# Patient Record
Sex: Male | Born: 1945 | Race: White | Hispanic: No | Marital: Married | Smoking: Former smoker
Health system: Southern US, Community
[De-identification: ages and names within clinical notes are randomized; demographics above are authoritative.]

## PROBLEM LIST (undated history)

## (undated) DIAGNOSIS — I472 Ventricular tachycardia, unspecified: Secondary | ICD-10-CM

## (undated) DIAGNOSIS — E119 Type 2 diabetes mellitus without complications: Secondary | ICD-10-CM

## (undated) DIAGNOSIS — F419 Anxiety disorder, unspecified: Secondary | ICD-10-CM

## (undated) DIAGNOSIS — E785 Hyperlipidemia, unspecified: Secondary | ICD-10-CM

## (undated) DIAGNOSIS — R0902 Hypoxemia: Secondary | ICD-10-CM

## (undated) DIAGNOSIS — J449 Chronic obstructive pulmonary disease, unspecified: Secondary | ICD-10-CM

## (undated) DIAGNOSIS — I10 Essential (primary) hypertension: Secondary | ICD-10-CM

## (undated) DIAGNOSIS — M1990 Unspecified osteoarthritis, unspecified site: Secondary | ICD-10-CM

## (undated) DIAGNOSIS — I471 Supraventricular tachycardia: Secondary | ICD-10-CM

## (undated) DIAGNOSIS — R0600 Dyspnea, unspecified: Secondary | ICD-10-CM

## (undated) DIAGNOSIS — I509 Heart failure, unspecified: Secondary | ICD-10-CM

## (undated) DIAGNOSIS — G7000 Myasthenia gravis without (acute) exacerbation: Secondary | ICD-10-CM

## (undated) HISTORY — DX: Myasthenia gravis without (acute) exacerbation: G70.00

## (undated) HISTORY — PX: CATARACT EXTRACTION: SUR2

## (undated) HISTORY — PX: OTHER SURGICAL HISTORY: SHX169

## (undated) HISTORY — DX: Supraventricular tachycardia: I47.1

## (undated) HISTORY — DX: Ventricular tachycardia, unspecified: I47.20

## (undated) HISTORY — PX: RETINAL DETACHMENT SURGERY: SHX105

## (undated) HISTORY — DX: Hyperlipidemia, unspecified: E78.5

## (undated) HISTORY — DX: Heart failure, unspecified: I50.9

## (undated) HISTORY — DX: Hypoxemia: R09.02

## (undated) HISTORY — PX: TONSILLECTOMY AND ADENOIDECTOMY: SUR1326

## (undated) HISTORY — DX: Anxiety disorder, unspecified: F41.9

## (undated) HISTORY — DX: Essential (primary) hypertension: I10

## (undated) HISTORY — PX: EYE SURGERY: SHX253

## (undated) HISTORY — PX: SKIN GRAFT: SHX250

## (undated) HISTORY — DX: Ventricular tachycardia: I47.2

---

## 2012-04-20 ENCOUNTER — Emergency Department: Payer: Self-pay | Admitting: Emergency Medicine

## 2012-05-08 LAB — HM COLONOSCOPY

## 2014-05-18 DIAGNOSIS — L501 Idiopathic urticaria: Secondary | ICD-10-CM | POA: Insufficient documentation

## 2015-05-07 DIAGNOSIS — Z0000 Encounter for general adult medical examination without abnormal findings: Secondary | ICD-10-CM | POA: Diagnosis not present

## 2015-08-17 ENCOUNTER — Ambulatory Visit (INDEPENDENT_AMBULATORY_CARE_PROVIDER_SITE_OTHER): Payer: Medicare Other | Admitting: Family Medicine

## 2015-08-17 ENCOUNTER — Encounter: Payer: Self-pay | Admitting: Family Medicine

## 2015-08-17 VITALS — BP 138/72 | HR 56 | Temp 98.4°F | Resp 16 | Wt 216.0 lb

## 2015-08-17 DIAGNOSIS — F32A Depression, unspecified: Secondary | ICD-10-CM | POA: Insufficient documentation

## 2015-08-17 DIAGNOSIS — M25511 Pain in right shoulder: Secondary | ICD-10-CM | POA: Diagnosis not present

## 2015-08-17 DIAGNOSIS — F4310 Post-traumatic stress disorder, unspecified: Secondary | ICD-10-CM | POA: Insufficient documentation

## 2015-08-17 DIAGNOSIS — N059 Unspecified nephritic syndrome with unspecified morphologic changes: Secondary | ICD-10-CM | POA: Insufficient documentation

## 2015-08-17 DIAGNOSIS — M79605 Pain in left leg: Secondary | ICD-10-CM | POA: Diagnosis not present

## 2015-08-17 DIAGNOSIS — M79604 Pain in right leg: Secondary | ICD-10-CM | POA: Diagnosis not present

## 2015-08-17 DIAGNOSIS — R413 Other amnesia: Secondary | ICD-10-CM | POA: Insufficient documentation

## 2015-08-17 DIAGNOSIS — F419 Anxiety disorder, unspecified: Secondary | ICD-10-CM | POA: Insufficient documentation

## 2015-08-17 DIAGNOSIS — I10 Essential (primary) hypertension: Secondary | ICD-10-CM | POA: Insufficient documentation

## 2015-08-17 DIAGNOSIS — F17210 Nicotine dependence, cigarettes, uncomplicated: Secondary | ICD-10-CM | POA: Insufficient documentation

## 2015-08-17 DIAGNOSIS — M9979 Connective tissue and disc stenosis of intervertebral foramina of abdomen and other regions: Secondary | ICD-10-CM | POA: Insufficient documentation

## 2015-08-17 DIAGNOSIS — J302 Other seasonal allergic rhinitis: Secondary | ICD-10-CM | POA: Insufficient documentation

## 2015-08-17 DIAGNOSIS — G2581 Restless legs syndrome: Secondary | ICD-10-CM

## 2015-08-17 DIAGNOSIS — K649 Unspecified hemorrhoids: Secondary | ICD-10-CM | POA: Insufficient documentation

## 2015-08-17 DIAGNOSIS — F329 Major depressive disorder, single episode, unspecified: Secondary | ICD-10-CM | POA: Insufficient documentation

## 2015-08-17 DIAGNOSIS — K219 Gastro-esophageal reflux disease without esophagitis: Secondary | ICD-10-CM | POA: Insufficient documentation

## 2015-08-17 MED ORDER — HYDROCODONE-ACETAMINOPHEN 5-325 MG PO TABS: 1.0000 | ORAL_TABLET | Freq: Four times a day (QID) | ORAL | Status: DC | PRN

## 2015-08-17 MED ORDER — ROPINIROLE HCL 0.25 MG PO TABS: 0.2500 mg | ORAL_TABLET | Freq: Every day | ORAL | Status: DC

## 2015-08-17 NOTE — Progress Notes (Signed)
Subjective:    Patient ID: Joseph Macdonald, male    DOB: Feb 09, 1946, 69 y.o.   MRN: [id number]  Leg Pain  The incident occurred more than 1 week ago. There was no injury mechanism. The pain is present in the left leg and right leg. The pain has been constant since onset. Associated symptoms include tingling (Right hand.  ). Pertinent negatives include no inability to bear weight or numbness. The treatment provided no relief.  Shoulder Pain  The pain is present in the right shoulder and right arm. This is a new problem. The current episode started 1 to 4 weeks ago. There has been no history of extremity trauma. The problem occurs constantly. The quality of the pain is described as aching. The pain is at a severity of 5/10 (Can go as high as 10/10 especially when resting. ). The pain is moderate. Associated symptoms include tingling (Right hand.  ). Pertinent negatives include no fever, inability to bear weight, itching, joint locking, joint swelling, limited range of motion, numbness or stiffness. The treatment provided no relief.   Does see doctor at [place].  Previously had pain in other shoulder. Started on Gabapentin and make him feel Zombie like, blurry. Did have HgbA1c checked and was pre-diabetic. Was ok in April.    Had trouble with gabapentin.  Took Tramadol and Naproxen and Gabapentin together.  Slurred speech.  Drunk like.  Went away. No other symptoms.    Was previously on hydrocodone.  Had to sign for prescription. Did not get if was not home. Was a real hassle. So stopped. That is when he switched to Tramadol and Naproxen.     Patient Active Problem List   Diagnosis Date Noted  . Anxiety 08/17/2015  . 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 Clinical depression 08/17/2015  . Acid reflux 08/17/2015  . Hemorrhoid 08/17/2015  . BP (high blood pressure) 08/17/2015  . Bad memory 08/17/2015  . 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 Allergic  rhinitis, seasonal 08/17/2015  . 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 Hypertension Mother   . Thyroid disease Mother   . Kidney disease Mother   . Arthritis Mother   . Stroke Father   . Heart disease Father 91  . Arthritis Father   . Arthritis Other    Social History   Social History  . Marital Status: Married    Spouse Name: N/A  . Number of Children: 1  . Years of Education: H/S   Occupational History  . Retired Building surveyor    Social History Main Topics  . Smoking status: Current Every Day Smoker    Types: Pipe, Cigarettes  . Smokeless tobacco: Former Systems developer  . Alcohol Use: Yes     Comment: Only occasionally  . Drug Use: No  . Sexual Activity: Not on file   Other Topics Concern  . Not on file   Social History Narrative  . No narrative on file   Past Surgical History  Procedure Laterality Date  . Skin graft      Behind left knee  . Tonsillectomy and adenoidectomy     No Known Allergies BP 138/72 mmHg  Pulse 56  Temp(Src) 98.4 F (36.9 C) (Oral)  Resp 16  Wt 216 lb (97.977 kg)    Review of Systems  Constitutional: Positive for fatigue (Pt is not sleeping well secondary to pain.). Negative  for fever, chills, diaphoresis, activity change, appetite change and unexpected weight change.  Respiratory: Negative for apnea, cough, choking, chest tightness, shortness of breath, wheezing and stridor.   Cardiovascular: Positive for leg swelling (ankles swell occasionally). Negative for chest pain and palpitations.  Gastrointestinal: Positive for nausea. Negative for vomiting, abdominal pain, diarrhea, constipation, blood in stool, abdominal distention, anal bleeding and rectal pain.  Endocrine: Negative for cold intolerance, heat intolerance, polydipsia, polyphagia and polyuria.  Musculoskeletal: Positive for myalgias, arthralgias and gait problem. Negative for back pain, joint swelling, stiffness,  neck pain and neck stiffness.  Skin: Negative for itching.  Neurological: Positive for tingling (Right hand.  ) and speech difficulty (Happened Saturday evening but has resolved. ). Negative for dizziness, tremors, seizures, syncope, facial asymmetry, weakness, light-headedness, numbness and headaches.  Psychiatric/Behavioral: Positive for sleep disturbance. Negative for confusion.       Objective:   Physical Exam  Constitutional: He is oriented to person, place, and time. He appears well-developed and well-nourished.  Neurological: He is alert and oriented to person, place, and time.  Psychiatric: He has a normal mood and affect. His behavior is normal. Judgment and thought content normal.   BP 138/72 mmHg  Pulse 56  Temp(Src) 98.4 F (36.9 C) (Oral)  Resp 16  Wt 216 lb (97.977 kg)      Assessment & Plan:   1. Leg pain, bilateral Worsening. Suspect not well controlled on current medication and having significant side effects. Will stop Ultram and Gabapentin and restart hydrocodone. Pain contract signed today.  Will check urine tox screen at follow up ov.    2. Right shoulder pain Treatment as above.  3. Restless leg syndrome New problem. Worsening. Has not been on medication for this previously. Will try Requip and recheck in 4 weeks.    Patient was seen and examined by Jerrell Belfast, MD, and note scribed, in part,  by Ashley Royalty, [place].  I have reviewed the document for accuracy and completeness and I agree with above. Jerrell Belfast, MD   Margarita Rana, MD

## 2015-08-21 ENCOUNTER — Ambulatory Visit (INDEPENDENT_AMBULATORY_CARE_PROVIDER_SITE_OTHER): Payer: Medicare Other

## 2015-08-21 DIAGNOSIS — Z23 Encounter for immunization: Secondary | ICD-10-CM | POA: Diagnosis not present

## 2015-08-26 ENCOUNTER — Other Ambulatory Visit: Payer: Self-pay | Admitting: Family Medicine

## 2015-08-26 MED ORDER — PREDNISONE 10 MG PO TABS: ORAL_TABLET | ORAL | Status: DC

## 2015-08-26 NOTE — Telephone Encounter (Signed)
Pt states he is having swelling and has red places on his hands from being allergic to grass.  Pt is requesting  A Rx for Prednisone 10mg  if possible.  Pt states Dr Venia Minks has given him this before and he does not need an appt.  Joseph Macdonald.  UY#[phone]/[id number]

## 2015-08-26 NOTE — Telephone Encounter (Signed)
Pt advised as directed below.   Thanks,   -Laura  

## 2015-08-26 NOTE — Telephone Encounter (Signed)
Sent rx. Ov if not improved. Thanks.

## 2015-09-17 ENCOUNTER — Ambulatory Visit (INDEPENDENT_AMBULATORY_CARE_PROVIDER_SITE_OTHER): Payer: Medicare Other | Admitting: Family Medicine

## 2015-09-17 ENCOUNTER — Encounter: Payer: Self-pay | Admitting: Family Medicine

## 2015-09-17 VITALS — BP 126/86 | HR 60 | Temp 98.0°F | Resp 16 | Wt 214.0 lb

## 2015-09-17 DIAGNOSIS — R531 Weakness: Secondary | ICD-10-CM | POA: Insufficient documentation

## 2015-09-17 DIAGNOSIS — M79605 Pain in left leg: Secondary | ICD-10-CM

## 2015-09-17 DIAGNOSIS — G7000 Myasthenia gravis without (acute) exacerbation: Secondary | ICD-10-CM

## 2015-09-17 DIAGNOSIS — G2581 Restless legs syndrome: Secondary | ICD-10-CM | POA: Diagnosis not present

## 2015-09-17 DIAGNOSIS — M79604 Pain in right leg: Secondary | ICD-10-CM

## 2015-09-17 DIAGNOSIS — Z7189 Other specified counseling: Secondary | ICD-10-CM | POA: Diagnosis not present

## 2015-09-17 DIAGNOSIS — G8929 Other chronic pain: Secondary | ICD-10-CM

## 2015-09-17 MED ORDER — HYDROCODONE-ACETAMINOPHEN 5-325 MG PO TABS: 1.0000 | ORAL_TABLET | Freq: Four times a day (QID) | ORAL | Status: DC | PRN

## 2015-09-17 NOTE — Progress Notes (Signed)
Patient ID: Joseph Macdonald, male   DO: 1946-07-17, 69 y.o.   MRN: [id number]        Patient: Joseph Macdonald    DOB: November 29, 1946   69 y.o.   MRN: [id number] Visit Date: 09/17/2015  Today's Provider: Margarita Rana, MD   Chief Complaint  Patient presents with  . Leg Pain   Subjective:    HPI    Follow up for restless leg syndrome  The patient was last seen for this 4 weeks ago. Changes made at last visit include Requip.  He reports excellent compliance with treatment. He feels that condition is Improved. He is not having effects.  Feels that this is better.    Follow up for bilateral leg pain  The patient was last seen for this 4 weeks ago. Changes made at last visit include stop Ultram and Gabapentin and restart Hydrocodone. Doing well.   He reports excellent compliance with treatment. He feels that condition is Improved. He is not having side effects.   Thinks he has myasthenia gravis. Blood work was positive.   Has eye droop and difficulty swallowing.  Wanting on his referral to neurology.    No Known Allergies Previous Medications   AMLODIPINE (NORVASC) 5 MG TABLET    Take 5 mg by mouth daily.    HYDROCHLOROTHIAZIDE (MICROZIDE) 12.5 MG CAPSULE    Take 12.5 mg by mouth daily.   HYDROCODONE-ACETAMINOPHEN (NORCO/VICODIN) 5-325 MG PER TABLET    Take 1 tablet by mouth every 6 (six) hours as needed for moderate pain.   MULTIPLE VITAMIN PO    Take 1 tablet by mouth daily.    NAPROXEN (NAPROSYN) 500 MG TABLET    Take 500 mg by mouth 2 (two) times daily with a meal.    OMEGA-3 FATTY ACIDS (OMEGA 3 PO)    Take 1 capsule by mouth daily.    PREDNISONE (DELTASONE) 10 MG TABLET    6 po for 1 day and then 5 po for 1 day and then 4 po for 1 day and 3 po for 1 day and then 2 po for 1 day and then 1 po for 1 day.   RANITIDINE (ZANTAC) 150 MG CAPSULE    Take 150 mg by mouth every evening.    ROPINIROLE (REQUIP) 0.25 MG TABLET    Take 1 tablet (0.25 mg total) by mouth at  bedtime. 1 a night for one week and then 2 at night.   VITAMIN B-12 (CYANOCOBALAMIN) 1000 MCG TABLET    Take 1,000 mcg by mouth daily.    Review of Systems  Constitutional: Negative.   Respiratory: Negative.   Cardiovascular: Negative.   Gastrointestinal: Negative.   Genitourinary: Negative.   Neurological: Negative.   Psychiatric/Behavioral: Negative.     Social History  Substance Use Topics  . Smoking status: Current Every Day Smoker    Types: Pipe, Cigarettes  . Smokeless tobacco: Former Systems developer  . Alcohol Use: Yes     Comment: Only occasionally   Objective:   BP 126/86 mmHg  Pulse 60  Temp(Src) 98 F (36.7 C) (Oral)  Resp 16  Wt 214 lb (97.07 kg)  Physical Exam  Constitutional: He is oriented to person, place, and time. He appears well-developed and well-nourished.  Neurological: He is alert and oriented to person, place, and time.  Has right eye drooping and some slurred speech.    Psychiatric: He has a normal mood and affect.      Assessment & Plan:  1. Restless leg syndrome Stable.  Continue current medication and plan of care.   2. Leg pain, bilateral Taking  Hydrocodone twice a day.  Will refill today.  Did leave urine for drug screen today.    3. Encounter for chronic pain management Left sample today.  - Pain Management Screening Profile (10S)  4. Myasthenia gravis ([place]) Continue work up and  treatment at [place].      Margarita Rana, MD  [facility]

## 2015-09-18 LAB — PMP SCREEN PROFILE (10S), URINE
Amphetamine Screen, Ur: NEGATIVE ng/mL
Barbiturate Screen, Ur: NEGATIVE ng/mL
Benzodiazepine Screen, Urine: NEGATIVE ng/mL
Cannabinoids Ur Ql Scn: NEGATIVE ng/mL
Cocaine(Metab.)Screen, Urine: NEGATIVE ng/mL
Creatinine(Crt), U: 71.9 mg/dL (ref 20.0–300.0)
Methadone Scn, Ur: NEGATIVE ng/mL
Opiate Scrn, Ur: POSITIVE ng/mL
Oxycodone+Oxymorphone Ur Ql Scn: NEGATIVE ng/mL
PCP Scrn, Ur: NEGATIVE ng/mL
Ph of Urine: 6.8 (ref 4.5–8.9)
Propoxyphene, Screen: NEGATIVE ng/mL

## 2015-09-20 ENCOUNTER — Telehealth: Payer: Self-pay

## 2015-09-20 NOTE — Telephone Encounter (Signed)
Advised pt of lab results. Pt verbally acknowledges understanding. Yazmen Briones Drozdowski, CMA   

## 2015-09-20 NOTE — Telephone Encounter (Signed)
-----   Message from Margarita Rana, MD sent at 09/18/2015 12:02 PM EDT ----- Tox screen as expected.  Thanks.

## 2015-09-20 NOTE — Telephone Encounter (Signed)
LMTCB 09/20/2015   Thanks,   -Mickel Baas

## 2015-10-12 ENCOUNTER — Other Ambulatory Visit: Payer: Self-pay | Admitting: Physician Assistant

## 2015-10-12 DIAGNOSIS — K769 Liver disease, unspecified: Secondary | ICD-10-CM

## 2015-10-20 ENCOUNTER — Ambulatory Visit
Admission: RE | Admit: 2015-10-20 | Discharge: 2015-10-20 | Disposition: A | Discharge status: Home / Self Care | Payer: Non-veteran care | Source: Ambulatory Visit | Attending: Physician Assistant | Admitting: Physician Assistant

## 2015-10-20 DIAGNOSIS — K769 Liver disease, unspecified: Secondary | ICD-10-CM

## 2015-10-20 MED ORDER — GADOBENATE DIMEGLUMINE 529 MG/ML IV SOLN
20.0000 mL | Freq: Once | INTRAVENOUS | Status: AC | PRN
  Administered 2015-10-20: 20 mL via INTRAVENOUS

## 2015-12-21 LAB — BASIC METABOLIC PANEL
Creatinine: 0.9 mg/dL (ref 0.6–1.3)
Glucose: 142 mg/dL

## 2015-12-21 LAB — LIPID PANEL
CHOLESTEROL: 171 mg/dL (ref 0–200)
HDL: 89 mg/dL — AB (ref 35–70)

## 2015-12-21 LAB — HEMOGLOBIN A1C: HEMOGLOBIN A1C: 6.3

## 2016-01-17 ENCOUNTER — Telehealth: Payer: Self-pay | Admitting: Family Medicine

## 2016-01-17 ENCOUNTER — Encounter: Payer: Self-pay | Admitting: Family Medicine

## 2016-01-17 ENCOUNTER — Ambulatory Visit (INDEPENDENT_AMBULATORY_CARE_PROVIDER_SITE_OTHER): Payer: PPO | Admitting: Family Medicine

## 2016-01-17 VITALS — BP 136/74 | HR 68 | Temp 98.0°F | Resp 20 | Wt 216.0 lb

## 2016-01-17 DIAGNOSIS — F1190 Opioid use, unspecified, uncomplicated: Secondary | ICD-10-CM | POA: Insufficient documentation

## 2016-01-17 DIAGNOSIS — M9979 Connective tissue and disc stenosis of intervertebral foramina of abdomen and other regions: Secondary | ICD-10-CM | POA: Diagnosis not present

## 2016-01-17 DIAGNOSIS — B356 Tinea cruris: Secondary | ICD-10-CM | POA: Diagnosis not present

## 2016-01-17 MED ORDER — HYDROCODONE-ACETAMINOPHEN 5-325 MG PO TABS: 1.0000 | ORAL_TABLET | Freq: Four times a day (QID) | ORAL | Status: DC | PRN

## 2016-01-17 MED ORDER — TERBINAFINE HCL 1 % EX CREA: 1.0000 "application " | TOPICAL_CREAM | Freq: Two times a day (BID) | CUTANEOUS | Status: DC

## 2016-01-17 NOTE — Telephone Encounter (Signed)
Added diagnosis for urine tox screen.

## 2016-01-17 NOTE — Progress Notes (Signed)
Subjective:    Patient ID: Joseph Macdonald, male    DOB: [birth year], 70 y.o.   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 The problem has been gradually worsening since onset. The affected locations include the groin. The rash is characterized by itchiness and redness. He was exposed to nothing. Pertinent negatives include no anorexia, cough, diarrhea, fatigue, fever, joint pain, nail changes, shortness of breath or vomiting. Treatments tried: Tinactin cream. The treatment provided mild relief.      Review of Systems  Constitutional: Negative for fever and fatigue.  Respiratory: Negative for cough and shortness of breath.   Gastrointestinal: Negative for vomiting, diarrhea and anorexia.  Musculoskeletal: Negative for joint pain.  Skin: Positive for rash. Negative for nail changes.   BP 136/74 mmHg  Pulse 68  Temp(Src) 98 F (36.7 C) (Oral)  Resp 20  Wt 216 lb (97.977 kg)   Patient Active Problem List   Diagnosis Date Noted  . Myasthenia gravis ([place]) 09/17/2015  . Anxiety 08/17/2015  . 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 Clinical depression 08/17/2015  . Acid reflux 08/17/2015  . Hemorrhoid 08/17/2015  . BP (high blood pressure) 08/17/2015  . Bad memory 08/17/2015  . 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 Allergic rhinitis, seasonal 08/17/2015  . 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 Restless leg syndrome 08/17/2015   No past medical history on file. Current Outpatient Prescriptions on File Prior to Visit  Medication Sig  . amLODipine (NORVASC) 5 MG tablet Take 5 mg by mouth daily.   . hydrochlorothiazide (MICROZIDE) 12.5 MG capsule Take 25 mg by mouth daily.   . ranitidine (ZANTAC) 150 MG capsule Take 150 mg by mouth every evening.   . vitamin B-12 (CYANOCOBALAMIN) 1000 MCG tablet Take 1,000 mcg by mouth daily.   No current  facility-administered medications on file prior to visit.   No Known Allergies Past Surgical History  Procedure Laterality Date  . Skin graft      Behind left knee  . Tonsillectomy and adenoidectomy     Social History   Social History  . Marital Status: Married    Spouse Name: N/A  . Number of Children: 1  . Years of Education: H/S   Occupational History  . Retired Building surveyor    Social History Main Topics  . Smoking status: Current Every Day Smoker -- 0.25 packs/day    Types: Pipe, Cigarettes  . Smokeless tobacco: Former Systems developer  . Alcohol Use: Yes     Comment: Only occasionally  . Drug Use: No  . Sexual Activity: Not on file   Other Topics Concern  . Not on file   Social History Narrative   Family History  Problem Relation Age of Onset  . Hypertension Mother   . Thyroid disease Mother   . Kidney disease Mother   . Arthritis Mother   . Stroke Father   . Heart disease Father 88  . Arthritis Father   . Arthritis Other        Objective:   Physical Exam  Constitutional: He is oriented to person, place, and time. He appears well-developed and well-nourished.  Neurological: He is alert and oriented to person, place, and time.  Skin: Rash noted.  Area of hyperpigmentation in inguinal region with sharp edges. Some satellite lesions.    Psychiatric: He has a normal mood and affect.  His behavior is normal.   BP 136/74 mmHg  Pulse 68  Temp(Src) 98 F (36.7 C) (Oral)  Resp 20  Wt 216 lb (97.977 kg)     Assessment & Plan:  1. Tinea cruris Worsening. Failed Tinactin. Will try Lamisil as below. May need to consider oral antifungals if Lamisil fails. - terbinafine (LAMISIL) 1 % cream; Apply 1 application topically 2 (two) times daily.  Dispense: 42 g; Refill: 2  2. Narrowing of intervertebral disc space Stable. Refill medications as below. - HYDROcodone-acetaminophen (NORCO/VICODIN) 5-325 MG tablet; Take 1 tablet by mouth every 6 (six) hours as needed  for moderate pain.  Dispense: 120 tablet; Refill: 0   Patient seen and examined by Jerrell Belfast, MD, and note scribed by Renaldo Fiddler, CMA.   I have reviewed the document for accuracy and completeness and I agree with above. Jerrell Belfast, MD    Margarita Rana, MD

## 2016-05-15 ENCOUNTER — Encounter: Payer: Self-pay | Admitting: Family Medicine

## 2016-05-15 ENCOUNTER — Ambulatory Visit (INDEPENDENT_AMBULATORY_CARE_PROVIDER_SITE_OTHER): Payer: PPO | Admitting: Family Medicine

## 2016-05-15 VITALS — BP 110/60 | HR 60 | Temp 98.0°F | Resp 16 | Ht 65.75 in | Wt 216.0 lb

## 2016-05-15 DIAGNOSIS — Z0000 Encounter for general adult medical examination without abnormal findings: Secondary | ICD-10-CM | POA: Diagnosis not present

## 2016-05-15 DIAGNOSIS — M9979 Connective tissue and disc stenosis of intervertebral foramina of abdomen and other regions: Secondary | ICD-10-CM | POA: Diagnosis not present

## 2016-05-15 DIAGNOSIS — G2581 Restless legs syndrome: Secondary | ICD-10-CM

## 2016-05-15 MED ORDER — HYDROCODONE-ACETAMINOPHEN 5-325 MG PO TABS: 1.0000 | ORAL_TABLET | Freq: Four times a day (QID) | ORAL | Status: DC | PRN

## 2016-05-15 MED ORDER — CLONAZEPAM 0.5 MG PO TABS: 0.5000 mg | ORAL_TABLET | Freq: Every day | ORAL | Status: DC

## 2016-05-15 NOTE — Progress Notes (Signed)
Patient ID: Joseph Macdonald, male   DOB: [birth year], 70 y.o.   MRN: [id number]       Patient: Joseph Macdonald, Male    DOB: [birth year], 70 y.o.   MRN: [id number] Visit Date: 05/15/2016  Today's Provider: Margarita Rana, MD   Chief Complaint  Patient presents with  . Medicare Wellness   Subjective:    Annual wellness visit [name] is a 70 y.o. male. He feels well. He reports exercising 2 days a week. He reports he is sleeping fairly well. Brought his labs from [place] for review today.   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 Helps pain. No problems with addiction or overuse.  Does also want to discuss RLS. Requip did not help at all.  Has an ache and has to move his legs.  Can not really describe it. Also depression and PTSD under good control.   Review of Systems  Constitutional: Negative.   HENT: Positive for rhinorrhea and sinus pressure.   Eyes: Negative.   Respiratory: Negative.   Cardiovascular: Negative.   Gastrointestinal: Positive for diarrhea.  Endocrine: Positive for polyuria.  Genitourinary: Negative.   Musculoskeletal: Positive for back pain.  Skin: Negative.   Allergic/Immunologic: Positive for environmental allergies.  Neurological: Negative.   Hematological: Bruises/bleeds easily.  Psychiatric/Behavioral: Negative.     Social History   Social History  . Marital Status: Married    Spouse Name: N/A  . Number of Children: 1  . Years of Education: H/S   Occupational History  . Retired Building surveyor    Social History Main Topics  . Smoking status: Current Every Day Smoker -- 0.25 packs/day    Types: Pipe, Cigarettes  . Smokeless tobacco: Former Systems developer  . Alcohol Use: Yes     Comment: Only occasionally  . Drug Use: No  . Sexual Activity: Not on file   Other Topics Concern  . Not on file   Social History Narrative     History reviewed. No pertinent past medical history.   Patient Active Problem List   Diagnosis Date Noted  . Chronic narcotic use 01/17/2016  . Myasthenia gravis ([place]) 09/17/2015  . Anxiety 08/17/2015  . 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 Clinical depression 08/17/2015  . Acid reflux 08/17/2015  . Hemorrhoid 08/17/2015  . BP (high blood pressure) 08/17/2015  . Bad memory 08/17/2015  . 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 Allergic rhinitis, seasonal 08/17/2015  . 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 Restless leg syndrome 08/17/2015    Past Surgical History  Procedure Laterality Date  . Skin graft      Behind left knee  . Tonsillectomy and adenoidectomy      His family history includes Arthritis in his father, mother, and other; Heart disease (age of onset: 82) in his father; Hypertension in his mother; Kidney disease in his mother; Stroke in his father; Thyroid disease in his mother.    Previous Medications   AMLODIPINE (NORVASC) 5 MG TABLET    Take 5 mg by mouth daily.    HYDROCHLOROTHIAZIDE (MICROZIDE) 12.5 MG CAPSULE    Take 25 mg by mouth daily.    HYDROCODONE-ACETAMINOPHEN (NORCO/VICODIN) 5-325 MG TABLET    Take 1 tablet by mouth every 6 (six) hours as needed for moderate pain.   PREDNISONE (DELTASONE) 20 MG TABLET    Take 30 mg  by mouth daily with breakfast.   PYRIDOSTIGMINE (MESTINON) 60 MG TABLET    Take 120 mg by mouth daily.   RANITIDINE (ZANTAC) 150 MG CAPSULE    Take 150 mg by mouth every evening.    TERBINAFINE (LAMISIL) 1 % CREAM    Apply 1 application topically 2 (two) times daily.   TOLNAFTATE (TINACTIN) 1 % CREAM    Apply 1 application topically 2 (two) times daily.   VITAMIN B-12 (CYANOCOBALAMIN) 1000 MCG TABLET    Take 1,000 mcg by mouth daily.    Patient Care Team: Margarita Rana, MD as PCP - General (Family Medicine)     Objective:   Vitals: BP 110/60 mmHg  Pulse 60  Temp(Src) 98 F  (36.7 C) (Oral)  Resp 16  Ht 5' 5.75" (1.67 m)  Wt 216 lb (97.977 kg)  BMI 35.13 kg/m2  SpO2 95%  Physical Exam  Constitutional: He is oriented to person, place, and time. He appears well-developed and well-nourished.  HENT:  Head: Normocephalic and atraumatic.  Right Ear: External ear normal.  Nose: Nose normal.  Mouth/Throat: Oropharynx is clear and moist.  Eyes: Conjunctivae are normal. Pupils are equal, round, and reactive to light.  Neck: Normal range of motion. Neck supple.  Cardiovascular: Normal rate, regular rhythm and normal heart sounds.   Pulmonary/Chest: Effort normal and breath sounds normal.  Abdominal: Soft. Bowel sounds are normal.  Musculoskeletal: Normal range of motion.  Neurological: He is alert and oriented to person, place, and time.  Skin: Skin is warm and dry.  Psychiatric: He has a normal mood and affect. His behavior is normal. Judgment and thought content normal.    Activities of Daily Living In your present state of health, do you have any difficulty performing the following activities: 05/15/2016  Hearing? N  Vision? N  Difficulty concentrating or making decisions? N  Walking or climbing stairs? N  Dressing or bathing? N  Doing errands, shopping? N    Fall Risk Assessment Fall Risk  05/15/2016  Falls in the past year? No     Depression Screen PHQ 2/9 Scores 05/15/2016  PHQ - 2 Score 0    Cognitive Testing - 6-CIT  Correct? Score   What year is it? yes 0 0 or 4  What month is it? yes 0 0 or 3  Memorize:    Joseph Macdonald,  43,  [address],  [place],      What time is it? (within 1 hour) yes 0 0 or 3  Count backwards from 20 yes 0 0, 2, or 4  Name the months of the year yes 0 0, 2, or 4  Repeat name & address above yes 0 0, 2, 4, 6, 8, or 10       TOTAL SCORE  0/28   Interpretation:  Normal  Normal (0-7) Abnormal (8-28)       Assessment & Plan:     Annual Wellness Visit  Reviewed patient's Family Medical History Reviewed and  updated list of patient's medical providers Assessment of cognitive impairment was done Assessed patient's functional ability Established a written schedule for health screening [place] Completed and Reviewed  Exercise Activities and Dietary recommendations Goals    None      Immunization History  Administered Date(s) Administered  . Influenza, High Dose Seasonal PF 08/21/2015      1. Medicare annual wellness visit, subsequent Stable. Patient advised to continue eating healthy and exercise daily.  2. Restless leg  syndrome Recurrent. Worsening. Patient started on clonazepam 0.5 mg as below.  Patient instructed to call back if condition worsens or does not improve.    - clonazePAM (KLONOPIN) 0.5 MG tablet; Take 1 tablet (0.5 mg total) by mouth daily.  Dispense: 90 tablet; Refill: 1  3. Narrowing of intervertebral disc space Stable. Patient to continue current medication and plan of care. Patient to follow-up in 3 months with Dr. Caryn Section. - HYDROcodone-acetaminophen (NORCO/VICODIN) 5-325 MG tablet; Take 1 tablet by mouth every 6 (six) hours as needed for moderate pain.  Dispense: 120 tablet; Refill: 0   Patient seen and examined by Dr. Jerrell Belfast, and note scribed by Philbert Riser. [place], CMA.  I have reviewed the document for accuracy and completeness and I agree with above. Jerrell Belfast, MD   Margarita Rana, MD   ------------------------------------------------------------------------------------------------------------

## 2016-05-16 ENCOUNTER — Encounter: Payer: Self-pay | Admitting: Family Medicine

## 2016-09-04 ENCOUNTER — Ambulatory Visit: Payer: Self-pay | Admitting: Family Medicine

## 2016-09-12 ENCOUNTER — Encounter: Payer: Self-pay | Admitting: Family Medicine

## 2016-09-12 ENCOUNTER — Ambulatory Visit (INDEPENDENT_AMBULATORY_CARE_PROVIDER_SITE_OTHER): Payer: PPO | Admitting: Family Medicine

## 2016-09-12 VITALS — BP 112/70 | HR 67 | Temp 99.4°F | Resp 18 | Wt 221.0 lb

## 2016-09-12 DIAGNOSIS — G2581 Restless legs syndrome: Secondary | ICD-10-CM

## 2016-09-12 DIAGNOSIS — M9979 Connective tissue and disc stenosis of intervertebral foramina of abdomen and other regions: Secondary | ICD-10-CM | POA: Diagnosis not present

## 2016-09-12 DIAGNOSIS — Z23 Encounter for immunization: Secondary | ICD-10-CM

## 2016-09-12 MED ORDER — HYDROCODONE-ACETAMINOPHEN 5-325 MG PO TABS: 1.0000 | ORAL_TABLET | Freq: Four times a day (QID) | ORAL | 0 refills | Status: DC | PRN

## 2016-09-12 MED ORDER — CLONAZEPAM 0.5 MG PO TABS: 0.5000 mg | ORAL_TABLET | Freq: Every day | ORAL | 1 refills | Status: DC

## 2016-09-12 NOTE — Progress Notes (Signed)
Patient: Joseph Macdonald Male    DOB: 10-01-1946   70 y.o.   MRN: [id number] Visit Date: 09/12/2016  Today's Provider: Lelon Huh, MD   Chief Complaint  Patient presents with  . Follow-up   Subjective:    HPI  This is a previous patient of Dr. Venia Minks present today as new patient to me to establish care and follow up on chronic medical problems.   Follow up Narrowing of intervertebral disc space:  Patient was last seen for this problem 4 months ago by Dr. Venia Minks. Patient was stable during that visit and was advised to continue current medication and plan of care. Patient reports good compliance with treatment.  Follow up Restless leg syndrome:  Patient was last seen for this problem 4 months ago. Patient was started on Clonazepam due to recurrent and worsening symptoms. Patient reports good compliance with treatment, good tolerance and good symptom control.   On prednisone 20mg  for myasthenia gravis. Anticipates being on current dose of prednisone until at least January.   Diabetes is followed at [facility] and reports recent A1c was 6.7.       No Known Allergies   Current Outpatient Prescriptions:  .  amLODipine (NORVASC) 5 MG tablet, Take 5 mg by mouth daily. , Disp: , Rfl:  .  clonazePAM (KLONOPIN) 0.5 MG tablet, Take 1 tablet (0.5 mg total) by mouth daily., Disp: 90 tablet, Rfl: 1 .  hydrochlorothiazide (MICROZIDE) 12.5 MG capsule, Take 25 mg by mouth daily. , Disp: , Rfl:  .  HYDROcodone-acetaminophen (NORCO/VICODIN) 5-325 MG tablet, Take 1 tablet by mouth every 6 (six) hours as needed for moderate pain., Disp: 120 tablet, Rfl: 0 .  predniSONE (DELTASONE) 20 MG tablet, Take 20 mg by mouth daily with breakfast. , Disp: , Rfl:  .  pyridostigmine (MESTINON) 60 MG tablet, Take 120 mg by mouth daily., Disp: , Rfl:  .  ranitidine (ZANTAC) 150 MG capsule, Take 150 mg by mouth every evening. , Disp: , Rfl:  .  terbinafine (LAMISIL) 1 % cream, Apply 1 application topically 2  (two) times daily., Disp: 42 g, Rfl: 2 .  tolnaftate (TINACTIN) 1 % cream, Apply 1 application topically 2 (two) times daily., Disp: , Rfl:  .  vitamin B-12 (CYANOCOBALAMIN) 1000 MCG tablet, Take 1,000 mcg by mouth daily., Disp: , Rfl:   Review of Systems  Constitutional: Negative for appetite change, chills and fever.  Respiratory: Negative for chest tightness, shortness of breath and wheezing.   Cardiovascular: Negative for chest pain and palpitations.  Gastrointestinal: Negative for abdominal pain, nausea and vomiting.  Musculoskeletal: Positive for arthralgias and myalgias (in legs; improved since last visit).    Social History  Substance Use Topics  . Smoking status: Current Every Day Smoker    Packs/day: 0.25    Types: Pipe, Cigarettes  . Smokeless tobacco: Former Systems developer  . Alcohol use Yes     Comment: Only occasionally   Objective:   BP 112/70 (BP Location: Right Arm, Patient Position: Sitting, Cuff Size: Large)   Pulse 67   Temp 99.4 F (37.4 C) (Oral)   Resp 18   Wt 221 lb (100.2 kg)   SpO2 95% Comment: room air  BMI 35.94 kg/m   Physical Exam   General Appearance:    Alert, cooperative, no distress  Eyes:    PERRL, conjunctiva/corneas clear, EOM's intact       Lungs:     Clear to auscultation bilaterally, respirations unlabored  Heart:    Regular rate and rhythm  Neurologic:   Awake, alert, oriented x 3. No apparent focal neurological           defect.           Assessment & Plan:     1. Restless leg syndrome Doing well with clonazepam - clonazePAM (KLONOPIN) 0.5 MG tablet; Take 1 tablet (0.5 mg total) by mouth daily.  Dispense: 90 tablet; Refill: 1  2. Narrowing of intervertebral disc space Pain adequately controlled. Continue current medications.   - HYDROcodone-acetaminophen (NORCO/VICODIN) 5-325 MG tablet; Take 1 tablet by mouth every 6 (six) hours as needed for moderate pain.  Dispense: 120 tablet; Refill: 0  3. Need for influenza vaccination  -  Flu vaccine HIGH DOSE PF (Fluzone High dose)        Lelon Huh, MD  [facility]

## 2016-12-26 ENCOUNTER — Other Ambulatory Visit: Payer: Self-pay | Admitting: Family Medicine

## 2016-12-26 DIAGNOSIS — M9979 Connective tissue and disc stenosis of intervertebral foramina of abdomen and other regions: Secondary | ICD-10-CM

## 2016-12-26 MED ORDER — HYDROCODONE-ACETAMINOPHEN 5-325 MG PO TABS: 1.0000 | ORAL_TABLET | Freq: Four times a day (QID) | ORAL | 0 refills | Status: DC | PRN

## 2017-03-19 ENCOUNTER — Ambulatory Visit (INDEPENDENT_AMBULATORY_CARE_PROVIDER_SITE_OTHER): Payer: PPO | Admitting: Family Medicine

## 2017-03-19 ENCOUNTER — Encounter: Payer: Self-pay | Admitting: Family Medicine

## 2017-03-19 VITALS — BP 132/70 | HR 58 | Temp 98.6°F | Resp 16 | Ht 65.75 in | Wt 220.0 lb

## 2017-03-19 DIAGNOSIS — F17210 Nicotine dependence, cigarettes, uncomplicated: Secondary | ICD-10-CM | POA: Diagnosis not present

## 2017-03-19 DIAGNOSIS — G2581 Restless legs syndrome: Secondary | ICD-10-CM

## 2017-03-19 DIAGNOSIS — R7303 Prediabetes: Secondary | ICD-10-CM | POA: Diagnosis not present

## 2017-03-19 DIAGNOSIS — I10 Essential (primary) hypertension: Secondary | ICD-10-CM

## 2017-03-19 DIAGNOSIS — M9979 Connective tissue and disc stenosis of intervertebral foramina of abdomen and other regions: Secondary | ICD-10-CM | POA: Diagnosis not present

## 2017-03-19 MED ORDER — HYDROCODONE-ACETAMINOPHEN 5-325 MG PO TABS: 1.0000 | ORAL_TABLET | Freq: Four times a day (QID) | ORAL | 0 refills | Status: DC | PRN

## 2017-03-19 MED ORDER — CLONAZEPAM 0.5 MG PO TABS: 0.5000 mg | ORAL_TABLET | Freq: Every day | ORAL | 1 refills | Status: DC

## 2017-03-19 NOTE — Progress Notes (Signed)
Patient: Joseph Macdonald Male    DOB: [birth year]   71 y.o.   MRN: [id number] Visit Date: 03/19/2017  Today's Provider: Lelon Huh, MD   Chief Complaint  Patient presents with  . Follow-up   Subjective:    HPI Follow up Narrowing of intervertebral disc space: Patient presentsin today for a follow up. He was last seen in the office about 6 months ago. No changes were made in his medications.he continues on hydrocodone at least three and often four times a day which is working well.   Follow up Restless leg syndrome:  Patient presents today for a follow up. He was seen in the office 6 months ago. Patient currently takes clonazepam for his symptoms. He reports good compliance and good symptom control.   Follow up hypertension. Continues on daily amlodipine and hctz which he is tolerating well. He reports he had labs at [place] in January which were normal except for a1c in pre-diabetic range. He is due to follow up at [facility] in July to have this rechecked.  Marland Kitchen BMP Latest Ref Rng & Units 12/21/2015  Creatinine 0.6 - 1.3 mg/dL 0.9     No Known Allergies   Current Outpatient Prescriptions:  .  amLODipine (NORVASC) 5 MG tablet, Take 5 mg by mouth daily. , Disp: , Rfl:  .  clonazePAM (KLONOPIN) 0.5 MG tablet, Take 1 tablet (0.5 mg total) by mouth daily., Disp: 90 tablet, Rfl: 1 .  hydrochlorothiazide (MICROZIDE) 12.5 MG capsule, Take 25 mg by mouth daily. , Disp: , Rfl:  .  HYDROcodone-acetaminophen (NORCO/VICODIN) 5-325 MG tablet, Take 1 tablet by mouth every 6 (six) hours as needed for moderate pain., Disp: 120 tablet, Rfl: 0 .  predniSONE (DELTASONE) 20 MG tablet, Take 20 mg by mouth daily with breakfast. , Disp: , Rfl:  .  pyridostigmine (MESTINON) 60 MG tablet, Take 120 mg by mouth daily., Disp: , Rfl:  .  ranitidine (ZANTAC) 150 MG capsule, Take 150 mg by mouth every evening. , Disp: , Rfl:  .  terbinafine (LAMISIL) 1 % cream, Apply 1 application topically 2 (two) times daily.,  Disp: 42 g, Rfl: 2 .  tolnaftate (TINACTIN) 1 % cream, Apply 1 application topically 2 (two) times daily., Disp: , Rfl:  .  vitamin B-12 (CYANOCOBALAMIN) 1000 MCG tablet, Take 1,000 mcg by mouth daily., Disp: , Rfl:   Review of Systems  Constitutional: Negative.   Cardiovascular: Negative.   Musculoskeletal: Positive for arthralgias and myalgias.  Neurological: Negative.     Social History  Substance Use Topics  . Smoking status: Current Every Day Smoker    Packs/day: 0.25    Types: Pipe, Cigarettes  . Smokeless tobacco: Former Systems developer  . Alcohol use Yes     Comment: Only occasionally   Objective:   BP 132/70 (BP Location: Left Arm, Patient Position: Sitting, Cuff Size: Normal)   Pulse (!) 58   Temp 98.6 F (37 C)   Resp 16   Ht 5' 5.75" (1.67 m)   Wt 220 lb (99.8 kg)   BMI 35.78 kg/m  Vitals:   03/19/17 1105  BP: 132/70  Pulse: (!) 58  Resp: 16  Temp: 98.6 F (37 C)  Weight: 220 lb (99.8 kg)  Height: 5' 5.75" (1.67 m)     Physical Exam   General Appearance:    Alert, cooperative, no distress  Eyes:    PERRL, conjunctiva/corneas clear, EOM's intact       Lungs:  Clear to auscultation bilaterally, respirations unlabored  Heart:    Regular rate and rhythm  Neurologic:   Awake, alert, oriented x 3. No apparent focal neurological           defect.           Assessment & Plan:     1. Essential hypertension Stable. Continue routine follow up at [place].   2. Narrowing of intervertebral disc space Still with daily pain, but manageable. Continue current medication regiment.  - HYDROcodone-acetaminophen (NORCO/VICODIN) 5-325 MG tablet; Take 1 tablet by mouth every 6 (six) hours as needed for moderate pain.  Dispense: 120 tablet; Refill: 0  3. Pre-diabetes Stable, continue periodic a1c checks at [facility].   4. Restless leg syndrome Well controlled on qhs clonazepam.  - clonazePAM (KLONOPIN) 0.5 MG tablet; Take 1 tablet (0.5 mg total) by mouth daily.  Dispense: 90  tablet; Refill: 1  5. Smoking greater than 30 pack years He mostly uses pipe now. Continue to work on quitting and continue yearly lung CT at [place].   Return in about 6 months (around 09/18/2017).        Lelon Huh, MD  [facility]

## 2017-07-25 ENCOUNTER — Other Ambulatory Visit: Payer: Self-pay | Admitting: Family Medicine

## 2017-07-25 DIAGNOSIS — M9979 Connective tissue and disc stenosis of intervertebral foramina of abdomen and other regions: Secondary | ICD-10-CM

## 2017-07-25 MED ORDER — HYDROCODONE-ACETAMINOPHEN 5-325 MG PO TABS: 1.0000 | ORAL_TABLET | Freq: Four times a day (QID) | ORAL | 0 refills | Status: DC | PRN

## 2017-08-28 ENCOUNTER — Ambulatory Visit (INDEPENDENT_AMBULATORY_CARE_PROVIDER_SITE_OTHER): Payer: PPO | Admitting: Family Medicine

## 2017-08-28 ENCOUNTER — Encounter: Payer: Self-pay | Admitting: Family Medicine

## 2017-08-28 VITALS — BP 130/64 | HR 60 | Temp 98.0°F | Resp 16 | Ht 66.0 in | Wt 216.0 lb

## 2017-08-28 DIAGNOSIS — I10 Essential (primary) hypertension: Secondary | ICD-10-CM | POA: Diagnosis not present

## 2017-08-28 DIAGNOSIS — G2581 Restless legs syndrome: Secondary | ICD-10-CM | POA: Diagnosis not present

## 2017-08-28 DIAGNOSIS — R7303 Prediabetes: Secondary | ICD-10-CM

## 2017-08-28 DIAGNOSIS — Z23 Encounter for immunization: Secondary | ICD-10-CM | POA: Diagnosis not present

## 2017-08-28 MED ORDER — PRAMIPEXOLE DIHYDROCHLORIDE 0.125 MG PO TABS: ORAL_TABLET | ORAL | 1 refills | Status: DC

## 2017-08-28 NOTE — Patient Instructions (Signed)
   [facility] if [facility] have Pneum[facility]-23.  If not then you need to get one.

## 2017-08-28 NOTE — Progress Notes (Signed)
Patient: Joseph Macdonald Male    DOB: September 26, 1946   71 y.o.   MRN: [id number] Visit Date: 08/28/2017  Today's Provider: Lelon Huh, MD   Chief Complaint  Patient presents with  . Follow-up  . Diabetes   Subjective:    HPI  Hypertension, follow-up:  BP Readings from Last 3 Encounters:  08/28/17 130/64  03/19/17 132/70  09/12/16 112/70    Continues to have medications refilled from [place], is tolerating well without adverse effects.     Weight trend:  Wt Readings from Last 3 Encounters:  08/28/17 216 lb (98 kg)  03/19/17 220 lb (99.8 kg)  09/12/16 221 lb (100.2 kg)     ------------------------------------------------------------------------ Follow up of Narrowing of intervertebral disc space:  Patient was last seen for this problem 5 months ago and no changes were made. Patient reports good compliance with treatment, good tolerance and fair symptom control. He is doing well with current pain medication regiment.   Follow up of Restless legs syndrome:  Patient was last seen for this problem 5 months ago and no changes were made. He has been taking clonazepam every night which seemed to help at first, but not anymore, and is having a great deal of trouble sleeping.   Follow up of Pre-Diabetes:  Patient was last seen for this problem 5 months ago and no changes were made. This problem is managed by the [place]. He states his last A1c in July was 6.8%    No Known Allergies   Current Outpatient Prescriptions:  .  amLODipine (NORVASC) 5 MG tablet, Take 5 mg by mouth daily. , Disp: , Rfl:  .  clonazePAM (KLONOPIN) 0.5 MG tablet, Take 1 tablet (0.5 mg total) by mouth daily., Disp: 90 tablet, Rfl: 1 .  hydrochlorothiazide (MICROZIDE) 12.5 MG capsule, Take 25 mg by mouth daily. , Disp: , Rfl:  .  HYDROcodone-acetaminophen (NORCO/VICODIN) 5-325 MG tablet, Take 1 tablet by mouth every 6 (six) hours as needed for moderate pain., Disp: 120 tablet, Rfl: 0 .  predniSONE  (DELTASONE) 20 MG tablet, Take 20 mg by mouth every other day. , Disp: , Rfl:  .  pyridostigmine (MESTINON) 60 MG tablet, Take 120 mg by mouth daily., Disp: , Rfl:  .  ranitidine (ZANTAC) 150 MG capsule, Take 150 mg by mouth every evening. , Disp: , Rfl:  .  terbinafine (LAMISIL) 1 % cream, Apply 1 application topically 2 (two) times daily., Disp: 42 g, Rfl: 2 .  tolnaftate (TINACTIN) 1 % cream, Apply 1 application topically 2 (two) times daily., Disp: , Rfl:  .  vitamin B-12 (CYANOCOBALAMIN) 1000 MCG tablet, Take 1,000 mcg by mouth daily., Disp: , Rfl:   Review of Systems  Constitutional: Negative for appetite change, chills and fever.  Respiratory: Negative for chest tightness, shortness of breath and wheezing.   Cardiovascular: Negative for chest pain and palpitations.  Gastrointestinal: Negative for abdominal pain, nausea and vomiting.    Social History  Substance Use Topics  . Smoking status: Former Smoker    Packs/day: 0.25    Types: Pipe, Cigarettes  . Smokeless tobacco: Former Systems developer     Comment: smokes pipe, previously smoked 1 1/2 pack since his 71 years old  . Alcohol use Yes     Comment: Only occasionally   Objective:   BP 130/64 (BP Location: Right Arm, Patient Position: Sitting, Cuff Size: Large)   Pulse 60   Temp 98 F (36.7 C) (Oral)   Resp  16   Ht 5\' 6"  (1.676 m)   Wt 216 lb (98 kg)   SpO2 97%   BMI 34.86 kg/m  Vitals:   08/28/17 1635  BP: 130/64  Pulse: 60  Resp: 16  Temp: 98 F (36.7 C)  TempSrc: Oral  SpO2: 97%  Weight: 216 lb (98 kg)  Height: 5\' 6"  (1.676 m)     Physical Exam  General Appearance:    Alert, cooperative, no distress, obese  Eyes:    PERRL, conjunctiva/corneas clear, EOM's intact       Lungs:     Clear to auscultation bilaterally, respirations unlabored  Heart:    Regular rate and rhythm  Neurologic:   Awake, alert, oriented x 3. No apparent focal neurological           defect.            Assessment & Plan     1. Restless  leg syndrome Clonazepam no longer effective. Failed trials of gabapentin and Requip in the past per patient report.  - pramipexole (MIRAPEX) 0.125 MG tablet; One at bedtime x 4 days, then increase to 2 at bedtime for 3 days, then 3 at bedtime  Dispense: 90 tablet; Refill: 1  2. Essential hypertension Well controlled.  Continue routine follow up at [place].   3. Pre-diabetes Continue routine follow up at [place]. If next a1c above 6.5 may need to start medication   4. Need for influenza vaccination  - Flu vaccine HIGH DOSE PF  Patient Instructions   [facility] if you have Pneumovax-23.  If not then you need to get one.        Lelon Huh, MD  [facility]

## 2017-09-11 ENCOUNTER — Ambulatory Visit
Admission: RE | Admit: 2017-09-11 | Discharge: 2017-09-11 | Disposition: A | Discharge status: Home / Self Care | Payer: PPO | Source: Ambulatory Visit | Attending: Family Medicine | Admitting: Family Medicine

## 2017-09-11 ENCOUNTER — Telehealth: Payer: Self-pay

## 2017-09-11 ENCOUNTER — Ambulatory Visit (INDEPENDENT_AMBULATORY_CARE_PROVIDER_SITE_OTHER): Payer: PPO | Admitting: Family Medicine

## 2017-09-11 ENCOUNTER — Encounter: Payer: Self-pay | Admitting: Family Medicine

## 2017-09-11 VITALS — BP 140/70 | HR 61 | Temp 98.0°F | Resp 16 | Wt 219.0 lb

## 2017-09-11 DIAGNOSIS — R600 Localized edema: Secondary | ICD-10-CM | POA: Diagnosis not present

## 2017-09-11 DIAGNOSIS — M7989 Other specified soft tissue disorders: Secondary | ICD-10-CM | POA: Diagnosis not present

## 2017-09-11 DIAGNOSIS — M79662 Pain in left lower leg: Secondary | ICD-10-CM | POA: Diagnosis not present

## 2017-09-11 NOTE — Progress Notes (Signed)
Patient: Joseph Macdonald Male    DOB: 1946/01/20   71 y.o.   MRN: [id number] Visit Date: 09/11/2017  Today's Provider: Lelon Huh, MD   Chief Complaint  Patient presents with  . Leg Pain   Subjective:    Patient states that his left leg (behind left knee) starting hurting yesterday while he was walking. Patient states that when he rub that skin he felt 3 knots in the bend of his left knee. Patient stated that pain occurs only when bearing weight. However the knots have not gone away.    Leg Pain   The incident occurred 12 to 24 hours ago (yesterday). There was no injury mechanism. The pain is present in the left knee. The quality of the pain is described as burning. The pain is at a severity of 6/10. The pain is moderate. The pain has been intermittent since onset. He reports no foreign bodies present. The symptoms are aggravated by weight bearing. He has tried elevation for the symptoms. The treatment provided mild relief.       No Known Allergies   Current Outpatient Prescriptions:  .  amLODipine (NORVASC) 5 MG tablet, Take 5 mg by mouth daily. , Disp: , Rfl:  .  hydrochlorothiazide (MICROZIDE) 12.5 MG capsule, Take 25 mg by mouth daily. , Disp: , Rfl:  .  HYDROcodone-acetaminophen (NORCO/VICODIN) 5-325 MG tablet, Take 1 tablet by mouth every 6 (six) hours as needed for moderate pain., Disp: 120 tablet, Rfl: 0 .  pramipexole (MIRAPEX) 0.125 MG tablet, One at bedtime x 4 days, then increase to 2 at bedtime for 3 days, then 3 at bedtime, Disp: 90 tablet, Rfl: 1 .  predniSONE (DELTASONE) 20 MG tablet, Take 20 mg by mouth every other day. , Disp: , Rfl:  .  pyridostigmine (MESTINON) 60 MG tablet, Take 120 mg by mouth daily., Disp: , Rfl:  .  ranitidine (ZANTAC) 150 MG capsule, Take 150 mg by mouth every evening. , Disp: , Rfl:  .  terbinafine (LAMISIL) 1 % cream, Apply 1 application topically 2 (two) times daily., Disp: 42 g, Rfl: 2 .  tolnaftate (TINACTIN) 1 % cream,  Apply 1 application topically 2 (two) times daily., Disp: , Rfl:  .  vitamin B-12 (CYANOCOBALAMIN) 1000 MCG tablet, Take 1,000 mcg by mouth daily., Disp: , Rfl:   Review of Systems  Constitutional: Negative for appetite change, chills and fever.  Respiratory: Negative for chest tightness, shortness of breath and wheezing.   Cardiovascular: Negative for chest pain and palpitations.  Gastrointestinal: Negative for abdominal pain, nausea and vomiting.    Social History  Substance Use Topics  . Smoking status: Former Smoker    Packs/day: 0.25    Types: Pipe, Cigarettes  . Smokeless tobacco: Former Systems developer     Comment: smokes pipe, previously smoked 1 1/2 pack since his 71 years old  . Alcohol use Yes     Comment: Only occasionally   Objective:   BP 140/70 (BP Location: Right Arm, Patient Position: Sitting, Cuff Size: Large)   Pulse 61   Temp 98 F (36.7 C) (Oral)   Resp 16   Wt 219 lb (99.3 kg)   SpO2 93%   BMI 35.35 kg/m  Vitals:   09/11/17 1456  BP: 140/70  Pulse: 61  Resp: 16  Temp: 98 F (36.7 C)  TempSrc: Oral  SpO2: 93%  Weight: 219 lb (99.3 kg)     Physical Exam  General appearance: alert,  well developed, well nourished, cooperative and in no distress Head: Normocephalic, without obvious abnormality, atraumatic Respiratory: Respirations even and unlabored, normal respiratory rate Extremities: about 3-4cm soft cystic popliteal mass and several small firming masses, several varicosities noted, 1+ edema to mid lower leg. No erythema.      Assessment & Plan:     1. 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  - VAS [place] LOWER EXTREMITY VENOUS (DVT); Future        Lelon Huh, MD  [facility]

## 2017-09-11 NOTE — Telephone Encounter (Signed)
Requesting that doctor sign off on order for ultrasound that was placed in [email or web address]

## 2017-09-13 ENCOUNTER — Telehealth: Payer: Self-pay | Admitting: Family Medicine

## 2017-09-13 NOTE — Telephone Encounter (Signed)
Pt returning call.  HE#[phone]/[id number]

## 2017-09-13 NOTE — Telephone Encounter (Signed)
Patient was notified.

## 2017-09-18 ENCOUNTER — Ambulatory Visit: Payer: Self-pay | Admitting: Family Medicine

## 2017-10-25 ENCOUNTER — Ambulatory Visit (INDEPENDENT_AMBULATORY_CARE_PROVIDER_SITE_OTHER): Payer: PPO

## 2017-10-25 VITALS — BP 128/70 | HR 72 | Temp 98.2°F | Ht 66.0 in | Wt 223.6 lb

## 2017-10-25 DIAGNOSIS — Z0000 Encounter for general adult medical examination without abnormal findings: Secondary | ICD-10-CM

## 2017-10-25 NOTE — Progress Notes (Signed)
Subjective:   Joseph Macdonald is a 71 y.o. male who presents for Medicare Annual/Subsequent preventive examination.  Review of Systems:  N/A  Cardiac Risk Factors include: advanced age (>83men, >8 women);hypertension;male gender;obesity (BMI >30kg/m2);smoking/ tobacco exposure     Objective:    Vitals: BP 128/70 (BP Location: Left Arm)   Pulse 72   Temp 98.2 F (36.8 C) (Oral)   Ht 5\' 6"  (1.676 m)   Wt 223 lb 9.6 oz (101.4 kg)   BMI 36.09 kg/m   Body mass index is 36.09 kg/m.  Tobacco Social History   Tobacco Use  Smoking Status Former Smoker  . Packs/day: 0.50  . Types: Pipe, Cigarettes  . Last attempt to quit: 08/19/2017  . Years since quitting: 0.1  Smokeless Tobacco Former Systems developer  Tobacco Comment   previously smoked 1 1/2 pack since his 71 years old     Counseling given: Not Answered Comment: previously smoked 1 1/2 pack since his 71 years old   History reviewed. No pertinent past medical history. Past Surgical History:  Procedure Laterality Date  . SKIN GRAFT     Behind left knee  . TONSILLECTOMY AND ADENOIDECTOMY     Family History  Problem Relation Age of Onset  . Hypertension Mother   . Thyroid disease Mother   . Kidney disease Mother   . Arthritis Mother   . Stroke Father   . Heart disease Father 10  . Arthritis Father   . Arthritis Other    Social History   Substance and Sexual Activity  Sexual Activity Not on file    Outpatient Encounter Medications as of 10/25/2017  Medication Sig  . amLODipine (NORVASC) 5 MG tablet Take 5 mg by mouth daily.   . hydrochlorothiazide (MICROZIDE) 12.5 MG capsule Take 12.5 mg daily by mouth.   Marland Kitchen HYDROcodone-acetaminophen (NORCO/VICODIN) 5-325 MG tablet Take 1 tablet by mouth every 6 (six) hours as needed for moderate pain.  . pramipexole (MIRAPEX) 0.125 MG tablet One at bedtime x 4 days, then increase to 2 at bedtime for 3 days, then 3 at bedtime (Patient taking differently: 0.125 mg 3 (three) times daily.  One at bedtime x 4 days, then increase to 2 at bedtime for 3 days, then 3 at bedtime)  . predniSONE (DELTASONE) 20 MG tablet Take 20 mg by mouth every other day.   . pyridostigmine (MESTINON) 60 MG tablet Take 60 mg daily by mouth.   . ranitidine (ZANTAC) 150 MG capsule Take 150 mg by mouth every evening.   . tolnaftate (TINACTIN) 1 % cream Apply 1 application topically 2 (two) times daily.  . vitamin B-12 (CYANOCOBALAMIN) 1000 MCG tablet Take 1,000 mcg by mouth daily.  . [DISCONTINUED] terbinafine (LAMISIL) 1 % cream Apply 1 application topically 2 (two) times daily. (Patient taking differently: Apply 1 application 2 (two) times daily topically. )   No facility-administered encounter medications on file as of 10/25/2017.     Activities of Daily Living In your present state of health, do you have any difficulty performing the following activities: 10/25/2017 08/28/2017  Hearing? N N  Vision? N N  Difficulty concentrating or making decisions? N N  Walking or climbing stairs? Y N  Comment due to arthritis pain -  Dressing or bathing? N N  Doing errands, shopping? N N  Preparing Food and eating ? N -  Using the Toilet? N -  In the past six months, have you accidently leaked urine? N -  Do you have  problems with loss of bowel control? N -  Managing your Medications? N -  Managing your Finances? N -  Housekeeping or managing your Housekeeping? N -  Some recent data might be hidden    Patient Care Team: [name], MD as PCP - General (Family Medicine) Desiree Hane, [place] as Consulting Physician (Physician Assistant)   Assessment:     Exercise Activities and Dietary recommendations Current Exercise Habits: The patient does not participate in regular exercise at present, Exercise limited by: Other - see comments(due to arthritis pains)  Goals    None     Fall Risk Fall Risk  10/25/2017 08/28/2017 05/15/2016  Falls in the past year? No No No   Depression Screen PHQ 2/9 Scores  10/25/2017 08/28/2017 05/15/2016  PHQ - 2 Score 0 0 0  PHQ- 9 Score 2 0 -    Cognitive Function: Pt declined screening today.         Immunization History  Administered Date(s) Administered  . Influenza, High Dose Seasonal PF 08/21/2015, 09/12/2016, 08/28/2017  . Pneumococcal Conjugate-13 03/11/2014   Screening Tests Health Maintenance  Topic Date Due  . Hepatitis C Screening  March 13, 1946  . TETANUS/TDAP  02/20/1966  . PNA vac Low Risk Adult (2 of 2 - PPSV23) 03/12/2015  . COLONOSCOPY  05/08/2022  . INFLUENZA VACCINE  Completed      Plan:  I have personally reviewed and addressed the Medicare Annual Wellness questionnaire and have noted the following in the patient's chart:  A. Medical and social history B. Use of alcohol, tobacco or illicit drugs  C. Current medications and supplements D. Functional ability and status E.  Nutritional status F.  Physical activity G. Advance directives H. List of other physicians I.  Hospitalizations, surgeries, and ER visits in previous 12 months J.  [place] such as hearing and vision if needed, cognitive and depression L. Referrals and appointments - none  In addition, I have reviewed and discussed with patient certain preventive protocols, quality metrics, and best practice recommendations. A written personalized care plan for preventive services as well as general preventive health recommendations were provided to patient.  See attached scanned questionnaire for additional information.   Signed,  Fabio Neighbors, LPN Nurse Health Advisor   MD Recommendations: Pt needs the Hepatitis C blood work and Pneumovax 23 vaccine at next [place]. Pt declined these today due to wanting to check his records at the [place]. Pt declined the tetanus as well.

## 2017-10-25 NOTE — Patient Instructions (Signed)
Joseph Macdonald , Thank you for taking time to come for your Medicare Wellness Visit. I appreciate your ongoing commitment to your health goals. Please review the following plan we discussed and let me know if I can assist you in the future.   Screening recommendations/referrals: Colonoscopy: Up to date Recommended yearly ophthalmology/optometry visit for glaucoma screening and checkup Recommended yearly dental visit for hygiene and checkup  Vaccinations: Influenza vaccine: completed Pneumococcal vaccine: declined Pneumovax today Tdap vaccine: declined Shingles vaccine: completed   Advanced directives: Advance directive discussed with you today. Even though you declined this today please call our office should you change your mind and we can give you the proper paperwork for you to fill out.  Conditions/risks identified: Obesity- recommend cutting portion sizes in half and eating 3 small meals a day with 2 healthy snacks.    Next appointment: 12/28/16  Preventive Care 70 Years and Older, Male Preventive care refers to lifestyle choices and visits with your health care provider that can promote health and wellness. What does preventive care include?  A yearly physical exam. This is also called an annual well check.  Dental exams once or twice a year.  Routine eye exams. Ask your health care provider how often you should have your eyes checked.  Personal lifestyle choices, including:  Daily care of your teeth and gums.  Regular physical activity.  Eating a healthy diet.  Avoiding tobacco and drug use.  Limiting alcohol use.  Practicing safe sex.  Taking low doses of aspirin every day.  Taking vitamin and mineral supplements as recommended by your health care provider. What happens during an annual well check? The services and screenings done by your health care provider during your annual well check will depend on your age, overall health, lifestyle risk factors, and  family history of disease. Counseling  Your health care provider may ask you questions about your:  Alcohol use.  Tobacco use.  Drug use.  Emotional well-being.  Home and relationship well-being.  Sexual activity.  Eating habits.  History of falls.  Memory and ability to understand (cognition).  Work and work Statistician. Screening  You may have the following tests or measurements:  Height, weight, and BMI.  Blood pressure.  Lipid and cholesterol levels. These may be checked every 5 years, or more frequently if you are over 63 years old.  Skin check.  Lung cancer screening. You may have this screening every year starting at age 67 if you have a 30-pack-year history of smoking and currently smoke or have quit within the past 15 years.  Fecal occult blood test (FOBT) of the stool. You may have this test every year starting at age 46.  Flexible sigmoidoscopy or colonoscopy. You may have a sigmoidoscopy every 5 years or a colonoscopy every 10 years starting at age 27.  Prostate cancer screening. Recommendations will vary depending on your family history and other risks.  Hepatitis C blood test.  Hepatitis B blood test.  Sexually transmitted disease (STD) testing.  Diabetes screening. This is done by checking your blood sugar (glucose) after you have not eaten for a while (fasting). You may have this done every 1-3 years.  Abdominal aortic aneurysm (AAA) screening. You may need this if you are a current or former smoker.  Osteoporosis. You may be screened starting at age 63 if you are at high risk. Talk with your health care provider about your test results, treatment options, and if necessary, the need for more tests. Vaccines  Your health care provider may recommend certain vaccines, such as:  Influenza vaccine. This is recommended every year.  Tetanus, diphtheria, and acellular pertussis (Tdap, Td) vaccine. You may need a Td booster every 10 years.  Zoster  vaccine. You may need this after age 66.  Pneumococcal 13-valent conjugate (PCV13) vaccine. One dose is recommended after age 21.  Pneumococcal polysaccharide (PPSV23) vaccine. One dose is recommended after age 70. Talk to your health care provider about which screenings and vaccines you need and how often you need them. This information is not intended to replace advice given to you by your health care provider. Make sure you discuss any questions you have with your health care provider. Document Released: 12/24/2015 Document Revised: 08/16/2016 Document Reviewed: 09/28/2015 Elsevier Interactive Patient Education  2017 [employer] Prevention in the Home Falls can cause injuries. They can happen to people of all ages. There are many things you can do to make your home safe and to help prevent falls. What can I do on the outside of my home?  Regularly fix the edges of walkways and driveways and fix any cracks.  Remove anything that might make you trip as you walk through a door, such as a raised step or threshold.  Trim any bushes or trees on the path to your home.  Use bright outdoor lighting.  Clear any walking paths of anything that might make someone trip, such as rocks or tools.  Regularly check to see if handrails are loose or broken. Make sure that both sides of any steps have handrails.  Any raised decks and porches should have guardrails on the edges.  Have any leaves, snow, or ice cleared regularly.  Use sand or salt on walking paths during winter.  Clean up any spills in your garage right away. This includes oil or grease spills. What can I do in the bathroom?  Use night lights.  Install grab bars by the toilet and in the tub and shower. Do not use towel bars as grab bars.  Use non-skid mats or decals in the tub or shower.  If you need to sit down in the shower, use a plastic, non-slip stool.  Keep the floor dry. Clean up any water that spills on the  floor as soon as it happens.  Remove soap buildup in the tub or shower regularly.  Attach bath mats securely with double-sided non-slip rug tape.  Do not have throw rugs and other things on the floor that can make you trip. What can I do in the bedroom?  Use night lights.  Make sure that you have a light by your bed that is easy to reach.  Do not use any sheets or blankets that are too big for your bed. They should not hang down onto the floor.  Have a firm chair that has side arms. You can use this for support while you get dressed.  Do not have throw rugs and other things on the floor that can make you trip. What can I do in the kitchen?  Clean up any spills right away.  Avoid walking on wet floors.  Keep items that you use a lot in easy-to-reach places.  If you need to reach something above you, use a strong step stool that has a grab bar.  Keep electrical cords out of the way.  Do not use floor polish or wax that makes floors slippery. If you must use wax, use non-skid floor wax.  Do  not have throw rugs and other things on the floor that can make you trip. What can I do with my stairs?  Do not leave any items on the stairs.  Make sure that there are handrails on both sides of the stairs and use them. Fix handrails that are broken or loose. Make sure that handrails are as long as the stairways.  Check any carpeting to make sure that it is firmly attached to the stairs. Fix any carpet that is loose or worn.  Avoid having throw rugs at the top or bottom of the stairs. If you do have throw rugs, attach them to the floor with carpet tape.  Make sure that you have a light switch at the top of the stairs and the bottom of the stairs. If you do not have them, ask someone to add them for you. What else can I do to help prevent falls?  Wear shoes that:  Do not have high heels.  Have rubber bottoms.  Are comfortable and fit you well.  Are closed at the toe. Do not wear  sandals.  If you use a stepladder:  Make sure that it is fully opened. Do not climb a closed stepladder.  Make sure that both sides of the stepladder are locked into place.  Ask someone to hold it for you, if possible.  Clearly mark and make sure that you can see:  Any grab bars or handrails.  First and last steps.  Where the edge of each step is.  Use tools that help you move around (mobility aids) if they are needed. These include:  Canes.  Walkers.  Scooters.  Crutches.  Turn on the lights when you go into a dark area. Replace any light bulbs as soon as they burn out.  Set up your furniture so you have a clear path. Avoid moving your furniture around.  If any of your floors are uneven, fix them.  If there are any pets around you, be aware of where they are.  Review your medicines with your doctor. Some medicines can make you feel dizzy. This can increase your chance of falling. Ask your doctor what other things that you can do to help prevent falls. This information is not intended to replace advice given to you by your health care provider. Make sure you discuss any questions you have with your health care provider. Document Released: 09/23/2009 Document Revised: 05/04/2016 Document Reviewed: 01/01/2015 Elsevier Interactive Patient Education  2017 [employer].

## 2017-11-13 ENCOUNTER — Telehealth: Payer: Self-pay | Admitting: Family Medicine

## 2017-11-13 DIAGNOSIS — M9979 Connective tissue and disc stenosis of intervertebral foramina of abdomen and other regions: Secondary | ICD-10-CM

## 2017-11-13 MED ORDER — HYDROCODONE-ACETAMINOPHEN 5-325 MG PO TABS: 1.0000 | ORAL_TABLET | Freq: Four times a day (QID) | ORAL | 0 refills | Status: DC | PRN

## 2017-11-13 NOTE — Telephone Encounter (Signed)
L/M stating below.  

## 2017-11-13 NOTE — Telephone Encounter (Signed)
Please advise patient that hydrocodone/apap  prescription has been sent electronically to   [facility], [address] [place] [address] Phone: [phone] Fax: [phone]

## 2017-11-26 ENCOUNTER — Other Ambulatory Visit: Payer: Self-pay | Admitting: Family Medicine

## 2017-11-26 DIAGNOSIS — G2581 Restless legs syndrome: Secondary | ICD-10-CM

## 2017-11-26 MED ORDER — PRAMIPEXOLE DIHYDROCHLORIDE 0.125 MG PO TABS: 0.1250 mg | ORAL_TABLET | Freq: Three times a day (TID) | ORAL | 3 refills | Status: DC

## 2017-11-26 NOTE — Telephone Encounter (Signed)
[facility] faxed refill request for the following medications:  pramipexole (MIRAPEX) 0.125 MG tablet  90 day supply  Last Rx: 08/28/17 LOV: 09/11/17 Please advise. Thanks TNP

## 2017-11-26 NOTE — Telephone Encounter (Signed)
Requesting 90 day supply.

## 2017-11-27 ENCOUNTER — Telehealth: Payer: Self-pay | Admitting: Family Medicine

## 2017-11-27 DIAGNOSIS — G2581 Restless legs syndrome: Secondary | ICD-10-CM

## 2017-11-27 MED ORDER — PRAMIPEXOLE DIHYDROCHLORIDE 0.5 MG PO TABS: 0.5000 mg | ORAL_TABLET | Freq: Every day | ORAL | 5 refills | Status: DC

## 2017-11-27 NOTE — Telephone Encounter (Signed)
Joseph Macdonald with [facility] is requesting a call back to verify the directions.  OR#[phone]/KF

## 2017-11-27 NOTE — Telephone Encounter (Signed)
Joseph Macdonald with [facility] is requesting call back to confirm direction for pramipexole (MIRAPEX) 0.125 MG tablet. Please advise. Thanks TNP

## 2017-11-27 NOTE — Telephone Encounter (Signed)
Patient stated that pramipexole is working well and helping him with leg cramps. Patient is willing to try the one pill dose.

## 2017-11-27 NOTE — Telephone Encounter (Signed)
Left message to call back  

## 2017-11-27 NOTE — Telephone Encounter (Signed)
He is to take this 3 times a day right? The directions also say to take at night. Please review. Thanks!

## 2017-11-27 NOTE — Telephone Encounter (Signed)
Please check with patient and see how he has been taking it and how it has been working for his restless legs. We can adjust the dose of the pill so he only has to take one at night.

## 2017-11-27 NOTE — Telephone Encounter (Signed)
Pt is returning call.  CB#[phone]

## 2017-12-28 ENCOUNTER — Ambulatory Visit (INDEPENDENT_AMBULATORY_CARE_PROVIDER_SITE_OTHER): Payer: PPO | Admitting: Family Medicine

## 2017-12-28 ENCOUNTER — Encounter: Payer: Self-pay | Admitting: Family Medicine

## 2017-12-28 VITALS — BP 124/72 | HR 64 | Temp 98.3°F | Resp 16 | Wt 228.0 lb

## 2017-12-28 DIAGNOSIS — I10 Essential (primary) hypertension: Secondary | ICD-10-CM

## 2017-12-28 DIAGNOSIS — M9979 Connective tissue and disc stenosis of intervertebral foramina of abdomen and other regions: Secondary | ICD-10-CM

## 2017-12-28 DIAGNOSIS — F17210 Nicotine dependence, cigarettes, uncomplicated: Secondary | ICD-10-CM | POA: Diagnosis not present

## 2017-12-28 DIAGNOSIS — R7303 Prediabetes: Secondary | ICD-10-CM | POA: Diagnosis not present

## 2017-12-28 MED ORDER — HYDROCODONE-ACETAMINOPHEN 5-325 MG PO TABS: 1.0000 | ORAL_TABLET | Freq: Four times a day (QID) | ORAL | 0 refills | Status: DC | PRN

## 2017-12-28 NOTE — Progress Notes (Signed)
Patient: Joseph Macdonald Male    DOB: 12/22/1945   72 y.o.   MRN: [id number] Visit Date: 12/28/2017  Today's Provider: Lelon Huh, MD   Chief Complaint  Patient presents with  . Hypertension  . Hyperglycemia  . restless legs syndrome   Subjective:    HPI  Hypertension, follow-up:  BP Readings from Last 3 Encounters:  12/28/17 124/72  10/25/17 128/70  09/11/17 140/70    He was last seen for hypertension 4 months ago.  BP at that visit was 130/64. Management since that visit includes no changes. He reports good compliance with treatment. He is not having side effects.  He is exercising. He is adherent to low salt diet.   Outside blood pressures are checked occasionally. He is experiencing none.  Patient denies exertional chest pressure/discomfort, lower extremity edema and palpitations Cardiovascular risk factors include diabetes mellitus.      Weight trend: stable Wt Readings from Last 3 Encounters:  12/28/17 228 lb (103.4 kg)  10/25/17 223 lb 9.6 oz (101.4 kg)  09/11/17 219 lb (99.3 kg)    Current diet: well balanced   Hyperglycemia, Follow-up:   Lab Results  Component Value Date   HGBA1C 6.3 12/21/2015   He reports that he had his HgbA1c done at the [place] [facility] and it was 6.2%. Last seen for for this 4 months ago.  Management since then includes no changes. Current symptoms include none and have been stable.  Weight trend: stable Prior visit with dietician: no Current diet: well balanced Current exercise: no regular exercise  Pertinent Labs:    Component Value Date/Time   CHOL 171 12/21/2015   CREATININE 0.9 12/21/2015    Wt Readings from Last 3 Encounters:  12/28/17 228 lb (103.4 kg)  10/25/17 223 lb 9.6 oz (101.4 kg)  09/11/17 219 lb (99.3 kg)   Restless Legs Syndrome: Patient was last seen in the office 4 months ago. She was started on Mirapex. He states that initially he felt the medication helped, but now it doesn't seem to be  helping much.   He had lipids checked at [facility] in December with LDL of 69 and HDL of 75. States he has been started on pravastatin 20mg  which he is tolerating well.     No Known Allergies   Current Outpatient Medications:  .  amLODipine (NORVASC) 5 MG tablet, Take 5 mg by mouth daily. , Disp: , Rfl:  .  hydrochlorothiazide (MICROZIDE) 12.5 MG capsule, Take 12.5 mg daily by mouth. , Disp: , Rfl:  .  HYDROcodone-acetaminophen (NORCO/VICODIN) 5-325 MG tablet, Take 1 tablet by mouth every 6 (six) hours as needed for moderate pain., Disp: 120 tablet, Rfl: 0 .  pramipexole (MIRAPEX) 0.5 MG tablet, Take 1 tablet (0.5 mg total) by mouth at bedtime., Disp: 30 tablet, Rfl: 5 .  predniSONE (DELTASONE) 20 MG tablet, Take 20 mg by mouth every other day. , Disp: , Rfl:  .  pyridostigmine (MESTINON) 60 MG tablet, Take 60 mg daily by mouth. , Disp: , Rfl:  .  ranitidine (ZANTAC) 150 MG capsule, Take 150 mg by mouth every evening. , Disp: , Rfl:  .  tolnaftate (TINACTIN) 1 % cream, Apply 1 application topically 2 (two) times daily., Disp: , Rfl:  .  vitamin B-12 (CYANOCOBALAMIN) 1000 MCG tablet, Take 1,000 mcg by mouth daily., Disp: , Rfl:   Review of Systems  Constitutional: Negative for activity change, appetite change, chills, diaphoresis and fatigue.  Cardiovascular: Negative for  chest pain, palpitations and leg swelling.  Musculoskeletal: Positive for myalgias.  Neurological: Negative for dizziness and headaches.    Social History   Tobacco Use  . Smoking status: Former Smoker    Packs/day: 0.50    Types: Pipe, Cigarettes    Last attempt to quit: 08/19/2017    Years since quitting: 0.3  . Smokeless tobacco: Former Systems developer  . Tobacco comment: previously smoked 1 1/2 pack since his 72 years old  Substance Use Topics  . Alcohol use: Yes    Comment: occasionally   Objective:   BP 124/72   Pulse 64   Temp 98.3 F (36.8 C)   Resp 16   Wt 228 lb (103.4 kg)   SpO2 95%   BMI 36.80 kg/m  Vitals:     12/28/17 1138  BP: 124/72  Pulse: 64  Resp: 16  Temp: 98.3 F (36.8 C)  SpO2: 95%  Weight: 228 lb (103.4 kg)     Physical Exam   General Appearance:    Alert, cooperative, no distress  Eyes:    PERRL, conjunctiva/corneas clear, EOM's intact       Lungs:     Clear to auscultation bilaterally, respirations unlabored  Heart:    Regular rate and rhythm  Neurologic:   Awake, alert, oriented x 3. No apparent focal neurological           defect.           Assessment & Plan:     1. Essential hypertension Well controlled.  Continue current medications.    2. [facility]-di[facility] at [facility]=6.[facility]%. He will follow up [facility] in June for A1c. Apparently started on pravastatin 20mg  at [facility] by patient report  3. Narrowing of intervertebral disc space  - HYDROcodone-acetaminophen (NORCO/VICODIN) 5-325 MG tablet; Take 1 tablet by mouth every 6 (six) hours as needed for moderate pain.  Dispense: 120 tablet; Refill: 0  4. Smoking greater than 30 pack years By patient report is getting yearly LDCT at [place].   Follow up 6 months.       Lelon Huh, MD  [facility]

## 2018-02-01 ENCOUNTER — Other Ambulatory Visit: Payer: Self-pay | Admitting: *Deleted

## 2018-02-01 DIAGNOSIS — M9979 Connective tissue and disc stenosis of intervertebral foramina of abdomen and other regions: Secondary | ICD-10-CM

## 2018-02-01 MED ORDER — HYDROCODONE-ACETAMINOPHEN 5-325 MG PO TABS: 1.0000 | ORAL_TABLET | Freq: Four times a day (QID) | ORAL | 0 refills | Status: DC | PRN

## 2018-03-07 ENCOUNTER — Other Ambulatory Visit: Payer: Self-pay | Admitting: Family Medicine

## 2018-03-07 DIAGNOSIS — M9979 Connective tissue and disc stenosis of intervertebral foramina of abdomen and other regions: Secondary | ICD-10-CM

## 2018-03-07 MED ORDER — HYDROCODONE-ACETAMINOPHEN 5-325 MG PO TABS: 1.0000 | ORAL_TABLET | Freq: Four times a day (QID) | ORAL | 0 refills | Status: DC | PRN

## 2018-04-08 ENCOUNTER — Other Ambulatory Visit: Payer: Self-pay | Admitting: *Deleted

## 2018-04-08 DIAGNOSIS — M9979 Connective tissue and disc stenosis of intervertebral foramina of abdomen and other regions: Secondary | ICD-10-CM

## 2018-04-08 MED ORDER — HYDROCODONE-ACETAMINOPHEN 5-325 MG PO TABS: 1.0000 | ORAL_TABLET | Freq: Four times a day (QID) | ORAL | 0 refills | Status: DC | PRN

## 2018-05-13 ENCOUNTER — Other Ambulatory Visit: Payer: Self-pay | Admitting: *Deleted

## 2018-05-13 DIAGNOSIS — M9979 Connective tissue and disc stenosis of intervertebral foramina of abdomen and other regions: Secondary | ICD-10-CM

## 2018-05-13 MED ORDER — HYDROCODONE-ACETAMINOPHEN 5-325 MG PO TABS: 1.0000 | ORAL_TABLET | Freq: Four times a day (QID) | ORAL | 0 refills | Status: DC | PRN

## 2018-06-11 ENCOUNTER — Other Ambulatory Visit: Payer: Self-pay | Admitting: *Deleted

## 2018-06-11 DIAGNOSIS — G2581 Restless legs syndrome: Secondary | ICD-10-CM

## 2018-06-11 MED ORDER — PRAMIPEXOLE DIHYDROCHLORIDE 0.5 MG PO TABS: 0.5000 mg | ORAL_TABLET | Freq: Every day | ORAL | 5 refills | Status: DC

## 2018-06-17 ENCOUNTER — Other Ambulatory Visit: Payer: Self-pay

## 2018-06-17 DIAGNOSIS — M9979 Connective tissue and disc stenosis of intervertebral foramina of abdomen and other regions: Secondary | ICD-10-CM

## 2018-06-17 MED ORDER — HYDROCODONE-ACETAMINOPHEN 5-325 MG PO TABS: 1.0000 | ORAL_TABLET | Freq: Four times a day (QID) | ORAL | 0 refills | Status: DC | PRN

## 2018-07-29 ENCOUNTER — Other Ambulatory Visit: Payer: Self-pay | Admitting: Family Medicine

## 2018-07-29 DIAGNOSIS — M9979 Connective tissue and disc stenosis of intervertebral foramina of abdomen and other regions: Secondary | ICD-10-CM

## 2018-07-29 MED ORDER — HYDROCODONE-ACETAMINOPHEN 5-325 MG PO TABS: 1.0000 | ORAL_TABLET | Freq: Four times a day (QID) | ORAL | 0 refills | Status: DC | PRN

## 2018-08-27 ENCOUNTER — Other Ambulatory Visit: Payer: Self-pay | Admitting: Family Medicine

## 2018-08-27 DIAGNOSIS — M9979 Connective tissue and disc stenosis of intervertebral foramina of abdomen and other regions: Secondary | ICD-10-CM

## 2018-08-27 MED ORDER — HYDROCODONE-ACETAMINOPHEN 5-325 MG PO TABS: 1.0000 | ORAL_TABLET | Freq: Four times a day (QID) | ORAL | 0 refills | Status: DC | PRN

## 2018-09-11 ENCOUNTER — Ambulatory Visit (INDEPENDENT_AMBULATORY_CARE_PROVIDER_SITE_OTHER): Payer: PPO

## 2018-09-11 DIAGNOSIS — Z23 Encounter for immunization: Secondary | ICD-10-CM | POA: Diagnosis not present

## 2018-09-26 ENCOUNTER — Telehealth: Payer: Self-pay

## 2018-09-26 NOTE — Telephone Encounter (Signed)
LM for pt to CB to schedule his AWV. Last years wellness was 10/25/17. -MM

## 2018-09-30 ENCOUNTER — Other Ambulatory Visit: Payer: Self-pay | Admitting: Family Medicine

## 2018-09-30 DIAGNOSIS — M9979 Connective tissue and disc stenosis of intervertebral foramina of abdomen and other regions: Secondary | ICD-10-CM

## 2018-09-30 MED ORDER — HYDROCODONE-ACETAMINOPHEN 5-325 MG PO TABS: 1.0000 | ORAL_TABLET | Freq: Four times a day (QID) | ORAL | 0 refills | Status: DC | PRN

## 2018-10-07 NOTE — Telephone Encounter (Signed)
Spoke with pt and scheduled AWV for 10/28/18 @ 2 PM. -MM

## 2018-10-28 ENCOUNTER — Ambulatory Visit (INDEPENDENT_AMBULATORY_CARE_PROVIDER_SITE_OTHER): Payer: PPO

## 2018-10-28 VITALS — BP 136/68 | HR 76 | Temp 99.5°F | Ht 66.0 in | Wt 220.6 lb

## 2018-10-28 DIAGNOSIS — Z0000 Encounter for general adult medical examination without abnormal findings: Secondary | ICD-10-CM

## 2018-10-28 NOTE — Progress Notes (Signed)
Subjective:   Joseph Macdonald is a 72 y.o. male who presents for Medicare Annual/Subsequent preventive examination.  Review of Systems:  N/A  Cardiac Risk Factors include: advanced age (>55men, >62 women);dyslipidemia;hypertension;male gender;obesity (BMI >30kg/m2);smoking/ tobacco exposure     Objective:    Vitals: BP 136/68 (BP Location: Right Arm)   Pulse 76   Temp 99.5 F (37.5 C) (Oral)   Ht 5\' 6"  (1.676 m)   Wt 220 lb 9.6 oz (100.1 kg)   BMI 35.61 kg/m   Body mass index is 35.61 kg/m.  Advanced Directives 10/28/2018 10/25/2017 05/15/2016 01/17/2016  Does Patient Have a Medical Advance Directive? No No No No  Would patient like information on creating a medical advance directive? - No - Patient declined - -    Tobacco Social History   Tobacco Use  Smoking Status Former Smoker  . Packs/day: 0.50  . Types: Pipe, Cigarettes  . Last attempt to quit: 08/19/2017  . Years since quitting: 1.1  Smokeless Tobacco Former Systems developer  Tobacco Comment   previously smoked 1 1/2 pack since his 72 years old     Counseling given: Not Answered Comment: previously smoked 1 1/2 pack since his 72 years old   Clinical Intake:  Pre-visit preparation completed: Yes  Pain : 0-10 Pain Score: 6  Pain Type: Chronic pain Pain Location: Knee Pain Orientation: Left Pain Descriptors / Indicators: Aching Pain Frequency: Constant     Nutritional Status: BMI > 30  Obese Diabetes: No  How often do you need to have someone help you when you read instructions, pamphlets, or other written materials from your doctor or pharmacy?: 1 - Never  Interpreter Needed?: No  Information entered by :: [facility], LPN  Past Medical History:  Diagnosis Date  . Hypertension    Past Surgical History:  Procedure Laterality Date  . carpel tunnel sx    . CATARACT EXTRACTION     right eye  . SKIN GRAFT     Behind left knee  . TONSILLECTOMY AND ADENOIDECTOMY     Family History  Problem Relation Age  of Onset  . Hypertension Mother   . Thyroid disease Mother   . Kidney disease Mother   . Arthritis Mother   . Stroke Father   . Heart disease Father 81  . Arthritis Father   . Arthritis Other    Social History   Socioeconomic History  . Marital status: Married    Spouse name: Not on file  . Number of children: 1  . Years of education: H/S  . Highest education level: High school graduate  Occupational History  . Occupation: Retired Military and [employer]  . Financial resource strain: Not hard at all  . Food insecurity:    Worry: Never true    Inability: Never true  . Transportation needs:    Medical: No    Non-medical: No  Tobacco Use  . Smoking status: Former Smoker    Packs/day: 0.50    Types: Pipe, Cigarettes    Last attempt to quit: 08/19/2017    Years since quitting: 1.1  . Smokeless tobacco: Former Systems developer  . Tobacco comment: previously smoked 1 1/2 pack since his 73 years old  Substance and Sexual Activity  . Alcohol use: Yes    Comment: occasionally  . Drug use: No  . Sexual activity: Not on file  Lifestyle  . Physical activity:    Days per week: 0 days    Minutes per  session: 0 min  . Stress: Not at all  Relationships  . Social connections:    Talks on phone: Patient refused    Gets together: Patient refused    Attends religious service: Patient refused    Active member of club or organization: Patient refused    Attends meetings of clubs or organizations: Patient refused    Relationship status: Patient refused  Other Topics Concern  . Not on file  Social History Narrative   2 adopted children    Outpatient Encounter Medications as of 10/28/2018  Medication Sig  . amLODipine (NORVASC) 5 MG tablet Take 5 mg by mouth daily.   . hydrochlorothiazide (MICROZIDE) 12.5 MG capsule Take 12.5 mg daily by mouth.   Marland Kitchen HYDROcodone-acetaminophen (NORCO/VICODIN) 5-325 MG tablet Take 1 tablet by mouth every 6 (six) hours as needed for moderate pain.    . pramipexole (MIRAPEX) 0.5 MG tablet Take 1 tablet (0.5 mg total) by mouth at bedtime.  . pravastatin (PRAVACHOL) 20 MG tablet Take 20 mg by mouth daily.  . predniSONE (DELTASONE) 20 MG tablet Take 20 mg by mouth every other day.   . ranitidine (ZANTAC) 150 MG capsule Take 150 mg by mouth every evening.   . tolnaftate (TINACTIN) 1 % cream Apply 1 application topically 2 (two) times daily.   . vitamin B-12 (CYANOCOBALAMIN) 1000 MCG tablet Take 1,000 mcg by mouth daily.  Marland Kitchen pyridostigmine (MESTINON) 60 MG tablet Take 60 mg daily by mouth.    No facility-administered encounter medications on file as of 10/28/2018.     Activities of Daily Living In your present state of health, do you have any difficulty performing the following activities: 10/28/2018  Hearing? Y  Comment Some trouble hearing, does not wear hearin aids.   Vision? N  Comment Wears readers.  Difficulty concentrating or making decisions? N  Walking or climbing stairs? Y  Comment Due to hip and knee pain.   Dressing or bathing? N  Doing errands, shopping? N  Preparing Food and eating ? N  Using the Toilet? N  In the past six months, have you accidently leaked urine? N  Do you have problems with loss of bowel control? N  Managing your Medications? N  Managing your Finances? N  Housekeeping or managing your Housekeeping? N  Some recent data might be hidden    Patient Care Team: [name], MD as PCP - General (Family Medicine) Desiree Hane, [place] as Consulting Physician (Physician Assistant)   Assessment:   This is a routine wellness examination for Joseph Macdonald.  Exercise Activities and Dietary recommendations Current Exercise Habits: The patient does not participate in regular exercise at present, Exercise limited by: orthopedic condition(s)  Goals    . Reduce portion size     Recommend cutting portion sizes in half and eating 3 small meals a day with 2 healthy snacks.        Fall Risk Fall Risk  10/28/2018  10/25/2017 08/28/2017 05/15/2016  Falls in the past year? 0 No No No   FALL RISK PREVENTION PERTAINING TO THE HOME:  Any stairs in or around the home WITH handrails? No  Home free of loose throw rugs in walkways, pet beds, electrical cords, etc? Yes  Adequate lighting in your home to reduce risk of falls? Yes   ASSISTIVE DEVICES UTILIZED TO PREVENT FALLS:  Life alert? No  Use of a cane, walker or w/c? Yes  , has a walker and cance to use as needed. Grab bars in  the bathroom? No  Shower chair or bench in shower? Yes  Elevated toilet seat or a handicapped toilet? No    TIMED UP AND GO:  Was the test performed? No .     Depression Screen PHQ 2/9 Scores 10/28/2018 10/25/2017 08/28/2017 05/15/2016  PHQ - 2 Score 0 0 0 0  PHQ- 9 Score - 2 0 -    Cognitive Function: Pt declined today.         Immunization History  Administered Date(s) Administered  . Influenza, High Dose Seasonal PF 08/21/2015, 09/12/2016, 08/28/2017, 09/11/2018  . Pneumococcal Conjugate-13 03/11/2014    Qualifies for Shingles Vaccine? Yes . Due for Shingrix. Education has been provided regarding the importance of this vaccine. Pt has been advised to call insurance company to determine out of pocket expense. Advised may also receive vaccine at local pharmacy or Health Dept. Verbalized acceptance and understanding.  Tdap:  Per pt - updated at [place]. Requested records.  Flu Vaccine: Up to date  Pneumococcal Vaccine: Per pt  Screening Tests Health Maintenance  Topic Date Due  . Hepatitis C Screening  1946/02/06  . TETANUS/TDAP  02/20/1966  . PNA vac Low Risk Adult (2 of 2 - PPSV23) 03/12/2015  . COLONOSCOPY  05/08/2017  . INFLUENZA VACCINE  Completed   Cancer Screenings:  Colorectal Screening:  Per pt - updated at [place]. Requested records.  Lung Cancer Screening: (Low Dose CT Chest recommended if Age 30-80 years, 30 pack-year currently smoking OR have quit w/in 15years.) does qualify.    Additional  Screening:  Hepatitis C Screening: does qualify; per pt - updated at [place]. Requested records.  Vision Screening: Recommended annual ophthalmology exams for early detection of glaucoma and other disorders of the eye.  Dental Screening: Recommended annual dental exams for proper oral hygiene  Community Resource Referral:  CRR required this visit?  No        Plan:  I have personally reviewed and addressed the Medicare Annual Wellness questionnaire and have noted the following in the patient's chart:  A. Medical and social history B. Use of alcohol, tobacco or illicit drugs  C. Current medications and supplements D. Functional ability and status E.  Nutritional status F.  Physical activity G. Advance directives H. List of other physicians I.  Hospitalizations, surgeries, and ER visits in previous 12 months J.  [place] such as hearing and vision if needed, cognitive and depression L. Referrals and appointments - none  In addition, I have reviewed and discussed with patient certain preventive protocols, quality metrics, and best practice recommendations. A written personalized care plan for preventive services as well as general preventive health recommendations were provided to patient.  See attached scanned questionnaire for additional information.   Signed,  Fabio Neighbors, LPN Nurse Health Advisor   Nurse Recommendations: Pt declined a colonoscopy referral, Hep C lab order, pneumonia and tetanus vaccines today. Pt states he had all this completed at the [place]. Records requested, pt to retreive from [place].

## 2018-10-28 NOTE — Patient Instructions (Addendum)
[name] , Thank you for taking time to come for your Medicare Wellness Visit. I appreciate your ongoing commitment to your health goals. Please review the following plan we discussed and let me know if I can assist you in the future.   Screening recommendations/referrals: Colonoscopy: Per pt - updated at [place]. Requested records.  Recommended yearly ophthalmology/optometry visit for glaucoma screening and checkup Recommended yearly dental visit for hygiene and checkup  Vaccinations: Influenza vaccine: Up to date Pneumococcal vaccine: Per pt - updated at [place]. Requested records. Tdap vaccine:  Per pt - updated at [place]. Requested records. Shingles vaccine: Pt declines today.     Advanced directives: Please bring a copy of your POA (Power of Attorney) and/or Living Will to your next appointment once completed.  Conditions/risks identified: Obesity- Continue current diet plan of eating smaller portions.   Next appointment: 01/13/19 @ 10 AM with Caryn Section.  Preventive Care 32 Years and Older, Male Preventive care refers to lifestyle choices and visits with your health care provider that can promote health and wellness. What does preventive care include?  A yearly physical exam. This is also called an annual well check.  Dental exams once or twice a year.  Routine eye exams. Ask your health care provider how often you should have your eyes checked.  Personal lifestyle choices, including:  Daily care of your teeth and gums.  Regular physical activity.  Eating a healthy diet.  Avoiding tobacco and drug use.  Limiting alcohol use.  Practicing safe sex.  Taking low doses of aspirin every day.  Taking vitamin and mineral supplements as recommended by your health care provider. What happens during an annual well check? The services and screenings done by your health care provider during your annual well check will depend on your age, overall health, lifestyle risk factors, and family  history of disease. Counseling  Your health care provider may ask you questions about your:  Alcohol use.  Tobacco use.  Drug use.  Emotional well-being.  Home and relationship well-being.  Sexual activity.  Eating habits.  History of falls.  Memory and ability to understand (cognition).  Work and work Statistician. Screening  You may have the following tests or measurements:  Height, weight, and BMI.  Blood pressure.  Lipid and cholesterol levels. These may be checked every 5 years, or more frequently if you are over 71 years old.  Skin check.  Lung cancer screening. You may have this screening every year starting at age 71 if you have a 30-pack-year history of smoking and currently smoke or have quit within the past 15 years.  Fecal occult blood test (FOBT) of the stool. You may have this test every year starting at age 71.  Flexible sigmoidoscopy or colonoscopy. You may have a sigmoidoscopy every 5 years or a colonoscopy every 10 years starting at age 71.  Prostate cancer screening. Recommendations will vary depending on your family history and other risks.  Hepatitis C blood test.  Hepatitis B blood test.  Sexually transmitted disease (STD) testing.  Diabetes screening. This is done by checking your blood sugar (glucose) after you have not eaten for a while (fasting). You may have this done every 1-3 years.  Abdominal aortic aneurysm (AAA) screening. You may need this if you are a current or former smoker.  Osteoporosis. You may be screened starting at age 71 if you are at high risk. Talk with your health care provider about your test results, treatment options, and if necessary, the  need for more tests. Vaccines  Your health care provider may recommend certain vaccines, such as:  Influenza vaccine. This is recommended every year.  Tetanus, diphtheria, and acellular pertussis (Tdap, Td) vaccine. You may need a Td booster every 10 years.  Zoster vaccine.  You may need this after age 71.  Pneumococcal 13-valent conjugate (PCV13) vaccine. One dose is recommended after age 71.  Pneumococcal polysaccharide (PPSV23) vaccine. One dose is recommended after age 71. Talk to your health care provider about which screenings and vaccines you need and how often you need them. This information is not intended to replace advice given to you by your health care provider. Make sure you discuss any questions you have with your health care provider. Document Released: 12/24/2015 Document Revised: 08/16/2016 Document Reviewed: 09/28/2015 Elsevier Interactive Patient Education  2017 [place] Prevention in the Home Falls can cause injuries. They can happen to people of all ages. There are many things you can do to make your home safe and to help prevent falls. What can I do on the outside of my home?  Regularly fix the edges of walkways and driveways and fix any cracks.  Remove anything that might make you trip as you walk through a door, such as a raised step or threshold.  Trim any bushes or trees on the path to your home.  Use bright outdoor lighting.  Clear any walking paths of anything that might make someone trip, such as rocks or tools.  Regularly check to see if handrails are loose or broken. Make sure that both sides of any steps have handrails.  Any raised decks and porches should have guardrails on the edges.  Have any leaves, snow, or ice cleared regularly.  Use sand or salt on walking paths during winter.  Clean up any spills in your garage right away. This includes oil or grease spills. What can I do in the bathroom?  Use night lights.  Install grab bars by the toilet and in the tub and shower. Do not use towel bars as grab bars.  Use non-skid mats or decals in the tub or shower.  If you need to sit down in the shower, use a plastic, non-slip stool.  Keep the floor dry. Clean up any water that spills on the floor as soon  as it happens.  Remove soap buildup in the tub or shower regularly.  Attach bath mats securely with double-sided non-slip rug tape.  Do not have throw rugs and other things on the floor that can make you trip. What can I do in the bedroom?  Use night lights.  Make sure that you have a light by your bed that is easy to reach.  Do not use any sheets or blankets that are too big for your bed. They should not hang down onto the floor.  Have a firm chair that has side arms. You can use this for support while you get dressed.  Do not have throw rugs and other things on the floor that can make you trip. What can I do in the kitchen?  Clean up any spills right away.  Avoid walking on wet floors.  Keep items that you use a lot in easy-to-reach places.  If you need to reach something above you, use a strong step stool that has a grab bar.  Keep electrical cords out of the way.  Do not use floor polish or wax that makes floors slippery. If you must use wax,  use non-skid floor wax.  Do not have throw rugs and other things on the floor that can make you trip. What can I do with my stairs?  Do not leave any items on the stairs.  Make sure that there are handrails on both sides of the stairs and use them. Fix handrails that are broken or loose. Make sure that handrails are as long as the stairways.  Check any carpeting to make sure that it is firmly attached to the stairs. Fix any carpet that is loose or worn.  Avoid having throw rugs at the top or bottom of the stairs. If you do have throw rugs, attach them to the floor with carpet tape.  Make sure that you have a light switch at the top of the stairs and the bottom of the stairs. If you do not have them, ask someone to add them for you. What else can I do to help prevent falls?  Wear shoes that:  Do not have high heels.  Have rubber bottoms.  Are comfortable and fit you well.  Are closed at the toe. Do not wear sandals.  If  you use a stepladder:  Make sure that it is fully opened. Do not climb a closed stepladder.  Make sure that both sides of the stepladder are locked into place.  Ask someone to hold it for you, if possible.  Clearly mark and make sure that you can see:  Any grab bars or handrails.  First and last steps.  Where the edge of each step is.  Use tools that help you move around (mobility aids) if they are needed. These include:  Canes.  Walkers.  Scooters.  Crutches.  Turn on the lights when you go into a dark area. Replace any light bulbs as soon as they burn out.  Set up your furniture so you have a clear path. Avoid moving your furniture around.  If any of your floors are uneven, fix them.  If there are any pets around you, be aware of where they are.  Review your medicines with your doctor. Some medicines can make you feel dizzy. This can increase your chance of falling. Ask your doctor what other things that you can do to help prevent falls. This information is not intended to replace advice given to you by your health care provider. Make sure you discuss any questions you have with your health care provider. Document Released: 09/23/2009 Document Revised: 05/04/2016 Document Reviewed: 01/01/2015 Elsevier Interactive Patient Education  2017 [employer].

## 2018-10-31 ENCOUNTER — Other Ambulatory Visit: Payer: Self-pay | Admitting: Family Medicine

## 2018-10-31 DIAGNOSIS — M9979 Connective tissue and disc stenosis of intervertebral foramina of abdomen and other regions: Secondary | ICD-10-CM

## 2018-10-31 MED ORDER — HYDROCODONE-ACETAMINOPHEN 5-325 MG PO TABS: 1.0000 | ORAL_TABLET | Freq: Four times a day (QID) | ORAL | 0 refills | Status: DC | PRN

## 2018-12-02 ENCOUNTER — Other Ambulatory Visit: Payer: Self-pay | Admitting: Family Medicine

## 2018-12-02 DIAGNOSIS — M9979 Connective tissue and disc stenosis of intervertebral foramina of abdomen and other regions: Secondary | ICD-10-CM

## 2018-12-02 MED ORDER — HYDROCODONE-ACETAMINOPHEN 5-325 MG PO TABS: 1.0000 | ORAL_TABLET | Freq: Four times a day (QID) | ORAL | 0 refills | Status: DC | PRN

## 2018-12-23 ENCOUNTER — Encounter: Payer: Self-pay | Admitting: Internal Medicine

## 2018-12-24 ENCOUNTER — Other Ambulatory Visit: Payer: Self-pay | Admitting: Family Medicine

## 2018-12-24 ENCOUNTER — Other Ambulatory Visit: Payer: Self-pay

## 2018-12-24 DIAGNOSIS — G2581 Restless legs syndrome: Secondary | ICD-10-CM

## 2018-12-24 MED ORDER — PRAMIPEXOLE DIHYDROCHLORIDE 0.5 MG PO TABS: 0.5000 mg | ORAL_TABLET | Freq: Every day | ORAL | 11 refills | Status: DC

## 2018-12-31 DIAGNOSIS — G7000 Myasthenia gravis without (acute) exacerbation: Secondary | ICD-10-CM | POA: Diagnosis not present

## 2019-01-02 DIAGNOSIS — Z452 Encounter for adjustment and management of vascular access device: Secondary | ICD-10-CM | POA: Diagnosis not present

## 2019-01-02 DIAGNOSIS — R918 Other nonspecific abnormal finding of lung field: Secondary | ICD-10-CM | POA: Diagnosis not present

## 2019-01-02 DIAGNOSIS — G7000 Myasthenia gravis without (acute) exacerbation: Secondary | ICD-10-CM | POA: Diagnosis not present

## 2019-01-02 DIAGNOSIS — R1312 Dysphagia, oropharyngeal phase: Secondary | ICD-10-CM | POA: Diagnosis not present

## 2019-01-03 DIAGNOSIS — Z7689 Persons encountering health services in other specified circumstances: Secondary | ICD-10-CM | POA: Diagnosis not present

## 2019-01-03 DIAGNOSIS — R1312 Dysphagia, oropharyngeal phase: Secondary | ICD-10-CM | POA: Diagnosis not present

## 2019-01-03 DIAGNOSIS — G7000 Myasthenia gravis without (acute) exacerbation: Secondary | ICD-10-CM | POA: Diagnosis not present

## 2019-01-04 DIAGNOSIS — R0789 Other chest pain: Secondary | ICD-10-CM | POA: Diagnosis not present

## 2019-01-04 DIAGNOSIS — I499 Cardiac arrhythmia, unspecified: Secondary | ICD-10-CM | POA: Diagnosis not present

## 2019-01-04 DIAGNOSIS — R1312 Dysphagia, oropharyngeal phase: Secondary | ICD-10-CM | POA: Diagnosis not present

## 2019-01-04 DIAGNOSIS — G7000 Myasthenia gravis without (acute) exacerbation: Secondary | ICD-10-CM | POA: Diagnosis not present

## 2019-01-05 DIAGNOSIS — R1312 Dysphagia, oropharyngeal phase: Secondary | ICD-10-CM | POA: Diagnosis not present

## 2019-01-05 DIAGNOSIS — G7000 Myasthenia gravis without (acute) exacerbation: Secondary | ICD-10-CM | POA: Diagnosis not present

## 2019-01-05 DIAGNOSIS — R0789 Other chest pain: Secondary | ICD-10-CM | POA: Diagnosis not present

## 2019-01-05 DIAGNOSIS — I499 Cardiac arrhythmia, unspecified: Secondary | ICD-10-CM | POA: Diagnosis not present

## 2019-01-06 DIAGNOSIS — G7000 Myasthenia gravis without (acute) exacerbation: Secondary | ICD-10-CM | POA: Diagnosis not present

## 2019-01-07 DIAGNOSIS — G7000 Myasthenia gravis without (acute) exacerbation: Secondary | ICD-10-CM | POA: Diagnosis not present

## 2019-01-08 DIAGNOSIS — G7000 Myasthenia gravis without (acute) exacerbation: Secondary | ICD-10-CM | POA: Diagnosis not present

## 2019-01-08 DIAGNOSIS — Z7689 Persons encountering health services in other specified circumstances: Secondary | ICD-10-CM | POA: Diagnosis not present

## 2019-01-09 DIAGNOSIS — G7000 Myasthenia gravis without (acute) exacerbation: Secondary | ICD-10-CM | POA: Diagnosis not present

## 2019-01-10 ENCOUNTER — Telehealth: Payer: Self-pay

## 2019-01-10 NOTE — Telephone Encounter (Signed)
Apt changed to a hospital follow up

## 2019-01-10 NOTE — Telephone Encounter (Signed)
OK 

## 2019-01-10 NOTE — Telephone Encounter (Signed)
Transition Care Management Follow-up Telephone Call  Date of discharge and from where: [facility] on 01/10/19  How have you been since you were released from the hospital? Doing better, still having SOB but pt is unsure if its coming from [facility] or something else. [facility]/19. Appetite is better.  Any questions or concerns? No   Items Reviewed:  Did the pt receive and understand the discharge instructions provided? Yes   Medications obtained and verified? Wife declined reviewing all medications over the phone but she did verify new and changed medications.  Any new allergies since your discharge? No   Dietary orders reviewed? Yes  Do you have support at home? Yes   Other (ie: DME, Home Health, etc) N/A  Functional Questionnaire: (I = Independent and D = Dependent)  Bathing/Dressing- I   Meal Prep- I  Eating- I  Maintaining continence- I  Transferring/Ambulation- I  Managing Meds- I   Follow up appointments reviewed:    PCP Hospital f/u appt confirmed? Yes  Scheduled to see Dr. Caryn Section on 01/13/19 @ 10:00 AM (CPE).  [facility]  Are transportation arrangements needed? No   If their condition worsens, is the pt aware to call  their PCP or go to the ED? Yes  Was the patient provided with contact information for the PCP's office or ED? Yes  Was the pt encouraged to call back with questions or concerns? Yes

## 2019-01-10 NOTE — Telephone Encounter (Signed)
Would it be okay to change is CPE to a Hospital follow up?  Thanks,   -Mickel Baas

## 2019-01-13 ENCOUNTER — Ambulatory Visit (INDEPENDENT_AMBULATORY_CARE_PROVIDER_SITE_OTHER): Payer: PPO | Admitting: Family Medicine

## 2019-01-13 ENCOUNTER — Encounter: Payer: Self-pay | Admitting: Family Medicine

## 2019-01-13 VITALS — BP 130/68 | HR 63 | Temp 98.9°F | Resp 18 | Wt 232.0 lb

## 2019-01-13 DIAGNOSIS — G2581 Restless legs syndrome: Secondary | ICD-10-CM

## 2019-01-13 DIAGNOSIS — M9979 Connective tissue and disc stenosis of intervertebral foramina of abdomen and other regions: Secondary | ICD-10-CM | POA: Diagnosis not present

## 2019-01-13 DIAGNOSIS — K219 Gastro-esophageal reflux disease without esophagitis: Secondary | ICD-10-CM

## 2019-01-13 DIAGNOSIS — J449 Chronic obstructive pulmonary disease, unspecified: Secondary | ICD-10-CM

## 2019-01-13 DIAGNOSIS — Z0000 Encounter for general adult medical examination without abnormal findings: Secondary | ICD-10-CM

## 2019-01-13 DIAGNOSIS — G7000 Myasthenia gravis without (acute) exacerbation: Secondary | ICD-10-CM | POA: Diagnosis not present

## 2019-01-13 DIAGNOSIS — F1190 Opioid use, unspecified, uncomplicated: Secondary | ICD-10-CM

## 2019-01-13 DIAGNOSIS — I10 Essential (primary) hypertension: Secondary | ICD-10-CM

## 2019-01-13 MED ORDER — HYDROCODONE-ACETAMINOPHEN 5-325 MG PO TABS: 1.0000 | ORAL_TABLET | Freq: Four times a day (QID) | ORAL | 0 refills | Status: DC | PRN

## 2019-01-13 NOTE — Progress Notes (Signed)
Patient: Joseph Macdonald, Male    DOB: Mar 30, 1946, 73 y.o.   MRN: [id number] Visit Date: 01/13/2019  Today's Provider: Lelon Huh, MD   Chief Complaint  Patient presents with  . Hospitalization Follow-up  . Annual Exam   Subjective:     Annual physical exam Joseph Macdonald is a 73 y.o. male who presents today for health maintenance and complete physical. He feels well. He reports exercising as tolerated. He reports he is sleeping fairly well.  He is followed at [facility] for Myasthenia Gravis, COPD, hypertension, and hyperlipidemia.   He does have chronic back and hip pain and reports that current dose of hydrocodone/apap works well, he usually take 2-3 every day.   -----------------------------------------------------------------  Follow up Hospitalization  Patient was admitted to [facility] on 01/02/2019 and discharged on 01/09/2019. He underwent a plasma exchange for myasthenia crisis and tachycardia. Both of these conditions are managed at [facility] and he has follow up this afternoon. . Telephone follow up was done on 01/10/2019 He reports good compliance with treatment. He reports this condition is Improved. States he has been very fatigued, with double vision and trouble swallowing which has now resolved.  ------------------------------------------------------------------------------------   Review of Systems  Constitutional: Negative for chills, diaphoresis and fever.  HENT: Negative for congestion, ear discharge, ear pain, hearing loss, nosebleeds, sore throat and tinnitus.   Eyes: Negative for photophobia, pain, discharge and redness.  Respiratory: Negative for cough, shortness of breath, wheezing and stridor.   Cardiovascular: Negative for chest pain, palpitations and leg swelling.  Gastrointestinal: Negative for abdominal pain, blood in stool, constipation, diarrhea, nausea and vomiting.  Endocrine: Negative for polydipsia.  Genitourinary: Negative for dysuria,  flank pain, frequency, hematuria and urgency.  Musculoskeletal: Negative for back pain, myalgias and neck pain.  Skin: Negative for rash.  Allergic/Immunologic: Negative for environmental allergies.  Neurological: Negative for dizziness, tremors, seizures, weakness and headaches.  Hematological: Does not bruise/bleed easily.  Psychiatric/Behavioral: Negative for hallucinations and suicidal ideas. The patient is not nervous/anxious.     Social History      He  reports that he quit smoking about 16 months ago. His smoking use included pipe and cigarettes. He smoked 0.50 packs per day. He has quit using smokeless tobacco. He reports current alcohol use. He reports that he does not use drugs.       Social History   Socioeconomic History  . Marital status: Married    Spouse name: Not on file  . Number of children: 1  . Years of education: H/S  . Highest education level: High school graduate  Occupational History  . Occupation: Retired [employer] and [place]  . Financial resource strain: Not hard at all  . Food insecurity:    Worry: Never true    Inability: Never true  . Transportation needs:    Medical: No    Non-medical: No  Tobacco Use  . Smoking status: Former Smoker    Packs/day: 0.50    Types: Pipe, Cigarettes    Last attempt to quit: 08/19/2017    Years since quitting: 1.4  . Smokeless tobacco: Former Systems developer  . Tobacco comment: previously smoked 1 1/2 pack since his 73 years old  Substance and Sexual Activity  . Alcohol use: Yes    Comment: occasionally  . Drug use: No  . Sexual activity: Not on file  Lifestyle  . Physical activity:    Days per week: 0 days  Minutes per session: 0 min  . Stress: Not at all  Relationships  . Social connections:    Talks on phone: Patient refused    Gets together: Patient refused    Attends religious service: Patient refused    Active member of club or organization: Patient refused    Attends meetings of clubs or  organizations: Patient refused    Relationship status: Patient refused  Other Topics Concern  . Not on file  Social History Narrative   2 adopted children    Past Medical History:  Diagnosis Date  . Hypertension      Patient Active Problem List   Diagnosis Date Noted  . Pre-diabetes 03/19/2017  . Chronic narcotic use 01/17/2016  . Myasthenia gravis ([place]) 09/17/2015  . Anxiety 08/17/2015  . 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 Clinical depression 08/17/2015  . Acid reflux 08/17/2015  . Hemorrhoid 08/17/2015  . Essential hypertension 08/17/2015  . Bad memory 08/17/2015  . 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 Allergic rhinitis, seasonal 08/17/2015  . Smoking greater than 30 pack years 08/17/2015  . Restless leg syndrome 08/17/2015    Past Surgical History:  Procedure Laterality Date  . carpel tunnel sx    . CATARACT EXTRACTION     right eye  . SKIN GRAFT     Behind left knee  . TONSILLECTOMY AND ADENOIDECTOMY      Family History        Family Status  Relation Name Status  . Mother  Deceased at age 42  . Father  Deceased at age 4  . Sister  Alive  . Sister  Alive  . Other  (Not Specified)        His family history includes Arthritis in his father, mother, and another family member; Heart disease (age of onset: 30) in his father; Hypertension in his mother; Kidney disease in his mother; Stroke in his father; Thyroid disease in his mother.      No Known Allergies   Current Outpatient Medications:  .  albuterol (PROVENTIL HFA;VENTOLIN HFA) 108 (90 Base) MCG/ACT inhaler, Inhale into the lungs every 6 (six) hours as needed for wheezing or shortness of breath., Disp: , Rfl:  .  amLODipine (NORVASC) 5 MG tablet, Take 5 mg by mouth daily. , Disp: , Rfl:  .  hydrochlorothiazide (MICROZIDE) 12.5 MG capsule, Take 12.5 mg daily by mouth. , Disp: , Rfl:  .  HYDROcodone-acetaminophen (NORCO/VICODIN) 5-325 MG tablet, Take 1  tablet by mouth every 6 (six) hours as needed for moderate pain., Disp: 120 tablet, Rfl: 0 .  mycophenolate (CELLCEPT) 250 MG capsule, Take 1,000 mg by mouth 2 (two) times daily., Disp: , Rfl:  .  omeprazole (PRILOSEC) 40 MG capsule, Take 40 mg by mouth daily., Disp: , Rfl:  .  pramipexole (MIRAPEX) 0.5 MG tablet, Take 1 tablet (0.5 mg total) by mouth at bedtime., Disp: 30 tablet, Rfl: 11 .  pravastatin (PRAVACHOL) 20 MG tablet, Take 20 mg by mouth daily., Disp: , Rfl:  .  predniSONE (DELTASONE) 20 MG tablet, Take 60 mg by mouth every other day. , Disp: , Rfl:  .  pyridostigmine (MESTINON) 60 MG tablet, Take 60 mg by mouth 3 (three) times daily. , Disp: , Rfl:  .  tiotropium (SPIRIVA) 18 MCG inhalation capsule, Place 18 mcg into inhaler and inhale daily., Disp: , Rfl:  .  tolnaftate (TINACTIN) 1 % cream, Apply 1 application topically 2 (two) times daily. ,  Disp: , Rfl:  .  vitamin B-12 (CYANOCOBALAMIN) 1000 MCG tablet, Take 1,000 mcg by mouth daily., Disp: , Rfl:    Patient Care Team: [name], MD as PCP - General (Family Medicine) Desiree Hane, [place] as Consulting Physician (Physician Assistant)    Objective:    Vitals: BP 130/68 (BP Location: Left Arm, Patient Position: Sitting, Cuff Size: Large)   Pulse 63   Temp 98.9 F (37.2 C) (Oral)   Resp 18   Wt 232 lb (105.2 kg)   SpO2 96% Comment: room air  BMI 37.45 kg/m    Vitals:   01/13/19 1009  BP: 130/68  Pulse: 63  Resp: 18  Temp: 98.9 F (37.2 C)  TempSrc: Oral  SpO2: 96%  Weight: 232 lb (105.2 kg)     Physical Exam   General Appearance:    Alert, cooperative, no distress, appears stated age, obese  Head:    Normocephalic, without obvious abnormality, atraumatic  Eyes:    PERRL, conjunctiva/corneas clear, EOM's intact, fundi    benign, both eyes       Ears:    Normal TM's and external ear canals, both ears  Nose:   Nares normal, septum midline, mucosa normal, no drainage   or sinus tenderness  Throat:   Lips,  mucosa, and tongue normal; teeth and gums normal  Neck:   Supple, symmetrical, trachea midline, no adenopathy;       thyroid:  No enlargement/tenderness/nodules; no carotid   bruit or JVD  Back:     Symmetric, no curvature, ROM normal, no CVA tenderness  Lungs:     Clear to auscultation bilaterally, respirations unlabored  Chest wall:    No tenderness or deformity  Heart:    Regular rate and rhythm, S1 and S2 normal, no murmur, rub   or gallop  Abdomen:     Soft, non-tender, bowel sounds active all four quadrants,    no masses, no organomegaly  Genitalia:    deferred  Rectal:    deferred  Extremities:   Extremities normal, atraumatic, no cyanosis or edema  Pulses:   2+ and symmetric all extremities  Skin:   Skin color, texture, turgor normal, no rashes or lesions  Lymph nodes:   Cervical, supraclavicular, and axillary nodes normal  Neurologic:   CNII-XII intact. Normal strength, sensation and reflexes      throughout    Depression Screen PHQ 2/9 Scores 10/28/2018 10/25/2017 08/28/2017 05/15/2016  PHQ - 2 Score 0 0 0 0  PHQ- 9 Score - 2 0 -       Assessment & Plan:     Routine Health Maintenance and Physical Exam  Exercise Activities and Dietary recommendations Goals    . Reduce portion size     Recommend cutting portion sizes in half and eating 3 small meals a day with 2 healthy snacks.        Immunization History  Administered Date(s) Administered  . Influenza, High Dose Seasonal PF 08/21/2015, 09/12/2016, 08/28/2017, 09/11/2018  . Pneumococcal Conjugate-13 03/11/2014    Health Maintenance  Topic Date Due  . Hepatitis C Screening  11-16-1946  . TETANUS/TDAP  02/20/1966  . PNA vac Low Risk Adult (2 of 2 - PPSV23) 03/12/2015  . COLONOSCOPY  05/08/2017  . INFLUENZA VACCINE  Completed     Discussed health benefits of physical activity, and encouraged him to engage in regular exercise appropriate for his age and condition.        Clinical Support from 10/28/2018  in [facility]  PHQ-2 Total Score  0      --------------------------------------------------------------------  1. Annual physical exam Obese, otherwise unremarkable exam. He states he has had several vaccine at the [place], which he thinks includes shingles vaccine and pneumonia vaccines.   2. Essential hypertension Fairly well controlled. Medications dispensed through the [place]  3. Gastroesophageal reflux disease, esophagitis presence not specified Well controlled on omeprazole dispensed through [place]  4. Myasthenia gravis ([place]) Much improve after recent plasma exchanged done at [facility]. Follow up [facility] neurology as scheduled.   5. Chronic narcotic use Doing well on current regiment of hydrocodone/apap   6. Narrowing of intervertebral disc space  - HYDROcodone-acetaminophen (NORCO/VICODIN) 5-325 MG tablet; Take 1 tablet by mouth every 6 (six) hours as needed for moderate pain.  Dispense: 120 tablet; Refill: 0  7. Chronic obstructive pulmonary disease, unspecified COPD type ([place]) He reports today that he takes Spiriva and albuterol prescribed from the [place]. Medication list is updated.   8. rls He reports that Mirapex continues to work well, is well tolerated and he wishes to continue.   After visit was finished and patient was being discharge, patient's daughter Hinton Dyer approached me stating she and the patients wife have been concerned about patient's memory. He is followed by neurology at [facility] and encouraged to discuss with them. She states pain gets very upset when they bring it up. Advised we could follow up on this at next o.v. if it is not addressed at the [place].    Lelon Huh, MD  [facility]

## 2019-01-13 NOTE — Progress Notes (Deleted)
       Patient: Joseph Macdonald Male    DOB: 09/30/1946   73 y.o.   MRN: [id number] Visit Date: 01/13/2019  Today's Provider: Lelon Huh, MD   Chief Complaint  Patient presents with  . Hospitalization Follow-up   Subjective:     HPI  Follow up Hospitalization  Patient was admitted to [facility] on 01/02/2019 and discharged on 01/09/2019. He underwent a plasma exchange. Patient was found to have Ventricular Tachycardia while in the hospital. Telephone follow up was done on 01/10/2019 He reports good compliance with treatment. He reports this condition is Improved. Patient has an appointment to follow up with the [facility] today. ------------------------------------------------------------------------------------    No Known Allergies   Current Outpatient Medications:  .  amLODipine (NORVASC) 5 MG tablet, Take 5 mg by mouth daily. , Disp: , Rfl:  .  hydrochlorothiazide (MICROZIDE) 12.5 MG capsule, Take 12.5 mg daily by mouth. , Disp: , Rfl:  .  HYDROcodone-acetaminophen (NORCO/VICODIN) 5-325 MG tablet, Take 1 tablet by mouth every 6 (six) hours as needed for moderate pain., Disp: 120 tablet, Rfl: 0 .  mycophenolate (CELLCEPT) 250 MG capsule, Take 1,000 mg by mouth 2 (two) times daily., Disp: , Rfl:  .  omeprazole (PRILOSEC) 40 MG capsule, Take 40 mg by mouth daily., Disp: , Rfl:  .  pramipexole (MIRAPEX) 0.5 MG tablet, Take 1 tablet (0.5 mg total) by mouth at bedtime., Disp: 30 tablet, Rfl: 11 .  pravastatin (PRAVACHOL) 20 MG tablet, Take 20 mg by mouth daily., Disp: , Rfl:  .  predniSONE (DELTASONE) 20 MG tablet, Take 60 mg by mouth every other day. , Disp: , Rfl:  .  pyridostigmine (MESTINON) 60 MG tablet, Take 60 mg by mouth 3 (three) times daily. , Disp: , Rfl:  .  tolnaftate (TINACTIN) 1 % cream, Apply 1 application topically 2 (two) times daily. , Disp: , Rfl:  .  vitamin B-12 (CYANOCOBALAMIN) 1000 MCG tablet, Take 1,000 mcg by mouth daily., Disp: , Rfl:   Review of  Systems  Constitutional: Negative for appetite change, chills and fever.  Respiratory: Positive for shortness of breath. Negative for chest tightness and wheezing.   Cardiovascular: Positive for leg swelling (left lower leg and foot). Negative for chest pain and palpitations.  Gastrointestinal: Negative for abdominal pain, nausea and vomiting.    Social History   Tobacco Use  . Smoking status: Former Smoker    Packs/day: 0.50    Types: Pipe, Cigarettes    Last attempt to quit: 08/19/2017    Years since quitting: 1.4  . Smokeless tobacco: Former Systems developer  . Tobacco comment: previously smoked 1 1/2 pack since his 73 years old  Substance Use Topics  . Alcohol use: Yes    Comment: occasionally      Objective:   BP 130/68 (BP Location: Left Arm, Patient Position: Sitting, Cuff Size: Large)   Pulse 63   Temp 98.9 F (37.2 C) (Oral)   Resp 18   Wt 232 lb (105.2 kg)   SpO2 96% Comment: room air  BMI 37.45 kg/m  Vitals:   01/13/19 1009  BP: 130/68  Pulse: 63  Resp: 18  Temp: 98.9 F (37.2 C)  TempSrc: Oral  SpO2: 96%  Weight: 232 lb (105.2 kg)     Physical Exam      Assessment & Plan        Lelon Huh, MD  [facility]

## 2019-01-13 NOTE — Patient Instructions (Addendum)
.   Please review the attached list of medications and notify my office if there are any errors.   . Please bring all of your medications to every appointment so we can make sure that our medication list is the same as yours.    See if you can get a copy of your immunization records and lab work from the [place] so we can update your medical record

## 2019-01-15 ENCOUNTER — Encounter: Payer: Self-pay | Admitting: Internal Medicine

## 2019-03-03 ENCOUNTER — Other Ambulatory Visit: Payer: Self-pay

## 2019-03-03 DIAGNOSIS — M9979 Connective tissue and disc stenosis of intervertebral foramina of abdomen and other regions: Secondary | ICD-10-CM

## 2019-03-03 MED ORDER — HYDROCODONE-ACETAMINOPHEN 5-325 MG PO TABS: 1.0000 | ORAL_TABLET | Freq: Four times a day (QID) | ORAL | 0 refills | Status: DC | PRN

## 2019-03-08 ENCOUNTER — Encounter: Payer: Self-pay | Admitting: Internal Medicine

## 2019-03-10 DIAGNOSIS — B354 Tinea corporis: Secondary | ICD-10-CM | POA: Diagnosis not present

## 2019-03-11 ENCOUNTER — Telehealth: Payer: Self-pay | Admitting: *Deleted

## 2019-03-11 NOTE — Telephone Encounter (Signed)
      Cardiac Questionnaire:    Since your last visit or hospitalization:    1. Have you been having new or worsening chest pain? YES, patient recently diagnosed with VT at the [place]. Cannot see cardiologist there until June. He's been having chest tightness since then. Will be bringing copies of his records and recent testing.     2. Have you been having new or worsening shortness of breath?  NO 3. Have you been having new or worsening leg swelling, wt gain, or increase in abdominal girth (pants fitting more tightly)? NO   4. Have you had any passing out spells? NO    *A YES to any of these questions would result in the appointment being kept. *If all the answers to these questions are NO, we should indicate that given the current situation regarding the worldwide coronarvirus pandemic, at the recommendation of the CDC, we are looking to limit gatherings in our waiting area, and thus will reschedule their appointment beyond four weeks from today.   _____________   [id number]-[id number] Pre-Screening Questions:  . Do you currently have a fever? NO (yes = cancel and refer to pcp for e-visit) . Have you recently travelled on a cruise, internationally, or to [place], [place], [place], [place], [place], or [place], [place] [employer]) ? NO (yes = cancel, stay home, monitor symptoms, and contact pcp or initiate e-visit if symptoms develop) . Have you been in contact with someone that is currently pending confirmation of Covid19 testing or has been confirmed to have the [place] virus?  NO (yes = cancel, stay home, away from tested individual, monitor symptoms, and contact pcp or initiate e-visit if symptoms develop) . Are you currently experiencing fatigue or cough? NO (yes = pt should be prepared to have a mask placed at the time of their visit).

## 2019-03-12 ENCOUNTER — Ambulatory Visit (INDEPENDENT_AMBULATORY_CARE_PROVIDER_SITE_OTHER): Payer: PPO | Admitting: Internal Medicine

## 2019-03-12 ENCOUNTER — Other Ambulatory Visit: Payer: Self-pay

## 2019-03-12 ENCOUNTER — Encounter: Payer: Self-pay | Admitting: Internal Medicine

## 2019-03-12 ENCOUNTER — Telehealth: Payer: Self-pay | Admitting: *Deleted

## 2019-03-12 VITALS — BP 138/66 | HR 63 | Ht 67.0 in | Wt 233.5 lb

## 2019-03-12 DIAGNOSIS — Z125 Encounter for screening for malignant neoplasm of prostate: Secondary | ICD-10-CM | POA: Diagnosis not present

## 2019-03-12 DIAGNOSIS — E559 Vitamin D deficiency, unspecified: Secondary | ICD-10-CM | POA: Diagnosis not present

## 2019-03-12 DIAGNOSIS — Z23 Encounter for immunization: Secondary | ICD-10-CM | POA: Diagnosis not present

## 2019-03-12 DIAGNOSIS — E669 Obesity, unspecified: Secondary | ICD-10-CM | POA: Diagnosis not present

## 2019-03-12 DIAGNOSIS — I5033 Acute on chronic diastolic (congestive) heart failure: Secondary | ICD-10-CM | POA: Diagnosis not present

## 2019-03-12 DIAGNOSIS — E785 Hyperlipidemia, unspecified: Secondary | ICD-10-CM | POA: Diagnosis not present

## 2019-03-12 DIAGNOSIS — I472 Ventricular tachycardia, unspecified: Secondary | ICD-10-CM | POA: Insufficient documentation

## 2019-03-12 DIAGNOSIS — R5383 Other fatigue: Secondary | ICD-10-CM | POA: Diagnosis not present

## 2019-03-12 DIAGNOSIS — I5032 Chronic diastolic (congestive) heart failure: Secondary | ICD-10-CM | POA: Insufficient documentation

## 2019-03-12 DIAGNOSIS — R079 Chest pain, unspecified: Secondary | ICD-10-CM | POA: Diagnosis not present

## 2019-03-12 DIAGNOSIS — J9601 Acute respiratory failure with hypoxia: Secondary | ICD-10-CM | POA: Diagnosis not present

## 2019-03-12 DIAGNOSIS — I10 Essential (primary) hypertension: Secondary | ICD-10-CM

## 2019-03-12 DIAGNOSIS — Z0000 Encounter for general adult medical examination without abnormal findings: Secondary | ICD-10-CM | POA: Diagnosis not present

## 2019-03-12 DIAGNOSIS — R12 Heartburn: Secondary | ICD-10-CM | POA: Diagnosis not present

## 2019-03-12 DIAGNOSIS — E1169 Type 2 diabetes mellitus with other specified complication: Secondary | ICD-10-CM | POA: Diagnosis not present

## 2019-03-12 DIAGNOSIS — R7309 Other abnormal glucose: Secondary | ICD-10-CM | POA: Diagnosis not present

## 2019-03-12 DIAGNOSIS — I4891 Unspecified atrial fibrillation: Secondary | ICD-10-CM | POA: Diagnosis not present

## 2019-03-12 MED ORDER — HYDROCHLOROTHIAZIDE 25 MG PO TABS: 25.0000 mg | ORAL_TABLET | Freq: Every day | ORAL | 3 refills | Status: DC

## 2019-03-12 MED ORDER — FUROSEMIDE 40 MG PO TABS: 40.0000 mg | ORAL_TABLET | Freq: Every day | ORAL | 0 refills | Status: DC

## 2019-03-12 MED ORDER — AMLODIPINE BESYLATE 5 MG PO TABS: 5.0000 mg | ORAL_TABLET | Freq: Every day | ORAL | 3 refills | Status: DC

## 2019-03-12 NOTE — Patient Instructions (Signed)
Medication Instructions:  Your physician has recommended you make the following change in your medication:  1- DECREASE Amlodipine to 5 mg by mouth once a day. 2- DECREASE Hydrochlorothiazide to 25 mg by mouth once a day. 3- TAKE Furosemide 40 mg (1 tablet) by mouth once a day for 1 week.  If you need a refill on your cardiac medications before your next appointment, please call your pharmacy.   Lab work: none If you have labs (blood work) drawn today and your tests are completely normal, you will receive your results only by: Marland Kitchen MyChart Message (if you have MyChart) OR . A paper copy in the mail If you have any lab test that is abnormal or we need to change your treatment, we will call you to review the results.  Testing/Procedures: none  Follow-Up: At [facility], you and your health needs are our priority.  As part of our continuing mission to provide you with exceptional heart care, we have created designated Provider Care Teams.  These Care Teams include your primary Cardiologist (physician) and Advanced Practice Providers (APPs -  Physician Assistants and Nurse Practitioners) who all work together to provide you with the care you need, when you need it. You will need a follow up appointment in 1-2 weeks as a Virtual VIsit with Dr End or APP.  You may see DR Harrell Gave ENDor one of the following Advanced Practice Providers on your designated Care Team:   Murray Hodgkins, NP Christell Faith, PA-C . Marrianne Mood, PA-C   We will need you to sign a consent for [place] to receive your medical records from [place] and the [place].           Virtual Visit Pre-Appointment Phone Call  Steps For Call:  1. Confirm consent - "In the setting of the current Covid19 crisis, you are scheduled for a (phone or video) visit with your provider on (date) at (time).  Just as we do with many in-office visits, in order for you to participate in this visit, we must obtain consent.  If you'd like, I can  send this to your mychart (if signed up) or email for you to review.  Otherwise, I can obtain your verbal consent now.  All virtual visits are billed to your insurance company just like a normal visit would be.  By agreeing to a virtual visit, we'd like you to understand that the technology does not allow for your provider to perform an examination, and thus may limit your provider's ability to fully assess your condition.  Finally, though the technology is pretty good, we cannot assure that it will always work on either your or our end, and in the setting of a video visit, we may have to convert it to a phone-only visit.  In either situation, we cannot ensure that we have a secure connection.  Are you willing to proceed?"  2. Give patient instructions for WebEx download to smartphone as below if video visit  3. Advise patient to be prepared with any vital sign or heart rhythm information, their current medicines, and a piece of paper and pen handy for any instructions they may receive the day of their visit  4. Inform patient they will receive a phone call 15 minutes prior to their appointment time (may be from unknown caller ID) so they should be prepared to answer  5. Confirm that appointment type is correct in Epic appointment notes (video vs telephone)    TELEPHONE CALL NOTE  Joseph John  C Macdonald has been deemed a candidate for a follow-up tele-health visit to limit community exposure during the Covid-19 pandemic. I spoke with the patient via phone to ensure availability of phone/video source, confirm preferred email & phone number, and discuss instructions and expectations.  I reminded Joseph Macdonald to be prepared with any vital sign and/or heart rhythm information that could potentially be obtained via home monitoring, at the time of his visit. I reminded Joseph Macdonald to expect a phone call at the time of his visit if his visit.  Did the patient verbally acknowledge consent to treatment?  pending  Roney Jaffe, RN 03/12/2019 11:37 AM   DOWNLOADING THE [place], go to [employer] and type in WebEx in the search bar. [place] [employer], the blue/green circle. The app is free but as with any other app downloads, their phone may require them to verify saved payment information or Apple password. The patient does NOT have to create an account.  - If Android, ask patient to go to [employer] and type in WebEx in the search bar. [place] [employer], the blue/green circle. The app is free but as with any other app downloads, their phone may require them to verify saved payment information or Android password. The patient does NOT have to create an account.   CONSENT FOR TELE-HEALTH VISIT - PLEASE REVIEW  I hereby voluntarily request, consent and authorize [facility] and its employed or contracted physicians, physician assistants, nurse practitioners or other licensed health care professionals (the Practitioner), to provide me with telemedicine health care services (the "Services") as deemed necessary by the treating Practitioner. I acknowledge and consent to receive the Services by the Practitioner via telemedicine. I understand that the telemedicine visit will involve communicating with the Practitioner through live audiovisual communication technology and the disclosure of certain medical information by electronic transmission. I acknowledge that I have been given the opportunity to request an in-person assessment or other available alternative prior to the telemedicine visit and am voluntarily participating in the telemedicine visit.  I understand that I have the right to withhold or withdraw my consent to the use of telemedicine in the course of my care at any time, without affecting my right to future care or treatment, and that the Practitioner or I may terminate the telemedicine visit at any time. I understand that I  have the right to inspect all information obtained and/or recorded in the course of the telemedicine visit and may receive copies of available information for a reasonable fee.  I understand that some of the potential risks of receiving the Services via telemedicine include:  Marland Kitchen Delay or interruption in medical evaluation due to technological equipment failure or disruption; . Information transmitted may not be sufficient (e.g. poor resolution of images) to allow for appropriate medical decision making by the Practitioner; and/or  . In rare instances, security protocols could fail, causing a breach of personal health information.  Furthermore, I acknowledge that it is my responsibility to provide information about my medical history, conditions and care that is complete and accurate to the best of my ability. I acknowledge that Practitioner's advice, recommendations, and/or decision may be based on factors not within their control, such as incomplete or inaccurate data provided by me or distortions of diagnostic images or specimens that may result from electronic transmissions. I understand that the practice of medicine is not an exact science and that Practitioner  makes no warranties or guarantees regarding treatment outcomes. I acknowledge that I will receive a copy of this consent concurrently upon execution via email to the email address I last provided but may also request a printed copy by calling the office of [place].    I understand that my insurance will be billed for this visit.   I have read or had this consent read to me. . I understand the contents of this consent, which adequately explains the benefits and risks of the Services being provided via telemedicine.  . I have been provided ample opportunity to ask questions regarding this consent and the Services and have had my questions answered to my satisfaction. . I give my informed consent for the services to be provided through the  use of telemedicine in my medical care  By participating in this telemedicine visit I agree to the above.

## 2019-03-12 NOTE — Progress Notes (Signed)
New Outpatient Visit Date: 03/12/2019  Primary Care provider: [name], [address], [place] [address]  Chief Complaint: Swelling  HPI:  Joseph Macdonald is a 73 y.o. male who is being seen today as a self-referral for the evaluation of shortness of breath as well as leg and facial swelling. He has a history of hypertension and myasthenia gravis complicated by myasthenia crisis requiring plasma exchange at [facility] in 12/2018 and followed chronically at the [facility].  Joseph Macdonald presents today for second opinion regarding his cardiac prognosis.  He brings records with him from his hospitalization at the [facility] in 12/2018.  He presented with marked weakness consistent with a myasthenia crisis.  He was transferred to [facility] for plasma exchange and was noted to have episodes of nonsustained ventricular tachycardia.  He was referred for Holter monitor though it appears that at the time that he presented for monitor placement, he was found to be in a wide-complex tachycardia (again asymptomatic).  He was admitted to the [facility] ICU and initiated on amiodarone.  Extensive work-up including echo, left heart catheterization, and cardiac MRI showed no evidence of cardiomyopathy or significant CAD.  He has not followed with cardiology since and is concerned that his next visit with a cardiology provider is not until June.  Joseph Macdonald' reports months to years of intermittent exertional chest discomfort, feeling as though somebody is pressing on his chest.  He has not had any palpitations but notes intermittent orthostatic lightheadedness as well as disequilibrium.  He has chronic exertional dyspnea without orthopnea.  However, he notes intermittent PND.  Over the last few weeks, his exertional dyspnea has worsened, which prompted him to go to the [facility] emergency department over the weekend for further evaluation.  He has also developed worsening  leg swelling over the last 3 to 6 months accompanied by a migratory rash that initially began on his legs but has now spread to the torso.  He was recently evaluated by a dermatologist and started on treatment for a suspected fungal rash.  He also notes having gained about 15 pounds in the last 3 to 4 months.  Joseph Macdonald blood pressure has been somewhat labile, though recently somewhat low.  His wife reports readings of 110-140/50-60.  Of note, amlodipine was increased from 5 mg to 10 mg daily in 12/2018.  Due to worsening shortness of breath, Joseph Macdonald presented to the [facility] emergency department this past weekend; hydrochlorothiazide was increased from 25 to 50 mg daily.  Albuterol was also increased.  He has noted transient improvement in the shortness of breath after using albuterol.  His leg edema and weight have not changed significantly with escalation of HCTZ.  --------------------------------------------------------------------------------------------------  Cardiovascular History & Procedures: Cardiovascular Problems:  Ventricular tachycardia  Risk Factors:  Hypertension, hyperlipidemia, tobacco use, male gender, obesity, and age greater than 30  Cath/PCI:  LHC (12/2018, [place]): Reportedly no significant CAD.  CV Surgery:  None.  EP Procedures and Devices:  None available.  Telemetry/Holter monitor reports suggest ventricular tachycardia, some of which may be sustained.  Non-Invasive Evaluation(s):  Cardiac MRI and transthoracic echocardiogram (12/2018, [facility]): Reportedly normal LVEF.  Recent CV Pertinent Labs: Lab Results  Component Value Date   CHOL 171 12/21/2015   HDL 89 (A) 12/21/2015   CREATININE 0.9 12/21/2015    --------------------------------------------------------------------------------------------------  Past Medical History:  Diagnosis Date  . Hyperlipidemia   . Hypertension   .  Myasthenia gravis ([place])   . SVT  (supraventricular tachycardia) ([facility])     Past Surgical History:  Procedure Laterality Date  . carpel tunnel sx    . CATARACT EXTRACTION     right eye  . SKIN GRAFT     Behind left knee  . TONSILLECTOMY AND ADENOIDECTOMY      Current Meds  Medication Sig  . albuterol (PROVENTIL HFA;VENTOLIN HFA) 108 (90 Base) MCG/ACT inhaler Inhale into the lungs every 6 (six) hours as needed for wheezing or shortness of breath.  Marland Kitchen amiodarone (PACERONE) 400 MG tablet Take 400 mg by mouth daily.  Marland Kitchen aspirin EC 81 MG tablet Take 81 mg by mouth daily.  . carboxymethylcellulose 1 % ophthalmic solution 1 drop as needed.  . famotidine (PEPCID) 20 MG tablet Take 20 mg by mouth as needed for heartburn or indigestion.  . gabapentin (NEURONTIN) 300 MG capsule Take 600 mg by mouth at bedtime.  Marland Kitchen HYDROcodone-acetaminophen (NORCO/VICODIN) 5-325 MG tablet Take 1 tablet by mouth every 6 (six) hours as needed for moderate pain.  Marland Kitchen omeprazole (PRILOSEC) 40 MG capsule Take 40 mg by mouth daily.  . pramipexole (MIRAPEX) 0.5 MG tablet Take 1 tablet (0.5 mg total) by mouth at bedtime.  . pravastatin (PRAVACHOL) 20 MG tablet Take 20 mg by mouth daily.  . predniSONE (DELTASONE) 20 MG tablet Take 50 mg by mouth every other day.   . pyridostigmine (MESTINON) 60 MG tablet Take 60 mg by mouth 3 (three) times daily.   Marland Kitchen tiotropium (SPIRIVA) 18 MCG inhalation capsule Place 18 mcg into inhaler and inhale daily.  Marland Kitchen tolnaftate (TINACTIN) 1 % cream Apply 1 application topically 2 (two) times daily as needed.   . vitamin B-12 (CYANOCOBALAMIN) 1000 MCG tablet Take 1,000 mcg by mouth daily.  . [DISCONTINUED] amLODipine (NORVASC) 10 MG tablet Take 10 mg by mouth daily.  . [DISCONTINUED] hydrochlorothiazide (HYDRODIURIL) 50 MG tablet Take 50 mg by mouth daily.    Allergies: Patient has no known allergies.  Social History   Tobacco Use  . Smoking status: Former Smoker    Packs/day: 0.50    Years: 40.00    Pack years: 20.00     Types: Pipe, Cigarettes    Last attempt to quit: 08/19/2017    Years since quitting: 1.5  . Smokeless tobacco: Former Systems developer  . Tobacco comment: previously smoked 1 1/2 pack since his 73 years old  Substance Use Topics  . Alcohol use: Yes    Comment: occasionally  . Drug use: No    Family History  Problem Relation Age of Onset  . Hypertension Mother   . Thyroid disease Mother   . Kidney disease Mother   . Arthritis Mother   . Stroke Father   . Heart disease Father 75  . Arthritis Father   . Heart attack Father   . Arthritis Other     Review of Systems: Patient notes chronic weakness and fatigue when active for more than 5 to 10 minutes at a time, which he attributes to his myasthenia gravis.  Otherwise, a 12-system review of systems was performed and was negative except as noted in the HPI.  --------------------------------------------------------------------------------------------------  Physical Exam: BP 138/66 (BP Location: Left Arm, Patient Position: Sitting)   Pulse 63   Ht 5\' 7"  (1.702 m)   Wt 233 lb 8 oz (105.9 kg)   BMI 36.57 kg/m   General: Obese man seated comfortably in a wheelchair.  His wife is available throughout the visit by  phone. HEENT: No conjunctival pallor or scleral icterus.  Surgical mask in place, obscuring nose and mouth. Neck: Supple without lymphadenopathy, thyromegaly, JVD, or HJR. No carotid bruit. Lungs: Normal work of breathing. Clear to auscultation bilaterally without wheezes or crackles. Heart: Regular rate and rhythm without murmurs, rubs, or gallops.  Unable to assess PMI due to body habitus. Abd: Bowel sounds present. Soft, NT/ND.  Unable to assess HSM due to body habitus. Ext: 2-3+ pitting edema to the proximal calves bilaterally. Skin: Warm a with confluent maculopapular rash on both calves.  Areas of scaling and mild erythema noted on the chest and upper back as well. Neuro: CNIII-XII intact. Strength and fine-touch sensation intact in  upper and lower extremities bilaterally. Psych: Normal mood and affect.  EKG: Normal sinus rhythm with LAFB and borderline LVH.  No prior tracing available for comparison.  No results found for: WBC, HGB, HCT, MCV, PLT  Lab Results  Component Value Date   CREATININE 0.9 12/21/2015    Lab Results  Component Value Date   CHOL 171 12/21/2015   HDL 89 (A) 12/21/2015    --------------------------------------------------------------------------------------------------  ASSESSMENT AND PLAN: Ventricular tachycardia Unfortunately, details regarding these episodes is limited.  Discharge summary suggest that Joseph Macdonald may have had stable sustained ventricular tachycardia without evidence of significant CAD or structural heart disease.  He remains on amiodarone 400 mg daily, which we will continue for now.  I will request records from [place] in order to better understand what happened during this hospitalization.  If it indeed appears that he had sustained VT, I will refer him to our electrophysiology team for ongoing management (unless he wishes to follow-up with EP at [facility]).  Acute on chronic HFpEF Joseph Macdonald describes worsening edema and exertional dyspnea over the last few months in the setting of normal LVEF by reported TTE and cardiac MRI earlier this year.  Interestingly, he does not have significant orthopnea, though he endorses PND.  I think it would be worthwhile to try decreasing amlodipine to 5 mg daily, in case this is contributing to his leg swelling.  I will also decrease HCTZ to 25 mg daily and add furosemide 40 mg daily for 1 week.  Based on his response to this, ongoing furosemide therapy may be needed.  As he just had labs drawn at the [facility] 4 days ago, I will request these to establish his baseline renal function and potassium.  He will likely need a repeat BMP in 1 to 2 weeks, particularly if we continue with ongoing furosemide therapy.  Hypertension  Blood pressure upper normal today.  As above, we will de-escalate amlodipine and HCTZ but add furosemide.  I have encouraged Joseph Macdonald to monitor his blood pressure at home.  In the short-term, we will tolerate a degree of permissive hypertension.  If his blood pressure is consistently above 140/90, I would favor adding a different agent rather than escalating amlodipine again, particularly if his leg edema improves on the lower dose.  Chest pain Longstanding, exertional chest pressure was noted by Mr. Dimple Nanas.  However, recent catheterization at the [facility] supposedly showed no significant CAD.  We will request records to confirm this.  Follow-up: Virtual visit in 1-2 weeks.  Nelva Bush, MD 03/12/2019 1:05 PM

## 2019-03-12 NOTE — Telephone Encounter (Signed)
Patient here in office for visit. Telehealth consent printed on AVS. Patient reviewed and verbally consented to Telehealth visits.

## 2019-03-18 NOTE — Progress Notes (Signed)
Virtual Visit via Video Note   This visit type was conducted due to national recommendations for restrictions regarding the COVID-19 Pandemic (e.g. social distancing) in an effort to limit this patient's exposure and mitigate transmission in our community.  Due to his co-morbid illnesses, this patient is at least at moderate risk for complications without adequate follow up.  This format is felt to be most appropriate for this patient at this time.  All issues noted in this document were discussed and addressed.  A limited physical exam was performed with this format.  Verbal consent was obtained from the patient.  Video visit was attempted but had to be converted to telephone conversation due to technical issues with the patient's device.  Evaluation Performed:  Follow-up visit  Date:  03/19/2019   ID:  Joseph Macdonald, DOB 19-May-1946, MRN [id number]  Patient Location: Home  Provider Location: Office  PCP:  [name], MD  Cardiologist:  Nelva Bush, MD Electrophysiologist:  None   Chief Complaint: Shortness of breath and leg swelling  History of Present Illness:    Joseph Macdonald is a 73 y.o. male who presents via audio/video conferencing for a telehealth visit today.  He has a history of recently diagnosed ventricular tachycardia (reportedly no significant CAD or myocardial scarring/cardiomyopathy by [facility] and cardiac MRI), hypertension, and myasthenia gravis.  We are following up on his leg edema and ventricular tachycardia.  I met Joseph Macdonald last week regarding a second opinion for his ventricular tachycardia.  There were conflicting reports regarding sustained versus nonsustained ventricular tachycardia during a hospitalization earlier this year at the [facility] and [facility] for myasthenia crisis.  However, I have reviewed notes from EP at the [facility] which indicate 2 to 3 minutes of wide-complex tachycardia consistent with sustained  VT.  We did not make any changes to his amiodarone therapy but he agreed to alter his diuretic regimen and also decrease amlodipine due to progressive leg swelling.  Today, Joseph Macdonald reports that he is doing much better than a week ago.  His ankle swelling has almost completely resolved.  His rash (predominantly on the lower extremities) has also improved since starting furosemide as well as a pill provided by his dermatologist.  He still has exertional dyspnea when walking through [employer], not significantly changed from a few weeks ago.  However, his wife feels like his breathing at night is better.  He denies chest pain, palpitations, and lightheadedness.  He is checking his blood pressure regularly and notes morning readings similar to today's and nighttime readings that are typically 110-120/50-70.  The patient does not have symptoms concerning for COVID-19 infection (fever, chills, cough, or new shortness of breath).    Past Medical History:  Diagnosis Date   Hyperlipidemia    Hypertension    Myasthenia gravis ([place])    SVT (supraventricular tachycardia) ([place])    Past Surgical History:  Procedure Laterality Date   carpel tunnel sx     CATARACT EXTRACTION     right eye   SKIN GRAFT     Behind left knee   TONSILLECTOMY AND ADENOIDECTOMY       Current Meds  Medication Sig   albuterol (PROVENTIL HFA;VENTOLIN HFA) 108 (90 Base) MCG/ACT inhaler Inhale into the lungs every 6 (six) hours as needed for wheezing or shortness of breath.   amiodarone (PACERONE) 400 MG tablet Take 400 mg by mouth daily.   amLODipine (NORVASC) 5 MG tablet Take  1 tablet (5 mg total) by mouth daily.   aspirin EC 81 MG tablet Take 81 mg by mouth daily.   carboxymethylcellulose 1 % ophthalmic solution 1 drop as needed.   famotidine (PEPCID) 20 MG tablet Take 20 mg by mouth as needed for heartburn or indigestion.   furosemide (LASIX) 40 MG tablet Take 1 tablet (40 mg total) by mouth daily.    gabapentin (NEURONTIN) 300 MG capsule Take 600 mg by mouth at bedtime.   hydrochlorothiazide (HYDRODIURIL) 25 MG tablet Take 1 tablet (25 mg total) by mouth daily.   HYDROcodone-acetaminophen (NORCO/VICODIN) 5-325 MG tablet Take 1 tablet by mouth every 6 (six) hours as needed for moderate pain.   mycophenolate (CELLCEPT) 500 MG tablet Take 500 mg by mouth 2 (two) times daily.   omeprazole (PRILOSEC) 40 MG capsule Take 40 mg by mouth daily.   pramipexole (MIRAPEX) 0.5 MG tablet Take 1 tablet (0.5 mg total) by mouth at bedtime.   pravastatin (PRAVACHOL) 20 MG tablet Take 20 mg by mouth daily.   predniSONE (DELTASONE) 20 MG tablet Take 50 mg by mouth every other day.    pyridostigmine (MESTINON) 60 MG tablet Take 60 mg by mouth 3 (three) times daily.    tiotropium (SPIRIVA) 18 MCG inhalation capsule Place 18 mcg into inhaler and inhale daily.   tolnaftate (TINACTIN) 1 % cream Apply 1 application topically 2 (two) times daily as needed.    vitamin B-12 (CYANOCOBALAMIN) 1000 MCG tablet Take 1,000 mcg by mouth daily.     Allergies:   Patient has no known allergies.   Social History   Tobacco Use   Smoking status: Former Smoker    Packs/day: 0.50    Years: 40.00    Pack years: 20.00    Types: Pipe, Cigarettes    Last attempt to quit: 08/19/2017    Years since quitting: 1.5   Smokeless tobacco: Former Systems developer   Tobacco comment: previously smoked 1 1/2 pack since his 73 years old  Substance Use Topics   Alcohol use: Yes    Comment: occasionally   Drug use: No     Family Hx: The patient's family history includes Arthritis in his father, mother, and another family member; Heart attack in his father; Heart disease (age of onset: 11) in his father; Hypertension in his mother; Kidney disease in his mother; Stroke in his father; Thyroid disease in his mother.  ROS:   Please see the history of present illness.  All other systems reviewed and are negative.   Prior CV studies:     The following studies were reviewed today:  Cath/PCI:  LHC (01/15/2019, [facility]):  LMCA normal.  30% mid LAD stenosis.  80% septal branch lesion.  No significant disease involving the LCx or RCA.  LVEDP 20 mmHg.  EP Procedures and Devices:  None available.  Telemetry/Holter monitor reports suggest ventricular tachycardia, some of which may be sustained.  Non-Invasive Evaluation(s):  Cardiac MRI (01/2019, [facility]): Reportedly normal LVEF.  TTE (01/14/2019, [place]): LVEF 55% with normal wall motion.  There is moderate asymmetric LV H with grade 1 diastolic dysfunction.  RV mildly dilated with normal systolic function.  Labs/Other Tests and Data Reviewed:    EKG:  No ECG reviewed.  Recent Labs: No results found for requested labs within last 8760 hours.   Recent Lipid Panel Lab Results  Component Value Date/Time   CHOL 171 12/21/2015   HDL 89 (A) 12/21/2015    Wt Readings from Last 3  Encounters:  03/19/19 233 lb (105.7 kg)  03/12/19 233 lb 8 oz (105.9 kg)  01/13/19 232 lb (105.2 kg)     Objective:    Vital Signs:  BP 138/61 (BP Location: Left Arm, Patient Position: Sitting, Cuff Size: Normal)    Pulse 71    Ht '5\' 7"'$  (1.702 m)    Wt 233 lb (105.7 kg)    BMI 36.49 kg/m     ASSESSMENT & PLAN:    Acute on chronic HFpEF: The weight has not decreased significantly by home scale, lower extremity edema is notably better per Joseph Macdonald' report.  I suspect this is due to a combination of decreasing amlodipine dose and adding furosemide.  I think it is reasonable for Joseph Macdonald to continue furosemide 40 mg p.o. daily until his leg edema has completely resolved and his breathing is at its baseline.  At that point, he can use the furosemide as needed for weight gain/swelling.  I will have him come in for a CMP and magnesium level at this week to ensure that he does not have any significant electrolyte derangements or renal insufficiency in the setting of ongoing diuresis  and history of sustained ventricular tachycardia.  Sustained ventricular tachycardia: Records from the [facility] were reviewed and indicate at least several minutes of sustained wide-complex tachycardia consistent with VT.  We have agreed to continue amiodarone 400 mg daily and reassess electrolytes, as above.  I will also check LFTs and a TSH in the setting of continued amiodarone use.  I will refer Joseph Macdonald to our EP division for long-term management of his ventricular tachycardia.  Shortness of breath: Likely multifactorial, including HFpEF and myasthenia gravis.  Certainly, underlying lung disease is also a consideration.  If breathing does not improve significantly despite continued diuresis, we will refer Joseph Macdonald to pulmonary for further evaluation.  Hypertension: Blood pressure borderline elevated at home this morning but often a lower later in the day.  No medication changes today.  COVID-19 Education: The signs and symptoms of COVID-19 were discussed with the patient and how to seek care for testing (follow up with PCP or arrange E-visit).  The importance of social distancing was discussed today.  Time:   Today, I have spent 11 minutes with the patient with telehealth technology discussing the above problems.     Medication Adjustments/Labs and Tests Ordered: Current medicines are reviewed at length with the patient today.  Concerns regarding medicines are outlined above.   Tests Ordered: No orders of the defined types were placed in this encounter.  Medication Changes: No orders of the defined types were placed in this encounter.   Disposition:  Follow up in 1 month(s)  Signed, Nelva Bush, MD  03/19/2019 10:05 AM    [facility]

## 2019-03-19 ENCOUNTER — Telehealth: Payer: Self-pay | Admitting: *Deleted

## 2019-03-19 ENCOUNTER — Telehealth (INDEPENDENT_AMBULATORY_CARE_PROVIDER_SITE_OTHER): Payer: PPO | Admitting: Internal Medicine

## 2019-03-19 ENCOUNTER — Encounter: Payer: Self-pay | Admitting: Internal Medicine

## 2019-03-19 ENCOUNTER — Other Ambulatory Visit: Payer: Self-pay

## 2019-03-19 VITALS — BP 138/61 | HR 71 | Ht 67.0 in | Wt 233.0 lb

## 2019-03-19 DIAGNOSIS — R0602 Shortness of breath: Secondary | ICD-10-CM | POA: Diagnosis not present

## 2019-03-19 DIAGNOSIS — I110 Hypertensive heart disease with heart failure: Secondary | ICD-10-CM | POA: Diagnosis not present

## 2019-03-19 DIAGNOSIS — I10 Essential (primary) hypertension: Secondary | ICD-10-CM

## 2019-03-19 DIAGNOSIS — I5033 Acute on chronic diastolic (congestive) heart failure: Secondary | ICD-10-CM

## 2019-03-19 DIAGNOSIS — I472 Ventricular tachycardia, unspecified: Secondary | ICD-10-CM

## 2019-03-19 MED ORDER — FUROSEMIDE 40 MG PO TABS: 40.0000 mg | ORAL_TABLET | Freq: Every day | ORAL | 4 refills | Status: DC

## 2019-03-19 NOTE — Telephone Encounter (Signed)
Called patient to go for After Visit Summary. He verbalized understanding of instructions as listed on the AVS. 1 month follow up scheduled.  Message sent to [facility], Dr Olin Pia nurse, to see if he would want to do consult as virtual or wait until in office visits are going again.

## 2019-03-19 NOTE — Patient Instructions (Addendum)
Medication Instructions:  Your physician has recommended you make the following change in your medication:  Take FUROSEMIDE 40 mg by mouth once a day until your swelling has completely resolved and breathing is back to your baseline. THEN you may take furosemide 40 mg daily AS NEEDED for swelling or weight gain greater than 2 pounds in 24 hours or 5 pounds in 1 week.   If you need a refill on your cardiac medications before your next appointment, please call your pharmacy.   Lab work: Your physician recommends that you return for lab work in: this week at the [place]  (CMP, TSH, MG). Please go to the [facility]. You will check in at the front desk to the right as you walk into the atrium. Valet Parking is offered if needed.   If you have labs (blood work) drawn today and your tests are completely normal, you will receive your results only by: Marland Kitchen MyChart Message (if you have MyChart) OR . A paper copy in the mail If you have any lab test that is abnormal or we need to change your treatment, we will call you to review the results.   Testing/Procedures: none   Follow-Up: At [facility], you and your health needs are our priority.  As part of our continuing mission to provide you with exceptional heart care, we have created designated Provider Care Teams.  These Care Teams include your primary Cardiologist (physician) and Advanced Practice Providers (APPs -  Physician Assistants and Nurse Practitioners) who all work together to provide you with the care you need, when you need it. You will need a follow up appointment in 1 months.   You may see Nelva Bush, MD.    Dennis Bast have been referred to DR Virl Axe (CAN BE WHENEVER COVID PRECAUTIONS ARE RELEASED).

## 2019-03-20 ENCOUNTER — Telehealth: Payer: Self-pay | Admitting: *Deleted

## 2019-03-20 ENCOUNTER — Other Ambulatory Visit
Admission: RE | Admit: 2019-03-20 | Discharge: 2019-03-20 | Disposition: A | Discharge status: Home / Self Care | Payer: PPO | Source: Ambulatory Visit | Attending: Internal Medicine | Admitting: Internal Medicine

## 2019-03-20 DIAGNOSIS — I5033 Acute on chronic diastolic (congestive) heart failure: Secondary | ICD-10-CM

## 2019-03-20 DIAGNOSIS — I081 Rheumatic disorders of both mitral and tricuspid valves: Secondary | ICD-10-CM | POA: Diagnosis not present

## 2019-03-20 DIAGNOSIS — Z1159 Encounter for screening for other viral diseases: Secondary | ICD-10-CM | POA: Diagnosis not present

## 2019-03-20 DIAGNOSIS — I272 Pulmonary hypertension, unspecified: Secondary | ICD-10-CM | POA: Diagnosis not present

## 2019-03-20 DIAGNOSIS — I472 Ventricular tachycardia, unspecified: Secondary | ICD-10-CM

## 2019-03-20 DIAGNOSIS — Z79899 Other long term (current) drug therapy: Secondary | ICD-10-CM

## 2019-03-20 LAB — COMPREHENSIVE METABOLIC PANEL
ALT: 28 U/L (ref 0–44)
AST: 26 U/L (ref 15–41)
Albumin: 4.1 g/dL (ref 3.5–5.0)
Alkaline Phosphatase: 53 U/L (ref 38–126)
Anion gap: 11 (ref 5–15)
BUN: 38 mg/dL — ABNORMAL HIGH (ref 8–23)
CO2: 36 mmol/L — ABNORMAL HIGH (ref 22–32)
Calcium: 9 mg/dL (ref 8.9–10.3)
Chloride: 94 mmol/L — ABNORMAL LOW (ref 98–111)
Creatinine, Ser: 1.39 mg/dL — ABNORMAL HIGH (ref 0.61–1.24)
GFR calc Af Amer: 58 mL/min — ABNORMAL LOW (ref >60)
GFR calc non Af Amer: 50 mL/min — ABNORMAL LOW (ref >60)
Glucose, Bld: 149 mg/dL — ABNORMAL HIGH (ref 70–99)
Potassium: 3.2 mmol/L — ABNORMAL LOW (ref 3.5–5.1)
Sodium: 141 mmol/L (ref 135–145)
Total Bilirubin: 0.6 mg/dL (ref 0.3–1.2)
Total Protein: 7.4 g/dL (ref 6.5–8.1)

## 2019-03-20 LAB — TSH: TSH: 1.014 u[IU]/mL (ref 0.350–4.500)

## 2019-03-20 LAB — MAGNESIUM: Magnesium: 2.3 mg/dL (ref 1.7–2.4)

## 2019-03-20 MED ORDER — POTASSIUM CHLORIDE CRYS ER 20 MEQ PO TBCR: 20.0000 meq | EXTENDED_RELEASE_TABLET | Freq: Every day | ORAL | 3 refills | Status: DC

## 2019-03-20 NOTE — Telephone Encounter (Signed)
Results called to pt. Pt verbalized understanding or results, recommendations and to: 1- stop HCTZ. 2- not take furosemide today and start it tomorrow at 40 mg daily. 3- start potassium 20 mEq once a day. 4- go to [facility] in 2 weeks for repeat BMP.  He was appreciative and did not have any further questions.

## 2019-03-20 NOTE — Telephone Encounter (Signed)
-----   Message from Nelva Bush, MD sent at 03/20/2019 10:13 AM EDT ----- Please let Joseph Macdonald know that it looks like his kidneys may be a bit dehydrated, given recent adjustment in his diuretics.  I recommend that he stop taking HCTZ and also hold furosemide today (or tomorrow if he has already taken today's dose).  He should restart furosemide 40 mg daily thereafter as well as start potassium chloride 20 mEq daily.  I encourage him to drink plenty of water (but minimize his salt intake).  We should repeat a BMP in ~2 weeks.

## 2019-04-04 ENCOUNTER — Other Ambulatory Visit: Payer: Self-pay | Admitting: Family Medicine

## 2019-04-04 DIAGNOSIS — M9979 Connective tissue and disc stenosis of intervertebral foramina of abdomen and other regions: Secondary | ICD-10-CM

## 2019-04-04 MED ORDER — HYDROCODONE-ACETAMINOPHEN 5-325 MG PO TABS: 1.0000 | ORAL_TABLET | Freq: Four times a day (QID) | ORAL | 0 refills | Status: DC | PRN

## 2019-04-08 ENCOUNTER — Encounter: Payer: Self-pay | Admitting: Internal Medicine

## 2019-04-08 NOTE — Progress Notes (Signed)
Virtual Visit via Video Note   This visit type was conducted due to national recommendations for restrictions regarding the COVID-19 Pandemic (e.g. social distancing) in an effort to limit this patient's exposure and mitigate transmission in our community.  Due to his co-morbid illnesses, this patient is at least at moderate risk for complications without adequate follow up.  This format is felt to be most appropriate for this patient at this time.  All issues noted in this document were discussed and addressed.  A limited physical exam was performed with this format.  Please refer to the patient's chart for his consent to telehealth for [facility].   Evaluation Performed:  Follow-up visit  Date:  04/11/2019   ID:  [name], DOB 05/18/1946, MRN [id number]  Patient Location: Home Provider Location: Office  PCP:  [name], MD  Cardiologist:  Nelva Bush, MD  Electrophysiologist:  None   Chief Complaint: Abdominal pain  History of Present Illness:    Joseph Macdonald is a 73 y.o. male with recently diagnosed ventricular tachycardia (no significant CAD or myocardial scarring/cardiomyopathy by [facility] and cardiac MRI), hypertension, and myasthenia gravis.  We last spoke on 03/19/2019 to follow-up his dyspnea and leg edema.  He reported that he was feeling much better after escalation of furosemide and decreasing the dose of amlodipine.  Home blood pressures were also well controlled.  We did not make any medication changes at that time but referred [name] to EP for ongoing management of his sustained ventricular tachycardia.  Today, [name] reports that he has been dealing with epigastric pain for the last 1 to 2 weeks.  Discomfort began shortly after he was started on potassium chloride for hypokalemia noted on BMP from 03/20/2019.  He typically takes his potassium chloride pill in the mornings and subsequently has sharp upper abdominal pain that lasts much of the day.   At times, he feels like the pill gets stuck in his esophagus despite breaking it in half.  He has noted some epigastric tenderness as well as nausea.  This morning, his abdomen feels fine.  His bowel movements have been normal.  He denies melena and hematochezia.  He has not had any fevers or chills either.  Lower extremity swelling has been relatively well controlled with as needed furosemide.  He has not experienced any chest pain, palpitations, lightheadedness, or orthopnea since we last spoke.  Chronic exertional dyspnea is unchanged.  His weight has also been stable around 220-222 pounds.  The patient does not have symptoms concerning for COVID-19 infection (fever, chills, cough, or new shortness of breath).    Past Medical History:  Diagnosis Date   Hyperlipidemia    Hypertension    Myasthenia gravis ([place])    Ventricular tachycardia ([place])    Past Surgical History:  Procedure Laterality Date   carpel tunnel sx     CATARACT EXTRACTION     right eye   SKIN GRAFT     Behind left knee   TONSILLECTOMY AND ADENOIDECTOMY       Current Meds  Medication Sig   albuterol (PROVENTIL HFA;VENTOLIN HFA) 108 (90 Base) MCG/ACT inhaler Inhale into the lungs every 6 (six) hours as needed for wheezing or shortness of breath.   amiodarone (PACERONE) 400 MG tablet Take 400 mg by mouth daily.   amLODipine (NORVASC) 5 MG tablet Take 1 tablet (5 mg total) by mouth daily.   aspirin EC 81 MG tablet Take 81 mg by mouth  daily.   carboxymethylcellulose 1 % ophthalmic solution 1 drop as needed.   famotidine (PEPCID) 20 MG tablet Take 20 mg by mouth as needed for heartburn or indigestion.   furosemide (LASIX) 40 MG tablet Take 1 tablet (40 mg total) by mouth daily. Until swelling/breathing return to normal. Then take as needed for swelling or weight gain.   gabapentin (NEURONTIN) 300 MG capsule Take 600 mg by mouth at bedtime.   HYDROcodone-acetaminophen (NORCO/VICODIN) 5-325 MG tablet Take  1 tablet by mouth every 6 (six) hours as needed for moderate pain.   mycophenolate (CELLCEPT) 500 MG tablet Take 500 mg by mouth 2 (two) times daily.   omeprazole (PRILOSEC) 40 MG capsule Take 40 mg by mouth daily.   potassium chloride SA (K-DUR,KLOR-CON) 20 MEQ tablet Take 1 tablet (20 mEq total) by mouth daily.   pramipexole (MIRAPEX) 0.5 MG tablet Take 1 tablet (0.5 mg total) by mouth at bedtime.   pravastatin (PRAVACHOL) 20 MG tablet Take 20 mg by mouth daily.   predniSONE (DELTASONE) 20 MG tablet Take 50 mg by mouth every other day.    pyridostigmine (MESTINON) 60 MG tablet Take 60 mg by mouth 3 (three) times daily.    tiotropium (SPIRIVA) 18 MCG inhalation capsule Place 18 mcg into inhaler and inhale daily.   tolnaftate (TINACTIN) 1 % cream Apply 1 application topically 2 (two) times daily as needed.    vitamin B-12 (CYANOCOBALAMIN) 1000 MCG tablet Take 1,000 mcg by mouth daily.     Allergies:   Patient has no known allergies.   Social History   Tobacco Use   Smoking status: Former Smoker    Packs/day: 0.50    Years: 40.00    Pack years: 20.00    Types: Pipe, Cigarettes    Last attempt to quit: 08/19/2017    Years since quitting: 1.6   Smokeless tobacco: Former Systems developer   Tobacco comment: previously smoked 1 1/2 pack since his 73 years old  Substance Use Topics   Alcohol use: Yes    Comment: occasionally   Drug use: No     Family Hx: The patient's family history includes Arthritis in his father, mother, and another family member; Heart attack in his father; Heart disease (age of onset: 13) in his father; Hypertension in his mother; Kidney disease in his mother; Stroke in his father; Thyroid disease in his mother.  ROS:   Please see the history of present illness.   All other systems reviewed and are negative.   Prior CV studies:   The following studies were reviewed today:  Cath/PCI:  LHC (01/15/2019,[facility]): LMCA normal.  30% mid LAD stenosis.  80%  septal branch lesion.  No significant disease involving the LCx or RCA.  LVEDP 20 mmHg.  EP Procedures and Devices:  None available. Telemetry/Holter monitor reports suggest ventricular tachycardia, some of which may be sustained.  Non-Invasive Evaluation(s):  Cardiac MRI (01/2019, [facility]):Reportedly normal LVEF.  TTE (01/14/2019, [place]): LVEF 55% with normal wall motion.  There is moderate asymmetric LV H with grade 1 diastolic dysfunction.  RV mildly dilated with normal systolic function.  Labs/Other Tests and Data Reviewed:    EKG:  No ECG reviewed.  Recent Labs: 03/20/2019: ALT 28; BUN 38; Creatinine, Ser 1.39; Magnesium 2.3; Potassium 3.2; Sodium 141; TSH 1.014   Recent Lipid Panel Lab Results  Component Value Date/Time   CHOL 171 12/21/2015   HDL 89 (A) 12/21/2015    Wt Readings from Last 3 Encounters:  04/11/19 222 lb (100.7 kg)  03/19/19 233 lb (105.7 kg)  03/12/19 233 lb 8 oz (105.9 kg)     Objective:    Vital Signs:  BP (!) 114/55 (BP Location: Left Arm, Patient Position: Sitting, Cuff Size: Normal)    Pulse 76    Ht 5\' 7"  (1.702 m)    Wt 222 lb (100.7 kg)    BMI 34.77 kg/m    VITAL SIGNS:  reviewed GEN:  no acute distress RESPIRATORY:  normal respiratory effort, symmetric expansion  ASSESSMENT & PLAN:    Chronic HFpEF: Overall, Mr. Farney seems stable with intermittent edema that is well controlled with as needed furosemide.  His weight has been holding stable and is down 11 pounds over the last month.  We will continue his current medications.  Sustained ventricular tachycardia: No palpitations or lightheadedness.  Electrophysiology consultation pending.  We will plan to continue with amiodarone 400 mg daily while awaiting further input from EP.  Shortness of breath: Chronic and stable.  This is likely multifactorial, including HFpEF and chronic lung disease.  No medication changes at this time.  Hypertension: Blood pressure is borderline  low today l.  As [name] is asymptomatic, I will not make any medication changes today.  Abdominal pain: Predominately epigastric and temporally related to addition of potassium chloride.  I wonder if he is having pill esophagitis or gastritis.  Though he was hypokalemic earlier this month, we have agreed to discontinue potassium supplementation at this time.  I advised him to contact his PCP or seek immediate medical attention should his abdominal pain worsen or he develop other concerning features such as fevers, melena/hematochezia, or refractory vomiting.  He should continue previously prescribed omeprazole and famotidine.  Hypokalemia: Noted earlier this month, most likely due to diuretic use.  Unfortunately, Mr. Dimple Nanas has been intolerant of potassium chloride, as detailed above.  We will discontinue this medication and plan to repeat a basic metabolic panel in 2 weeks.  I have encouraged Mr. Schoch to increase his dietary potassium intake.  COVID-19 Education: The signs and symptoms of COVID-19 were discussed with the patient and how to seek care for testing (follow up with PCP or arrange E-visit).  The importance of social distancing was discussed today.  Time:   Today, I have spent 12 minutes with the patient with telehealth technology discussing the above problems.  An additional 10 minutes were spent reviewing the patient's chart and documenting today's encounter.   Medication Adjustments/Labs and Tests Ordered: Current medicines are reviewed at length with the patient today.  Concerns regarding medicines are outlined above.   Tests Ordered: No orders of the defined types were placed in this encounter.   Medication Changes: No orders of the defined types were placed in this encounter.   Disposition:  Follow up in 3 month(s)  Signed, Nelva Bush, MD  04/11/2019 8:53 AM    [facility]

## 2019-04-10 DIAGNOSIS — B354 Tinea corporis: Secondary | ICD-10-CM | POA: Diagnosis not present

## 2019-04-11 ENCOUNTER — Encounter: Payer: Self-pay | Admitting: Internal Medicine

## 2019-04-11 ENCOUNTER — Telehealth (INDEPENDENT_AMBULATORY_CARE_PROVIDER_SITE_OTHER): Payer: PPO | Admitting: Internal Medicine

## 2019-04-11 ENCOUNTER — Other Ambulatory Visit: Payer: Self-pay

## 2019-04-11 ENCOUNTER — Telehealth: Payer: Self-pay | Admitting: *Deleted

## 2019-04-11 VITALS — BP 114/55 | HR 76 | Ht 67.0 in | Wt 222.0 lb

## 2019-04-11 DIAGNOSIS — I5032 Chronic diastolic (congestive) heart failure: Secondary | ICD-10-CM | POA: Diagnosis not present

## 2019-04-11 DIAGNOSIS — E876 Hypokalemia: Secondary | ICD-10-CM | POA: Insufficient documentation

## 2019-04-11 DIAGNOSIS — I472 Ventricular tachycardia, unspecified: Secondary | ICD-10-CM

## 2019-04-11 DIAGNOSIS — I110 Hypertensive heart disease with heart failure: Secondary | ICD-10-CM | POA: Diagnosis not present

## 2019-04-11 DIAGNOSIS — I10 Essential (primary) hypertension: Secondary | ICD-10-CM

## 2019-04-11 DIAGNOSIS — R0602 Shortness of breath: Secondary | ICD-10-CM | POA: Insufficient documentation

## 2019-04-11 DIAGNOSIS — R1013 Epigastric pain: Secondary | ICD-10-CM | POA: Insufficient documentation

## 2019-04-11 NOTE — Telephone Encounter (Signed)
Ok. Thanks!

## 2019-04-11 NOTE — Patient Instructions (Signed)
Medication Instructions:  Your physician has recommended you make the following change in your medication:  1- STOP taking Potassium Chloride.   If you need a refill on your cardiac medications before your next appointment, please call your pharmacy.   Lab work: Your physician recommends that you return for lab work in: [place] 15, 2020.  (BMET) - Please go to the [facility]. You will check in at the front desk to the right as you walk into the atrium. Valet Parking is offered if needed.    If you have labs (blood work) drawn today and your tests are completely normal, you will receive your results only by: Marland Kitchen MyChart Message (if you have MyChart) OR . A paper copy in the mail If you have any lab test that is abnormal or we need to change your treatment, we will call you to review the results.  Testing/Procedures: none  Follow-Up: At [facility], you and your health needs are our priority.  As part of our continuing mission to provide you with exceptional heart care, we have created designated Provider Care Teams.  These Care Teams include your primary Cardiologist (physician) and Advanced Practice Providers (APPs -  Physician Assistants and Nurse Practitioners) who all work together to provide you with the care you need, when you need it. You will need a follow up appointment in 3 months.  Please call our office 2 months in advance to schedule this appointment.  You may see Nelva Bush, MD or one of the following Advanced Practice Providers on your designated Care Team:   Murray Hodgkins, NP Christell Faith, PA-C . Marrianne Mood, PA-C

## 2019-04-11 NOTE — Telephone Encounter (Signed)
Called patient to go over AVS from virtual visit today. He verbalized understanding of the instructions.  However, he will be out of town from May 7th to 23rd. [facility] the lab work when he's returned from his trip. Routing to Dr End to make him aware.

## 2019-04-13 ENCOUNTER — Encounter: Payer: Self-pay | Admitting: Family Medicine

## 2019-04-14 ENCOUNTER — Other Ambulatory Visit: Payer: Self-pay

## 2019-04-14 ENCOUNTER — Ambulatory Visit: Payer: PPO | Admitting: Family Medicine

## 2019-04-14 ENCOUNTER — Encounter: Payer: Self-pay | Admitting: Family Medicine

## 2019-04-14 ENCOUNTER — Ambulatory Visit (INDEPENDENT_AMBULATORY_CARE_PROVIDER_SITE_OTHER): Payer: PPO | Admitting: Family Medicine

## 2019-04-14 VITALS — BP 122/64 | HR 72 | Temp 99.9°F | Wt 231.0 lb

## 2019-04-14 DIAGNOSIS — R110 Nausea: Secondary | ICD-10-CM

## 2019-04-14 DIAGNOSIS — E876 Hypokalemia: Secondary | ICD-10-CM | POA: Diagnosis not present

## 2019-04-14 DIAGNOSIS — I472 Ventricular tachycardia, unspecified: Secondary | ICD-10-CM

## 2019-04-14 DIAGNOSIS — I5032 Chronic diastolic (congestive) heart failure: Secondary | ICD-10-CM | POA: Diagnosis not present

## 2019-04-14 DIAGNOSIS — G7000 Myasthenia gravis without (acute) exacerbation: Secondary | ICD-10-CM
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
## 2019-04-15 LAB — COMPREHENSIVE METABOLIC PANEL
ALT: 31 IU/L (ref 0–44)
AST: 31 IU/L (ref 0–40)
Albumin/Globulin Ratio: 2.1 (ref 1.2–2.2)
Albumin: 4.2 g/dL (ref 3.7–4.7)
Alkaline Phosphatase: 39 IU/L (ref 39–117)
BUN/Creatinine Ratio: 18 (ref 10–24)
BUN: 21 mg/dL (ref 8–27)
Bilirubin Total: 0.6 mg/dL (ref 0.0–1.2)
CO2: 28 mmol/L (ref 20–29)
Calcium: 8.7 mg/dL (ref 8.6–10.2)
Chloride: 93 mmol/L — ABNORMAL LOW (ref 96–106)
Creatinine, Ser: 1.18 mg/dL (ref 0.76–1.27)
GFR calc Af Amer: 71 mL/min/{1.73_m2} (ref >59)
GFR calc non Af Amer: 61 mL/min/{1.73_m2} (ref >59)
Globulin, Total: 2 g/dL (ref 1.5–4.5)
Glucose: 159 mg/dL — ABNORMAL HIGH (ref 65–99)
Potassium: 3.7 mmol/L (ref 3.5–5.2)
Sodium: 138 mmol/L (ref 134–144)
Total Protein: 6.2 g/dL (ref 6.0–8.5)

## 2019-04-15 LAB — CBC WITH DIFFERENTIAL/PLATELET
Basophils Absolute: 0 10*3/uL (ref 0.0–0.2)
Basos: 0 %
EOS (ABSOLUTE): 0 10*3/uL (ref 0.0–0.4)
Eos: 0 %
Hematocrit: 45.3 % (ref 37.5–51.0)
Hemoglobin: 15.5 g/dL (ref 13.0–17.7)
Immature Grans (Abs): 0 10*3/uL (ref 0.0–0.1)
Immature Granulocytes: 0 %
Lymphocytes Absolute: 0.6 10*3/uL — ABNORMAL LOW (ref 0.7–3.1)
Lymphs: 5 %
MCH: 30.9 pg (ref 26.6–33.0)
MCHC: 34.2 g/dL (ref 31.5–35.7)
MCV: 90 fL (ref 79–97)
Monocytes Absolute: 0.3 10*3/uL (ref 0.1–0.9)
Monocytes: 3 %
Neutrophils Absolute: 11.2 10*3/uL — ABNORMAL HIGH (ref 1.4–7.0)
Neutrophils: 92 %
Platelets: 169 10*3/uL (ref 150–450)
RBC: 5.02 x10E6/uL (ref 4.14–5.80)
RDW: 13.2 % (ref 11.6–15.4)
WBC: 12.1 10*3/uL — ABNORMAL HIGH (ref 3.4–10.8)

## 2019-04-15 LAB — TSH: TSH: 0.201 u[IU]/mL — ABNORMAL LOW (ref 0.450–4.500)

## 2019-05-01 ENCOUNTER — Telehealth: Payer: Self-pay

## 2019-05-01 NOTE — Telephone Encounter (Signed)
Fine with me.  Nelva Bush, MD [facility] Pager: [phone]

## 2019-05-01 NOTE — Telephone Encounter (Signed)
Okay to switch I take care of patient's wife, I think it sounded appealing to them to be both seen at the same time Happy to help

## 2019-05-01 NOTE — Telephone Encounter (Signed)
DPR on file  Mychart message sent by the patients wife through her mychart account. Patient's wife sts that during her telemedicine with Dr. Rockey Situ they discussed the patient switching providers from Dr. Saunders Revel to [place].  Message forwarded to both MDs and scheduling.

## 2019-05-02 NOTE — Telephone Encounter (Signed)
Hey Dr. Saunders Revel & Dr. Rockey Situ   This message is coming from [facility], but it is in regards to her husband Joseph Macdonald- DOB (04/27/2046). He is wanting to switch from [facility] to [facility]- felt like this may need to be addressed by Dr. Saunders Revel until he can establish with [name], but I'll let you guys make that call.    Thanks!

## 2019-05-02 NOTE — Telephone Encounter (Signed)
The patient's wife had been sending MyChart messages about her husband from her [place] encounter.   Moved this documentation to the patient's chart.

## 2019-05-02 NOTE — Telephone Encounter (Signed)
Answers:  1) one 40 mg tab per day  2) no, [place] and [place] not [place] a [place]. he hasn't felt like he was gaining weight  3) yes, he is very short of breath. not any more salty food than normal  4) we talked to Dr. Rockey Situ and he said it be not problem for him to take on Joe as a patient because that is who we wanted to begin with.   He is also staying nauseated and hurting across his upper abdomen. He can't eat without feeling like he has to throw up.   Sincerely,  Arul Farabee

## 2019-05-02 NOTE — Telephone Encounter (Signed)
Hey Dr. Saunders Revel, do you think he needs to be in office or virtual?

## 2019-05-02 NOTE — Telephone Encounter (Signed)
Hello Mrs. Fowle,   We did get an ok from Dr. Saunders Revel to bring Mr. Trauger into the office to be seen. Our first open slot at the present time is Wednesday 05/07/19 at 2:00 pm with Christell Faith, PA. I have gone ahead and scheduled him for this. Again we are closed Monday. Please feel free to call the office on Tuesday morning if you feel like he needs to be seen sooner and we can see if we have had any cancellations/ spots open on the providers schedule.   If you feel like Mr. Kading gets worse over the weekend, Dr. Saunders Revel recommended that he be seen in the ER/ Urgent Care.   Please know that when he comes to the office, he will have to enter through the [place] [facility] and go through a screening process.  He will need to wear a mask. It is preferred that he come alone, but if this is not possible, you will both need to wear a mask and be screened once coming into the [place].  Only he will be allowed to come back to the exam room.   Thank you!  Nira Conn, RN

## 2019-05-02 NOTE — Telephone Encounter (Signed)
I really think Joe needs to be seen, but we will have to do what we're told to do to get him help. So, whatever you have available will have to do. Just let us know.   Thanks,  Filimon Miranda

## 2019-05-02 NOTE — Telephone Encounter (Signed)
Dr. Rockey Situ,  I had a virtual visit with you on May 12th. During that visit, my husband asked you if you could take him as a patient. He has been seeing Dr. Saunders Revel and was pleased with him but had wanted you. He has been on lasik for swelling in his feet and ankles. With the initial dosage, he had good results in the beginning. However, in the last few days, it doesn't seem to be helping as much. [place] and his feet have been swollen for about 8 days. He also has been having stomach issues. We will be here until May 23rd. Is there anything else he can try? His left foot and ankle is so swollen it looks like a football.   My telephone number is [phone] and Dev[place]'s phone number is [phone].   Sincerely,  Joseph Macdonald

## 2019-05-02 NOTE — Telephone Encounter (Signed)
Hello Ms. Bentz,   We received a message back from Dr. Saunders Revel as stated below:   "[place] beach is prime real estate for swelling. I find that the sedentary lifestyle and sea food that are ubiquitous (even if patients report that they are not eating more salt) are often the culprits. I recommend that Mr. Pappalardo increase furosemide to 40 mg twice daily until his edema (swelling) has improved. I suggest that he be seen by Dr. Rockey Situ or an APP (PA/ NP) when he returns from the beach. If Dr. Rockey Situ has other thoughts, I will defer to him."   I was looking at the schedules for next week and Dr. Rockey Situ is on vacation.  We are still currently doing virtual visits in light of the COVID-19 pandemic.  If Mr. Caylor is agreeable, it does look like when have availability to set him up for an E-visit with our PA/ NP next week.   If you could let us know if he is ok with this, we can let you know the dates and times.  We are closed Monday due to the [facility].   Thank you!  Nira Conn, RN

## 2019-05-02 NOTE — Telephone Encounter (Signed)
[place] for swelling. I find that the sedentary lifestyle and sea food that are ubiquitous (even if patients report that they are not eating more salt) are often the culprits. I recommend that Joseph Macdonald increase furosemide to 40 mg BID until his edema has improved. I suggest that he be seen by Dr. Rockey Situ or an APP when he returns from the beach. If Dr. Rockey Situ has other thoughts, I will defer to him.   [name]

## 2019-05-02 NOTE — Telephone Encounter (Signed)
Please see if [name] or Thurmond Butts can see him sometime next week (I believe that Dr. Rockey Situ is off all week). If not, you can try adding onto my schedule, though I am not in the office until Wednesday. If symptoms worsen in the meantime, he may need to go to the ED or urgent care at the beach.   [name]

## 2019-05-02 NOTE — Telephone Encounter (Signed)
Hello Joseph Macdonald,    1) How much Lasix has Joseph Macdonald been taking daily?  2) Is he weighing daily? Has his weight gone up?  3) Is he having any sob? Has he been eating salty foods?  4)) Our protocol for switching providers are that both MDs are notified. I will send a message to both Dr. Rockey Situ and Dr. Saunders Revel with the request to switch providers from Dr. Saunders Revel to [place]   Look forward to your response   Lattie Haw, RN

## 2019-05-05 ENCOUNTER — Encounter: Payer: Self-pay | Admitting: Family Medicine

## 2019-05-06 ENCOUNTER — Other Ambulatory Visit: Payer: Self-pay | Admitting: Family Medicine

## 2019-05-06 ENCOUNTER — Telehealth: Payer: Self-pay

## 2019-05-06 DIAGNOSIS — M9979 Connective tissue and disc stenosis of intervertebral foramina of abdomen and other regions: Secondary | ICD-10-CM

## 2019-05-06 MED ORDER — HYDROCODONE-ACETAMINOPHEN 5-325 MG PO TABS: 1.0000 | ORAL_TABLET | Freq: Four times a day (QID) | ORAL | 0 refills | Status: DC | PRN

## 2019-05-06 NOTE — Telephone Encounter (Signed)
Attempted to contact patient for pre-screening, unable to leave a message.

## 2019-05-06 NOTE — Progress Notes (Signed)
Cardiology Office Note Date:  05/07/2019  Patient ID:  Joseph Macdonald, DOB 05-17-1946, MRN [id number] PCP:  [name], MD  Cardiologist:  Dr. Saunders Revel, MD    Chief Complaint: Progressive shortness of breath, lower extremity edema, abdominal distention  History of Present Illness: Joseph Macdonald is a 73 y.o. male with history of sustained VT, myasthenia gravis with myasthenia crisis requiring plasma exchange at [facility] in 12/2018, HFpEF, HTN, previous history of smoking, and HLD who presents for follow up of HFpEF.  Patient was previously followed by the [facility]. He presented to Dr. Saunders Revel in 03/2019 as a second opinion following an admission at the [facility] in 12/2018 for marked weakness consistent with myasthenia crisis. He underwent plasma exchange and was noted to have episodes of NSVT. He was referred for Holter monitoring, though when he presented for this he was found to be in asymptomatic WCT. He was readmitted to the hospital and started on amiodarone. Cardiac workup including echo, LHC, and cardiac MRI showed no evidence of cardiomyopathy or significant CAD. Upon establishing with Dr. Saunders Revel in 03/2019 he noted a several month to years history of chest discomfort. More recently, he noted an increase in exertional dyspnea and lower extremity swelling over the past several months as well as a migratory rash that has been suspected to be fungal in nature. At his visit in 03/2019 he reported a 15 pound weight gain over the prior 3-4 months. His amlodipine was decreased and he was started on Lasix 40 mg daily x 1 week (with reduction in HCTZ). He was referred to EP. In telehealth follow up on 03/19/2019 he was feeling better. He was most recently seen on 04/11/2019 noting a stable weight around 220-222 pounds (down 11 pounds over the prior month). He did report some epigastric discomfort that seemed to be associated with KCl.   My Chart message was sent on 5/21 noting, [place], the patient had been experiencing increased lower extremity swelling. He had been taking Lasix 40 mg daily and denied excessive salty food. He did not have a scale to weigh himself. He was advised to increase his Lasix to 40 mg bid and schedule an appointment.    Patient comes accompanied by his wife today. Since increasing his Lasix he noted an improvement in his lower extremity swelling, though still not back to baseline. There remains some confusion regarding his dosage at home as his wife has indicated he has been taking the above directed Lasix 40 mg bid, though the patient states he has been taking Lasix 20 mg bid. He continues to note abdominal distension with associated early satiety and now 2-pillow orthopnea (baseline 1-pillow). He reports a dry weight around 223-225 pounds dating back to 11/2018, though since his admission to [facility] in 12/2018 his weight has hovered around 232 pounds. There has also been some noted PND with the above. He has previously been told he may have sleep apnea though has significant anxiety with undergoing a sleep study or "having something on my face." He reports drinking < 2 L of fluids daily and watching his salt intake, though at times his wife adds salt to the food. No chest pain or palpitations. No dizziness, presyncope, or syncope. He notes if he drinks water too fast he will have a hard time keeping the fluid down. He is uncertain if this is related to his underlying myasthenia. Because of this, he remains off KCl at  this time. BP remains well controlled at home. Lastly, he did note right elbow swelling and bruising with a central scab earlier this week. The bruising has spread to the mid forearm with improvement in swelling over the elbow. He is uncertain of any trauma. No recent fevers or chills.   Labs: 04/2019 - TSH 0.201, SCr 1.18, K+ 3.7, AST/ALT normal, HGB 15.5    Past Medical History:  Diagnosis Date  . Hyperlipidemia   . Hypertension   .  Myasthenia gravis ([place])   . Ventricular tachycardia [facility])     Past Surgical History:  Procedure Laterality Date  . carpel tunnel sx    . CATARACT EXTRACTION     right eye  . SKIN GRAFT     Behind left knee  . TONSILLECTOMY AND ADENOIDECTOMY      Current Meds  Medication Sig  . albuterol (PROVENTIL HFA;VENTOLIN HFA) 108 (90 Base) MCG/ACT inhaler Inhale into the lungs every 6 (six) hours as needed for wheezing or shortness of breath.  Marland Kitchen amiodarone (PACERONE) 400 MG tablet Take 400 mg by mouth daily.  Marland Kitchen amLODipine (NORVASC) 5 MG tablet Take 1 tablet (5 mg total) by mouth daily.  Marland Kitchen aspirin EC 81 MG tablet Take 81 mg by mouth daily.  . carboxymethylcellulose 1 % ophthalmic solution 1 drop as needed.  . famotidine (PEPCID) 20 MG tablet Take 20 mg by mouth as needed for heartburn or indigestion.  . gabapentin (NEURONTIN) 300 MG capsule Take 600 mg by mouth at bedtime.  Marland Kitchen HYDROcodone-acetaminophen (NORCO/VICODIN) 5-325 MG tablet Take 1 tablet by mouth every 6 (six) hours as needed for moderate pain.  . mycophenolate (CELLCEPT) 500 MG tablet Take 500 mg by mouth 2 (two) times daily.  Marland Kitchen omeprazole (PRILOSEC) 40 MG capsule Take 40 mg by mouth daily.  . pramipexole (MIRAPEX) 0.5 MG tablet Take 1 tablet (0.5 mg total) by mouth at bedtime.  . pravastatin (PRAVACHOL) 20 MG tablet Take 20 mg by mouth daily.  . predniSONE (DELTASONE) 20 MG tablet Take 50 mg by mouth daily with breakfast.   . pyridostigmine (MESTINON) 60 MG tablet Take 60 mg by mouth 4 (four) times daily.   Marland Kitchen tiotropium (SPIRIVA) 18 MCG inhalation capsule Place 18 mcg into inhaler and inhale daily.  Marland Kitchen tolnaftate (TINACTIN) 1 % cream Apply 1 application topically 2 (two) times daily as needed.   . vitamin B-12 (CYANOCOBALAMIN) 1000 MCG tablet Take 1,000 mcg by mouth daily.  . [DISCONTINUED] furosemide (LASIX) 40 MG tablet Take 1 tablet (40 mg total) by mouth daily. Until swelling/breathing return to normal. Then take as needed for  swelling or weight gain.    Allergies:   Patient has no known allergies.   Social History:  The patient  reports that he quit smoking about 20 months ago. His smoking use included pipe and cigarettes. He has a 20.00 pack-year smoking history. He has quit using smokeless tobacco. He reports current alcohol use. He reports that he does not use drugs.   Family History:  The patient's family history includes Arthritis in his father, mother, and another family member; Heart attack in his father; Heart disease (age of onset: 84) in his father; Hypertension in his mother; Kidney disease in his mother; Stroke in his father; Thyroid disease in his mother.  ROS:   Review of Systems  Constitutional: Positive for malaise/fatigue. Negative for weight loss.  Eyes: Positive for pain and redness. Negative for blurred vision, double vision and discharge.  R eye erythema, initially improved with drops (per wife). Pain is improving in R eye.  Respiratory: Positive for shortness of breath. Negative for cough, hemoptysis and sputum production.   Cardiovascular: Positive for orthopnea, claudication and leg swelling. Negative for chest pain and palpitations.       Improved b/l 1+ LEE and erythema. 2 pillow orthopnea x4 weeks.   Gastrointestinal: Positive for abdominal pain. Negative for blood in stool, constipation, diarrhea, melena, nausea and vomiting.       Reduced appetite due to abdominal distention. Intermittent issues with swallowing water as "spits it back up." Feels pain within 2 bites of food, which has resulted in reduced appetite. Abdominal pain is bilateral epigastric pain that worsens with laying down and corresponds with abdomen swelling.   Genitourinary: Negative for dysuria, flank pain, frequency, hematuria and urgency.  Musculoskeletal: Negative for falls.  Neurological: Positive for dizziness and weakness. Negative for tingling, sensory change, speech change, focal weakness and loss of  consciousness.       Weakness felt in bilateral legs when ambulating. Pain in bilateral legs with ambulation. "Swimmy headed" 5/26 when ambulating to and from the trash can at his curb but improved with rest.  Endo/Heme/Allergies: Bruises/bleeds easily.  Psychiatric/Behavioral: Positive for memory loss. Negative for substance abuse.       Previous smoker. Wakes with SOB. Wife reported apnea at night, snoring. Self reported memory loss, ongoing.  All other systems reviewed and are negative.    PHYSICAL EXAM:  VS:  BP (!) 142/64 (BP Location: Left Arm, Patient Position: Sitting, Cuff Size: Normal)   Pulse 62   Ht 5\' 7"  (1.702 m)   Wt 231 lb 12.8 oz (105.1 kg)   BMI 36.31 kg/m  BMI: Body mass index is 36.31 kg/m.  Physical Exam  Constitutional: He is oriented to person, place, and time. He appears well-developed and well-nourished. No distress.  Obese  HENT:  Head: Normocephalic and atraumatic.  Eyes:  R eye erythema  Cardiovascular: Normal rate, regular rhythm and intact distal pulses. Exam reveals no gallop and no friction rub.  No murmur heard. Pulses:      Radial pulses are 2+ on the right side and 2+ on the left side.       Dorsalis pedis pulses are 1+ on the right side and 1+ on the left side.  Pulmonary/Chest: Effort normal. No respiratory distress. He has wheezes. He exhibits no tenderness.  R upper lobe wheezing  Abdominal: He exhibits distension. He exhibits no mass. There is no abdominal tenderness. There is no rebound and no guarding.  Not TTP on exam.   Musculoskeletal:        General: Tenderness and edema present.     Comments: Bilateral trace to 1+ LEE with associated erythema. Worse in L leg than R leg, which is chronic with known L leg injury during [place].  Neurological: He is alert and oriented to person, place, and time.  Skin: Skin is warm and dry. Rash noted. There is erythema.  Mild improving bilateral erythema. R posterior arm / elbow erythema /  ecchymosis covering a good portion of upper and lower arm and with central 98mm scab, R elbow effusion, not TTP   Psychiatric: He has a normal mood and affect. His behavior is normal. Judgment and thought content normal.     EKG:  Was ordered and interpreted by me today. Shows NSR at 62bpm with IVCD (QRS 106) / LAFB, LAD, LVH with avL over 11  Recent Labs:  03/20/2019: Magnesium 2.3 04/14/2019: ALT 31; BUN 21; Creatinine, Ser 1.18; Hemoglobin 15.5; Platelets 169; Potassium 3.7; Sodium 138; TSH 0.201  No results found for requested labs within last 8760 hours.   CrCl cannot be calculated (Patient's most recent lab result is older than the maximum 21 days allowed.).   Wt Readings from Last 3 Encounters:  05/07/19 231 lb 12.8 oz (105.1 kg)  04/14/19 231 lb (104.8 kg)  04/11/19 222 lb (100.7 kg)     Other studies reviewed: Additional studies/records reviewed today include: summarized above  Cath/PCI:  LHC (01/15/2019,[facility]):LMCA normal. 30% mid LAD stenosis. 80% septal branch lesion. No significant disease involving the LCx or RCA. LVEDP 20 mmHg.  EP Procedures and Devices:  None available. Telemetry/Holter monitor reports suggest ventricular tachycardia, some of which may be sustained.  Non-Invasive Evaluation(s):  Cardiac MRI (01/2019[facility]):Reportedly normal LVEF.  TTE (01/14/2019,[facility]): LVEF 55% with normal wall motion. There is moderate asymmetric LV H with grade 1 diastolic dysfunction. RV mildly dilated with normal systolic function.  ASSESSMENT AND PLAN:  Acute on chronic HFpEF -Sx include progressive shortness of breath/DOE, b/l lower extremity edema/erythema, abdominal distention / epigastric pain, two-pillow orthopnea, and early satiety.  Reported improvement with increased dose of Lasix 40 mg daily to twice daily (details of dosing are unclear secondary to patient and wife confusion); however, he remains volume overloaded, particularly with  abdominal distension.  - Recent TTE (01/14/2019, [place]) documented on review of 04/11/2019 telephone visit with primary cardiologist.  LVEF 55% with motion abnormalities.  Moderate LVH, G1 DD.  Mild RV dilation.  01/2019 Cardiac MRI without evidence of cardiomyopathy. LHC without significant CAD.  - Case discussed with Dr. Saunders Revel.  - Could transition from Lasix to torsemide given abdominal swelling vs increasing Lasix. We have agreed with proceed with transitioning to torsemide 40 mg daily x 3 days followed by 20 mg daily thereafter.  - Some of his lower extremity swelling may be exacerbated by amlodipine, which could be discontinued in follow up.    - Follow-up BMET within a week given change in diuresis as well as recent hypokalemia (has appointment with EP next week). KCl supplementation has been held d/t suspicion of gastritis. 04/2019 Scr 1.18, BUN 21, K 3.7. If updated BMET shows continued or worsening hypokalemia, we will need to add liquid potassium repletion.  - Recent CBC demonstrated normal HGB. - CHF education including dietary changes were discussed in detail.  - He would benefit from sleep study, which has previously been recommended, though deferred on the patient's part. This will need to be revisited in follow up.  - Attempt to obtain records from [facility] admission in 12/2018 as they are not in [place].   H/o sustained VT - No recent feelings of racing heart rate or palpitations.  - Extensive work up at [facility] unrevealing as above. Attempt to obtain records.  - Continue Amiodarone until seen by EP.   -Further recommendations per EP with scheduled appointment next week.  - Recent TSH suppressed and needs follow up per PCP.  - Potassium improved.  - Recent magnesium at goal.  - Heart rates in the 60s bpm preclude addition of beta blocker at this time (if able, would use beta blocker over amlodipine given lower extremity swelling).   Nonobstructive CAD  -Recent LHC reported to be  nonobstructive (unable to review). - No symptoms suggestive of angina at this time. - Continue current medication including ASA and statin.  - Aggressive risk  factor modification and primary prevention.   Epigastric pain/dysphagia - Cannot exclude possible component of myasthenia vs esophageal spasm. - If he drinks slowly he is asymptomatic.  - Follow up with PCP.   Hypertension - SBP elevated mildly elevated today, though typically well controlled.  - Continue to monitor.  - Diurese as above.  - Remains on amlodipine.   AKI - Improved on recent labs. - Follow up labs 1 week.   Right arm ecchymosis - Etiology uncertain. - He indicates this is improving.  - Advised patient to mark border of bruise and contact his PCP.  - Cannot exclude fomite bite.    Disposition: F/u with Dr. Rockey Situ in 1 month (patient is transitioning from Dr. Saunders Revel to Dr. Rockey Situ). Next visit must be with Dr. Rockey Situ to establish care with him. Patient noted to have right eye erythema, advised to call PCP.  Current medicines are reviewed at length with the patient today.  The patient did not have any concerns regarding medicines.  Assessment and plan formulated by Marrianne Mood, PA-C with precepting by myself. I have evaluated/examined the patient. I have made changes to the above HPI/ROS/exam/A&P as needed.   Signed, Christell Faith, PA-C 05/07/2019 6:12 PM     [place] [address] [place], [place] [address] [phone]

## 2019-05-06 NOTE — Telephone Encounter (Signed)
COVID-19 Pre-Screening Questions:   ?   In the past 7 to 10 days have you had a cough, shortness of breath, headache, congestion, fever (100 or greater) body aches, chills, sore throat, or sudden loss of taste or sense of smell?  No  Have you been around anyone with known Covid 19. NO  Have you been around anyone who is awaiting Covid 19 test results in the past 7 to 10 days? NO  Have you been around anyone who has been exposed to Covid 19, or has mentioned symptoms of Covid 19 within the past 7 to 10 days? No  If you have any concerns/questions about symptoms patients report during screening (either on the phone or at threshold). Contact the provider seeing the patient or DOD for further guidance. If neither are available contact a member of the leadership team."      

## 2019-05-07 ENCOUNTER — Encounter: Payer: Self-pay | Admitting: Physician Assistant

## 2019-05-07 ENCOUNTER — Other Ambulatory Visit: Payer: Self-pay

## 2019-05-07 ENCOUNTER — Ambulatory Visit (INDEPENDENT_AMBULATORY_CARE_PROVIDER_SITE_OTHER): Payer: PPO | Admitting: Physician Assistant

## 2019-05-07 VITALS — BP 142/64 | HR 62 | Ht 67.0 in | Wt 231.8 lb

## 2019-05-07 DIAGNOSIS — I10 Essential (primary) hypertension: Secondary | ICD-10-CM | POA: Diagnosis not present

## 2019-05-07 DIAGNOSIS — R1310 Dysphagia, unspecified: Secondary | ICD-10-CM

## 2019-05-07 DIAGNOSIS — T148XXA Other injury of unspecified body region, initial encounter: Secondary | ICD-10-CM | POA: Diagnosis not present

## 2019-05-07 DIAGNOSIS — I2510 Atherosclerotic heart disease of native coronary artery without angina pectoris: Secondary | ICD-10-CM

## 2019-05-07 DIAGNOSIS — I472 Ventricular tachycardia, unspecified: Secondary | ICD-10-CM

## 2019-05-07 DIAGNOSIS — R1013 Epigastric pain: Secondary | ICD-10-CM

## 2019-05-07 DIAGNOSIS — I5033 Acute on chronic diastolic (congestive) heart failure: Secondary | ICD-10-CM | POA: Diagnosis not present

## 2019-05-07 DIAGNOSIS — N179 Acute kidney failure, unspecified: Secondary | ICD-10-CM | POA: Diagnosis not present

## 2019-05-07 MED ORDER — TORSEMIDE 20 MG PO TABS: ORAL_TABLET | ORAL | 3 refills | Status: DC

## 2019-05-07 NOTE — Patient Instructions (Signed)
Medication Instructions:  Your physician has recommended you make the following change in your medication:  1- STOP Lasix  2- START Torsemide Take 2 tablets (40 mg total) once daily for 3 days, then take 1 tablet (20 mg total) once daily thereafter. If you need a refill on your cardiac medications before your next appointment, please call your pharmacy.   Lab work: Please have labs at your next office visit.    If you have labs (blood work) drawn today and your tests are completely normal, you will receive your results only by: Marland Kitchen MyChart Message (if you have MyChart) OR . A paper copy in the mail If you have any lab test that is abnormal or we need to change your treatment, we will call you to review the results.  Testing/Procedures: None ordered   Follow-Up: At [facility], you and your health needs are our priority.  As part of our continuing mission to provide you with exceptional heart care, we have created designated Provider Care Teams.  These Care Teams include your primary Cardiologist (physician) and Advanced Practice Providers (APPs -  Physician Assistants and Nurse Practitioners) who all work together to provide you with the care you need, when you need it. You will need a follow up appointment in 2 months.  Please see Dr. Rockey Situ (virtual or in office) to establish care.

## 2019-05-12 NOTE — Progress Notes (Signed)
ELECTROPHYSIOLOGY CONSULT NOTE  Patient ID: Joseph Macdonald, MRN: [id number], DOB/AGE: 1946-08-25 73 y.o. Admit date: (Not on file) Date of Consult: 05/13/2019    PCP:  [name], MD  Cardiologist:  Nelva Bush, MD   Electrophysiologist:  None   Chief Complaint:   Ventricular tachycardia *  History of Present Illness:    Joseph Macdonald is a 73 y.o. male who presents for an office evaluation for ventricular tachycardia.       Presented to [facility] 1/20 for plasma exchange because of an aggravation of myasthenia gravis.  The records are not available, this may be because his name is different in the [place] system, but what he describes are short runs of tachycardia that lasted 5 seconds or so coming repeatedly while he was in the hospital.  He does not remember anything more than 10 seconds they were associated with some palpitations.  He is also had some orthostatic lightheadedness it was his impression that that some of these episodes were triggered by standing although they frequently had no specific associations.  While there he underwent the below mentioned cardiac evaluation.  He was started on amiodarone   DATE TEST EF   1/20 Echo   55-60 %   1/20 LHC  * % Cors  w/o obstruc  1/20 cMRI  ?Asymmetric Septal Hypertrophy   Date Cr K Hgb TSH LFTs  4/20 1.39 4.2   1.19 28  5/20 1.18 3.7 15.5 0.2 31   Since that time he has struggled with progressive dyspnea.  He has had asymmetric edema (left knee injury-[place]) and notes that he had an evaluation for lower extremity clots.  When seen by Dr. Bethann Humble, diuretics were adjusted and he improved somewhat.  He has had recurrence of edema and abdominal swelling.  He is copious in his fluid intake.  Shortness of breath by 25 to 50 feet.  Denies nocturnal dyspnea.  Has untreated sleep apnea secondary to claustrophobia.  He still is having some problems with myasthenia as manifested by diplopia and difficulty swallowing     The patient denies symptoms of fevers, chills, cough, or new SOB worrisome for COVID 19.    Past Medical History:  Diagnosis Date  . Hyperlipidemia   . Hypertension   . Myasthenia gravis ([place])   . Ventricular tachycardia [facility])     Past Surgical History:  Procedure Laterality Date  . carpel tunnel sx    . CATARACT EXTRACTION     right eye  . SKIN GRAFT     Behind left knee  . TONSILLECTOMY AND ADENOIDECTOMY      Current Outpatient Medications  Medication Sig Dispense Refill  . albuterol (PROVENTIL HFA;VENTOLIN HFA) 108 (90 Base) MCG/ACT inhaler Inhale into the lungs every 6 (six) hours as needed for wheezing or shortness of breath.    Marland Kitchen amiodarone (PACERONE) 400 MG tablet Take 400 mg by mouth daily.    Marland Kitchen amLODipine (NORVASC) 5 MG tablet Take 1 tablet (5 mg total) by mouth daily. 90 tablet 3  . aspirin EC 81 MG tablet Take 81 mg by mouth daily.    . carboxymethylcellulose 1 % ophthalmic solution 1 drop as needed.    . famotidine (PEPCID) 20 MG tablet Take 20 mg by mouth as needed for heartburn or indigestion.    . gabapentin (NEURONTIN) 300 MG capsule Take 600 mg by mouth at bedtime.    Marland Kitchen HYDROcodone-acetaminophen (NORCO/VICODIN) 5-325 MG  tablet Take 1 tablet by mouth every 6 (six) hours as needed for moderate pain. 120 tablet 0  . mycophenolate (CELLCEPT) 500 MG tablet Take 500 mg by mouth 2 (two) times daily.    Marland Kitchen omeprazole (PRILOSEC) 40 MG capsule Take 40 mg by mouth daily.    . pramipexole (MIRAPEX) 0.5 MG tablet Take 1 tablet (0.5 mg total) by mouth at bedtime. 30 tablet 11  . pravastatin (PRAVACHOL) 20 MG tablet Take 20 mg by mouth daily.    . predniSONE (DELTASONE) 20 MG tablet Take 50 mg by mouth daily with breakfast.     . pyridostigmine (MESTINON) 60 MG tablet Take 60 mg by mouth 4 (four) times daily.     Marland Kitchen tiotropium (SPIRIVA) 18 MCG inhalation capsule Place 18 mcg into inhaler and inhale daily.    Marland Kitchen tolnaftate (TINACTIN) 1 % cream Apply 1 application topically 2  (two) times daily as needed.     . torsemide (DEMADEX) 20 MG tablet Take 2 tablets (40 mg total) once daily for 3 days, then take 1 tablet (20 mg total) once daily thereafter. 93 tablet 3  . vitamin B-12 (CYANOCOBALAMIN) 1000 MCG tablet Take 1,000 mcg by mouth daily.     No current facility-administered medications for this visit.     Allergies:   Patient has no known allergies.   Social History:  The patient  reports that he quit smoking about 20 months ago. His smoking use included pipe and cigarettes. He has a 20.00 pack-year smoking history. He has quit using smokeless tobacco. He reports current alcohol use. He reports that he does not use drugs.   Family History:  The patient's  family history includes Arthritis in his father, mother, and another family member; Heart attack in his father; Heart disease (age of onset: 33) in his father; Hypertension in his mother; Kidney disease in his mother; Stroke in his father; Thyroid disease in his mother.   ROS:  Please see the history of present illness.   All other systems are personally reviewed and negative.    Exam:    Vital Signs:  BP (!) 154/82 (BP Location: Left Arm, Patient Position: Sitting, Cuff Size: Normal)   Pulse 65   Ht 5\' 7"  (1.702 m)   Wt 231 lb 12.8 oz (105.1 kg)   SpO2 96%   BMI 36.31 kg/m    Well developed and nourished in no acute distress HENT normal Neck supple with JVP-  flat   Clear Device pocket well healed; without hematoma or erythema.  There is no tethering  Regular rate and rhythm, no murmurs or gallops Abd-soft with active BS No Clubbing cyanosis L>.R edema; knee injury  Skin-warm and dry A & Oriented  Grossly normal sensory and motor function  ECG   5/20 was reviewed personally.  Demonstrated P synchronous pacing with QRS upright lead V1 and inverted lead I  Labs/Other Tests and Data Reviewed:    Recent Labs: 03/20/2019: Magnesium 2.3 04/14/2019: ALT 31; BUN 21; Creatinine, Ser 1.18; Hemoglobin 15.5;  Platelets 169; Potassium 3.7; Sodium 138; TSH 0.201   Wt Readings from Last 3 Encounters:  05/13/19 231 lb 12.8 oz (105.1 kg)  05/07/19 231 lb 12.8 oz (105.1 kg)  04/14/19 231 lb (104.8 kg)     Other studies personally reviewed: Additional studies/ records that were reviewed today include: As above   Device was interrogated in the office.  LV threshold was 1.5 V at 1 ms and 2.25 V at 0.4 ms.  Auto threshold needed changed the output of 4.5 V.  I have reprogrammed 2 .5 V at 1 ms   ASSESSMENT & PLAN:   Ventricular Tachycardia-nonsustained  Asymmetric septal Hypertrophy  Heart failure, diastolic chronic   DOE   Myasthenia Gravis   Hyperthyroidism-borderline   As best as I can tell the patient's ventricular tachycardia was nonsustained.  We will decrease the amiodarone from 400--200 mg a day   We will reassess TSH  His dyspnea is possibly multifactorial.  There is some degree of volume overload; we will increase his torsemide from 20--40 mg daily and this can be reassessed by his myasthenia doctor next week.  I wonder whether the myasthenia may be aggravating his dyspnea as he has ongoing swallowing and ocular symptoms.  Would need her input regarding this.  We will also ask her to draw a BNP at her office visit  It would be unusual for possible that this could represent early amiodarone lung toxicity.  His cardiac evaluation was notable for a comment in his MRI report regarding asymmetric septal hypertrophy.  This raises the concern of hypertrophic cardiomyopathy, importance of which is not so much related to the patient as in family risk  Once we can get clarification of the report, you may reach out to Dr. Mina Marble at [facility] to see if he would mind looking at the cMRI and offer his insights.    COVID 19 screen The patient denies symptoms of COVID 19 at this time.  The importance of social distancing was discussed today.  Follow-up:  3 months Next remote:  na  Current  medicines are reviewed at length with the patient today.   The patient does not have concerns regarding his medicines.  The following changes were made today:   Decrease amio As above Increase torsemide As above   Labs/ tests ordered today include: As above  No orders of the defined types were placed in this encounter.   Future tests ( post COVID )   in   months  Patient Risk:  after full review of this patients clinical status, I feel that they are at moderate risk at this time.     Signed, Virl Axe, MD  05/13/2019 8:20 AM     [facility] [address] [place] [address] [phone] (office) [phone] (fax)

## 2019-05-13 ENCOUNTER — Encounter: Payer: Self-pay | Admitting: Internal Medicine

## 2019-05-13 ENCOUNTER — Other Ambulatory Visit: Payer: Self-pay

## 2019-05-13 ENCOUNTER — Ambulatory Visit (INDEPENDENT_AMBULATORY_CARE_PROVIDER_SITE_OTHER): Payer: PPO | Admitting: Internal Medicine

## 2019-05-13 VITALS — Ht 67.0 in | Wt 231.8 lb

## 2019-05-13 DIAGNOSIS — I472 Ventricular tachycardia, unspecified: Secondary | ICD-10-CM

## 2019-05-13 DIAGNOSIS — G7000 Myasthenia gravis without (acute) exacerbation: Secondary | ICD-10-CM

## 2019-05-13 DIAGNOSIS — R0609 Other forms of dyspnea: Secondary | ICD-10-CM

## 2019-05-13 MED ORDER — TORSEMIDE 20 MG PO TABS: ORAL_TABLET | ORAL | Status: DC

## 2019-05-13 MED ORDER — AMIODARONE HCL 200 MG PO TABS: 200.0000 mg | ORAL_TABLET | Freq: Every day | ORAL | Status: DC

## 2019-05-13 NOTE — Patient Instructions (Addendum)
Medication Instructions:  - Your physician has recommended you make the following change in your medication:   1) Decrease amiodarone to 200 mg- take 1 tablet (200 mg) by mouth once daily  2) Increase demadex (torsemide) 20 mg- take 2 tablets (40 mg) by mouth once daily until seen in clinic next week with your myasthenia doctor.  If you need a refill on your cardiac medications before your next appointment, please call your pharmacy.   Lab work: - Your physician recommends that you have lab work with the [place] next week: CMET/ TSH/ BNP  If you have labs (blood work) drawn today and your tests are completely normal, you will receive your results only by: Marland Kitchen MyChart Message (if you have MyChart) OR . A paper copy in the mail If you have any lab test that is abnormal or we need to change your treatment, we will call you to review the results.  Testing/Procedures: - none ordered  Follow-Up: At [facility], you and your health needs are our priority.  As part of our continuing mission to provide you with exceptional heart care, we have created designated Provider Care Teams.  These Care Teams include your primary Cardiologist (physician) and Advanced Practice Providers (APPs -  Physician Assistants and Nurse Practitioners) who all work together to provide you with the care you need, when you need it. . pending Dr. Olin Pia review of your Cardiac MRI from [facility] & the Cardiology Consultation report from [facility]  Any Other Special Instructions Will Be Listed Below (If Applicable). - Please watch your fluid intake

## 2019-05-13 NOTE — Progress Notes (Signed)
Error

## 2019-05-27 ENCOUNTER — Encounter: Payer: Self-pay | Admitting: Family Medicine

## 2019-05-27 DIAGNOSIS — G7000 Myasthenia gravis without (acute) exacerbation: Secondary | ICD-10-CM | POA: Diagnosis not present

## 2019-05-28 ENCOUNTER — Telehealth: Payer: Self-pay | Admitting: Family Medicine

## 2019-05-28 NOTE — Telephone Encounter (Signed)
Oh, I didn't notice that the 9:20 was a virtual visit. The mychart visits aren't working, so the [employer] visit will need to be changed to  a doxy[employer]me visit. You could let him know that that we can't do it on mychart and move him to 9:40 double booking with the 9:40 office slot.

## 2019-05-28 NOTE — Telephone Encounter (Signed)
OK for patient to be seen in office.   Can come in at 2pm or open up my 9am appointment on Friday.

## 2019-05-28 NOTE — Telephone Encounter (Signed)
Appointment scheduled for Friday at [place]. Please note that you have a virtual visit on Friday at 9:20am. Let me know if I should change this patient to a different time. Thanks.

## 2019-05-29 NOTE — Telephone Encounter (Signed)
Apt times have been fixed.   Thanks,   -Mickel Baas

## 2019-05-30 ENCOUNTER — Ambulatory Visit
Admission: RE | Admit: 2019-05-30 | Discharge: 2019-05-30 | Disposition: A | Discharge status: Home / Self Care | Payer: PPO | Attending: Family Medicine | Admitting: Family Medicine

## 2019-05-30 ENCOUNTER — Ambulatory Visit
Admission: RE | Admit: 2019-05-30 | Discharge: 2019-05-30 | Disposition: A | Discharge status: Home / Self Care | Payer: PPO | Source: Ambulatory Visit | Attending: Family Medicine | Admitting: Family Medicine

## 2019-05-30 ENCOUNTER — Ambulatory Visit (INDEPENDENT_AMBULATORY_CARE_PROVIDER_SITE_OTHER): Payer: PPO | Admitting: Family Medicine

## 2019-05-30 ENCOUNTER — Other Ambulatory Visit: Payer: Self-pay

## 2019-05-30 ENCOUNTER — Encounter: Payer: Self-pay | Admitting: Family Medicine

## 2019-05-30 VITALS — BP 132/64 | HR 68 | Temp 98.3°F | Wt 242.0 lb

## 2019-05-30 DIAGNOSIS — I5032 Chronic diastolic (congestive) heart failure: Secondary | ICD-10-CM | POA: Diagnosis not present

## 2019-05-30 DIAGNOSIS — J449 Chronic obstructive pulmonary disease, unspecified: Secondary | ICD-10-CM | POA: Diagnosis not present

## 2019-05-30 DIAGNOSIS — R05 Cough: Secondary | ICD-10-CM | POA: Diagnosis not present

## 2019-05-30 DIAGNOSIS — R601 Generalized edema: Secondary | ICD-10-CM

## 2019-05-30 DIAGNOSIS — R0602 Shortness of breath: Secondary | ICD-10-CM

## 2019-05-30 MED ORDER — METOLAZONE 5 MG PO TABS: 5.0000 mg | ORAL_TABLET | Freq: Every day | ORAL | 0 refills | Status: DC | PRN

## 2019-05-30 NOTE — Patient Instructions (Addendum)
.   Please review the attached list of medications and notify my office if there are any errors.   Joseph Macdonald to the [facility] on [facility] for chest Xray

## 2019-05-30 NOTE — Progress Notes (Signed)
Patient: Joseph Macdonald Male    DOB: Jul 25, 1946   73 y.o.   MRN: [id number] Visit Date: 05/30/2019  Today's Provider: Lelon Huh, MD   Chief Complaint  Patient presents with  . Shortness of Breath    Started in Feb, but worsening in the last two weeks.    Subjective:     Shortness of Breath This is a chronic problem. The problem has been rapidly worsening (Especially in the last few days.). Associated symptoms include leg swelling and wheezing. Pertinent negatives include no abdominal pain, chest pain, fever, headaches, leg pain, rhinorrhea, sputum production or vomiting. His past medical history is significant for a heart failure.  He has history of HFpEF previously follow at [facility], but is now established with cardiology in [place] and followed by Dr. Caryl Comes, Dr End and Alinda Sierras. He had been on hctz which was changed to furosemide the first of April due to above symptoms. Was changed to torsemide a few weeks ago, but swelling continued to worsen and he went back to furosemide a few days ago. He states he was at the [place] on 05-20-2019 and his weight was 225. Had labs done at that time which he shows me on his phone today. He had creatinine of 0.9, bun of 22, na of 139 and k=3.4. slightly elevated WBC of 12, the remainder of his blood count and metabolic panel were normal.   He states he has had more wheezing lately with non-productive cough at night. No fevers, chills or sweats.    Wt Readings from Last 10 Encounters:  05/30/19 242 lb (109.8 kg)  05/13/19 231 lb 12.8 oz (105.1 kg)  05/07/19 231 lb 12.8 oz (105.1 kg)  04/14/19 231 lb (104.8 kg)  04/11/19 222 lb (100.7 kg)  03/19/19 233 lb (105.7 kg)  03/12/19 233 lb 8 oz (105.9 kg)  01/13/19 232 lb (105.2 kg)  10/28/18 220 lb 9.6 oz (100.1 kg)  12/28/17 228 lb (103.4 kg)    No Known Allergies   Current Outpatient Medications:  .  albuterol (PROVENTIL HFA;VENTOLIN HFA) 108 (90 Base) MCG/ACT inhaler, Inhale into the lungs  every 6 (six) hours as needed for wheezing or shortness of breath., Disp: , Rfl:  .  amiodarone (PACERONE) 200 MG tablet, Take 1 tablet (200 mg total) by mouth daily. (Patient taking differently: Take 20 mg by mouth daily. ), Disp: , Rfl:  .  amLODipine (NORVASC) 5 MG tablet, Take 1 tablet (5 mg total) by mouth daily., Disp: 90 tablet, Rfl: 3 .  aspirin EC 81 MG tablet, Take 81 mg by mouth daily., Disp: , Rfl:  .  carboxymethylcellulose 1 % ophthalmic solution, 1 drop as needed., Disp: , Rfl:  .  famotidine (PEPCID) 20 MG tablet, Take 20 mg by mouth as needed for heartburn or indigestion., Disp: , Rfl:  .  furosemide (LASIX) 40 MG tablet, Take 40 mg by mouth., Disp: , Rfl:  .  gabapentin (NEURONTIN) 300 MG capsule, Take 600 mg by mouth at bedtime., Disp: , Rfl:  .  HYDROcodone-acetaminophen (NORCO/VICODIN) 5-325 MG tablet, Take 1 tablet by mouth every 6 (six) hours as needed for moderate pain., Disp: 120 tablet, Rfl: 0 .  mycophenolate (CELLCEPT) 500 MG tablet, Take 500 mg by mouth 2 (two) times daily., Disp: , Rfl:  .  omeprazole (PRILOSEC) 40 MG capsule, Take 40 mg by mouth daily., Disp: , Rfl:  .  pramipexole (MIRAPEX) 0.5 MG tablet, Take 1 tablet (0.5  mg total) by mouth at bedtime., Disp: 30 tablet, Rfl: 11 .  pravastatin (PRAVACHOL) 20 MG tablet, Take 20 mg by mouth daily., Disp: , Rfl:  .  predniSONE (DELTASONE) 20 MG tablet, Take 40 mg by mouth daily with breakfast. , Disp: , Rfl:  .  tiotropium (SPIRIVA) 18 MCG inhalation capsule, Place 18 mcg into inhaler and inhale daily., Disp: , Rfl:  .  tolnaftate (TINACTIN) 1 % cream, Apply 1 application topically 2 (two) times daily as needed. , Disp: , Rfl:  .  vitamin B-12 (CYANOCOBALAMIN) 1000 MCG tablet, Take 1,000 mcg by mouth daily., Disp: , Rfl:  .  pyridostigmine (MESTINON) 60 MG tablet, Take 60 mg by mouth 4 (four) times daily. , Disp: , Rfl:  .  torsemide (DEMADEX) 20 MG tablet, Take 2 tablets (40 mg total) once daily (Patient not taking:  Reported on 05/30/2019), Disp: , Rfl:   Review of Systems  Constitutional: Positive for fatigue. Negative for activity change, appetite change, chills, diaphoresis, fever and unexpected weight change.  HENT: Negative for rhinorrhea.   Respiratory: Positive for cough, chest tightness, shortness of breath and wheezing. Negative for apnea, sputum production, choking and stridor.   Cardiovascular: Positive for leg swelling. Negative for chest pain.  Gastrointestinal: Positive for abdominal distention and nausea. Negative for abdominal pain, anal bleeding, blood in stool, constipation, diarrhea, rectal pain and vomiting.  Neurological: Positive for light-headedness. Negative for dizziness and headaches.    Social History   Tobacco Use  . Smoking status: Former Smoker    Packs/day: 0.50    Years: 40.00    Pack years: 20.00    Types: Pipe, Cigarettes    Quit date: 08/19/2017    Years since quitting: 1.7  . Smokeless tobacco: Former Systems developer  . Tobacco comment: previously smoked 1 1/2 pack since his 73 years old  Substance Use Topics  . Alcohol use: Yes    Comment: occasionally      Objective:   BP 132/64 (BP Location: Right Arm, Patient Position: Sitting, Cuff Size: Large)   Pulse 68   Temp 98.3 F (36.8 C) (Oral)   Wt 242 lb (109.8 kg)   SpO2 95%   BMI 37.90 kg/m  Vitals:   05/30/19 0855  BP: 132/64  Pulse: 68  Temp: 98.3 F (36.8 C)  TempSrc: Oral  SpO2: 95%  Weight: 242 lb (109.8 kg)     Physical Exam   General Appearance:    Alert, cooperative, no distress  Eyes:    PERRL, conjunctiva/corneas clear, EOM's intact       Lungs:     Occasional expiratory wheeze, no rales or rhochi, respirations unlabored  Heart:    Regular rate and rhythm  Neurologic:   Awake, alert, oriented x 3. No apparent focal neurological           defect.   Ext:  2+ bipedal and ankle edema. No wounds or erythema.        Assessment & Plan    1. Shortness of breath Likely secondary to worsening  CHF. May be exacerbated by COPD as below.  - DG Chest 2 View; Future  2. Chronic heart failure with preserved ejection fraction (HFpEF) ([place])  Will add prn zaroxolyn 5mg  a day prn He reports he has heart monitor and echo being scheduled at [facility]  3. Chronic obstructive pulmonary disease, unspecified COPD type ([place]) On Spiriva. See how chest XR looks. Consider adding a second maintenance inhaler. Spirometry not available at this time.  4. Edema  Likely secondary to CHF. Labs from [place] 05-20-2019 including renal function were unremarkable.    The entirety of the information documented in the History of Present Illness, Review of Systems and Physical Exam were personally obtained by me. Portions of this information were initially documented by Ashley Royalty, CMA and reviewed by me for thoroughness and accuracy.      Lelon Huh, MD  [facility]

## 2019-06-02 ENCOUNTER — Encounter: Payer: Self-pay | Admitting: Family Medicine

## 2019-06-02 ENCOUNTER — Telehealth: Payer: Self-pay

## 2019-06-02 NOTE — Telephone Encounter (Signed)
LMTCB 06/02/2019  Thanks,   -Mickel Baas

## 2019-06-02 NOTE — Telephone Encounter (Signed)
Notes recorded by [name], MD on 05/30/2019 at 3:11 PM EDT  Chest xray shows enlarged heart, but is otherwise clear. Go ahead and start metolazone that was sent to pharmacy and let me know if not better over the weekend.

## 2019-06-02 NOTE — Telephone Encounter (Signed)
-----  Message from [name], MD sent at 06/02/2019  1:10 PM EDT ----- Also, If swelling is no better they can increase metolazone to 2 tablets daily. Need to recheck met B and BNP on Thursday.

## 2019-06-04 ENCOUNTER — Encounter: Payer: Self-pay | Admitting: Family Medicine

## 2019-06-04 NOTE — Telephone Encounter (Signed)
Yes, the metolazone is in addition to furosmide.

## 2019-06-04 NOTE — Telephone Encounter (Signed)
Pt advised.   Thanks,   -Ayvin Lipinski  

## 2019-06-04 NOTE — Telephone Encounter (Signed)
Pt advised.  He wanted to know if he should take the furosemide as well as 2 metolazone?    Please advise.   Thanks,   -Mickel Baas

## 2019-06-10 ENCOUNTER — Other Ambulatory Visit: Payer: Self-pay

## 2019-06-10 ENCOUNTER — Emergency Department: Payer: No Typology Code available for payment source

## 2019-06-10 ENCOUNTER — Encounter: Payer: Self-pay | Admitting: Emergency Medicine

## 2019-06-10 ENCOUNTER — Inpatient Hospital Stay
Admission: EM | Admit: 2019-06-10 | Discharge: 2019-06-13 | DRG: 641 | Disposition: A | Discharge status: Home / Self Care | Payer: No Typology Code available for payment source | Attending: Internal Medicine | Admitting: Internal Medicine

## 2019-06-10 DIAGNOSIS — Z87891 Personal history of nicotine dependence: Secondary | ICD-10-CM | POA: Diagnosis not present

## 2019-06-10 DIAGNOSIS — I10 Essential (primary) hypertension: Secondary | ICD-10-CM | POA: Diagnosis not present

## 2019-06-10 DIAGNOSIS — Z79899 Other long term (current) drug therapy: Secondary | ICD-10-CM | POA: Diagnosis not present

## 2019-06-10 DIAGNOSIS — R0789 Other chest pain: Secondary | ICD-10-CM | POA: Diagnosis not present

## 2019-06-10 DIAGNOSIS — A084 Viral intestinal infection, unspecified: Secondary | ICD-10-CM | POA: Diagnosis not present

## 2019-06-10 DIAGNOSIS — E876 Hypokalemia: Secondary | ICD-10-CM | POA: Diagnosis not present

## 2019-06-10 DIAGNOSIS — R110 Nausea: Secondary | ICD-10-CM | POA: Diagnosis not present

## 2019-06-10 DIAGNOSIS — E871 Hypo-osmolality and hyponatremia: Secondary | ICD-10-CM

## 2019-06-10 DIAGNOSIS — Z8249 Family history of ischemic heart disease and other diseases of the circulatory system: Secondary | ICD-10-CM | POA: Diagnosis not present

## 2019-06-10 DIAGNOSIS — N179 Acute kidney failure, unspecified: Secondary | ICD-10-CM | POA: Diagnosis not present

## 2019-06-10 DIAGNOSIS — Z823 Family history of stroke: Secondary | ICD-10-CM | POA: Diagnosis not present

## 2019-06-10 DIAGNOSIS — Z1159 Encounter for screening for other viral diseases: Secondary | ICD-10-CM

## 2019-06-10 DIAGNOSIS — Z7951 Long term (current) use of inhaled steroids: Secondary | ICD-10-CM

## 2019-06-10 DIAGNOSIS — N189 Chronic kidney disease, unspecified: Secondary | ICD-10-CM | POA: Diagnosis not present

## 2019-06-10 DIAGNOSIS — I5020 Unspecified systolic (congestive) heart failure: Secondary | ICD-10-CM | POA: Diagnosis present

## 2019-06-10 DIAGNOSIS — R0902 Hypoxemia: Secondary | ICD-10-CM | POA: Diagnosis not present

## 2019-06-10 DIAGNOSIS — Z841 Family history of disorders of kidney and ureter: Secondary | ICD-10-CM | POA: Diagnosis not present

## 2019-06-10 DIAGNOSIS — Z8261 Family history of arthritis: Secondary | ICD-10-CM

## 2019-06-10 DIAGNOSIS — Z79891 Long term (current) use of opiate analgesic: Secondary | ICD-10-CM | POA: Diagnosis not present

## 2019-06-10 DIAGNOSIS — Z8349 Family history of other endocrine, nutritional and metabolic diseases: Secondary | ICD-10-CM | POA: Diagnosis not present

## 2019-06-10 DIAGNOSIS — Z7952 Long term (current) use of systemic steroids: Secondary | ICD-10-CM

## 2019-06-10 DIAGNOSIS — E860 Dehydration: Secondary | ICD-10-CM | POA: Diagnosis not present

## 2019-06-10 DIAGNOSIS — J449 Chronic obstructive pulmonary disease, unspecified: Secondary | ICD-10-CM | POA: Diagnosis present

## 2019-06-10 DIAGNOSIS — R197 Diarrhea, unspecified: Secondary | ICD-10-CM | POA: Diagnosis not present

## 2019-06-10 DIAGNOSIS — G7000 Myasthenia gravis without (acute) exacerbation: Secondary | ICD-10-CM | POA: Diagnosis present

## 2019-06-10 DIAGNOSIS — E785 Hyperlipidemia, unspecified: Secondary | ICD-10-CM | POA: Diagnosis not present

## 2019-06-10 DIAGNOSIS — Z7982 Long term (current) use of aspirin: Secondary | ICD-10-CM

## 2019-06-10 DIAGNOSIS — I130 Hypertensive heart and chronic kidney disease with heart failure and stage 1 through stage 4 chronic kidney disease, or unspecified chronic kidney disease: Secondary | ICD-10-CM | POA: Diagnosis not present

## 2019-06-10 DIAGNOSIS — R109 Unspecified abdominal pain: Secondary | ICD-10-CM | POA: Diagnosis not present

## 2019-06-10 LAB — URINALYSIS, COMPLETE (UACMP) WITH MICROSCOPIC
Bacteria, UA: NONE SEEN
Bilirubin Urine: NEGATIVE
Glucose, UA: NEGATIVE mg/dL
Ketones, ur: NEGATIVE mg/dL
Leukocytes,Ua: NEGATIVE
Nitrite: NEGATIVE
Protein, ur: 30 mg/dL — AB
Specific Gravity, Urine: >1.046 — ABNORMAL HIGH (ref 1.005–1.030)
pH: 7 (ref 5.0–8.0)

## 2019-06-10 LAB — COMPREHENSIVE METABOLIC PANEL
ALT: 37 U/L (ref 0–44)
AST: 45 U/L — ABNORMAL HIGH (ref 15–41)
Albumin: 4.4 g/dL (ref 3.5–5.0)
Alkaline Phosphatase: 47 U/L (ref 38–126)
BUN: 31 mg/dL — ABNORMAL HIGH (ref 8–23)
CO2: 43 mmol/L — ABNORMAL HIGH (ref 22–32)
Calcium: 9.1 mg/dL (ref 8.9–10.3)
Chloride: <65 mmol/L — CL (ref 98–111)
Creatinine, Ser: 1.73 mg/dL — ABNORMAL HIGH (ref 0.61–1.24)
GFR calc Af Amer: 45 mL/min — ABNORMAL LOW (ref >60)
GFR calc non Af Amer: 39 mL/min — ABNORMAL LOW (ref >60)
Glucose, Bld: 181 mg/dL — ABNORMAL HIGH (ref 70–99)
Potassium: 2.3 mmol/L — CL (ref 3.5–5.1)
Sodium: 129 mmol/L — ABNORMAL LOW (ref 135–145)
Total Bilirubin: 1.9 mg/dL — ABNORMAL HIGH (ref 0.3–1.2)
Total Protein: 7.6 g/dL (ref 6.5–8.1)

## 2019-06-10 LAB — TROPONIN I (HIGH SENSITIVITY)
Troponin I (High Sensitivity): 37 ng/L — ABNORMAL HIGH (ref <18)
Troponin I (High Sensitivity): 40 ng/L — ABNORMAL HIGH (ref <18)

## 2019-06-10 LAB — CBC
HCT: 53.5 % — ABNORMAL HIGH (ref 39.0–52.0)
Hemoglobin: 18.3 g/dL — ABNORMAL HIGH (ref 13.0–17.0)
MCH: 30.9 pg (ref 26.0–34.0)
MCHC: 34.2 g/dL (ref 30.0–36.0)
MCV: 90.4 fL (ref 80.0–100.0)
Platelets: 194 10*3/uL (ref 150–400)
RBC: 5.92 MIL/uL — ABNORMAL HIGH (ref 4.22–5.81)
RDW: 13.4 % (ref 11.5–15.5)
WBC: 14.9 10*3/uL — ABNORMAL HIGH (ref 4.0–10.5)
nRBC: 0 % (ref 0.0–0.2)

## 2019-06-10 LAB — MAGNESIUM: Magnesium: 2.9 mg/dL — ABNORMAL HIGH (ref 1.7–2.4)

## 2019-06-10 LAB — BLOOD GAS, VENOUS
Acid-Base Excess: 34.7 mmol/L — ABNORMAL HIGH (ref 0.0–2.0)
Bicarbonate: 62.8 mmol/L — ABNORMAL HIGH (ref 20.0–28.0)
O2 Saturation: 55.9 %
Patient temperature: 37
pCO2, Ven: 67 mmHg — ABNORMAL HIGH (ref 44.0–60.0)
pH, Ven: 7.58 — ABNORMAL HIGH (ref 7.250–7.430)
pO2, Ven: <31 mmHg — CL (ref 32.0–45.0)

## 2019-06-10 LAB — SARS CORONAVIRUS 2 BY RT PCR (HOSPITAL ORDER, PERFORMED IN ~~LOC~~ HOSPITAL LAB): SARS Coronavirus 2: NEGATIVE

## 2019-06-10 LAB — BRAIN NATRIURETIC PEPTIDE: B Natriuretic Peptide: 144 pg/mL — ABNORMAL HIGH (ref 0.0–100.0)

## 2019-06-10 LAB — LIPASE, BLOOD: Lipase: 30 U/L (ref 11–51)

## 2019-06-10 MED ORDER — SODIUM CHLORIDE 0.9 % IV SOLN
Freq: Once | INTRAVENOUS | Status: AC
  Administered 2019-06-11: via INTRAVENOUS

## 2019-06-10 MED ORDER — MAGNESIUM SULFATE 2 GM/50ML IV SOLN
2.0000 g | Freq: Once | INTRAVENOUS | Status: AC
  Administered 2019-06-10: 2 g via INTRAVENOUS
  Filled 2019-06-10: qty 50

## 2019-06-10 MED ORDER — POTASSIUM CHLORIDE 10 MEQ/100ML IV SOLN
10.0000 meq | INTRAVENOUS | Status: AC
  Administered 2019-06-10 – 2019-06-11 (×3): 10 meq via INTRAVENOUS
  Filled 2019-06-10 (×4): qty 100

## 2019-06-10 MED ORDER — IOHEXOL 300 MG/ML  SOLN
75.0000 mL | Freq: Once | INTRAMUSCULAR | Status: AC | PRN
  Administered 2019-06-10: 75 mL via INTRAVENOUS

## 2019-06-10 MED ORDER — SODIUM CHLORIDE 0.9 % IV BOLUS
500.0000 mL | Freq: Once | INTRAVENOUS | Status: AC
  Administered 2019-06-10: 500 mL via INTRAVENOUS

## 2019-06-10 MED ORDER — POTASSIUM CHLORIDE 10 MEQ/100ML IV SOLN
10.0000 meq | Freq: Once | INTRAVENOUS | Status: AC
  Administered 2019-06-10: 18:00:00 10 meq via INTRAVENOUS
  Filled 2019-06-10: qty 100

## 2019-06-10 MED ORDER — IPRATROPIUM-ALBUTEROL 0.5-2.5 (3) MG/3ML IN SOLN
3.0000 mL | Freq: Once | RESPIRATORY_TRACT | Status: AC
  Administered 2019-06-11: 3 mL via RESPIRATORY_TRACT
  Filled 2019-06-10: qty 3

## 2019-06-10 MED ORDER — METHYLPREDNISOLONE SODIUM SUCC 125 MG IJ SOLR
125.0000 mg | Freq: Once | INTRAMUSCULAR | Status: AC
  Administered 2019-06-11: 125 mg via INTRAVENOUS
  Filled 2019-06-10: qty 2

## 2019-06-10 MED ORDER — ALBUTEROL SULFATE (2.5 MG/3ML) 0.083% IN NEBU
2.5000 mg | INHALATION_SOLUTION | Freq: Once | RESPIRATORY_TRACT | Status: AC
  Administered 2019-06-10: 20:00:00 2.5 mg via RESPIRATORY_TRACT
  Filled 2019-06-10: qty 3

## 2019-06-10 MED ORDER — SODIUM CHLORIDE 0.9% FLUSH
3.0000 mL | Freq: Once | INTRAVENOUS | Status: AC
  Administered 2019-06-10: 3 mL via INTRAVENOUS

## 2019-06-10 NOTE — ED Provider Notes (Addendum)
Finally received call back from [facility].  They are at capacity and do not have any bed availability therefore recommending admission to this facility pending bed availability at [facility], Saralyn Pilar, MD 06/10/19 [place]    Merlyn Lot, MD 06/10/19 [phone]

## 2019-06-10 NOTE — ED Provider Notes (Signed)
I saw patient in conjunction with nurse practitioner Triplett.  In short patient does appear dehydrated with acute hyponatremia and hypokalemia with concentrated urine as the patient is not been able to tolerate much to eat or drink.  Given his history I am concerned for myasthenic crisis.  Does have mild hypoxia is requiring 2 L nasal cannula.  Blood gas shows that he is got probable primary contraction alkalosis with partial respiratory compensation does have hypercapnia but he is mentating well.  Will give nebs.  Discussed option for BiPAP as the patient is not in any extremis and states that he is not able to tolerate any mass at this time will just continue to observe as this may be primarily metabolic.  [facility]  I have given IV steroids for possible myasthenic crisis.  No evidence of congestive heart failure.  No evidence of infectious process.   Merlyn Lot, MD 06/10/19 [phone]

## 2019-06-10 NOTE — ED Notes (Signed)
Off unit to ct 

## 2019-06-10 NOTE — ED Notes (Signed)
Pt placed on O2 via n/c for sat 76% on RA with good waveform. Pt required 4L to obtain sat >90%. EDP Triplett and pt's primary nurse notified.

## 2019-06-10 NOTE — ED Notes (Signed)
Patient awaiting transfer to [facility]

## 2019-06-10 NOTE — ED Triage Notes (Signed)
Pt to ER states c/o abdominal pain, chest pressure and severe nausea.  Pt states started 4 days ago, got better and then got worse again.  Pt denies vomiting, denies cough.

## 2019-06-10 NOTE — ED Notes (Signed)
covid swab abtained and sent to lab.

## 2019-06-10 NOTE — ED Provider Notes (Signed)
[facility] Emergency Department Provider Note  ___________________________________________   First MD Initiated Contact with Patient 06/10/19 1738     (approximate)  I have reviewed the triage vital signs and the nursing notes.   HISTORY  Chief Complaint Abdominal Pain, Chest Pain, and Nausea  HPI Joseph Macdonald is a 73 y.o. male who presents to the emergency department for treatment and evaluation of abdominal pain, chest pressure, and severe nausea. Symptoms started 4 days ago and have progressively worsened. After eating or drinking anything, cough is induced but "nothing comes up." He denies fever. Stool has been the consistency of "pudding" x 4 days. [facility] and can then transfer if needed to be admitted. No alleviating measures attempted prior to arrival.    Past Medical History:  Diagnosis Date  . Hyperlipidemia   . Hypertension   . Myasthenia gravis ([place])   . Ventricular tachycardia [facility])     Patient Active Problem List   Diagnosis Date Noted  . Acute on chronic kidney failure ([place]) 06/11/2019  . Shortness of breath 04/11/2019  . Epigastric pain 04/11/2019  . Hypokalemia 04/11/2019  . Ventricular tachycardia ([place]) 03/12/2019  . Chronic heart failure with preserved ejection fraction (HFpEF) ([place]) 03/12/2019  . Chest pain 03/12/2019  . COPD (chronic obstructive pulmonary disease) ([place]) 01/13/2019  . Pre-diabetes 03/19/2017  . Chronic narcotic use 01/17/2016  . Myasthenia gravis ([place]) 09/17/2015  . Anxiety 08/17/2015  . 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 Clinical depression 08/17/2015  . Acid reflux 08/17/2015  . Hemorrhoid 08/17/2015  . Essential hypertension 08/17/2015  . Bad memory 08/17/2015  . 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 Allergic rhinitis, seasonal 08/17/2015  . Smoking greater than 30 pack years 08/17/2015  . Restless  leg syndrome 08/17/2015    Past Surgical History:  Procedure Laterality Date  . carpel tunnel sx    . CATARACT EXTRACTION     right eye  . SKIN GRAFT     Behind left knee  . TONSILLECTOMY AND ADENOIDECTOMY      Prior to Admission medications   Medication Sig Start Date End Date Taking? Authorizing Provider  albuterol (PROVENTIL HFA;VENTOLIN HFA) 108 (90 Base) MCG/ACT inhaler Inhale into the lungs every 6 (six) hours as needed for wheezing or shortness of breath.   Yes [provider]  amLODipine (NORVASC) 5 MG tablet Take 1 tablet (5 mg total) by mouth daily. 03/12/19 06/10/19 Yes End, Harrell Gave, MD  aspirin EC 81 MG tablet Take 81 mg by mouth daily.   Yes [provider]  famotidine (PEPCID) 20 MG tablet Take 20 mg by mouth as needed for heartburn or indigestion.   Yes [provider]  gabapentin (NEURONTIN) 300 MG capsule Take 600 mg by mouth at bedtime.   Yes [provider]  HYDROcodone-acetaminophen (NORCO/VICODIN) 5-325 MG tablet Take 1 tablet by mouth every 6 (six) hours as needed for moderate pain. 05/06/19  Yes [name], MD  metolazone (ZAROXOLYN) 5 MG tablet Take 1 tablet (5 mg total) by mouth daily as needed (swelling). 05/30/19  Yes [name], MD  omeprazole (PRILOSEC) 40 MG capsule Take 40 mg by mouth daily.   Yes [provider]  pramipexole (MIRAPEX) 0.5 MG tablet Take 1 tablet (0.5 mg total) by mouth at bedtime. 12/24/18  Yes [name], MD  pravastatin (PRAVACHOL) 20 MG tablet Take 20 mg by  mouth daily.   Yes [provider]  predniSONE (DELTASONE) 20 MG tablet Take 40 mg by mouth daily with breakfast.    Yes [provider]  pyridostigmine (MESTINON) 60 MG tablet Take 60 mg by mouth 4 (four) times daily.    Yes [provider]  tiotropium (SPIRIVA) 18 MCG inhalation capsule Place 18 mcg into inhaler and inhale daily.   Yes [provider]  torsemide (DEMADEX) 20 MG tablet  Take 2 tablets (40 mg total) once daily 05/13/19  Yes Deboraha Sprang, MD  vitamin B-12 (CYANOCOBALAMIN) 1000 MCG tablet Take 1,000 mcg by mouth daily.   Yes [provider]    Allergies Patient has no known allergies.  Family History  Problem Relation Age of Onset  . Hypertension Mother   . Thyroid disease Mother   . Kidney disease Mother   . Arthritis Mother   . Stroke Father   . Heart disease Father 59  . Arthritis Father   . Heart attack Father   . Arthritis Other     Social History Social History   Tobacco Use  . Smoking status: Former Smoker    Packs/day: 0.50    Years: 40.00    Pack years: 20.00    Types: Pipe, Cigarettes    Quit date: 08/19/2017    Years since quitting: 1.8  . Smokeless tobacco: Former Systems developer  . Tobacco comment: previously smoked 1 1/2 pack since his 73 years old  Substance Use Topics  . Alcohol use: Yes    Comment: occasionally  . Drug use: No    Review of Systems  Constitutional: No fever/chills Eyes: No visual changes. ENT: No sore throat. Cardiovascular: Positive for chest pain. Respiratory: Denies shortness of breath. Gastrointestinal: Positive for abdominal pain.  Positive for nausea, no vomiting.  Positive for loose stool.  No constipation. Genitourinary: Negative for dysuria. Musculoskeletal: Negative for back pain. Skin: Negative for rash. Neurological: Negative for headaches, focal weakness or numbness. ____________________________________________   PHYSICAL EXAM:  VITAL SIGNS: ED Triage Vitals  Enc Vitals Group     BP 06/10/19 1454 120/83     Pulse Rate 06/10/19 1454 80     Resp 06/10/19 1454 18     Temp 06/10/19 1454 97.9 F (36.6 C)     Temp src --      SpO2 06/10/19 1454 97 %     Weight 06/10/19 1455 230 lb (104.3 kg)     Height 06/10/19 1455 5\' 7"  (1.702 m)     Head Circumference --      Peak Flow --      Pain Score 06/10/19 1455 9     Pain Loc --      Pain Edu? --      Excl. in [place]? --      Constitutional: Alert and oriented. Well appearing and in no acute distress. Eyes: Conjunctivae are normal. Head: Atraumatic. Nose: No congestion/rhinnorhea. Mouth/Throat: Mucous membranes are moist.  Oropharynx non-erythematous. Neck: No stridor.   Cardiovascular: Normal rate, regular rhythm. Grossly normal heart sounds.  Good peripheral circulation. Respiratory: Normal respiratory effort.  No retractions. Lungs CTAB. Gastrointestinal: Soft and firm with distension. No abdominal bruits. No CVA tenderness. Musculoskeletal: No lower extremity tenderness nor edema.  No joint effusions. Neurologic:  Normal speech and language. No gross focal neurologic deficits are appreciated. No gait instability. Skin:  Skin is warm, dry and intact. No rash noted. Psychiatric: Mood and affect are normal. Speech and behavior are normal.  ____________________________________________   LABS (all labs ordered are listed, but only abnormal results are displayed)  Labs Reviewed  COMPREHENSIVE METABOLIC PANEL - Abnormal; Notable for the following components:      Result Value   Sodium 129 (*)    Potassium 2.3 (*)    Chloride <65 (*)    CO2 43 (*)    Glucose, Bld 181 (*)    BUN 31 (*)    Creatinine, Ser 1.73 (*)    AST 45 (*)    Total Bilirubin 1.9 (*)    GFR calc non Af Amer 39 (*)    GFR calc Af Amer 45 (*)    All other components within normal limits  CBC - Abnormal; Notable for the following components:   WBC 14.9 (*)    RBC 5.92 (*)    Hemoglobin 18.3 (*)    HCT 53.5 (*)    All other components within normal limits  URINALYSIS, COMPLETE (UACMP) WITH MICROSCOPIC - Abnormal; Notable for the following components:   Color, Urine YELLOW (*)    APPearance HAZY (*)    Specific Gravity, Urine >1.046 (*)    Hgb urine dipstick SMALL (*)    Protein, ur 30 (*)    All other components within normal limits  TROPONIN I (HIGH SENSITIVITY) - Abnormal; Notable for the following components:   Troponin  I (High Sensitivity) 37 (*)    All other components within normal limits  TROPONIN I (HIGH SENSITIVITY) - Abnormal; Notable for the following components:   Troponin I (High Sensitivity) 40 (*)    All other components within normal limits  BLOOD GAS, VENOUS - Abnormal; Notable for the following components:   pH, Ven 7.58 (*)    pCO2, Ven 67 (*)    pO2, Ven <31.0 (*)    Bicarbonate 62.8 (*)    Acid-Base Excess 34.7 (*)    All other components within normal limits  MAGNESIUM - Abnormal; Notable for the following components:   Magnesium 2.9 (*)    All other components within normal limits  BRAIN NATRIURETIC PEPTIDE - Abnormal; Notable for the following components:   B Natriuretic Peptide 144.0 (*)    All other components within normal limits  TSH - Abnormal; Notable for the following components:   TSH 0.235 (*)    All other components within normal limits  POTASSIUM - Abnormal; Notable for the following components:   Potassium 2.7 (*)    All other components within normal limits  SARS CORONAVIRUS 2 (HOSPITAL ORDER, [facility])  LIPASE, BLOOD  CBC  BASIC METABOLIC PANEL   ____________________________________________  EKG  ED ECG REPORT I, Kadarrius Yanke, FNP-BC personally viewed and interpreted this ECG.   Date: 06/10/2019  EKG Time: 1443  Rate: 81  Rhythm: normal sinus rhythm  Axis: left  Intervals:Prolonged QTC  ST&T Change: no ST elevation.  ____________________________________________  RADIOLOGY  ED MD interpretation: No acute or inflammatory process in the abdomen or pelvis.  Chest x-ray is negative for acute cardiopulmonary abnormality per radiology.  Official radiology report(s): No results found.  ____________________________________________   PROCEDURES  Procedure(s) performed: None  Procedures  Critical Care performed: No  ____________________________________________   INITIAL IMPRESSION / ASSESSMENT AND PLAN / ED  COURSE  73 year old male presenting to the emergency department for treatment and evaluation of abdominal pain with chest pressure and nausea.  He has been unable to eat anything over the last 4 days.  States that he can tolerate water,  but as soon as he drinks it feels like it is going to come back up.  He denies any vomiting.  Initial labs show hypokalemia, hyponatremia with an elevation in his BUN and creatinine.  Mild leukocytosis at 14.9 without a left shift.  Initial high-sensitivity troponin is noted at 37. ----------------------------------------- 7:12 PM on 06/10/2019 -----------------------------------------  RN staff advised that patient's oxygen saturation dropped to 76%.  4 L of oxygen via nasal cannula brought patient's oxygen saturation up to 93 to 96%.  Patient denies feeling short of breath or having chest pain.  He is not on home oxygen.  Plan will be to admit the patient.  Initial plan will be to send to the [place] if they have a bed.  Patient agrees to this.     ____________________________________________   FINAL CLINICAL IMPRESSION(S) / ED DIAGNOSES  Final diagnoses:  Hypoxia  Hypokalemia  Hyponatremia     ED Discharge Orders    None       Note:  This document was prepared using Dragon voice recognition software and may include unintentional dictation errors.    Victorino Dike, FNP 06/11/19 2025    Merlyn Lot, MD 06/11/19 [phone]

## 2019-06-11 ENCOUNTER — Other Ambulatory Visit: Payer: Self-pay

## 2019-06-11 DIAGNOSIS — Z823 Family history of stroke: Secondary | ICD-10-CM | POA: Diagnosis not present

## 2019-06-11 DIAGNOSIS — Z7982 Long term (current) use of aspirin: Secondary | ICD-10-CM | POA: Diagnosis not present

## 2019-06-11 DIAGNOSIS — J449 Chronic obstructive pulmonary disease, unspecified: Secondary | ICD-10-CM | POA: Diagnosis present

## 2019-06-11 DIAGNOSIS — Z8349 Family history of other endocrine, nutritional and metabolic diseases: Secondary | ICD-10-CM | POA: Diagnosis not present

## 2019-06-11 DIAGNOSIS — G7000 Myasthenia gravis without (acute) exacerbation: Secondary | ICD-10-CM | POA: Diagnosis present

## 2019-06-11 DIAGNOSIS — I130 Hypertensive heart and chronic kidney disease with heart failure and stage 1 through stage 4 chronic kidney disease, or unspecified chronic kidney disease: Secondary | ICD-10-CM | POA: Diagnosis present

## 2019-06-11 DIAGNOSIS — Z841 Family history of disorders of kidney and ureter: Secondary | ICD-10-CM | POA: Diagnosis not present

## 2019-06-11 DIAGNOSIS — Z7951 Long term (current) use of inhaled steroids: Secondary | ICD-10-CM | POA: Diagnosis not present

## 2019-06-11 DIAGNOSIS — E871 Hypo-osmolality and hyponatremia: Secondary | ICD-10-CM | POA: Diagnosis present

## 2019-06-11 DIAGNOSIS — N179 Acute kidney failure, unspecified: Secondary | ICD-10-CM | POA: Diagnosis present

## 2019-06-11 DIAGNOSIS — Z8249 Family history of ischemic heart disease and other diseases of the circulatory system: Secondary | ICD-10-CM | POA: Diagnosis not present

## 2019-06-11 DIAGNOSIS — E860 Dehydration: Secondary | ICD-10-CM | POA: Diagnosis present

## 2019-06-11 DIAGNOSIS — Z8261 Family history of arthritis: Secondary | ICD-10-CM | POA: Diagnosis not present

## 2019-06-11 DIAGNOSIS — E785 Hyperlipidemia, unspecified: Secondary | ICD-10-CM | POA: Diagnosis present

## 2019-06-11 DIAGNOSIS — E876 Hypokalemia: Secondary | ICD-10-CM | POA: Diagnosis present

## 2019-06-11 DIAGNOSIS — I5020 Unspecified systolic (congestive) heart failure: Secondary | ICD-10-CM | POA: Diagnosis present

## 2019-06-11 DIAGNOSIS — Z87891 Personal history of nicotine dependence: Secondary | ICD-10-CM | POA: Diagnosis not present

## 2019-06-11 DIAGNOSIS — Z79891 Long term (current) use of opiate analgesic: Secondary | ICD-10-CM | POA: Diagnosis not present

## 2019-06-11 DIAGNOSIS — Z7952 Long term (current) use of systemic steroids: Secondary | ICD-10-CM | POA: Diagnosis not present

## 2019-06-11 DIAGNOSIS — Z1159 Encounter for screening for other viral diseases: Secondary | ICD-10-CM | POA: Diagnosis not present

## 2019-06-11 DIAGNOSIS — R0902 Hypoxemia: Secondary | ICD-10-CM | POA: Diagnosis present

## 2019-06-11 DIAGNOSIS — N189 Chronic kidney disease, unspecified: Secondary | ICD-10-CM | POA: Diagnosis present

## 2019-06-11 DIAGNOSIS — Z79899 Other long term (current) drug therapy: Secondary | ICD-10-CM | POA: Diagnosis not present

## 2019-06-11 DIAGNOSIS — R109 Unspecified abdominal pain: Secondary | ICD-10-CM | POA: Diagnosis present

## 2019-06-11 DIAGNOSIS — A084 Viral intestinal infection, unspecified: Secondary | ICD-10-CM | POA: Diagnosis present

## 2019-06-11 LAB — TSH: TSH: 0.235 u[IU]/mL — ABNORMAL LOW (ref 0.350–4.500)

## 2019-06-11 LAB — POTASSIUM: Potassium: 2.7 mmol/L — CL (ref 3.5–5.1)

## 2019-06-11 MED ORDER — HYDROCODONE-ACETAMINOPHEN 5-325 MG PO TABS
1.0000 | ORAL_TABLET | Freq: Four times a day (QID) | ORAL | Status: DC | PRN
  Administered 2019-06-11 – 2019-06-12 (×3): 1 via ORAL
  Filled 2019-06-11 (×3): qty 1

## 2019-06-11 MED ORDER — AMLODIPINE BESYLATE 5 MG PO TABS
5.0000 mg | ORAL_TABLET | Freq: Every day | ORAL | Status: DC
  Administered 2019-06-11 – 2019-06-13 (×3): 5 mg via ORAL
  Filled 2019-06-11 (×3): qty 1

## 2019-06-11 MED ORDER — PREDNISONE 20 MG PO TABS
40.0000 mg | ORAL_TABLET | Freq: Every day | ORAL | Status: DC
  Administered 2019-06-11 – 2019-06-13 (×3): 40 mg via ORAL
  Filled 2019-06-11 (×3): qty 2

## 2019-06-11 MED ORDER — ONDANSETRON HCL 4 MG PO TABS: 4.0000 mg | ORAL_TABLET | Freq: Four times a day (QID) | ORAL | Status: DC | PRN

## 2019-06-11 MED ORDER — SODIUM CHLORIDE 0.9% FLUSH
10.0000 mL | Freq: Two times a day (BID) | INTRAVENOUS | Status: DC
  Administered 2019-06-11 (×2): 10 mL

## 2019-06-11 MED ORDER — POTASSIUM CHLORIDE 10 MEQ/100ML IV SOLN
10.0000 meq | INTRAVENOUS | Status: AC
  Administered 2019-06-11 (×3): 10 meq via INTRAVENOUS
  Filled 2019-06-11 (×9): qty 100

## 2019-06-11 MED ORDER — TIOTROPIUM BROMIDE MONOHYDRATE 18 MCG IN CAPS
18.0000 ug | ORAL_CAPSULE | Freq: Every day | RESPIRATORY_TRACT | Status: DC
  Administered 2019-06-11 – 2019-06-13 (×3): 18 ug via RESPIRATORY_TRACT
  Filled 2019-06-11: qty 5

## 2019-06-11 MED ORDER — VITAMIN B-12 1000 MCG PO TABS
1000.0000 ug | ORAL_TABLET | Freq: Every day | ORAL | Status: DC
  Administered 2019-06-11 – 2019-06-13 (×3): 1000 ug via ORAL
  Filled 2019-06-11 (×3): qty 1

## 2019-06-11 MED ORDER — ALBUTEROL SULFATE (2.5 MG/3ML) 0.083% IN NEBU: 2.5000 mg | INHALATION_SOLUTION | RESPIRATORY_TRACT | Status: DC | PRN

## 2019-06-11 MED ORDER — POTASSIUM CHLORIDE IN NACL 40-0.9 MEQ/L-% IV SOLN
INTRAVENOUS | Status: DC
  Administered 2019-06-11 – 2019-06-13 (×4): 75 mL/h via INTRAVENOUS
  Filled 2019-06-11 (×6): qty 1000

## 2019-06-11 MED ORDER — GABAPENTIN 300 MG PO CAPS
600.0000 mg | ORAL_CAPSULE | Freq: Every day | ORAL | Status: DC
  Administered 2019-06-11 – 2019-06-12 (×2): 600 mg via ORAL
  Filled 2019-06-11 (×2): qty 2

## 2019-06-11 MED ORDER — DOCUSATE SODIUM 100 MG PO CAPS
100.0000 mg | ORAL_CAPSULE | Freq: Two times a day (BID) | ORAL | Status: DC
  Administered 2019-06-11 – 2019-06-13 (×5): 100 mg via ORAL
  Filled 2019-06-11 (×5): qty 1

## 2019-06-11 MED ORDER — PRAVASTATIN SODIUM 20 MG PO TABS
20.0000 mg | ORAL_TABLET | Freq: Every day | ORAL | Status: DC
  Administered 2019-06-11 – 2019-06-12 (×2): 20 mg via ORAL
  Filled 2019-06-11 (×2): qty 1

## 2019-06-11 MED ORDER — POTASSIUM CHLORIDE 10 MEQ/100ML IV SOLN
10.0000 meq | INTRAVENOUS | Status: AC
  Administered 2019-06-11 (×2): 10 meq via INTRAVENOUS
  Filled 2019-06-11: qty 100

## 2019-06-11 MED ORDER — PRAMIPEXOLE DIHYDROCHLORIDE 0.25 MG PO TABS
0.5000 mg | ORAL_TABLET | Freq: Every day | ORAL | Status: DC
  Administered 2019-06-11 – 2019-06-12 (×2): 0.5 mg via ORAL
  Filled 2019-06-11 (×2): qty 2

## 2019-06-11 MED ORDER — ONDANSETRON HCL 4 MG/2ML IJ SOLN
4.0000 mg | Freq: Once | INTRAMUSCULAR | Status: AC
  Administered 2019-06-11: 4 mg via INTRAVENOUS
  Filled 2019-06-11: qty 2

## 2019-06-11 MED ORDER — PROCHLORPERAZINE EDISYLATE 10 MG/2ML IJ SOLN
10.0000 mg | Freq: Four times a day (QID) | INTRAMUSCULAR | Status: DC | PRN
  Filled 2019-06-11: qty 2

## 2019-06-11 MED ORDER — HEPARIN SODIUM (PORCINE) 5000 UNIT/ML IJ SOLN
5000.0000 [IU] | Freq: Three times a day (TID) | INTRAMUSCULAR | Status: DC
  Administered 2019-06-11 – 2019-06-12 (×5): 5000 [IU] via SUBCUTANEOUS
  Filled 2019-06-11 (×5): qty 1

## 2019-06-11 MED ORDER — ASPIRIN EC 81 MG PO TBEC
81.0000 mg | DELAYED_RELEASE_TABLET | Freq: Every day | ORAL | Status: DC
  Administered 2019-06-11 – 2019-06-13 (×3): 81 mg via ORAL
  Filled 2019-06-11 (×3): qty 1

## 2019-06-11 MED ORDER — ONDANSETRON HCL 4 MG/2ML IJ SOLN: 4.0000 mg | Freq: Four times a day (QID) | INTRAMUSCULAR | Status: DC | PRN

## 2019-06-11 MED ORDER — ACETAMINOPHEN 325 MG PO TABS: 650.0000 mg | ORAL_TABLET | Freq: Four times a day (QID) | ORAL | Status: DC | PRN

## 2019-06-11 MED ORDER — PYRIDOSTIGMINE BROMIDE 60 MG PO TABS
60.0000 mg | ORAL_TABLET | Freq: Four times a day (QID) | ORAL | Status: DC
  Administered 2019-06-11 – 2019-06-13 (×9): 60 mg via ORAL
  Filled 2019-06-11 (×12): qty 1

## 2019-06-11 MED ORDER — TORSEMIDE 20 MG PO TABS
40.0000 mg | ORAL_TABLET | ORAL | Status: DC
  Administered 2019-06-12: 40 mg via ORAL
  Filled 2019-06-11: qty 2

## 2019-06-11 MED ORDER — SODIUM CHLORIDE 0.9% FLUSH: 10.0000 mL | INTRAVENOUS | Status: DC | PRN

## 2019-06-11 MED ORDER — TORSEMIDE 20 MG PO TABS
20.0000 mg | ORAL_TABLET | ORAL | Status: DC
  Administered 2019-06-11 – 2019-06-13 (×2): 20 mg via ORAL
  Filled 2019-06-11 (×2): qty 1

## 2019-06-11 MED ORDER — ACETAMINOPHEN 650 MG RE SUPP: 650.0000 mg | Freq: Four times a day (QID) | RECTAL | Status: DC | PRN

## 2019-06-11 MED ORDER — PANTOPRAZOLE SODIUM 40 MG PO TBEC
80.0000 mg | DELAYED_RELEASE_TABLET | Freq: Every day | ORAL | Status: DC
  Administered 2019-06-11 – 2019-06-13 (×3): 80 mg via ORAL
  Filled 2019-06-11 (×3): qty 2

## 2019-06-11 NOTE — TOC Progression Note (Signed)
Transition of Care [facility]) - Progression Note    Patient Details  Name: Joseph Macdonald MRN: [id number] Date of Birth: 1946/11/05  [facility] CM/SW Contact  Shela Leff, [place] Phone Number: 06/11/2019, 2:41 PM  Clinical Narrative:   CSW has spoken to [place] at the [place]: [phone] ext: [phone]. Hassan Rowan states that since the physician has stated that patient may discharge tomorrow, that there is no need to begin paperwork for transfer to [place]. She stated if patient does not discharge tomorrow then she will have CSW begin paperwork tomorrow.          Expected Discharge Plan and Services                                                 Social Determinants of Health (SDOH) Interventions    Readmission Risk Interventions No flowsheet data found.

## 2019-06-11 NOTE — ED Notes (Signed)
ED TO INPATIENT HANDOFF REPORT  ED Nurse Name and Phone #: [phone]  S Name/Age/Gender Joseph Macdonald 73 y.o. male Room/Bed: ED15A/ED15A  Code Status   Code Status: Not on file  Home/SNF/Other Home Patient oriented to: self, place, time and situation Is this baseline? Yes   Triage Complete: Triage complete  Chief Complaint chest pressure;abd pain;nausea  Triage Note Pt to ER states c/o abdominal pain, chest pressure and severe nausea.  Pt states started 4 days ago, got better and then got worse again.  Pt denies vomiting, denies cough.     Allergies No Known Allergies  Level of Care/Admitting Diagnosis ED Disposition    ED Disposition Condition [facility] Area: [place] [[id number]]  Level of Care: Med-Surg [16]  Covid Evaluation: Confirmed COVID Negative  Diagnosis: Acute on chronic kidney failure [facility]) [[id number]]  Admitting Physician: Harrie Foreman [[id number]]  Attending Physician: Harrie Foreman [[id number]]  Estimated length of stay: past midnight tomorrow  Certification:: I certify this patient will need inpatient services for at least 2 midnights  PT Class (Do Not Modify): Inpatient [101]  PT Acc Code (Do Not Modify): Private [1]       B Medical/Surgery History Past Medical History:  Diagnosis Date  . Hyperlipidemia   . Hypertension   . Myasthenia gravis ([place])   . Ventricular tachycardia [facility])    Past Surgical History:  Procedure Laterality Date  . carpel tunnel sx    . CATARACT EXTRACTION     right eye  . SKIN GRAFT     Behind left knee  . TONSILLECTOMY AND ADENOIDECTOMY       A IV Location/Drains/Wounds Patient Lines/Drains/Airways Status   Active Line/Drains/Airways    Name:   Placement date:   Placement time:   Site:   Days:   Peripheral IV 06/10/19 Right Antecubital   06/10/19    1714    Antecubital   1          Intake/Output Last 24 hours  Intake/Output Summary (Last 24 hours) at 06/11/2019  5427 Last data filed at 06/11/2019 0130 Gross per 24 hour  Intake 945.99 ml  Output -  Net 945.99 ml    Labs/Imaging Results for orders placed or performed during the hospital encounter of 06/10/19 (from the past 48 hour(s))  Lipase, blood     Status: None   Collection Time: 06/10/19  3:03 PM  Result Value Ref Range   Lipase 30 11 - 51 U/L    Comment: Performed at [facility], [address] [place], [place] [address]  Comprehensive metabolic panel     Status: Abnormal   Collection Time: 06/10/19  3:03 PM  Result Value Ref Range   Sodium 129 (L) 135 - 145 mmol/L   Potassium 2.3 (LL) 3.5 - 5.1 mmol/L    Comment: CRITICAL RESULT CALLED TO, READ BACK BY AND VERIFIED WITH SHANNON MARTIN 06/10/19 1657 KLW    Chloride <65 (LL) 98 - 111 mmol/L    Comment: CRITICAL RESULT CALLED TO, READ BACK BY AND VERIFIED WITH SHANNON MARTIN 06/10/19 1657 KLW    CO2 43 (H) 22 - 32 mmol/L   Glucose, Bld 181 (H) 70 - 99 mg/dL   BUN 31 (H) 8 - 23 mg/dL   Creatinine, Ser 1.73 (H) 0.61 - 1.24 mg/dL   Calcium 9.1 8.9 - 10.3 mg/dL   Total Protein 7.6 6.5 - 8.1 g/dL   Albumin 4.4 3.5 - 5.0 g/dL  AST 45 (H) 15 - 41 U/L   ALT 37 0 - 44 U/L   Alkaline Phosphatase 47 38 - 126 U/L   Total Bilirubin 1.9 (H) 0.3 - 1.2 mg/dL   GFR calc non Af Amer 39 (L) >60 mL/min   GFR calc Af Amer 45 (L) >60 mL/min    Comment: Performed at [facility], [address] [place], [place] [address]  CBC     Status: Abnormal   Collection Time: 06/10/19  3:03 PM  Result Value Ref Range   WBC 14.9 (H) 4.0 - 10.5 K/uL   RBC 5.92 (H) 4.22 - 5.81 MIL/uL   Hemoglobin 18.3 (H) 13.0 - 17.0 g/dL   HCT 53.5 (H) 39.0 - 52.0 %   MCV 90.4 80.0 - 100.0 fL   MCH 30.9 26.0 - 34.0 pg   MCHC 34.2 30.0 - 36.0 g/dL   RDW 13.4 11.5 - 15.5 %   Platelets 194 150 - 400 K/uL   nRBC 0.0 0.0 - 0.2 %    Comment: Performed at [facility], [place], [place] [address]  Troponin I (High Sensitivity)      Status: Abnormal   Collection Time: 06/10/19  3:03 PM  Result Value Ref Range   Troponin I (High Sensitivity) 37 (H) <18 ng/L    Comment: (NOTE) Elevated high sensitivity troponin I (hsTnI) values and significant  changes across serial measurements may suggest ACS but many other  chronic and acute conditions are known to elevate hsTnI results.  Refer to the Links section for chest pain algorithms and additional  guidance. Performed at [facility], [address] [place], [place] [address]   Urinalysis, Complete w Microscopic     Status: Abnormal   Collection Time: 06/10/19  5:15 PM  Result Value Ref Range   Color, Urine YELLOW (A) YELLOW   APPearance HAZY (A) CLEAR   Specific Gravity, Urine >1.046 (H) 1.005 - 1.030   pH 7.0 5.0 - 8.0   Glucose, UA NEGATIVE NEGATIVE mg/dL   Hgb urine dipstick SMALL (A) NEGATIVE   Bilirubin Urine NEGATIVE NEGATIVE   Ketones, ur NEGATIVE NEGATIVE mg/dL   Protein, ur 30 (A) NEGATIVE mg/dL   Nitrite NEGATIVE NEGATIVE   Leukocytes,Ua NEGATIVE NEGATIVE   RBC / HPF 6-10 0 - 5 RBC/hpf   WBC, UA 0-5 0 - 5 WBC/hpf   Bacteria, UA NONE SEEN NONE SEEN   Squamous Epithelial / LPF 0-5 0 - 5    Comment: Performed at [facility], [place], [place] [address]  Troponin I (High Sensitivity)     Status: Abnormal   Collection Time: 06/10/19  5:15 PM  Result Value Ref Range   Troponin I (High Sensitivity) 40 (H) <18 ng/L    Comment: (NOTE) Elevated high sensitivity troponin I (hsTnI) values and significant  changes across serial measurements may suggest ACS but many other  chronic and acute conditions are known to elevate hsTnI results.  Refer to the "Links" section for chest pain algorithms and additional  guidance. Performed at [facility], [address], [place], [place] [address]   SARS Coronavirus 2 (CEPHEID- Performed in [facility] hospital lab), Hosp Order     Status: None   Collection Time: 06/10/19   5:39 PM   Specimen: Nasopharyngeal Swab  Result Value Ref Range   SARS Coronavirus 2 NEGATIVE NEGATIVE    Comment: (NOTE) If result is NEGATIVE SARS-CoV-2 target nucleic acids are NOT  DETECTED. The SARS-CoV-2 RNA is generally detectable in upper and lower  respiratory specimens during the acute phase of infection. The lowest  concentration of SARS-CoV-2 viral copies this assay can detect is 250  copies / mL. A negative result does not preclude SARS-CoV-2 infection  and should not be used as the sole basis for treatment or other  patient management decisions.  A negative result may occur with  improper specimen collection / handling, submission of specimen other  than nasopharyngeal swab, presence of viral mutation(s) within the  areas targeted by this assay, and inadequate number of viral copies  (<250 copies / mL). A negative result must be combined with clinical  observations, patient history, and epidemiological information. If result is POSITIVE SARS-CoV-2 target nucleic acids are DETECTED. The SARS-CoV-2 RNA is generally detectable in upper and lower  respiratory specimens dur ing the acute phase of infection.  Positive  results are indicative of active infection with SARS-CoV-2.  Clinical  correlation with patient history and other diagnostic information is  necessary to determine patient infection status.  Positive results do  not rule out bacterial infection or co-infection with other viruses. If result is PRESUMPTIVE POSTIVE SARS-CoV-2 nucleic acids MAY BE PRESENT.   A presumptive positive result was obtained on the submitted specimen  and confirmed on repeat testing.  While 2019 novel coronavirus  (SARS-CoV-2) nucleic acids may be present in the submitted sample  additional confirmatory testing may be necessary for epidemiological  and / or clinical management purposes  to differentiate between  SARS-CoV-2 and other Sarbecovirus currently known to infect humans.  If  clinically indicated additional testing with an alternate test  methodology [phone]) is advised. The SARS-CoV-2 RNA is generally  detectable in upper and lower respiratory sp ecimens during the acute  phase of infection. The expected result is Negative. Fact Sheet for Patients:  StrictlyIdeas.no Fact Sheet for Healthcare Providers: BankingDealers.co.za This test is not yet approved or cleared by the [place] FDA and has been authorized for detection and/or diagnosis of SARS-CoV-2 by FDA under an Emergency Use Authorization (EUA).  This EUA will remain in effect (meaning this test can be used) for the duration of the COVID-19 declaration under Section 564(b)(1) of the Act, 21 U.S.C. section 360bbb-3(b)(1), unless the authorization is terminated or revoked sooner. Performed at [facility], [address] [place], [place] [address]   Magnesium     Status: Abnormal   Collection Time: 06/10/19  7:17 PM  Result Value Ref Range   Magnesium 2.9 (H) 1.7 - 2.4 mg/dL    Comment: Performed at [facility], [address] [place], [place] [address]  Brain natriuretic peptide     Status: Abnormal   Collection Time: 06/10/19  7:30 PM  Result Value Ref Range   B Natriuretic Peptide 144.0 (H) 0.0 - 100.0 pg/mL    Comment: Performed at [facility], [address] [place], [place] [address]  Blood gas, venous     Status: Abnormal   Collection Time: 06/10/19  7:40 PM  Result Value Ref Range   pH, Ven 7.58 (H) 7.250 - 7.430   pCO2, Ven 67 (H) 44.0 - 60.0 mmHg   pO2, Ven <31.0 (LL) 32.0 - 45.0 mmHg   Bicarbonate 62.8 (H) 20.0 - 28.0 mmol/L   Acid-Base Excess 34.7 (H) 0.0 - 2.0 mmol/L   O2 Saturation 55.9 %   Patient temperature 37.0    Collection site VEIN    Sample type VENOUS  Comment: Performed at [facility], [address] [place], [place] [address]   Ct Abdomen Pelvis W Contrast  Result  Date: 06/10/2019 CLINICAL DATA:  73 year old male with abdominal pain and nausea. EXAM: CT ABDOMEN AND PELVIS WITH CONTRAST TECHNIQUE: Multidetector CT imaging of the abdomen and pelvis was performed using the standard protocol following bolus administration of intravenous contrast. CONTRAST:  25mL OMNIPAQUE IOHEXOL 300 MG/ML  SOLN COMPARISON:  Abdomen MRI 10/20/2015. FINDINGS: Lower chest: Mediastinal lipomatosis. Mild scarring in the left lower lobe. No pericardial or pleural effusion. Hepatobiliary: Hepatic steatosis. Otherwise negative liver and gallbladder. Pancreas: Fatty atrophied but otherwise negative. Spleen: Negative; punctate calcified granuloma. Adrenals/Urinary Tract: Normal adrenal glands. Bilateral renal enhancement and contrast excretion is symmetric and normal. Decompressed renal collecting systems and proximal ureters. No nephrolithiasis. Negative ureters. Unremarkable urinary bladder. Stomach/Bowel: Negative descending and rectosigmoid colon. Negative transverse colon. Negative right colon and appendix (coronal image 61). Negative terminal ileum. No dilated small bowel. Negative stomach. No free air, free fluid, mesenteric stranding. Vascular/Lymphatic: Aortoiliac calcified atherosclerosis. Tortuous descending thoracic aorta. Major arterial structures are patent. Portal venous system is patent. No lymphadenopathy. Reproductive: Negative. Other: No pelvic free fluid. Musculoskeletal: Advanced disc and endplate degeneration in much of the lumbar spine. Grade 1 anterolisthesis at L4-L5 with multifactorial spinal stenosis. No acute osseous abnormality identified. IMPRESSION: 1. No acute or inflammatory process identified in the abdomen or pelvis. Normal appendix. 2. Hepatic steatosis. 3. 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 4.  Aortic Atherosclerosis (ICD10-I70.0). Electronically Signed   By: Genevie Ann M.D.   On: 06/10/2019 18:42   Dg Chest Portable 1 View  Result  Date: 06/10/2019 CLINICAL DATA:  Chest pressure, abdominal pain, and nausea. EXAM: PORTABLE CHEST 1 VIEW COMPARISON:  05/30/2019 FINDINGS: The cardiac silhouette remains mildly enlarged. Aortic atherosclerosis is noted. Scarring is again seen in the left lung base. No acute airspace consolidation, overt pulmonary edema, sizable pleural effusion, pneumothorax is identified. No acute osseous abnormality is seen. IMPRESSION: No evidence of acute cardiopulmonary process. Electronically Signed   By: Logan Bores M.D.   On: 06/10/2019 18:30    Pending Labs [employer] (From admission, onward)    Start     Ordered   Signed and Held  TSH  Add-on,   R     Signed and Held          Vitals/Pain Today's Vitals   06/11/19 0004 06/11/19 0130 06/11/19 0200 06/11/19 0230  BP: (!) 144/75 (!) 152/79 (!) 143/79 139/90  Pulse: 73 78 72 82  Resp: 16 18 13 15   Temp:      SpO2: 97% 93% 95% 95%  Weight:      Height:      PainSc:        Isolation Precautions Airborne and Contact precautions  Medications Medications  potassium chloride 10 mEq in 100 mL IVPB (0 mEq Intravenous Stopped 06/11/19 0130)  sodium chloride flush (NS) 0.9 % injection 3 mL (3 mLs Intravenous Given 06/10/19 1725)  sodium chloride 0.9 % bolus 500 mL (0 mLs Intravenous Stopped 06/10/19 1931)  potassium chloride 10 mEq in 100 mL IVPB (0 mEq Intravenous Stopped 06/10/19 1931)  iohexol (OMNIPAQUE) 300 MG/ML solution 75 mL (75 mLs Intravenous Contrast Given 06/10/19 1814)  magnesium sulfate IVPB 2 g 50 mL (0 g Intravenous Stopped 06/10/19 2046)  albuterol (PROVENTIL) (2.5 MG/3ML) 0.083% nebulizer solution 2.5 mg (2.5 mg Nebulization Given 06/10/19 2022)  ipratropium-albuterol (DUONEB) 0.5-2.5 (3) MG/3ML nebulizer solution  3 mL (3 mLs Nebulization Given 06/11/19 0032)  ipratropium-albuterol (DUONEB) 0.5-2.5 (3) MG/3ML nebulizer solution 3 mL (3 mLs Nebulization Given 06/11/19 0032)  methylPREDNISolone sodium succinate (SOLU-MEDROL) 125 mg/2 mL  injection 125 mg (125 mg Intravenous Given 06/11/19 0026)  0.9 %  sodium chloride infusion ( Intravenous New Bag/Given 06/11/19 0029)  ondansetron (ZOFRAN) injection 4 mg (4 mg Intravenous Given 06/11/19 0026)    Mobility walks Low fall risk   Focused Assessments Renal Assessment Handoff: Hemodialysis Schedule:  Last Hemodialysis date and time:    Restricted appendage:   ,    R Recommendations: See Admitting Provider Note  Report given to:   Additional Notes:

## 2019-06-11 NOTE — Progress Notes (Addendum)
CRITICAL VALUE ALERT  Critical Value:  2.7  Date & Time Notied:  06/11/19 12:05 pm  Provider Notified: Dr. Dustin Flock notified   Orders Received/Actions pending orders   Fuller Mandril, RN

## 2019-06-11 NOTE — Progress Notes (Signed)
[place] at [facility]                                                                                                                                                                                  Patient Demographics   Joseph Macdonald, is a 73 y.o. male, DOB - September 14, 1946, WPY:[id number]  Admit date - 06/10/2019   Admitting Physician Harrie Foreman, MD  Outpatient Primary MD for the patient is [name], MD   LOS - 0  Subjective: Patient feeling better admitted with nausea vomiting diarrhea noted to have severe hypokalemia acute kidney injury    Review of Systems:   CONSTITUTIONAL: No documented fever. No fatigue, positive weakness. No weight gain, no weight loss.  EYES: No blurry or double vision.  ENT: No tinnitus. No postnasal drip. No redness of the oropharynx.  RESPIRATORY: No cough, no wheeze, no hemoptysis. No dyspnea.  CARDIOVASCULAR: No chest pain. No orthopnea. No palpitations. No syncope.  GASTROINTESTINAL: No nausea, no vomiting or diarrhea. No abdominal pain. No melena or hematochezia.  GENITOURINARY: No dysuria or hematuria.  ENDOCRINE: No polyuria or nocturia. No heat or cold intolerance.  HEMATOLOGY: No anemia. No bruising. No bleeding.  INTEGUMENTARY: No rashes. No lesions.  MUSCULOSKELETAL: No arthritis. No swelling. No gout.  NEUROLOGIC: No numbness, tingling, or ataxia. No seizure-type activity.  PSYCHIATRIC: No anxiety. No insomnia. No ADD.    Vitals:   Vitals:   06/11/19 0347 06/11/19 0424 06/11/19 0837 06/11/19 1143  BP: (!) 152/78  (!) 146/65 (!) 146/77  Pulse: 78  78 72  Resp: 20     Temp: 98.5 F (36.9 C)   97.7 F (36.5 C)  TempSrc: Oral     SpO2: 100%  97% 96%  Weight:  102.5 kg    Height:  5\' 7"  (1.702 m)      Wt Readings from Last 3 Encounters:  06/11/19 102.5 kg  05/30/19 109.8 kg  05/13/19 105.1 kg     Intake/Output Summary (Last 24 hours) at 06/11/2019 1334 Last data filed at 06/11/2019  1325 Gross per 24 hour  Intake 1590.13 ml  Output 1275 ml  Net 315.13 ml    Physical Exam:   GENERAL: Pleasant-appearing in no apparent distress.  HEAD, EYES, EARS, NOSE AND THROAT: Atraumatic, normocephalic. Extraocular muscles are intact. Pupils equal and reactive to light. Sclerae anicteric. No conjunctival injection. No oro-pharyngeal erythema.  NECK: Supple. There is no jugular venous distention. No bruits, no lymphadenopathy, no thyromegaly.  HEART: Regular rate and rhythm,. No murmurs, no rubs, no clicks.  LUNGS: Clear to auscultation bilaterally. No rales or rhonchi. No wheezes.  ABDOMEN: Soft, flat, nontender,  nondistended. Has good bowel sounds. No hepatosplenomegaly appreciated.  EXTREMITIES: No evidence of any cyanosis, clubbing, or peripheral edema.  +2 pedal and radial pulses bilaterally.  NEUROLOGIC: The patient is alert, awake, and oriented x3 with no focal motor or sensory deficits appreciated bilaterally.  SKIN: Moist and warm with no rashes appreciated.  Psych: Not anxious, depressed LN: No inguinal LN enlargement    Antibiotics   Anti-infectives (From admission, onward)   None      Medications   Scheduled Meds: . amLODipine  5 mg Oral Daily  . aspirin EC  81 mg Oral Daily  . docusate sodium  100 mg Oral BID  . gabapentin  600 mg Oral QHS  . heparin  5,000 Units Subcutaneous Q8H  . pantoprazole  80 mg Oral Daily  . pramipexole  0.5 mg Oral QHS  . pravastatin  20 mg Oral Daily  . predniSONE  40 mg Oral Q breakfast  . pyridostigmine  60 mg Oral QID  . tiotropium  18 mcg Inhalation Daily  . torsemide  20 mg Oral QODAY  . [START ON 06/12/2019] torsemide  40 mg Oral QODAY  . vitamin B-12  1,000 mcg Oral Daily   Continuous Infusions: . 0.9 % NaCl with KCl 40 mEq / L 75 mL/hr at 06/11/19 0800  . potassium chloride 10 mEq (06/11/19 1325)   PRN Meds:.acetaminophen **OR** acetaminophen, albuterol, HYDROcodone-acetaminophen, ondansetron **OR** ondansetron  (ZOFRAN) IV, prochlorperazine   Data Review:   Micro Results Recent Results (from the past 240 hour(s))  SARS Coronavirus 2 (CEPHEID- Performed in [place] hospital lab), Hosp Order     Status: None   Collection Time: 06/10/19  5:39 PM   Specimen: Nasopharyngeal Swab  Result Value Ref Range Status   SARS Coronavirus 2 NEGATIVE NEGATIVE Final    Comment: (NOTE) If result is NEGATIVE SARS-CoV-2 target nucleic acids are NOT DETECTED. The SARS-CoV-2 RNA is generally detectable in upper and lower  respiratory specimens during the acute phase of infection. The lowest  concentration of SARS-CoV-2 viral copies this assay can detect is 250  copies / mL. A negative result does not preclude SARS-CoV-2 infection  and should not be used as the sole basis for treatment or other  patient management decisions.  A negative result may occur with  improper specimen collection / handling, submission of specimen other  than nasopharyngeal swab, presence of viral mutation(s) within the  areas targeted by this assay, and inadequate number of viral copies  (<250 copies / mL). A negative result must be combined with clinical  observations, patient history, and epidemiological information. If result is POSITIVE SARS-CoV-2 target nucleic acids are DETECTED. The SARS-CoV-2 RNA is generally detectable in upper and lower  respiratory specimens dur ing the acute phase of infection.  Positive  results are indicative of active infection with SARS-CoV-2.  Clinical  correlation with patient history and other diagnostic information is  necessary to determine patient infection status.  Positive results do  not rule out bacterial infection or co-infection with other viruses. If result is PRESUMPTIVE POSTIVE SARS-CoV-2 nucleic acids MAY BE PRESENT.   A presumptive positive result was obtained on the submitted specimen  and confirmed on repeat testing.  While 2019 novel coronavirus  (SARS-CoV-2) nucleic acids may  be present in the submitted sample  additional confirmatory testing may be necessary for epidemiological  and / or clinical management purposes  to differentiate between  SARS-CoV-2 and other Sarbecovirus currently known to infect humans.  If clinically  indicated additional testing with an alternate test  methodology [phone]) is advised. The SARS-CoV-2 RNA is generally  detectable in upper and lower respiratory sp ecimens during the acute  phase of infection. The expected result is Negative. Fact Sheet for Patients:  StrictlyIdeas.no Fact Sheet for Healthcare Providers: BankingDealers.co.za This test is not yet approved or cleared by the [place] FDA and has been authorized for detection and/or diagnosis of SARS-CoV-2 by FDA under an Emergency Use Authorization (EUA).  This EUA will remain in effect (meaning this test can be used) for the duration of the COVID-19 declaration under Section 564(b)(1) of the Act, 21 U.S.C. section 360bbb-3(b)(1), unless the authorization is terminated or revoked sooner. Performed at [facility], [address] [place], [place] [address]     Radiology Reports Dg Chest 2 View  Result Date: 05/30/2019 CLINICAL DATA:  Cough with sinus drainage, shortness of breath. History of COPD/CHF. EXAM: CHEST - 2 VIEW COMPARISON:  None. FINDINGS: The heart is enlarged. There is mild aortic atherosclerosis. Linear opacities at both lung bases are most consistent with atelectasis or scarring. There is prominent extrapleural fat bilaterally, but no evidence pleural effusion. There is no confluent airspace opacity, edema or pneumothorax. Mild degenerative changes are present within the thoracic spine. IMPRESSION: Cardiomegaly with mild bibasilar atelectasis or scarring. No edema or acute findings. Electronically Signed   By: Richardean Sale M.D.   On: 05/30/2019 15:02   Ct Abdomen Pelvis W Contrast  Result  Date: 06/10/2019 CLINICAL DATA:  73 year old male with abdominal pain and nausea. EXAM: CT ABDOMEN AND PELVIS WITH CONTRAST TECHNIQUE: Multidetector CT imaging of the abdomen and pelvis was performed using the standard protocol following bolus administration of intravenous contrast. CONTRAST:  49mL OMNIPAQUE IOHEXOL 300 MG/ML  SOLN COMPARISON:  Abdomen MRI 10/20/2015. FINDINGS: Lower chest: Mediastinal lipomatosis. Mild scarring in the left lower lobe. No pericardial or pleural effusion. Hepatobiliary: Hepatic steatosis. Otherwise negative liver and gallbladder. Pancreas: Fatty atrophied but otherwise negative. Spleen: Negative; punctate calcified granuloma. Adrenals/Urinary Tract: Normal adrenal glands. Bilateral renal enhancement and contrast excretion is symmetric and normal. Decompressed renal collecting systems and proximal ureters. No nephrolithiasis. Negative ureters. Unremarkable urinary bladder. Stomach/Bowel: Negative descending and rectosigmoid colon. Negative transverse colon. Negative right colon and appendix (coronal image 61). Negative terminal ileum. No dilated small bowel. Negative stomach. No free air, free fluid, mesenteric stranding. Vascular/Lymphatic: Aortoiliac calcified atherosclerosis. Tortuous descending thoracic aorta. Major arterial structures are patent. Portal venous system is patent. No lymphadenopathy. Reproductive: Negative. Other: No pelvic free fluid. Musculoskeletal: Advanced disc and endplate degeneration in much of the lumbar spine. Grade 1 anterolisthesis at L4-L5 with multifactorial spinal stenosis. No acute osseous abnormality identified. IMPRESSION: 1. No acute or inflammatory process identified in the abdomen or pelvis. Normal appendix. 2. Hepatic steatosis. 3. 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 4.  Aortic Atherosclerosis (ICD10-I70.0). Electronically Signed   By: Genevie Ann M.D.   On: 06/10/2019 18:42   Dg Chest Portable 1 View  Result  Date: 06/10/2019 CLINICAL DATA:  Chest pressure, abdominal pain, and nausea. EXAM: PORTABLE CHEST 1 VIEW COMPARISON:  05/30/2019 FINDINGS: The cardiac silhouette remains mildly enlarged. Aortic atherosclerosis is noted. Scarring is again seen in the left lung base. No acute airspace consolidation, overt pulmonary edema, sizable pleural effusion, pneumothorax is identified. No acute osseous abnormality is seen. IMPRESSION: No evidence of acute cardiopulmonary process. Electronically Signed   By: Logan Bores M.D.   On: 06/10/2019 18:30  CBC Recent Labs  Lab 06/10/19 1503  WBC 14.9*  HGB 18.3*  HCT 53.5*  PLT 194  MCV 90.4  MCH 30.9  MCHC 34.2  RDW 13.4    Chemistries  Recent Labs  Lab 06/10/19 1503 06/10/19 1917 06/11/19 1131  NA 129*  --   --   K 2.3*  --  2.7*  CL <65*  --   --   CO2 43*  --   --   GLUCOSE 181*  --   --   BUN 31*  --   --   CREATININE 1.73*  --   --   CALCIUM 9.1  --   --   MG  --  2.9*  --   AST 45*  --   --   ALT 37  --   --   ALKPHOS 47  --   --   BILITOT 1.9*  --   --    ------------------------------------------------------------------------------------------------------------------ estimated creatinine clearance is 44.1 mL/min (A) (by C-G formula based on SCr of 1.73 mg/dL (H)). ------------------------------------------------------------------------------------------------------------------ No results for input(s): HGBA1C in the last 72 hours. ------------------------------------------------------------------------------------------------------------------ No results for input(s): CHOL, HDL, LDLCALC, TRIG, CHOLHDL, LDLDIRECT in the last 72 hours. ------------------------------------------------------------------------------------------------------------------ Recent Labs    06/10/19 1715  TSH 0.235*   ------------------------------------------------------------------------------------------------------------------ No results for input(s):  VITAMINB12, FOLATE, FERRITIN, TIBC, IRON, RETICCTPCT in the last 72 hours.  Coagulation profile No results for input(s): INR, PROTIME in the last 168 hours.  No results for input(s): DDIMER in the last 72 hours.  Cardiac Enzymes No results for input(s): CKMB, TROPONINI, MYOGLOBIN in the last 168 hours.  Invalid input(s): CK ------------------------------------------------------------------------------------------------------------------ Invalid input(s): [place]   This is a 73 year old male admitted for acute kidney injury. 1.  AKI: Superimposed upon chronic kidney disease.  Prerenal secondary to dehydration from diarrhea/vomiting as well as continued diuretic use.  Continue IV fluids   2.  Severe hypokalemia we will give IV potassium follow his potassium   3  Diarrhea: Likely viral.   Now resolved Follow CBC discontinue IV Zosyn   4.  CHF: Systolic; nonischemic.    Follow cardiac status due to acute kidney injury with holding Lasix  5.  Hypertension: Intermittently controlled.  Continue to monitor telemetry.  Continue amlodipine.  Labetalol as needed.  6.  Myasthenia gravis: Stable; continue pyridostigmine and prednisone per home regimen  7.  COPD: Contributing to shortness of breath.  Supplemental oxygen as needed.  Continue Spiriva  8  Hyperlipidemia: Continue statin therapy  9.  DVT prophylaxis: Heparin  10  GI prophylaxis: Pantoprazole per home regimen     Code Status Orders  (From admission, onward)         Start     Ordered   06/11/19 0332  Full code  Continuous     06/11/19 0331        Code Status History    This patient has a current code status but no historical code status.   Advance Care Planning Activity           Consults  none  DVT Prophylaxis  heparin  Lab Results  Component Value Date   PLT 194 06/10/2019     Time Spent in minutes 54min Greater than 50% of time spent in care coordination and counseling  patient regarding the condition and plan of care.   Dustin Flock M.D on 06/11/2019 at 1:34 PM  Between 7am to 6pm - Pager - [phone]  After 6pm go to [email or web address] - Proofreader  [employer]   Office  [phone]

## 2019-06-11 NOTE — H&P (Signed)
Joseph Macdonald is an 73 y.o. male.   Chief Complaint: Abdominal pain HPI: The patient with past medical history of myasthenia gravis, hypertension, CHF and COPD presents to the emergency department complaining of abdominal pain.  The patient has had several days of nausea, vomiting and diarrhea.  Emesis and stool has been nonbloody.  He denies fevers.  Notably, the patient also admits to chest pain.  However when questioned more deeply the patient admits that his chest pain comes from breathing heavily.  Chest x-ray showed increased cephalization of vessels and increased lung volumes but no pulmonary edema.  However, the patient admits that his daily diuretic dose has been increased by his primary care doctors due to recurrent and progressive lower extremity edema.  Laboratory evaluation today shows acute kidney injury.  Once the patient was stabilized emergency department staff, hospitalist service for admission.  Past Medical History:  Diagnosis Date  . Hyperlipidemia   . Hypertension   . Myasthenia gravis ([place])   . Ventricular tachycardia [facility])     Past Surgical History:  Procedure Laterality Date  . carpel tunnel sx    . CATARACT EXTRACTION     right eye  . SKIN GRAFT     Behind left knee  . TONSILLECTOMY AND ADENOIDECTOMY      Family History  Problem Relation Age of Onset  . Hypertension Mother   . Thyroid disease Mother   . Kidney disease Mother   . Arthritis Mother   . Stroke Father   . Heart disease Father 13  . Arthritis Father   . Heart attack Father   . Arthritis Other    Social History:  reports that he quit smoking about 21 months ago. His smoking use included pipe and cigarettes. He has a 20.00 pack-year smoking history. He has quit using smokeless tobacco. He reports current alcohol use. He reports that he does not use drugs.  Allergies: No Known Allergies  Medications Prior to Admission  Medication Sig Dispense Refill  . albuterol (PROVENTIL HFA;VENTOLIN  HFA) 108 (90 Base) MCG/ACT inhaler Inhale into the lungs every 6 (six) hours as needed for wheezing or shortness of breath.    Marland Kitchen amLODipine (NORVASC) 5 MG tablet Take 1 tablet (5 mg total) by mouth daily. 90 tablet 3  . aspirin EC 81 MG tablet Take 81 mg by mouth daily.    . famotidine (PEPCID) 20 MG tablet Take 20 mg by mouth as needed for heartburn or indigestion.    . gabapentin (NEURONTIN) 300 MG capsule Take 600 mg by mouth at bedtime.    Marland Kitchen HYDROcodone-acetaminophen (NORCO/VICODIN) 5-325 MG tablet Take 1 tablet by mouth every 6 (six) hours as needed for moderate pain. 120 tablet 0  . metolazone (ZAROXOLYN) 5 MG tablet Take 1 tablet (5 mg total) by mouth daily as needed (swelling). 30 tablet 0  . omeprazole (PRILOSEC) 40 MG capsule Take 40 mg by mouth daily.    . pramipexole (MIRAPEX) 0.5 MG tablet Take 1 tablet (0.5 mg total) by mouth at bedtime. 30 tablet 11  . pravastatin (PRAVACHOL) 20 MG tablet Take 20 mg by mouth daily.    . predniSONE (DELTASONE) 20 MG tablet Take 40 mg by mouth daily with breakfast.     . pyridostigmine (MESTINON) 60 MG tablet Take 60 mg by mouth 4 (four) times daily.     Marland Kitchen tiotropium (SPIRIVA) 18 MCG inhalation capsule Place 18 mcg into inhaler and inhale daily.    Marland Kitchen torsemide (DEMADEX) 20 MG  tablet Take 2 tablets (40 mg total) once daily    . vitamin B-12 (CYANOCOBALAMIN) 1000 MCG tablet Take 1,000 mcg by mouth daily.      Results for orders placed or performed during the hospital encounter of 06/10/19 (from the past 48 hour(s))  Lipase, blood     Status: None   Collection Time: 06/10/19  3:03 PM  Result Value Ref Range   Lipase 30 11 - 51 U/L    Comment: Performed at [facility], [address] [place], [place] [address]  Comprehensive metabolic panel     Status: Abnormal   Collection Time: 06/10/19  3:03 PM  Result Value Ref Range   Sodium 129 (L) 135 - 145 mmol/L   Potassium 2.3 (LL) 3.5 - 5.1 mmol/L    Comment: CRITICAL RESULT CALLED TO, READ  BACK BY AND VERIFIED WITH SHANNON MARTIN 06/10/19 1657 KLW    Chloride <65 (LL) 98 - 111 mmol/L    Comment: CRITICAL RESULT CALLED TO, READ BACK BY AND VERIFIED WITH SHANNON MARTIN 06/10/19 1657 KLW    CO2 43 (H) 22 - 32 mmol/L   Glucose, Bld 181 (H) 70 - 99 mg/dL   BUN 31 (H) 8 - 23 mg/dL   Creatinine, Ser 1.73 (H) 0.61 - 1.24 mg/dL   Calcium 9.1 8.9 - 10.3 mg/dL   Total Protein 7.6 6.5 - 8.1 g/dL   Albumin 4.4 3.5 - 5.0 g/dL   AST 45 (H) 15 - 41 U/L   ALT 37 0 - 44 U/L   Alkaline Phosphatase 47 38 - 126 U/L   Total Bilirubin 1.9 (H) 0.3 - 1.2 mg/dL   GFR calc non Af Amer 39 (L) >60 mL/min   GFR calc Af Amer 45 (L) >60 mL/min    Comment: Performed at [facility], [address] [place], [place] [address]  CBC     Status: Abnormal   Collection Time: 06/10/19  3:03 PM  Result Value Ref Range   WBC 14.9 (H) 4.0 - 10.5 K/uL   RBC 5.92 (H) 4.22 - 5.81 MIL/uL   Hemoglobin 18.3 (H) 13.0 - 17.0 g/dL   HCT 53.5 (H) 39.0 - 52.0 %   MCV 90.4 80.0 - 100.0 fL   MCH 30.9 26.0 - 34.0 pg   MCHC 34.2 30.0 - 36.0 g/dL   RDW 13.4 11.5 - 15.5 %   Platelets 194 150 - 400 K/uL   nRBC 0.0 0.0 - 0.2 %    Comment: Performed at [facility], [place], [place] [address]  Troponin I (High Sensitivity)     Status: Abnormal   Collection Time: 06/10/19  3:03 PM  Result Value Ref Range   Troponin I (High Sensitivity) 37 (H) <18 ng/L    Comment: (NOTE) Elevated high sensitivity troponin I (hsTnI) values and significant  changes across serial measurements may suggest ACS but many other  chronic and acute conditions are known to elevate hsTnI results.  Refer to the Links section for chest pain algorithms and additional  guidance. Performed at [facility], [address] [place], [place] [address]   Urinalysis, Complete w Microscopic     Status: Abnormal   Collection Time: 06/10/19  5:15 PM  Result Value Ref Range   Color, Urine YELLOW (A) YELLOW    APPearance HAZY (A) CLEAR   Specific Gravity, Urine >1.046 (H) 1.005 - 1.030   pH 7.0 5.0 - 8.0   Glucose, UA NEGATIVE NEGATIVE mg/dL  Hgb urine dipstick SMALL (A) NEGATIVE   Bilirubin Urine NEGATIVE NEGATIVE   Ketones, ur NEGATIVE NEGATIVE mg/dL   Protein, ur 30 (A) NEGATIVE mg/dL   Nitrite NEGATIVE NEGATIVE   Leukocytes,Ua NEGATIVE NEGATIVE   RBC / HPF 6-10 0 - 5 RBC/hpf   WBC, UA 0-5 0 - 5 WBC/hpf   Bacteria, UA NONE SEEN NONE SEEN   Squamous Epithelial / LPF 0-5 0 - 5    Comment: Performed at [facility], [place], [place] [address]  Troponin I (High Sensitivity)     Status: Abnormal   Collection Time: 06/10/19  5:15 PM  Result Value Ref Range   Troponin I (High Sensitivity) 40 (H) <18 ng/L    Comment: (NOTE) Elevated high sensitivity troponin I (hsTnI) values and significant  changes across serial measurements may suggest ACS but many other  chronic and acute conditions are known to elevate hsTnI results.  Refer to the "Links" section for chest pain algorithms and additional  guidance. Performed at [facility], [address] [place], [place] [address]   TSH     Status: Abnormal   Collection Time: 06/10/19  5:15 PM  Result Value Ref Range   TSH 0.235 (L) 0.350 - 4.500 uIU/mL    Comment: Performed by a 3rd Generation assay with a functional sensitivity of <=0.01 uIU/mL. Performed at [facility], [address] [place], [place] [address]   SARS Coronavirus 2 (CEPHEID- Performed in [facility] hospital lab), Hosp Order     Status: None   Collection Time: 06/10/19  5:39 PM   Specimen: Nasopharyngeal Swab  Result Value Ref Range   SARS Coronavirus 2 NEGATIVE NEGATIVE    Comment: (NOTE) If result is NEGATIVE SARS-CoV-2 target nucleic acids are NOT DETECTED. The SARS-CoV-2 RNA is generally detectable in upper and lower  respiratory specimens during the acute phase of infection. The lowest  concentration of SARS-CoV-2 viral  copies this assay can detect is 250  copies / mL. A negative result does not preclude SARS-CoV-2 infection  and should not be used as the sole basis for treatment or other  patient management decisions.  A negative result may occur with  improper specimen collection / handling, submission of specimen other  than nasopharyngeal swab, presence of viral mutation(s) within the  areas targeted by this assay, and inadequate number of viral copies  (<250 copies / mL). A negative result must be combined with clinical  observations, patient history, and epidemiological information. If result is POSITIVE SARS-CoV-2 target nucleic acids are DETECTED. The SARS-CoV-2 RNA is generally detectable in upper and lower  respiratory specimens dur ing the acute phase of infection.  Positive  results are indicative of active infection with SARS-CoV-2.  Clinical  correlation with patient history and other diagnostic information is  necessary to determine patient infection status.  Positive results do  not rule out bacterial infection or co-infection with other viruses. If result is PRESUMPTIVE POSTIVE SARS-CoV-2 nucleic acids MAY BE PRESENT.   A presumptive positive result was obtained on the submitted specimen  and confirmed on repeat testing.  While 2019 novel coronavirus  (SARS-CoV-2) nucleic acids may be present in the submitted sample  additional confirmatory testing may be necessary for epidemiological  and / or clinical management purposes  to differentiate between  SARS-CoV-2 and other Sarbecovirus currently known to infect humans.  If clinically indicated additional testing with an alternate test  methodology [phone]) is advised. The SARS-CoV-2 RNA is generally  detectable in upper and lower respiratory sp ecimens during the acute  phase of infection. The expected result is Negative. Fact Sheet for Patients:  StrictlyIdeas.no Fact Sheet for Healthcare  Providers: BankingDealers.co.za This test is not yet approved or cleared by the [place] FDA and has been authorized for detection and/or diagnosis of SARS-CoV-2 by FDA under an Emergency Use Authorization (EUA).  This EUA will remain in effect (meaning this test can be used) for the duration of the COVID-19 declaration under Section 564(b)(1) of the Act, 21 U.S.C. section 360bbb-3(b)(1), unless the authorization is terminated or revoked sooner. Performed at [facility], [address] [place], [place] [address]   Magnesium     Status: Abnormal   Collection Time: 06/10/19  7:17 PM  Result Value Ref Range   Magnesium 2.9 (H) 1.7 - 2.4 mg/dL    Comment: Performed at [facility], [address] [place], [place] [address]  Brain natriuretic peptide     Status: Abnormal   Collection Time: 06/10/19  7:30 PM  Result Value Ref Range   B Natriuretic Peptide 144.0 (H) 0.0 - 100.0 pg/mL    Comment: Performed at [facility], [address] [place], [place] [address]  Blood gas, venous     Status: Abnormal   Collection Time: 06/10/19  7:40 PM  Result Value Ref Range   pH, Ven 7.58 (H) 7.250 - 7.430   pCO2, Ven 67 (H) 44.0 - 60.0 mmHg   pO2, Ven <31.0 (LL) 32.0 - 45.0 mmHg   Bicarbonate 62.8 (H) 20.0 - 28.0 mmol/L   Acid-Base Excess 34.7 (H) 0.0 - 2.0 mmol/L   O2 Saturation 55.9 %   Patient temperature 37.0    Collection site VEIN    Sample type VENOUS     Comment: Performed at [facility], [address] [place], [place] [address]   Ct Abdomen Pelvis W Contrast  Result Date: 06/10/2019 CLINICAL DATA:  73 year old male with abdominal pain and nausea. EXAM: CT ABDOMEN AND PELVIS WITH CONTRAST TECHNIQUE: Multidetector CT imaging of the abdomen and pelvis was performed using the standard protocol following bolus administration of intravenous contrast. CONTRAST:  31mL OMNIPAQUE IOHEXOL 300 MG/ML  SOLN COMPARISON:  Abdomen  MRI 10/20/2015. FINDINGS: Lower chest: Mediastinal lipomatosis. Mild scarring in the left lower lobe. No pericardial or pleural effusion. Hepatobiliary: Hepatic steatosis. Otherwise negative liver and gallbladder. Pancreas: Fatty atrophied but otherwise negative. Spleen: Negative; punctate calcified granuloma. Adrenals/Urinary Tract: Normal adrenal glands. Bilateral renal enhancement and contrast excretion is symmetric and normal. Decompressed renal collecting systems and proximal ureters. No nephrolithiasis. Negative ureters. Unremarkable urinary bladder. Stomach/Bowel: Negative descending and rectosigmoid colon. Negative transverse colon. Negative right colon and appendix (coronal image 61). Negative terminal ileum. No dilated small bowel. Negative stomach. No free air, free fluid, mesenteric stranding. Vascular/Lymphatic: Aortoiliac calcified atherosclerosis. Tortuous descending thoracic aorta. Major arterial structures are patent. Portal venous system is patent. No lymphadenopathy. Reproductive: Negative. Other: No pelvic free fluid. Musculoskeletal: Advanced disc and endplate degeneration in much of the lumbar spine. Grade 1 anterolisthesis at L4-L5 with multifactorial spinal stenosis. No acute osseous abnormality identified. IMPRESSION: 1. No acute or inflammatory process identified in the abdomen or pelvis. Normal appendix. 2. Hepatic steatosis. 3. 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 4.  Aortic Atherosclerosis (ICD10-I70.0). Electronically Signed   By: Genevie Ann M.D.   On: 06/10/2019 18:42   Dg Chest Portable 1 View  Result Date: 06/10/2019 CLINICAL DATA:  Chest pressure,  abdominal pain, and nausea. EXAM: PORTABLE CHEST 1 VIEW COMPARISON:  05/30/2019 FINDINGS: The cardiac silhouette remains mildly enlarged. Aortic atherosclerosis is noted. Scarring is again seen in the left lung base. No acute airspace consolidation, overt pulmonary edema, sizable pleural effusion,  pneumothorax is identified. No acute osseous abnormality is seen. IMPRESSION: No evidence of acute cardiopulmonary process. Electronically Signed   By: Logan Bores M.D.   On: 06/10/2019 18:30    Review of Systems  Constitutional: Negative for chills and fever.  HENT: Negative for sore throat and tinnitus.   Eyes: Negative for blurred vision and redness.  Respiratory: Positive for shortness of breath. Negative for cough.   Cardiovascular: Negative for chest pain, palpitations, orthopnea and PND.  Gastrointestinal: Positive for abdominal pain, diarrhea, nausea and vomiting.  Genitourinary: Negative for dysuria, frequency and urgency.  Musculoskeletal: Negative for joint pain and myalgias.  Skin: Negative for rash.       No lesions  Neurological: Negative for speech change, focal weakness and weakness.  Endo/Heme/Allergies: Does not bruise/bleed easily.       No temperature intolerance  Psychiatric/Behavioral: Negative for depression and suicidal ideas.    Blood pressure (!) 146/65, pulse 78, temperature 98.5 F (36.9 C), temperature source Oral, resp. rate 20, height 5\' 7"  (1.702 m), weight 102.5 kg, SpO2 97 %. Physical Exam  Vitals reviewed. Constitutional: He is oriented to person, place, and time. He appears well-developed and well-nourished. No distress.  HENT:  Head: Normocephalic and atraumatic.  Mouth/Throat: Oropharynx is clear and moist.  Eyes: Pupils are equal, round, and reactive to light. Conjunctivae and EOM are normal. No scleral icterus.  Neck: Normal range of motion. Neck supple. No JVD present. No tracheal deviation present. No thyromegaly present.  Respiratory: Effort normal and breath sounds normal. No respiratory distress.  GI: Soft. Bowel sounds are normal. He exhibits no distension. There is no abdominal tenderness.  Musculoskeletal: Normal range of motion.        General: No edema.  Lymphadenopathy:    He has no cervical adenopathy.  Neurological: He is alert  and oriented to person, place, and time. No cranial nerve deficit.  Skin: Skin is warm and dry. No rash noted. No erythema.  Psychiatric: He has a normal mood and affect. His behavior is normal. Judgment and thought content normal.     Assessment/Plan This is a 73 year old male admitted for acute kidney injury. 1.  AKI: Superimposed upon chronic kidney disease.  Prerenal secondary to dehydration from diarrhea/vomiting as well as continued diuretic use.  Hydrate with intravenous fluid.  Replete potassium. 2.  Diarrhea: Likely viral.  He is afebrile.  The patient denies travel risk factors or known contact with individuals with exposure to novel coronavirus.  I have covered him with Zosyn for now.  Check viral GI panel 3.  CHF: Systolic; nonischemic.  The patient cannot recall his EF but reports a negative heart catheterization.  I have adjusted his diuretic regimen in order not to fluid overload the patient as we improve his volume status 4.  Hypertension: Intermittently controlled.  Continue to monitor telemetry.  Continue amlodipine.  Labetalol as needed. 5.  Myasthenia gravis: Stable; continue pyridostigmine and prednisone per home regimen 6.  COPD: Contributing to shortness of breath.  Supplemental oxygen as needed.  Continue Spiriva 7.  Hyperlipidemia: Continue statin therapy 8.  DVT prophylaxis: Heparin 9.  GI prophylaxis: Pantoprazole per home regimen   Harrie Foreman, MD 06/11/2019, 9:16 AM

## 2019-06-12 LAB — BASIC METABOLIC PANEL
Anion gap: 12 (ref 5–15)
Anion gap: 14 (ref 5–15)
BUN: 34 mg/dL — ABNORMAL HIGH (ref 8–23)
BUN: 28 mg/dL — ABNORMAL HIGH (ref 8–23)
CO2: 40 mmol/L — ABNORMAL HIGH (ref 22–32)
CO2: 36 mmol/L — ABNORMAL HIGH (ref 22–32)
Calcium: 7.8 mg/dL — ABNORMAL LOW (ref 8.9–10.3)
Calcium: 7.5 mg/dL — ABNORMAL LOW (ref 8.9–10.3)
Chloride: 80 mmol/L — ABNORMAL LOW (ref 98–111)
Chloride: 82 mmol/L — ABNORMAL LOW (ref 98–111)
Creatinine, Ser: 1.43 mg/dL — ABNORMAL HIGH (ref 0.61–1.24)
Creatinine, Ser: 1.24 mg/dL (ref 0.61–1.24)
GFR calc Af Amer: 56 mL/min — ABNORMAL LOW (ref >60)
GFR calc Af Amer: >60 mL/min (ref >60)
GFR calc non Af Amer: 49 mL/min — ABNORMAL LOW (ref >60)
GFR calc non Af Amer: 58 mL/min — ABNORMAL LOW (ref >60)
Glucose, Bld: 178 mg/dL — ABNORMAL HIGH (ref 70–99)
Glucose, Bld: 243 mg/dL — ABNORMAL HIGH (ref 70–99)
Potassium: 3 mmol/L — ABNORMAL LOW (ref 3.5–5.1)
Potassium: 3.4 mmol/L — ABNORMAL LOW (ref 3.5–5.1)
Sodium: 132 mmol/L — ABNORMAL LOW (ref 135–145)
Sodium: 132 mmol/L — ABNORMAL LOW (ref 135–145)

## 2019-06-12 LAB — CBC
HCT: 42.9 % (ref 39.0–52.0)
Hemoglobin: 14.3 g/dL (ref 13.0–17.0)
MCH: 30.6 pg (ref 26.0–34.0)
MCHC: 33.3 g/dL (ref 30.0–36.0)
MCV: 91.7 fL (ref 80.0–100.0)
Platelets: 160 10*3/uL (ref 150–400)
RBC: 4.68 MIL/uL (ref 4.22–5.81)
RDW: 13.8 % (ref 11.5–15.5)
WBC: 19.8 10*3/uL — ABNORMAL HIGH (ref 4.0–10.5)
nRBC: 0 % (ref 0.0–0.2)

## 2019-06-12 LAB — GLUCOSE, CAPILLARY: Glucose-Capillary: 131 mg/dL — ABNORMAL HIGH (ref 70–99)

## 2019-06-12 MED ORDER — ENOXAPARIN SODIUM 40 MG/0.4ML ~~LOC~~ SOLN
40.0000 mg | SUBCUTANEOUS | Status: DC
  Administered 2019-06-12: 40 mg via SUBCUTANEOUS
  Filled 2019-06-12: qty 0.4

## 2019-06-12 MED ORDER — INSULIN ASPART 100 UNIT/ML ~~LOC~~ SOLN: 0.0000 [IU] | Freq: Three times a day (TID) | SUBCUTANEOUS | Status: DC

## 2019-06-12 MED ORDER — POTASSIUM CHLORIDE CRYS ER 20 MEQ PO TBCR
40.0000 meq | EXTENDED_RELEASE_TABLET | ORAL | Status: AC
  Administered 2019-06-12 (×2): 40 meq via ORAL
  Filled 2019-06-12 (×2): qty 2

## 2019-06-12 MED ORDER — INSULIN ASPART 100 UNIT/ML ~~LOC~~ SOLN: 0.0000 [IU] | Freq: Every day | SUBCUTANEOUS | Status: DC

## 2019-06-12 MED ORDER — AMOXICILLIN-POT CLAVULANATE 875-125 MG PO TABS
1.0000 | ORAL_TABLET | Freq: Two times a day (BID) | ORAL | Status: DC
  Administered 2019-06-12 – 2019-06-13 (×3): 1 via ORAL
  Filled 2019-06-12 (×3): qty 1

## 2019-06-12 MED ORDER — AMOXICILLIN-POT CLAVULANATE 875-125 MG PO TABS: 1.0000 | ORAL_TABLET | Freq: Two times a day (BID) | ORAL | Status: DC

## 2019-06-12 NOTE — Progress Notes (Signed)
[place] at [facility]                                                                                                                                                                                  Patient Demographics   Joseph Macdonald, is a 73 y.o. male, DOB - 03-30-1946, PXT:[id number]  Admit date - 06/10/2019   Admitting Physician Harrie Foreman, MD  Outpatient Primary MD for the patient is Fisher, Kirstie Peri, MD   LOS - 1  Subjective: Patient states that he is feeling better He wants to go home   Review of Systems:   CONSTITUTIONAL: No documented fever. No fatigue, positive weakness. No weight gain, no weight loss.  EYES: No blurry or double vision.  ENT: No tinnitus. No postnasal drip. No redness of the oropharynx.  RESPIRATORY: No cough, no wheeze, no hemoptysis. No dyspnea.  CARDIOVASCULAR: No chest pain. No orthopnea. No palpitations. No syncope.  GASTROINTESTINAL: No nausea, no vomiting or diarrhea. No abdominal pain. No melena or hematochezia.  GENITOURINARY: No dysuria or hematuria.  ENDOCRINE: No polyuria or nocturia. No heat or cold intolerance.  HEMATOLOGY: No anemia. No bruising. No bleeding.  INTEGUMENTARY: No rashes. No lesions.  MUSCULOSKELETAL: No arthritis. No swelling. No gout.  NEUROLOGIC: No numbness, tingling, or ataxia. No seizure-type activity.  PSYCHIATRIC: No anxiety. No insomnia. No ADD.    Vitals:   Vitals:   06/12/19 0223 06/12/19 0319 06/12/19 0405 06/12/19 0607  BP: (!) 147/75 108/63 109/66 138/79  Pulse: 63 (!) 58 (!) 55 (!) 56  Resp: 20 16    Temp: (!) 97.3 F (36.3 C) 97.8 F (36.6 C) 97.9 F (36.6 C) 97.6 F (36.4 C)  TempSrc: Oral Oral Oral Oral  SpO2: 96% 99% 97% 94%  Weight:    103.5 kg  Height:        Wt Readings from Last 3 Encounters:  06/12/19 103.5 kg  05/30/19 109.8 kg  05/13/19 105.1 kg     Intake/Output Summary (Last 24 hours) at 06/12/2019 1310 Last data filed at 06/12/2019  0900 Gross per 24 hour  Intake 2407.9 ml  Output 1625 ml  Net 782.9 ml    Physical Exam:   GENERAL: Pleasant-appearing in no apparent distress.  HEAD, EYES, EARS, NOSE AND THROAT: Atraumatic, normocephalic. Extraocular muscles are intact. Pupils equal and reactive to light. Sclerae anicteric. No conjunctival injection. No oro-pharyngeal erythema.  NECK: Supple. There is no jugular venous distention. No bruits, no lymphadenopathy, no thyromegaly.  HEART: Regular rate and rhythm,. No murmurs, no rubs, no clicks.  LUNGS: Clear to auscultation bilaterally. No rales or rhonchi. No wheezes.  ABDOMEN: Soft, flat, nontender,  nondistended. Has good bowel sounds. No hepatosplenomegaly appreciated.  EXTREMITIES: No evidence of any cyanosis, clubbing, or peripheral edema.  +2 pedal and radial pulses bilaterally.  NEUROLOGIC: The patient is alert, awake, and oriented x3 with no focal motor or sensory deficits appreciated bilaterally.  SKIN: Moist and warm with no rashes appreciated.  Psych: Not anxious, depressed LN: No inguinal LN enlargement    Antibiotics   Anti-infectives (From admission, onward)   Start     Dose/Rate Route Frequency Ordered Stop   06/12/19 1315  amoxicillin-clavulanate (AUGMENTIN) 875-125 MG per tablet 1 tablet     1 tablet Oral Every 12 hours 06/12/19 1308        Medications   Scheduled Meds: . amLODipine  5 mg Oral Daily  . amoxicillin-clavulanate  1 tablet Oral Q12H  . aspirin EC  81 mg Oral Daily  . docusate sodium  100 mg Oral BID  . gabapentin  600 mg Oral QHS  . heparin  5,000 Units Subcutaneous Q8H  . pantoprazole  80 mg Oral Daily  . potassium chloride  40 mEq Oral Q4H  . pramipexole  0.5 mg Oral QHS  . pravastatin  20 mg Oral Daily  . predniSONE  40 mg Oral Q breakfast  . pyridostigmine  60 mg Oral QID  . sodium chloride flush  10-40 mL Intracatheter Q12H  . tiotropium  18 mcg Inhalation Daily  . torsemide  20 mg Oral QODAY  . torsemide  40 mg Oral  QODAY  . vitamin B-12  1,000 mcg Oral Daily   Continuous Infusions: . 0.9 % NaCl with KCl 40 mEq / L Stopped (06/12/19 0220)   PRN Meds:.acetaminophen **OR** acetaminophen, albuterol, HYDROcodone-acetaminophen, ondansetron **OR** ondansetron (ZOFRAN) IV, prochlorperazine, sodium chloride flush   Data Review:   Micro Results Recent Results (from the past 240 hour(s))  SARS Coronavirus 2 (CEPHEID- Performed in [place] hospital lab), Hosp Order     Status: None   Collection Time: 06/10/19  5:39 PM   Specimen: Nasopharyngeal Swab  Result Value Ref Range Status   SARS Coronavirus 2 NEGATIVE NEGATIVE Final    Comment: (NOTE) If result is NEGATIVE SARS-CoV-2 target nucleic acids are NOT DETECTED. The SARS-CoV-2 RNA is generally detectable in upper and lower  respiratory specimens during the acute phase of infection. The lowest  concentration of SARS-CoV-2 viral copies this assay can detect is 250  copies / mL. A negative result does not preclude SARS-CoV-2 infection  and should not be used as the sole basis for treatment or other  patient management decisions.  A negative result may occur with  improper specimen collection / handling, submission of specimen other  than nasopharyngeal swab, presence of viral mutation(s) within the  areas targeted by this assay, and inadequate number of viral copies  (<250 copies / mL). A negative result must be combined with clinical  observations, patient history, and epidemiological information. If result is POSITIVE SARS-CoV-2 target nucleic acids are DETECTED. The SARS-CoV-2 RNA is generally detectable in upper and lower  respiratory specimens dur ing the acute phase of infection.  Positive  results are indicative of active infection with SARS-CoV-2.  Clinical  correlation with patient history and other diagnostic information is  necessary to determine patient infection status.  Positive results do  not rule out bacterial infection or  co-infection with other viruses. If result is PRESUMPTIVE POSTIVE SARS-CoV-2 nucleic acids MAY BE PRESENT.   A presumptive positive result was obtained on the submitted specimen  and  confirmed on repeat testing.  While 2019 novel coronavirus  (SARS-CoV-2) nucleic acids may be present in the submitted sample  additional confirmatory testing may be necessary for epidemiological  and / or clinical management purposes  to differentiate between  SARS-CoV-2 and other Sarbecovirus currently known to infect humans.  If clinically indicated additional testing with an alternate test  methodology [phone]) is advised. The SARS-CoV-2 RNA is generally  detectable in upper and lower respiratory sp ecimens during the acute  phase of infection. The expected result is Negative. Fact Sheet for Patients:  StrictlyIdeas.no Fact Sheet for Healthcare Providers: BankingDealers.co.za This test is not yet approved or cleared by the [place] FDA and has been authorized for detection and/or diagnosis of SARS-CoV-2 by FDA under an Emergency Use Authorization (EUA).  This EUA will remain in effect (meaning this test can be used) for the duration of the COVID-19 declaration under Section 564(b)(1) of the Act, 21 U.S.C. section 360bbb-3(b)(1), unless the authorization is terminated or revoked sooner. Performed at [facility], [address] [place], [place] [address]     Radiology Reports Dg Chest 2 View  Result Date: 05/30/2019 CLINICAL DATA:  Cough with sinus drainage, shortness of breath. History of COPD/CHF. EXAM: CHEST - 2 VIEW COMPARISON:  None. FINDINGS: The heart is enlarged. There is mild aortic atherosclerosis. Linear opacities at both lung bases are most consistent with atelectasis or scarring. There is prominent extrapleural fat bilaterally, but no evidence pleural effusion. There is no confluent airspace opacity, edema or pneumothorax.  Mild degenerative changes are present within the thoracic spine. IMPRESSION: Cardiomegaly with mild bibasilar atelectasis or scarring. No edema or acute findings. Electronically Signed   By: Richardean Sale M.D.   On: 05/30/2019 15:02   Ct Abdomen Pelvis W Contrast  Result Date: 06/10/2019 CLINICAL DATA:  73 year old male with abdominal pain and nausea. EXAM: CT ABDOMEN AND PELVIS WITH CONTRAST TECHNIQUE: Multidetector CT imaging of the abdomen and pelvis was performed using the standard protocol following bolus administration of intravenous contrast. CONTRAST:  64mL OMNIPAQUE IOHEXOL 300 MG/ML  SOLN COMPARISON:  Abdomen MRI 10/20/2015. FINDINGS: Lower chest: Mediastinal lipomatosis. Mild scarring in the left lower lobe. No pericardial or pleural effusion. Hepatobiliary: Hepatic steatosis. Otherwise negative liver and gallbladder. Pancreas: Fatty atrophied but otherwise negative. Spleen: Negative; punctate calcified granuloma. Adrenals/Urinary Tract: Normal adrenal glands. Bilateral renal enhancement and contrast excretion is symmetric and normal. Decompressed renal collecting systems and proximal ureters. No nephrolithiasis. Negative ureters. Unremarkable urinary bladder. Stomach/Bowel: Negative descending and rectosigmoid colon. Negative transverse colon. Negative right colon and appendix (coronal image 61). Negative terminal ileum. No dilated small bowel. Negative stomach. No free air, free fluid, mesenteric stranding. Vascular/Lymphatic: Aortoiliac calcified atherosclerosis. Tortuous descending thoracic aorta. Major arterial structures are patent. Portal venous system is patent. No lymphadenopathy. Reproductive: Negative. Other: No pelvic free fluid. Musculoskeletal: Advanced disc and endplate degeneration in much of the lumbar spine. Grade 1 anterolisthesis at L4-L5 with multifactorial spinal stenosis. No acute osseous abnormality identified. IMPRESSION: 1. No acute or inflammatory process identified in the  abdomen or pelvis. Normal appendix. 2. Hepatic steatosis. 3. 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 4.  Aortic Atherosclerosis (ICD10-I70.0). Electronically Signed   By: Genevie Ann M.D.   On: 06/10/2019 18:42   Dg Chest Portable 1 View  Result Date: 06/10/2019 CLINICAL DATA:  Chest pressure, abdominal pain, and nausea. EXAM: PORTABLE CHEST 1 VIEW COMPARISON:  05/30/2019 FINDINGS: The cardiac silhouette remains mildly enlarged. Aortic atherosclerosis is noted.  Scarring is again seen in the left lung base. No acute airspace consolidation, overt pulmonary edema, sizable pleural effusion, pneumothorax is identified. No acute osseous abnormality is seen. IMPRESSION: No evidence of acute cardiopulmonary process. Electronically Signed   By: Logan Bores M.D.   On: 06/10/2019 18:30     CBC Recent Labs  Lab 06/10/19 1503 06/12/19 0255  WBC 14.9* 19.8*  HGB 18.3* 14.3  HCT 53.5* 42.9  PLT 194 160  MCV 90.4 91.7  MCH 30.9 30.6  MCHC 34.2 33.3  RDW 13.4 13.8    Chemistries  Recent Labs  Lab 06/10/19 1503 06/10/19 1917 06/11/19 1131 06/12/19 0255  NA 129*  --   --  132*  K 2.3*  --  2.7* 3.0*  CL <65*  --   --  80*  CO2 43*  --   --  40*  GLUCOSE 181*  --   --  178*  BUN 31*  --   --  34*  CREATININE 1.73*  --   --  1.43*  CALCIUM 9.1  --   --  7.8*  MG  --  2.9*  --   --   AST 45*  --   --   --   ALT 37  --   --   --   ALKPHOS 47  --   --   --   BILITOT 1.9*  --   --   --    ------------------------------------------------------------------------------------------------------------------ estimated creatinine clearance is 53.6 mL/min (A) (by C-G formula based on SCr of 1.43 mg/dL (H)). ------------------------------------------------------------------------------------------------------------------ No results for input(s): HGBA1C in the last 72  hours. ------------------------------------------------------------------------------------------------------------------ No results for input(s): CHOL, HDL, LDLCALC, TRIG, CHOLHDL, LDLDIRECT in the last 72 hours. ------------------------------------------------------------------------------------------------------------------ Recent Labs    06/10/19 1715  TSH 0.235*   ------------------------------------------------------------------------------------------------------------------ No results for input(s): VITAMINB12, FOLATE, FERRITIN, TIBC, IRON, RETICCTPCT in the last 72 hours.  Coagulation profile No results for input(s): INR, PROTIME in the last 168 hours.  No results for input(s): DDIMER in the last 72 hours.  Cardiac Enzymes No results for input(s): CKMB, TROPONINI, MYOGLOBIN in the last 168 hours.  Invalid input(s): CK ------------------------------------------------------------------------------------------------------------------ Invalid input(s): [place]   This is a 73 year old male admitted for acute kidney injury. 1.  AKI: Superimposed upon chronic kidney disease.  Improved with IV fluids   2.  Severe hypokalemia replaced potassium   3  Diarrhea: Likely viral.   Now resolved WBC is elevated I will place patient on Augmentin   4.  CHF: Systolic; nonischemic.    Follow cardiac status due to acute kidney injury with holding Lasix  5.  Hypertension: Intermittently controlled.  Continue to monitor telemetry.  Continue amlodipine.  Labetalol as needed.  6.  Myasthenia gravis: Stable; continue pyridostigmine and prednisone per home regimen  7.  COPD: Contributing to shortness of breath.  Supplemental oxygen as needed.  Continue Spiriva  8  Hyperlipidemia: Continue statin therapy  9.  DVT prophylaxis: Heparin  10  GI prophylaxis: Pantoprazole per home regimen     Code Status Orders  (From admission, onward)         Start     Ordered    06/11/19 0332  Full code  Continuous     06/11/19 0331        Code Status History    This patient has a current code status but no historical code status.   Advance Care Planning Activity  Consults  none  DVT Prophylaxis  heparin  Lab Results  Component Value Date   PLT 160 06/12/2019     Time Spent in minutes 4min Greater than 50% of time spent in care coordination and counseling patient regarding the condition and plan of care.   Dustin Flock M.D on 06/12/2019 at 1:10 PM  Between 7am to 6pm - Pager - [phone]  After 6pm go to [email or web address] - Proofreader  [employer]   Office  [phone]

## 2019-06-12 NOTE — Progress Notes (Signed)
At [place] 06/12/2019 Patient was asleep and had an unwitnessed fall. Patient refused to inform family of accident. Patient states that he was falling off [facility]. Before patient was alert and oriented and is currently the same. No lumps and bumps on skull,no headache, range of motion done, no pain during motion of joints of hips, knees, ankle, shoulders, elbows, wrist and neck. Dr. Sidney Ace was informed, Management, Nurse supervisor Colletta Maryland ,and  Charge nurse [place]. Patient currently has bed alarm on and will continue to monitor to any changes

## 2019-06-12 NOTE — Care Management Important Message (Signed)
Important Message  Patient Details  Name: Joseph Macdonald MRN: [id number] Date of Birth: 07-29-1946   Medicare Important Message Given:  Yes  Initial Medicare IM given by Patient Access Associate on 06/11/2019 at 1:38pm. Still valid.   Dannette Barbara 06/12/2019, 2:56 PM

## 2019-06-13 LAB — CBC WITH DIFFERENTIAL/PLATELET
Abs Immature Granulocytes: 0.08 10*3/uL — ABNORMAL HIGH (ref 0.00–0.07)
Basophils Absolute: 0 10*3/uL (ref 0.0–0.1)
Basophils Relative: 0 %
Eosinophils Absolute: 0 10*3/uL (ref 0.0–0.5)
Eosinophils Relative: 0 %
HCT: 42.8 % (ref 39.0–52.0)
Hemoglobin: 14 g/dL (ref 13.0–17.0)
Immature Granulocytes: 1 %
Lymphocytes Relative: 14 %
Lymphs Abs: 1.8 10*3/uL (ref 0.7–4.0)
MCH: 31 pg (ref 26.0–34.0)
MCHC: 32.7 g/dL (ref 30.0–36.0)
MCV: 94.7 fL (ref 80.0–100.0)
Monocytes Absolute: 0.9 10*3/uL (ref 0.1–1.0)
Monocytes Relative: 7 %
Neutro Abs: 10.2 10*3/uL — ABNORMAL HIGH (ref 1.7–7.7)
Neutrophils Relative %: 78 %
Platelets: 147 10*3/uL — ABNORMAL LOW (ref 150–400)
RBC: 4.52 MIL/uL (ref 4.22–5.81)
RDW: 14 % (ref 11.5–15.5)
WBC: 13 10*3/uL — ABNORMAL HIGH (ref 4.0–10.5)
nRBC: 0 % (ref 0.0–0.2)

## 2019-06-13 LAB — BASIC METABOLIC PANEL
Anion gap: 9 (ref 5–15)
BUN: 24 mg/dL — ABNORMAL HIGH (ref 8–23)
CO2: 39 mmol/L — ABNORMAL HIGH (ref 22–32)
Calcium: 7 mg/dL — ABNORMAL LOW (ref 8.9–10.3)
Chloride: 87 mmol/L — ABNORMAL LOW (ref 98–111)
Creatinine, Ser: 1.22 mg/dL (ref 0.61–1.24)
GFR calc Af Amer: >60 mL/min (ref >60)
GFR calc non Af Amer: 59 mL/min — ABNORMAL LOW (ref >60)
Glucose, Bld: 112 mg/dL — ABNORMAL HIGH (ref 70–99)
Potassium: 2.9 mmol/L — ABNORMAL LOW (ref 3.5–5.1)
Sodium: 135 mmol/L (ref 135–145)

## 2019-06-13 LAB — GLUCOSE, CAPILLARY: Glucose-Capillary: 108 mg/dL — ABNORMAL HIGH (ref 70–99)

## 2019-06-13 MED ORDER — OSCAL 500/200 D-3 500-200 MG-UNIT PO TABS: 1.0000 | ORAL_TABLET | Freq: Two times a day (BID) | ORAL | 3 refills | Status: DC

## 2019-06-13 MED ORDER — AMOXICILLIN-POT CLAVULANATE 875-125 MG PO TABS: 1.0000 | ORAL_TABLET | Freq: Two times a day (BID) | ORAL | 0 refills | Status: AC

## 2019-06-13 MED ORDER — POTASSIUM CHLORIDE CRYS ER 20 MEQ PO TBCR
40.0000 meq | EXTENDED_RELEASE_TABLET | ORAL | Status: DC
  Administered 2019-06-13: 40 meq via ORAL
  Filled 2019-06-13: qty 2

## 2019-06-13 MED ORDER — POTASSIUM CHLORIDE CRYS ER 20 MEQ PO TBCR: 40.0000 meq | EXTENDED_RELEASE_TABLET | Freq: Two times a day (BID) | ORAL | 0 refills | Status: DC

## 2019-06-13 NOTE — Progress Notes (Signed)
Discharge order received. Patient mental status is at baseline. Vital signs stable . No signs of acute distress. Discharge instructions given. Patient verbalized understanding. No other issues noted at this time.   

## 2019-06-13 NOTE — Discharge Summary (Signed)
[facility], 73 y.o., DOB 02/07/1946, MRN [id number]. 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  The patient has had several days of nausea, vomiting and diarrhea.  Emesis and stool was been nonbloody.  Patient came to the ED and was noted to have acute renal failure severe hypokalemia he was very dehydrated.  He was given IV fluids with significant improvement in his symptoms.  Patient has been very anxious to go home.  His potassium is still low he will need to continue to have this replaced and have a BMP checked by his primary care provider.  At this point due to severe dehydration I am holding his diuresis.  He will need a BMP at the time of his primary care's visit.            Consults  None  Significant Tests:  See full reports for all details     Dg Chest 2 View  Result Date: 05/30/2019 CLINICAL DATA:  Cough with sinus drainage, shortness of breath. History of COPD/CHF. EXAM: CHEST - 2 VIEW COMPARISON:  None. FINDINGS: The heart is enlarged. There is mild aortic atherosclerosis. Linear opacities at both lung bases are most consistent with atelectasis or scarring. There is prominent extrapleural fat bilaterally, but no evidence pleural  effusion. There is no confluent airspace opacity, edema or pneumothorax. Mild degenerative changes are present within the thoracic spine. IMPRESSION: Cardiomegaly with mild bibasilar atelectasis or scarring. No edema or acute findings. Electronically Signed   By: Richardean Sale M.D.   On: 05/30/2019 15:02   Ct Abdomen Pelvis W Contrast  Result Date: 06/10/2019 CLINICAL DATA:  73 year old male with abdominal pain and nausea. EXAM: CT ABDOMEN AND PELVIS WITH CONTRAST TECHNIQUE: Multidetector CT imaging of the abdomen and pelvis was performed using the standard protocol following bolus administration of intravenous contrast. CONTRAST:  39mL OMNIPAQUE IOHEXOL 300 MG/ML  SOLN COMPARISON:  Abdomen MRI 10/20/2015. FINDINGS: Lower chest: Mediastinal lipomatosis. Mild scarring in the left lower lobe. No pericardial or pleural effusion. Hepatobiliary: Hepatic steatosis. Otherwise negative liver and gallbladder. Pancreas: Fatty atrophied but otherwise negative. Spleen: Negative; punctate calcified granuloma. Adrenals/Urinary Tract: Normal adrenal glands. Bilateral renal enhancement and contrast excretion is symmetric and normal. Decompressed renal collecting systems and proximal ureters. No nephrolithiasis. Negative ureters. Unremarkable urinary bladder. Stomach/Bowel: Negative descending and rectosigmoid colon. Negative transverse colon. Negative right colon and appendix (coronal image 61). Negative terminal ileum. No dilated small bowel. Negative stomach. No free air, free fluid, mesenteric stranding. Vascular/Lymphatic: Aortoiliac calcified atherosclerosis. Tortuous descending thoracic aorta. Major arterial structures are patent. Portal venous system is patent. No lymphadenopathy. Reproductive: Negative. Other: No pelvic free fluid. Musculoskeletal: Advanced disc and endplate degeneration in much of the lumbar spine. Grade 1 anterolisthesis at L4-L5 with multifactorial spinal stenosis. No acute osseous abnormality  identified. IMPRESSION: 1. No acute or inflammatory process identified in the abdomen or pelvis. Normal appendix. 2.  Hepatic steatosis. 3. 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 4.  Aortic Atherosclerosis (ICD10-I70.0). Electronically Signed   By: Genevie Ann M.D.   On: 06/10/2019 18:42   Dg Chest Portable 1 View  Result Date: 06/10/2019 CLINICAL DATA:  Chest pressure, abdominal pain, and nausea. EXAM: PORTABLE CHEST 1 VIEW COMPARISON:  05/30/2019 FINDINGS: The cardiac silhouette remains mildly enlarged. Aortic atherosclerosis is noted. Scarring is again seen in the left lung base. No acute airspace consolidation, overt pulmonary edema, sizable pleural effusion, pneumothorax is identified. No acute osseous abnormality is seen. IMPRESSION: No evidence of acute cardiopulmonary process. Electronically Signed   By: Logan Bores M.D.   On: 06/10/2019 18:30       Today   Subjective:   Joseph Macdonald patient doing much better no further diarrhea  Objective:   Blood pressure 110/80, pulse (!) 51, temperature 97.7 F (36.5 C), temperature source Oral, resp. rate 16, height 5\' 7"  (1.702 m), weight 102.9 kg, SpO2 90 %.  .  Intake/Output Summary (Last 24 hours) at 06/13/2019 1326 Last data filed at 06/13/2019 1054 Gross per 24 hour  Intake 1533.35 ml  Output 2050 ml  Net -516.65 ml    Exam VITAL SIGNS: Blood pressure 110/80, pulse (!) 51, temperature 97.7 F (36.5 C), temperature source Oral, resp. rate 16, height 5\' 7"  (1.702 m), weight 102.9 kg, SpO2 90 %.  GENERAL:  73 y.o.-year-old patient lying in the bed with no acute distress.  EYES: Pupils equal, round, reactive to light and accommodation. No scleral icterus. Extraocular muscles intact.  HEENT: Head atraumatic, normocephalic. Oropharynx and nasopharynx clear.  NECK:  Supple, no jugular venous distention. No thyroid enlargement, no tenderness.  LUNGS: Normal breath sounds bilaterally, no wheezing,  rales,rhonchi or crepitation. No use of accessory muscles of respiration.  CARDIOVASCULAR: S1, S2 normal. No murmurs, rubs, or gallops.  ABDOMEN: Soft, nontender, nondistended. Bowel sounds present. No organomegaly or mass.  EXTREMITIES: No pedal edema, cyanosis, or clubbing.  NEUROLOGIC: Cranial nerves II through XII are intact. Muscle strength 5/5 in all extremities. Sensation intact. Gait not checked.  PSYCHIATRIC: The patient is alert and oriented x 3.  SKIN: No obvious rash, lesion, or ulcer.   Data Review     CBC w Diff:  Lab Results  Component Value Date   WBC 13.0 (H) 06/13/2019   HGB 14.0 06/13/2019   HGB 15.5 04/14/2019   HCT 42.8 06/13/2019   HCT 45.3 04/14/2019   PLT 147 (L) 06/13/2019   PLT 169 04/14/2019   LYMPHOPCT 14 06/13/2019   MONOPCT 7 06/13/2019   EOSPCT 0 06/13/2019   BASOPCT 0 06/13/2019   CMP:  Lab Results  Component Value Date   NA 135 06/13/2019   NA 138 04/14/2019   K 2.9 (L) 06/13/2019   CL 87 (L) 06/13/2019   CO2 39 (H) 06/13/2019   BUN 24 (H) 06/13/2019   BUN 21 04/14/2019   CREATININE 1.22 06/13/2019   GLU 142 12/21/2015   PROT 7.6 06/10/2019   PROT 6.2 04/14/2019   ALBUMIN 4.4 06/10/2019   ALBUMIN 4.2 04/14/2019   BILITOT 1.9 (H) 06/10/2019   BILITOT 0.6 04/14/2019   ALKPHOS 47 06/10/2019   AST 45 (H) 06/10/2019   ALT 37 06/10/2019  .  Micro Results Recent Results (from the past 240 hour(s))  SARS Coronavirus 2 (CEPHEID- Performed in [place] hospital lab), Hosp Order     Status: None   Collection Time: 06/10/19  5:39 PM  Specimen: Nasopharyngeal Swab  Result Value Ref Range Status   SARS Coronavirus 2 NEGATIVE NEGATIVE Final    Comment: (NOTE) If result is NEGATIVE SARS-CoV-2 target nucleic acids are NOT DETECTED. The SARS-CoV-2 RNA is generally detectable in upper and lower  respiratory specimens during the acute phase of infection. The lowest  concentration of SARS-CoV-2 viral copies this assay can detect is 250   copies / mL. A negative result does not preclude SARS-CoV-2 infection  and should not be used as the sole basis for treatment or other  patient management decisions.  A negative result may occur with  improper specimen collection / handling, submission of specimen other  than nasopharyngeal swab, presence of viral mutation(s) within the  areas targeted by this assay, and inadequate number of viral copies  (<250 copies / mL). A negative result must be combined with clinical  observations, patient history, and epidemiological information. If result is POSITIVE SARS-CoV-2 target nucleic acids are DETECTED. The SARS-CoV-2 RNA is generally detectable in upper and lower  respiratory specimens dur ing the acute phase of infection.  Positive  results are indicative of active infection with SARS-CoV-2.  Clinical  correlation with patient history and other diagnostic information is  necessary to determine patient infection status.  Positive results do  not rule out bacterial infection or co-infection with other viruses. If result is PRESUMPTIVE POSTIVE SARS-CoV-2 nucleic acids MAY BE PRESENT.   A presumptive positive result was obtained on the submitted specimen  and confirmed on repeat testing.  While 2019 novel coronavirus  (SARS-CoV-2) nucleic acids may be present in the submitted sample  additional confirmatory testing may be necessary for epidemiological  and / or clinical management purposes  to differentiate between  SARS-CoV-2 and other Sarbecovirus currently known to infect humans.  If clinically indicated additional testing with an alternate test  methodology [id number]) is advised. The SARS-CoV-2 RNA is generally  detectable in upper and lower respiratory sp ecimens during the acute  phase of infection. The expected result is Negative. Fact Sheet for Patients:  StrictlyIdeas.no Fact Sheet for Healthcare  Providers: BankingDealers.co.za This test is not yet approved or cleared by the [place] FDA and has been authorized for detection and/or diagnosis of SARS-CoV-2 by FDA under an Emergency Use Authorization (EUA).  This EUA will remain in effect (meaning this test can be used) for the duration of the COVID-19 declaration under Section 564(b)(1) of the Act, 21 U.S.C. section 360bbb-3(b)(1), unless the authorization is terminated or revoked sooner. Performed at [facility], [address] [place], [place] [address]         Code Status Orders  (From admission, onward)         Start     Ordered   06/11/19 0332  Full code  Continuous     06/11/19 0331        Code Status History    This patient has a current code status but no historical code status.   Advance Care Planning Activity          Follow-up Information    [name], MD Follow up on 06/16/2019.   Specialty: Family Medicine Why: needs to have bmp and calcium check at time of visit Contact information: [address] [place] [address] [phone]        Nelva Bush, MD .   Specialty: Cardiology Contact information: [place] [address] [phone]  Discharge Medications   Allergies as of 06/13/2019   No Known Allergies     Medication List    STOP taking these medications   metolazone 5 MG tablet Commonly known as: ZAROXOLYN   torsemide 20 MG tablet Commonly known as: DEMADEX     TAKE these medications   albuterol 108 (90 Base) MCG/ACT inhaler Commonly known as: VENTOLIN HFA Inhale into the lungs every 6 (six) hours as needed for wheezing or shortness of breath.   amLODipine 5 MG tablet Commonly known as: NORVASC Take 1 tablet (5 mg total) by mouth daily.   amoxicillin-clavulanate 875-125 MG tablet Commonly known as: AUGMENTIN Take 1 tablet by mouth every 12 (twelve) hours for  5 days.   aspirin EC 81 MG tablet Take 81 mg by mouth daily.   famotidine 20 MG tablet Commonly known as: PEPCID Take 20 mg by mouth as needed for heartburn or indigestion.   gabapentin 300 MG capsule Commonly known as: NEURONTIN Take 600 mg by mouth at bedtime.   HYDROcodone-acetaminophen 5-325 MG tablet Commonly known as: NORCO/VICODIN Take 1 tablet by mouth every 6 (six) hours as needed for moderate pain.   omeprazole 40 MG capsule Commonly known as: PRILOSEC Take 40 mg by mouth daily.   Oscal 500/200 D-3 500-200 MG-UNIT tablet Generic drug: calcium-vitamin D Take 1 tablet by mouth 2 (two) times daily.   potassium chloride SA 20 MEQ tablet Commonly known as: K-DUR Take 2 tablets (40 mEq total) by mouth 2 (two) times daily for 5 days.   pramipexole 0.5 MG tablet Commonly known as: MIRAPEX Take 1 tablet (0.5 mg total) by mouth at bedtime.   pravastatin 20 MG tablet Commonly known as: PRAVACHOL Take 20 mg by mouth daily.   predniSONE 20 MG tablet Commonly known as: DELTASONE Take 40 mg by mouth daily with breakfast.   pyridostigmine 60 MG tablet Commonly known as: MESTINON Take 60 mg by mouth 4 (four) times daily.   tiotropium 18 MCG inhalation capsule Commonly known as: SPIRIVA Place 18 mcg into inhaler and inhale daily.   vitamin B-12 1000 MCG tablet Commonly known as: CYANOCOBALAMIN Take 1,000 mcg by mouth daily.          Total Time in preparing paper work, data evaluation and todays exam - 7 minutes  Dustin Flock M.D on 06/13/2019 at [place]  [phone]

## 2019-06-16 ENCOUNTER — Telehealth: Payer: Self-pay

## 2019-06-16 ENCOUNTER — Ambulatory Visit (INDEPENDENT_AMBULATORY_CARE_PROVIDER_SITE_OTHER): Payer: PPO | Admitting: Family Medicine

## 2019-06-16 ENCOUNTER — Encounter: Payer: Self-pay | Admitting: Family Medicine

## 2019-06-16 VITALS — BP 127/72 | HR 58 | Temp 97.6°F | Wt 229.0 lb

## 2019-06-16 DIAGNOSIS — E876 Hypokalemia: Secondary | ICD-10-CM | POA: Diagnosis not present

## 2019-06-16 DIAGNOSIS — R0600 Dyspnea, unspecified: Secondary | ICD-10-CM | POA: Diagnosis not present

## 2019-06-16 DIAGNOSIS — M9979 Connective tissue and disc stenosis of intervertebral foramina of abdomen and other regions: Secondary | ICD-10-CM | POA: Diagnosis not present

## 2019-06-16 DIAGNOSIS — R197 Diarrhea, unspecified: Secondary | ICD-10-CM

## 2019-06-16 DIAGNOSIS — I5032 Chronic diastolic (congestive) heart failure: Secondary | ICD-10-CM | POA: Diagnosis not present

## 2019-06-16 LAB — VITAMIN D 25 HYDROXY (VIT D DEFICIENCY, FRACTURES): Vit D, 25-Hydroxy: 12.6 ng/mL — ABNORMAL LOW (ref 30.0–100.0)

## 2019-06-16 MED ORDER — HYDROCODONE-ACETAMINOPHEN 5-325 MG PO TABS: 1.0000 | ORAL_TABLET | Freq: Four times a day (QID) | ORAL | 0 refills | Status: DC | PRN

## 2019-06-16 MED ORDER — BUDESONIDE-FORMOTEROL FUMARATE 160-4.5 MCG/ACT IN AERO: 2.0000 | INHALATION_SPRAY | Freq: Two times a day (BID) | RESPIRATORY_TRACT | 0 refills | Status: AC

## 2019-06-16 NOTE — Telephone Encounter (Signed)
No HFU scheduled.  

## 2019-06-16 NOTE — Patient Instructions (Signed)
.   Please review the attached list of medications and notify my office if there are any errors.   . Please bring all of your medications to every appointment so we can make sure that our medication list is the same as yours.   . We will have flu vaccines available after Labor Day. Please go to your pharmacy or call the office in early September to schedule you flu shot.   

## 2019-06-16 NOTE — Progress Notes (Signed)
Patient: Joseph Macdonald Male    DOB: [birth year]   73 y.o.   MRN: [id number] Visit Date: 06/16/2019  Today's Provider: Lelon Huh, MD   Chief Complaint  Patient presents with  . Hospitalization Follow-up  . Dehydration   Subjective:     HPI    Follow up Hospitalization  Patient was admitted to [facility] on 06/10/2019 and discharged on 06/13/2019. He was treated for Hypokalemia, Hyponatremia, and Hypoxia. He had previously been seen for worsening LE edema and was not able to be seen at [place] due to Covid-19 pandemic. Metolazone was added to his regiment which he initially tolerated well, however he subsequently developed persistent diarrhea leading to extreme weakness and fatigue, and leg cramping, which led him to present to [facility] ER as above.  Treatment for this included IV fluids and potassium replacement. Was also discharged on Augmentin, He reports diarrhea has resolved, no nausea or vomiting. Is tolerating oral potassium well. He was able to get appointment to establish with Dr. Rockey Situ on 07/08/2019 Telephone follow up was done on 06/16/2019 He reports excellent compliance with treatment. He reports this condition is Improved.  ------------------------------------------------------------------------------------     No Known Allergies   Current Outpatient Medications:  .  albuterol (PROVENTIL HFA;VENTOLIN HFA) 108 (90 Base) MCG/ACT inhaler, Inhale into the lungs every 6 (six) hours as needed for wheezing or shortness of breath., Disp: , Rfl:  .  amoxicillin-clavulanate (AUGMENTIN) 875-125 MG tablet, Take 1 tablet by mouth every 12 (twelve) hours for 5 days., Disp: 10 tablet, Rfl: 0 .  aspirin EC 81 MG tablet, Take 81 mg by mouth daily., Disp: , Rfl:  .  calcium-vitamin D (OSCAL 500/200 D-3) 500-200 MG-UNIT tablet, Take 1 tablet by mouth 2 (two) times daily., Disp: 180 tablet, Rfl: 3 .  famotidine (PEPCID) 20 MG tablet, Take 20 mg by mouth as needed for heartburn or  indigestion., Disp: , Rfl:  .  gabapentin (NEURONTIN) 300 MG capsule, Take 600 mg by mouth at bedtime., Disp: , Rfl:  .  HYDROcodone-acetaminophen (NORCO/VICODIN) 5-325 MG tablet, Take 1 tablet by mouth every 6 (six) hours as needed for moderate pain., Disp: 120 tablet, Rfl: 0 .  mycophenolate (CELLCEPT) 250 MG capsule, Take 1,500 mg by mouth daily., Disp: , Rfl:  .  omeprazole (PRILOSEC) 40 MG capsule, Take 40 mg by mouth daily., Disp: , Rfl:  .  potassium chloride SA (K-DUR) 20 MEQ tablet, Take 2 tablets (40 mEq total) by mouth 2 (two) times daily for 5 days., Disp: 20 tablet, Rfl: 0 .  pramipexole (MIRAPEX) 0.5 MG tablet, Take 1 tablet (0.5 mg total) by mouth at bedtime., Disp: 30 tablet, Rfl: 11 .  pravastatin (PRAVACHOL) 20 MG tablet, Take 20 mg by mouth daily., Disp: , Rfl:  .  predniSONE (DELTASONE) 20 MG tablet, Take 40 mg by mouth daily with breakfast. , Disp: , Rfl:  .  pyridostigmine (MESTINON) 60 MG tablet, Take 60 mg by mouth 4 (four) times daily. , Disp: , Rfl:  .  tiotropium (SPIRIVA) 18 MCG inhalation capsule, Place 18 mcg into inhaler and inhale daily., Disp: , Rfl:  .  vitamin B-12 (CYANOCOBALAMIN) 1000 MCG tablet, Take 1,000 mcg by mouth daily., Disp: , Rfl:  .  amLODipine (NORVASC) 5 MG tablet, Take 1 tablet (5 mg total) by mouth daily., Disp: 90 tablet, Rfl: 3  Review of Systems  Constitutional: Negative.   Respiratory: Positive for chest tightness, shortness of breath and wheezing. Negative  for apnea, cough, choking and stridor.   Cardiovascular: Positive for leg swelling. Negative for chest pain and palpitations.  Gastrointestinal: Negative.   Neurological: Positive for light-headedness. Negative for dizziness and headaches.    Social History   Tobacco Use  . Smoking status: Former Smoker    Packs/day: 0.50    Years: 40.00    Pack years: 20.00    Types: Pipe, Cigarettes    Quit date: 08/19/2017    Years since quitting: 1.8  . Smokeless tobacco: Former Systems developer  .  Tobacco comment: previously smoked 1 1/2 pack since his 73 years old  Substance Use Topics  . Alcohol use: Yes    Comment: occasionally      Objective:   BP 127/72 (BP Location: Left Arm, Patient Position: Sitting, Cuff Size: Normal)   Pulse (!) 58   Temp 97.6 F (36.4 C) (Oral)   Wt 229 lb (103.9 kg)   SpO2 96%   BMI 35.87 kg/m  Vitals:   06/16/19 1346  BP: 127/72  Pulse: (!) 58  Temp: 97.6 F (36.4 C)  TempSrc: Oral  SpO2: 96%  Weight: 229 lb (103.9 kg)     Physical Exam    General Appearance:    Alert, cooperative, no distress  Eyes:    PERRL, conjunctiva/corneas clear, EOM's intact       Lungs:     Clear to auscultation bilaterally, respirations unlabored  Heart:    Regular rate and rhythm  Neurologic:   Awake, alert, oriented x 3. No apparent focal neurological           defect.           Assessment & Plan    1. Hypokalemia Multifactorial, including increased diuresis and GI potassium losses from viral gastroenteritis which has now resolved. Is tolerating potassium supplements well and is now off of diuretics.  - Comprehensive metabolic panel - Magnesium  2. Dyspnea, unspecified type Likely secondary to CHF which may be flaring since having to stop diuretics.  - CBC  3. Narrowing of intervertebral disc space refill - HYDROcodone-acetaminophen (NORCO/VICODIN) 5-325 MG tablet; Take 1 tablet by mouth every 6 (six) hours as needed for moderate pain.  Dispense: 120 tablet; Refill: 0  4. Diarrhea, unspecified type Likely viral, now resolved, advised that this was likely a significant contrib uter to potassium loss and to call if it returns.   5. Chronic heart failure with preserved ejection fraction (HFpEF) ([facility]) Has not been able to get back into [facility] cardiology clinic for cardiac despite worsening edema and dyspnea the last few months. Is scheduled to establish with Dr. Rockey Situ later this month.   The entirety of the information documented in the History of  Present Illness, Review of Systems and Physical Exam were personally obtained by me. Portions of this information were initially documented by Ashley Royalty, CMA and reviewed by me for thoroughness and accuracy.      Lelon Huh, MD  [facility]

## 2019-06-16 NOTE — Telephone Encounter (Signed)
Transition Care Management Follow-up Telephone Call  Date of discharge and from where: [facility] on 06/13/19  How have you been since you were released from the hospital? Still weak and gets SOB easy. Declines pain, fatigue, fever or n/v/d. Appetite has increased to normal. Still not sleeping well but wife states he has had that problem. Pt has not seen improvement with breathing while using the inhalers.   Any questions or concerns? Yes, still concerned about his breathing. Pt cannot catch his breath at times and then he goes into a panic attack.   Items Reviewed:  Did the pt receive and understand the discharge instructions provided? Yes   Medications obtained and verified? Declines, will review at [place] apt this afternoon.   Any new allergies since your discharge? No   Dietary orders reviewed? Yes  Do you have support at home? Yes   Other (ie: DME, Home Health, etc) N/A  Functional Questionnaire: (I = Independent and D = Dependent)  Bathing/Dressing- I   Meal Prep- I  Eating- I  Maintaining continence- Bowel leakage occasionally.  Transferring/Ambulation- Uses a cane or walker when pt leaves the home for a long period of time.   Managing Meds- I   Follow up appointments reviewed:    PCP Hospital f/u appt confirmed? Yes  Scheduled to see Dr Caryn Section on 06/16/19 @ 2:00 PM.  [facility] f/u appt confirmed? Yes   Are transportation arrangements needed? No   If their condition worsens, is the pt aware to call  their PCP or go to the ED? Yes  Was the patient provided with contact information for the PCP's office or ED? Yes  Was the pt encouraged to call back with questions or concerns? Yes

## 2019-06-17 ENCOUNTER — Encounter: Payer: Self-pay | Admitting: Family Medicine

## 2019-06-17 ENCOUNTER — Telehealth: Payer: Self-pay

## 2019-06-17 DIAGNOSIS — E559 Vitamin D deficiency, unspecified: Secondary | ICD-10-CM | POA: Insufficient documentation

## 2019-06-17 LAB — COMPREHENSIVE METABOLIC PANEL
ALT: 39 IU/L (ref 0–44)
AST: 20 IU/L (ref 0–40)
Albumin/Globulin Ratio: 2.2 (ref 1.2–2.2)
Albumin: 3.9 g/dL (ref 3.7–4.7)
Alkaline Phosphatase: 37 IU/L — ABNORMAL LOW (ref 39–117)
BUN/Creatinine Ratio: 21 (ref 10–24)
BUN: 20 mg/dL (ref 8–27)
Bilirubin Total: 0.4 mg/dL (ref 0.0–1.2)
CO2: 23 mmol/L (ref 20–29)
Calcium: 8.5 mg/dL — ABNORMAL LOW (ref 8.6–10.2)
Chloride: 100 mmol/L (ref 96–106)
Creatinine, Ser: 0.94 mg/dL (ref 0.76–1.27)
GFR calc Af Amer: 93 mL/min/{1.73_m2} (ref >59)
GFR calc non Af Amer: 81 mL/min/{1.73_m2} (ref >59)
Globulin, Total: 1.8 g/dL (ref 1.5–4.5)
Glucose: 198 mg/dL — ABNORMAL HIGH (ref 65–99)
Potassium: 5.7 mmol/L — ABNORMAL HIGH (ref 3.5–5.2)
Sodium: 138 mmol/L (ref 134–144)
Total Protein: 5.7 g/dL — ABNORMAL LOW (ref 6.0–8.5)

## 2019-06-17 LAB — CBC
Hematocrit: 42.1 % (ref 37.5–51.0)
Hemoglobin: 14.2 g/dL (ref 13.0–17.7)
MCH: 31 pg (ref 26.6–33.0)
MCHC: 33.7 g/dL (ref 31.5–35.7)
MCV: 92 fL (ref 79–97)
Platelets: 206 10*3/uL (ref 150–450)
RBC: 4.58 x10E6/uL (ref 4.14–5.80)
RDW: 14.1 % (ref 11.6–15.4)
WBC: 10.4 10*3/uL (ref 3.4–10.8)

## 2019-06-17 LAB — MAGNESIUM: Magnesium: 1.8 mg/dL (ref 1.6–2.3)

## 2019-06-17 NOTE — Telephone Encounter (Signed)
-----   Message from [name], MD sent at 06/17/2019  9:28 AM EDT ----- Potassium is too high and kidney functions are back to normal.  He can cut potassium tablet back to one daily and resume furosemide, but only take one of those daily.  His potassium was low in the hospital due to a combination of issues, but was probably the diarrhea that tipped him over, so be sure to let me know if he develops diarrhea again. otherwise will need to recheck potassium levels in 7-10 days. Will call him next week when its time to recheck it.

## 2019-06-17 NOTE — Telephone Encounter (Signed)
Pt advised.   Thanks,   -Laura  

## 2019-06-25 ENCOUNTER — Telehealth: Payer: Self-pay | Admitting: Family Medicine

## 2019-06-25 DIAGNOSIS — E875 Hyperkalemia: Secondary | ICD-10-CM

## 2019-06-25 NOTE — Telephone Encounter (Signed)
Please advise time to recheck potassium since it was so high last week. Please leave order at lab. Can come by on Thursday or Friday.

## 2019-06-26 NOTE — Telephone Encounter (Signed)
Pt advised.   Thanks,   -Jameelah Watts  

## 2019-06-27 DIAGNOSIS — E875 Hyperkalemia: Secondary | ICD-10-CM | POA: Diagnosis not present

## 2019-06-28 LAB — POTASSIUM: Potassium: 4.2 mmol/L (ref 3.5–5.2)

## 2019-06-30 ENCOUNTER — Telehealth: Payer: Self-pay

## 2019-06-30 NOTE — Telephone Encounter (Signed)
-----   Message from [name], MD sent at 06/28/2019  8:38 PM EDT ----- Potassium level is normal. Continue current  Potassium supplement and furosemide. And follow up with cardiology as scheduled next week.

## 2019-06-30 NOTE — Telephone Encounter (Signed)
Patient advised.KW 

## 2019-07-07 ENCOUNTER — Telehealth: Payer: Self-pay | Admitting: Cardiovascular Disease

## 2019-07-07 NOTE — Progress Notes (Signed)
Cardiology Office Note  Date:  07/08/2019   ID:  Joseph Macdonald, DOB 01-Mar-1946, MRN [id number]  PCP:  [name], MD   Chief Complaint  Patient presents with  . Other    2 month follow up. patient c/o SOB and swelling in ankles. Meds reviewed verbally with patient.     HPI:  Joseph Macdonald is a 73 y.o. male with history of  sustained VT,  myasthenia gravis with myasthenia crisis requiring plasma exchange at [facility] in 12/2018,  HFpEF,  HTN,  smoking,  HLD  who presents for follow up of HFpEF.  Low potassium 2.9 in 06/12/2019 Now potassium 4.2 Low potassium in the setting of diarrhea and on Lasix Now with periodic diarrhea, bad several weeks ago  Chronic left leg swelling, left leg predominantly Weight continues to run high Chronic SOB  REDS Vest 30 today in the office, done for leg edema and SOB  Denies any chest pain, no tachycardia concerning for arrhythmia  Outside records reviewed Records have also been requested for our system  [facility]  12/2018 for marked weakness consistent with myasthenia crisis. He underwent plasma exchange and was noted to have episodes of NSVT.    Holter monitoring,asymptomatic WCT.  readmitted to the hospital and started on amiodarone.  Cardiac workup including echo, LHC, and cardiac MRI showed no evidence of cardiomyopathy or significant CAD.    Dr. Saunders Revel in 03/2019 increase in exertional dyspnea and lower extremity swelling over the past several months  15 pound weight gain over the prior 3-4 months.   started on Lasix 40 mg daily x 1 week (with reduction in HCTZ).   referred to EP.  stable weight around 220-222 pounds (down 11 pounds over the prior month).    [place]  increased lower extremity swelling. Lasix to 40 mg bid  abdominal distension with associated early satiety and now 2-pillow orthopnea (baseline 1-pillow).   dry weight around 223-225 pounds dating back to 11/2018,   though since his admission to [place] in 12/2018  his weight has hovered around 232 pounds.  Seen by Dr. Caryl Comes June 2020 amiodarone dosing decreased,  TSH    PMH:   has a past medical history of Hyperlipidemia, Hypertension, Myasthenia gravis ([place]), and Ventricular tachycardia ([place]).  PSH:    Past Surgical History:  Procedure Laterality Date  . carpel tunnel sx    . CATARACT EXTRACTION     right eye  . SKIN GRAFT     Behind left knee  . TONSILLECTOMY AND ADENOIDECTOMY      Current Outpatient Medications  Medication Sig Dispense Refill  . albuterol (PROVENTIL HFA;VENTOLIN HFA) 108 (90 Base) MCG/ACT inhaler Inhale into the lungs every 6 (six) hours as needed for wheezing or shortness of breath.    Marland Kitchen aspirin EC 81 MG tablet Take 81 mg by mouth daily.    . budesonide-formoterol (SYMBICORT) 160-4.5 MCG/ACT inhaler Inhale 2 puffs into the lungs 2 (two) times daily. 1 Inhaler 0  . calcium-vitamin D (OSCAL 500/200 D-3) 500-200 MG-UNIT tablet Take 1 tablet by mouth 2 (two) times daily. 180 tablet 3  . famotidine (PEPCID) 20 MG tablet Take 20 mg by mouth as needed for heartburn or indigestion.    . flecainide (TAMBOCOR) 50 MG tablet Take 50 mg by mouth 2 (two) times daily.    Marland Kitchen gabapentin (NEURONTIN) 300 MG capsule Take 600 mg by mouth at bedtime.    Marland Kitchen HYDROcodone-acetaminophen (NORCO/VICODIN) 5-325 MG tablet Take 1 tablet by  mouth every 6 (six) hours as needed for moderate pain. 120 tablet 0  . mycophenolate (CELLCEPT) 250 MG capsule Take 1,500 mg by mouth daily.    Marland Kitchen omeprazole (PRILOSEC) 40 MG capsule Take 40 mg by mouth daily.    . pravastatin (PRAVACHOL) 20 MG tablet Take 20 mg by mouth daily.    . predniSONE (DELTASONE) 20 MG tablet Take 40 mg by mouth daily with breakfast.     . pyridostigmine (MESTINON) 60 MG tablet Take 60 mg by mouth 4 (four) times daily.     Marland Kitchen tiotropium (SPIRIVA) 18 MCG inhalation capsule Place 18 mcg into inhaler and inhale daily.    . vitamin B-12 (CYANOCOBALAMIN) 1000 MCG tablet Take 1,000 mcg by mouth daily.     Marland Kitchen amLODipine (NORVASC) 5 MG tablet Take 1 tablet (5 mg total) by mouth daily. 90 tablet 3  . potassium chloride SA (K-DUR) 20 MEQ tablet Take 2 tablets (40 mEq total) by mouth 2 (two) times daily for 5 days. 20 tablet 0   No current facility-administered medications for this visit.      Allergies:   Patient has no known allergies.   Social History:  The patient  reports that he quit smoking about 22 months ago. His smoking use included pipe and cigarettes. He has a 20.00 pack-year smoking history. He has quit using smokeless tobacco. He reports current alcohol use. He reports that he does not use drugs.   Family History:   family history includes Arthritis in his father, mother, and another family member; Heart attack in his father; Heart disease (age of onset: 16) in his father; Hypertension in his mother; Kidney disease in his mother; Stroke in his father; Thyroid disease in his mother.    Review of Systems: Review of Systems  Constitutional: Negative.   HENT: Negative.   Respiratory: Positive for shortness of breath.   Cardiovascular:       Left leg swelling pitting  Gastrointestinal: Negative.   Musculoskeletal: Negative.   Neurological: Negative.   Psychiatric/Behavioral: Negative.   All other systems reviewed and are negative.    PHYSICAL EXAM: VS:  BP 132/70 (BP Location: Left Arm, Patient Position: Sitting, Cuff Size: Normal)   Pulse 61   Ht 5\' 7"  (1.702 m)   Wt 225 lb (102.1 kg)   BMI 35.24 kg/m  , BMI Body mass index is 35.24 kg/m. GEN: Well nourished, well developed, in no acute distress HEENT: normal Neck: no JVD, carotid bruits, or masses Cardiac: RRR; no murmurs, rubs, or gallops, 1+ pitting edema left lower extremity to below the knees edema  Respiratory:  clear to auscultation bilaterally, normal work of breathing GI: soft, nontender, nondistended, + BS MS: no deformity or atrophy Skin: warm and dry, no rash Neuro:  Strength and sensation are  intact Psych: euthymic mood, full affect   Recent Labs: 06/10/2019: B Natriuretic Peptide 144.0; TSH 0.235 06/16/2019: ALT 39; BUN 20; Creatinine, Ser 0.94; Hemoglobin 14.2; Magnesium 1.8; Platelets 206; Sodium 138 06/27/2019: Potassium 4.2    Lipid Panel Lab Results  Component Value Date   CHOL 171 12/21/2015   HDL 89 (A) 12/21/2015      Wt Readings from Last 3 Encounters:  07/08/19 225 lb (102.1 kg)  06/16/19 229 lb (103.9 kg)  06/13/19 226 lb 12.8 oz (102.9 kg)      ASSESSMENT AND PLAN:  Problem List Items Addressed This Visit      Cardiology Problems   Ventricular tachycardia ([place]) - Primary  Relevant Medications   flecainide (TAMBOCOR) 50 MG tablet   Essential hypertension   Relevant Medications   flecainide (TAMBOCOR) 50 MG tablet     Other   Myasthenia gravis ([place])    Other Visit Diagnoses    Acute on chronic diastolic CHF (congestive heart failure) (HCC)       Relevant Medications   flecainide (TAMBOCOR) 50 MG tablet   Coronary artery disease, non-occlusive       Relevant Medications   flecainide (TAMBOCOR) 50 MG tablet   Dysphagia, unspecified type       Leg swelling       Relevant Orders   VAS [place] LOWER EXTREMITY VENOUS (DVT)     Lower extremity edema left leg We have ordered venous Doppler this trip to the office Doppler came back negative for DVT bilaterally Likely venous related, recommend compression hose  Shortness of breath REDS VEST score is 30 This was performed for shortness of breath leg swelling This would indicate shortness of breath less likely from congestive heart failure pulmonary edema We will not restart Lasix with potassium He does have long smoking history likely COPD and is very deconditioned, weight is markedly high  Morbid obesity We have encouraged continued exercise, careful diet management in an effort to lose weight.  Myasthenia gravis On prednisone Wife reports stable symptoms  Sustained VT Followed by Dr.  Caryl Comes No recent near syncope, syncope, palpitations  Disposition:   F/U  12 months  Numerous issues covered today including leg swelling, venous Doppler ordered, reds vest measurement  performed in the office for work-up for shortness of breath  Total encounter time more than 45 minutes  Greater than 50% was spent in counseling and coordination of care with the patient   Signed, Esmond Plants, M.D., Ph.D. [place], [place]

## 2019-07-07 NOTE — Telephone Encounter (Signed)

## 2019-07-08 ENCOUNTER — Ambulatory Visit (INDEPENDENT_AMBULATORY_CARE_PROVIDER_SITE_OTHER): Payer: PPO

## 2019-07-08 ENCOUNTER — Encounter: Payer: Self-pay | Admitting: Cardiovascular Disease

## 2019-07-08 ENCOUNTER — Other Ambulatory Visit: Payer: Self-pay

## 2019-07-08 ENCOUNTER — Ambulatory Visit (INDEPENDENT_AMBULATORY_CARE_PROVIDER_SITE_OTHER): Payer: PPO | Admitting: Cardiovascular Disease

## 2019-07-08 VITALS — BP 132/70 | HR 61 | Ht 67.0 in | Wt 225.0 lb

## 2019-07-08 DIAGNOSIS — M7989 Other specified soft tissue disorders: Secondary | ICD-10-CM

## 2019-07-08 DIAGNOSIS — I472 Ventricular tachycardia, unspecified: Secondary | ICD-10-CM

## 2019-07-08 DIAGNOSIS — I10 Essential (primary) hypertension: Secondary | ICD-10-CM | POA: Diagnosis not present

## 2019-07-08 DIAGNOSIS — I2510 Atherosclerotic heart disease of native coronary artery without angina pectoris: Secondary | ICD-10-CM | POA: Diagnosis not present

## 2019-07-08 DIAGNOSIS — G7000 Myasthenia gravis without (acute) exacerbation: Secondary | ICD-10-CM

## 2019-07-08 DIAGNOSIS — I5033 Acute on chronic diastolic (congestive) heart failure: Secondary | ICD-10-CM | POA: Diagnosis not present

## 2019-07-08 DIAGNOSIS — R1310 Dysphagia, unspecified: Secondary | ICD-10-CM

## 2019-07-08 NOTE — Patient Instructions (Addendum)
Left LE venous doppler for leg swelling, rule out DVt  Records from [place], echo?  Reds vest today, score is 30, good   Medication Instructions:  No changes  If you need a refill on your cardiac medications before your next appointment, please call your pharmacy.    Lab work: No new labs needed   If you have labs (blood work) drawn today and your tests are completely normal, you will receive your results only by: Marland Kitchen MyChart Message (if you have MyChart) OR . A paper copy in the mail If you have any lab test that is abnormal or we need to change your treatment, we will call you to review the results.   Testing/Procedures: Your physician has requested that you have a lower or upper extremity venous duplex. This test is an ultrasound of the veins in the legs or arms. It looks at venous blood flow that carries blood from the heart to the legs or arms. Allow one hour for a Lower Venous exam. Allow thirty minutes for an Upper Venous exam. There are no restrictions or special instructions.   Follow-Up: At [facility], you and your health needs are our priority.  As part of our continuing mission to provide you with exceptional heart care, we have created designated Provider Care Teams.  These Care Teams include your primary Cardiologist (physician) and Advanced Practice Providers (APPs -  Physician Assistants and Nurse Practitioners) who all work together to provide you with the care you need, when you need it.  . You will need a follow up appointment in 6 months .   Please call our office 2 months in advance to schedule this appointment.    . Providers on your designated Care Team:   . Murray Hodgkins, NP . Christell Faith, PA-C . Marrianne Mood, PA-C  Any Other Special Instructions Will Be Listed Below (If Applicable).  For educational health videos Log in to : [email or web address] Or : [email or web address], password : [email or web address]

## 2019-07-21 ENCOUNTER — Other Ambulatory Visit: Payer: Self-pay | Admitting: Family Medicine

## 2019-07-21 DIAGNOSIS — M9979 Connective tissue and disc stenosis of intervertebral foramina of abdomen and other regions: Secondary | ICD-10-CM

## 2019-07-21 MED ORDER — HYDROCODONE-ACETAMINOPHEN 5-325 MG PO TABS: 1.0000 | ORAL_TABLET | Freq: Four times a day (QID) | ORAL | 0 refills | Status: DC | PRN

## 2019-08-03 ENCOUNTER — Other Ambulatory Visit: Payer: Self-pay | Admitting: Internal Medicine

## 2019-08-04 NOTE — Telephone Encounter (Signed)
Please review for refill. I see the Furosemide was discontinued but there is a refill pending for Furosemide. Please review and refill if patient is to continue. Thanks!

## 2019-08-07 NOTE — Progress Notes (Signed)
Patient: Joseph Macdonald Male    DOB: 31-Mar-1946   73 y.o.   MRN: [id number] Visit Date: 08/08/2019  Today's Provider: Trinna Post, PA-C   Chief Complaint  Patient presents with  . Knee Pain  . Fall   Subjective:     Fall The accident occurred 5 to 7 days ago (5 days ago). The fall occurred while walking. He fell from a height of 3 to 5 ft. He landed on carpet. There was no blood loss. The point of impact was the left shoulder and left knee. The pain is present in the left knee, left lower leg and left foot. The pain is moderate. The symptoms are aggravated by ambulation, standing and movement. Pertinent negatives include no abdominal pain, bowel incontinence, fever, headaches, hearing loss, hematuria, loss of consciousness, nausea, numbness, tingling, visual change or vomiting. He has tried acetaminophen, elevation and ice for the symptoms. The treatment provided mild relief.     Patent tripped over a rug and fell 5 days ago. Patient fell and landed on his left knee and left shoulder. Patient left knee and lower leg is bruised and swollen. Patient states left left in tender to walk on. Patient has been taking prescribed medications and using elevation for treatment with mild relief. He is taking ASA 81 mg daily but no other blood thinners or antiplatelet medications.   Patient has a history of CHF and COPD. His dry weight is approximately 225 lbs and today he weights 236. He reports earlier this week he was 230-232 lbs. He reports he has swelling in his legs that was present prior to the injury. He reports his face is swollen. He is not SOB above his baseline. He has been sleeping in his recliner. He is seen at the [place] in addition to this clinic and so he was taking 40 mg lasix daily without potassium supplementation up until 08/04/2019. He reported this caused nausea and so a provider at the [place] told him to stop the fluid pill.   Wt Readings from Last 3 Encounters:  08/08/19 236  lb (107 kg)  07/08/19 225 lb (102.1 kg)  06/16/19 229 lb (103.9 kg)     No Known Allergies   Current Outpatient Medications:  .  albuterol (PROVENTIL HFA;VENTOLIN HFA) 108 (90 Base) MCG/ACT inhaler, Inhale into the lungs every 6 (six) hours as needed for wheezing or shortness of breath., Disp: , Rfl:  .  amLODipine (NORVASC) 5 MG tablet, Take 1 tablet (5 mg total) by mouth daily., Disp: 90 tablet, Rfl: 3 .  aspirin EC 81 MG tablet, Take 81 mg by mouth daily., Disp: , Rfl:  .  budesonide-formoterol (SYMBICORT) 160-4.5 MCG/ACT inhaler, Inhale 2 puffs into the lungs 2 (two) times daily., Disp: 1 Inhaler, Rfl: 0 .  calcium-vitamin D (OSCAL 500/200 D-3) 500-200 MG-UNIT tablet, Take 1 tablet by mouth 2 (two) times daily., Disp: 180 tablet, Rfl: 3 .  famotidine (PEPCID) 20 MG tablet, Take 20 mg by mouth as needed for heartburn or indigestion., Disp: , Rfl:  .  flecainide (TAMBOCOR) 50 MG tablet, Take 50 mg by mouth 2 (two) times daily., Disp: , Rfl:  .  furosemide (LASIX) 40 MG tablet, TAKE ONE TABLET BY MOUTH DAILY UNTIL SWELLING/BREATHING RETURN TO NORMAL - THEN TAKE AS NEEDED FOR SWELLING/WEIGHT GAIN, Disp: 90 tablet, Rfl: 3 .  gabapentin (NEURONTIN) 300 MG capsule, Take 600 mg by mouth at bedtime., Disp: , Rfl:  .  HYDROcodone-acetaminophen (  NORCO/VICODIN) 5-325 MG tablet, Take 1 tablet by mouth every 6 (six) hours as needed for moderate pain., Disp: 120 tablet, Rfl: 0 .  mycophenolate (CELLCEPT) 250 MG capsule, Take 1,500 mg by mouth daily., Disp: , Rfl:  .  omeprazole (PRILOSEC) 40 MG capsule, Take 40 mg by mouth daily., Disp: , Rfl:  .  potassium chloride SA (K-DUR) 20 MEQ tablet, Take 2 tablets (40 mEq total) by mouth 2 (two) times daily for 5 days., Disp: 20 tablet, Rfl: 0 .  pravastatin (PRAVACHOL) 20 MG tablet, Take 20 mg by mouth daily., Disp: , Rfl:  .  predniSONE (DELTASONE) 20 MG tablet, Take 40 mg by mouth daily with breakfast. , Disp: , Rfl:  .  pyridostigmine (MESTINON) 60 MG  tablet, Take 60 mg by mouth 4 (four) times daily. , Disp: , Rfl:  .  tiotropium (SPIRIVA) 18 MCG inhalation capsule, Place 18 mcg into inhaler and inhale daily., Disp: , Rfl:  .  vitamin B-12 (CYANOCOBALAMIN) 1000 MCG tablet, Take 1,000 mcg by mouth daily., Disp: , Rfl:   Review of Systems  Constitutional: Negative for appetite change, chills and fever.  Respiratory: Negative for chest tightness, shortness of breath and wheezing.   Cardiovascular: Negative for chest pain and palpitations.  Gastrointestinal: Negative for abdominal pain, bowel incontinence, nausea and vomiting.  Genitourinary: Negative for hematuria.  Neurological: Negative for tingling, loss of consciousness, numbness and headaches.    Social History   Tobacco Use  . Smoking status: Former Smoker    Packs/day: 0.50    Years: 40.00    Pack years: 20.00    Types: Pipe, Cigarettes    Quit date: 08/19/2017    Years since quitting: 1.9  . Smokeless tobacco: Former Systems developer  . Tobacco comment: previously smoked 1 1/2 pack since his 73 years old  Substance Use Topics  . Alcohol use: Yes    Comment: occasionally      Objective:   BP 131/74 (BP Location: Left Arm, Patient Position: Sitting, Cuff Size: Large)   Pulse 61   Temp (!) 96.9 F (36.1 C) (Other (Comment))   Resp 16   Ht 5\' 7"  (1.702 m)   Wt 236 lb (107 kg)   SpO2 94%   BMI 36.96 kg/m  Vitals:   08/08/19 1447  BP: 131/74  Pulse: 61  Resp: 16  Temp: (!) 96.9 F (36.1 C)  TempSrc: Other (Comment)  SpO2: 94%  Weight: 236 lb (107 kg)  Height: 5\' 7"  (1.702 m)     Physical Exam Constitutional:      Appearance: Normal appearance.  Cardiovascular:     Rate and Rhythm: Normal rate and regular rhythm.     Heart sounds: Normal heart sounds.     Comments: 2+ pitting edema until slightly inferior to knee Pulmonary:     Effort: Pulmonary effort is normal. No respiratory distress.     Breath sounds: Wheezing present. No rales.  Musculoskeletal:     Left  knee: Normal.     Right lower leg: 2+ Pitting Edema present.     Left lower leg: 2+ Pitting Edema present.     Comments: Left knee with significant ecchymoses but otherwise normal. Ecchymoses extends down anterior left lower extremity.   Skin:    General: Skin is warm and dry.     Findings: Bruising present.  Neurological:     Mental Status: He is alert and oriented to person, place, and time. Mental status is at baseline.  Psychiatric:  Mood and Affect: Mood normal.        Behavior: Behavior normal.      No results found for any visits on 08/08/19.     Assessment & Plan    1. Chronic heart failure with preserved ejection fraction (HFpEF) ([place])  He has not taken Lasix in 4 days and he is 11 pounds above his dry weight. He is not in obvious respiratory distress but he does appear fluid overloaded on exam. Will have him restart Lasix 40 mg daily along with 20 mEq of potassium daily. I have counseled him on return precautions and scheduled him for follow up on Monday 08/11/2019 with Dr. Caryn Section.   2. Acute pain of left knee  Will check xray as below.  - DG Knee Complete 4 Views Left; Future  The entirety of the information documented in the History of Present Illness, Review of Systems and Physical Exam were personally obtained by me. Portions of this information were initially documented by April M. Sabra Heck, CMA and reviewed by me for thoroughness and accuracy.      Trinna Post, PA-C  [facility]

## 2019-08-08 ENCOUNTER — Encounter: Payer: Self-pay | Admitting: Physician Assistant

## 2019-08-08 ENCOUNTER — Other Ambulatory Visit: Payer: Self-pay

## 2019-08-08 ENCOUNTER — Ambulatory Visit (INDEPENDENT_AMBULATORY_CARE_PROVIDER_SITE_OTHER): Payer: PPO | Admitting: Physician Assistant

## 2019-08-08 ENCOUNTER — Ambulatory Visit
Admission: RE | Admit: 2019-08-08 | Discharge: 2019-08-08 | Disposition: A | Discharge status: Home / Self Care | Payer: PPO | Source: Ambulatory Visit | Attending: Physician Assistant | Admitting: Physician Assistant

## 2019-08-08 VITALS — BP 131/74 | HR 61 | Temp 96.9°F | Resp 16 | Ht 67.0 in | Wt 236.0 lb

## 2019-08-08 DIAGNOSIS — M25562 Pain in left knee: Secondary | ICD-10-CM | POA: Insufficient documentation

## 2019-08-08 DIAGNOSIS — I5032 Chronic diastolic (congestive) heart failure: Secondary | ICD-10-CM

## 2019-08-08 DIAGNOSIS — S8992XA Unspecified injury of left lower leg, initial encounter: Secondary | ICD-10-CM | POA: Diagnosis not present

## 2019-08-08 NOTE — Patient Instructions (Addendum)
Restart lasix 40 mg daily  Take this with the potassium 20 meQ daily with the lasix Follow up on Monday with Dr. Caryn Section

## 2019-08-11 ENCOUNTER — Ambulatory Visit (INDEPENDENT_AMBULATORY_CARE_PROVIDER_SITE_OTHER): Payer: PPO | Admitting: Family Medicine

## 2019-08-11 ENCOUNTER — Other Ambulatory Visit: Payer: Self-pay | Admitting: Family Medicine

## 2019-08-11 ENCOUNTER — Telehealth: Payer: Self-pay

## 2019-08-11 ENCOUNTER — Encounter: Payer: Self-pay | Admitting: Family Medicine

## 2019-08-11 ENCOUNTER — Other Ambulatory Visit: Payer: Self-pay

## 2019-08-11 VITALS — BP 142/80 | HR 86 | Temp 97.3°F | Resp 30 | Wt 238.0 lb

## 2019-08-11 DIAGNOSIS — S8002XD Contusion of left knee, subsequent encounter: Secondary | ICD-10-CM

## 2019-08-11 DIAGNOSIS — R0601 Orthopnea: Secondary | ICD-10-CM | POA: Diagnosis not present

## 2019-08-11 DIAGNOSIS — M25469 Effusion, unspecified knee: Secondary | ICD-10-CM

## 2019-08-11 DIAGNOSIS — R0600 Dyspnea, unspecified: Secondary | ICD-10-CM | POA: Diagnosis not present

## 2019-08-11 NOTE — Telephone Encounter (Signed)
LVMTRC 

## 2019-08-11 NOTE — Telephone Encounter (Signed)
-----   Message from Trinna Post, [place] sent at 08/11/2019  4:42 PM EDT ----- Knee xray is negative.

## 2019-08-11 NOTE — Patient Instructions (Signed)
.   Please review the attached list of medications and notify my office if there are any errors.   . Please bring all of your medications to every appointment so we can make sure that our medication list is the same as yours.   . It is especially important to get the annual flu vaccine this year. If you haven't had it already, please go to your pharmacy or call the office as soon as possible to schedule you flu shot.  

## 2019-08-11 NOTE — Progress Notes (Signed)
Patient: Joseph Macdonald Male    DOB: November 27, 1946   73 y.o.   MRN: [id number] Visit Date: 08/11/2019  Today's Provider: Lelon Huh, MD   Chief Complaint  Patient presents with  . Follow-up  . Knee Pain   Subjective:     HPI  Follow up for CHF and knee pain  The patient was last seen for this 2 days ago (Seen by Marton Redwood PA-C) with left knee pain after fall. He had xrays which were unremarkable.  Changes made at last visit include restarting Lasix 40mg  along with 11meq of Potassium.  He reports good compliance with treatment. He feels that condition is Unchanged. Although swelling has improved slightly.  He is not having side effects.   Wt Readings from Last 3 Encounters:  08/11/19 238 lb (108 kg)  08/08/19 236 lb (107 kg)  07/08/19 225 lb (102.1 kg)    ------------------------------------------------------------------------------------  No Known Allergies   Current Outpatient Medications:  .  albuterol (PROVENTIL HFA;VENTOLIN HFA) 108 (90 Base) MCG/ACT inhaler, Inhale into the lungs every 6 (six) hours as needed for wheezing or shortness of breath., Disp: , Rfl:  .  amLODipine (NORVASC) 5 MG tablet, Take 1 tablet (5 mg total) by mouth daily., Disp: 90 tablet, Rfl: 3 .  aspirin EC 81 MG tablet, Take 81 mg by mouth daily., Disp: , Rfl:  .  budesonide-formoterol (SYMBICORT) 160-4.5 MCG/ACT inhaler, Inhale 2 puffs into the lungs 2 (two) times daily., Disp: 1 Inhaler, Rfl: 0 .  calcium-vitamin D (OSCAL 500/200 D-3) 500-200 MG-UNIT tablet, Take 1 tablet by mouth 2 (two) times daily., Disp: 180 tablet, Rfl: 3 .  famotidine (PEPCID) 20 MG tablet, Take 20 mg by mouth as needed for heartburn or indigestion., Disp: , Rfl:  .  flecainide (TAMBOCOR) 50 MG tablet, Take 50 mg by mouth 2 (two) times daily., Disp: , Rfl:  .  furosemide (LASIX) 40 MG tablet, TAKE ONE TABLET BY MOUTH DAILY UNTIL SWELLING/BREATHING RETURN TO NORMAL - THEN TAKE AS NEEDED FOR SWELLING/WEIGHT  GAIN, Disp: 90 tablet, Rfl: 3 .  gabapentin (NEURONTIN) 300 MG capsule, Take 600 mg by mouth at bedtime., Disp: , Rfl:  .  HYDROcodone-acetaminophen (NORCO/VICODIN) 5-325 MG tablet, Take 1 tablet by mouth every 6 (six) hours as needed for moderate pain., Disp: 120 tablet, Rfl: 0 .  mycophenolate (CELLCEPT) 250 MG capsule, Take 1,500 mg by mouth daily., Disp: , Rfl:  .  omeprazole (PRILOSEC) 40 MG capsule, Take 40 mg by mouth daily., Disp: , Rfl:  .  potassium chloride SA (K-DUR) 20 MEQ tablet, Take 2 tablets (40 mEq total) by mouth 2 (two) times daily for 5 days., Disp: 20 tablet, Rfl: 0 .  pravastatin (PRAVACHOL) 20 MG tablet, Take 20 mg by mouth daily., Disp: , Rfl:  .  predniSONE (DELTASONE) 20 MG tablet, Take 40 mg by mouth daily with breakfast. , Disp: , Rfl:  .  pyridostigmine (MESTINON) 60 MG tablet, Take 60 mg by mouth 4 (four) times daily. , Disp: , Rfl:  .  tiotropium (SPIRIVA) 18 MCG inhalation capsule, Place 18 mcg into inhaler and inhale daily., Disp: , Rfl:  .  vitamin B-12 (CYANOCOBALAMIN) 1000 MCG tablet, Take 1,000 mcg by mouth daily., Disp: , Rfl:   Review of Systems  Constitutional: Negative for appetite change, chills and fever.  Respiratory: Positive for shortness of breath. Negative for chest tightness and wheezing.   Cardiovascular: Positive for leg swelling (left leg). Negative for  chest pain and palpitations.  Gastrointestinal: Negative for abdominal pain, nausea and vomiting.  Musculoskeletal: Positive for arthralgias (left knee pain).    Social History   Tobacco Use  . Smoking status: Former Smoker    Packs/day: 0.50    Years: 40.00    Pack years: 20.00    Types: Pipe, Cigarettes    Quit date: 08/19/2017    Years since quitting: 1.9  . Smokeless tobacco: Former Systems developer  . Tobacco comment: previously smoked 1 1/2 pack since his 73 years old  Substance Use Topics  . Alcohol use: Yes    Comment: occasionally      Objective:   BP (!) 142/80 (BP Location: Left  Arm, Patient Position: Sitting, Cuff Size: Large)   Pulse 86   Temp (!) 97.3 F (36.3 C) (Temporal)   Resp (!) 30   Wt 238 lb (108 kg)   SpO2 94% Comment: room air  BMI 37.28 kg/m  Vitals:   08/11/19 1529 08/11/19 1533  BP: (!) 150/72 (!) 142/80  Pulse: 86   Resp: (!) 30   Temp: (!) 97.3 F (36.3 C)   TempSrc: Temporal   SpO2: 94%   Weight: 238 lb (108 kg)   Body mass index is 37.28 kg/m.   Physical Exam    General Appearance:    Alert, cooperative, no distress  Eyes:    PERRL, conjunctiva/corneas clear, EOM's intact       Lungs:     Clear to auscultation bilaterally, respirations unlabored  Heart:    Normal heart rate. Normal rhythm. No murmurs, rubs, or gallops.   MS:     Left knee with moderate bruising and swelling 1+ bipedal edema.       Assessment & Plan    1. Orthopnea  2. Dyspnea, unspecified type This is chronic, but patient feels it has been worse lately despite starting back on furosemide. Check - Brain natriuretic peptide - CBC - Comprehensive metabolic panel  3. Contusion of left knee, subsequent encounter   4. Knee swelling May be entirely due to recent fall, but gout is on Differential diagnosis.  - Uric acid   The entirety of the information documented in the History of Present Illness, Review of Systems and Physical Exam were personally obtained by me. Portions of this information were initially documented by Meyer Cory, CMA and reviewed by me for thoroughness and accuracy.      Lelon Huh, MD  [facility]

## 2019-08-12 LAB — CBC
Hematocrit: 41.7 % (ref 37.5–51.0)
Hemoglobin: 13.8 g/dL (ref 13.0–17.7)
MCH: 31.7 pg (ref 26.6–33.0)
MCHC: 33.1 g/dL (ref 31.5–35.7)
MCV: 96 fL (ref 79–97)
Platelets: 218 10*3/uL (ref 150–450)
RBC: 4.36 x10E6/uL (ref 4.14–5.80)
RDW: 13.6 % (ref 11.6–15.4)
WBC: 13.9 10*3/uL — ABNORMAL HIGH (ref 3.4–10.8)

## 2019-08-12 LAB — COMPREHENSIVE METABOLIC PANEL
ALT: 25 IU/L (ref 0–44)
AST: 14 IU/L (ref 0–40)
Albumin/Globulin Ratio: 1.9 (ref 1.2–2.2)
Albumin: 3.7 g/dL (ref 3.7–4.7)
Alkaline Phosphatase: 61 IU/L (ref 39–117)
BUN/Creatinine Ratio: 33 — ABNORMAL HIGH (ref 10–24)
BUN: 29 mg/dL — ABNORMAL HIGH (ref 8–27)
Bilirubin Total: 0.7 mg/dL (ref 0.0–1.2)
CO2: 29 mmol/L (ref 20–29)
Calcium: 8.4 mg/dL — ABNORMAL LOW (ref 8.6–10.2)
Chloride: 98 mmol/L (ref 96–106)
Creatinine, Ser: 0.89 mg/dL (ref 0.76–1.27)
GFR calc Af Amer: 99 mL/min/{1.73_m2} (ref >59)
GFR calc non Af Amer: 85 mL/min/{1.73_m2} (ref >59)
Globulin, Total: 2 g/dL (ref 1.5–4.5)
Glucose: 184 mg/dL — ABNORMAL HIGH (ref 65–99)
Potassium: 4.3 mmol/L (ref 3.5–5.2)
Sodium: 142 mmol/L (ref 134–144)
Total Protein: 5.7 g/dL — ABNORMAL LOW (ref 6.0–8.5)

## 2019-08-12 LAB — BRAIN NATRIURETIC PEPTIDE: BNP: 62.7 pg/mL (ref 0.0–100.0)

## 2019-08-12 LAB — URIC ACID: Uric Acid: 3.1 mg/dL — ABNORMAL LOW (ref 3.7–8.6)

## 2019-08-12 NOTE — Telephone Encounter (Signed)
Patient advised as below.  

## 2019-08-20 ENCOUNTER — Encounter: Payer: Self-pay | Admitting: Family Medicine

## 2019-08-22 ENCOUNTER — Other Ambulatory Visit: Payer: Self-pay

## 2019-08-22 DIAGNOSIS — M9979 Connective tissue and disc stenosis of intervertebral foramina of abdomen and other regions: Secondary | ICD-10-CM

## 2019-08-22 MED ORDER — HYDROCODONE-ACETAMINOPHEN 5-325 MG PO TABS: 1.0000 | ORAL_TABLET | Freq: Four times a day (QID) | ORAL | 0 refills | Status: DC | PRN

## 2019-08-22 NOTE — Telephone Encounter (Signed)
Patient is requesting a medication refill on Hydrocodone.

## 2019-08-22 NOTE — Telephone Encounter (Signed)
Please review. Thanks!  

## 2019-08-27 ENCOUNTER — Encounter: Payer: Self-pay | Admitting: Family Medicine

## 2019-08-27 DIAGNOSIS — M9979 Connective tissue and disc stenosis of intervertebral foramina of abdomen and other regions: Secondary | ICD-10-CM

## 2019-08-28 DIAGNOSIS — R531 Weakness: Secondary | ICD-10-CM | POA: Diagnosis not present

## 2019-08-28 DIAGNOSIS — Z743 Need for continuous supervision: Secondary | ICD-10-CM | POA: Diagnosis not present

## 2019-09-01 DIAGNOSIS — J449 Chronic obstructive pulmonary disease, unspecified: Secondary | ICD-10-CM | POA: Diagnosis not present

## 2019-09-29 ENCOUNTER — Other Ambulatory Visit: Payer: Self-pay

## 2019-09-29 DIAGNOSIS — M9979 Connective tissue and disc stenosis of intervertebral foramina of abdomen and other regions: Secondary | ICD-10-CM

## 2019-09-29 MED ORDER — HYDROCODONE-ACETAMINOPHEN 5-325 MG PO TABS: 1.0000 | ORAL_TABLET | Freq: Four times a day (QID) | ORAL | 0 refills | Status: DC | PRN

## 2019-09-29 NOTE — Telephone Encounter (Signed)
Patient's wife [name] sent refill request via [place]. Please review.

## 2019-10-23 ENCOUNTER — Telehealth: Payer: Self-pay | Admitting: Family Medicine

## 2019-10-23 DIAGNOSIS — R1084 Generalized abdominal pain: Secondary | ICD-10-CM

## 2019-10-23 DIAGNOSIS — R109 Unspecified abdominal pain: Secondary | ICD-10-CM | POA: Diagnosis not present

## 2019-10-23 DIAGNOSIS — R197 Diarrhea, unspecified: Secondary | ICD-10-CM

## 2019-10-23 NOTE — Telephone Encounter (Signed)
Destiny, NP w/ [facility] [phone]  Pt is not doing well with his GI issues. [facility] but they will not be able to see him until Dec 24th.  Asking if Dr. Caryn Section could put in a referral for pt to be seen with a local GI Dr to possibly be seen sooner.  Please advise asap.  Thanks, [employer]

## 2019-10-23 NOTE — Telephone Encounter (Signed)
I called Destiny to get more information. Destiny states that patient has been having ongoing nausea, loose stools, abdominal pain and abdominal distention. Destiny states she did an in-home abdominal x ray on the patient, which didn't show any obstruction or abnormality. Patient has a GI appointment at the [place] on 12/04/2019.  Destiny wants to know if Dr. Caryn Section could order a GI referral to see if we can get the patient in somewhere else sooner. Please advise.

## 2019-10-24 NOTE — Telephone Encounter (Signed)
He needs to go ahead and good stool studies done. Can send in referral too.

## 2019-10-24 NOTE — Telephone Encounter (Signed)
I called Joseph Macdonald and advised her as below. Patient plans to come to the lab some time after next week for stool studies. Lab slip placed up front at [address].

## 2019-10-29 NOTE — Progress Notes (Signed)
Subjective:   Joseph Macdonald is a 73 y.o. male who presents for Medicare Annual/Subsequent preventive examination.    This visit is being conducted through telemedicine due to the COVID-19 pandemic. This patient has given me verbal consent via doximity to conduct this visit, patient states they are participating from their home address. Some vital signs may be absent or patient reported.    Patient identification: identified by name, DOB, and current address  Review of Systems:  N/A Diabetes:  Is the patient diabetic?  Yes  If diabetic, was a CBG obtained today?  No  Did the patient bring in their glucometer from home?  No  How often do you monitor your CBG's? Twice a day.   Financial Strains and Diabetes Management:  Are you having any financial strains with the device, your supplies or your medication? No .  Does the patient want to be seen by Chronic Care Management for management of their diabetes?  No  Would the patient like to be referred to a Nutritionist or for Diabetic Management?  No   Diabetic Exams:  Diabetic Eye Exam: Completed 09/10/19. Repeat yearly.  Diabetic Foot Exam: Completed 09/10/19. Repeat yearly.   Cardiac Risk Factors include: advanced age (>55men, >21 women);diabetes mellitus;dyslipidemia;hypertension;male gender     Objective:    Vitals: There were no vitals taken for this visit.  There is no height or weight on file to calculate BMI. Unable to obtain vitals due to visit being conducted via telephonically.   Advanced Directives 10/30/2019 06/10/2019 10/28/2018 10/25/2017 05/15/2016 01/17/2016  Does Patient Have a Medical Advance Directive? Yes No No No No No  Type of Paramedic of [place];Living will - - - - -  Copy of [place] in Chart? No - copy requested - - - - -  Would patient like information on creating a medical advance directive? No - Patient declined No - Patient declined - No - Patient  declined - -    Tobacco Social History   Tobacco Use  Smoking Status Former Smoker  . Packs/day: 0.50  . Years: 40.00  . Pack years: 20.00  . Types: Pipe, Cigarettes  . Quit date: 08/19/2017  . Years since quitting: 2.1  Smokeless Tobacco Former Systems developer  Tobacco Comment   previously smoked 1 1/2 pack since his 73 years old     Counseling given: Not Answered Comment: previously smoked 1 1/2 pack since his 73 years old   Clinical Intake:  Pre-visit preparation completed: Yes  Pain : 0-10 Pain Score: 6  Pain Type: Chronic pain Pain Location: Abdomen(left side of body) Pain Descriptors / Indicators: Aching Pain Frequency: Constant     Nutritional Risks: None Diabetes: Yes  How often do you need to have someone help you when you read instructions, pamphlets, or other written materials from your doctor or pharmacy?: 1 - Never  Interpreter Needed?: No  Information entered by :: [facility], LPN  Past Medical History:  Diagnosis Date  . Hyperlipidemia   . Hypertension   . Myasthenia gravis ([place])   . Oxygen deficit   . Ventricular tachycardia [facility])    Past Surgical History:  Procedure Laterality Date  . carpel tunnel sx    . CATARACT EXTRACTION     right eye  . SKIN GRAFT     Behind left knee  . TONSILLECTOMY AND ADENOIDECTOMY     Family History  Problem Relation Age of Onset  . Hypertension Mother   .  Thyroid disease Mother   . Kidney disease Mother   . Arthritis Mother   . Stroke Father   . Heart disease Father 39  . Arthritis Father   . Heart attack Father   . Arthritis Other    Social History   Socioeconomic History  . Marital status: Married    Spouse name: Not on file  . Number of children: 1  . Years of education: H/S  . Highest education level: High school graduate  Occupational History  . Occupation: Retired [employer] and [employer]  . Financial resource strain: Not hard at all  . Food insecurity    Worry: Never true     Inability: Never true  . Transportation needs    Medical: No    Non-medical: No  Tobacco Use  . Smoking status: Former Smoker    Packs/day: 0.50    Years: 40.00    Pack years: 20.00    Types: Pipe, Cigarettes    Quit date: 08/19/2017    Years since quitting: 2.1  . Smokeless tobacco: Former Systems developer  . Tobacco comment: previously smoked 1 1/2 pack since his 73 years old  Substance and Sexual Activity  . Alcohol use: Yes    Comment: occasionally  . Drug use: No  . Sexual activity: Not on file  Lifestyle  . Physical activity    Days per week: 0 days    Minutes per session: 0 min  . Stress: Only a little  Relationships  . Social Herbalist on phone: Patient refused    Gets together: Patient refused    Attends religious service: Patient refused    Active member of club or organization: Patient refused    Attends meetings of clubs or organizations: Patient refused    Relationship status: Patient refused  Other Topics Concern  . Not on file  Social History Narrative   2 adopted children    Outpatient Encounter Medications as of 10/30/2019  Medication Sig  . albuterol (PROVENTIL HFA;VENTOLIN HFA) 108 (90 Base) MCG/ACT inhaler Inhale into the lungs every 6 (six) hours as needed for wheezing or shortness of breath.  Marland Kitchen apixaban (ELIQUIS) 5 MG TABS tablet Take 5 mg by mouth 2 (two) times daily.  Marland Kitchen aspirin EC 81 MG tablet Take 81 mg by mouth daily.  . budesonide-formoterol (SYMBICORT) 160-4.5 MCG/ACT inhaler Inhale 2 puffs into the lungs 2 (two) times daily.  . calcium-vitamin D (OSCAL 500/200 D-3) 500-200 MG-UNIT tablet Take 1 tablet by mouth 2 (two) times daily.  . famotidine (PEPCID) 20 MG tablet Take 20 mg by mouth as needed for heartburn or indigestion.  . flecainide (TAMBOCOR) 50 MG tablet Take 50 mg by mouth 2 (two) times daily.  . furosemide (LASIX) 40 MG tablet TAKE ONE TABLET BY MOUTH DAILY UNTIL SWELLING/BREATHING RETURN TO NORMAL - THEN TAKE AS NEEDED FOR  SWELLING/WEIGHT GAIN  . gabapentin (NEURONTIN) 300 MG capsule Take 600 mg by mouth at bedtime.  Marland Kitchen HYDROcodone-acetaminophen (NORCO/VICODIN) 5-325 MG tablet Take 1 tablet by mouth every 6 (six) hours as needed for moderate pain.  . metFORMIN (GLUCOPHAGE) 500 MG tablet Take by mouth 2 (two) times daily with a meal.  . metoprolol tartrate (LOPRESSOR) 25 MG tablet Take 12.5 mg by mouth 2 (two) times daily.   . mycophenolate (CELLCEPT) 250 MG capsule Take 1,000 mg by mouth daily.   Marland Kitchen omeprazole (PRILOSEC) 40 MG capsule Take 40 mg by mouth 2 (two) times daily.   Marland Kitchen  pravastatin (PRAVACHOL) 20 MG tablet Take 20 mg by mouth daily.  . predniSONE (DELTASONE) 20 MG tablet Take 30-40 mg by mouth daily with breakfast. 30 mg on M,W,F and 40 mg on all other days  . pyridostigmine (MESTINON) 60 MG tablet Take 60 mg by mouth 3 (three) times daily.   Marland Kitchen sulfamethoxazole-trimethoprim (BACTRIM) 400-80 MG tablet Take 1 tablet by mouth daily.  . vitamin B-12 (CYANOCOBALAMIN) 1000 MCG tablet Take 1,000 mcg by mouth daily.  Marland Kitchen amLODipine (NORVASC) 5 MG tablet Take 1 tablet (5 mg total) by mouth daily.  . potassium chloride SA (K-DUR) 20 MEQ tablet Take 2 tablets (40 mEq total) by mouth 2 (two) times daily for 5 days.  Marland Kitchen tiotropium (SPIRIVA) 18 MCG inhalation capsule Place 18 mcg into inhaler and inhale daily.   No facility-administered encounter medications on file as of 10/30/2019.     Activities of Daily Living In your present state of health, do you have any difficulty performing the following activities: 10/30/2019 06/11/2019  Hearing? N N  Vision? N N  Difficulty concentrating or making decisions? N N  Walking or climbing stairs? Y Y  Comment Due to SOB from COPD. -  Dressing or bathing? N N  Doing errands, shopping? N N  Preparing Food and eating ? N -  Using the Toilet? N -  In the past six months, have you accidently leaked urine? N -  Do you have problems with loss of bowel control? N -  Managing your  Medications? N -  Managing your Finances? N -  Housekeeping or managing your Housekeeping? Y -  Comment Unable to do housework due to limitations from SOB and pain. -  Some recent data might be hidden    Patient Care Team: [name], MD as PCP - General (Family Medicine) End, Harrell Gave, MD as PCP - Cardiology (Cardiology) Desiree Hane, [place] as Consulting Physician (Physician Assistant) Minna Merritts, MD as Consulting Physician (Cardiology) Dorcas Carrow, MD as Referring Physician (Hematology and Oncology) Angelique Blonder, MD as Referring Physician (Neurology)   Assessment:   This is a routine wellness examination for Joseph Macdonald.  Exercise Activities and Dietary recommendations Current Exercise Habits: The patient does not participate in regular exercise at present, Exercise limited by: orthopedic condition(s);respiratory conditions(s)  Goals    . Prevent falls     Recommend to remove any items from the home that may cause slips or trips.    . Reduce portion size     Recommend cutting portion sizes in half and eating 3 small meals a day with 2 healthy snacks.        Fall Risk: Fall Risk  10/30/2019 10/28/2018 10/25/2017 08/28/2017 05/15/2016  Falls in the past year? 1 0 No No No  Number falls in past yr: 1 - - - -  Injury with Fall? 0 - - - -  Risk for fall due to : Other (Comment) - - - -  Risk for fall due to: Comment respiratory issues - - - -  Follow up Falls prevention discussed - - - -    FALL RISK PREVENTION PERTAINING TO THE HOME:  Any stairs in or around the home? No  If so, are there any without handrails? N/A  Home free of loose throw rugs in walkways, pet beds, electrical cords, etc? Yes  Adequate lighting in your home to reduce risk of falls? Yes   ASSISTIVE DEVICES UTILIZED TO PREVENT FALLS:  Life alert? No  Use  of a cane, walker or w/c? Yes  Grab bars in the bathroom? Yes  Shower chair or bench in shower? Yes  Elevated toilet seat or a  handicapped toilet? No   TIMED UP AND GO:  Was the test performed? No .    Depression Screen PHQ 2/9 Scores 10/28/2018 10/25/2017 08/28/2017 05/15/2016  PHQ - 2 Score 0 0 0 0  PHQ- 9 Score - 2 0 -    Cognitive Function: Declined today.         Immunization History  Administered Date(s) Administered  . Influenza, High Dose Seasonal PF 08/21/2015, 09/12/2016, 08/28/2017, 09/11/2018  . Pneumococcal Conjugate-13 03/11/2014    Qualifies for Shingles Vaccine? Yes . Due for Shingrix. Pt has been advised to call insurance company to determine out of pocket expense. Advised may also receive vaccine at local pharmacy or Health Dept. Verbalized acceptance and understanding.  Tdap: Although this vaccine is not a covered service during a Wellness Exam, does the patient still wish to receive this vaccine today?  No .   Flu Vaccine: Up to date  Pneumococcal Vaccine: Unsure if he has had this series of vaccines. Pt to check with the [facility] and report back to clinic.   Screening Tests Health Maintenance  Topic Date Due  . Hepatitis C Screening  Jul 12, 1946  . TETANUS/TDAP  02/20/1966  . PNA vac Low Risk Adult (2 of 2 - PPSV23) 03/12/2015  . COLONOSCOPY  05/08/2017  . INFLUENZA VACCINE  Completed   Cancer Screenings:  Colorectal Screening: Completed 05/08/12. Repeat every 5 years. [facility] states [facility] him. Requested records to up date file.  Lung Cancer Screening: (Low Dose CT Chest recommended if Age 63-80 years, 30 pack-year currently smoking OR have quit w/in 15years.) does qualify however has already had this completed this year.   Additional Screening:  Hepatitis C Screening: does qualify; however states he has had this completed in the past.   Vision Screening: Recommended annual ophthalmology exams for early detection of glaucoma and other disorders of the eye.  Dental Screening: Recommended annual dental exams for proper oral hygiene  Community Resource  Referral:  CRR required this visit?  No        Plan:  I have personally reviewed and addressed the Medicare Annual Wellness questionnaire and have noted the following in the patient's chart:  A. Medical and social history B. Use of alcohol, tobacco or illicit drugs  C. Current medications and supplements D. Functional ability and status E.  Nutritional status F.  Physical activity G. Advance directives H. List of other physicians I.  Hospitalizations, surgeries, and ER visits in previous 12 months J.  [place] such as hearing and vision if needed, cognitive and depression L. Referrals and appointments   In addition, I have reviewed and discussed with patient certain preventive protocols, quality metrics, and best practice recommendations. A written personalized care plan for preventive services as well as general preventive health recommendations were provided to patient.   Glendora Score, [place]  [id number] Nurse Health Advisor   Nurse Notes: Pt to check with [facility] about previous vaccine and screening records. Need to update file once received.

## 2019-10-30 ENCOUNTER — Ambulatory Visit (INDEPENDENT_AMBULATORY_CARE_PROVIDER_SITE_OTHER): Payer: PPO

## 2019-10-30 ENCOUNTER — Other Ambulatory Visit: Payer: Self-pay

## 2019-10-30 DIAGNOSIS — Z0000 Encounter for general adult medical examination without abnormal findings: Secondary | ICD-10-CM | POA: Diagnosis not present

## 2019-10-30 NOTE — Patient Instructions (Signed)
Joseph Macdonald , Thank you for taking time to come for your Medicare Wellness Visit. I appreciate your ongoing commitment to your health goals. Please review the following plan we discussed and let me know if I can assist you in the future.   Screening recommendations/referrals: Colonoscopy: Currently up to date per pt. Requested records from [place]. Recommended yearly ophthalmology/optometry visit for glaucoma screening and checkup Recommended yearly dental visit for hygiene and checkup  Vaccinations: Influenza vaccine: Up to date Pneumococcal vaccine: Pt to check with [facility] and see if up to date on series.  Tdap vaccine: Pt declines today.  Shingles vaccine: Pt declines today.     Advanced directives: Please bring a copy of your POA (Power of Attorney) and/or Living Will to your next appointment.   Conditions/risks identified: Fall risk prevention discussed today.   Next appointment: Declined scheduling a follow up with PCP or an AWV for 2021 at this time.   Preventive Care 19 Years and Older, Male Preventive care refers to lifestyle choices and visits with your health care provider that can promote health and wellness. What does preventive care include?  A yearly physical exam. This is also called an annual well check.  Dental exams once or twice a year.  Routine eye exams. Ask your health care provider how often you should have your eyes checked.  Personal lifestyle choices, including:  Daily care of your teeth and gums.  Regular physical activity.  Eating a healthy diet.  Avoiding tobacco and drug use.  Limiting alcohol use.  Practicing safe sex.  Taking low doses of aspirin every day.  Taking vitamin and mineral supplements as recommended by your health care provider. What happens during an annual well check? The services and screenings done by your health care provider during your annual well check will depend on your age, overall health, lifestyle risk factors, and  family history of disease. Counseling  Your health care provider may ask you questions about your:  Alcohol use.  Tobacco use.  Drug use.  Emotional well-being.  Home and relationship well-being.  Sexual activity.  Eating habits.  History of falls.  Memory and ability to understand (cognition).  Work and work Statistician. Screening  You may have the following tests or measurements:  Height, weight, and BMI.  Blood pressure.  Lipid and cholesterol levels. These may be checked every 5 years, or more frequently if you are over 41 years old.  Skin check.  Lung cancer screening. You may have this screening every year starting at age 69 if you have a 30-pack-year history of smoking and currently smoke or have quit within the past 15 years.  Fecal occult blood test (FOBT) of the stool. You may have this test every year starting at age 52.  Flexible sigmoidoscopy or colonoscopy. You may have a sigmoidoscopy every 5 years or a colonoscopy every 10 years starting at age 31.  Prostate cancer screening. Recommendations will vary depending on your family history and other risks.  Hepatitis C blood test.  Hepatitis B blood test.  Sexually transmitted disease (STD) testing.  Diabetes screening. This is done by checking your blood sugar (glucose) after you have not eaten for a while (fasting). You may have this done every 1-3 years.  Abdominal aortic aneurysm (AAA) screening. You may need this if you are a current or former smoker.  Osteoporosis. You may be screened starting at age 34 if you are at high risk. Talk with your health care provider about your test  results, treatment options, and if necessary, the need for more tests. Vaccines  Your health care provider may recommend certain vaccines, such as:  Influenza vaccine. This is recommended every year.  Tetanus, diphtheria, and acellular pertussis (Tdap, Td) vaccine. You may need a Td booster every 10 years.  Zoster  vaccine. You may need this after age 28.  Pneumococcal 13-valent conjugate (PCV13) vaccine. One dose is recommended after age 89.  Pneumococcal polysaccharide (PPSV23) vaccine. One dose is recommended after age 90. Talk to your health care provider about which screenings and vaccines you need and how often you need them. This information is not intended to replace advice given to you by your health care provider. Make sure you discuss any questions you have with your health care provider. Document Released: 12/24/2015 Document Revised: 08/16/2016 Document Reviewed: 09/28/2015 Elsevier Interactive Patient Education  2017 [place] Prevention in the Home Falls can cause injuries. They can happen to people of all ages. There are many things you can do to make your home safe and to help prevent falls. What can I do on the outside of my home?  Regularly fix the edges of walkways and driveways and fix any cracks.  Remove anything that might make you trip as you walk through a door, such as a raised step or threshold.  Trim any bushes or trees on the path to your home.  Use bright outdoor lighting.  Clear any walking paths of anything that might make someone trip, such as rocks or tools.  Regularly check to see if handrails are loose or broken. Make sure that both sides of any steps have handrails.  Any raised decks and porches should have guardrails on the edges.  Have any leaves, snow, or ice cleared regularly.  Use sand or salt on walking paths during winter.  Clean up any spills in your garage right away. This includes oil or grease spills. What can I do in the bathroom?  Use night lights.  Install grab bars by the toilet and in the tub and shower. Do not use towel bars as grab bars.  Use non-skid mats or decals in the tub or shower.  If you need to sit down in the shower, use a plastic, non-slip stool.  Keep the floor dry. Clean up any water that spills on the  floor as soon as it happens.  Remove soap buildup in the tub or shower regularly.  Attach bath mats securely with double-sided non-slip rug tape.  Do not have throw rugs and other things on the floor that can make you trip. What can I do in the bedroom?  Use night lights.  Make sure that you have a light by your bed that is easy to reach.  Do not use any sheets or blankets that are too big for your bed. They should not hang down onto the floor.  Have a firm chair that has side arms. You can use this for support while you get dressed.  Do not have throw rugs and other things on the floor that can make you trip. What can I do in the kitchen?  Clean up any spills right away.  Avoid walking on wet floors.  Keep items that you use a lot in easy-to-reach places.  If you need to reach something above you, use a strong step stool that has a grab bar.  Keep electrical cords out of the way.  Do not use floor polish or wax that makes  floors slippery. If you must use wax, use non-skid floor wax.  Do not have throw rugs and other things on the floor that can make you trip. What can I do with my stairs?  Do not leave any items on the stairs.  Make sure that there are handrails on both sides of the stairs and use them. Fix handrails that are broken or loose. Make sure that handrails are as long as the stairways.  Check any carpeting to make sure that it is firmly attached to the stairs. Fix any carpet that is loose or worn.  Avoid having throw rugs at the top or bottom of the stairs. If you do have throw rugs, attach them to the floor with carpet tape.  Make sure that you have a light switch at the top of the stairs and the bottom of the stairs. If you do not have them, ask someone to add them for you. What else can I do to help prevent falls?  Wear shoes that:  Do not have high heels.  Have rubber bottoms.  Are comfortable and fit you well.  Are closed at the toe. Do not wear  sandals.  If you use a stepladder:  Make sure that it is fully opened. Do not climb a closed stepladder.  Make sure that both sides of the stepladder are locked into place.  Ask someone to hold it for you, if possible.  Clearly mark and make sure that you can see:  Any grab bars or handrails.  First and last steps.  Where the edge of each step is.  Use tools that help you move around (mobility aids) if they are needed. These include:  Canes.  Walkers.  Scooters.  Crutches.  Turn on the lights when you go into a dark area. Replace any light bulbs as soon as they burn out.  Set up your furniture so you have a clear path. Avoid moving your furniture around.  If any of your floors are uneven, fix them.  If there are any pets around you, be aware of where they are.  Review your medicines with your doctor. Some medicines can make you feel dizzy. This can increase your chance of falling. Ask your doctor what other things that you can do to help prevent falls. This information is not intended to replace advice given to you by your health care provider. Make sure you discuss any questions you have with your health care provider. Document Released: 09/23/2009 Document Revised: 05/04/2016 Document Reviewed: 01/01/2015 Elsevier Interactive Patient Education  2017 [employer].

## 2019-11-05 ENCOUNTER — Other Ambulatory Visit: Payer: Self-pay

## 2019-11-05 DIAGNOSIS — M9979 Connective tissue and disc stenosis of intervertebral foramina of abdomen and other regions: Secondary | ICD-10-CM

## 2019-11-05 MED ORDER — HYDROCODONE-ACETAMINOPHEN 5-325 MG PO TABS: 1.0000 | ORAL_TABLET | Freq: Four times a day (QID) | ORAL | 0 refills | Status: DC | PRN

## 2019-11-05 NOTE — Telephone Encounter (Signed)
Patient's wife request refill.

## 2019-11-29 ENCOUNTER — Emergency Department
Admission: EM | Admit: 2019-11-29 | Discharge: 2019-11-29 | Disposition: A | Discharge status: Home / Self Care | Payer: No Typology Code available for payment source | Attending: Emergency Medicine | Admitting: Emergency Medicine

## 2019-11-29 ENCOUNTER — Other Ambulatory Visit: Payer: Self-pay

## 2019-11-29 ENCOUNTER — Emergency Department: Payer: No Typology Code available for payment source

## 2019-11-29 ENCOUNTER — Encounter: Payer: Self-pay | Admitting: Emergency Medicine

## 2019-11-29 DIAGNOSIS — Y998 Other external cause status: Secondary | ICD-10-CM | POA: Insufficient documentation

## 2019-11-29 DIAGNOSIS — Z7901 Long term (current) use of anticoagulants: Secondary | ICD-10-CM | POA: Diagnosis not present

## 2019-11-29 DIAGNOSIS — Z87891 Personal history of nicotine dependence: Secondary | ICD-10-CM | POA: Diagnosis not present

## 2019-11-29 DIAGNOSIS — W268XXA Contact with other sharp object(s), not elsewhere classified, initial encounter: Secondary | ICD-10-CM | POA: Insufficient documentation

## 2019-11-29 DIAGNOSIS — Y9289 Other specified places as the place of occurrence of the external cause: Secondary | ICD-10-CM | POA: Insufficient documentation

## 2019-11-29 DIAGNOSIS — J449 Chronic obstructive pulmonary disease, unspecified: Secondary | ICD-10-CM | POA: Diagnosis not present

## 2019-11-29 DIAGNOSIS — S0990XA Unspecified injury of head, initial encounter: Secondary | ICD-10-CM | POA: Diagnosis not present

## 2019-11-29 DIAGNOSIS — I10 Essential (primary) hypertension: Secondary | ICD-10-CM | POA: Diagnosis not present

## 2019-11-29 DIAGNOSIS — S199XXA Unspecified injury of neck, initial encounter: Secondary | ICD-10-CM | POA: Diagnosis not present

## 2019-11-29 DIAGNOSIS — Y9301 Activity, walking, marching and hiking: Secondary | ICD-10-CM | POA: Insufficient documentation

## 2019-11-29 DIAGNOSIS — Z7982 Long term (current) use of aspirin: Secondary | ICD-10-CM | POA: Insufficient documentation

## 2019-11-29 DIAGNOSIS — Z79899 Other long term (current) drug therapy: Secondary | ICD-10-CM | POA: Diagnosis not present

## 2019-11-29 DIAGNOSIS — R0902 Hypoxemia: Secondary | ICD-10-CM | POA: Diagnosis not present

## 2019-11-29 DIAGNOSIS — S8992XA Unspecified injury of left lower leg, initial encounter: Secondary | ICD-10-CM | POA: Diagnosis present

## 2019-11-29 DIAGNOSIS — S81812A Laceration without foreign body, left lower leg, initial encounter: Secondary | ICD-10-CM | POA: Diagnosis not present

## 2019-11-29 DIAGNOSIS — Z7984 Long term (current) use of oral hypoglycemic drugs: Secondary | ICD-10-CM | POA: Insufficient documentation

## 2019-11-29 MED ORDER — CEPHALEXIN 500 MG PO CAPS: 500.0000 mg | ORAL_CAPSULE | Freq: Three times a day (TID) | ORAL | 0 refills | Status: AC

## 2019-11-29 MED ORDER — LIDOCAINE HCL 1 % IJ SOLN
5.0000 mL | Freq: Once | INTRAMUSCULAR | Status: DC
  Filled 2019-11-29: qty 10

## 2019-11-29 NOTE — ED Provider Notes (Signed)
Emergency Department Provider Note  ____________________________________________  Time seen: Approximately 6:32 PM  I have reviewed the triage vital signs and the nursing notes.   HISTORY  Chief Complaint Extremity Laceration   Historian Patient    HPI Joseph Macdonald is a 73 y.o. male presents to the emergency department with a 4-1/2 cm, semilunar left lower extremity laceration.  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  His tetanus status is up-to-date.  No new paresthesias in the lower extremities.  Patient states that when he came back into the house, he tried to sit in his chair and missed the chair, falling on his buttocks.  He denies hitting his head or his neck.  He is currently taking a blood thinner, Eliquis.   Past Medical History:  Diagnosis Date  . Hyperlipidemia   . Hypertension   . Myasthenia gravis ([place])   . Oxygen deficit   . Ventricular tachycardia ([place])      Immunizations up to date:  Yes.     Past Medical History:  Diagnosis Date  . Hyperlipidemia   . Hypertension   . Myasthenia gravis ([place])   . Oxygen deficit   . Ventricular tachycardia [facility])     Patient Active Problem List   Diagnosis Date Noted  . Vitamin D deficiency 06/17/2019  . Acute on chronic kidney failure ([place]) 06/11/2019  . Shortness of breath 04/11/2019  . Epigastric pain 04/11/2019  . Hypokalemia 04/11/2019  . Ventricular tachycardia ([place]) 03/12/2019  . Chronic heart failure with preserved ejection fraction (HFpEF) ([place]) 03/12/2019  . Chest pain 03/12/2019  . COPD (chronic obstructive pulmonary disease) ([place]) 01/13/2019  . Pre-diabetes 03/19/2017  . Chronic narcotic use 01/17/2016  . Myasthenia gravis ([place]) 09/17/2015  . Anxiety 08/17/2015  . 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 Clinical depression 08/17/2015  . Acid reflux 08/17/2015  .  Hemorrhoid 08/17/2015  . Essential hypertension 08/17/2015  . Bad memory 08/17/2015  . 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 Allergic rhinitis, seasonal 08/17/2015  . Smoking greater than 30 pack years 08/17/2015  . Restless leg syndrome 08/17/2015    Past Surgical History:  Procedure Laterality Date  . carpel tunnel sx    . CATARACT EXTRACTION     right eye  . SKIN GRAFT     Behind left knee  . TONSILLECTOMY AND ADENOIDECTOMY      Prior to Admission medications   Medication Sig Start Date End Date Taking? Authorizing Provider  albuterol (PROVENTIL HFA;VENTOLIN HFA) 108 (90 Base) MCG/ACT inhaler Inhale into the lungs every 6 (six) hours as needed for wheezing or shortness of breath.    [provider]  amLODipine (NORVASC) 5 MG tablet Take 1 tablet (5 mg total) by mouth daily. 03/12/19 08/08/19  End, Harrell Gave, MD  apixaban (ELIQUIS) 5 MG TABS tablet Take 5 mg by mouth 2 (two) times daily.    [provider]  aspirin EC 81 MG tablet Take 81 mg by mouth daily.    [provider]  budesonide-formoterol (SYMBICORT) 160-4.5 MCG/ACT inhaler Inhale 2 puffs into the lungs 2 (two) times daily. 06/16/19   [name], MD  calcium-vitamin D (OSCAL 500/200 D-3) 500-200 MG-UNIT tablet Take 1 tablet by mouth 2 (two) times daily. 06/13/19 06/12/20  Dustin Flock, MD  cephALEXin (KEFLEX) 500 MG capsule Take 1 capsule (500 mg total) by mouth 3 (three) times daily for  7 days. 11/29/19 12/06/19  Lannie Fields, PA-C  famotidine (PEPCID) 20 MG tablet Take 20 mg by mouth as needed for heartburn or indigestion.    [provider]  flecainide (TAMBOCOR) 50 MG tablet Take 50 mg by mouth 2 (two) times daily.    [provider]  furosemide (LASIX) 40 MG tablet TAKE ONE TABLET BY MOUTH DAILY UNTIL SWELLING/BREATHING RETURN TO NORMAL - THEN TAKE AS NEEDED FOR SWELLING/WEIGHT GAIN 08/04/19   End, Harrell Gave, MD  gabapentin (NEURONTIN) 300 MG capsule Take 600 mg by  mouth at bedtime.    [provider]  HYDROcodone-acetaminophen (NORCO/VICODIN) 5-325 MG tablet Take 1 tablet by mouth every 6 (six) hours as needed for moderate pain. 11/05/19   [name], MD  metFORMIN (GLUCOPHAGE) 500 MG tablet Take by mouth 2 (two) times daily with a meal.    [provider]  metoprolol tartrate (LOPRESSOR) 25 MG tablet Take 12.5 mg by mouth 2 (two) times daily.  09/01/19   [provider]  mycophenolate (CELLCEPT) 250 MG capsule Take 1,000 mg by mouth daily.     [provider]  omeprazole (PRILOSEC) 40 MG capsule Take 40 mg by mouth 2 (two) times daily.     [provider]  potassium chloride SA (K-DUR) 20 MEQ tablet Take 2 tablets (40 mEq total) by mouth 2 (two) times daily for 5 days. 06/13/19 08/08/19  Dustin Flock, MD  pravastatin (PRAVACHOL) 20 MG tablet Take 20 mg by mouth daily.    [provider]  predniSONE (DELTASONE) 20 MG tablet Take 30-40 mg by mouth daily with breakfast. 30 mg on M,W,F and 40 mg on all other days    [provider]  pyridostigmine (MESTINON) 60 MG tablet Take 60 mg by mouth 3 (three) times daily.     [provider]  sulfamethoxazole-trimethoprim (BACTRIM) 400-80 MG tablet Take 1 tablet by mouth daily.    [provider]  tiotropium (SPIRIVA) 18 MCG inhalation capsule Place 18 mcg into inhaler and inhale daily.    [provider]  vitamin B-12 (CYANOCOBALAMIN) 1000 MCG tablet Take 1,000 mcg by mouth daily.    [provider]    Allergies Patient has no known allergies.  Family History  Problem Relation Age of Onset  . Hypertension Mother   . Thyroid disease Mother   . Kidney disease Mother   . Arthritis Mother   . Stroke Father   . Heart disease Father 61  . Arthritis Father   . Heart attack Father   . Arthritis Other     Social History Social History   Tobacco Use  . Smoking status: Former Smoker    Packs/day: 0.50     Years: 40.00    Pack years: 20.00    Types: Pipe, Cigarettes    Quit date: 08/19/2017    Years since quitting: 2.2  . Smokeless tobacco: Former Systems developer  . Tobacco comment: previously smoked 1 1/2 pack since his 73 years old  Substance Use Topics  . Alcohol use: Yes    Comment: occasionally  . Drug use: No     Review of Systems  Constitutional: No fever/chills Eyes:  No discharge ENT: No upper respiratory complaints. Respiratory: no cough. No SOB/ use of accessory muscles to breath Gastrointestinal:   No nausea, no vomiting.  No diarrhea.  No constipation. Musculoskeletal: Negative for musculoskeletal pain. Skin: Patient has laceration.     ____________________________________________   PHYSICAL EXAM:  VITAL SIGNS: ED  Triage Vitals  Enc Vitals Group     BP 11/29/19 1659 (!) 129/59     Pulse Rate 11/29/19 1659 (!) 50     Resp 11/29/19 1659 18     Temp --      Temp src --      SpO2 11/29/19 1659 96 %     Weight 11/29/19 1654 228 lb (103.4 kg)     Height 11/29/19 1654 5\' 7"  (1.702 m)     Head Circumference --      Peak Flow --      Pain Score 11/29/19 1659 0     Pain Loc --      Pain Edu? --      Excl. in [place]? --      Constitutional: Alert and oriented. Well appearing and in no acute distress. Eyes: Conjunctivae are normal. PERRL. EOMI. Head: Atraumatic. Cardiovascular: Normal rate, regular rhythm. Normal S1 and S2.  Good peripheral circulation. Respiratory: Normal respiratory effort without tachypnea or retractions. Lungs CTAB. Good air entry to the bases with no decreased or absent breath sounds Gastrointestinal: Bowel sounds x 4 quadrants. Soft and nontender to palpation. No guarding or rigidity. No distention. Musculoskeletal: Full range of motion to all extremities. No obvious deformities noted Neurologic:  Normal for age. No gross focal neurologic deficits are appreciated.  Skin: Patient has 4-1/2 cm, semilunar laceration of left lower leg. Psychiatric: Mood and  affect are normal for age. Speech and behavior are normal.   ____________________________________________   LABS (all labs ordered are listed, but only abnormal results are displayed)  Labs Reviewed - No data to display ____________________________________________  EKG   ____________________________________________  RADIOLOGY Unk Pinto, personally viewed and evaluated these images (plain radiographs) as part of my medical decision making, as well as reviewing the written report by the radiologist.  CT Head Wo Contrast  Result Date: 11/29/2019 CLINICAL DATA:  Head trauma, fall EXAM: CT HEAD WITHOUT CONTRAST TECHNIQUE: Contiguous axial images were obtained from the base of the skull through the vertex without intravenous contrast. COMPARISON:  None. FINDINGS: Brain: No evidence of acute territorial infarction, hemorrhage, hydrocephalus,extra-axial collection or mass lesion/mass effect. There is dilatation the ventricles and sulci consistent with age-related atrophy. Low-attenuation changes in the deep white matter consistent with small vessel ischemia. Vascular: No hyperdense vessel or unexpected calcification. Skull: The skull is intact. No fracture or focal lesion identified. Sinuses/Orbits: The visualized paranasal sinuses and mastoid air cells are clear. The orbits and globes intact. Other: None Cervical spine: Alignment: Physiologic Skull base and vertebrae: Visualized skull base is intact. No atlanto-occipital dissociation. The vertebral body heights are well maintained. No fracture or pathologic osseous lesion seen. Soft tissues and spinal canal: The visualized paraspinal soft tissues are unremarkable. No prevertebral soft tissue swelling is seen. The spinal canal is grossly unremarkable, no large epidural collection or significant canal narrowing. Disc levels: Mild disc height loss with uncovertebral osteophytes and disc osteophyte complex are most notable at C5-C6 and C6-C7. There  is moderate neural foraminal narrowing and mild central canal stenosis. Upper chest: The lung apices are clear. Thoracic inlet is within normal limits. Other: None IMPRESSION: No acute intracranial abnormality. Findings consistent with age related atrophy and chronic small vessel ischemia No acute fracture or malalignment of the spine. Cervical spine spondylosis most notable at C5-C6 and C6-C7. Electronically Signed   By: Prudencio Pair M.D.   On: 11/29/2019 17:52   CT Cervical Spine Wo Contrast  Result Date: 11/29/2019  CLINICAL DATA:  Head trauma, fall EXAM: CT HEAD WITHOUT CONTRAST TECHNIQUE: Contiguous axial images were obtained from the base of the skull through the vertex without intravenous contrast. COMPARISON:  None. FINDINGS: Brain: No evidence of acute territorial infarction, hemorrhage, hydrocephalus,extra-axial collection or mass lesion/mass effect. There is dilatation the ventricles and sulci consistent with age-related atrophy. Low-attenuation changes in the deep white matter consistent with small vessel ischemia. Vascular: No hyperdense vessel or unexpected calcification. Skull: The skull is intact. No fracture or focal lesion identified. Sinuses/Orbits: The visualized paranasal sinuses and mastoid air cells are clear. The orbits and globes intact. Other: None Cervical spine: Alignment: Physiologic Skull base and vertebrae: Visualized skull base is intact. No atlanto-occipital dissociation. The vertebral body heights are well maintained. No fracture or pathologic osseous lesion seen. Soft tissues and spinal canal: The visualized paraspinal soft tissues are unremarkable. No prevertebral soft tissue swelling is seen. The spinal canal is grossly unremarkable, no large epidural collection or significant canal narrowing. Disc levels: Mild disc height loss with uncovertebral osteophytes and disc osteophyte complex are most notable at C5-C6 and C6-C7. There is moderate neural foraminal narrowing and mild  central canal stenosis. Upper chest: The lung apices are clear. Thoracic inlet is within normal limits. Other: None IMPRESSION: No acute intracranial abnormality. Findings consistent with age related atrophy and chronic small vessel ischemia No acute fracture or malalignment of the spine. Cervical spine spondylosis most notable at C5-C6 and C6-C7. Electronically Signed   By: Prudencio Pair M.D.   On: 11/29/2019 17:52    ____________________________________________    PROCEDURES  Procedure(s) performed:     Marland KitchenMarland KitchenLaceration Repair  Date/Time: 11/29/2019 6:35 PM Performed by: Lannie Fields, PA-C Authorized by: Lannie Fields, PA-C   Consent:    Consent obtained:  Verbal   Consent given by:  Patient   Risks discussed:  Infection, pain, retained foreign body, poor cosmetic result and poor wound healing Anesthesia (see MAR for exact dosages):    Anesthesia method:  Local infiltration   Local anesthetic:  Lidocaine 1% w/o epi Laceration details:    Length (cm):  4.5   Depth (mm):  1 Repair type:    Repair type:  Simple Exploration:    Hemostasis achieved with:  Direct pressure   Wound exploration: entire depth of wound probed and visualized     Contaminated: no   Treatment:    Area cleansed with:  Saline   Amount of cleaning:  Extensive   Irrigation solution:  Sterile saline   Visualized foreign bodies/material removed: no   Skin repair:    Repair method:  Sutures   Suture size:  4-0   Number of sutures:  8 Approximation:    Approximation:  Close Post-procedure details:    Dressing:  Sterile dressing   Patient tolerance of procedure:  Tolerated well, no immediate complications       Medications  lidocaine (XYLOCAINE) 1 % (with pres) injection 5 mL (has no administration in time range)     ____________________________________________   INITIAL IMPRESSION / ASSESSMENT AND PLAN / ED COURSE  Pertinent labs & imaging results that were available during my care of the  patient were reviewed by me and considered in my medical decision making (see chart for details).      Assessment and plan:  Laceration 73 year old male presents to the emergency department with a 4-1/2 cm left lower extremity laceration.  Patient was bradycardic at triage.  Vital signs were otherwise reassuring.  I obtained a  CT head and CT cervical spine as patient experienced a fall after he returned to his home.  CT reveals no evidence of intracranial bleed or C-spine fracture.  Patient tolerated laceration repair well.  He was advised to have external sutures removed after 10 days.  He was discharged with Keflex.  Strict return precautions were given to return to the emergency department with new or worsening symptoms.  All patient questions were answered.   ____________________________________________  FINAL CLINICAL IMPRESSION(S) / ED DIAGNOSES  Final diagnoses:  Laceration of left lower extremity, initial encounter      NEW MEDICATIONS STARTED DURING THIS VISIT:  ED Discharge Orders         Ordered    cephALEXin (KEFLEX) 500 MG capsule  3 times daily     11/29/19 1815              This chart was dictated using voice recognition software/Dragon. Despite best efforts to proofread, errors can occur which can change the meaning. Any change was purely unintentional.     Lannie Fields, PA-C 11/29/19 1837    Carrie Mew, MD 11/29/19 2348

## 2019-11-29 NOTE — ED Triage Notes (Signed)
Pt has no feeling in his left leg and when he was getting his w/c from his [facility], believe he cut it on something at that time. Bleeding is controlled at this time.

## 2019-12-02 ENCOUNTER — Ambulatory Visit: Payer: Self-pay | Admitting: *Deleted

## 2019-12-02 NOTE — Telephone Encounter (Signed)
Summary: Stitches/wound   Pt's wife stated pt had a wound that was stitched up at the ED. She stated the wound started to leak on Sunday and has leaked through the dressing and dried up and is crusty. She would like to speak with someone about changing the dressing until they schedule an appt to have stitches removed. Please advise.      Call to patient\'s wife- instructions given on removal of bandage and wound care. Appointment made for suture removal.  Reason for Disposition . Care of sutured (or stapled) wound,  questions about  Answer Assessment - Initial Assessment Questions 1. LOCATION: "Where are the sutures (or staples) located?"      Stitches are located on calf of left leg 2. NUMBER: "How many sutures (or staples) are there?"      8 sutures 3. DATE IN: "When were the sutures (or staples) put in?"       12 /19 4. DATE OUT: "When did your doctor tell you the sutures (or staples) needed to come out?"     Instructed to have removed 5-10 days 5. OTHER SYMPTOMS: "Do you have any other symptoms?" (e.g., wound pain, discharge, fever?)     Sunday morning patient had some bleeding through gauze- afraid to remove. 6. PREGNANCY: "Is there any chance you are pregnant?" "When was your last menstrual period?"     n/a  Protocols used: SUTURE OR STAPLE QUESTIONS-A-AH

## 2019-12-02 NOTE — Telephone Encounter (Signed)
FYI

## 2019-12-02 NOTE — Telephone Encounter (Signed)
Its normal to have some clear or yellow watery drainage. It should not be thick like pus, which would be an indication of infection.

## 2019-12-08 ENCOUNTER — Ambulatory Visit (INDEPENDENT_AMBULATORY_CARE_PROVIDER_SITE_OTHER): Payer: PPO | Admitting: Physician Assistant

## 2019-12-08 ENCOUNTER — Encounter: Payer: Self-pay | Admitting: Physician Assistant

## 2019-12-08 ENCOUNTER — Other Ambulatory Visit: Payer: Self-pay

## 2019-12-08 VITALS — BP 143/80 | HR 61 | Temp 96.9°F | Wt 231.0 lb

## 2019-12-08 DIAGNOSIS — S81812S Laceration without foreign body, left lower leg, sequela: Secondary | ICD-10-CM | POA: Diagnosis not present

## 2019-12-08 NOTE — Progress Notes (Signed)
Patient: Joseph Macdonald Male    DOB: 1946-10-20   73 y.o.   MRN: [id number] Visit Date: 12/08/2019  Today's Provider: Trinna Post, PA-C   Chief Complaint  Patient presents with  . Suture / Staple Removal   Subjective:     Suture / Staple Removal The sutures were placed 11 to 14 days ago. He tried antibiotic ointment use and regular soap and water washings since the wound repair. The treatment provided mild relief. His temperature was unmeasured prior to arrival. There has been colored and bloody discharge from the wound. There is no redness present. There is no swelling present. There is no pain present.   Patient suffered laceration to left lower extremity on 11/29/2019 and received 8 sutures in the ER as well as keflex. He was advised to follow up with PCP for removal. He is on eliquis for CAD and also has heart failure with lower extremity edema. He reports receiving a tetanus shot in the ER.   No Known Allergies   Current Outpatient Medications:  .  albuterol (PROVENTIL HFA;VENTOLIN HFA) 108 (90 Base) MCG/ACT inhaler, Inhale into the lungs every 6 (six) hours as needed for wheezing or shortness of breath., Disp: , Rfl:  .  apixaban (ELIQUIS) 5 MG TABS tablet, Take 5 mg by mouth 2 (two) times daily., Disp: , Rfl:  .  aspirin EC 81 MG tablet, Take 81 mg by mouth daily., Disp: , Rfl:  .  budesonide-formoterol (SYMBICORT) 160-4.5 MCG/ACT inhaler, Inhale 2 puffs into the lungs 2 (two) times daily., Disp: 1 Inhaler, Rfl: 0 .  calcium-vitamin D (OSCAL 500/200 D-3) 500-200 MG-UNIT tablet, Take 1 tablet by mouth 2 (two) times daily., Disp: 180 tablet, Rfl: 3 .  famotidine (PEPCID) 20 MG tablet, Take 20 mg by mouth as needed for heartburn or indigestion., Disp: , Rfl:  .  flecainide (TAMBOCOR) 50 MG tablet, Take 50 mg by mouth 2 (two) times daily., Disp: , Rfl:  .  gabapentin (NEURONTIN) 300 MG capsule, Take 600 mg by mouth at bedtime., Disp: , Rfl:  .   HYDROcodone-acetaminophen (NORCO/VICODIN) 5-325 MG tablet, Take 1 tablet by mouth every 6 (six) hours as needed for moderate pain., Disp: 120 tablet, Rfl: 0 .  metFORMIN (GLUCOPHAGE) 500 MG tablet, Take by mouth 2 (two) times daily with a meal., Disp: , Rfl:  .  metoprolol tartrate (LOPRESSOR) 25 MG tablet, Take 12.5 mg by mouth 2 (two) times daily. , Disp: , Rfl:  .  mycophenolate (CELLCEPT) 250 MG capsule, Take 1,000 mg by mouth daily. , Disp: , Rfl:  .  omeprazole (PRILOSEC) 40 MG capsule, Take 40 mg by mouth 2 (two) times daily. , Disp: , Rfl:  .  pravastatin (PRAVACHOL) 20 MG tablet, Take 20 mg by mouth daily., Disp: , Rfl:  .  predniSONE (DELTASONE) 20 MG tablet, Take 30-40 mg by mouth daily with breakfast. 30 mg on M,W,F and 40 mg on all other days, Disp: , Rfl:  .  pyridostigmine (MESTINON) 60 MG tablet, Take 60 mg by mouth 3 (three) times daily. , Disp: , Rfl:  .  sulfamethoxazole-trimethoprim (BACTRIM) 400-80 MG tablet, Take 1 tablet by mouth daily., Disp: , Rfl:  .  tiotropium (SPIRIVA) 18 MCG inhalation capsule, Place 18 mcg into inhaler and inhale daily., Disp: , Rfl:  .  vitamin B-12 (CYANOCOBALAMIN) 1000 MCG tablet, Take 1,000 mcg by mouth daily., Disp: , Rfl:  .  amLODipine (NORVASC) 5 MG tablet,  Take 1 tablet (5 mg total) by mouth daily., Disp: 90 tablet, Rfl: 3 .  furosemide (LASIX) 40 MG tablet, TAKE ONE TABLET BY MOUTH DAILY UNTIL SWELLING/BREATHING RETURN TO NORMAL - THEN TAKE AS NEEDED FOR SWELLING/WEIGHT GAIN, Disp: 90 tablet, Rfl: 3 .  potassium chloride SA (K-DUR) 20 MEQ tablet, Take 2 tablets (40 mEq total) by mouth 2 (two) times daily for 5 days., Disp: 20 tablet, Rfl: 0   Review of Systems  Constitutional: Negative.   Respiratory: Negative.   Cardiovascular: Negative.   Musculoskeletal: Negative.     Social History   Tobacco Use  . Smoking status: Former Smoker    Packs/day: 0.50    Years: 40.00    Pack years: 20.00    Types: Pipe, Cigarettes    Quit date:  08/19/2017    Years since quitting: 2.3  . Smokeless tobacco: Former Systems developer  . Tobacco comment: previously smoked 1 1/2 pack since his 73 years old  Substance Use Topics  . Alcohol use: Yes    Comment: occasionally      Objective:   BP (!) 143/80 (BP Location: Right Arm, Patient Position: Sitting, Cuff Size: Normal)   Pulse 61   Temp (!) 96.9 F (36.1 C) (Temporal)   Wt 231 lb (104.8 kg)   BMI 36.18 kg/m  Vitals:   12/08/19 1433  BP: (!) 143/80  Pulse: 61  Temp: (!) 96.9 F (36.1 C)  TempSrc: Temporal  Weight: 231 lb (104.8 kg)  Body mass index is 36.18 kg/m.   Physical Exam Constitutional:      Appearance: Normal appearance.  Skin:    Comments: Semilunar laceration on left lower extremity with 8 visualized simple interrupted sutures. There is some continued oozing. When pulled apart slightly, the laceration is well approximated but does not appear healed. He has bilateral lower 1+ pitting edema.   Neurological:     Mental Status: He is alert.     Media Information   Document Information  Photos  Left leg   12/08/2019 15:05  Attached To:  Office Visit on 12/08/19 with Trinna Post, PA-C  Source Information  Carles Collet M, PA-C  [facility]    No results found for any visits on 12/08/19.     Assessment & Plan    1. Laceration of left lower extremity, sequela  I do not think these sutures are ready to be removed. We will have him follow up in 3 days for recheck. Likely to be slow healing due to chronic disease and lower extremity edema.   The entirety of the information documented in the History of Present Illness, Review of Systems and Physical Exam were personally obtained by me. Portions of this information were initially documented by [facility] [email or web address], CMA and reviewed by me for thoroughness and accuracy.   F/u 3 days        Trinna Post, PA-C  [facility]

## 2019-12-09 NOTE — Patient Instructions (Signed)
Laceration Care, Adult A laceration is a cut that may go through all layers of the skin. The cut may also go into the tissue that is right under the skin. Some cuts heal on their own. Others need to be closed with stitches (sutures), staples, skin adhesive strips, or skin glue. Taking care of your injury lowers your risk of infection, helps your injury to heal better, and may prevent scarring. Supplies needed:  Soap.  Water.  Hand sanitizer.  Bandage (dressing).  Antibiotic ointment.  Clean towel. How to take care of your cut Wash your hands with soap and water before touching your wound or changing your bandage. If soap and water are not available, use hand sanitizer. If your doctor used stitches or staples:  Keep the wound clean and dry.  If you were given a bandage, change it at least once a day as told by your doctor. You should also change it if it gets wet or dirty.  Keep the wound completely dry for the first 24 hours, or as told by your doctor. After that, you may take a shower or a bath. Do not get the wound soaked in water until after the stitches or staples have been removed.  Clean the wound once a day, or as told by your doctor: ? Wash the wound with soap and water. ? Rinse the wound with water to remove all soap. ? Pat the wound dry with a clean towel. Do not rub the wound.  After you clean the wound, put a thin layer of antibiotic ointment on it as told by your doctor. This ointment: ? Helps to prevent infection. ? Keeps the bandage from sticking to the wound.  Have your stitches or staples removed as told by your doctor. If your doctor used skin adhesive strips:  Keep the wound clean and dry.  If you were given a bandage, you should change it at least once a day as told by your doctor. You should also change it if it gets wet or dirty.  Do not get the skin adhesive strips wet. You can take a shower or a bath, but keep the wound dry.  If the wound gets wet,  pat it dry with a clean towel. Do not rub the wound.  Skin adhesive strips fall off on their own. You can trim the strips as the wound heals. Do not remove any strips that are still stuck to the wound. They will fall off after a while. If your doctor used skin glue:  Try to keep your wound dry, but you may briefly wet it in the shower or bath. Do not soak the wound in water, such as by swimming.  After you take a shower or a bath, gently pat the wound dry with a clean towel. Do not rub the wound.  Do not do any activities that will make you really sweaty until the skin glue has fallen off on its own.  Do not apply liquid, cream, or ointment medicine to your wound while the skin glue is still on.  If you were given a bandage, you should change it at least once a day or as told by your doctor. You should also change it if it gets dirty or wet.  If a bandage is placed over the wound, do not let the tape touch the skin glue.  Do not pick at the glue. The skin glue usually stays on for 5-10 days. Then, it falls off the skin. General   instructions   Take over-the-counter and prescription medicines only as told by your doctor.  If you were given antibiotic medicine or ointment, take or apply it as told by your doctor. Do not stop using it even if your condition improves.  Do not scratch or pick at the wound.  Check your wound every day for signs of infection. Watch for: ? Redness, swelling, or pain. ? Fluid, blood, or pus.  Raise (elevate) the injured area above the level of your heart while you are sitting or lying down.  If directed, put ice on the affected area: ? Put ice in a plastic bag. ? Place a towel between your skin and the bag. ? Leave the ice on for 20 minutes, 2-3 times a day.  Prevent scarring by covering your wound with sunscreen of at least 30 SPF whenever you are outside after your wound has healed.  Keep all follow-up visits as told by your doctor. This is  important. Get help if:  You got a tetanus shot and you have any of these problems at the injection site: ? Swelling. ? Very bad pain. ? Redness. ? Bleeding.  You have a fever.  A wound that was closed breaks open.  You notice a bad smell coming from your wound or your bandage.  You notice something coming out of the wound, such as wood or glass.  Medicine does not relieve your pain.  You have more redness, swelling, or pain at the site of your wound.  You have fluid, blood, or pus coming from your wound.  You notice a change in the color of your skin near your wound.  You need to change the bandage often because fluid, blood, or pus is coming from the wound.  You start to have a new rash.  You start to have numbness around the wound. Get help right away if:  You have very bad swelling around the wound.  Your pain suddenly gets worse and is very bad.  You notice painful lumps near the wound or anywhere on your body.  You have a red streak going away from your wound.  The wound is on your hand or foot, and: ? You cannot move a finger or toe. ? Your fingers or toes look pale or bluish. Summary  A laceration is a cut that may go through all layers of the skin. The cut may also go into the tissue right under the skin.  Some cuts heal on their own. Others need to be closed with stitches, staples, skin adhesive strips, or skin glue.  Follow your doctor's instructions for caring for your cut. Proper care of a cut lowers the risk of infection, helps the cut heal better, and prevents scarring. This information is not intended to replace advice given to you by your health care provider. Make sure you discuss any questions you have with your health care provider. Document Released: 05/15/2008 Document Revised: 01/25/2018 Document Reviewed: 12/17/2017 Elsevier Patient Education  2020 [employer].

## 2019-12-11 ENCOUNTER — Other Ambulatory Visit: Payer: Self-pay

## 2019-12-11 ENCOUNTER — Ambulatory Visit (INDEPENDENT_AMBULATORY_CARE_PROVIDER_SITE_OTHER): Payer: PPO | Admitting: Physician Assistant

## 2019-12-11 ENCOUNTER — Encounter: Payer: Self-pay | Admitting: Physician Assistant

## 2019-12-11 VITALS — BP 147/72 | HR 45 | Temp 97.6°F | Resp 16 | Wt 236.0 lb

## 2019-12-11 DIAGNOSIS — M9979 Connective tissue and disc stenosis of intervertebral foramina of abdomen and other regions: Secondary | ICD-10-CM | POA: Diagnosis not present

## 2019-12-11 DIAGNOSIS — S81812S Laceration without foreign body, left lower leg, sequela: Secondary | ICD-10-CM

## 2019-12-11 MED ORDER — HYDROCODONE-ACETAMINOPHEN 5-325 MG PO TABS: 1.0000 | ORAL_TABLET | Freq: Four times a day (QID) | ORAL | 0 refills | Status: DC | PRN

## 2019-12-11 NOTE — Progress Notes (Signed)
Patient: Joseph Macdonald Male    DOB: [birth year]   73 y.o.   MRN: [id number] Visit Date: 12/11/2019  Today's Provider: Mar Daring, PA-C   Chief Complaint  Patient presents with  . Follow-up   Subjective:     HPI Patient here today to have sutures removed. Patient was last seen on 12/08/2019 and sutures were not ready to be removed. Patient had slow healing due to chronic disease and lower extremity edema. Patient reports lesion is about the same as on Monday.  No Known Allergies   Current Outpatient Medications:  .  albuterol (PROVENTIL HFA;VENTOLIN HFA) 108 (90 Base) MCG/ACT inhaler, Inhale into the lungs every 6 (six) hours as needed for wheezing or shortness of breath., Disp: , Rfl:  .  apixaban (ELIQUIS) 5 MG TABS tablet, Take 5 mg by mouth 2 (two) times daily., Disp: , Rfl:  .  aspirin EC 81 MG tablet, Take 81 mg by mouth daily., Disp: , Rfl:  .  budesonide-formoterol (SYMBICORT) 160-4.5 MCG/ACT inhaler, Inhale 2 puffs into the lungs 2 (two) times daily., Disp: 1 Inhaler, Rfl: 0 .  calcium-vitamin D (OSCAL 500/200 D-3) 500-200 MG-UNIT tablet, Take 1 tablet by mouth 2 (two) times daily., Disp: 180 tablet, Rfl: 3 .  famotidine (PEPCID) 20 MG tablet, Take 20 mg by mouth as needed for heartburn or indigestion., Disp: , Rfl:  .  flecainide (TAMBOCOR) 50 MG tablet, Take 50 mg by mouth 2 (two) times daily., Disp: , Rfl:  .  gabapentin (NEURONTIN) 300 MG capsule, Take 600 mg by mouth at bedtime., Disp: , Rfl:  .  HYDROcodone-acetaminophen (NORCO/VICODIN) 5-325 MG tablet, Take 1 tablet by mouth every 6 (six) hours as needed for moderate pain., Disp: 120 tablet, Rfl: 0 .  metFORMIN (GLUCOPHAGE) 500 MG tablet, Take by mouth 2 (two) times daily with a meal., Disp: , Rfl:  .  metoprolol tartrate (LOPRESSOR) 25 MG tablet, Take 12.5 mg by mouth 2 (two) times daily. , Disp: , Rfl:  .  mycophenolate (CELLCEPT) 250 MG capsule, Take 1,000 mg by mouth daily. , Disp: , Rfl:  .   omeprazole (PRILOSEC) 40 MG capsule, Take 40 mg by mouth 2 (two) times daily. , Disp: , Rfl:  .  pravastatin (PRAVACHOL) 20 MG tablet, Take 20 mg by mouth daily., Disp: , Rfl:  .  predniSONE (DELTASONE) 20 MG tablet, Take 30-40 mg by mouth daily with breakfast. 30 mg on M,W,F and 40 mg on all other days, Disp: , Rfl:  .  pyridostigmine (MESTINON) 60 MG tablet, Take 60 mg by mouth 3 (three) times daily. , Disp: , Rfl:  .  sulfamethoxazole-trimethoprim (BACTRIM) 400-80 MG tablet, Take 1 tablet by mouth daily., Disp: , Rfl:  .  tiotropium (SPIRIVA) 18 MCG inhalation capsule, Place 18 mcg into inhaler and inhale daily., Disp: , Rfl:  .  vitamin B-12 (CYANOCOBALAMIN) 1000 MCG tablet, Take 1,000 mcg by mouth daily., Disp: , Rfl:  .  amLODipine (NORVASC) 5 MG tablet, Take 1 tablet (5 mg total) by mouth daily., Disp: 90 tablet, Rfl: 3 .  potassium chloride SA (K-DUR) 20 MEQ tablet, Take 2 tablets (40 mEq total) by mouth 2 (two) times daily for 5 days., Disp: 20 tablet, Rfl: 0  Review of Systems  Constitutional: Negative for fever.  Respiratory: Negative.   Cardiovascular: Negative.   Musculoskeletal: Negative.   Skin: Positive for wound. Negative for color change.  Neurological: Positive for numbness.  Social History   Tobacco Use  . Smoking status: Former Smoker    Packs/day: 0.50    Years: 40.00    Pack years: 20.00    Types: Pipe, Cigarettes    Quit date: 08/19/2017    Years since quitting: 2.3  . Smokeless tobacco: Former Systems developer  . Tobacco comment: previously smoked 1 1/2 pack since his 73 years old  Substance Use Topics  . Alcohol use: Yes    Comment: occasionally      Objective:   BP (!) 147/72 (BP Location: Left Arm, Patient Position: Sitting, Cuff Size: Large)   Pulse (!) 45   Temp 97.6 F (36.4 C) (Temporal)   Resp 16   Wt 236 lb (107 kg)   BMI 36.96 kg/m  Vitals:   12/11/19 0818  BP: (!) 147/72  Pulse: (!) 45  Resp: 16  Temp: 97.6 F (36.4 C)  TempSrc: Temporal    Weight: 236 lb (107 kg)  Body mass index is 36.96 kg/m.   Physical Exam Vitals reviewed.  Constitutional:      General: He is not in acute distress.    Appearance: Normal appearance. He is well-developed. He is not ill-appearing.  HENT:     Head: Normocephalic and atraumatic.  Eyes:     Conjunctiva/sclera: Conjunctivae normal.  Pulmonary:     Effort: Pulmonary effort is normal. No respiratory distress.  Musculoskeletal:     Cervical back: Normal range of motion and neck supple.  Skin:    Findings: Laceration present.       Neurological:     Mental Status: He is alert.  Psychiatric:        Mood and Affect: Mood normal.        Behavior: Behavior normal.        Thought Content: Thought content normal.        Judgment: Judgment normal.      No results found for any visits on 12/11/19.     Assessment & Plan    1. Laceration of left lower extremity, sequela Sutures removed without issue. Steri-strips applied and dry dressing applied. Advised to wash at least once daily with soap and water. Change dressing as needed for saturation. Return in 1-2 weeks for wound recheck. Call if worsening.  2. Narrowing of intervertebral disc space Stable. Diagnosis pulled for medication refill. Continue current medical treatment plan. - HYDROcodone-acetaminophen (NORCO/VICODIN) 5-325 MG tablet; Take 1 tablet by mouth every 6 (six) hours as needed for moderate pain.  Dispense: 120 tablet; Refill: 0     Mar Daring, PA-C  [facility]

## 2019-12-22 NOTE — Progress Notes (Signed)
Patient: Joseph Macdonald Male    DOB: 05/28/1946   74 y.o.   MRN: [id number] Visit Date: 12/22/2019  Today's Provider: Mar Daring, PA-C   No chief complaint on file.  Subjective:     Wound Check He was originally treated more than 14 days ago. Previous treatment included oral antibiotics. There has been clear discharge from the wound. The redness has improved. The swelling has not changed. The pain has not changed.    Patient here to follow-up laceration.Sutures removed without issue last office visit 12/31. Steri-strips applied and dry dressing applied. Advised to wash at least once daily with soap and water. Change dressing as needed for saturation.  No Known Allergies   Current Outpatient Medications:  .  albuterol (PROVENTIL HFA;VENTOLIN HFA) 108 (90 Base) MCG/ACT inhaler, Inhale into the lungs every 6 (six) hours as needed for wheezing or shortness of breath., Disp: , Rfl:  .  amLODipine (NORVASC) 5 MG tablet, Take 1 tablet (5 mg total) by mouth daily., Disp: 90 tablet, Rfl: 3 .  apixaban (ELIQUIS) 5 MG TABS tablet, Take 5 mg by mouth 2 (two) times daily., Disp: , Rfl:  .  aspirin EC 81 MG tablet, Take 81 mg by mouth daily., Disp: , Rfl:  .  budesonide-formoterol (SYMBICORT) 160-4.5 MCG/ACT inhaler, Inhale 2 puffs into the lungs 2 (two) times daily., Disp: 1 Inhaler, Rfl: 0 .  calcium-vitamin D (OSCAL 500/200 D-3) 500-200 MG-UNIT tablet, Take 1 tablet by mouth 2 (two) times daily., Disp: 180 tablet, Rfl: 3 .  famotidine (PEPCID) 20 MG tablet, Take 20 mg by mouth as needed for heartburn or indigestion., Disp: , Rfl:  .  flecainide (TAMBOCOR) 50 MG tablet, Take 50 mg by mouth 2 (two) times daily., Disp: , Rfl:  .  gabapentin (NEURONTIN) 300 MG capsule, Take 600 mg by mouth at bedtime., Disp: , Rfl:  .  HYDROcodone-acetaminophen (NORCO/VICODIN) 5-325 MG tablet, Take 1 tablet by mouth every 6 (six) hours as needed for moderate pain., Disp: 120 tablet, Rfl: 0 .   metFORMIN (GLUCOPHAGE) 500 MG tablet, Take by mouth 2 (two) times daily with a meal., Disp: , Rfl:  .  metoprolol tartrate (LOPRESSOR) 25 MG tablet, Take 12.5 mg by mouth 2 (two) times daily. , Disp: , Rfl:  .  mycophenolate (CELLCEPT) 250 MG capsule, Take 1,000 mg by mouth daily. , Disp: , Rfl:  .  omeprazole (PRILOSEC) 40 MG capsule, Take 40 mg by mouth 2 (two) times daily. , Disp: , Rfl:  .  potassium chloride SA (K-DUR) 20 MEQ tablet, Take 2 tablets (40 mEq total) by mouth 2 (two) times daily for 5 days., Disp: 20 tablet, Rfl: 0 .  pravastatin (PRAVACHOL) 20 MG tablet, Take 20 mg by mouth daily., Disp: , Rfl:  .  predniSONE (DELTASONE) 20 MG tablet, Take 30-40 mg by mouth daily with breakfast. 30 mg on M,W,F and 40 mg on all other days, Disp: , Rfl:  .  pyridostigmine (MESTINON) 60 MG tablet, Take 60 mg by mouth 3 (three) times daily. , Disp: , Rfl:  .  sulfamethoxazole-trimethoprim (BACTRIM) 400-80 MG tablet, Take 1 tablet by mouth daily., Disp: , Rfl:  .  tiotropium (SPIRIVA) 18 MCG inhalation capsule, Place 18 mcg into inhaler and inhale daily., Disp: , Rfl:  .  vitamin B-12 (CYANOCOBALAMIN) 1000 MCG tablet, Take 1,000 mcg by mouth daily., Disp: , Rfl:   Review of Systems  Constitutional: Negative.   Respiratory:  Negative.   Cardiovascular: Negative.   Skin: Positive for wound.  Neurological: Positive for numbness.    Social History   Tobacco Use  . Smoking status: Former Smoker    Packs/day: 0.50    Years: 40.00    Pack years: 20.00    Types: Pipe, Cigarettes    Quit date: 08/19/2017    Years since quitting: 2.3  . Smokeless tobacco: Former Systems developer  . Tobacco comment: previously smoked 1 1/2 pack since his 74 years old  Substance Use Topics  . Alcohol use: Yes    Comment: occasionally      Objective:   There were no vitals taken for this visit. There were no vitals filed for this visit.There is no height or weight on file to calculate BMI.   Physical Exam Vitals  reviewed.  Constitutional:      General: He is not in acute distress.    Appearance: Normal appearance. He is well-developed. He is obese. He is not ill-appearing or diaphoretic.  HENT:     Head: Normocephalic and atraumatic.  Cardiovascular:     Rate and Rhythm: Normal rate and regular rhythm.     Pulses: Normal pulses.     Heart sounds: Normal heart sounds. No murmur. No friction rub. No gallop.   Pulmonary:     Effort: Pulmonary effort is normal. No respiratory distress.     Breath sounds: Normal breath sounds. No wheezing or rales.  Musculoskeletal:     Cervical back: Normal range of motion and neck supple.     Right lower leg: Edema (1-2+ pitting) present.     Left lower leg: Edema (1-2+ pitting) present.  Skin:    Findings: Wound present.          Comments: Some venous stasis discoloration noted of the left and right lower extremities from the knee down (L>R) but at baseline  Neurological:     Mental Status: He is alert.      No results found for any visits on 12/25/19.     Assessment & Plan    1. Laceration of left lower extremity, sequela Expectant healing. Advised to continue to keep clean. Monitor for signs/symptoms of infection. Discussed prolonged healing expected due to chronic issues. He and wife voice understanding. Call if worsening or changes.     Mar Daring, PA-C  [facility]

## 2019-12-25 ENCOUNTER — Other Ambulatory Visit: Payer: Self-pay

## 2019-12-25 ENCOUNTER — Ambulatory Visit (INDEPENDENT_AMBULATORY_CARE_PROVIDER_SITE_OTHER): Payer: PPO | Admitting: Physician Assistant

## 2019-12-25 ENCOUNTER — Encounter: Payer: Self-pay | Admitting: Physician Assistant

## 2019-12-25 VITALS — BP 119/66 | HR 65 | Temp 97.5°F | Resp 22 | Ht 67.0 in | Wt 234.8 lb

## 2019-12-25 DIAGNOSIS — S81812S Laceration without foreign body, left lower leg, sequela: Secondary | ICD-10-CM | POA: Diagnosis not present

## 2020-01-02 ENCOUNTER — Other Ambulatory Visit: Payer: Self-pay

## 2020-01-02 ENCOUNTER — Ambulatory Visit (INDEPENDENT_AMBULATORY_CARE_PROVIDER_SITE_OTHER): Payer: PPO | Admitting: Family

## 2020-01-02 ENCOUNTER — Encounter: Payer: Self-pay | Admitting: Family

## 2020-01-02 VITALS — BP 120/70 | HR 70 | Ht 67.0 in | Wt 236.0 lb

## 2020-01-02 DIAGNOSIS — I10 Essential (primary) hypertension: Secondary | ICD-10-CM | POA: Diagnosis not present

## 2020-01-02 DIAGNOSIS — I5032 Chronic diastolic (congestive) heart failure: Secondary | ICD-10-CM

## 2020-01-02 DIAGNOSIS — R600 Localized edema: Secondary | ICD-10-CM | POA: Diagnosis not present

## 2020-01-02 DIAGNOSIS — R0609 Other forms of dyspnea: Secondary | ICD-10-CM

## 2020-01-02 DIAGNOSIS — I472 Ventricular tachycardia, unspecified: Secondary | ICD-10-CM

## 2020-01-02 DIAGNOSIS — R0600 Dyspnea, unspecified: Secondary | ICD-10-CM

## 2020-01-02 NOTE — Patient Instructions (Addendum)
Medication Instructions:  Your physician has recommended you make the following change in your medication:   You may take your Furosemide (Lasix) as needed for lower extremity swelling or worsening shortness of breath.   *If you need a refill on your cardiac medications before your next appointment, please call your pharmacy*  Lab Work: None ordered today.  Testing/Procedures: You had an EKG today. It showed sinus rhythm with some early beats in the bottom chambers of your heart which are not dangerous.  Follow-Up: At [facility], you and your health needs are our priority.  As part of our continuing mission to provide you with exceptional heart care, we have created designated Provider Care Teams.  These Care Teams include your primary Cardiologist (physician) and Advanced Practice Providers (APPs -  Physician Assistants and Nurse Practitioners) who all work together to provide you with the care you need, when you need it.  Your next appointment:   6 month(s)  The format for your next appointment:   In Person  Provider:   [name], MD  Other Instructions  Would discuss physical therapy with your primary care provider for strength or balance training.   Recommend compression stockings.  Continue low sodium, heart healthy diet.

## 2020-01-02 NOTE — Progress Notes (Signed)
Office Visit    Patient Name: Joseph Macdonald Date of Encounter: 01/02/2020  Primary Care Provider:  [name], MD Primary Cardiologist:  Nelva Bush, MD Electrophysiologist:  None   Chief Complaint    Joseph Macdonald is a 74 y.o. male with a hx of sustained VT, myasthenia gravis this myasthenia crisis requiring plasma exchange at [facility] 12/2018, HFpEF, HTN, tobacco abuse, HLD presents today for follow-up of HFpEF  Past Medical History    Past Medical History:  Diagnosis Date  . Hyperlipidemia   . Hypertension   . Myasthenia gravis ([place])   . Oxygen deficit   . Ventricular tachycardia [facility])    Past Surgical History:  Procedure Laterality Date  . carpel tunnel sx    . CATARACT EXTRACTION     right eye  . SKIN GRAFT     Behind left knee  . TONSILLECTOMY AND ADENOIDECTOMY      Allergies  No Known Allergies  History of Present Illness    Joseph Macdonald is a 74 y.o. male with a hx of  hx of nonsustained VT, myasthenia gravis this myasthenia crisis requiring plasma exchange at [facility] 12/2018, HFpEF, HTN, tobacco abuse, HLD, lower extremity edema last seen 06/30/2019 by Dr. Rockey Situ.  Present today with his wife.  [facility]  He wears 2.5 L of oxygen at home most of the time as prescribed by the PA.  He does not wear this while he is out as he does not have a portable concentrator  Endorses in September he fell at [facility] and had to call EMT to help them out.  Tells me he has been had PT in the past at the [place].  He was encouraged to follow-up with his primary care regarding getting home health PT for strength and balance training.  No chest pain, pressure, tightness.  Endorses dyspnea on exertion.  Tells me he stopped his potassium he previously was taking on as-needed basis he did not like going to the bathroom at all the time and had multiple issues with hypokalemia.  He does take a potassium supplement.   EKGs/Labs/Other  Studies Reviewed:   The following studies were reviewed today:   EKG:  EKG is ordered today.  The ekg ordered today demonstrates sinus rhythm with PVC and minimal voltage criteria for LVH.  No acute ST/T wave changes.  Recent Labs: 06/10/2019: TSH 0.235 06/16/2019: Magnesium 1.8 08/11/2019: ALT 25; BNP 62.7; BUN 29; Creatinine, Ser 0.89; Hemoglobin 13.8; Platelets 218; Potassium 4.3; Sodium 142  Recent Lipid Panel    Component Value Date/Time   CHOL 171 12/21/2015 0000   HDL 89 (A) 12/21/2015 0000    Home Medications   Current Meds  Medication Sig  . albuterol (PROVENTIL HFA;VENTOLIN HFA) 108 (90 Base) MCG/ACT inhaler Inhale into the lungs every 6 (six) hours as needed for wheezing or shortness of breath.  Marland Kitchen apixaban (ELIQUIS) 5 MG TABS tablet Take 5 mg by mouth 2 (two) times daily.  Marland Kitchen aspirin EC 81 MG tablet Take 81 mg by mouth daily.  . budesonide-formoterol (SYMBICORT) 160-4.5 MCG/ACT inhaler Inhale 2 puffs into the lungs 2 (two) times daily.  . calcium-vitamin D (OSCAL 500/200 D-3) 500-200 MG-UNIT tablet Take 1 tablet by mouth 2 (two) times daily.  . famotidine (PEPCID) 20 MG tablet Take 20 mg by mouth as needed for heartburn or indigestion.  . flecainide (TAMBOCOR) 50 MG tablet Take 50 mg by mouth 2 (two)  times daily.  Marland Kitchen HYDROcodone-acetaminophen (NORCO/VICODIN) 5-325 MG tablet Take 1 tablet by mouth every 6 (six) hours as needed for moderate pain.  . metFORMIN (GLUCOPHAGE) 500 MG tablet Take by mouth 2 (two) times daily with a meal.  . metoprolol tartrate (LOPRESSOR) 25 MG tablet Take 12.5 mg by mouth 2 (two) times daily.   Marland Kitchen omeprazole (PRILOSEC) 40 MG capsule Take 40 mg by mouth 2 (two) times daily.   . potassium chloride (KLOR-CON) 20 MEQ packet Take 20 mEq by mouth daily.  . pramipexole (MIRAPEX) 0.5 MG tablet Take 0.5 mg by mouth at bedtime.  . pravastatin (PRAVACHOL) 20 MG tablet Take 20 mg by mouth daily.  . predniSONE (DELTASONE) 20 MG tablet Take 30-40 mg by mouth daily  with breakfast. 30 mg on M,W,F and 40 mg on all other days  . pyridostigmine (MESTINON) 60 MG tablet Take 60 mg by mouth 3 (three) times daily.   Marland Kitchen sulfamethoxazole-trimethoprim (BACTRIM) 400-80 MG tablet Take 1 tablet by mouth daily.  Marland Kitchen tiotropium (SPIRIVA) 18 MCG inhalation capsule Place 18 mcg into inhaler and inhale daily.  . vitamin B-12 (CYANOCOBALAMIN) 1000 MCG tablet Take 1,000 mcg by mouth daily.    Review of Systems      Review of Systems  Constitution: Negative for chills, fever and malaise/fatigue.  Cardiovascular: Positive for dyspnea on exertion and leg swelling. Negative for chest pain, near-syncope, orthopnea, palpitations and syncope.  Respiratory: Positive for shortness of breath. Negative for cough and wheezing.   Gastrointestinal: Negative for nausea and vomiting.  Neurological: Negative for dizziness, light-headedness and weakness.   All other systems reviewed and are otherwise negative except as noted above.  Physical Exam    VS:  BP 120/70 (BP Location: Left Arm, Patient Position: Sitting, Cuff Size: Normal)   Pulse 70   Ht 5\' 7"  (1.702 m)   Wt 236 lb (107 kg)   SpO2 91%   BMI 36.96 kg/m  , BMI Body mass index is 36.96 kg/m. GEN: Well nourished, overweight,well developed, in no acute distress. HEENT: normal. Neck: Supple, no JVD, carotid bruits, or masses. Cardiac: RRR, no murmurs, rubs, or gallops. No clubbing, cyanosis.  Bilateral lower extremity with 2+ pitting edema to ankle..  Radials/PT 2+ and equal bilaterally.  Respiratory:  Respirations regular and unlabored.  Diminished bases bilaterally GI: Soft, nontender, nondistended, BS + x 4. MS: No deformity or atrophy. Skin: Warm and dry, no rash. Neuro:  Strength and sensation are intact. Psych: Normal affect.  Accessory Clinical Findings    ECG personally reviewed by me today -sinus rhythm 70 bpm with PVC and minimal voltage criteria for LVH- no acute changes.  Assessment & Plan    1. HFpEF -  Appears to be mildly volume overloaded with lower extremity edema and worsening shortness of breath compared to his baseline per his report.  GDMT includes beta-blocker and Lasix as needed.  Has not been taking his Lasix as needed as he had some difficulties with hypokalemia.  Recommended he use Lasix PRN for worsening shortness of breath.  If he needs to use it regularly he will contact us or the [place].  At such time, his potassium dosing may need to be increased. He may benefit from MRA Spironolactone in the future, but will defer at this time. Recommend low-sodium heart healthy diet.  Recommend monitoring fluid intake and limiting to less than 2 L. 2. HTN -BP well controlled.  Continue present antihypertensive regimen. 3. [facility]  Continue  pravastatin.  No myalgias. 4. LE edema -reports this is at his baseline.  Encouraged to elevate his lower extremities when sitting.  Encourage compression stockings when sitting for long periods of time.  Encourage low-sodium diet.  Encouraged him to use as needed Lasix as previously prescribed for worsening swelling. 5. SOB - Multifactorial morbid obesity, long history of tobacco use, COPD, HFpEF. Encouraged PRN Lasix, as above. Encouraged PRN inhalers as prescribed by his PCP.  6. Obesity -Weight loss encouraged. 7. Nonsustained VT - No evidence of recurrence. Continue Metoprolol, [facility]. 8. PVC - Noted on EKG. Asymptomatic. Continue Metoprolol.  Disposition: Follow up in 6 month(s) with Dr. Rockey Situ or APP   Loel Dubonnet, NP 01/02/2020, 1:22 PM

## 2020-01-05 ENCOUNTER — Other Ambulatory Visit: Payer: Self-pay | Admitting: Family Medicine

## 2020-01-06 ENCOUNTER — Other Ambulatory Visit: Payer: Self-pay

## 2020-01-06 ENCOUNTER — Telehealth: Payer: Self-pay | Admitting: Family

## 2020-01-06 ENCOUNTER — Encounter: Payer: Self-pay | Admitting: Family Medicine

## 2020-01-06 NOTE — Telephone Encounter (Signed)
Requested medication (s) are due for refill today: no  Requested medication (s) are on the active medication list: yes  Last refill:  12/09/2019  Future visit scheduled:no  Notes to clinic: last ordered by historical provider Review for refill   Requested Prescriptions  Pending Prescriptions Disp Refills   pramipexole (MIRAPEX) 0.5 MG tablet [Pharmacy Med Name: PRAMIPEXOLE 0.5 MG TABLET] 30 tablet 3    Sig: TAKE ONE TABLET BY MOUTH AT BEDTIME      Neurology:  Parkinsonian Agents Passed - 01/05/2020 11:11 PM      Passed - Last BP in normal range    BP Readings from Last 1 Encounters:  01/02/20 120/70          Passed - Valid encounter within last 12 months    Recent Outpatient Visits           1 week ago Laceration of left lower extremity, sequela   [place], [place]   3 weeks ago Laceration of left lower extremity, sequela   [place], [place], [place]   4 weeks ago Laceration of left lower extremity, sequela   [place], [place], [place]   4 months ago [facility] [name], MD   5 months ago Chronic heart failure with preserved ejection fraction (HFpEF) [facility])   [place], [place]       Future Appointments             In 5 months End, Harrell Gave, MD [facility]

## 2020-01-06 NOTE — Telephone Encounter (Signed)
Dr. Dellis Filbert with [facility] went out to assess patient today and he had some concerns which he would like to speak with provider about. Weight is up to 245 and he has 3+ pitting edema. Patients wife did report he was not taking his lasix because he did not like urinating so much. Dr. Dellis Filbert called because he was trying to palpate his pulse and was getting 40's. He did review previous EKG which showed frequent PVC's and said it could be that. He did get the patient to agree in taking the fluid pill and monitor weight. He wanted to speak with provider and advised I would give her his number and he said to have her call after 3pm. Provided Urban Gibson with his number.

## 2020-01-06 NOTE — Telephone Encounter (Signed)
Called Dr. Dellis Filbert around 4:30PM. He was with a patient and said he will call me back. I gave him my cell phone number to return call.  Patient was seen recently for follow up of HFpEF. Most of his care is managed at the [place]. At that office visit he had bilateral LE 1+ pitting edema and was dyspneic. However, he is on 2.5L of home O2 chronically and does not travel with O2. He had not been taking Lasix as he did not like the restroom trips and reportedly had trouble with his potassium. He was encouraged to resume his Lasix at his recent office visit.   Joseph Dubonnet, NP

## 2020-01-07 NOTE — Telephone Encounter (Signed)
Spoke with Joseph Macdonald wife per [facility].  Per her report he has been swollen across his abdomen and in his legs for the last three days.   Took a Furosemide yesterday after Dr. Dellis Filbert left. He lost quite a bit of fluid. He took another today. Swelling and breathing mildly improved.   Agreeable to come for office visit Monday. Will collect BMET and Reds Vest at that time. Will discuss possible referral to [facility] program for Reds Vest monitoring at home and HF education.  Loel Dubonnet, NP

## 2020-01-07 NOTE — Telephone Encounter (Signed)
Spoke with Dr. Dellis Filbert of [facility]. They are a service that follows people in the home who are chronically ill.   He saw Joseph Macdonald yesterday and noted his weight was up, more dyspneic than prior. Despite being advised to take Lasix daily over the weekend, he had not. Patient is very concerned about recurrent hypokalemia despite having potassium supplementation. He takes his potassium 20 mEq daily despite not taking the Lasix.   Concern that he has HR 40s on palpation, but did note hx of PVC. On review, EKG from office visit was SR rate 70 bpm with PVC in a pattern of trigeminy. Likely where low palpated pulse originated.   Will call Joseph Macdonald this afternoon to discuss his Lasix and weight gain. Hopeful with multiple providers relaying same message he will be agreeable to take his Lasix.  Will discuss referral to [facility] HF monitoring program as he would likely benefit from [place] readings and HF education. Will see if he is agreeable to office visit next week.   Dr. Dellis Filbert will see him next in March, but is willing to assist with care coordination if needed prior.  Loel Dubonnet, NP

## 2020-01-09 ENCOUNTER — Encounter: Payer: Self-pay | Admitting: Family Medicine

## 2020-01-09 MED ORDER — PRAMIPEXOLE DIHYDROCHLORIDE 0.5 MG PO TABS: 0.5000 mg | ORAL_TABLET | Freq: Every day | ORAL | 3 refills | Status: DC

## 2020-01-11 NOTE — Progress Notes (Signed)
Office Visit    Patient Name: Joseph Macdonald Date of Encounter: 01/12/2020  Primary Care Provider:  [name], MD Primary Cardiologist:  Nelva Bush, MD Electrophysiologist:  None   Chief Complaint    Joseph Macdonald is a 74 y.o. male with a hx of  sustained VT, myasthenia gravis this myasthenia crisis requiring plasma exchange at [facility] 12/2018, HFpEF, HTN, tobacco abuse, HLD  presents today for follow up of HFpEF and weight gain.   Past Medical History    Past Medical History:  Diagnosis Date  . Hyperlipidemia   . Hypertension   . Myasthenia gravis ([place])   . Oxygen deficit   . Ventricular tachycardia [facility])    Past Surgical History:  Procedure Laterality Date  . carpel tunnel sx    . CATARACT EXTRACTION     right eye  . SKIN GRAFT     Behind left knee  . TONSILLECTOMY AND ADENOIDECTOMY      Allergies  No Known Allergies  History of Present Illness    Joseph Macdonald is a 74 y.o. male with a hx of  sustained VT, myasthenia gravis this myasthenia crisis requiring plasma exchange at [facility] 12/2018, HFpEF, HTN, tobacco abuse, HLD. He was last seen 01/02/20.  He additionally follows with [facility] health system. Seen by dr. Saunders Revel 03/2019 as second opinion following admission to [facility] 12/2018 for marked weakness consistent with myasthenia crisis. Underwent plasma exchange and noted episodes of NSVT. Referred for Holter monitoring, though when he presented found to be in asymptomatic WCT. Readmitted and started on amiodarone. Cardiac workup included echo, LHC, cardiac MRI with no evidence of cardiomyopathy or significant CAD. His cMRI did have comment regarding asymmetric septal hypertrophy. At visit with Dr. Saunders Revel 03/2019 noted several month to years hx of chest discomfort. More recent increase in exertional dyspnea and LE swelling and rash. His amlodipine was decreased, started on Lasix with reduction in HCTZ, referred to EP. On 05/07/19 he was transitioned to  Torsemide due to abdominal swelling. Seen by Dr. Caryl Comes 05/13/19 with reduction in Amiodarone from 400mg  to 200mg . Hospitalized 06/10/19-06/13/19 with hypokalemia, dehydration. He was still dehydrated at discharge and diuretics were held.  He had bilateral LE duplex 06/2019 which were negative for DVT.   Noted PND and possible sleep apnea but significant anxiety regarding undergoing sleep study and "having something on my face" have precluded further workup.   At office visit 01/02/20 he was taking Lasix "as needed". His amiodarone had been transitioned to Flecainide by the [place] - no documentation available for review. Not taking Lasix -  reported he did not like taking it as he had to be admitted for hypokalemia 06/2019. He was mildly volume overloaded and recommended to use his Lasix. His weight was 236 as compared to dry weight approximate of 232 lbs.  Spoke with Dr. Dellis Filbert of [place] 01/06/20. He is part of a travelling MD service that checks on chronically ill patients. Patients volume status was noted to be markedly elevated and up to 245 lbs on Dr. Assunta Curtis scale.   Wife is again present with him today. He is down 5lb from last office visit. He is much more energetic on exam today. He is wearing his portable O2 which makes dramatic difference in his dyspnea.   Reports his LE edema is improved since seeing Dr. Dellis Filbert. Tells me his "foot was as big as a football". Has been taking Lasix 20mg  daily and K 31mEq  daily. Weighing daily with weights at home stable around 228lb. Tells me it was as high as 231lbs. Reports his DOE is about at his baseline. We discussed holding Lasix on days he has diarrhea, vomiting, or his weight is 226 on his home scale to prevent over-diuresis and dehydration.   We discussed increasing his Metoprolol to reduce PVC burden and decrease HR. Does monitor BP and HR at home and reports it has been "good". Agreeable to increase dose.   EKGs/Labs/Other Studies Reviewed:   The following  studies were reviewed today:  EKG:  EKG is ordered today.  The ekg ordered today demonstrates SR with PVC in pattern of bigeminy  Recent Labs: 06/10/2019: TSH 0.235 06/16/2019: Magnesium 1.8 08/11/2019: ALT 25; BNP 62.7; BUN 29; Creatinine, Ser 0.89; Hemoglobin 13.8; Platelets 218; Potassium 4.3; Sodium 142  Recent Lipid Panel    Component Value Date/Time   CHOL 171 12/21/2015 0000   HDL 89 (A) 12/21/2015 0000    Home Medications   Current Meds  Medication Sig  . albuterol (PROVENTIL HFA;VENTOLIN HFA) 108 (90 Base) MCG/ACT inhaler Inhale into the lungs every 6 (six) hours as needed for wheezing or shortness of breath.  Marland Kitchen apixaban (ELIQUIS) 5 MG TABS tablet Take 5 mg by mouth 2 (two) times daily.  Marland Kitchen aspirin EC 81 MG tablet Take 81 mg by mouth daily.  . budesonide-formoterol (SYMBICORT) 160-4.5 MCG/ACT inhaler Inhale 2 puffs into the lungs 2 (two) times daily.  . famotidine (PEPCID) 20 MG tablet Take 20 mg by mouth as needed for heartburn or indigestion.  . flecainide (TAMBOCOR) 50 MG tablet Take 50 mg by mouth 2 (two) times daily.  . furosemide (LASIX) 20 MG tablet Take 20 mg by mouth daily.  Marland Kitchen HYDROcodone-acetaminophen (NORCO/VICODIN) 5-325 MG tablet Take 1 tablet by mouth every 6 (six) hours as needed for moderate pain.  . metFORMIN (GLUCOPHAGE) 500 MG tablet Take by mouth 2 (two) times daily with a meal.  . omeprazole (PRILOSEC) 40 MG capsule Take 40 mg by mouth 2 (two) times daily.   . potassium chloride (KLOR-CON) 20 MEQ packet Take 20 mEq by mouth daily.  . pramipexole (MIRAPEX) 0.5 MG tablet Take 1 tablet (0.5 mg total) by mouth at bedtime.  . pravastatin (PRAVACHOL) 20 MG tablet Take 20 mg by mouth daily.  . predniSONE (DELTASONE) 20 MG tablet Take 30-40 mg by mouth daily with breakfast. 30 mg on M,W,F and 40 mg on all other days  . pyridostigmine (MESTINON) 60 MG tablet Take 60 mg by mouth 3 (three) times daily.   Marland Kitchen sulfamethoxazole-trimethoprim (BACTRIM) 400-80 MG tablet Take 1  tablet by mouth daily.  Marland Kitchen tiotropium (SPIRIVA) 18 MCG inhalation capsule Place 18 mcg into inhaler and inhale daily.  . vitamin B-12 (CYANOCOBALAMIN) 1000 MCG tablet Take 1,000 mcg by mouth daily.  . [DISCONTINUED] metoprolol tartrate (LOPRESSOR) 25 MG tablet Take 12.5 mg by mouth 2 (two) times daily.     Review of Systems    Review of Systems  Constitution: Negative for chills, fever and malaise/fatigue.  Cardiovascular: Positive for dyspnea on exertion and leg swelling. Negative for chest pain, near-syncope, orthopnea, palpitations and syncope.  Respiratory: Negative for cough, shortness of breath and wheezing.   Gastrointestinal: Negative for nausea and vomiting.  Neurological: Negative for dizziness, light-headedness and weakness.   All other systems reviewed and are otherwise negative except as noted above.  Physical Exam    VS:  BP (!) 136/58 (BP Location: Left Arm, Patient Position: Sitting,  Cuff Size: Normal)   Pulse 94   Ht 5\' 7"  (1.702 m)   Wt 231 lb 4 oz (104.9 kg)   SpO2 98%   BMI 36.22 kg/m  , BMI Body mass index is 36.22 kg/m. GEN: Well nourished, overweight, well developed, in no acute distress. HEENT: normal. Neck: Supple, no JVD, carotid bruits, or masses. Cardiac: RRR, no murmurs, rubs, or gallops. No clubbing, cyanosis. Bilateral lower extremity with 2+ pitting edema from ankle to mid-calf.  Radials 2+ and equal bilaterally.  PT 1+ and equal bilaterally.  Respiratory:  Respirations regular and unlabored, clear to auscultation bilaterally. On 2L home O2. GI: Soft, nontender, nondistended, BS + x 4. MS: No deformity or atrophy. Skin: Warm and dry, no rash. Scar noted to L upper calf from war injury with likely damage to vessels in that region. Neuro:  Strength and sensation are intact. Psych: Normal affect.   Assessment & Plan    1. HFpEF - Previously on Lasix which was switched to Torsemide 04/2019 however subsequently discontinued to to hypokalemia and  dehydration during admission 06/2019. He was subsequently started on PRN Lasix 20mg  by [facility] provider. Last office visit noted to be volume overloaded. Seen by Dr. Dellis Filbert of [facility] in his home a few days after office visit with 3+ pitting edema. Weight down 5lb from last office visit, back to approximate dry weight of 231-232lb. Improved edema back to approximate baseline of 2+.  GDMT includes Metoprolol, Lasix 20mg  daily, KDur 10 Meq daily.   Reds Vest 34% today.   BMET, BNP, CBC today.  Continue Lasix 20mg  daily and KDur 53mEq daily. He will hold Lasix if weight 226lb at home (home dry weight 228 on his scale) or if he has GI illness such as diarrhea or vomiting to prevent dehydration, over-diuresis.  May benefit from addition of Spironolactone in the future.   Low sodium, heart healthy diet. Recommend <2L fluid daily.   Referral placed to [facility] monitoring program.  Educated to call for weight gain of 3lb overnight or 5lb in 1 week   2. HTN - BP well controlled. Continue to monitor at home. Continue present antihypertensive regimen.   3. [facility] - [facility] Continue pravastatin. No myalgias.   4. LE edema - Multifactorial HFpEF, venous insufficiency, old wound to LLE. Continue Lasix, as above. Recommend compression stockings (he does not routinely wear). Recommend elevating lower extremities when sitting.  Reds Vest 34% today suggesting LE edema is more related to venous insufficiency.  5. SOB - Mulifactorial morbid obesity, long hx tobacco use, COPD, HFpEF. Wearing his home O2 in office today. Reports his breathing is at his baseline. Stable 2+ pillow orthopnea, previously recommended for sleep study but has declined due to anxiety about "something on my face".  6. Nonsustained VT - No evidence of recurrence. Continue Metoprolol, [facility]ide by [facility]. Due for follow up with EP, Dr. Caryl Comes.  7. PVC - Noted in pattern of bigeminy on EKG 12/01/20. Again pattern of  bigeminy with rate of 94 bpm. Asymptomatic with no palpitations. Hx of dizziness and lightheadedness. Increase Metoprolol Tartrate to 25mg  BID to reduce PVC burden.  Disposition: BMET/CBC/BNP today. Increase Metoprolol dose. Continue Lasix/K. Referral placed to [facility] HF monitoring program. Schedule overdue follow up with EP, Dr. Caryl Comes. Follow up in 5 month(s) with Dr. Saunders Revel.   Loel Dubonnet, NP 01/12/2020, 11:37 AM

## 2020-01-12 ENCOUNTER — Encounter: Payer: Self-pay | Admitting: Family

## 2020-01-12 ENCOUNTER — Other Ambulatory Visit: Payer: Self-pay

## 2020-01-12 ENCOUNTER — Ambulatory Visit (INDEPENDENT_AMBULATORY_CARE_PROVIDER_SITE_OTHER): Payer: PPO | Admitting: Family

## 2020-01-12 ENCOUNTER — Telehealth: Payer: Self-pay | Admitting: Family

## 2020-01-12 VITALS — BP 136/58 | HR 94 | Ht 67.0 in | Wt 231.2 lb

## 2020-01-12 DIAGNOSIS — R600 Localized edema: Secondary | ICD-10-CM

## 2020-01-12 DIAGNOSIS — R0609 Other forms of dyspnea: Secondary | ICD-10-CM

## 2020-01-12 DIAGNOSIS — I472 Ventricular tachycardia, unspecified: Secondary | ICD-10-CM

## 2020-01-12 DIAGNOSIS — I5032 Chronic diastolic (congestive) heart failure: Secondary | ICD-10-CM | POA: Diagnosis not present

## 2020-01-12 DIAGNOSIS — I10 Essential (primary) hypertension: Secondary | ICD-10-CM

## 2020-01-12 DIAGNOSIS — I493 Ventricular premature depolarization: Secondary | ICD-10-CM | POA: Diagnosis not present

## 2020-01-12 DIAGNOSIS — Z79899 Other long term (current) drug therapy: Secondary | ICD-10-CM

## 2020-01-12 DIAGNOSIS — R0600 Dyspnea, unspecified: Secondary | ICD-10-CM | POA: Diagnosis not present

## 2020-01-12 MED ORDER — METOPROLOL TARTRATE 25 MG PO TABS: 25.0000 mg | ORAL_TABLET | Freq: Two times a day (BID) | ORAL | 2 refills | Status: DC

## 2020-01-12 NOTE — Patient Instructions (Addendum)
Medication Instructions:  Your physician has recommended you make the following change in your medication:   CHANGE your Metoprolol to a full tablet 25mg  twice per day   Continue Lasix 20mg  daily  Continue Potassium 29mEq daily.  *If you need a refill on your cardiac medications before your next appointment, please call your pharmacy*  Lab Work: Your physician recommends that you return for lab work today: BNP, BMET, CBC.  If you have labs (blood work) drawn today and your tests are completely normal, you will receive your results only by: Marland Kitchen MyChart Message (if you have MyChart) OR . A paper copy in the mail If you have any lab test that is abnormal or we need to change your treatment, we will call you to review the results.  Testing/Procedures: Your EKG today showed sinus rhythm with PVCs which are early beats in the bottom chamber of your heart.   Follow-Up: You have [facility]. Someone will call you.  Your physician recommends that you schedule a follow-up appointment with Dr Caryl Comes at your earliest convenience.   At [facility], you and your health needs are our priority.  As part of our continuing mission to provide you with exceptional heart care, we have created designated Provider Care Teams.  These Care Teams include your primary Cardiologist (physician) and Advanced Practice Providers (APPs -  Physician Assistants and Nurse Practitioners) who all work together to provide you with the care you need, when you need it.  Your next appointment:   In July with Dr. Saunders Revel as previously scheduled.  Other Instructions  Days to skip Lasix and Potassium:   Days where you have lots of diarrhea or GI upset such as vomiting.  Days where your weight is 126 lbs or less.   We will send a referral for [facility] Heart Failure monitoring program.

## 2020-01-12 NOTE — Telephone Encounter (Signed)
[place] Visit Initial Request  Date of Request ([place]):  January 12, 2020  Requesting Provider:  Laurann Montana, NP    Agency Requested:    [facility] Contact:  Joseph Macdonald, [place], [place], [place], Joseph Macdonald  [id number] [email or web address]  Phone #: [phone] Fax #: [phone]  Patient Demographic Information: Name:  Joseph Macdonald Age:  74 y.o.   DOB:  [birth year]  MRN:  [id number]   Address:   [address] [place] [address]   Phone Numbers:   Home Phone [phone]  Mobile [phone]     Emergency Contact Information on File:   Contact Information    Name Relation Home Work [place] Spouse [phone]  [phone]      The above family members may be contacted for information on this patient (review DPR on file):  Yes    Patient Clinical Information:  Primary Care Provider:  [name], MD  Primary Cardiologist:  Joseph Bush, MD  Primary Electrophysiologist:  None   Past Medical Hx: Joseph Macdonald  has a past medical history of Hyperlipidemia, Hypertension, Myasthenia gravis (Joseph Macdonald), Oxygen deficit, and Ventricular tachycardia ([place]).   Allergies: He has No Known Allergies.   Medications: Current Outpatient Medications on File Prior to Visit  Medication Sig  . albuterol (PROVENTIL HFA;VENTOLIN HFA) 108 (90 Base) MCG/ACT inhaler Inhale into the lungs every 6 (six) hours as needed for wheezing or shortness of breath.  Marland Kitchen apixaban (ELIQUIS) 5 MG TABS tablet Take 5 mg by mouth 2 (two) times daily.  Marland Kitchen aspirin EC 81 MG tablet Take 81 mg by mouth daily.  . budesonide-formoterol (SYMBICORT) 160-4.5 MCG/ACT inhaler Inhale 2 puffs into the lungs 2 (two) times daily.  . famotidine (PEPCID) 20 MG tablet Take 20 mg by mouth as needed for heartburn or indigestion.  . flecainide (TAMBOCOR) 50 MG tablet Take 50 mg by mouth 2 (two) times daily.  . furosemide  (LASIX) 20 MG tablet Take 20 mg by mouth daily.  Marland Kitchen HYDROcodone-acetaminophen (NORCO/VICODIN) 5-325 MG tablet Take 1 tablet by mouth every 6 (six) hours as needed for moderate pain.  . metFORMIN (GLUCOPHAGE) 500 MG tablet Take by mouth 2 (two) times daily with a meal.  . metoprolol tartrate (LOPRESSOR) 25 MG tablet Take 1 tablet (25 mg total) by mouth 2 (two) times daily.  Marland Kitchen omeprazole (PRILOSEC) 40 MG capsule Take 40 mg by mouth 2 (two) times daily.   . potassium chloride (KLOR-CON) 20 MEQ packet Take 20 mEq by mouth daily.  . pramipexole (MIRAPEX) 0.5 MG tablet Take 1 tablet (0.5 mg total) by mouth at bedtime.  . pravastatin (PRAVACHOL) 20 MG tablet Take 20 mg by mouth daily.  . predniSONE (DELTASONE) 20 MG tablet Take 30-40 mg by mouth daily with breakfast. 30 mg on M,W,F and 40 mg on all other days  . pyridostigmine (MESTINON) 60 MG tablet Take 60 mg by mouth 3 (three) times daily.   Marland Kitchen sulfamethoxazole-trimethoprim (BACTRIM) 400-80 MG tablet Take 1 tablet by mouth daily.  Marland Kitchen tiotropium (SPIRIVA) 18 MCG inhalation capsule Place 18 mcg into inhaler and inhale daily.  . vitamin B-12 (CYANOCOBALAMIN) 1000 MCG tablet Take 1,000 mcg by mouth daily.   No current facility-administered medications on file prior to visit.     Social Hx: He  reports that he quit smoking about 2 years ago. His smoking use included pipe and cigarettes. He has a 20.00 pack-year  smoking history. He has quit using smokeless tobacco. He reports current alcohol use. He reports that he does not use drugs.    Diagnosis/Reason for Visit:    HFpEF  Joseph Macdonald is a 74 yo male with PMH VT, HFpEF, HTN, tobacco abuse, COPD. He was seen 01/02/20 and noted to be mildly volume overloaded. Seen by Dr. Dellis Filbert of [facility] 01/05/20 and noted to be up at least 10lb with marked 3+ LE edema. His Lasix was resumed (previous not taking as patient was fearful of hypokalemia). At follow up 01/12/20 noted to be down 5lb from 01/05/20 and at  approximate dry weight of 231lb with Reds Vest of 34%. Referral placed for continued volume monitoring, heart failure education.  Services Requested:  Vital Signs (BP, Pulse, O2, Weight)  Physical Exam  Medication Reconciliation  ReDS Heart Failure Measurement  Assess for Other Rehab/Nursing Needs  # of Visits Needed/Frequency per Week: 6 visits / every other week (or more frequent as determined by initial assessment in home)  YES - A copy of the office note will be faxed with this form. N/A - All labs ordered for this home visit have been released and the request was sent to Chrissie Noa at [facility].

## 2020-01-13 LAB — CBC WITH DIFFERENTIAL/PLATELET
Basophils Absolute: 0.1 10*3/uL (ref 0.0–0.2)
Basos: 0 %
EOS (ABSOLUTE): 0 10*3/uL (ref 0.0–0.4)
Eos: 0 %
Hematocrit: 47.2 % (ref 37.5–51.0)
Hemoglobin: 15.6 g/dL (ref 13.0–17.7)
Immature Grans (Abs): 0.1 10*3/uL (ref 0.0–0.1)
Immature Granulocytes: 1 %
Lymphocytes Absolute: 2 10*3/uL (ref 0.7–3.1)
Lymphs: 15 %
MCH: 30.7 pg (ref 26.6–33.0)
MCHC: 33.1 g/dL (ref 31.5–35.7)
MCV: 93 fL (ref 79–97)
Monocytes Absolute: 0.8 10*3/uL (ref 0.1–0.9)
Monocytes: 6 %
Neutrophils Absolute: 10.3 10*3/uL — ABNORMAL HIGH (ref 1.4–7.0)
Neutrophils: 78 %
Platelets: 207 10*3/uL (ref 150–450)
RBC: 5.08 x10E6/uL (ref 4.14–5.80)
RDW: 12.4 % (ref 11.6–15.4)
WBC: 13.3 10*3/uL — ABNORMAL HIGH (ref 3.4–10.8)

## 2020-01-13 LAB — BASIC METABOLIC PANEL
BUN/Creatinine Ratio: 23 (ref 10–24)
BUN: 21 mg/dL (ref 8–27)
CO2: 32 mmol/L — ABNORMAL HIGH (ref 20–29)
Calcium: 8.9 mg/dL (ref 8.6–10.2)
Chloride: 101 mmol/L (ref 96–106)
Creatinine, Ser: 0.92 mg/dL (ref 0.76–1.27)
GFR calc Af Amer: 96 mL/min/{1.73_m2} (ref >59)
GFR calc non Af Amer: 83 mL/min/{1.73_m2} (ref >59)
Glucose: 113 mg/dL — ABNORMAL HIGH (ref 65–99)
Potassium: 4.4 mmol/L (ref 3.5–5.2)
Sodium: 147 mmol/L — ABNORMAL HIGH (ref 134–144)

## 2020-01-13 LAB — BRAIN NATRIURETIC PEPTIDE

## 2020-01-14 ENCOUNTER — Other Ambulatory Visit: Payer: Self-pay

## 2020-01-14 DIAGNOSIS — M9979 Connective tissue and disc stenosis of intervertebral foramina of abdomen and other regions: Secondary | ICD-10-CM

## 2020-01-14 MED ORDER — HYDROCODONE-ACETAMINOPHEN 5-325 MG PO TABS: 1.0000 | ORAL_TABLET | Freq: Four times a day (QID) | ORAL | 0 refills | Status: DC | PRN

## 2020-01-14 NOTE — Telephone Encounter (Signed)
Patient's wife sent [facility] message requesting refills.

## 2020-01-16 ENCOUNTER — Telehealth: Payer: Self-pay | Admitting: Cardiovascular Disease

## 2020-01-16 DIAGNOSIS — Z7984 Long term (current) use of oral hypoglycemic drugs: Secondary | ICD-10-CM | POA: Diagnosis not present

## 2020-01-16 DIAGNOSIS — Z87891 Personal history of nicotine dependence: Secondary | ICD-10-CM | POA: Diagnosis not present

## 2020-01-16 DIAGNOSIS — G4700 Insomnia, unspecified: Secondary | ICD-10-CM | POA: Diagnosis not present

## 2020-01-16 DIAGNOSIS — J449 Chronic obstructive pulmonary disease, unspecified: Secondary | ICD-10-CM | POA: Diagnosis not present

## 2020-01-16 DIAGNOSIS — Z9181 History of falling: Secondary | ICD-10-CM | POA: Diagnosis not present

## 2020-01-16 DIAGNOSIS — I5030 Unspecified diastolic (congestive) heart failure: Secondary | ICD-10-CM | POA: Diagnosis not present

## 2020-01-16 DIAGNOSIS — I472 Ventricular tachycardia: Secondary | ICD-10-CM | POA: Diagnosis not present

## 2020-01-16 DIAGNOSIS — Z9981 Dependence on supplemental oxygen: Secondary | ICD-10-CM | POA: Diagnosis not present

## 2020-01-16 DIAGNOSIS — E119 Type 2 diabetes mellitus without complications: Secondary | ICD-10-CM | POA: Diagnosis not present

## 2020-01-16 DIAGNOSIS — I110 Hypertensive heart disease with heart failure: Secondary | ICD-10-CM | POA: Diagnosis not present

## 2020-01-16 DIAGNOSIS — E785 Hyperlipidemia, unspecified: Secondary | ICD-10-CM | POA: Diagnosis not present

## 2020-01-16 NOTE — Telephone Encounter (Signed)
Left detailed voicemail message that they could fax orders to our office for Dr. Rockey Situ to sign and to call back if any further questions.

## 2020-01-16 NOTE — Telephone Encounter (Signed)
Home Health nurse calling for orders :  [facility] for skilled nurse 2 x weekly x 4 weeks     For disease management chf   PT / OT Evaluate and treat

## 2020-01-27 DIAGNOSIS — E785 Hyperlipidemia, unspecified: Secondary | ICD-10-CM | POA: Diagnosis not present

## 2020-01-27 DIAGNOSIS — Z87891 Personal history of nicotine dependence: Secondary | ICD-10-CM | POA: Diagnosis not present

## 2020-01-27 DIAGNOSIS — G4700 Insomnia, unspecified: Secondary | ICD-10-CM | POA: Diagnosis not present

## 2020-01-27 DIAGNOSIS — E119 Type 2 diabetes mellitus without complications: Secondary | ICD-10-CM | POA: Diagnosis not present

## 2020-01-27 DIAGNOSIS — I5030 Unspecified diastolic (congestive) heart failure: Secondary | ICD-10-CM | POA: Diagnosis not present

## 2020-01-27 DIAGNOSIS — I110 Hypertensive heart disease with heart failure: Secondary | ICD-10-CM | POA: Diagnosis not present

## 2020-01-27 DIAGNOSIS — Z7984 Long term (current) use of oral hypoglycemic drugs: Secondary | ICD-10-CM | POA: Diagnosis not present

## 2020-01-27 DIAGNOSIS — J449 Chronic obstructive pulmonary disease, unspecified: Secondary | ICD-10-CM | POA: Diagnosis not present

## 2020-01-27 DIAGNOSIS — Z9181 History of falling: Secondary | ICD-10-CM | POA: Diagnosis not present

## 2020-01-27 DIAGNOSIS — Z9981 Dependence on supplemental oxygen: Secondary | ICD-10-CM | POA: Diagnosis not present

## 2020-01-27 DIAGNOSIS — I472 Ventricular tachycardia: Secondary | ICD-10-CM | POA: Diagnosis not present

## 2020-02-11 DIAGNOSIS — G4700 Insomnia, unspecified: Secondary | ICD-10-CM | POA: Diagnosis not present

## 2020-02-11 DIAGNOSIS — Z9181 History of falling: Secondary | ICD-10-CM | POA: Diagnosis not present

## 2020-02-11 DIAGNOSIS — Z7984 Long term (current) use of oral hypoglycemic drugs: Secondary | ICD-10-CM | POA: Diagnosis not present

## 2020-02-11 DIAGNOSIS — I110 Hypertensive heart disease with heart failure: Secondary | ICD-10-CM | POA: Diagnosis not present

## 2020-02-11 DIAGNOSIS — E119 Type 2 diabetes mellitus without complications: Secondary | ICD-10-CM | POA: Diagnosis not present

## 2020-02-11 DIAGNOSIS — E785 Hyperlipidemia, unspecified: Secondary | ICD-10-CM | POA: Diagnosis not present

## 2020-02-11 DIAGNOSIS — J449 Chronic obstructive pulmonary disease, unspecified: Secondary | ICD-10-CM | POA: Diagnosis not present

## 2020-02-11 DIAGNOSIS — I5030 Unspecified diastolic (congestive) heart failure: Secondary | ICD-10-CM | POA: Diagnosis not present

## 2020-02-11 DIAGNOSIS — I472 Ventricular tachycardia: Secondary | ICD-10-CM | POA: Diagnosis not present

## 2020-02-11 DIAGNOSIS — Z87891 Personal history of nicotine dependence: Secondary | ICD-10-CM | POA: Diagnosis not present

## 2020-02-11 DIAGNOSIS — Z9981 Dependence on supplemental oxygen: Secondary | ICD-10-CM | POA: Diagnosis not present

## 2020-02-17 ENCOUNTER — Other Ambulatory Visit: Payer: Self-pay

## 2020-02-17 ENCOUNTER — Encounter: Payer: Self-pay | Admitting: Family

## 2020-02-17 DIAGNOSIS — Z79899 Other long term (current) drug therapy: Secondary | ICD-10-CM

## 2020-02-17 DIAGNOSIS — I5032 Chronic diastolic (congestive) heart failure: Secondary | ICD-10-CM

## 2020-02-17 DIAGNOSIS — M9979 Connective tissue and disc stenosis of intervertebral foramina of abdomen and other regions: Secondary | ICD-10-CM

## 2020-02-17 MED ORDER — HYDROCODONE-ACETAMINOPHEN 5-325 MG PO TABS: 1.0000 | ORAL_TABLET | Freq: Four times a day (QID) | ORAL | 0 refills | Status: DC | PRN

## 2020-02-18 MED ORDER — FUROSEMIDE 40 MG PO TABS: 40.0000 mg | ORAL_TABLET | Freq: Every day | ORAL | 0 refills | Status: DC

## 2020-02-18 NOTE — Telephone Encounter (Signed)
Okay to refill Lasix 40mg  one tablet daily if that is what he has been taking. Unclear where dose change came from. Please have him come to [facility] for BMET.   Will you please confirm what dose of potassium he is taking? Want to be sure it isn't low if he's taking more Lasix.   Thanks! Loel Dubonnet, NP

## 2020-02-23 DIAGNOSIS — I5030 Unspecified diastolic (congestive) heart failure: Secondary | ICD-10-CM | POA: Diagnosis not present

## 2020-02-25 DIAGNOSIS — I5030 Unspecified diastolic (congestive) heart failure: Secondary | ICD-10-CM | POA: Diagnosis not present

## 2020-02-25 DIAGNOSIS — E119 Type 2 diabetes mellitus without complications: Secondary | ICD-10-CM | POA: Diagnosis not present

## 2020-02-25 DIAGNOSIS — Z7984 Long term (current) use of oral hypoglycemic drugs: Secondary | ICD-10-CM | POA: Diagnosis not present

## 2020-02-25 DIAGNOSIS — Z9181 History of falling: Secondary | ICD-10-CM | POA: Diagnosis not present

## 2020-02-25 DIAGNOSIS — Z87891 Personal history of nicotine dependence: Secondary | ICD-10-CM | POA: Diagnosis not present

## 2020-02-25 DIAGNOSIS — E785 Hyperlipidemia, unspecified: Secondary | ICD-10-CM | POA: Diagnosis not present

## 2020-02-25 DIAGNOSIS — Z9981 Dependence on supplemental oxygen: Secondary | ICD-10-CM | POA: Diagnosis not present

## 2020-02-25 DIAGNOSIS — I110 Hypertensive heart disease with heart failure: Secondary | ICD-10-CM | POA: Diagnosis not present

## 2020-02-25 DIAGNOSIS — J449 Chronic obstructive pulmonary disease, unspecified: Secondary | ICD-10-CM | POA: Diagnosis not present

## 2020-02-25 DIAGNOSIS — I472 Ventricular tachycardia: Secondary | ICD-10-CM | POA: Diagnosis not present

## 2020-02-25 DIAGNOSIS — G4700 Insomnia, unspecified: Secondary | ICD-10-CM | POA: Diagnosis not present

## 2020-03-03 DIAGNOSIS — L570 Actinic keratosis: Secondary | ICD-10-CM | POA: Diagnosis not present

## 2020-03-03 DIAGNOSIS — C44629 Squamous cell carcinoma of skin of left upper limb, including shoulder: Secondary | ICD-10-CM | POA: Diagnosis not present

## 2020-03-03 DIAGNOSIS — B354 Tinea corporis: Secondary | ICD-10-CM | POA: Diagnosis not present

## 2020-03-03 DIAGNOSIS — D485 Neoplasm of uncertain behavior of skin: Secondary | ICD-10-CM | POA: Diagnosis not present

## 2020-03-04 ENCOUNTER — Encounter: Payer: Self-pay | Admitting: Family

## 2020-03-11 DIAGNOSIS — E785 Hyperlipidemia, unspecified: Secondary | ICD-10-CM | POA: Diagnosis not present

## 2020-03-11 DIAGNOSIS — Z9981 Dependence on supplemental oxygen: Secondary | ICD-10-CM | POA: Diagnosis not present

## 2020-03-11 DIAGNOSIS — I472 Ventricular tachycardia: Secondary | ICD-10-CM | POA: Diagnosis not present

## 2020-03-11 DIAGNOSIS — Z87891 Personal history of nicotine dependence: Secondary | ICD-10-CM | POA: Diagnosis not present

## 2020-03-11 DIAGNOSIS — G4700 Insomnia, unspecified: Secondary | ICD-10-CM | POA: Diagnosis not present

## 2020-03-11 DIAGNOSIS — J449 Chronic obstructive pulmonary disease, unspecified: Secondary | ICD-10-CM | POA: Diagnosis not present

## 2020-03-11 DIAGNOSIS — I5030 Unspecified diastolic (congestive) heart failure: Secondary | ICD-10-CM | POA: Diagnosis not present

## 2020-03-11 DIAGNOSIS — I110 Hypertensive heart disease with heart failure: Secondary | ICD-10-CM | POA: Diagnosis not present

## 2020-03-11 DIAGNOSIS — Z9181 History of falling: Secondary | ICD-10-CM | POA: Diagnosis not present

## 2020-03-11 DIAGNOSIS — E119 Type 2 diabetes mellitus without complications: Secondary | ICD-10-CM | POA: Diagnosis not present

## 2020-03-11 DIAGNOSIS — Z7984 Long term (current) use of oral hypoglycemic drugs: Secondary | ICD-10-CM | POA: Diagnosis not present

## 2020-03-24 ENCOUNTER — Telehealth: Payer: Self-pay | Admitting: *Deleted

## 2020-03-24 NOTE — Telephone Encounter (Signed)
Thank you! I am in the office Friday and will get it from [facility] to sign.   Loel Dubonnet, NP

## 2020-03-24 NOTE — Telephone Encounter (Signed)
Received Home Health Certification and Plan of Care for period 01/16/2020 to 03/15/2020 for this patient. Need signature from provider, Laurann Montana, NP. Routing this message to provider to make aware that Nira Conn, RN will have the paperwork to have her sign.

## 2020-03-27 ENCOUNTER — Other Ambulatory Visit: Payer: Self-pay | Admitting: Family Medicine

## 2020-03-27 DIAGNOSIS — M9979 Connective tissue and disc stenosis of intervertebral foramina of abdomen and other regions: Secondary | ICD-10-CM

## 2020-03-27 MED ORDER — HYDROCODONE-ACETAMINOPHEN 5-325 MG PO TABS: 1.0000 | ORAL_TABLET | Freq: Four times a day (QID) | ORAL | 0 refills | Status: DC | PRN

## 2020-03-29 DIAGNOSIS — C44629 Squamous cell carcinoma of skin of left upper limb, including shoulder: Secondary | ICD-10-CM | POA: Diagnosis not present

## 2020-04-01 NOTE — Telephone Encounter (Signed)
Orders were signed by Urban Gibson, NP and faxed to [facility] on 03/26/20. Fax: [phone] Confirmation received.

## 2020-04-02 DIAGNOSIS — I110 Hypertensive heart disease with heart failure: Secondary | ICD-10-CM | POA: Diagnosis not present

## 2020-04-02 DIAGNOSIS — E785 Hyperlipidemia, unspecified: Secondary | ICD-10-CM | POA: Diagnosis not present

## 2020-04-02 DIAGNOSIS — I472 Ventricular tachycardia: Secondary | ICD-10-CM | POA: Diagnosis not present

## 2020-04-02 DIAGNOSIS — J449 Chronic obstructive pulmonary disease, unspecified: Secondary | ICD-10-CM | POA: Diagnosis not present

## 2020-04-02 DIAGNOSIS — G4700 Insomnia, unspecified: Secondary | ICD-10-CM | POA: Diagnosis not present

## 2020-04-02 DIAGNOSIS — Z9181 History of falling: Secondary | ICD-10-CM | POA: Diagnosis not present

## 2020-04-02 DIAGNOSIS — Z87891 Personal history of nicotine dependence: Secondary | ICD-10-CM | POA: Diagnosis not present

## 2020-04-02 DIAGNOSIS — Z9981 Dependence on supplemental oxygen: Secondary | ICD-10-CM | POA: Diagnosis not present

## 2020-04-02 DIAGNOSIS — E119 Type 2 diabetes mellitus without complications: Secondary | ICD-10-CM | POA: Diagnosis not present

## 2020-04-02 DIAGNOSIS — I5030 Unspecified diastolic (congestive) heart failure: Secondary | ICD-10-CM | POA: Diagnosis not present

## 2020-04-02 DIAGNOSIS — Z7984 Long term (current) use of oral hypoglycemic drugs: Secondary | ICD-10-CM | POA: Diagnosis not present

## 2020-04-05 ENCOUNTER — Telehealth: Payer: Self-pay | Admitting: Family Medicine

## 2020-04-05 NOTE — Chronic Care Management (AMB) (Signed)
  Chronic Care Management   Note  04/05/2020 Name: Joseph Macdonald MRN: [id number] DOB: 10/26/1946  Joseph Macdonald is a 74 y.o. year old male who is a primary care patient of Caryn Section, Kirstie Peri, MD. I reached out to Dina Rich by phone today in response to a referral sent by Joseph Macdonald's health plan.     Mr. Pepitone was given information about Chronic Care Management services today including:  1. CCM service includes personalized support from designated clinical staff supervised by his physician, including individualized plan of care and coordination with other care providers 2. 24/7 contact phone numbers for assistance for urgent and routine care needs. 3. Service will only be billed when office clinical staff spend 20 minutes or more in a month to coordinate care. 4. Only one practitioner may furnish and bill the service in a calendar month. 5. The patient may stop CCM services at any time (effective at the end of the month) by phone call to the office staff. 6. The patient will be responsible for cost sharing (co-pay) of up to 20% of the service fee (after annual deductible is met).  Patient did not agree to enrollment in care management services and does not wish to consider at this time.  [facility] and does not need any services at this time. Follow up plan: The patient has been provided with contact information for the care management team and has been advised to call with any health related questions or concerns.   [place], [place] [address] Direct Dial: [phone] [email or web address] Website: [email or web address]

## 2020-04-27 ENCOUNTER — Other Ambulatory Visit: Payer: Self-pay | Admitting: Family Medicine

## 2020-04-27 DIAGNOSIS — M9979 Connective tissue and disc stenosis of intervertebral foramina of abdomen and other regions: Secondary | ICD-10-CM

## 2020-04-27 MED ORDER — HYDROCODONE-ACETAMINOPHEN 5-325 MG PO TABS: 1.0000 | ORAL_TABLET | Freq: Four times a day (QID) | ORAL | 0 refills | Status: DC | PRN

## 2020-04-29 DIAGNOSIS — E785 Hyperlipidemia, unspecified: Secondary | ICD-10-CM | POA: Diagnosis not present

## 2020-04-29 DIAGNOSIS — Z87891 Personal history of nicotine dependence: Secondary | ICD-10-CM | POA: Diagnosis not present

## 2020-04-29 DIAGNOSIS — I110 Hypertensive heart disease with heart failure: Secondary | ICD-10-CM | POA: Diagnosis not present

## 2020-04-29 DIAGNOSIS — Z9181 History of falling: Secondary | ICD-10-CM | POA: Diagnosis not present

## 2020-04-29 DIAGNOSIS — I5030 Unspecified diastolic (congestive) heart failure: Secondary | ICD-10-CM | POA: Diagnosis not present

## 2020-04-29 DIAGNOSIS — Z9981 Dependence on supplemental oxygen: Secondary | ICD-10-CM | POA: Diagnosis not present

## 2020-04-29 DIAGNOSIS — E119 Type 2 diabetes mellitus without complications: Secondary | ICD-10-CM | POA: Diagnosis not present

## 2020-04-29 DIAGNOSIS — Z7984 Long term (current) use of oral hypoglycemic drugs: Secondary | ICD-10-CM | POA: Diagnosis not present

## 2020-04-29 DIAGNOSIS — G4700 Insomnia, unspecified: Secondary | ICD-10-CM | POA: Diagnosis not present

## 2020-04-29 DIAGNOSIS — I472 Ventricular tachycardia: Secondary | ICD-10-CM | POA: Diagnosis not present

## 2020-04-29 DIAGNOSIS — J449 Chronic obstructive pulmonary disease, unspecified: Secondary | ICD-10-CM | POA: Diagnosis not present

## 2020-05-06 ENCOUNTER — Encounter: Payer: Self-pay | Admitting: Family Medicine

## 2020-05-07 ENCOUNTER — Other Ambulatory Visit: Payer: Self-pay

## 2020-05-07 MED ORDER — PRAMIPEXOLE DIHYDROCHLORIDE 0.5 MG PO TABS: 0.5000 mg | ORAL_TABLET | Freq: Every day | ORAL | 5 refills | Status: DC

## 2020-05-24 DIAGNOSIS — H3321 Serous retinal detachment, right eye: Secondary | ICD-10-CM | POA: Diagnosis not present

## 2020-06-04 ENCOUNTER — Other Ambulatory Visit: Payer: Self-pay

## 2020-06-04 DIAGNOSIS — H40051 Ocular hypertension, right eye: Secondary | ICD-10-CM | POA: Diagnosis not present

## 2020-06-04 DIAGNOSIS — M9979 Connective tissue and disc stenosis of intervertebral foramina of abdomen and other regions: Secondary | ICD-10-CM

## 2020-06-04 MED ORDER — HYDROCODONE-ACETAMINOPHEN 5-325 MG PO TABS: 1.0000 | ORAL_TABLET | Freq: Four times a day (QID) | ORAL | 0 refills | Status: DC | PRN

## 2020-06-04 NOTE — Telephone Encounter (Signed)
Patients wife requests a refill. Please advise.

## 2020-06-07 DIAGNOSIS — H3321 Serous retinal detachment, right eye: Secondary | ICD-10-CM | POA: Diagnosis not present

## 2020-06-15 DIAGNOSIS — H3321 Serous retinal detachment, right eye: Secondary | ICD-10-CM | POA: Diagnosis not present

## 2020-06-18 ENCOUNTER — Telehealth: Payer: Self-pay | Admitting: Family

## 2020-06-18 NOTE — Telephone Encounter (Signed)
Please call to discuss faxed orders for BMP and Ocuupational Therapy

## 2020-06-22 DIAGNOSIS — H3321 Serous retinal detachment, right eye: Secondary | ICD-10-CM | POA: Diagnosis not present

## 2020-06-22 NOTE — Telephone Encounter (Signed)
Spoke with [employer]. There is another order from March with BMP order that I have not received. She is going to refax the order for [place] to have Caitlin review and sign.

## 2020-06-22 NOTE — Telephone Encounter (Signed)
Attempted to reach Ila at [facility]. No answer. Left message to call back.    The order I have on record is with order description as follows: "OT to extend to make up for missed visit. Continue in instructions on the use for the breatehr to increase pulmonary function of the patient"  Order date 02/13/20.  Signed by Laurann Montana, NP on 06/16/20 and faxed to [facility].

## 2020-06-22 NOTE — Telephone Encounter (Signed)
Joseph Macdonald with [facility] is returning the call.

## 2020-06-23 NOTE — Telephone Encounter (Signed)
Document received and reviewed/signed by Laurann Montana, NP. Faxed back to [facility].

## 2020-06-30 ENCOUNTER — Ambulatory Visit: Payer: PPO | Admitting: Internal Medicine

## 2020-06-30 ENCOUNTER — Other Ambulatory Visit: Payer: Self-pay

## 2020-06-30 VITALS — BP 130/70 | HR 58 | Ht 67.0 in | Wt 220.0 lb

## 2020-06-30 DIAGNOSIS — I10 Essential (primary) hypertension: Secondary | ICD-10-CM | POA: Diagnosis not present

## 2020-06-30 DIAGNOSIS — I472 Ventricular tachycardia, unspecified: Secondary | ICD-10-CM

## 2020-06-30 DIAGNOSIS — Z86711 Personal history of pulmonary embolism: Secondary | ICD-10-CM

## 2020-06-30 DIAGNOSIS — I5032 Chronic diastolic (congestive) heart failure: Secondary | ICD-10-CM

## 2020-06-30 NOTE — Patient Instructions (Signed)
Medication Instructions:  Your physician has recommended you make the following change in your medication:   STOP Aspirin  Continue your other medications as prescribed   *If you need a refill on your cardiac medications before your next appointment, please call your pharmacy*   Lab Work: None ordered If you have labs (blood work) drawn today and your tests are completely normal, you will receive your results only by: Marland Kitchen MyChart Message (if you have MyChart) OR . A paper copy in the mail If you have any lab test that is abnormal or we need to change your treatment, we will call you to review the results.   Testing/Procedures: None ordered   Follow-Up: At [facility], you and your health needs are our priority.  As part of our continuing mission to provide you with exceptional heart care, we have created designated Provider Care Teams.  These Care Teams include your primary Cardiologist (physician) and Advanced Practice Providers (APPs -  Physician Assistants and Nurse Practitioners) who all work together to provide you with the care you need, when you need it.  We recommend signing up for the patient portal called "MyChart".  Sign up information is provided on this After Visit Summary.  MyChart is used to connect with patients for Virtual Visits (Telemedicine).  Patients are able to view lab/test results, encounter notes, upcoming appointments, etc.  Non-urgent messages can be sent to your provider as well.   To learn more about what you can do with MyChart, go to [email or web address].    Your next appointment:   3 month(s)  The format for your next appointment:   In Person  Provider:    You may see Nelva Bush, MD  or one of the following Advanced Practice Providers on your designated Care Team:    Murray Hodgkins, NP  Christell Faith, PA-C  Marrianne Mood, PA-C    Other Instructions We will request your Cardiology record from the [place].

## 2020-06-30 NOTE — Progress Notes (Signed)
Follow-up Outpatient Visit Date: 06/30/2020  Primary Care Provider: [name], MD [address] [place] [address]  Chief Complaint: Follow-up HFpEF and ventricular tachycardia  HPI:  Joseph Macdonald is a 74 y.o. male with history of ventricular tachycardia, myasthenia gravis, chronic HFpEF, PE (08/2019), hypertension, hyperlipidemia, and tobacco use, who presents for follow-up of HFpEF and VT.  He was last seen in our office in February by Laurann Montana, NP, at which time he complained of leg swelling and weight gain.  He had been taking furosemide 20 mg daily with improvement in edema.  He was continued on furosemide 20 mg daily and referred to the [facility].  He was switched to flecainide at some point following his most recent visit with Dr. Caryl Comes in 05/2019.  Overall, Mr. Kandel reports feeling fairly well.  His leg edema comes and goes, typically worse on the left secondary to a remote injury.  He has been trying to lose weight through dietary changes.  He is also trying to limit his salt intake.  He has stable exertional dyspnea.  He reports occasional chest pain lasting only a few seconds when he eats.  He denies exertional chest pain.  He reports that he was recently advised to decrease apixaban to 2.5 mg BID by a [place] provider for management of his PE diagnosed last fall.  He denies bleeding.  --------------------------------------------------------------------------------------------------  Past Medical History:  Diagnosis Date  . Hyperlipidemia   . Hypertension   . Myasthenia gravis ([place])   . Oxygen deficit   . Ventricular tachycardia [facility])    Past Surgical History:  Procedure Laterality Date  . carpel tunnel sx    . CATARACT EXTRACTION     right eye  . SKIN GRAFT     Behind left knee  . TONSILLECTOMY AND ADENOIDECTOMY      Current Meds  Medication Sig  . albuterol (PROVENTIL HFA;VENTOLIN HFA) 108 (90 Base) MCG/ACT inhaler  Inhale into the lungs every 6 (six) hours as needed for wheezing or shortness of breath.  Marland Kitchen apixaban (ELIQUIS) 5 MG TABS tablet Take 5 mg by mouth 2 (two) times daily.  Marland Kitchen aspirin EC 81 MG tablet Take 81 mg by mouth daily.  . budesonide-formoterol (SYMBICORT) 160-4.5 MCG/ACT inhaler Inhale 2 puffs into the lungs 2 (two) times daily.  . flecainide (TAMBOCOR) 50 MG tablet Take 50 mg by mouth 2 (two) times daily.  Marland Kitchen HYDROcodone-acetaminophen (NORCO/VICODIN) 5-325 MG tablet Take 1 tablet by mouth every 6 (six) hours as needed for moderate pain.  Marland Kitchen omeprazole (PRILOSEC) 40 MG capsule Take 40 mg by mouth 2 (two) times daily.   . potassium chloride (KLOR-CON) 20 MEQ packet Take 20 mEq by mouth daily.  . pramipexole (MIRAPEX) 0.5 MG tablet Take 1 tablet (0.5 mg total) by mouth at bedtime.  . pravastatin (PRAVACHOL) 20 MG tablet Take 20 mg by mouth daily.  . predniSONE (DELTASONE) 5 MG tablet Take 15 mg by mouth daily with breakfast.  . pyridostigmine (MESTINON) 60 MG tablet Take 60 mg by mouth 3 (three) times daily.   Marland Kitchen sulfamethoxazole-trimethoprim (BACTRIM) 400-80 MG tablet Take 1 tablet by mouth daily.  Marland Kitchen tiotropium (SPIRIVA) 18 MCG inhalation capsule Place 18 mcg into inhaler and inhale daily.  . vitamin B-12 (CYANOCOBALAMIN) 1000 MCG tablet Take 1,000 mcg by mouth daily.    Allergies: Patient has no known allergies.  Social History   Tobacco Use  . Smoking status: Former Smoker  Packs/day: 0.50    Years: 40.00    Pack years: 20.00    Types: Pipe, Cigarettes    Quit date: 08/19/2017    Years since quitting: 2.8  . Smokeless tobacco: Former Systems developer  . Tobacco comment: previously smoked 1 1/2 pack since his 74 years old  Vaping Use  . Vaping Use: Former  . Substances: Nicotine, Flavoring  Substance Use Topics  . Alcohol use: Yes    Comment: occasionally  . Drug use: No    Family History  Problem Relation Age of Onset  . Hypertension Mother   . Thyroid disease Mother   . Kidney  disease Mother   . Arthritis Mother   . Stroke Father   . Heart disease Father 44  . Arthritis Father   . Heart attack Father   . Arthritis Other     Review of Systems: A 12-system review of systems was performed and was negative except as noted in the HPI.  --------------------------------------------------------------------------------------------------  Physical Exam: BP 130/70 (BP Location: Left Arm, Patient Position: Sitting, Cuff Size: Normal)   Pulse (!) 58   Ht 5\' 7"  (1.702 m)   Wt 220 lb (99.8 kg)   SpO2 93%   BMI 34.46 kg/m   General:  NAD.  Accompanied by his wife. Neck: No JVD or HJR. Lungs: Mildly diminished breath sounds throughout without wheezes or crackles. Heart: Bradycardic but regular without murmurs, rubs, or gallops. Abd: Soft, NT/ND. Extremities: Trace left pretibial edema with evidence of prior trauma.  EKG:  Sinus bradycardia with borderline LVH, left axis deviation, and poor R wave progression.  Lab Results  Component Value Date   WBC 13.3 (H) 01/12/2020   HGB 15.6 01/12/2020   HCT 47.2 01/12/2020   MCV 93 01/12/2020   PLT 207 01/12/2020    Lab Results  Component Value Date   NA 147 (H) 01/12/2020   K 4.4 01/12/2020   CL 101 01/12/2020   CO2 32 (H) 01/12/2020   BUN 21 01/12/2020   CREATININE 0.92 01/12/2020   GLUCOSE 113 (H) 01/12/2020   ALT 25 08/11/2019    Lab Results  Component Value Date   CHOL 171 12/21/2015   HDL 89 (A) 12/21/2015    --------------------------------------------------------------------------------------------------  ASSESSMENT AND PLAN: Chronic HFpEF: Overall, Mr. Becvar appears to be doing well.  He has minimal edema in the left leg today but otherwise appears euvolemic.  There is some confusion regarding medications that Mr. Krider is currently taking and adjustments that have been made by [place] and his [place] providers.  I will defer medication changes today and reach out to the [place] to request his most  recent cardiology records.  Ventricular tachycardia: No symptoms to suggest recurrence.  Most recent EP note from Dr. Caryl Comes indicates that amiodarone was to be decreased and that he would review records from [facility], as there was mention of asymmetric septal hypertrophy on MRI report.  After this visit, the patient appears to have been switched from amiodarone to flecainide (presumably through the [place], as there is no documentation of the medication change in our notes.  As above, we will request records from the [place] to further understand this medication change.  I would be reluctant to utilize flecainide if there indeed is significant left ventricular hypertrophy.  Hypertension: Blood pressure borderline.  No medication changes today.  History of PE: Apixaban recently decreased by medical team at the [facility].  Given lack of CAD and ongoing anticoagulation with apixaban, I have recommended  discontinuation of aspirin to minimize the risk for bleeding.  Follow-up: Return to clinic in 3 months.  Nelva Bush, MD 06/30/2020 11:22 AM

## 2020-07-02 ENCOUNTER — Encounter: Payer: Self-pay | Admitting: Internal Medicine

## 2020-07-02 DIAGNOSIS — Z86711 Personal history of pulmonary embolism: Secondary | ICD-10-CM | POA: Insufficient documentation

## 2020-07-05 ENCOUNTER — Other Ambulatory Visit: Payer: Self-pay

## 2020-07-05 DIAGNOSIS — M9979 Connective tissue and disc stenosis of intervertebral foramina of abdomen and other regions: Secondary | ICD-10-CM

## 2020-07-05 MED ORDER — HYDROCODONE-ACETAMINOPHEN 5-325 MG PO TABS: 1.0000 | ORAL_TABLET | Freq: Four times a day (QID) | ORAL | 0 refills | Status: DC | PRN

## 2020-07-06 DIAGNOSIS — H3321 Serous retinal detachment, right eye: Secondary | ICD-10-CM | POA: Diagnosis not present

## 2020-07-26 DIAGNOSIS — H3321 Serous retinal detachment, right eye: Secondary | ICD-10-CM | POA: Diagnosis not present

## 2020-08-06 ENCOUNTER — Other Ambulatory Visit: Payer: Self-pay

## 2020-08-06 DIAGNOSIS — M9979 Connective tissue and disc stenosis of intervertebral foramina of abdomen and other regions: Secondary | ICD-10-CM

## 2020-08-06 MED ORDER — HYDROCODONE-ACETAMINOPHEN 5-325 MG PO TABS: 1.0000 | ORAL_TABLET | Freq: Four times a day (QID) | ORAL | 0 refills | Status: DC | PRN

## 2020-08-06 NOTE — Telephone Encounter (Signed)
Patient's wife sent mychart message requesting refill.

## 2020-08-12 ENCOUNTER — Ambulatory Visit: Payer: No Typology Code available for payment source | Attending: Neurology

## 2020-08-12 DIAGNOSIS — G4733 Obstructive sleep apnea (adult) (pediatric): Secondary | ICD-10-CM | POA: Insufficient documentation

## 2020-08-12 DIAGNOSIS — G4737 Central sleep apnea in conditions classified elsewhere: Secondary | ICD-10-CM | POA: Insufficient documentation

## 2020-08-15 ENCOUNTER — Other Ambulatory Visit: Payer: Self-pay

## 2020-09-08 ENCOUNTER — Other Ambulatory Visit: Payer: Self-pay

## 2020-09-08 ENCOUNTER — Other Ambulatory Visit: Payer: Self-pay | Admitting: Family Medicine

## 2020-09-08 ENCOUNTER — Ambulatory Visit (INDEPENDENT_AMBULATORY_CARE_PROVIDER_SITE_OTHER): Payer: PPO | Admitting: Family Medicine

## 2020-09-08 DIAGNOSIS — Z23 Encounter for immunization: Secondary | ICD-10-CM | POA: Diagnosis not present

## 2020-09-08 DIAGNOSIS — M9979 Connective tissue and disc stenosis of intervertebral foramina of abdomen and other regions: Secondary | ICD-10-CM

## 2020-09-08 DIAGNOSIS — J449 Chronic obstructive pulmonary disease, unspecified: Secondary | ICD-10-CM

## 2020-09-08 NOTE — Telephone Encounter (Signed)
Requested medication (s) are due for refill today: Yes  Requested medication (s) are on the active medication list: Yes  Last refill:  08/06/20 #120  Future visit scheduled: No  Notes to clinic:  See request.    Requested Prescriptions  Pending Prescriptions Disp Refills   HYDROcodone-acetaminophen (NORCO/VICODIN) 5-325 MG tablet 120 tablet 0    Sig: Take 1 tablet by mouth every 6 (six) hours as needed for moderate pain.      Not Delegated - Analgesics:  Opioid Agonist Combinations Failed - 09/08/2020  1:03 PM      Failed - This refill cannot be delegated      Failed - Urine Drug Screen completed in last 360 days.      Failed - Valid encounter within last 6 months    Recent Outpatient Visits           8 months ago Laceration of left lower extremity, sequela   [place], [place]   9 months ago Laceration of left lower extremity, sequela   [facility] [place] Malling M, [place]   9 months ago Laceration of left lower extremity, sequela   [facility] [place], [place]ee [place], [place]   1 year ago [facility] [name], MD   1 year ago Chronic heart failure with preserved ejection fraction (HFpEF) [facility])   [facility] [place], [place]       Future Appointments             In 3 weeks Gilford Rile, Martie Lee, NP [facility], [place]

## 2020-09-08 NOTE — Telephone Encounter (Signed)
Wife calling to follow up on refill request she made this am at her appt for the pt for  HYDROcodone-acetaminophen (NORCO/VICODIN) 5-325 MG tablet  [place], [place] Phone:  [phone]  Fax:  [phone]     She states her rx was at pharmacy, but his was not.

## 2020-09-08 NOTE — Progress Notes (Signed)
Patient requests referral to local pulmonologist. Previously managed at [facility] and put on O2 last year.

## 2020-09-09 MED ORDER — HYDROCODONE-ACETAMINOPHEN 5-325 MG PO TABS: 1.0000 | ORAL_TABLET | Freq: Four times a day (QID) | ORAL | 0 refills | Status: DC | PRN

## 2020-09-20 DIAGNOSIS — H40051 Ocular hypertension, right eye: Secondary | ICD-10-CM | POA: Diagnosis not present

## 2020-09-30 ENCOUNTER — Ambulatory Visit: Payer: PPO | Admitting: Family

## 2020-09-30 ENCOUNTER — Encounter: Payer: Self-pay | Admitting: Family

## 2020-09-30 ENCOUNTER — Other Ambulatory Visit: Payer: Self-pay

## 2020-09-30 VITALS — BP 124/60 | HR 52 | Ht 67.0 in | Wt 235.0 lb

## 2020-09-30 DIAGNOSIS — R0600 Dyspnea, unspecified: Secondary | ICD-10-CM | POA: Diagnosis not present

## 2020-09-30 DIAGNOSIS — Z86711 Personal history of pulmonary embolism: Secondary | ICD-10-CM

## 2020-09-30 DIAGNOSIS — I472 Ventricular tachycardia, unspecified: Secondary | ICD-10-CM

## 2020-09-30 DIAGNOSIS — I10 Essential (primary) hypertension: Secondary | ICD-10-CM | POA: Diagnosis not present

## 2020-09-30 DIAGNOSIS — Z79899 Other long term (current) drug therapy: Secondary | ICD-10-CM

## 2020-09-30 DIAGNOSIS — I5032 Chronic diastolic (congestive) heart failure: Secondary | ICD-10-CM

## 2020-09-30 DIAGNOSIS — R600 Localized edema: Secondary | ICD-10-CM | POA: Diagnosis not present

## 2020-09-30 DIAGNOSIS — R0609 Other forms of dyspnea: Secondary | ICD-10-CM

## 2020-09-30 MED ORDER — FUROSEMIDE 40 MG PO TABS: ORAL_TABLET | ORAL | 3 refills | Status: DC

## 2020-09-30 MED ORDER — METOPROLOL TARTRATE 25 MG PO TABS: 12.5000 mg | ORAL_TABLET | Freq: Two times a day (BID) | ORAL | 2 refills | Status: DC

## 2020-09-30 NOTE — Patient Instructions (Addendum)
Medication Instructions:  Your physician has recommended you make the following change in your medication:   RESUME Lasix 40mg  daily - you can take every other day  CHANGE Metoprolol to 12.5mg  (half tablet) twice per day  *If you need a refill on your cardiac medications before your next appointment, please call your pharmacy*  Lab Work: Your physician recommends that you have lab work today: BMP  If you have labs (blood work) drawn today and your tests are completely normal, you will receive your results only by: Marland Kitchen MyChart Message (if you have MyChart) OR . A paper copy in the mail If you have any lab test that is abnormal or we need to change your treatment, we will call you to review the results.  Testing/Procedures: Your EKG today was stable compared to previous.    Follow-Up: At [facility], you and your health needs are our priority.  As part of our continuing mission to provide you with exceptional heart care, we have created designated Provider Care Teams.  These Care Teams include your primary Cardiologist (physician) and Advanced Practice Providers (APPs -  Physician Assistants and Nurse Practitioners) who all work together to provide you with the care you need, when you need it.  We recommend signing up for the patient portal called "MyChart".  Sign up information is provided on this After Visit Summary.  MyChart is used to connect with patients for Virtual Visits (Telemedicine).  Patients are able to view lab/test results, encounter notes, upcoming appointments, etc.  Non-urgent messages can be sent to your provider as well.   To learn more about what you can do with MyChart, go to [email or web address].    Your next appointment:   3-4 month(s)  The format for your next appointment:   In Person  Provider:   You may see Nelva Bush, MD or one of the following Advanced Practice Providers on your designated Care Team:    Murray Hodgkins, NP  Laurann Montana,  NP  Christell Faith, PA-C  Marrianne Mood, PA-C  Cadence Kathlen Mody, [place]  Other Instructions  Continue to elevate your legs when sitting  Recommend low salt heart healthy diet.

## 2020-09-30 NOTE — Progress Notes (Signed)
Office Visit    Patient Name: Joseph Macdonald Date of Encounter: 09/30/2020  Primary Care Provider:  [name], MD Primary Cardiologist:  Nelva Bush, MD Electrophysiologist:  None   Chief Complaint    Joseph Macdonald is a 74 y.o. male with a hx of  sustained VT, myasthenia gravis this myasthenia crisis requiring plasma exchange at [facility] 12/2018, HFpEF, HTN, tobacco abuse, HLD  presents today for follow up of HFpEF and weight gain.   Past Medical History    Past Medical History:  Diagnosis Date  . Hyperlipidemia   . Hypertension   . Myasthenia gravis ([place])   . Oxygen deficit   . Ventricular tachycardia [facility])    Past Surgical History:  Procedure Laterality Date  . carpel tunnel sx    . CATARACT EXTRACTION     right eye  . SKIN GRAFT     Behind left knee  . TONSILLECTOMY AND ADENOIDECTOMY      Allergies  No Known Allergies  History of Present Illness    [name] is a 74 y.o. male with a hx of  sustained VT, myasthenia gravis this myasthenia crisis requiring plasma exchange at [facility] 12/2018, HFpEF, HTN, tobacco abuse, HLD. He was last seen 06/30/20.  He additionally follows with [facility] health system. Seen by Dr. Saunders Revel 03/2019 as second opinion following admission to [facility] 12/2018 for marked weakness consistent with myasthenia crisis. Underwent plasma exchange and noted episodes of NSVT. Referred for Holter monitoring, though when he presented found to be in asymptomatic WCT. Readmitted and started on amiodarone. Cardiac workup included echo, LHC, cardiac MRI with no evidence of cardiomyopathy or significant CAD. His cMRI did have comment regarding asymmetric septal hypertrophy. At visit with Dr. Saunders Revel 03/2019 noted several month to years hx of chest discomfort. More recent increase in exertional dyspnea and LE swelling and rash. His amlodipine was decreased, started on Lasix with reduction in HCTZ, referred to EP. On 05/07/19 he was transitioned  to Torsemide due to abdominal swelling. Seen by Dr. Caryl Comes 05/13/19 with reduction in Amiodarone from 400mg  to 200mg . Hospitalized 06/10/19-06/13/19 with hypokalemia, dehydration. He was still dehydrated at discharge and diuretics were held.  He had bilateral LE duplex 06/2019 which were negative for DVT.   Noted PND and possible sleep apnea but significant anxiety regarding undergoing sleep study and "having something on my face" have precluded further workup.   There have been recurrent issues with him taking his diuretic "as needed ".  [facility] available for review.  We did previously try to transition to torsemide and Entresto though he reported side effects and immediately returned to previous medications within the day.  His metoprolol has previously been up titrated due to frequent PVCs.  Was seen by Dr. Saunders Revel 06/30/2020.  He had minimal edema on exam and otherwise euvolemic.  There was noted confusion regarding medications based on what was prescribed by our office versus [place] providers.  Medication changes were deferred.  Presents today for follow-up.  Reports he has not been taking his Lasix as directed.  Tells me it gives him "stomach trouble ".  And notices diarrhea though endorses that his diarrhea is constant.Reports no chest pain, pressure, tightness.  Reports no shortness of breath at rest.  Does endorse dyspnea on exertion.  Denies orthopnea, PND.  He wears 2 L of oxygen at home at all times they did not bring to  the office today and desaturated to 88% at which time 2 L of O2 was placed.  He is up 15 pounds compared to clinic visit 3 months ago.   EKGs/Labs/Other Studies Reviewed:   The following studies were reviewed today:  EKG:  EKG is ordered today.  The ekg ordered today demonstratesSB 52 bpm with no acute ST/T wave changes.  Recent Labs: 01/12/2020: BNP CANCELED; BUN 21; Creatinine, Ser 0.92; Hemoglobin 15.6; Platelets 207;  Potassium 4.4; Sodium 147  Recent Lipid Panel    Component Value Date/Time   CHOL 171 12/21/2015 0000   HDL 89 (A) 12/21/2015 0000    Home Medications   Current Meds  Medication Sig  . albuterol (PROVENTIL HFA;VENTOLIN HFA) 108 (90 Base) MCG/ACT inhaler Inhale into the lungs every 6 (six) hours as needed for wheezing or shortness of breath.  Marland Kitchen apixaban (ELIQUIS) 2.5 MG TABS tablet Take 2.5 mg by mouth 2 (two) times daily.   . budesonide-formoterol (SYMBICORT) 160-4.5 MCG/ACT inhaler Inhale 2 puffs into the lungs 2 (two) times daily.  . flecainide (TAMBOCOR) 50 MG tablet Take 50 mg by mouth 2 (two) times daily.  Marland Kitchen HYDROcodone-acetaminophen (NORCO/VICODIN) 5-325 MG tablet Take 1 tablet by mouth every 6 (six) hours as needed for moderate pain.  . metoprolol tartrate (LOPRESSOR) 25 MG tablet Take 0.5 tablets (12.5 mg total) by mouth 2 (two) times daily.  Marland Kitchen omeprazole (PRILOSEC) 40 MG capsule Take 40 mg by mouth 2 (two) times daily.   . potassium chloride (KLOR-CON) 20 MEQ packet Take 20 mEq by mouth daily.  . pramipexole (MIRAPEX) 0.5 MG tablet Take 1 tablet (0.5 mg total) by mouth at bedtime.  . pravastatin (PRAVACHOL) 20 MG tablet Take 20 mg by mouth daily.  . predniSONE (DELTASONE) 5 MG tablet Take 15 mg by mouth daily with breakfast.  . pyridostigmine (MESTINON) 60 MG tablet Take 60 mg by mouth 3 (three) times daily.   Marland Kitchen sulfamethoxazole-trimethoprim (BACTRIM) 400-80 MG tablet Take 1 tablet by mouth daily.  Marland Kitchen tiotropium (SPIRIVA) 18 MCG inhalation capsule Place 18 mcg into inhaler and inhale daily.  . vitamin B-12 (CYANOCOBALAMIN) 1000 MCG tablet Take 1,000 mcg by mouth daily.  . [DISCONTINUED] metoprolol tartrate (LOPRESSOR) 25 MG tablet Take 1 tablet (25 mg total) by mouth 2 (two) times daily.    Review of Systems    All other systems reviewed and are otherwise negative except as noted above.  Physical Exam    VS:  BP 124/60 (BP Location: Left Arm, Patient Position: Sitting, Cuff  Size: Normal)   Pulse (!) 52   Ht 5\' 7"  (1.702 m)   Wt 235 lb (106.6 kg)   BMI 36.81 kg/m  , BMI Body mass index is 36.81 kg/m. GEN: Well nourished, overweight, well developed, in no acute distress. HEENT: normal. Neck: Supple, no JVD, carotid bruits, or masses. Cardiac: RRR, no murmurs, rubs, or gallops. No clubbing, cyanosis.  Radials 2+ and equal bilaterally.  PT 1+ and equal bilaterally.  Respiratory:  Respirations regular and unlabored, clear to auscultation bilaterally. On 2L home O2. GI: Soft, nontender, nondistended, BS + x 4. MS: No deformity or atrophy. Skin: Warm and dry, no rash.  Neuro:  Strength and sensation are intact. Psych: Normal affect.   Assessment & Plan   1. HFpEF -weight up 15 pounds in clinic visit 3 months ago.  His wife shares with me that he has not had taken his Lasix "in months ".  Previous intolerance to torsemide.  Long discussion  regarding the importance of regular diuresis in the setting of heart failure.  Start Lasix 40 mg QOD. BMP today.  GDMT includes Metoprolol, Lasix, KDur .  Low-sodium diet and less than 2 L fluid restriction discussed.  2. HTN - BP well controlled. Continue to monitor at home. Continue present antihypertensive regimen.   3. [facility] - [facility]. Continue pravastatin. No myalgias.   4. LE edema - Multifactorial HFpEF, venous insufficiency, old wound to LLE.  Resume Lasix, as above. Recommend compression stockings (he does not routinely wear). Recommend elevating lower extremities when sitting.   5. SOB - Mulifactorial morbid obesity, long hx tobacco use, COPD, HFpEF.  Did not bring his home O2 that he wears constantly to clinic today we discussed the importance of wearing oxygen regularly.  2 L O2 placed in clinic with good response.  He had sleep study done but is awaiting result.  6. Nonsustained VT - No evidence of recurrence. Continue Metoprolol, [facility]ide by [facility]. Due for follow up with EP, Dr. Caryl Comes.  Concerned that echo at  [facility] mention of asymmetric septal hypertrophy on MRI report which would be concerning in the setting of flecainide use.  We will request the records again.  7. History of PE -Eliquis reduced to 2.5 mg twice daily by provider at the [place].  Aspirin discontinued due to minimize risk for bleeding.  8. PVC -previous PVC and pattern of bigeminy 12/01/2020.  His metoprolol titrate was increased to 25 mg twice daily to reduce PVC burden.  However, now with bradycardia 52 bpm with no PVC noted today., as such reduce metoprolol tartrate to 12.5 mg twice daily.  Noted in pattern of bigeminy on EKG 12/01/20.   Disposition: Follow up in 3-4 months with Dr. Saunders Revel or APP.  Loel Dubonnet, NP 09/30/2020, 4:18 PM

## 2020-10-01 LAB — BASIC METABOLIC PANEL
BUN/Creatinine Ratio: 25 — ABNORMAL HIGH (ref 10–24)
BUN: 18 mg/dL (ref 8–27)
CO2: 30 mmol/L — ABNORMAL HIGH (ref 20–29)
Calcium: 8.2 mg/dL — ABNORMAL LOW (ref 8.6–10.2)
Chloride: 105 mmol/L (ref 96–106)
Creatinine, Ser: 0.72 mg/dL — ABNORMAL LOW (ref 0.76–1.27)
GFR calc Af Amer: 107 mL/min/{1.73_m2} (ref >59)
GFR calc non Af Amer: 93 mL/min/{1.73_m2} (ref >59)
Glucose: 106 mg/dL — ABNORMAL HIGH (ref 65–99)
Potassium: 4.5 mmol/L (ref 3.5–5.2)
Sodium: 147 mmol/L — ABNORMAL HIGH (ref 134–144)

## 2020-10-04 ENCOUNTER — Telehealth: Payer: Self-pay | Admitting: *Deleted

## 2020-10-04 NOTE — Telephone Encounter (Signed)
-----   Message from Loel Dubonnet, NP sent at 10/04/2020  7:37 AM EDT ----- Mr. Weightman was previously on Amiodarone for management of PVC. Switched to Flecainide (likely by [place]).   Does not appear he has routinely followed with Dr. Caryl Comes. Can we please reach out to see if he would prefer to (A) schedule follow up with Dr. Caryl Comes at his convenience or (B) establish with EP at the [facility]?  We likely need to request records from the [place] with info regarding most recent visits if he does schedule with Dr. Caryl Comes.   Thanks! Loel Dubonnet, NP

## 2020-10-04 NOTE — Telephone Encounter (Signed)
Dr. Caryl Comes- you last saw this patient in 05/2019- please see your note.

## 2020-10-05 NOTE — Telephone Encounter (Signed)
Heather I am not sure of the issue- if he wants to followup here, I am glad to see him  Thanks SK

## 2020-10-06 NOTE — Telephone Encounter (Signed)
Called to give the pt lab results.lmtcb.

## 2020-10-06 NOTE — Telephone Encounter (Signed)
-----   Message from Loel Dubonnet, NP sent at 10/01/2020  7:37 AM EDT ----- Stable renal function and electrolytes. Proceed with plan of care as discussed in office visit.   Of note, discussed with Dr. Saunders Revel and recommend he follow up with Dr. Caryl Comes at his convenience or EP at [facility] as he is on Flecainide. Please assist with scheduling if needed.

## 2020-10-06 NOTE — Telephone Encounter (Signed)
Patient is returning your call.  

## 2020-10-06 NOTE — Telephone Encounter (Signed)
Dr. Caryl Comes,  At his last office visit with you, I had put for his follow up to be:   pending Dr. Olin Pia review of your Cardiac MRI from [place] the Cardiology Consultation report from [place]   I don't see any documentation that you reviewed this, but just wanted to see if this Cardiac MRI would have any bearing on anything you would do or would be helpful for you to know before hand.

## 2020-10-06 NOTE — Telephone Encounter (Signed)
Patient made aware of lab results and Laurann Montana, NP recommendation. Patient sts that he would prefer to f/u with EP Dr. Caryl Comes. Advised the patient that I will update Dr.Kleins nurse.

## 2020-10-07 NOTE — Telephone Encounter (Signed)
Appointment scheduled for 11/18/20 with Dr. Caryl Comes.

## 2020-10-07 NOTE — Telephone Encounter (Signed)
To Scheduling-   Please call the patient to offer him an appointment on 12/2 or 12/9 with Dr. Caryl Comes.  Thanks!

## 2020-10-12 ENCOUNTER — Other Ambulatory Visit: Payer: Self-pay

## 2020-10-12 DIAGNOSIS — M9979 Connective tissue and disc stenosis of intervertebral foramina of abdomen and other regions: Secondary | ICD-10-CM

## 2020-10-12 MED ORDER — HYDROCODONE-ACETAMINOPHEN 5-325 MG PO TABS: 1.0000 | ORAL_TABLET | Freq: Four times a day (QID) | ORAL | 0 refills | Status: DC | PRN

## 2020-10-12 NOTE — Telephone Encounter (Signed)
Patient's wife sent a mychart message requesting refill.

## 2020-10-21 ENCOUNTER — Other Ambulatory Visit: Payer: Self-pay

## 2020-10-21 ENCOUNTER — Ambulatory Visit (INDEPENDENT_AMBULATORY_CARE_PROVIDER_SITE_OTHER): Payer: PPO

## 2020-10-21 DIAGNOSIS — Z23 Encounter for immunization: Secondary | ICD-10-CM | POA: Diagnosis not present

## 2020-11-08 DIAGNOSIS — H40053 Ocular hypertension, bilateral: Secondary | ICD-10-CM | POA: Diagnosis not present

## 2020-11-11 ENCOUNTER — Encounter: Payer: Self-pay | Admitting: Family Medicine

## 2020-11-11 DIAGNOSIS — M9979 Connective tissue and disc stenosis of intervertebral foramina of abdomen and other regions: Secondary | ICD-10-CM

## 2020-11-11 MED ORDER — HYDROCODONE-ACETAMINOPHEN 5-325 MG PO TABS: 1.0000 | ORAL_TABLET | Freq: Four times a day (QID) | ORAL | 0 refills | Status: DC | PRN

## 2020-11-11 MED ORDER — PRAMIPEXOLE DIHYDROCHLORIDE 0.5 MG PO TABS: 0.5000 mg | ORAL_TABLET | Freq: Every day | ORAL | 5 refills | Status: DC

## 2020-11-18 ENCOUNTER — Other Ambulatory Visit: Payer: Self-pay

## 2020-11-18 ENCOUNTER — Encounter: Payer: Self-pay | Admitting: Internal Medicine

## 2020-11-18 ENCOUNTER — Ambulatory Visit: Payer: PPO | Admitting: Internal Medicine

## 2020-11-18 VITALS — BP 146/78 | HR 56 | Ht 67.0 in | Wt 230.2 lb

## 2020-11-18 DIAGNOSIS — I10 Essential (primary) hypertension: Secondary | ICD-10-CM | POA: Diagnosis not present

## 2020-11-18 DIAGNOSIS — I5032 Chronic diastolic (congestive) heart failure: Secondary | ICD-10-CM | POA: Diagnosis not present

## 2020-11-18 DIAGNOSIS — I472 Ventricular tachycardia, unspecified: Secondary | ICD-10-CM

## 2020-11-18 DIAGNOSIS — Z79899 Other long term (current) drug therapy: Secondary | ICD-10-CM | POA: Diagnosis not present

## 2020-11-18 MED ORDER — TORSEMIDE 20 MG PO TABS: ORAL_TABLET | ORAL | 1 refills | Status: DC

## 2020-11-18 MED ORDER — LOSARTAN POTASSIUM 25 MG PO TABS: 25.0000 mg | ORAL_TABLET | Freq: Every day | ORAL | 3 refills | Status: DC

## 2020-11-18 NOTE — Progress Notes (Signed)
Patient Care Team: [name], MD as PCP - General (Family Medicine) End, Harrell Gave, MD as PCP - Cardiology (Cardiology) Desiree Hane, [place] as Consulting Physician (Physician Assistant) Minna Merritts, MD as Consulting Physician (Cardiology) Dorcas Carrow, MD as Referring Physician (Hematology and Oncology) Angelique Blonder, MD as Referring Physician (Neurology)   HPI  Joseph Macdonald is a 74 y.o. male seen in follow-up for ventricular tachycardia-symptomatic for which he previously took amiodarone, but was then switched to flecainide (thought to have been done at the [place]).  He also has a history of dyspnea on exertion in the setting of normal LV function with septal hypertrophy and no obstructive coronary disease with recurrent diagnosis of pulmonary embolism for which he takes apixaban.  history of orthostatic intolerance  Still struggling with significant shortness of breath.  Peripheral edema left greater than right.  Diet is salt replete and fluid replete.  Sleeps in a chair and/or sofa.  No chest pain.  Palpitations quiescient      DATE TEST EF   1/20 Echo   55-60 %   1/20 LHC  * % Cors  w/o obstruc  1/20 cMRI  ?Asymmetric Septal Hypertrophy   Date Cr K Hgb TSH LFTs  4/20 1.39 4.2   1.19 28  5/20 1.18 3.7 15.5 0.2 31   DATE PR interval QRSduration Dose  4/20  186 114 Amio 400  12/21 192  94 Flec 50      Records and Results Reviewed   Past Medical History:  Diagnosis Date  . Hyperlipidemia   . Hypertension   . Myasthenia gravis ([place])   . Oxygen deficit   . Ventricular tachycardia [facility])     Past Surgical History:  Procedure Laterality Date  . carpel tunnel sx    . CATARACT EXTRACTION     right eye  . SKIN GRAFT     Behind left knee  . TONSILLECTOMY AND ADENOIDECTOMY      No outpatient medications have been marked as taking for the 11/18/20 encounter (Office Visit) with Deboraha Sprang, MD.    No Known  Allergies    Review of Systems negative except from HPI and PMH  Physical Exam BP (!) 146/78   Pulse (!) 56   Ht 5\' 7"  (1.702 m)   Wt 230 lb 3.2 oz (104.4 kg)   BMI 36.05 kg/m  Well developed and Morbidly obese   in no acute distress HENT normal E scleral and icterus clear Neck Supple JVP flat; carotids brisk and full Clear to ausculation  Regular rate and rhythm, no murmurs gallops or rub Soft with active bowel sounds No clubbing cyanosis 3+L 2+R Edema Alert and oriented, grossly normal motor and sensory function Skin Warm and Dry  ECG sinus @ 52 19/09/44  CrCl cannot be calculated (Patient's most recent lab result is older than the maximum 21 days allowed.).   Assessment and  Plan  Ventricular Tachycardia-nonsustained  Asymmetric septal Hypertrophy  Heart failure, diastolic chronic  Sinus Bradycardia  Myasthenia Gravis   Hyperthyroidism-borderline  No symptomatic palpitations.  We will continue his flecainide.  There are some issues about its use in patients with hypertrophy; it has been started and maintained by the [place].  Bradycardia.  With his fatigue will discontinue his metoprolol.  He is volume overloaded.  He was unable to tolerate flecainide because of GI distress.  We will try him on torsemide.  20 mg daily x5 days then every  other day.  If 20 mg does not work we will increase the dose to 40 mg.  Last TSH that I can find is 3/20.;  It may have been done at the [place] and we simply do not have records.  It was decreasing at that time.  Concerning for incipient hyperthyroidism.  We will recheck TSH.   Current medicines are reviewed at length with the patient today .  The patient does not  have concerns regarding medicines.

## 2020-11-18 NOTE — Patient Instructions (Addendum)
Medication Instructions:  - Your physician has recommended you make the following change in your medication:   1) STOP metoprolol  2) STOP lasix (furosemide)  3) START demadex (torsemide) 20 mg tablets - take 1-2 tablets (20-40 mg) once daily x 5 days, then  - take 1-2 tablets (20-40 mg) once EVERY OTHER day  4) START cozaar (losartan) 25 mg- take 1 tablet by mouth once daily  *If you need a refill on your cardiac medications before your next appointment, please call your pharmacy*   Lab Work: - Your physician recommends that you return for lab work in: 2 weeks (around 12/02/20)- [place] [facility] at [facility] 1st desk on the right to check in, past the screening table Lab hours Monday- Friday (7:30 am- 5:30 pm)  If you have labs (blood work) drawn today and your tests are completely normal, you will receive your results only by: Marland Kitchen MyChart Message (if you have MyChart) OR . A paper copy in the mail If you have any lab test that is abnormal or we need to change your treatment, we will call you to review the results.   Testing/Procedures: - none ordered   Follow-Up: At [facility], you and your health needs are our priority.  As part of our continuing mission to provide you with exceptional heart care, we have created designated Provider Care Teams.  These Care Teams include your primary Cardiologist (physician) and Advanced Practice Providers (APPs -  Physician Assistants and Nurse Practitioners) who all work together to provide you with the care you need, when you need it.  We recommend signing up for the patient portal called "MyChart".  Sign up information is provided on this After Visit Summary.  MyChart is used to connect with patients for Virtual Visits (Telemedicine).  Patients are able to view lab/test results, encounter notes, upcoming appointments, etc.  Non-urgent messages can be sent to your provider as well.   To learn more about what you can do with MyChart,  go to [email or web address].    Your next appointment:   1 year(s)  The format for your next appointment:   In Person  Provider:   Virl Axe, MD   Other Instructions  Torsemide Oral Tablets What is this medicine? TORSEMIDE (TORE se mide) is a diuretic. It helps you make more urine and lose salt and water from your body. It treats swelling from heart, kidney, or liver disease. It also treats high blood pressure. This medicine may be used for other purposes; ask your health care provider or pharmacist if you have questions. COMMON BRAND NAME(S): Demadex What should I tell my health care provider before I take this medicine? They need to know if you have any of these conditions:  high or low levels of electrolytes, like magnesium, potassium, and sodium, in your blood  diabetes  gout  kidney disease  liver disease  an unusual or allergic reaction to torsemide, povidone, other medicines, foods, dyes, or preservatives  pregnant or trying to get pregnant  breast-feeding How should I use this medicine? Take this drug by mouth with water. Take it as directed on the prescription label at the same time every day. Keep taking it unless your health care provider tells you to stop. Talk to your health care provider about the use of this drug in children. Special care may be needed. Overdosage: If you think you have taken too much of this medicine contact a poison control center or emergency room at once.  NOTE: This medicine is only for you. Do not share this medicine with others. What if I miss a dose? If you miss a dose, take it as soon as you can. If it is almost time for your next dose, take only that dose. Do not take double or extra doses. What may interact with this medicine?  alcohol  aspirin and aspirin-like medicines  celecoxib  certain medicines for blood pressure, heart disease, irregular heartbeat  certain medicines for cholesterol like  cholestyramine  certain medicines for diabetes  cisplatin  cyclosporine  ephedra  ginseng  lithium  medicines for infection like acyclovir, adefovir, amphotericin B, bacitracin, cidofovir, foscarnet, ganciclovir, gentamicin, pentamidine, vancomycin  medicines that relax muscles for surgery  NSAIDs, medicines for pain and inflammation, like ibuprofen or naproxen  other diuretics  pamidronate  probenecid  rifampin  steroid medicines like prednisone or cortisone  warfarin  zoledronic acid This list may not describe all possible interactions. Give your health care provider a list of all the medicines, herbs, non-prescription drugs, or dietary supplements you use. Also tell them if you smoke, drink alcohol, or use illegal drugs. Some items may interact with your medicine. What should I watch for while using this medicine? Visit your health care provider for regular checks on your progress. Tell your health care provider if your symptoms do not start to get better or if they get worse. Check your blood pressure regularly. Ask your health care provider what your blood pressure should be. Also, find out when you should contact him or her. You may need blood work done while you are taking this drug. Do not treat yourself for coughs, colds, or pain while using this drug without asking your health care provider for advice. Some drugs may increase your blood pressure. This drug may increase blood sugar. Ask your health care provider if changes in diet or drugs are needed if you have diabetes. You may need to be on a special diet while you are taking this drug. Ask your health care provider. Also, find out how many glasses of fluids you need to drink each day. Check with your health care provider if you have severe diarrhea, nausea, and vomiting, or if you sweat a lot. The loss of too much body fluid may make it dangerous for you to take this drug. You may get drowsy or dizzy. Do not  drive, use machinery, or do anything that needs mental alertness until you know how this drug affects you. Do not stand or sit up quickly, especially if you are an older patient. This reduces the risk of dizzy or fainting spells. Alcohol may interfere with the effects of this drug. Avoid alcoholic drinks. What side effects may I notice from receiving this medicine? Side effects that you should report to your doctor or health care professional as soon as possible:  allergic reactions (skin rash, itching or hives; swelling of the face, lips, or tongue)  decreased hearing, ringing in the ears  electrolyte imbalance (increased thirst; loss of appetite; severe diarrhea; unusual sweating; vomiting)  kidney injury (trouble passing urine or change in the amount of urine)  low potassium (trouble breathing, chest pain; dizziness; fast, irregular heartbeat; feeling faints or lightheaded, falls; muscle cramps or pain) Side effects that usually do not require medical attention (report to your doctor or health care professional if they continue or are bothersome):  passing large amounts of urine  stomach pain This list may not describe all possible side effects. Call your  doctor for medical advice about side effects. You may report side effects to FDA at 1-800-FDA-1088. Where should I keep my medicine? Keep out of the reach of children and pets. Store at room temperature between 15 and 30 degrees C (59 and 86 degrees F). Do not freeze. Throw away any unused drug after the expiration date. NOTE: This sheet is a summary. It may not cover all possible information. If you have questions about this medicine, talk to your doctor, pharmacist, or health care provider.  2020 Elsevier/Gold Standard (2019-08-14 19:52:40)   Losartan Tablets What is this medicine? LOSARTAN (loe SAR tan) is an angiotensin II receptor blocker, also known as an ARB. It treats high blood pressure. It can slow kidney damage in some  patients. It may also be used to lower the risk of stroke. This medicine may be used for other purposes; ask your health care provider or pharmacist if you have questions. COMMON BRAND NAME(S): Cozaar What should I tell my health care provider before I take this medicine? They need to know if you have any of these conditions:  heart failure  kidney or liver disease  an unusual or allergic reaction to losartan, other medicines, foods, dyes, or preservatives  pregnant or trying to get pregnant  breast-feeding How should I use this medicine? Take this drug by mouth. Take it as directed on the prescription label at the same time every day. You can take it with or without food. If it upsets your stomach, take it with food. Keep taking it unless your health care provider tells you to stop. Talk to your health care provider about the use of this drug in children. While it may be prescribed for children as young as 6 for selected conditions, precautions do apply. Overdosage: If you think you have taken too much of this medicine contact a poison control center or emergency room at once. NOTE: This medicine is only for you. Do not share this medicine with others. What if I miss a dose? If you miss a dose, take it as soon as you can. If it is almost time for your next dose, take only that dose. Do not take double or extra doses. What may interact with this medicine?  blood pressure medicines  diuretics, especially triamterene, spironolactone, or amiloride  fluconazole  NSAIDs, medicines for pain and inflammation, like ibuprofen or naproxen  potassium salts or potassium supplements  rifampin This list may not describe all possible interactions. Give your health care provider a list of all the medicines, herbs, non-prescription drugs, or dietary supplements you use. Also tell them if you smoke, drink alcohol, or use illegal drugs. Some items may interact with your medicine. What should I watch  for while using this medicine? Visit your doctor or health care professional for regular checks on your progress. Check your blood pressure as directed. Ask your doctor or health care professional what your blood pressure should be and when you should contact him or her. Call your doctor or health care professional if you notice an irregular or fast heart beat. Women should inform their doctor if they wish to become pregnant or think they might be pregnant. There is a potential for serious side effects to an unborn child, particularly in the second or third trimester. Talk to your health care professional or pharmacist for more information. You may get drowsy or dizzy. Do not drive, use machinery, or do anything that needs mental alertness until you know how this drug affects  you. Do not stand or sit up quickly, especially if you are an older patient. This reduces the risk of dizzy or fainting spells. Alcohol can make you more drowsy and dizzy. Avoid alcoholic drinks. Avoid salt substitutes unless you are told otherwise by your doctor or health care professional. Do not treat yourself for coughs, colds, or pain while you are taking this medicine without asking your doctor or health care professional for advice. Some ingredients may increase your blood pressure. What side effects may I notice from receiving this medicine? Side effects that you should report to your doctor or health care professional as soon as possible:  confusion, dizziness, light headedness or fainting spells  decreased amount of urine passed  difficulty breathing or swallowing, hoarseness, or tightening of the throat  fast or irregular heart beat, palpitations, or chest pain  skin rash, itching  swelling of your face, lips, tongue, hands, or feet Side effects that usually do not require medical attention (report to your doctor or health care professional if they continue or are bothersome):  cough  decreased sexual function  or desire  headache  nasal congestion or stuffiness  nausea or stomach pain  sore or cramping muscles This list may not describe all possible side effects. Call your doctor for medical advice about side effects. You may report side effects to FDA at 1-800-FDA-1088. Where should I keep my medicine? Keep out of the reach of children and pets. Store at room temperature between 15 and 30 degrees C (59 and 86 degrees F). Protect from light. Keep the container tightly closed. Throw away any unused drug after the expiration date. NOTE: This sheet is a summary. It may not cover all possible information. If you have questions about this medicine, talk to your doctor, pharmacist, or health care provider.  2020 Elsevier/Gold Standard (2019-07-02 12:12:28)

## 2020-11-19 DIAGNOSIS — H2512 Age-related nuclear cataract, left eye: Secondary | ICD-10-CM | POA: Diagnosis not present

## 2020-11-19 DIAGNOSIS — J449 Chronic obstructive pulmonary disease, unspecified: Secondary | ICD-10-CM | POA: Diagnosis not present

## 2020-11-23 ENCOUNTER — Encounter: Payer: Self-pay | Admitting: Ophthalmology

## 2020-11-24 NOTE — Progress Notes (Signed)
Subjective:   Joseph Macdonald is a 74 y.o. male who presents for Medicare Annual/Subsequent preventive examination.  I connected with Joseph Macdonald today by telephone and verified that I am speaking with the correct person using two identifiers. Location patient: home Location provider: work Persons participating in the virtual visit: patient, provider.   I discussed the limitations, risks, security and privacy concerns of performing an evaluation and management service by telephone and the availability of in person appointments. I also discussed with the patient that there may be a patient responsible charge related to this service. The patient expressed understanding and verbally consented to this telephonic visit.    Interactive audio and video telecommunications were attempted between this provider and patient, however failed, due to patient having technical difficulties OR patient did not have access to video capability.  We continued and completed visit with audio only.   Review of Systems    N/A  Cardiac Risk Factors include: advanced age (>51men, >54 women);dyslipidemia;hypertension;male gender     Objective:    There were no vitals filed for this visit. There is no height or weight on file to calculate BMI.  Advanced Directives 11/25/2020 11/29/2019 10/30/2019 06/10/2019 10/28/2018 10/25/2017 05/15/2016  Does Patient Have a Medical Advance Directive? Yes No Yes No No No No  Type of Paramedic of [place];Living will - [place];Living will - - - -  Copy of [place] in Chart? No - copy requested - No - copy requested - - - -  Would patient like information on creating a medical advance directive? - No - Patient declined No - Patient declined No - Patient declined - No - Patient declined -    Current Medications (verified) Outpatient Encounter Medications as of 11/25/2020  Medication Sig  . albuterol  (PROVENTIL HFA;VENTOLIN HFA) 108 (90 Base) MCG/ACT inhaler Inhale into the lungs every 6 (six) hours as needed for wheezing or shortness of breath.  Marland Kitchen apixaban (ELIQUIS) 2.5 MG TABS tablet Take 2.5 mg by mouth 2 (two) times daily.   . budesonide-formoterol (SYMBICORT) 160-4.5 MCG/ACT inhaler Inhale 2 puffs into the lungs 2 (two) times daily.  . cholecalciferol (VITAMIN D) 25 MCG (1000 UNIT) tablet Take 2,000 Units by mouth daily.  . famotidine (PEPCID) 20 MG tablet Take 20 mg by mouth as needed for heartburn or indigestion.  . flecainide (TAMBOCOR) 50 MG tablet Take 50 mg by mouth 2 (two) times daily.  Marland Kitchen HYDROcodone-acetaminophen (NORCO/VICODIN) 5-325 MG tablet Take 1 tablet by mouth every 6 (six) hours as needed for moderate pain.  Marland Kitchen losartan (COZAAR) 25 MG tablet Take 1 tablet (25 mg total) by mouth daily.  Marland Kitchen omeprazole (PRILOSEC) 40 MG capsule Take 40 mg by mouth 2 (two) times daily.   . pramipexole (MIRAPEX) 0.5 MG tablet Take 1 tablet (0.5 mg total) by mouth at bedtime.  . pravastatin (PRAVACHOL) 20 MG tablet Take 20 mg by mouth daily.  . predniSONE (DELTASONE) 20 MG tablet Take 15 mg by mouth daily with breakfast.  . pyridostigmine (MESTINON) 60 MG tablet Take 60 mg by mouth 3 (three) times daily.   Marland Kitchen torsemide (DEMADEX) 20 MG tablet Take 1- 2 tablets (20 mg- 40 mg) by mouth once EVERY OTHER day as directed  . vitamin B-12 (CYANOCOBALAMIN) 1000 MCG tablet Take 1,000 mcg by mouth daily.  . Cholecalciferol (VITAMIN D3) 1.25 MG (50000 UT) CAPS Take by mouth daily. (Patient not taking: Reported on 11/25/2020)  . metoprolol  tartrate (LOPRESSOR) 25 MG tablet Take 0.5 tablets (12.5 mg total) by mouth 2 (two) times daily. (Patient not taking: Reported on 11/23/2020)  . potassium chloride (KLOR-CON) 20 MEQ packet Take 20 mEq by mouth daily. (Patient not taking: No sig reported)  . sulfamethoxazole-trimethoprim (BACTRIM) 400-80 MG tablet Take 1 tablet by mouth daily. (Patient not taking: Reported on  11/25/2020)  . tiotropium (SPIRIVA) 18 MCG inhalation capsule Place 18 mcg into inhaler and inhale daily. (Patient not taking: Reported on 11/25/2020)   No facility-administered encounter medications on file as of 11/25/2020.    Allergies (verified) Patient has no known allergies.   History: Past Medical History:  Diagnosis Date  . Arthritis   . COPD (chronic obstructive pulmonary disease) ([place])   . Diabetes mellitus without complication (HCC)    diet controlled  . Dyspnea    with activity  . Hyperlipidemia   . Hypertension   . Myasthenia gravis ([place])   . Oxygen deficit   . Ventricular tachycardia [facility])    Past Surgical History:  Procedure Laterality Date  . carpel tunnel sx    . CATARACT EXTRACTION     right eye  . RETINAL DETACHMENT SURGERY    . SKIN GRAFT     Behind left knee  . TONSILLECTOMY AND ADENOIDECTOMY     Family History  Problem Relation Age of Onset  . Hypertension Mother   . Thyroid disease Mother   . Kidney disease Mother   . Arthritis Mother   . Stroke Father   . Heart disease Father 67  . Arthritis Father   . Heart attack Father   . Arthritis Other    Social History   Socioeconomic History  . Marital status: Married    Spouse name: Not on file  . Number of children: 1  . Years of education: H/S  . Highest education level: High school graduate  Occupational History  . Occupation: Retired Building surveyor  Tobacco Use  . Smoking status: Former Smoker    Packs/day: 0.50    Years: 40.00    Pack years: 20.00    Types: Pipe, Cigarettes    Quit date: 08/19/2017    Years since quitting: 3.2  . Smokeless tobacco: Former Systems developer  . Tobacco comment: previously smoked 1 1/2 pack since his 74 years old  Vaping Use  . Vaping Use: Former  . Substances: Nicotine, Flavoring  Substance and Sexual Activity  . Alcohol use: Yes    Comment: occasionally- monthly or less  . Drug use: No  . Sexual activity: Not on file  Other Topics Concern  .  Not on file  Social History Narrative   2 adopted children   Social Determinants of Health   Financial Resource Strain: Low Risk   . Difficulty of Paying Living Expenses: Not hard at all  Food Insecurity: No Food Insecurity  . Worried About Charity fundraiser in the Last Year: Never true  . Ran Out of Food in the Last Year: Never true  Transportation Needs: No Transportation Needs  . Lack of Transportation (Medical): No  . Lack of Transportation (Non-Medical): No  Physical Activity: Inactive  . Days of Exercise per Week: 0 days  . Minutes of Exercise per Session: 0 min  Stress: No Stress Concern Present  . Feeling of Stress : Only a little  Social Connections: Socially Isolated  . Frequency of Communication with Friends and Family: Twice a week  . Frequency of Social Gatherings with Friends  and Family: Never  . Attends Religious Services: Never  . Active Member of Clubs or Organizations: No  . Attends Archivist Meetings: Never  . Marital Status: Married    Tobacco Counseling Counseling given: Not Answered Comment: previously smoked 1 1/2 pack since his 74 years old   Clinical Intake:  Pre-visit preparation completed: Yes  Pain : No/denies pain (Has chronic left side pain.)     Nutritional Risks: Nausea/ vomitting/ diarrhea (Diarrhea occasionally due to medications.) Diabetes:  (Prediabetic)  How often do you need to have someone help you when you read instructions, pamphlets, or other written materials from your doctor or pharmacy?: 1 - Never  Diabetic? Pre-diabetic  Interpreter Needed?: No  Information entered by :: [facility], LPN   Activities of Daily Living In your present state of health, do you have any difficulty performing the following activities: 11/25/2020  Hearing? N  Vision? N  Comment Wears eyeglasses daily.  Difficulty concentrating or making decisions? N  Walking or climbing stairs? Y  Comment Due to back pain and SOB.  Dressing  or bathing? N  Doing errands, shopping? N  Preparing Food and eating ? N  Using the Toilet? N  In the past six months, have you accidently leaked urine? N  Do you have problems with loss of bowel control? N  Managing your Medications? N  Managing your Finances? N  Housekeeping or managing your Housekeeping? N  Some recent data might be hidden    Patient Care Team: [name], MD as PCP - General (Family Medicine) Rockey Situ, Kathlene November, MD as PCP - Cardiology (Cardiology) Desiree Hane, [place] as Consulting Physician (Physician Assistant) Dorcas Carrow, MD as Referring Physician (Hematology and Oncology) Angelique Blonder, MD as Referring Physician (Neurology) Deboraha Sprang, MD as Consulting Physician (Cardiology) End, Harrell Gave, MD as Consulting Physician (Cardiology) Leandrew Koyanagi, MD as Referring Physician (Ophthalmology) Jannet Mantis, MD (Dermatology)  Indicate any recent Medical Services you may have received from other than [facility] providers in the past year (date may be approximate).     Assessment:   This is a routine wellness examination for [place].  Hearing/Vision screen No exam data present  Dietary issues and exercise activities discussed: Current Exercise Habits: The patient does not participate in regular exercise at present, Exercise limited by: orthopedic condition(s)  Goals    . Reduce portion size     Recommend cutting portion sizes in half and eating 3 small meals a day with 2 healthy snacks.       Depression Screen PHQ 2/9 Scores 11/25/2020 10/28/2018 10/25/2017 08/28/2017 05/15/2016  PHQ - 2 Score 0 0 0 0 0  PHQ- 9 Score - - 2 0 -    Fall Risk Fall Risk  11/25/2020 10/30/2019 10/28/2018 10/25/2017 08/28/2017  Falls in the past year? 0 1 0 No No  Number falls in past yr: 0 1 - - -  Injury with Fall? 0 0 - - -  Risk for fall due to : - Other (Comment) - - -  Risk for fall due to: Comment - respiratory issues - - -  Follow up - Falls  prevention discussed - - -    FALL RISK PREVENTION PERTAINING TO THE HOME:  Any stairs in or around the home? No  If so, are there any without handrails? No  Home free of loose throw rugs in walkways, pet beds, electrical cords, etc? Yes  Adequate lighting in your home to reduce risk of  falls? Yes   ASSISTIVE DEVICES UTILIZED TO PREVENT FALLS:  Life alert? No  Use of a cane, walker or w/c? Yes  Grab bars in the bathroom? Yes  Shower chair or bench in shower? Yes  Elevated toilet seat or a handicapped toilet? No    Cognitive Function: Normal cognitive status assessed by direct observation by this Nurse Health Advisor. No abnormalities found.          Immunizations Immunization History  Administered Date(s) Administered  . Fluad Quad(high Dose 65+) 09/08/2020  . Influenza, High Dose Seasonal PF 08/21/2015, 09/12/2016, 08/28/2017, 09/11/2018  . PFIZER SARS-COV-2 Vaccination 01/22/2020, 02/16/2020, 10/21/2020  . Pneumococcal Conjugate-13 03/11/2014    TDAP status: Due, Education has been provided regarding the importance of this vaccine. Advised may receive this vaccine at local pharmacy or Health Dept. Aware to provide a copy of the vaccination record if obtained from local pharmacy or Health Dept. Verbalized acceptance and understanding.  Flu Vaccine status: Up to date  Pneumococcal vaccine status: Due, Education has been provided regarding the importance of this vaccine. Advised may receive this vaccine at local pharmacy or Health Dept. Aware to provide a copy of the vaccination record if obtained from local pharmacy or Health Dept. Verbalized acceptance and understanding.  Covid-19 vaccine status: Completed vaccines  Qualifies for Shingles Vaccine? Yes   Zostavax completed No   Shingrix Completed?: No.    Education has been provided regarding the importance of this vaccine. Patient has been advised to call insurance company to determine out of pocket expense if they have  not yet received this vaccine. Advised may also receive vaccine at local pharmacy or Health Dept. Verbalized acceptance and understanding.  Screening Tests Health Maintenance  Topic Date Due  . Hepatitis C Screening  Never done  . TETANUS/TDAP  Never done  . PNA vac Low Risk Adult (2 of 2 - PPSV23) 03/12/2015  . COLONOSCOPY  05/08/2017  . INFLUENZA VACCINE  Completed  . COVID-19 Vaccine  Completed    Health Maintenance  Health Maintenance Due  Topic Date Due  . Hepatitis C Screening  Never done  . TETANUS/TDAP  Never done  . PNA vac Low Risk Adult (2 of 2 - PPSV23) 03/12/2015  . COLONOSCOPY  05/08/2017    Colorectal cancer screening: Currently due. Pt to f/u with PCP at [facility] for colonoscopy referral.   Lung Cancer Screening: (Low Dose CT Chest recommended if Age 3-80 years, 30 pack-year currently smoking OR have quit w/in 15years.) does qualify.    Additional Screening:  Hepatitis C Screening: does qualify; however pt states he had this completed at [place]. Requested records to up date chart.   Vision Screening: Recommended annual ophthalmology exams for early detection of glaucoma and other disorders of the eye. Is the patient up to date with their annual eye exam?  Yes  Who is the provider or what is the name of the office in which the patient attends annual eye exams? [facility] [place] - unsure of provider. If pt is not established with a provider, would they like to be referred to a provider to establish care? No .   Dental Screening: Recommended annual dental exams for proper oral hygiene  Community Resource Referral / Chronic Care Management: CRR required this visit?  No   CCM required this visit?  No      Plan:     I have personally reviewed and noted the following in the patient's chart:   . Medical and social history .  Use of alcohol, tobacco or illicit drugs  . Current medications and supplements . Functional ability and status . Nutritional status . Physical  activity . Advanced directives . List of other physicians . Hospitalizations, surgeries, and ER visits in previous 12 months . Vitals . Screenings to include cognitive, depression, and falls . Referrals and appointments  In addition, I have reviewed and discussed with patient certain preventive protocols, quality metrics, and best practice recommendations. A written personalized care plan for preventive services as well as general preventive health recommendations were provided to patient.     Aseel Uhde [place], [place]   28/00/3491   Nurse Notes: Pt states he is up to date on his Hep C lab, tetanus vaccine and Pneumovax 23 vaccine. Requested those records from [place] to up date chart. [facility].

## 2020-11-25 ENCOUNTER — Other Ambulatory Visit: Payer: Self-pay

## 2020-11-25 ENCOUNTER — Ambulatory Visit (INDEPENDENT_AMBULATORY_CARE_PROVIDER_SITE_OTHER): Payer: PPO

## 2020-11-25 DIAGNOSIS — Z0000 Encounter for general adult medical examination without abnormal findings: Secondary | ICD-10-CM

## 2020-11-25 NOTE — Patient Instructions (Signed)
[name] , Thank you for taking time to come for your Medicare Wellness Visit. I appreciate your ongoing commitment to your health goals. Please review the following plan we discussed and let me know if I can assist you in the future.   Screening recommendations/referrals: Colonoscopy: Currently due, [facility] is [facility] patient. Recommended yearly ophthalmology/optometry visit for glaucoma screening and checkup Recommended yearly dental visit for hygiene and checkup  Vaccinations: Influenza vaccine: Done 09/08/20 Pneumococcal vaccine: Up to date per patient. Request records from the [place].  Tdap vaccine: Up to date per patient. Request records from the [place].  Shingles vaccine: Shingrix discussed. Please contact your pharmacy for coverage information.     Advanced directives: Please bring a copy of your POA (Power of Attorney) and/or Living Will to your next appointment.   Conditions/risks identified: Recommend cutting portion sizes in half and eating 3 small meals a day with 2 healthy snacks.   Next appointment: 12/01/20 @ 9:40 AM for an AWV. Declined scheduling a follow up with PCP at this time.  Preventive Care 73 Years and Older, Male Preventive care refers to lifestyle choices and visits with your health care provider that can promote health and wellness. What does preventive care include?  A yearly physical exam. This is also called an annual well check.  Dental exams once or twice a year.  Routine eye exams. Ask your health care provider how often you should have your eyes checked.  Personal lifestyle choices, including:  Daily care of your teeth and gums.  Regular physical activity.  Eating a healthy diet.  Avoiding tobacco and drug use.  Limiting alcohol use.  Practicing safe sex.  Taking low doses of aspirin every day.  Taking vitamin and mineral supplements as recommended by your health care provider. What happens during an annual well  check? The services and screenings done by your health care provider during your annual well check will depend on your age, overall health, lifestyle risk factors, and family history of disease. Counseling  Your health care provider may ask you questions about your:  Alcohol use.  Tobacco use.  Drug use.  Emotional well-being.  Home and relationship well-being.  Sexual activity.  Eating habits.  History of falls.  Memory and ability to understand (cognition).  Work and work Statistician. Screening  You may have the following tests or measurements:  Height, weight, and BMI.  Blood pressure.  Lipid and cholesterol levels. These may be checked every 5 years, or more frequently if you are over 68 years old.  Skin check.  Lung cancer screening. You may have this screening every year starting at age 60 if you have a 30-pack-year history of smoking and currently smoke or have quit within the past 15 years.  Fecal occult blood test (FOBT) of the stool. You may have this test every year starting at age 46.  Flexible sigmoidoscopy or colonoscopy. You may have a sigmoidoscopy every 5 years or a colonoscopy every 10 years starting at age 32.  Prostate cancer screening. Recommendations will vary depending on your family history and other risks.  Hepatitis C blood test.  Hepatitis B blood test.  Sexually transmitted disease (STD) testing.  Diabetes screening. This is done by checking your blood sugar (glucose) after you have not eaten for a while (fasting). You may have this done every 1-3 years.  Abdominal aortic aneurysm (AAA) screening. You may need this if you are a current or former smoker.  Osteoporosis.  You may be screened starting at age 37 if you are at high risk. Talk with your health care provider about your test results, treatment options, and if necessary, the need for more tests. Vaccines  Your health care provider may recommend certain vaccines, such  as:  Influenza vaccine. This is recommended every year.  Tetanus, diphtheria, and acellular pertussis (Tdap, Td) vaccine. You may need a Td booster every 10 years.  Zoster vaccine. You may need this after age 93.  Pneumococcal 13-valent conjugate (PCV13) vaccine. One dose is recommended after age 18.  Pneumococcal polysaccharide (PPSV23) vaccine. One dose is recommended after age 95. Talk to your health care provider about which screenings and vaccines you need and how often you need them. This information is not intended to replace advice given to you by your health care provider. Make sure you discuss any questions you have with your health care provider. Document Released: 12/24/2015 Document Revised: 08/16/2016 Document Reviewed: 09/28/2015 Elsevier Interactive Patient Education  2017 [place] Prevention in the Home Falls can cause injuries. They can happen to people of all ages. There are many things you can do to make your home safe and to help prevent falls. What can I do on the outside of my home?  Regularly fix the edges of walkways and driveways and fix any cracks.  Remove anything that might make you trip as you walk through a door, such as a raised step or threshold.  Trim any bushes or trees on the path to your home.  Use bright outdoor lighting.  Clear any walking paths of anything that might make someone trip, such as rocks or tools.  Regularly check to see if handrails are loose or broken. Make sure that both sides of any steps have handrails.  Any raised decks and porches should have guardrails on the edges.  Have any leaves, snow, or ice cleared regularly.  Use sand or salt on walking paths during winter.  Clean up any spills in your garage right away. This includes oil or grease spills. What can I do in the bathroom?  Use night lights.  Install grab bars by the toilet and in the tub and shower. Do not use towel bars as grab bars.  Use  non-skid mats or decals in the tub or shower.  If you need to sit down in the shower, use a plastic, non-slip stool.  Keep the floor dry. Clean up any water that spills on the floor as soon as it happens.  Remove soap buildup in the tub or shower regularly.  Attach bath mats securely with double-sided non-slip rug tape.  Do not have throw rugs and other things on the floor that can make you trip. What can I do in the bedroom?  Use night lights.  Make sure that you have a light by your bed that is easy to reach.  Do not use any sheets or blankets that are too big for your bed. They should not hang down onto the floor.  Have a firm chair that has side arms. You can use this for support while you get dressed.  Do not have throw rugs and other things on the floor that can make you trip. What can I do in the kitchen?  Clean up any spills right away.  Avoid walking on wet floors.  Keep items that you use a lot in easy-to-reach places.  If you need to reach something above you, use a strong step stool  that has a grab bar.  Keep electrical cords out of the way.  Do not use floor polish or wax that makes floors slippery. If you must use wax, use non-skid floor wax.  Do not have throw rugs and other things on the floor that can make you trip. What can I do with my stairs?  Do not leave any items on the stairs.  Make sure that there are handrails on both sides of the stairs and use them. Fix handrails that are broken or loose. Make sure that handrails are as long as the stairways.  Check any carpeting to make sure that it is firmly attached to the stairs. Fix any carpet that is loose or worn.  Avoid having throw rugs at the top or bottom of the stairs. If you do have throw rugs, attach them to the floor with carpet tape.  Make sure that you have a light switch at the top of the stairs and the bottom of the stairs. If you do not have them, ask someone to add them for you. What  else can I do to help prevent falls?  Wear shoes that:  Do not have high heels.  Have rubber bottoms.  Are comfortable and fit you well.  Are closed at the toe. Do not wear sandals.  If you use a stepladder:  Make sure that it is fully opened. Do not climb a closed stepladder.  Make sure that both sides of the stepladder are locked into place.  Ask someone to hold it for you, if possible.  Clearly mark and make sure that you can see:  Any grab bars or handrails.  First and last steps.  Where the edge of each step is.  Use tools that help you move around (mobility aids) if they are needed. These include:  Canes.  Walkers.  Scooters.  Crutches.  Turn on the lights when you go into a dark area. Replace any light bulbs as soon as they burn out.  Set up your furniture so you have a clear path. Avoid moving your furniture around.  If any of your floors are uneven, fix them.  If there are any pets around you, be aware of where they are.  Review your medicines with your doctor. Some medicines can make you feel dizzy. This can increase your chance of falling. Ask your doctor what other things that you can do to help prevent falls. This information is not intended to replace advice given to you by your health care provider. Make sure you discuss any questions you have with your health care provider. Document Released: 09/23/2009 Document Revised: 05/04/2016 Document Reviewed: 01/01/2015 Elsevier Interactive Patient Education  2017 [employer].

## 2020-11-29 ENCOUNTER — Other Ambulatory Visit
Admission: RE | Admit: 2020-11-29 | Discharge: 2020-11-29 | Disposition: A | Discharge status: Home / Self Care | Payer: PPO | Source: Ambulatory Visit | Attending: Ophthalmology | Admitting: Ophthalmology

## 2020-11-29 ENCOUNTER — Other Ambulatory Visit: Payer: Self-pay

## 2020-11-29 DIAGNOSIS — Z20822 Contact with and (suspected) exposure to covid-19: Secondary | ICD-10-CM | POA: Insufficient documentation

## 2020-11-29 DIAGNOSIS — Z01812 Encounter for preprocedural laboratory examination: Secondary | ICD-10-CM | POA: Insufficient documentation

## 2020-11-29 LAB — SARS CORONAVIRUS 2 (TAT 6-24 HRS): SARS Coronavirus 2: NEGATIVE

## 2020-11-29 NOTE — Discharge Instructions (Signed)

## 2020-12-01 ENCOUNTER — Other Ambulatory Visit: Payer: Self-pay

## 2020-12-01 ENCOUNTER — Ambulatory Visit: Payer: PPO | Admitting: Anesthesiology

## 2020-12-01 ENCOUNTER — Encounter
Admission: RE | Disposition: A | Discharge status: Home / Self Care | Payer: Self-pay | Source: Home / Self Care | Attending: Ophthalmology

## 2020-12-01 ENCOUNTER — Encounter: Payer: Self-pay | Admitting: Ophthalmology

## 2020-12-01 ENCOUNTER — Ambulatory Visit
Admission: RE | Admit: 2020-12-01 | Discharge: 2020-12-01 | Disposition: A | Discharge status: Home / Self Care | Payer: PPO | Attending: Ophthalmology | Admitting: Ophthalmology

## 2020-12-01 DIAGNOSIS — I509 Heart failure, unspecified: Secondary | ICD-10-CM | POA: Insufficient documentation

## 2020-12-01 DIAGNOSIS — I110 Hypertensive heart disease with heart failure: Secondary | ICD-10-CM | POA: Diagnosis not present

## 2020-12-01 DIAGNOSIS — H2512 Age-related nuclear cataract, left eye: Secondary | ICD-10-CM | POA: Diagnosis not present

## 2020-12-01 DIAGNOSIS — H25812 Combined forms of age-related cataract, left eye: Secondary | ICD-10-CM | POA: Diagnosis not present

## 2020-12-01 DIAGNOSIS — J449 Chronic obstructive pulmonary disease, unspecified: Secondary | ICD-10-CM | POA: Insufficient documentation

## 2020-12-01 DIAGNOSIS — Z9981 Dependence on supplemental oxygen: Secondary | ICD-10-CM | POA: Insufficient documentation

## 2020-12-01 DIAGNOSIS — G7000 Myasthenia gravis without (acute) exacerbation: Secondary | ICD-10-CM | POA: Insufficient documentation

## 2020-12-01 HISTORY — DX: Chronic obstructive pulmonary disease, unspecified: J44.9

## 2020-12-01 HISTORY — DX: Unspecified osteoarthritis, unspecified site: M19.90

## 2020-12-01 HISTORY — DX: Type 2 diabetes mellitus without complications: E11.9

## 2020-12-01 HISTORY — PX: CATARACT EXTRACTION W/PHACO: SHX586

## 2020-12-01 HISTORY — DX: Dyspnea, unspecified: R06.00

## 2020-12-01 SURGERY — PHACOEMULSIFICATION, CATARACT, WITH IOL INSERTION
Anesthesia: Monitor Anesthesia Care | Site: Eye | Laterality: Left

## 2020-12-01 MED ORDER — ACETAMINOPHEN 160 MG/5ML PO SOLN: 325.0000 mg | Freq: Once | ORAL | Status: DC

## 2020-12-01 MED ORDER — NA HYALUR & NA CHOND-NA HYALUR 0.4-0.35 ML IO KIT
PACK | INTRAOCULAR | Status: DC | PRN
  Administered 2020-12-01: 1 mL via INTRAOCULAR

## 2020-12-01 MED ORDER — LACTATED RINGERS IV SOLN: INTRAVENOUS | Status: DC

## 2020-12-01 MED ORDER — ACETAMINOPHEN 325 MG PO TABS: 325.0000 mg | ORAL_TABLET | Freq: Once | ORAL | Status: DC

## 2020-12-01 MED ORDER — MIDAZOLAM HCL 2 MG/2ML IJ SOLN
INTRAMUSCULAR | Status: DC | PRN
  Administered 2020-12-01: 1.5 mg via INTRAVENOUS

## 2020-12-01 MED ORDER — FENTANYL CITRATE (PF) 100 MCG/2ML IJ SOLN
INTRAMUSCULAR | Status: DC | PRN
  Administered 2020-12-01: 50 ug via INTRAVENOUS

## 2020-12-01 MED ORDER — LIDOCAINE HCL (PF) 2 % IJ SOLN
INTRAOCULAR | Status: DC | PRN
  Administered 2020-12-01: 1 mL

## 2020-12-01 MED ORDER — CEFUROXIME OPHTHALMIC INJECTION 1 MG/0.1 ML
INJECTION | OPHTHALMIC | Status: DC | PRN
  Administered 2020-12-01: 0.1 mL via INTRACAMERAL

## 2020-12-01 MED ORDER — EPINEPHRINE PF 1 MG/ML IJ SOLN
INTRAOCULAR | Status: DC | PRN
  Administered 2020-12-01: 11:00:00 58 mL via OPHTHALMIC

## 2020-12-01 MED ORDER — ARMC OPHTHALMIC DILATING DROPS
1.0000 "application " | OPHTHALMIC | Status: DC | PRN
  Administered 2020-12-01 (×3): 1 via OPHTHALMIC

## 2020-12-01 MED ORDER — TETRACAINE HCL 0.5 % OP SOLN
1.0000 [drp] | OPHTHALMIC | Status: DC | PRN
  Administered 2020-12-01 (×3): 1 [drp] via OPHTHALMIC

## 2020-12-01 MED ORDER — BRIMONIDINE TARTRATE-TIMOLOL 0.2-0.5 % OP SOLN
OPHTHALMIC | Status: DC | PRN
  Administered 2020-12-01: 1 [drp] via OPHTHALMIC

## 2020-12-01 SURGICAL SUPPLY — 23 items
CANNULA ANT/CHMB 27G (MISCELLANEOUS) ×1 IMPLANT
CANNULA ANT/CHMB 27GA (MISCELLANEOUS) ×3 IMPLANT
GLOVE SURG LX 7.5 STRW (GLOVE) ×2
GLOVE SURG LX STRL 7.5 STRW (GLOVE) ×1 IMPLANT
GLOVE SURG TRIUMPH 8.0 PF LTX (GLOVE) ×3 IMPLANT
GOWN STRL REUS W/ TWL LRG LVL3 (GOWN DISPOSABLE) ×2 IMPLANT
GOWN STRL REUS W/TWL LRG LVL3 (GOWN DISPOSABLE) ×6
LENS IOL ACRSF IQ ULTRA 24.0 (Intraocular Lens) IMPLANT
LENS IOL ACRYSOF IQ 24.0 (Intraocular Lens) ×3 IMPLANT
MARKER SKIN DUAL TIP RULER LAB (MISCELLANEOUS) ×3 IMPLANT
NDL CAPSULORHEX 25GA (NEEDLE) ×1 IMPLANT
NDL FILTER BLUNT 18X1 1/2 (NEEDLE) ×2 IMPLANT
NEEDLE CAPSULORHEX 25GA (NEEDLE) ×3 IMPLANT
NEEDLE FILTER BLUNT 18X 1/2SAF (NEEDLE) ×4
NEEDLE FILTER BLUNT 18X1 1/2 (NEEDLE) ×2 IMPLANT
PACK CATARACT BRASINGTON (MISCELLANEOUS) ×3 IMPLANT
PACK EYE AFTER SURG (MISCELLANEOUS) ×3 IMPLANT
PACK OPTHALMIC (MISCELLANEOUS) ×3 IMPLANT
SOLUTION OPHTHALMIC SALT (MISCELLANEOUS) ×3 IMPLANT
SYR 3ML LL SCALE MARK (SYRINGE) ×6 IMPLANT
SYR TB 1ML LUER SLIP (SYRINGE) ×3 IMPLANT
WATER STERILE IRR 250ML POUR (IV SOLUTION) ×3 IMPLANT
WIPE NON LINTING 3.25X3.25 (MISCELLANEOUS) ×3 IMPLANT

## 2020-12-01 NOTE — Transfer of Care (Signed)
Immediate Anesthesia Transfer of Care Note  Patient: Joseph Macdonald  Procedure(s) Performed: CATARACT EXTRACTION PHACO AND INTRAOCULAR LENS PLACEMENT (IOC) LEFT 10.83 01:14.1 14.6% (Left Eye)  Patient Location: PACU  Anesthesia Type: MAC  Level of Consciousness: awake, alert  and patient cooperative  Airway and Oxygen Therapy: Patient Spontanous Breathing and Patient connected to supplemental oxygen  Post-op Assessment: Post-op Vital signs reviewed, Patient's Cardiovascular Status Stable, Respiratory Function Stable, Patent Airway and No signs of Nausea or vomiting  Post-op Vital Signs: Reviewed and stable  Complications: No complications documented.

## 2020-12-01 NOTE — Op Note (Signed)
OPERATIVE NOTE  Joseph Macdonald [id number] 12/01/2020   PREOPERATIVE DIAGNOSIS:  Nuclear sclerotic cataract left eye. H25.12   POSTOPERATIVE DIAGNOSIS:    Nuclear sclerotic cataract left eye.     PROCEDURE:  Phacoemusification with posterior chamber intraocular lens placement of the left eye  Ultrasound time: Procedure(s): CATARACT EXTRACTION PHACO AND INTRAOCULAR LENS PLACEMENT (IOC) LEFT 10.83 01:14.1 14.6% (Left)  LENS:   Implant Name Type Inv. Item Serial No. Manufacturer Lot No. LRB No. Used Action  LENS IOL ACRYSOF IQ 24.0 - [id number] Intraocular Lens LENS IOL ACRYSOF IQ 24.0 [id number] ALCON  Left 1 Implanted      SURGEON:  Wyonia Hough, MD   ANESTHESIA:  Topical with tetracaine drops and 2% Xylocaine jelly, augmented with 1% preservative-free intracameral lidocaine.    COMPLICATIONS:  None.   DESCRIPTION OF PROCEDURE:  The patient was identified in the holding room and transported to the operating room and placed in the supine position under the operating microscope.  The left eye was identified as the operative eye and it was prepped and draped in the usual sterile ophthalmic fashion.   A 1 millimeter clear-corneal paracentesis was made at the 1:30 position.  0.5 ml of preservative-free 1% lidocaine was injected into the anterior chamber.  The anterior chamber was filled with Viscoat viscoelastic.  A 2.4 millimeter keratome was used to make a near-clear corneal incision at the 10:30 position.  .  A curvilinear capsulorrhexis was made with a cystotome and capsulorrhexis forceps.  Balanced salt solution was used to hydrodissect and hydrodelineate the nucleus.   Phacoemulsification was then used in stop and chop fashion to remove the lens nucleus and epinucleus.  The remaining cortex was then removed using the irrigation and aspiration handpiece. Provisc was then placed into the capsular bag to distend it for lens placement.  A lens was then injected into the  capsular bag.  The remaining viscoelastic was aspirated.   Wounds were hydrated with balanced salt solution.  The anterior chamber was inflated to a physiologic pressure with balanced salt solution.  No wound leaks were noted. Cefuroxime 0.1 ml of a 10mg /ml solution was injected into the anterior chamber for a dose of 1 mg of intracameral antibiotic at the completion of the case.   Timolol and Brimonidine drops were applied to the eye.  The patient was taken to the recovery room in stable condition without complications of anesthesia or surgery.  [name] 12/01/2020, 11:01 AM

## 2020-12-01 NOTE — H&P (Signed)

## 2020-12-01 NOTE — Anesthesia Postprocedure Evaluation (Signed)
Anesthesia Post Note  Patient: Joseph Macdonald  Procedure(s) Performed: CATARACT EXTRACTION PHACO AND INTRAOCULAR LENS PLACEMENT (IOC) LEFT 10.83 01:14.1 14.6% (Left Eye)     Patient location during evaluation: PACU Anesthesia Type: MAC Level of consciousness: awake and alert and oriented Pain management: satisfactory to patient Vital Signs Assessment: post-procedure vital signs reviewed and stable Respiratory status: spontaneous breathing, nonlabored ventilation and respiratory function stable Cardiovascular status: blood pressure returned to baseline and stable Postop Assessment: Adequate PO intake and No signs of nausea or vomiting Anesthetic complications: no   No complications documented.  Raliegh Ip

## 2020-12-01 NOTE — Anesthesia Preprocedure Evaluation (Signed)
Anesthesia Evaluation  Patient identified by MRN, date of birth, ID band Patient awake    Reviewed: Allergy & Precautions, H&P , NPO status , Patient's Chart, lab work & pertinent test results  Airway Mallampati: II  TM Distance: >3 FB Neck ROM: full    Dental  (+) Missing, Chipped   Pulmonary shortness of breath, sleep apnea , COPD,  COPD inhaler and oxygen dependent, former smoker,    Pulmonary exam normal breath sounds clear to auscultation       Cardiovascular hypertension, +CHF  Normal cardiovascular exam Rhythm:regular Rate:Normal     Neuro/Psych PSYCHIATRIC DISORDERS  Neuromuscular disease    GI/Hepatic GERD  ,  Endo/Other  diabetesMorbid obesity  Renal/GU Renal disease     Musculoskeletal   Abdominal   Peds  Hematology   Anesthesia Other Findings   Reproductive/Obstetrics                             Anesthesia Physical Anesthesia Plan  ASA: IV  Anesthesia Plan: MAC   Post-op Pain Management:    Induction:   PONV Risk Score and Plan: 1 and Treatment may vary due to age or medical condition, TIVA and Midazolam  Airway Management Planned:   Additional Equipment:   Intra-op Plan:   Post-operative Plan:   Informed Consent: I have reviewed the patients History and Physical, chart, labs and discussed the procedure including the risks, benefits and alternatives for the proposed anesthesia with the patient or authorized representative who has indicated his/her understanding and acceptance.     Dental Advisory Given  Plan Discussed with: CRNA  Anesthesia Plan Comments:         Anesthesia Quick Evaluation

## 2020-12-01 NOTE — Anesthesia Procedure Notes (Signed)
Procedure Name: MAC Performed by: Catheleen Langhorne, CRNA Pre-anesthesia Checklist: Patient identified, Emergency Drugs available, Suction available, Timeout performed and Patient being monitored Patient Re-evaluated:Patient Re-evaluated prior to induction Oxygen Delivery Method: Nasal cannula Placement Confirmation: positive ETCO2       

## 2020-12-14 ENCOUNTER — Encounter: Payer: Self-pay | Admitting: Family Medicine

## 2020-12-14 DIAGNOSIS — M9979 Connective tissue and disc stenosis of intervertebral foramina of abdomen and other regions: Secondary | ICD-10-CM

## 2020-12-14 MED ORDER — HYDROCODONE-ACETAMINOPHEN 5-325 MG PO TABS: 1.0000 | ORAL_TABLET | Freq: Four times a day (QID) | ORAL | 0 refills | Status: DC | PRN

## 2020-12-31 ENCOUNTER — Ambulatory Visit: Payer: PPO | Admitting: Nurse Practitioner

## 2021-01-14 ENCOUNTER — Telehealth: Payer: Self-pay | Admitting: Family Medicine

## 2021-01-14 NOTE — Telephone Encounter (Signed)
   Telephone encounter was:  Successful.  01/14/2021 Name: Joseph Macdonald MRN: [id number] DOB: 25-Nov-1946  [name] is a 75 y.o. year old male who is a primary care patient of Caryn Section, Kirstie Peri, MD . The community resource team was consulted for assistance with benefits with the veterans groups in the area by getting a referral to the [facility].   Care guide performed the following interventions: Patient provided with information about care guide support team and interviewed to confirm resource needs Obtained verbal consent to place patient referral to [facility] and the [place] group.  Placed referral to Chryl Heck via email for placement onto the [employer] [place] website.  via email to [place]. .  Follow Up Plan:  No further follow up planned at this time. The patient has been provided with needed resources.  [place], Care Management Phone: [phone] Email: [email or web address]

## 2021-01-18 ENCOUNTER — Other Ambulatory Visit: Payer: Self-pay | Admitting: Family Medicine

## 2021-01-18 DIAGNOSIS — M9979 Connective tissue and disc stenosis of intervertebral foramina of abdomen and other regions: Secondary | ICD-10-CM

## 2021-01-18 MED ORDER — HYDROCODONE-ACETAMINOPHEN 5-325 MG PO TABS: 1.0000 | ORAL_TABLET | Freq: Four times a day (QID) | ORAL | 0 refills | Status: DC | PRN

## 2021-01-21 ENCOUNTER — Telehealth: Payer: Self-pay | Admitting: Family Medicine

## 2021-01-21 NOTE — Telephone Encounter (Signed)
° °  Telephone encounter was:  Successful.  01/21/2021 Name: Joseph Macdonald MRN: [id number] DOB: September 17, 1946  [name] is a 75 y.o. year old male who is a primary care patient of Fisher, Kirstie Peri, MD . The community resource team was consulted for assistance with VA benefits.   Care guide performed the following interventions: Follow up call placed to community resources to determine status of patients referral Follow up call placed to the patient to discuss status of referral.  Follow Up Plan:  No further follow up planned at this time. The patient has been provided with needed resources.  [place], Care Management Phone: [phone] Email: [email or web address]

## 2021-02-10 DIAGNOSIS — Z859 Personal history of malignant neoplasm, unspecified: Secondary | ICD-10-CM | POA: Diagnosis not present

## 2021-02-10 DIAGNOSIS — L570 Actinic keratosis: Secondary | ICD-10-CM | POA: Diagnosis not present

## 2021-02-10 DIAGNOSIS — L578 Other skin changes due to chronic exposure to nonionizing radiation: Secondary | ICD-10-CM | POA: Diagnosis not present

## 2021-02-10 DIAGNOSIS — D044 Carcinoma in situ of skin of scalp and neck: Secondary | ICD-10-CM | POA: Diagnosis not present

## 2021-02-10 DIAGNOSIS — Z872 Personal history of diseases of the skin and subcutaneous tissue: Secondary | ICD-10-CM | POA: Diagnosis not present

## 2021-02-10 DIAGNOSIS — D485 Neoplasm of uncertain behavior of skin: Secondary | ICD-10-CM | POA: Diagnosis not present

## 2021-02-10 DIAGNOSIS — B356 Tinea cruris: Secondary | ICD-10-CM | POA: Diagnosis not present

## 2021-02-16 ENCOUNTER — Telehealth: Payer: Self-pay | Admitting: Internal Medicine

## 2021-02-16 NOTE — Telephone Encounter (Signed)
Spoke to [place] who is contracted through patient's insurance to collaborate with patient's care team. Last seen in September 2021 by them. She was there for maintenance visit. Today's BP was 122/62 and HR was low.  HR 30, 28 and lastly 41. Patient does not take his BP/HR at home regularly and does not have any recent readings to report. Reviewed patient's meds and they are correct as listed on patient's med list. Denies dizziness, lightheadedness, SOB, chest pain. Says he feels fine.  Last seen by Dr Caryl Comes on 12/09 - BP 146/78, HR 56. At that time metoprolol and furosemide was discontinued. Patient takes his torsemide about 2 times a week.   Advised patient and wife to take BP/HR daily for the next several days. And to let us know promptly if he experiences dizziness, lightheadedness, or continues to have low HR when monitoring at home.  Routing to Dr Caryl Comes for review.

## 2021-02-16 NOTE — Telephone Encounter (Signed)
STAT if HR is under 50 or over 120 (normal HR is 60-100 beats per minute)  1) What is your heart rate? 31 bpm  Do you have a log of your heart rate readings (document readings)? 30, 40, 28 2) Do you have any other symptoms?asympomatic with BP of 122/62

## 2021-02-21 NOTE — Telephone Encounter (Signed)
Late entry: Reviewed the message below with Dr. Caryl Comes verbally on Thursday 02/17/21.  Per Dr. Caryl Comes, he agreed with Meyer Cory, RN's recommendations to continue to monitor BP/ HR for several days and call back with any symptoms of dizziness/ lightheadedness or continued low HR readings.

## 2021-02-22 ENCOUNTER — Encounter: Payer: Self-pay | Admitting: Family Medicine

## 2021-02-22 DIAGNOSIS — M9979 Connective tissue and disc stenosis of intervertebral foramina of abdomen and other regions: Secondary | ICD-10-CM

## 2021-02-23 MED ORDER — HYDROCODONE-ACETAMINOPHEN 5-325 MG PO TABS: 1.0000 | ORAL_TABLET | Freq: Four times a day (QID) | ORAL | 0 refills | Status: DC | PRN

## 2021-02-24 NOTE — Telephone Encounter (Signed)
To scheduling to please arrange a follow up appointment with Dr. Caryl Comes in 8-10 weeks to follow up on his bradycardia.

## 2021-02-24 NOTE — Telephone Encounter (Signed)
Joseph Macdonald  His next appt isnt until 12/22 lets make it 8-12 weeks  Thanks SK

## 2021-02-25 NOTE — Telephone Encounter (Signed)
Attempted to schedule.  LMOV to call office.    Holding klein 10 am on May 19th

## 2021-02-28 NOTE — Telephone Encounter (Signed)
Attempted to call the patient at his home #. No answer- I left a message to please call back.

## 2021-03-07 DIAGNOSIS — C4442 Squamous cell carcinoma of skin of scalp and neck: Secondary | ICD-10-CM | POA: Diagnosis not present

## 2021-03-08 NOTE — Telephone Encounter (Addendum)
Scheduling, can you please try to reach out to the patient again to try to schedule follow up with Dr. Caryl Comes- looks like a slot has been held for him on 5/19 at 10:00 am.

## 2021-03-09 NOTE — Telephone Encounter (Signed)
Left message to call back and schedule follow up with Dr. Caryl Comes.

## 2021-03-16 NOTE — Telephone Encounter (Signed)
Scheduled

## 2021-03-22 DIAGNOSIS — M1711 Unilateral primary osteoarthritis, right knee: Secondary | ICD-10-CM | POA: Diagnosis not present

## 2021-03-28 ENCOUNTER — Encounter: Payer: Self-pay | Admitting: Family Medicine

## 2021-03-28 DIAGNOSIS — M9979 Connective tissue and disc stenosis of intervertebral foramina of abdomen and other regions: Secondary | ICD-10-CM

## 2021-03-28 MED ORDER — HYDROCODONE-ACETAMINOPHEN 5-325 MG PO TABS: 1.0000 | ORAL_TABLET | Freq: Four times a day (QID) | ORAL | 0 refills | Status: DC | PRN

## 2021-04-28 ENCOUNTER — Encounter: Payer: Self-pay | Admitting: Internal Medicine

## 2021-04-28 ENCOUNTER — Other Ambulatory Visit: Payer: Self-pay

## 2021-04-28 ENCOUNTER — Ambulatory Visit: Payer: PPO | Admitting: Internal Medicine

## 2021-04-28 VITALS — BP 116/68 | HR 60 | Ht 67.0 in | Wt 233.0 lb

## 2021-04-28 DIAGNOSIS — I472 Ventricular tachycardia, unspecified: Secondary | ICD-10-CM

## 2021-04-28 DIAGNOSIS — I5032 Chronic diastolic (congestive) heart failure: Secondary | ICD-10-CM

## 2021-04-28 DIAGNOSIS — R001 Bradycardia, unspecified: Secondary | ICD-10-CM | POA: Diagnosis not present

## 2021-04-28 NOTE — Patient Instructions (Signed)
Medication Instructions:  - Your physician recommends that you continue on your current medications as directed. Please refer to the Current Medication list given to you today.  *If you need a refill on your cardiac medications before your next appointment, please call your pharmacy*   Lab Work: - none ordered  If you have labs (blood work) drawn today and your tests are completely normal, you will receive your results only by: Marland Kitchen MyChart Message (if you have MyChart) OR . A paper copy in the mail If you have any lab test that is abnormal or we need to change your treatment, we will call you to review the results.   Testing/Procedures: - none ordered  Follow-Up: At [facility], you and your health needs are our priority.  As part of our continuing mission to provide you with exceptional heart care, we have created designated Provider Care Teams.  These Care Teams include your primary Cardiologist (physician) and Advanced Practice Providers (APPs -  Physician Assistants and Nurse Practitioners) who all work together to provide you with the care you need, when you need it.  We recommend signing up for the patient portal called "MyChart".  Sign up information is provided on this After Visit Summary.  MyChart is used to connect with patients for Virtual Visits (Telemedicine).  Patients are able to view lab/test results, encounter notes, upcoming appointments, etc.  Non-urgent messages can be sent to your provider as well.   To learn more about what you can do with MyChart, go to [email or web address].    Your next appointment:   3-4 month(s)  The format for your next appointment:   In Person  Provider:   Virl Axe, MD   Other Instructions  - Please have the [facility] re-read your previous echocardiogram or have them put this on a disk and bring to our office for Dr. Caryl Comes to review

## 2021-04-28 NOTE — Progress Notes (Signed)
Patient Care Team: [name], MD as PCP - General (Family Medicine) Rockey Situ, Kathlene November, MD as PCP - Cardiology (Cardiology) Desiree Hane, [place] as Consulting Physician (Physician Assistant) Dorcas Carrow, MD as Referring Physician (Hematology and Oncology) Angelique Blonder, MD as Referring Physician (Neurology) Deboraha Sprang, MD as Consulting Physician (Cardiology) End, Harrell Gave, MD as Consulting Physician (Cardiology) Leandrew Koyanagi, MD as Referring Physician (Ophthalmology) Jannet Mantis, MD (Dermatology)   HPI  Joseph Macdonald is a 75 y.o. male seen in follow-up for ventricular tachycardia-symptomatic for which he previously took amiodarone, but was then switched to flecainide (thought to have been done at the [place]).     History of dyspnea on exertion in the setting of normal LV function with septal hypertrophy and no obstructive coronary disease with recurrent diagnosis of pulmonary embolism for which he takes apixaban.    Orthostatic lightheadedness as well as orthostatic dyspnea.  Significant dyspnea on exertion.  Sleep disordered breathing and significant daytime fatigue.  Bilateral edema left greater than right.  Two-pillow orthopnea.  No palpitations or syncope.  Lightheadedness is noted.  Palpitations quiescient  On anticoagulation for DVT at the appropriate dose of 2.5   DATE TEST EF   1/20 Echo   55-60 %   1/20 LHC  * % Cors  w/o obstruc  1/20 cMRI  Asymmetric Septal Hypertrophy 3mm/11mm However impression  Normal ?????   Date Cr K Hgb TSH LFTs  4/20 1.39 4.2   1.19 28  5/20 1.18 3.7 15.5 0.2 31  10/21 0.72 4.5 15.3 6(2/21)    DATE PR interval QRSduration Dose  4/20  186 114 Amio 400  12/21 192  94 Flec 50  5/22 202 110 Flec 50      Records and Results Reviewed   Past Medical History:  Diagnosis Date  . Arthritis   . COPD (chronic obstructive pulmonary disease) ([place])   . Diabetes mellitus without complication  (HCC)    diet controlled  . Dyspnea    with activity  . Hyperlipidemia   . Hypertension   . Myasthenia gravis ([place])   . Oxygen deficit   . Ventricular tachycardia [facility])     Past Surgical History:  Procedure Laterality Date  . carpel tunnel sx    . CATARACT EXTRACTION     right eye  . CATARACT EXTRACTION W/PHACO Left 12/01/2020   Procedure: CATARACT EXTRACTION PHACO AND INTRAOCULAR LENS PLACEMENT (IOC) LEFT 10.83 01:14.1 14.6%;  Surgeon: Leandrew Koyanagi, MD;  Location: [facility];  Service: Ophthalmology;  Laterality: Left;  . RETINAL DETACHMENT SURGERY    . SKIN GRAFT     Behind left knee  . TONSILLECTOMY AND ADENOIDECTOMY      Current Meds  Medication Sig  . albuterol (PROVENTIL HFA;VENTOLIN HFA) 108 (90 Base) MCG/ACT inhaler Inhale into the lungs every 6 (six) hours as needed for wheezing or shortness of breath.  Marland Kitchen apixaban (ELIQUIS) 2.5 MG TABS tablet Take 2.5 mg by mouth 2 (two) times daily.   . budesonide-formoterol (SYMBICORT) 160-4.5 MCG/ACT inhaler Inhale 2 puffs into the lungs 2 (two) times daily.  . cholecalciferol (VITAMIN D) 25 MCG (1000 UNIT) tablet Take 2,000 Units by mouth daily.  . famotidine (PEPCID) 20 MG tablet Take 20 mg by mouth as needed for heartburn or indigestion.  . flecainide (TAMBOCOR) 50 MG tablet Take 50 mg by mouth 2 (two) times daily.  Marland Kitchen HYDROcodone-acetaminophen (NORCO/VICODIN) 5-325 MG tablet Take 1 tablet by mouth  every 6 (six) hours as needed for moderate pain.  Marland Kitchen omeprazole (PRILOSEC) 40 MG capsule Take 40 mg by mouth 2 (two) times daily.   . pramipexole (MIRAPEX) 0.5 MG tablet Take 1 tablet (0.5 mg total) by mouth at bedtime.  . pravastatin (PRAVACHOL) 20 MG tablet Take 20 mg by mouth daily.  . predniSONE (DELTASONE) 20 MG tablet Take 15 mg by mouth daily with breakfast.  . pyridostigmine (MESTINON) 60 MG tablet Take 60 mg by mouth 3 (three) times daily.   Marland Kitchen tiotropium (SPIRIVA) 18 MCG inhalation capsule Place 18 mcg into  inhaler and inhale daily.  Marland Kitchen torsemide (DEMADEX) 20 MG tablet Take 1- 2 tablets (20 mg- 40 mg) by mouth once EVERY OTHER day as directed  . vitamin B-12 (CYANOCOBALAMIN) 1000 MCG tablet Take 1,000 mcg by mouth daily.    No Known Allergies    Review of Systems negative except from HPI and PMH  Physical Exam BP 116/68   Pulse 60   Ht 5\' 7"  (1.702 m)   Wt 233 lb (105.7 kg)   BMI 36.49 kg/m  Well developed and Morbidly obese in no acute distress HENT normal Neck supple with JVP-10 Clear Regular rate and rhythm, no  gallop No  Murmur at rest or with Valsalva Abd-soft with active BS No Clubbing cyanosis 2+ right greater than left edema Skin-warm and dry A & Oriented  Grossly normal sensory and motor function  ECG sinus at 60 Intervals 20/11/42 Borderline voltage  Assessment and  Plan  Ventricular Tachycardia-nonsustained  Asymmetric septal Hypertrophy tomorrow?  Heart failure, diastolic chronic  Sinus Bradycardia  Myasthenia Gravis   Hyperthyroidism-borderline  Orthostatic intolerance--lightheadedness and shortness of breath    Reviewing the patient's scanned report from his cMRI, there are discordant comments.  The narrative describes a septum of 3.2 cm thickness with a posterior wall of 1.1.  The summary describes impression normal chambers.  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  Antihypertensives will be deferred lightening would be using a calcium blocker versus losartan, risk stratification for sudden death, family screening etc.  I have asked him to go back to the [place] and ask for a reread and a clarification of the report and/or to send [place] a disc and we would be glad to have our cardiac imagers look at it.  For now, then we will continue him on his current losartan 25 and torsemide 20.  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 The time spend this day on the patients care was 35 min.

## 2021-05-03 ENCOUNTER — Encounter: Payer: Self-pay | Admitting: Family Medicine

## 2021-05-03 DIAGNOSIS — M9979 Connective tissue and disc stenosis of intervertebral foramina of abdomen and other regions: Secondary | ICD-10-CM

## 2021-05-04 MED ORDER — HYDROCODONE-ACETAMINOPHEN 5-325 MG PO TABS: 1.0000 | ORAL_TABLET | Freq: Four times a day (QID) | ORAL | 0 refills | Status: DC | PRN

## 2021-05-24 ENCOUNTER — Ambulatory Visit (INDEPENDENT_AMBULATORY_CARE_PROVIDER_SITE_OTHER): Payer: PPO | Admitting: Family Medicine

## 2021-05-24 ENCOUNTER — Other Ambulatory Visit: Payer: Self-pay

## 2021-05-24 DIAGNOSIS — Z23 Encounter for immunization: Secondary | ICD-10-CM

## 2021-05-25 NOTE — Progress Notes (Signed)
Vaccine only, no MD visit.  

## 2021-06-03 ENCOUNTER — Encounter: Payer: Self-pay | Admitting: Family Medicine

## 2021-06-03 DIAGNOSIS — L570 Actinic keratosis: Secondary | ICD-10-CM | POA: Diagnosis not present

## 2021-06-03 DIAGNOSIS — Z859 Personal history of malignant neoplasm, unspecified: Secondary | ICD-10-CM | POA: Diagnosis not present

## 2021-06-03 DIAGNOSIS — B356 Tinea cruris: Secondary | ICD-10-CM | POA: Diagnosis not present

## 2021-06-03 DIAGNOSIS — L821 Other seborrheic keratosis: Secondary | ICD-10-CM | POA: Diagnosis not present

## 2021-06-03 DIAGNOSIS — L578 Other skin changes due to chronic exposure to nonionizing radiation: Secondary | ICD-10-CM | POA: Diagnosis not present

## 2021-06-04 MED ORDER — PRAMIPEXOLE DIHYDROCHLORIDE 0.5 MG PO TABS: 0.5000 mg | ORAL_TABLET | Freq: Every day | ORAL | 12 refills | Status: DC

## 2021-06-08 ENCOUNTER — Encounter: Payer: Self-pay | Admitting: Family Medicine

## 2021-06-08 DIAGNOSIS — Z6836 Body mass index (BMI) 36.0-36.9, adult: Secondary | ICD-10-CM | POA: Diagnosis not present

## 2021-06-08 DIAGNOSIS — F330 Major depressive disorder, recurrent, mild: Secondary | ICD-10-CM | POA: Diagnosis not present

## 2021-06-08 DIAGNOSIS — R1084 Generalized abdominal pain: Secondary | ICD-10-CM

## 2021-06-08 DIAGNOSIS — K219 Gastro-esophageal reflux disease without esophagitis: Secondary | ICD-10-CM | POA: Diagnosis not present

## 2021-06-08 DIAGNOSIS — G7000 Myasthenia gravis without (acute) exacerbation: Secondary | ICD-10-CM | POA: Diagnosis not present

## 2021-06-08 DIAGNOSIS — J449 Chronic obstructive pulmonary disease, unspecified: Secondary | ICD-10-CM | POA: Diagnosis not present

## 2021-06-08 DIAGNOSIS — I10 Essential (primary) hypertension: Secondary | ICD-10-CM | POA: Diagnosis not present

## 2021-06-08 DIAGNOSIS — R112 Nausea with vomiting, unspecified: Secondary | ICD-10-CM | POA: Diagnosis not present

## 2021-06-08 DIAGNOSIS — E785 Hyperlipidemia, unspecified: Secondary | ICD-10-CM | POA: Diagnosis not present

## 2021-06-08 DIAGNOSIS — Z8639 Personal history of other endocrine, nutritional and metabolic disease: Secondary | ICD-10-CM | POA: Diagnosis not present

## 2021-06-08 DIAGNOSIS — R6881 Early satiety: Secondary | ICD-10-CM

## 2021-06-08 DIAGNOSIS — E119 Type 2 diabetes mellitus without complications: Secondary | ICD-10-CM | POA: Diagnosis not present

## 2021-06-08 DIAGNOSIS — D692 Other nonthrombocytopenic purpura: Secondary | ICD-10-CM | POA: Diagnosis not present

## 2021-06-09 NOTE — Addendum Note (Signed)
Addended by: [name] on: 06/09/2021 08:49 PM   Modules accepted: Orders

## 2021-06-16 ENCOUNTER — Encounter: Payer: Self-pay | Admitting: Family Medicine

## 2021-06-16 DIAGNOSIS — M9979 Connective tissue and disc stenosis of intervertebral foramina of abdomen and other regions: Secondary | ICD-10-CM

## 2021-06-16 MED ORDER — HYDROCODONE-ACETAMINOPHEN 5-325 MG PO TABS: 1.0000 | ORAL_TABLET | Freq: Four times a day (QID) | ORAL | 0 refills | Status: DC | PRN

## 2021-06-28 ENCOUNTER — Other Ambulatory Visit: Payer: Self-pay

## 2021-06-28 ENCOUNTER — Encounter: Payer: Self-pay | Admitting: *Deleted

## 2021-06-28 ENCOUNTER — Encounter
Payer: No Typology Code available for payment source | Attending: Student in an Organized Health Care Education/Training Program | Admitting: *Deleted

## 2021-06-28 DIAGNOSIS — J9612 Chronic respiratory failure with hypercapnia: Secondary | ICD-10-CM

## 2021-06-28 NOTE — Progress Notes (Signed)
Step one of orientation completed today. he has an appointment on Date: 07/04/2021  for EP eval and gym Orientation.  Documentation of diagnosis can be found in [facility] rescords) Date: 07/04/2021 .

## 2021-07-04 ENCOUNTER — Encounter: Payer: No Typology Code available for payment source | Admitting: *Deleted

## 2021-07-04 ENCOUNTER — Other Ambulatory Visit: Payer: Self-pay

## 2021-07-04 VITALS — Ht 66.0 in | Wt 239.2 lb

## 2021-07-04 DIAGNOSIS — J9612 Chronic respiratory failure with hypercapnia: Secondary | ICD-10-CM | POA: Diagnosis present

## 2021-07-04 NOTE — Patient Instructions (Signed)
Patient Instructions  Patient Details  Name: Joseph Macdonald MRN: [id number] Date of Birth: January 09, 1946 Referring Provider:  Renee Harder, MD  Below are your personal goals for exercise, nutrition, and risk factors. Our goal is to help you stay on track towards obtaining and maintaining these goals. We will be discussing your progress on these goals with you throughout the program.  Initial Exercise Prescription:  Initial Exercise Prescription - 07/04/21 1100       Date of Initial Exercise RX and Referring Provider   Date 07/04/21    Referring Provider Wyvonna Plum ([place])      Oxygen   Oxygen Continuous    Liters 2      Treadmill   MPH 1    Grade 0    Minutes 15    METs 1.77      NuStep   Level 1    SPM 80    Minutes 15    METs 1.5      Biostep-RELP   Level 1    SPM 50    Minutes 15    METs 2      Track   Laps 10    Minutes 15    METs 1.54      Prescription Details   Frequency (times per week) 2    Duration Progress to 30 minutes of continuous aerobic without signs/symptoms of physical distress      Intensity   THRR 40-80% of Max Heartrate 106-132    Ratings of Perceived Exertion 11-13    Perceived Dyspnea 0-4      Progression   Progression Continue to progress workloads to maintain intensity without signs/symptoms of physical distress.      Resistance Training   Training Prescription Yes    Weight 4 lb    Reps 10-15             Exercise Goals: Frequency: Be able to perform aerobic exercise two to three times per week in program working toward 2-5 days per week of home exercise.  Intensity: Work with a perceived exertion of 11 (fairly light) - 15 (hard) while following your exercise prescription.  We will make changes to your prescription with you as you progress through the program.   Duration: Be able to do 30 to 45 minutes of continuous aerobic exercise in addition to a 5 minute warm-up and a 5 minute cool-down routine.   Nutrition Goals: Your  personal nutrition goals will be established when you do your nutrition analysis with the dietician.  The following are general nutrition guidelines to follow: Cholesterol < '200mg'$ /day Sodium < '1500mg'$ /day Fiber: Men over 50 yrs - 30 grams per day  Personal Goals:  Personal Goals and Risk Factors at Admission - 07/04/21 1144       Core Components/Risk Factors/Patient Goals on Admission    Weight Management Yes;Weight Loss;Obesity    Intervention Weight Management: Develop a combined nutrition and exercise program designed to reach desired caloric intake, while maintaining appropriate intake of nutrient and fiber, sodium and fats, and appropriate energy expenditure required for the weight goal.;Weight Management: Provide education and appropriate resources to help participant work on and attain dietary goals.;Weight Management/Obesity: Establish reasonable short term and long term weight goals.;Obesity: Provide education and appropriate resources to help participant work on and attain dietary goals.    Admit Weight 239 lb 3.2 oz (108.5 kg)    Goal Weight: Short Term 235 lb (106.6 kg)    Goal Weight: Long Term  189 lb (85.7 kg)    Expected Outcomes Short Term: Continue to assess and modify interventions until short term weight is achieved;Long Term: Adherence to nutrition and physical activity/exercise program aimed toward attainment of established weight goal;Weight Loss: Understanding of general recommendations for a balanced deficit meal plan, which promotes 1-2 lb weight loss per week and includes a negative energy balance of [phone] kcal/d;Understanding recommendations for meals to include 15-35% energy as protein, 25-35% energy from fat, 35-60% energy from carbohydrates, less than '200mg'$  of dietary cholesterol, 20-35 gm of total fiber daily;Understanding of distribution of calorie intake throughout the day with the consumption of 4-5 meals/snacks    Increase knowledge of respiratory medications  and ability to use respiratory devices properly  Yes    Intervention Provide education and demonstration as needed of appropriate use of medications, inhalers, and oxygen therapy.    Expected Outcomes Short Term: Achieves understanding of medications use. Understands that oxygen is a medication prescribed by physician. Demonstrates appropriate use of inhaler and oxygen therapy.;Long Term: Maintain appropriate use of medications, inhalers, and oxygen therapy.    Hypertension Yes    Intervention Provide education on lifestyle modifcations including regular physical activity/exercise, weight management, moderate sodium restriction and increased consumption of fresh fruit, vegetables, and low fat dairy, alcohol moderation, and smoking cessation.;Monitor prescription use compliance.    Expected Outcomes Short Term: Continued assessment and intervention until BP is < 140/57m HG in hypertensive participants. < 130/885mHG in hypertensive participants with diabetes, heart failure or chronic kidney disease.;Long Term: Maintenance of blood pressure at goal levels.    Lipids Yes    Intervention Provide education and support for participant on nutrition & aerobic/resistive exercise along with prescribed medications to achieve LDL '70mg'$ , HDL >'40mg'$ .    Expected Outcomes Short Term: Participant states understanding of desired cholesterol values and is compliant with medications prescribed. Participant is following exercise prescription and nutrition guidelines.;Long Term: Cholesterol controlled with medications as prescribed, with individualized exercise RX and with personalized nutrition plan. Value goals: LDL < '70mg'$ , HDL > 40 mg.             Tobacco Use Initial Evaluation: Social History   Tobacco Use  Smoking Status Former   Packs/day: 0.50   Years: 40.00   Pack years: 20.00   Types: Pipe, Cigarettes   Quit date: 08/19/2017   Years since quitting: 3.8  Smokeless Tobacco Former   Quit date: 08/19/2017   Tobacco Comments   previously smoked 1 1/2 pack since his [age]ears old  Quit 2018    Exercise Goals and Review:  Exercise Goals     Row Name 07/04/21 1140             Exercise Goals   Increase Physical Activity Yes       Intervention Provide advice, education, support and counseling about physical activity/exercise needs.;Develop an individualized exercise prescription for aerobic and resistive training based on initial evaluation findings, risk stratification, comorbidities and participant's personal goals.       Expected Outcomes Short Term: Attend rehab on a regular basis to increase amount of physical activity.;Long Term: Add in home exercise to make exercise part of routine and to increase amount of physical activity.;Long Term: Exercising regularly at least 3-5 days a week.       Increase Strength and Stamina Yes       Intervention Provide advice, education, support and counseling about physical activity/exercise needs.       Expected Outcomes Short Term: Increase workloads  from initial exercise prescription for resistance, speed, and METs.;Short Term: Perform resistance training exercises routinely during rehab and add in resistance training at home;Long Term: Improve cardiorespiratory fitness, muscular endurance and strength as measured by increased METs and functional capacity (6MWT)       Able to understand and use rate of perceived exertion (RPE) scale Yes       Intervention Provide education and explanation on how to use RPE scale       Expected Outcomes Short Term: Able to use RPE daily in rehab to express subjective intensity level;Long Term:  Able to use RPE to guide intensity level when exercising independently       Able to understand and use Dyspnea scale Yes       Intervention Provide education and explanation on how to use Dyspnea scale       Expected Outcomes Short Term: Able to use Dyspnea scale daily in rehab to express subjective sense of shortness of breath during  exertion;Long Term: Able to use Dyspnea scale to guide intensity level when exercising independently       Knowledge and understanding of Target Heart Rate Range (THRR) Yes       Intervention Provide education and explanation of THRR including how the numbers were predicted and where they are located for reference       Expected Outcomes Short Term: Able to state/look up THRR;Long Term: Able to use THRR to govern intensity when exercising independently;Short Term: Able to use daily as guideline for intensity in rehab       Able to check pulse independently Yes       Intervention Provide education and demonstration on how to check pulse in carotid and radial arteries.;Review the importance of being able to check your own pulse for safety during independent exercise       Expected Outcomes Short Term: Able to explain why pulse checking is important during independent exercise;Long Term: Able to check pulse independently and accurately       Understanding of Exercise Prescription Yes       Intervention Provide education, explanation, and written materials on patient's individual exercise prescription       Expected Outcomes Short Term: Able to explain program exercise prescription;Long Term: Able to explain home exercise prescription to exercise independently                Copy of goals given to participant.

## 2021-07-04 NOTE — Progress Notes (Signed)
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 continued self-education. Includes information about supplement oxygen, available portable oxygen systems, continuous and intermittent flow rates, oxygen safety, concentrators, and Medicare reimbursement for oxygen. Explanation of Pulmonary Drugs, including class, frequency, complications, importance of spacers, rinsing mouth after steroid MDI's, and proper cleaning methods for nebulizers. Review of basic lung anatomy and physiology related to function, structure, and complications of lung disease. Review of risk factors. Discussion about methods for diagnosing sleep apnea and types of masks and machines for OSA. Includes a review of the use of types of environmental controls: home humidity, furnaces, filters, dust mite/pet prevention, HEPA vacuums. Discussion about weather changes, air quality and the benefits of nasal washing. Instruction on  Warning signs, infection symptoms, calling MD promptly, preventive modes, and value of vaccinations. Review of effective  airway clearance, coughing and/or vibration techniques. Emphasizing that all should Create an Action Plan. Written material given at graduation. Flowsheet Row Pulmonary Rehab from [facility]04/[facility]  Education need identified 07/04/21       AED/CPR: - Group verbal and written instruction with the use of models to demonstrate the basic use of the AED with the basic ABC's of resuscitation.    Anatomy and Cardiac Procedures: - Group verbal and visual presentation and models provide information about basic cardiac anatomy and function. Reviews the testing methods done to diagnose heart disease and the outcomes of the test results. Describes the treatment choices: Medical Management, Angioplasty, or Coronary Bypass Surgery for treating various heart conditions including Myocardial Infarction, Angina, Valve Disease, and Cardiac Arrhythmias.  Written material given at graduation.   Medication Safety: - Group verbal and visual instruction to review commonly prescribed medications for heart and lung disease. Reviews the medication, class of the drug, and side effects. Includes the steps to properly store meds and maintain the prescription regimen.  Written material given at graduation.   Other: -Provides group and verbal instruction on various topics (see comments)   Knowledge Questionnaire Score:  Knowledge Questionnaire Score - 07/04/21 1143       Knowledge Questionnaire Score   Pre Score 15/18 exercies, O2 safety, resp meds              Core Components/Risk Factors/Patient Goals at Admission:  Personal Goals and Risk Factors at Admission - 07/04/21 1144       Core Components/Risk Factors/Patient Goals on Admission    Weight Management Yes;Weight Loss;Obesity    Intervention Weight Management: Develop a combined nutrition and exercise  program designed to reach desired caloric intake, while maintaining appropriate intake of nutrient and fiber, sodium and fats, and appropriate energy expenditure required for the weight goal.;Weight Management: Provide education and appropriate resources to help participant work on and attain dietary goals.;Weight Management/Obesity: Establish reasonable short term and long term weight goals.;Obesity: Provide education and appropriate resources to help participant work on and attain dietary goals.    Admit Weight 239 lb 3.2 oz (108.5 kg)    Goal Weight: Short Term 235 lb (106.6 kg)    Goal Weight: Long Term 189 lb (85.7 kg)    Expected Outcomes Short Term: Continue to assess and modify interventions until short term weight is achieved;Long Term: Adherence to nutrition and physical activity/exercise program aimed toward attainment of established weight goal;Weight Loss: Understanding of general recommendations for a balanced deficit meal plan, which promotes 1-2 lb weight loss per week and includes a negative energy balance of [phone] kcal/d;Understanding recommendations for meals to include 15-35% energy as protein, 25-35% energy from fat, 35-60% energy from carbohydrates, less than '200mg'$  of dietary cholesterol, 20-35 gm of total fiber daily;Understanding of distribution of calorie intake throughout the day with the consumption of 4-5 meals/snacks    Increase knowledge of respiratory medications and ability to use respiratory devices properly  Yes    Intervention Provide education and demonstration as needed of appropriate use of medications, inhalers, and oxygen therapy.    Expected Outcomes Short Term: Achieves understanding of medications use. Understands that oxygen is a medication prescribed by physician. Demonstrates appropriate use of inhaler and oxygen therapy.;Long Term: Maintain appropriate use of medications, inhalers, and oxygen therapy.    Hypertension Yes    Intervention Provide education on  lifestyle modifcations including regular physical activity/exercise, weight management, moderate sodium restriction and increased consumption of fresh  fruit, vegetables, and low fat dairy, alcohol moderation, and smoking cessation.;Monitor prescription use compliance.    Expected Outcomes Short Term: Continued assessment and intervention until BP is < 140/55m HG in hypertensive participants. < 130/885mHG in hypertensive participants with diabetes, heart failure or chronic kidney disease.;Long Term: Maintenance of blood pressure at goal levels.    Lipids Yes    Intervention Provide education and support for participant on nutrition & aerobic/resistive exercise along with prescribed medications to achieve LDL '70mg'$ , HDL >'40mg'$ .    Expected Outcomes Short Term: Participant states understanding of desired cholesterol values and is compliant with medications prescribed. Participant is following exercise prescription and nutrition guidelines.;Long Term: Cholesterol controlled with medications as prescribed, with individualized exercise RX and with personalized nutrition plan. Value goals: LDL < '70mg'$ , HDL > 40 mg.             Education:Diabetes - Individual verbal and written instruction to review signs/symptoms of diabetes, desired ranges of glucose level fasting, after meals and with exercise. Acknowledge that pre and post exercise glucose checks will be done for 3 sessions at entry of program. [facility]  Date 07/04/21  Educator [facility]ruction Review Code 1- Verbalizes Understanding       Know Your Numbers and Heart Failure: - Group verbal and visual instruction to discuss disease risk factors for cardiac and pulmonary disease and treatment options.  Reviews associated critical values for Overweight/Obesity, Hypertension, Cholesterol, and Diabetes.  Discusses basics of heart failure: signs/symptoms and treatments.  Introduces  Heart Failure Zone chart for action plan for heart failure.  Written material given at graduation.   Core Components/Risk Factors/Patient Goals Review:    Core Components/Risk Factors/Patient Goals at Discharge (Final Review):    ITP Comments:  ITP Comments     Row Name 06/28/21 1036 07/04/21 1133         ITP Comments Step one of orientation completed today. he has an appointment on Date: 07/04/2021  for EP eval and gym Orientation.  [facility]: 07/04/2021 . Completed 6MWT and gym orientation. Initial ITP created and sent for review to Dr. FaZetta BillsMedical Director.               Comments: Initial ITP

## 2021-07-07 ENCOUNTER — Other Ambulatory Visit: Payer: Self-pay

## 2021-07-07 DIAGNOSIS — J9612 Chronic respiratory failure with hypercapnia: Secondary | ICD-10-CM

## 2021-07-07 LAB — GLUCOSE, CAPILLARY
Glucose-Capillary: 117 mg/dL — ABNORMAL HIGH (ref 70–99)
Glucose-Capillary: 99 mg/dL (ref 70–99)

## 2021-07-07 NOTE — Progress Notes (Signed)
Daily Session Note  Patient Details  Name: Joseph Macdonald MRN: [id number] Date of Birth: 1946/04/20 Referring Provider:   Flowsheet Row Pulmonary Rehab from 07/04/2021 in [facility] Cardiac and Pulmonary Rehab  Referring Provider Wyvonna Plum ([place])       Encounter Date: 07/07/2021  Check In:  Session Check In - 07/07/21 0944       Check-In   Supervising physician immediately available to respond to emergencies See telemetry face sheet for immediately available ER MD    Location [facility]-Cardiac & Pulmonary Rehab    Staff Present [name], MPA, Mauricia Area, BS, ACSM CEP, Exercise Physiologist;Amanda Oletta Darter, BA, ACSM CEP, Exercise Physiologist    Virtual Visit No    Medication changes reported     No    Fall or balance concerns reported    No    Warm-up and Cool-down Performed on first and last piece of equipment    Resistance Training Performed Yes    VAD Patient? No    PAD/SET Patient? No      Pain Assessment   Currently in Pain? No/denies                Social History   Tobacco Use  Smoking Status Former   Packs/day: 0.50   Years: 40.00   Pack years: 20.00   Types: Pipe, Cigarettes   Quit date: 08/19/2017   Years since quitting: 3.8  Smokeless Tobacco Former   Quit date: 08/19/2017  Tobacco Comments   previously smoked 1 1/2 pack since his 75 years old  Quit 2018    Goals Met:  Independence with exercise equipment Exercise tolerated well No report of cardiac concerns or symptoms Strength training completed today  Goals Unmet:  Not Applicable  Comments: First full day of exercise!  Patient was oriented to gym and equipment including functions, settings, policies, and procedures.  Patient's individual exercise prescription and treatment plan were reviewed.  All starting workloads were established based on the results of the 6 minute walk test done at initial orientation visit.  The plan for exercise progression was also introduced and progression will be  customized based on patient's performance and goals.     Dr. Emily Filbert is Medical Director for [place].  Dr. Ottie Glazier is Medical Director for [facility] Pulmonary Rehabilitation.

## 2021-07-14 ENCOUNTER — Other Ambulatory Visit: Payer: Self-pay

## 2021-07-14 ENCOUNTER — Encounter
Payer: No Typology Code available for payment source | Attending: Student in an Organized Health Care Education/Training Program | Admitting: *Deleted

## 2021-07-14 DIAGNOSIS — J9612 Chronic respiratory failure with hypercapnia: Secondary | ICD-10-CM | POA: Diagnosis not present

## 2021-07-14 DIAGNOSIS — J9611 Chronic respiratory failure with hypoxia: Secondary | ICD-10-CM | POA: Insufficient documentation

## 2021-07-14 LAB — GLUCOSE, CAPILLARY: Glucose-Capillary: 131 mg/dL — ABNORMAL HIGH (ref 70–99)

## 2021-07-14 NOTE — Progress Notes (Signed)
Daily Session Note  Patient Details  Name: Joseph Macdonald MRN: [id number] Date of Birth: [birth year] Referring Provider:   Flowsheet Row Pulmonary Rehab from 07/04/2021 in [facility] Cardiac and Pulmonary Rehab  Referring Provider Wyvonna Plum ([place])       Encounter Date: 07/14/2021  Check In:  Session Check In - 07/14/21 1105       Check-In   Supervising physician immediately available to respond to emergencies See telemetry face sheet for immediately available ER MD    Location [facility]-Cardiac & Pulmonary Rehab    Staff Present Renita Papa, RN BSN;Carrington Lawrence, RCP,RRT,BSRT;Melissa [place], [place], Rowe Pavy, BA, ACSM CEP, Exercise Physiologist    Virtual Visit No    Medication changes reported     No    Fall or balance concerns reported    No    Warm-up and Cool-down Performed on first and last piece of equipment    Resistance Training Performed Yes    VAD Patient? No    PAD/SET Patient? No      Pain Assessment   Currently in Pain? No/denies                Social History   Tobacco Use  Smoking Status Former   Packs/day: 0.50   Years: 40.00   Pack years: 20.00   Types: Pipe, Cigarettes   Quit date: 08/19/2017   Years since quitting: 3.9  Smokeless Tobacco Former   Quit date: 08/19/2017  Tobacco Comments   previously smoked 1 1/2 pack since his 75 years old  Quit 2018    Goals Met:  Independence with exercise equipment Exercise tolerated well No report of cardiac concerns or symptoms Strength training completed today  Goals Unmet:  Not Applicable  Comments: Pt able to follow exercise prescription today without complaint.  Will continue to monitor for progression.    Dr. Emily Filbert is Medical Director for [place].  Dr. Ottie Glazier is Medical Director for [facility] Pulmonary Rehabilitation.

## 2021-07-18 ENCOUNTER — Encounter: Payer: Self-pay | Admitting: Family Medicine

## 2021-07-18 DIAGNOSIS — M9979 Connective tissue and disc stenosis of intervertebral foramina of abdomen and other regions: Secondary | ICD-10-CM

## 2021-07-19 ENCOUNTER — Other Ambulatory Visit: Payer: Self-pay

## 2021-07-19 DIAGNOSIS — J9611 Chronic respiratory failure with hypoxia: Secondary | ICD-10-CM | POA: Diagnosis not present

## 2021-07-19 DIAGNOSIS — M1711 Unilateral primary osteoarthritis, right knee: Secondary | ICD-10-CM | POA: Diagnosis not present

## 2021-07-19 DIAGNOSIS — J9612 Chronic respiratory failure with hypercapnia: Secondary | ICD-10-CM

## 2021-07-19 MED ORDER — HYDROCODONE-ACETAMINOPHEN 5-325 MG PO TABS: 1.0000 | ORAL_TABLET | Freq: Four times a day (QID) | ORAL | 0 refills | Status: DC | PRN

## 2021-07-19 NOTE — Progress Notes (Signed)
Daily Session Note  Patient Details  Name: Joseph Macdonald MRN: [id number] Date of Birth: 08-04-1946 Referring Provider:   Flowsheet Row Pulmonary Rehab from 07/04/2021 in [facility] Cardiac and Pulmonary Rehab  Referring Provider Wyvonna Plum ([place])       Encounter Date: 07/19/2021  Check In:  Session Check In - 07/19/21 0945       Check-In   Supervising physician immediately available to respond to emergencies See telemetry face sheet for immediately available ER MD    Location [facility]-Cardiac & Pulmonary Rehab    Staff Present [name], MPA, RN;Jessica Luan Pulling, MA, RCEP, CCRP, CCET;Amanda Sommer, BA, ACSM CEP, Exercise Physiologist    Virtual Visit No    Medication changes reported     No    Fall or balance concerns reported    No    Warm-up and Cool-down Performed on first and last piece of equipment    Resistance Training Performed Yes    VAD Patient? No    PAD/SET Patient? No      Pain Assessment   Currently in Pain? No/denies                Social History   Tobacco Use  Smoking Status Former   Packs/day: 0.50   Years: 40.00   Pack years: 20.00   Types: Pipe, Cigarettes   Quit date: 08/19/2017   Years since quitting: 3.9  Smokeless Tobacco Former   Quit date: 08/19/2017  Tobacco Comments   previously smoked 1 1/2 pack since his 75 years old  Quit 2018    Goals Met:  Independence with exercise equipment Exercise tolerated well No report of cardiac concerns or symptoms Strength training completed today  Goals Unmet:  Not Applicable  Comments: Pt able to follow exercise prescription today without complaint.  Will continue to monitor for progression.    Dr. Emily Filbert is Medical Director for [place].  Dr. Ottie Glazier is Medical Director for [facility] Pulmonary Rehabilitation.

## 2021-07-20 ENCOUNTER — Encounter: Payer: Self-pay | Admitting: *Deleted

## 2021-07-20 DIAGNOSIS — J9612 Chronic respiratory failure with hypercapnia: Secondary | ICD-10-CM

## 2021-07-20 NOTE — Progress Notes (Signed)
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 and depression.  Also discusses healthy ways to reduce stress and to treat/manage anxiety and depression.  Written material given at graduation.   Education: Sleep Hygiene -Provides group verbal and written instruction about how sleep can affect your health.  Define sleep hygiene, discuss sleep cycles and impact of sleep habits. Review good sleep hygiene tips.    Initial Review & Psychosocial Screening:  Initial Psych Review & Screening - 06/28/21 0928       Initial Review   Current issues with None Identified      Family Dynamics   Good Support System? Yes   wife of 68 years, 3 children, one here 2 other states     Barriers   Psychosocial barriers to participate in program There are no identifiable barriers or psychosocial needs.      Screening  Interventions   Interventions Encouraged to exercise    Expected Outcomes Short Term goal: Utilizing psychosocial counselor, staff and physician to assist with identification of specific Stressors or current issues interfering with healing process. Setting desired goal for each stressor or current issue identified.;Long Term Goal: Stressors or current issues are controlled or eliminated.;Short Term goal: Identification and review with participant of any Quality of Life or Depression concerns found by scoring the questionnaire.;Long Term goal: The participant improves quality of Life and PHQ9 Scores as seen by post scores and/or verbalization of changes             Quality of Life Scores:  Scores of 19 and below usually indicate a poorer quality of life in these areas.  A difference of  2-3 points is a clinically meaningful difference.  A difference of 2-3 points in the total score of the Quality of Life Index has been associated with significant improvement in overall quality of life, self-image, physical symptoms, and general health in studies assessing change in quality of life.  PHQ-9: Recent Review Flowsheet Data     Depression screen [facility] 2/9 07/04/2021 11/25/2020 10/28/2018 10/25/2017 08/28/2017   Decreased Interest 0 0 0 0 0   Down, Depressed, Hopeless 0 0 0 0 0   PHQ - 2 Score 0 0 0 0 0   Altered sleeping 0 - - 1 0   Tired, decreased energy 0 - - 1 0   Change in appetite 1 - - 0 0   Feeling bad or failure about yourself  0 - - 0 0   Trouble concentrating 0 - - 0 0   Moving slowly or fidgety/restless 0 - - 0 0   Suicidal thoughts 0 - - 0 0   PHQ-9 Score 1 - - 2 0   Difficult doing work/chores Not difficult at all - - Not difficult at all Not difficult at all      Interpretation of Total Score  Total Score Depression Severity:  1-4 = Minimal depression, 5-9 = Mild depression, 10-14 = Moderate depression, 15-19 = Moderately severe depression, 20-27 = Severe depression    Psychosocial Evaluation and Intervention:  Psychosocial Evaluation - 06/28/21 1041       Psychosocial Evaluation & Interventions   Interventions Encouraged to exercise with the program and follow exercise prescription    Comments Joe has no barriers to attending the program. He is a pulmonary patient with SOB that hinders his ability to manage day to day activities. He does have a rolling walker to help walking longer distances. He has a support team of his wife (47years) and  3 children. 2 children live out of state. 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 He has myasthenia gravis and this is possibly causing trouble with swallowed food moving to his stomach.  He has a gastro appt end of August. Joe has oxygen ordered for when he exerts himsel and at bedtime. He does not always use his oxygen when he should. He states he has no concerns with depression, stress nor anxiety. We will follow up for any changes.    Expected Outcomes STG: Joe will attend all scheduled sessoins and gain some relief with his SOB symptoms. He will wear his oxygen when exerting himself. LTG: Joe will see some progress with his symptoms decreasing, he will maintain the use of his oxygen as prescribed    Continue Psychosocial Services  Follow up required by staff             Psychosocial Re-Evaluation:   Psychosocial Discharge (Final Psychosocial Re-Evaluation):   Education: Education Goals: Education classes will be provided on a weekly basis, covering required topics. Participant will state understanding/return demonstration of topics presented.  Learning Barriers/Preferences:   General Pulmonary Education Topics:  Infection Prevention: - Provides verbal and written material to individual with discussion of infection control including proper hand washing and proper equipment cleaning during exercise session. Flowsheet Row Pulmonary Rehab  from 07/04/2021 in [facility] Cardiac and Pulmonary Rehab  Date 07/04/21  Educator [facility]  Instruction Review Code 1- Verbalizes Understanding       Falls Prevention: - Provides verbal and written material to individual with discussion of falls prevention and safety. Flowsheet Row Pulmonary Rehab from 07/04/2021 in [facility] Cardiac and Pulmonary Rehab  Date 06/28/21  Educator [facility]  Instruction Review Code 1- Verbalizes Understanding       Chronic Lung Disease Review: - Group verbal instruction with posters, models, PowerPoint presentations and videos,  to review new updates, new respiratory medications, new advancements in procedures and treatments. Providing information on websites and "800" numbers for continued self-education. Includes information about supplement oxygen, available portable oxygen systems, continuous and intermittent flow rates, oxygen safety, concentrators, and Medicare reimbursement for oxygen. Explanation of Pulmonary Drugs, including class, frequency, complications, importance of spacers, rinsing mouth after steroid MDI's, and proper cleaning methods for nebulizers. Review of basic lung anatomy and physiology related to function, structure, and complications of lung disease. Review of risk factors. Discussion about methods for diagnosing sleep apnea and types of masks and machines for OSA. Includes a review of the use of types of environmental controls: home humidity, furnaces, filters, dust mite/pet prevention, HEPA vacuums. Discussion about weather changes, air quality and the benefits of nasal washing. Instruction on Warning signs, infection symptoms, calling MD promptly, preventive modes, and value of vaccinations. Review of effective airway clearance, coughing and/or vibration techniques. Emphasizing that all should Create an Action Plan. Written material given at graduation. Flowsheet Row Pulmonary Rehab from 07/04/2021 in [facility] Cardiac and Pulmonary Rehab  Education need identified  07/04/21       AED/CPR: - Group verbal and written instruction with the use of models to demonstrate the basic use of the AED with the basic ABC's of resuscitation.    Anatomy and Cardiac Procedures: - Group verbal and visual presentation and models provide information about basic cardiac anatomy and function. Reviews the testing methods done to diagnose heart disease and the outcomes of the test results. Describes the treatment choices: Medical Management, Angioplasty, or Coronary  Bypass Surgery for treating various heart conditions including Myocardial Infarction, Angina, Valve Disease, and Cardiac Arrhythmias.  Written material given at graduation.   Medication Safety: - Group verbal and visual instruction to review commonly prescribed medications for heart and lung disease. Reviews the medication, class of the drug, and side effects. Includes the steps to properly store meds and maintain the prescription regimen.  Written material given at graduation.   Other: -Provides group and verbal instruction on various topics (see comments)   Knowledge Questionnaire Score:  Knowledge Questionnaire Score - 07/04/21 1143       Knowledge Questionnaire Score   Pre Score 15/18 exercies, O2 safety, resp meds              Core Components/Risk Factors/Patient Goals at Admission:  Personal Goals and Risk Factors at Admission - 07/04/21 1144       Core Components/Risk Factors/Patient Goals on Admission    Weight Management Yes;Weight Loss;Obesity    Intervention Weight Management: Develop a combined nutrition and exercise program designed to reach desired caloric intake, while maintaining appropriate intake of nutrient and fiber, sodium and fats, and appropriate energy expenditure required for the weight goal.;Weight Management: Provide education and appropriate resources to help participant work on and attain dietary goals.;Weight Management/Obesity: Establish reasonable short term and long  term weight goals.;Obesity: Provide education and appropriate resources to help participant work on and attain dietary goals.    Admit Weight 239 lb 3.2 oz (108.5 kg)    Goal Weight: Short Term 235 lb (106.6 kg)    Goal Weight: Long Term 189 lb (85.7 kg)    Expected Outcomes Short Term: Continue to assess and modify interventions until short term weight is achieved;Long Term: Adherence to nutrition and physical activity/exercise program aimed toward attainment of established weight goal;Weight Loss: Understanding of general recommendations for a balanced deficit meal plan, which promotes 1-2 lb weight loss per week and includes a negative energy balance of [phone] kcal/d;Understanding recommendations for meals to include 15-35% energy as protein, 25-35% energy from fat, 35-60% energy from carbohydrates, less than '200mg'$  of dietary cholesterol, 20-35 gm of total fiber daily;Understanding of distribution of calorie intake throughout the day with the consumption of 4-5 meals/snacks    Increase knowledge of respiratory medications and ability to use respiratory devices properly  Yes    Intervention Provide education and demonstration as needed of appropriate use of medications, inhalers, and oxygen therapy.    Expected Outcomes Short Term: Achieves understanding of medications use. Understands that oxygen is a medication prescribed by physician. Demonstrates appropriate use of inhaler and oxygen therapy.;Long Term: Maintain appropriate use of medications, inhalers, and oxygen therapy.    Hypertension Yes    Intervention Provide education on lifestyle modifcations including regular physical activity/exercise, weight management, moderate sodium restriction and increased consumption of fresh fruit, vegetables, and low fat dairy, alcohol moderation, and smoking cessation.;Monitor prescription use compliance.    Expected Outcomes Short Term: Continued assessment and intervention until BP is < 140/59m HG in  hypertensive participants. < 130/819mHG in hypertensive participants with diabetes, heart failure or chronic kidney disease.;Long Term: Maintenance of blood pressure at goal levels.    Lipids Yes    Intervention Provide education and support for participant on nutrition & aerobic/resistive exercise along with prescribed medications to achieve LDL '70mg'$ , HDL >'40mg'$ .    Expected Outcomes Short Term: Participant states understanding of desired cholesterol values and is compliant with medications prescribed. Participant is following exercise prescription and nutrition guidelines.;Long Term: Cholesterol  controlled with medications as prescribed, with individualized exercise RX and with personalized nutrition plan. Value goals: LDL < '70mg'$ , HDL > 40 mg.             Education:Diabetes - Individual verbal and written instruction to review signs/symptoms of diabetes, desired ranges of glucose level fasting, after meals and with exercise. Acknowledge that pre and post exercise glucose checks will be done for 3 sessions at entry of program. Flowsheet Row Pulmonary Rehab from 07/04/2021 in [facility] Cardiac and Pulmonary Rehab  Date 07/04/21  Educator [facility]  Instruction Review Code 1- Verbalizes Understanding       Know Your Numbers and Heart Failure: - Group verbal and visual instruction to discuss disease risk factors for cardiac and pulmonary disease and treatment options.  Reviews associated critical values for Overweight/Obesity, Hypertension, Cholesterol, and Diabetes.  Discusses basics of heart failure: signs/symptoms and treatments.  Introduces Heart Failure Zone chart for action plan for heart failure.  Written material given at graduation.   Core Components/Risk Factors/Patient Goals Review:    Core Components/Risk Factors/Patient Goals at Discharge (Final Review):    ITP Comments:  ITP Comments     Row Name 06/28/21 1036 07/04/21 1133 07/07/21 0945 07/20/21 0746     ITP Comments Step one of  orientation completed today. he has an appointment on Date: 07/04/2021  for EP eval and gym Orientation.  Documentation of diagnosis can be found in [facility] rescords) Date: 07/04/2021 . Completed 6MWT and gym orientation. Initial ITP created and sent for review to Dr. Zetta Bills, Medical Director. First full day of exercise!  Patient was oriented to gym and equipment including functions, settings, policies, and procedures.  Patient's individual exercise prescription and treatment plan were reviewed.  All starting workloads were established based on the results of the 6 minute walk test done at initial orientation visit.  The plan for exercise progression was also introduced and progression will be customized based on patient's performance and goals. 30 Day review completed. Medical Director ITP review done, changes made as directed, and signed approval by Medical Director.    New to program             Comments:

## 2021-07-21 ENCOUNTER — Ambulatory Visit: Payer: No Typology Code available for payment source | Admitting: Internal Medicine

## 2021-07-21 DIAGNOSIS — H40053 Ocular hypertension, bilateral: Secondary | ICD-10-CM | POA: Diagnosis not present

## 2021-07-24 ENCOUNTER — Emergency Department: Payer: No Typology Code available for payment source

## 2021-07-24 ENCOUNTER — Other Ambulatory Visit: Payer: Self-pay

## 2021-07-24 ENCOUNTER — Emergency Department
Admission: EM | Admit: 2021-07-24 | Discharge: 2021-07-24 | Disposition: A | Discharge status: Other Acute Inpatient Hospital | Payer: No Typology Code available for payment source | Attending: Emergency Medicine | Admitting: Emergency Medicine

## 2021-07-24 ENCOUNTER — Ambulatory Visit (HOSPITAL_COMMUNITY)
Admission: AD | Admit: 2021-07-24 | Discharge: 2021-07-24 | Disposition: A | Discharge status: Home / Self Care | Payer: No Typology Code available for payment source | Source: Other Acute Inpatient Hospital | Attending: Emergency Medicine | Admitting: Emergency Medicine

## 2021-07-24 DIAGNOSIS — Z20822 Contact with and (suspected) exposure to covid-19: Secondary | ICD-10-CM | POA: Diagnosis not present

## 2021-07-24 DIAGNOSIS — E1122 Type 2 diabetes mellitus with diabetic chronic kidney disease: Secondary | ICD-10-CM | POA: Insufficient documentation

## 2021-07-24 DIAGNOSIS — Z7951 Long term (current) use of inhaled steroids: Secondary | ICD-10-CM | POA: Insufficient documentation

## 2021-07-24 DIAGNOSIS — I130 Hypertensive heart and chronic kidney disease with heart failure and stage 1 through stage 4 chronic kidney disease, or unspecified chronic kidney disease: Secondary | ICD-10-CM | POA: Diagnosis not present

## 2021-07-24 DIAGNOSIS — N189 Chronic kidney disease, unspecified: Secondary | ICD-10-CM | POA: Diagnosis not present

## 2021-07-24 DIAGNOSIS — J441 Chronic obstructive pulmonary disease with (acute) exacerbation: Secondary | ICD-10-CM | POA: Insufficient documentation

## 2021-07-24 DIAGNOSIS — Z79899 Other long term (current) drug therapy: Secondary | ICD-10-CM | POA: Insufficient documentation

## 2021-07-24 DIAGNOSIS — I509 Heart failure, unspecified: Secondary | ICD-10-CM | POA: Diagnosis not present

## 2021-07-24 DIAGNOSIS — J9601 Acute respiratory failure with hypoxia: Secondary | ICD-10-CM | POA: Diagnosis not present

## 2021-07-24 DIAGNOSIS — Z87891 Personal history of nicotine dependence: Secondary | ICD-10-CM | POA: Diagnosis not present

## 2021-07-24 DIAGNOSIS — J9690 Respiratory failure, unspecified, unspecified whether with hypoxia or hypercapnia: Secondary | ICD-10-CM | POA: Insufficient documentation

## 2021-07-24 DIAGNOSIS — Z7901 Long term (current) use of anticoagulants: Secondary | ICD-10-CM | POA: Insufficient documentation

## 2021-07-24 DIAGNOSIS — R0602 Shortness of breath: Secondary | ICD-10-CM | POA: Diagnosis present

## 2021-07-24 LAB — LACTIC ACID, PLASMA: Lactic Acid, Venous: 1.4 mmol/L (ref 0.5–1.9)

## 2021-07-24 LAB — CBC WITH DIFFERENTIAL/PLATELET
Abs Immature Granulocytes: 0.06 10*3/uL (ref 0.00–0.07)
Basophils Absolute: 0 10*3/uL (ref 0.0–0.1)
Basophils Relative: 0 %
Eosinophils Absolute: 0 10*3/uL (ref 0.0–0.5)
Eosinophils Relative: 0 %
HCT: 51.3 % (ref 39.0–52.0)
Hemoglobin: 15.4 g/dL (ref 13.0–17.0)
Immature Granulocytes: 0 %
Lymphocytes Relative: 8 %
Lymphs Abs: 1 10*3/uL (ref 0.7–4.0)
MCH: 31.4 pg (ref 26.0–34.0)
MCHC: 30 g/dL (ref 30.0–36.0)
MCV: 104.5 fL — ABNORMAL HIGH (ref 80.0–100.0)
Monocytes Absolute: 0.9 10*3/uL (ref 0.1–1.0)
Monocytes Relative: 7 %
Neutro Abs: 11.5 10*3/uL — ABNORMAL HIGH (ref 1.7–7.7)
Neutrophils Relative %: 85 %
Platelets: 166 10*3/uL (ref 150–400)
RBC: 4.91 MIL/uL (ref 4.22–5.81)
RDW: 12.8 % (ref 11.5–15.5)
WBC: 13.5 10*3/uL — ABNORMAL HIGH (ref 4.0–10.5)
nRBC: 0 % (ref 0.0–0.2)

## 2021-07-24 LAB — BRAIN NATRIURETIC PEPTIDE: B Natriuretic Peptide: 484.7 pg/mL — ABNORMAL HIGH (ref 0.0–100.0)

## 2021-07-24 LAB — RESP PANEL BY RT-PCR (FLU A&B, COVID) ARPGX2
Influenza A by PCR: NEGATIVE
Influenza B by PCR: NEGATIVE
SARS Coronavirus 2 by RT PCR: NEGATIVE

## 2021-07-24 LAB — BASIC METABOLIC PANEL WITH GFR
Anion gap: 6 (ref 5–15)
BUN: 24 mg/dL — ABNORMAL HIGH (ref 8–23)
CO2: 41 mmol/L — ABNORMAL HIGH (ref 22–32)
Calcium: 8.1 mg/dL — ABNORMAL LOW (ref 8.9–10.3)
Chloride: 95 mmol/L — ABNORMAL LOW (ref 98–111)
Creatinine, Ser: 0.73 mg/dL (ref 0.61–1.24)
GFR, Estimated: >60 mL/min
Glucose, Bld: 141 mg/dL — ABNORMAL HIGH (ref 70–99)
Potassium: 4.9 mmol/L (ref 3.5–5.1)
Sodium: 142 mmol/L (ref 135–145)

## 2021-07-24 MED ORDER — SODIUM CHLORIDE 0.9 % IV SOLN
500.0000 mg | INTRAVENOUS | Status: DC
  Administered 2021-07-24: 500 mg via INTRAVENOUS
  Filled 2021-07-24: qty 500

## 2021-07-24 MED ORDER — ALBUTEROL SULFATE (2.5 MG/3ML) 0.083% IN NEBU
5.0000 mg | INHALATION_SOLUTION | Freq: Once | RESPIRATORY_TRACT | Status: AC
  Administered 2021-07-24: 5 mg via RESPIRATORY_TRACT
  Filled 2021-07-24: qty 6

## 2021-07-24 MED ORDER — CEFTRIAXONE SODIUM 2 G IJ SOLR
2.0000 g | INTRAMUSCULAR | Status: DC
Start: 2021-07-24 — End: 2021-07-25
  Administered 2021-07-24: 2 g via INTRAVENOUS
  Filled 2021-07-24: qty 20

## 2021-07-24 NOTE — ED Triage Notes (Signed)
[facility] for [facility] and abd pain- pt states it all started on Friday- pt was given 2 duonebs and 125 solumedrol en route- pt normally wears 1L [place] as needed but is currently on 4L- hx COPD- pt denies NVD

## 2021-07-24 NOTE — ED Notes (Signed)
[facility] to transport to [facility]

## 2021-07-24 NOTE — ED Notes (Signed)
Report called to Naaman Plummer at [facility].

## 2021-07-24 NOTE — ED Provider Notes (Signed)
[facility] Emergency Department Provider Note  ____________________________________________  Time seen: Approximately 5:55 PM  I have reviewed the triage vital signs and the nursing notes.   HISTORY  Chief Complaint Shortness of Breath and Abdominal Pain    HPI Joseph Macdonald is a 75 y.o. male with a past history of COPD diabetes hypertension heart failure and obesity who comes the ED complaining of shortness of breath and productive cough for the past 2 days.  Normally uses 1 L nasal cannula as needed at home, and EMS noted that his oxygen saturation was 73% on his 1 L nasal cannula.  They increased him to 4 L nasal cannula, gave 125 mg of Solu-Medrol, 2 duo nebs prior to arrival in the ED.  Patient denies chest pain.  Shortness of breath is gradually worsening, constant, no aggravating or alleviating factors.    Past Medical History:  Diagnosis Date  . Arthritis   . COPD (chronic obstructive pulmonary disease) ([place])   . Diabetes mellitus without complication (HCC)    diet controlled  . Dyspnea    with activity  . Hyperlipidemia   . Hypertension   . Myasthenia gravis ([place])   . Oxygen deficit   . Ventricular tachycardia [facility])      Patient Active Problem List   Diagnosis Date Noted  . History of pulmonary embolism 07/02/2020  . Vitamin D deficiency 06/17/2019  . Acute on chronic kidney failure ([place]) 06/11/2019  . Shortness of breath 04/11/2019  . Epigastric pain 04/11/2019  . Hypokalemia 04/11/2019  . Ventricular tachycardia ([place]) 03/12/2019  . Chronic heart failure with preserved ejection fraction (HFpEF) ([place]) 03/12/2019  . Chest pain 03/12/2019  . COPD (chronic obstructive pulmonary disease) ([place]) 01/13/2019  . Pre-diabetes 03/19/2017  . Chronic narcotic use 01/17/2016  . Myasthenia gravis ([place]) 09/17/2015  . Anxiety 08/17/2015  . 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 Clinical depression  08/17/2015  . Acid reflux 08/17/2015  . Hemorrhoid 08/17/2015  . Essential hypertension 08/17/2015  . Bad memory 08/17/2015  . 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 Allergic rhinitis, seasonal 08/17/2015  . Smoking greater than 30 pack years 08/17/2015  . Restless leg syndrome 08/17/2015     Past Surgical History:  Procedure Laterality Date  . carpel tunnel sx    . CATARACT EXTRACTION     right eye  . CATARACT EXTRACTION W/PHACO Left 12/01/2020   Procedure: CATARACT EXTRACTION PHACO AND INTRAOCULAR LENS PLACEMENT (IOC) LEFT 10.83 01:14.1 14.6%;  Surgeon: Leandrew Koyanagi, MD;  Location: [place];  Service: Ophthalmology;  Laterality: Left;  . RETINAL DETACHMENT SURGERY    . SKIN GRAFT     Behind left knee  . TONSILLECTOMY AND ADENOIDECTOMY       Prior to Admission medications   Medication Sig Start Date End Date Taking? Authorizing Provider  albuterol (PROVENTIL HFA;VENTOLIN HFA) 108 (90 Base) MCG/ACT inhaler Inhale into the lungs every 6 (six) hours as needed for wheezing or shortness of breath.    [provider]  apixaban (ELIQUIS) 2.5 MG TABS tablet Take 2.5 mg by mouth 2 (two) times daily.     [provider]  budesonide-formoterol (SYMBICORT) 160-4.5 MCG/ACT inhaler Inhale 2 puffs into the lungs 2 (two) times daily. 06/16/19   [name], MD  cholecalciferol (VITAMIN D) 25 MCG (1000 UNIT) tablet Take 2,000 Units by mouth daily.    [provider]  famotidine (PEPCID) 20 MG tablet Take 20 mg by  mouth as needed for heartburn or indigestion.    [provider]  flecainide (TAMBOCOR) 50 MG tablet Take 50 mg by mouth 2 (two) times daily.    [provider]  HYDROcodone-acetaminophen (NORCO/VICODIN) 5-325 MG tablet Take 1 tablet by mouth every 6 (six) hours as needed for moderate pain. 07/19/21   [name], MD  losartan (COZAAR) 25 MG tablet Take 1 tablet (25 mg total) by mouth daily. 11/18/20 02/16/21  Deboraha Sprang, MD  omeprazole (PRILOSEC) 40 MG capsule Take 40 mg by mouth 2 (two) times daily.     [provider]  pramipexole (MIRAPEX) 0.5 MG tablet Take 1 tablet (0.5 mg total) by mouth at bedtime. 06/04/21   [name], MD  pravastatin (PRAVACHOL) 20 MG tablet Take 20 mg by mouth daily.    [provider]  predniSONE (DELTASONE) 20 MG tablet Take 15 mg by mouth daily with breakfast.    [provider]  pyridostigmine (MESTINON) 60 MG tablet Take 60 mg by mouth 3 (three) times daily.     [provider]  terbinafine (LAMISIL) 250 MG tablet Take 250 mg by mouth daily. 06/03/21   [provider]  tiotropium (SPIRIVA) 18 MCG inhalation capsule Place 18 mcg into inhaler and inhale daily.    [provider]  torsemide (DEMADEX) 20 MG tablet Take 1- 2 tablets (20 mg- 40 mg) by mouth once EVERY OTHER day as directed 11/18/20   Deboraha Sprang, MD  vitamin B-12 (CYANOCOBALAMIN) 1000 MCG tablet Take 1,000 mcg by mouth daily.    [provider]     Allergies Patient has no known allergies.   Family History  Problem Relation Age of Onset  . Hypertension Mother   . Thyroid disease Mother   . Kidney disease Mother   . Arthritis Mother   . Stroke Father   . Heart disease Father 66  . Arthritis Father   . Heart attack Father   . Arthritis Other     Social History Social History   Tobacco Use  . Smoking status: Former    Packs/day: 0.50    Years: 40.00    Pack years: 20.00    Types: Pipe, Cigarettes    Quit date: 08/19/2017    Years since quitting: 3.9  . Smokeless tobacco: Former    Quit date: 08/19/2017  . Tobacco comments:    previously smoked 1 1/2 pack since his 75 years old  Quit 2018  Vaping Use  . Vaping Use: Former  . Substances: Nicotine, Flavoring  Substance Use Topics  . Alcohol use: Yes    Comment: occasionally- monthly or less  . Drug use: No    Review of Systems  Constitutional:   No fever or chills.  ENT:    No sore throat. No rhinorrhea. Cardiovascular:   No chest pain or syncope. Respiratory:   Positive shortness of breath and  cough. Gastrointestinal:   Negative for abdominal pain, vomiting and diarrhea.  Musculoskeletal:   Negative for focal pain or swelling All other systems reviewed and are negative except as documented above in ROS and HPI.  ____________________________________________   PHYSICAL EXAM:  VITAL SIGNS: ED Triage Vitals  Enc Vitals Group     BP 07/24/21 1727 (!) 157/68     Pulse Rate 07/24/21 1727 80     Resp 07/24/21 1727 (!) 22     Temp 07/24/21 1727 99 F (37.2 C)     Temp Source 07/24/21  1727 Oral     SpO2 07/24/21 1727 91 %     Weight 07/24/21 1728 232 lb (105.2 kg)     Height 07/24/21 1728 '5\' 7"'$  (1.702 m)     Head Circumference --      Peak Flow --      Pain Score 07/24/21 1728 8     Pain Loc --      Pain Edu? --      Excl. in [place]? --     Vital signs reviewed, nursing assessments reviewed.   Constitutional:   Alert and oriented. Non-toxic appearance. Eyes:   Conjunctivae are normal. EOMI. PERRL. ENT      Head:   Normocephalic and atraumatic.      Nose:   Normal      Mouth/Throat:   Moist mucosa.      Neck:   No meningismus. Full ROM. Hematological/Lymphatic/Immunilogical:   No cervical lymphadenopathy. Cardiovascular:   RRR. Symmetric bilateral radial and DP pulses.  No murmurs. Cap refill less than 2 seconds. Respiratory:   Tachypnea.  Bilateral inspiratory crackles, diminished breath sounds, expiratory wheezing. Gastrointestinal:   Soft and nontender. Non distended. There is no CVA tenderness.  No rebound, rigidity, or guarding. Genitourinary:   deferred Musculoskeletal:   Normal range of motion in all extremities. No joint effusions.  No lower extremity tenderness.  Trace pitting edema bilateral lower extremities, chronic per patient. Neurologic:   Normal speech and language.  Motor grossly intact. No acute focal neurologic deficits are  appreciated.  Skin:    Skin is warm, dry and intact. No rash noted.  No petechiae, purpura, or bullae.  ____________________________________________    LABS (pertinent positives/negatives) (all labs ordered are listed, but only abnormal results are displayed) Labs Reviewed  RESP PANEL BY RT-PCR (FLU A&B, COVID) ARPGX2  CULTURE, BLOOD (ROUTINE X 2)  CULTURE, BLOOD (ROUTINE X 2)  BASIC METABOLIC PANEL  BRAIN NATRIURETIC PEPTIDE  CBC WITH DIFFERENTIAL/PLATELET  LACTIC ACID, PLASMA  LACTIC ACID, PLASMA   ____________________________________________   EKG  Interpreted by me Sinus rhythm rate of 63, left axis, normal intervals.  Poor R wave progression.  Normal ST segments and T waves.  No acute ischemic changes.  ____________________________________________    RADIOLOGY  No results found.  ____________________________________________   PROCEDURES .Critical Care  Date/Time: 07/24/2021 5:55 PM Performed by: Carrie Mew, MD Authorized by: Carrie Mew, MD   Critical care provider statement:    Critical care time (minutes):  35   Critical care time was exclusive of:  Separately billable procedures and treating other patients   Critical care was necessary to treat or prevent imminent or life-threatening deterioration of the following conditions:  Respiratory failure   Critical care was time spent personally by me on the following activities:  Development of treatment plan with patient or surrogate, discussions with consultants, evaluation of patient's response to treatment, examination of patient, obtaining history from patient or surrogate, ordering and performing treatments and interventions, ordering and review of laboratory studies, ordering and review of radiographic studies, pulse oximetry, re-evaluation of patient's condition and review of old charts  ____________________________________________  DIFFERENTIAL DIAGNOSIS   Pneumonia, pulmonary edema, pleural  effusion, COPD exacerbation, COVID  CLINICAL IMPRESSION / ASSESSMENT AND PLAN / ED COURSE  Medications ordered in the ED: Medications  cefTRIAXone (ROCEPHIN) 2 g in sodium chloride 0.9 % 100 mL IVPB (2 g Intravenous New Bag/Given 07/24/21 1752)  azithromycin (ZITHROMAX) 500 mg in sodium chloride 0.9 % 250 mL IVPB (500  mg Intravenous New Bag/Given 07/24/21 1755)    Pertinent labs & imaging results that were available during my care of the patient were reviewed by me and considered in my medical decision making (see chart for details).  Branch Pelly Douthitt was evaluated in Emergency Department on 07/24/2021 for the symptoms described in the history of present illness. He was evaluated in the context of the global COVID-19 pandemic, which necessitated consideration that the patient might be at risk for infection with the SARS-CoV-2 virus that causes COVID-19. Institutional protocols and algorithms that pertain to the evaluation of patients at risk for COVID-19 are in a state of rapid change based on information released by regulatory bodies including the CDC and federal and state organizations. These policies and algorithms were followed during the patient's care in the ED.     Clinical Course as of 07/24/21 1952  Nancy Fetter Jul 24, 2021  1752 Patient presents with acute on chronic hypoxic respiratory failure, wheezing.  Chest x-ray viewed and interpreted by me which is concerning for multifocal pneumonia.  Rocephin and azithromycin ordered, stabilized on 4 L nasal cannula.  We will need to admit. [PS]  1753 Secretary reached out to [facility] bed control who confirms as of 5:30 PM today they do not have any bed availability and cannot accept the transfer.  We will need to admit the patient to [facility]. [PS]  [address] [facility] MOD Dr. Antonietta Jewel called to discuss the patient's care. They have an available bed for this patient and They have accepted for transfer to [facility] ED. [facility] to transport. [PS]    Clinical  Course User Index [PS] Carrie Mew, MD     ____________________________________________   FINAL CLINICAL IMPRESSION(S) / ED DIAGNOSES    Final diagnoses:  Acute respiratory failure with hypoxia ([place])  COPD exacerbation [facility]     ED Discharge Orders     None       Portions of this note were generated with dragon dictation software. Dictation errors may occur despite best attempts at proofreading.    Carrie Mew, MD 07/24/21 1759

## 2021-07-24 NOTE — ED Notes (Signed)
EMTALA Reviewed by this RN.  

## 2021-07-25 LAB — BLOOD CULTURE ID PANEL (REFLEXED) - BCID2

## 2021-07-26 NOTE — ED Notes (Signed)
8/13--received blood culture critical result.  [facility] to [facility] to get information to [facility] provider there, but was unable to get through.  Karsten Fells faxed the culture to the [facility] on call fax.   I did receive a call back from them to tell me they are aware of the culture result and have reviewed it.

## 2021-07-26 NOTE — Progress Notes (Signed)
PHARMACY - PHYSICIAN COMMUNICATION CRITICAL VALUE ALERT - BLOOD CULTURE IDENTIFICATION (BCID)  Joseph Macdonald is an 75 y.o. male who presented to [facility] on 07/24/2021 with a chief complaint of SOB and abdominal pain.  Patient transferred to [facility] medical [facility] in [place]: Patient transferred to [facility] from [facility] ED, Lab called pharmacy with positive cx (we asked lab to call ED with result, but no documentation so say this was completed).  I [facility] and spoke to nursing unit who said I could not send a fax with results.  I was given pager number for blue team #2 but no return call.  Blood culture with GPC in 1 of 2 sets (both bottles on the one set), BCID = [id number]  Name of physician (or Provider) Contacted: Attempted to contact blue team #2 at [facility] [phone]  Current antibiotics: n/a  Changes to prescribed antibiotics recommended:  n/a  Results for orders placed or performed during the hospital encounter of 07/24/21  Blood Culture ID Panel (Reflexed) (Collected: 07/24/2021  5:31 PM)  Result Value Ref Range   Enterococcus faecalis NOT DETECTED NOT DETECTED   Enterococcus Faecium NOT DETECTED NOT DETECTED   Listeria monocytogenes NOT DETECTED NOT DETECTED   Staphylococcus species DETECTED (A) NOT DETECTED   Staphylococcus aureus (BCID) NOT DETECTED NOT DETECTED   Staphylococcus epidermidis DETECTED (A) NOT DETECTED   Staphylococcus lugdunensis NOT DETECTED NOT DETECTED   Streptococcus species NOT DETECTED NOT DETECTED   Streptococcus agalactiae NOT DETECTED NOT DETECTED   Streptococcus pneumoniae NOT DETECTED NOT DETECTED   Streptococcus pyogenes NOT DETECTED NOT DETECTED   A.calcoaceticus-baumannii NOT DETECTED NOT DETECTED   Bacteroides fragilis NOT DETECTED NOT DETECTED   Enterobacterales NOT DETECTED NOT DETECTED   Enterobacter cloacae complex NOT DETECTED NOT DETECTED   Escherichia coli NOT DETECTED NOT DETECTED   Klebsiella aerogenes NOT DETECTED NOT  DETECTED   Klebsiella oxytoca NOT DETECTED NOT DETECTED   Klebsiella pneumoniae NOT DETECTED NOT DETECTED   Proteus species NOT DETECTED NOT DETECTED   Salmonella species NOT DETECTED NOT DETECTED   Serratia marcescens NOT DETECTED NOT DETECTED   Haemophilus influenzae NOT DETECTED NOT DETECTED   Neisseria meningitidis NOT DETECTED NOT DETECTED   Pseudomonas aeruginosa NOT DETECTED NOT DETECTED   Stenotrophomonas maltophilia NOT DETECTED NOT DETECTED   Candida albicans NOT DETECTED NOT DETECTED   Candida auris NOT DETECTED NOT DETECTED   Candida glabrata NOT DETECTED NOT DETECTED   Candida krusei NOT DETECTED NOT DETECTED   Candida parapsilosis NOT DETECTED NOT DETECTED   Candida tropicalis NOT DETECTED NOT DETECTED   Cryptococcus neoformans/gattii NOT DETECTED NOT DETECTED   Methicillin resistance mecA/C NOT DETECTED NOT DETECTED    Doreene Eland, PharmD, BCPS.   Work Cell: [phone] 07/26/2021 10:45 AM

## 2021-07-27 LAB — CULTURE, BLOOD (ROUTINE X 2): Special Requests: ADEQUATE

## 2021-07-29 LAB — CULTURE, BLOOD (ROUTINE X 2)
Culture: NO GROWTH
Special Requests: ADEQUATE

## 2021-08-02 ENCOUNTER — Encounter: Payer: Self-pay | Admitting: *Deleted

## 2021-08-02 ENCOUNTER — Telehealth: Payer: Self-pay | Admitting: *Deleted

## 2021-08-02 DIAGNOSIS — J9612 Chronic respiratory failure with hypercapnia: Secondary | ICD-10-CM

## 2021-08-02 NOTE — Telephone Encounter (Signed)
Amahd called to let us know he has been discharged from hospital. He was admitted for pneumonia. His discharge was to not exert himself. He was advised to stay out the rest of this week.  Tried to find admission note, not available as he was transferred to the [facility]

## 2021-08-08 ENCOUNTER — Telehealth: Payer: Self-pay

## 2021-08-08 NOTE — Telephone Encounter (Signed)
Patient called, is due back to rehab tomorrow. He was recently in the hospital at the [facility] and we requested he get clearance before he comes back. Patient will call us if anything changes.

## 2021-08-10 ENCOUNTER — Encounter: Payer: Self-pay | Admitting: Gastroenterology

## 2021-08-10 ENCOUNTER — Ambulatory Visit (INDEPENDENT_AMBULATORY_CARE_PROVIDER_SITE_OTHER): Payer: No Typology Code available for payment source | Admitting: Gastroenterology

## 2021-08-10 ENCOUNTER — Other Ambulatory Visit: Payer: Self-pay

## 2021-08-10 VITALS — BP 165/76 | HR 74 | Temp 97.4°F | Ht 67.0 in | Wt 232.8 lb

## 2021-08-10 DIAGNOSIS — R1110 Vomiting, unspecified: Secondary | ICD-10-CM | POA: Diagnosis not present

## 2021-08-10 NOTE — Progress Notes (Signed)
Gastroenterology Consultation  Referring Provider:     [name], MD Primary Care Physician:  [name], MD Primary Gastroenterologist:  Dr. Allen Norris     Reason for Consultation:     Rumination        HPI:   Joseph Macdonald is a 75 y.o. y/o male referred for consultation & management of Rumination by Dr. Caryn Section, Kirstie Peri, MD.  This patient comes in today with a report of being seen at the [facility] for symptoms of feeling full after he eats and food coming up in his mouth where he re-chews and swallows it back down.  The patient reports that he has a history of myasthenia gravis and had a barium study of his swallowing and also had an upper endoscopy with a gastric emptying study.  The patient does not have any of those results and they were done at the [place].  Due to him not getting answers reports going on the patient decided to come see me.  I have taken care of his wife in the past.  The patient denies any unexplained weight loss fevers chills nausea or vomiting.  The patient has regular colonoscopies due to a history of polyps and is due for repeat colonoscopy in December at the [place].  There is no report of any black stools but he does report that he has stools that are on the looser side.  He also reports that when he eats after a while he has trouble chewing and swallowing the food attributed to his myasthenia gravis.  Past Medical History:  Diagnosis Date   Arthritis    COPD (chronic obstructive pulmonary disease) ([place])    Diabetes mellitus without complication (HCC)    diet controlled   Dyspnea    with activity   Hyperlipidemia    Hypertension    Myasthenia gravis ([place])    Oxygen deficit    Ventricular tachycardia [facility])     Past Surgical History:  Procedure Laterality Date   carpel tunnel sx     CATARACT EXTRACTION     right eye   CATARACT EXTRACTION W/PHACO Left 12/01/2020   Procedure: CATARACT EXTRACTION PHACO AND INTRAOCULAR LENS PLACEMENT (IOC) LEFT 10.83 01:14.1  14.6%;  Surgeon: Leandrew Koyanagi, MD;  Location: [place];  Service: Ophthalmology;  Laterality: Left;   RETINAL DETACHMENT SURGERY     SKIN GRAFT     Behind left knee   TONSILLECTOMY AND ADENOIDECTOMY      Prior to Admission medications   Medication Sig Start Date End Date Taking? Authorizing Provider  albuterol (PROVENTIL HFA;VENTOLIN HFA) 108 (90 Base) MCG/ACT inhaler Inhale into the lungs every 6 (six) hours as needed for wheezing or shortness of breath.   Yes [provider]  apixaban (ELIQUIS) 2.5 MG TABS tablet Take 2.5 mg by mouth 2 (two) times daily.    Yes [provider]  budesonide-formoterol (SYMBICORT) 160-4.5 MCG/ACT inhaler Inhale 2 puffs into the lungs 2 (two) times daily. 06/16/19  Yes [name], MD  cholecalciferol (VITAMIN D) 25 MCG (1000 UNIT) tablet Take 2,000 Units by mouth daily.   Yes [provider]  famotidine (PEPCID) 20 MG tablet Take 20 mg by mouth as needed for heartburn or indigestion.   Yes [provider]  flecainide (TAMBOCOR) 50 MG tablet Take 50 mg by mouth 2 (two) times daily.   Yes [provider]  HYDROcodone-acetaminophen (NORCO/VICODIN) 5-325 MG tablet Take 1 tablet by mouth every 6 (six)  hours as needed for moderate pain. 07/19/21  Yes [name], MD  omeprazole (PRILOSEC) 40 MG capsule Take 40 mg by mouth 2 (two) times daily.    Yes [provider]  pramipexole (MIRAPEX) 0.5 MG tablet Take 1 tablet (0.5 mg total) by mouth at bedtime. 06/04/21  Yes [name], MD  pravastatin (PRAVACHOL) 20 MG tablet Take 20 mg by mouth daily.   Yes [provider]  predniSONE (DELTASONE) 20 MG tablet Take 15 mg by mouth daily with breakfast.   Yes [provider]  pyridostigmine (MESTINON) 60 MG tablet Take 60 mg by mouth 3 (three) times daily.    Yes [provider]  terbinafine (LAMISIL) 250 MG tablet Take 250 mg by mouth daily. 06/03/21  Yes [provider]  tiotropium (SPIRIVA) 18 MCG inhalation capsule Place 18 mcg into inhaler and inhale daily.   Yes [provider]  torsemide (DEMADEX) 20 MG tablet Take 1- 2 tablets (20 mg- 40 mg) by mouth once EVERY OTHER day as directed 11/18/20  Yes Deboraha Sprang, MD  vitamin B-12 (CYANOCOBALAMIN) 1000 MCG tablet Take 1,000 mcg by mouth daily.   Yes [provider]  losartan (COZAAR) 25 MG tablet Take 1 tablet (25 mg total) by mouth daily. 11/18/20 02/16/21  Deboraha Sprang, MD    Family History  Problem Relation Age of Onset   Hypertension Mother    Thyroid disease Mother    Kidney disease Mother    Arthritis Mother    Stroke Father    Heart disease Father 19   Arthritis Father    Heart attack Father    Arthritis Other      Social History   Tobacco Use   Smoking status: Former    Packs/day: 0.50    Years: 40.00    Pack years: 20.00    Types: Pipe, Cigarettes    Quit date: 08/19/2017    Years since quitting: 3.9   Smokeless tobacco: Former    Quit date: 08/19/2017   Tobacco comments:    previously smoked 1 1/2 pack since his 75 years old  Quit 2018  Vaping Use   Vaping Use: Former   Substances: Nicotine, Flavoring  Substance Use Topics   Alcohol use: Yes    Comment: occasionally- monthly or less   Drug use: No    Allergies as of 08/10/2021   (No Known Allergies)    Review of Systems:    All systems reviewed and negative except where noted in HPI.   Physical Exam:  BP (!) 165/76 (BP Location: Left Arm, Patient Position: Sitting, Cuff Size: Large)   Pulse 74   Temp (!) 97.4 F (36.3 C) (Temporal)   Ht '5\' 7"'$  (1.702 m)   Wt 232 lb 12.8 oz (105.6 kg)   BMI 36.46 kg/m  No LMP for male patient. General:   Alert,  Well-developed, well-nourished, pleasant and cooperative in NAD Head:  Normocephalic and atraumatic. Eyes:  Sclera clear, no icterus.   Conjunctiva pink. Ears:  Normal auditory acuity. Neck:  Supple; no masses or thyromegaly. Lungs:   Respirations even and unlabored.  Clear throughout to auscultation.   No wheezes, crackles, or rhonchi. No acute distress. Heart:  Regular rate and rhythm; no murmurs, clicks, rubs, or gallops. Abdomen:  Normal bowel sounds.  No bruits.  Soft, non-tender and non-distended without masses, hepatosplenomegaly or hernias noted.  No guarding or rebound tenderness.  Negative Carnett sign.   Rectal:  Deferred.  Pulses:  Normal pulses noted. Extremities:  No clubbing or edema.  No cyanosis. Neurologic:  Alert and oriented x3;  grossly normal neurologically. Skin:  Intact without significant lesions or rashes.  No jaundice. Lymph Nodes:  No significant cervical adenopathy. Psych:  Alert and cooperative. Normal mood and affect.  Imaging Studies: DG Chest Portable 1 View  Result Date: 07/24/2021 CLINICAL DATA:  Cough, shortness of breath, hypoxia. EXAM: PORTABLE CHEST 1 VIEW COMPARISON:  Chest x-ray dated 06/10/2019. FINDINGS: Patchy airspace opacities bilaterally, LEFT greater than RIGHT. No pleural effusion or pneumothorax is seen. Stable cardiomegaly. IMPRESSION: 1. Patchy bilateral airspace opacities, LEFT greater than RIGHT. This could represent multifocal pneumonia or CHF with pulmonary edema. 2. Stable cardiomegaly. Electronically Signed   By: Franki Cabot M.D.   On: 07/24/2021 18:03    Assessment and Plan:   Joseph Macdonald is a 75 y.o. y/o male who comes in today with signs and symptoms of rumination. He reports that a workup at the [place] including a gastric emptying study and an upper endoscopy with swallowing evaluations all were normal except for some things related to his myasthenia gravis.  He does not know exactly what was Barium swallow showed but that there was something they said they wanted to keep an eye on.  The patient now has this regurgitation with rumination despite his normal workup.  The patient has been told that this is part of functional bowel disorders and that he should  practice diaphragmatic breathing which has helped postprandially to alleviate symptoms of rumination.  The patient has been given detailed Instructions on how to perform diaphragmatic breathing and that he should do it for 10-15 minutes after eating or until his symptoms stop. The patient and his wife have been explained the plan and agree with it.    Lucilla Lame, MD. Marval Regal    Note: This dictation was prepared with Dragon dictation along with smaller phrase technology. Any transcriptional errors that result from this process are unintentional.

## 2021-08-11 ENCOUNTER — Telehealth: Payer: Self-pay

## 2021-08-11 ENCOUNTER — Encounter
Payer: No Typology Code available for payment source | Attending: Student in an Organized Health Care Education/Training Program

## 2021-08-11 DIAGNOSIS — J9612 Chronic respiratory failure with hypercapnia: Secondary | ICD-10-CM | POA: Insufficient documentation

## 2021-08-11 NOTE — Telephone Encounter (Signed)
Called Joseph Macdonald to let him know we received his clearance to return to rehab, he will be back Tuesdsay.

## 2021-08-16 ENCOUNTER — Other Ambulatory Visit: Payer: Self-pay

## 2021-08-16 ENCOUNTER — Telehealth: Payer: Self-pay | Admitting: Internal Medicine

## 2021-08-16 DIAGNOSIS — I422 Other hypertrophic cardiomyopathy: Secondary | ICD-10-CM

## 2021-08-16 DIAGNOSIS — J9612 Chronic respiratory failure with hypercapnia: Secondary | ICD-10-CM | POA: Diagnosis not present

## 2021-08-16 NOTE — Telephone Encounter (Signed)
[facility] box.  Pulled for Dr. Caryl Comes to review.    Per Dr. Olin Pia 04/28/21 office note: Reviewing the patient's scanned report from his cMRI, there are discordant comments.  The narrative describes a septum of 3.2 cm thickness with a posterior wall of 1.1.  The summary describes impression normal chambers.  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  Antihypertensives will be deferred lightening would be using a calcium blocker versus losartan, risk stratification for sudden death, family screening etc.  I have asked him to go back to the [place] and ask for a reread and a clarification of the report and/or to send [place] a disc and we would be glad to have our cardiac imagers look at it.

## 2021-08-16 NOTE — Telephone Encounter (Signed)
Patient spouse dropped off disc with MRI results Placed in nurse box

## 2021-08-16 NOTE — Progress Notes (Signed)
Daily Session Note  Patient Details  Name: Joseph Macdonald MRN: [id number] Date of Birth: 26-Mar-1946 Referring Provider:   Flowsheet Row Pulmonary Rehab from 07/04/2021 in [facility]  Referring Provider Wyvonna Plum ([place])       Encounter Date: 08/16/2021  Check In:  Session Check In - 08/16/21 0945       Check-In   Supervising physician immediately available to respond to emergencies See telemetry face sheet for immediately available ER MD    Location [facility]-Cardiac & Pulmonary Rehab    Staff Present [name], MPA, Elveria Rising, BA, ACSM CEP, Exercise Physiologist;Laureen Owens Shark, BS, RRT, CPFT    Virtual Visit No    Medication changes reported     No    Fall or balance concerns reported    No    Warm-up and Cool-down Performed on first and last piece of equipment    Resistance Training Performed Yes    VAD Patient? No    PAD/SET Patient? No      Pain Assessment   Currently in Pain? No/denies                Social History   Tobacco Use  Smoking Status Former   Packs/day: 0.50   Years: 40.00   Pack years: 20.00   Types: Pipe, Cigarettes   Quit date: 08/19/2017   Years since quitting: 3.9  Smokeless Tobacco Former   Quit date: 08/19/2017  Tobacco Comments   previously smoked 1 1/2 pack since his 75 years old  Quit 2018    Goals Met:  Independence with exercise equipment Exercise tolerated well No report of concerns or symptoms today Strength training completed today  Goals Unmet:  Not Applicable  Comments: Pt able to follow exercise prescription today without complaint.  Will continue to monitor for progression.    Dr. Emily Filbert is Medical Director for [facility].  Dr. Ottie Glazier is Medical Director for [facility].

## 2021-08-17 ENCOUNTER — Encounter: Payer: Self-pay | Admitting: *Deleted

## 2021-08-17 DIAGNOSIS — J9612 Chronic respiratory failure with hypercapnia: Secondary | ICD-10-CM

## 2021-08-17 NOTE — Progress Notes (Signed)
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 of this knowledge for meal planning. Follow up email sent as well. Written material given at graduation.   Biometrics:  Pre Biometrics - 07/04/21 1141       Pre Biometrics   Height '5\' 6"'$  (1.676 m)    Weight 239 lb 3.2 oz (108.5 kg)    BMI (Calculated) 38.63    Single Leg Stand 0 seconds  Nutrition Therapy Plan and Nutrition Goals:  Nutrition Therapy & Goals - 07/04/21 1142       Intervention Plan   Intervention Prescribe, educate and counsel regarding individualized specific dietary modifications aiming towards targeted core components such as weight, hypertension, lipid management, diabetes, heart failure and other comorbidities.    Expected Outcomes Short Term Goal: Understand basic principles of dietary content, such as calories, fat, sodium, cholesterol and nutrients.;Long Term Goal: Adherence to prescribed nutrition plan.;Short Term Goal: A plan has been developed with personal  nutrition goals set during dietitian appointment.             Nutrition Assessments:  MEDIFICTS Score Key: ?70 Need to make dietary changes  40-70 Heart Healthy Diet ? 40 Therapeutic Level Cholesterol Diet  Flowsheet Row Pulmonary Rehab from 07/04/2021 in [facility] Cardiac and Pulmonary Rehab  Picture Your Plate Total Score on Admission 57      Picture Your Plate Scores: <45 Unhealthy dietary pattern with much room for improvement. 41-50 Dietary pattern unlikely to meet recommendations for good health and room for improvement. 51-60 More healthful dietary pattern, with some room for improvement.  >60 Healthy dietary pattern, although there may be some specific behaviors that could be improved.   Nutrition Goals Re-Evaluation:  Nutrition Goals Re-Evaluation     [place] Name 08/16/21 [phone]             Goals   Nutrition Goal Meet with dietician       Comment Joseph Macdonald has met with a dietician previously at the [place], but nothing recently and would like to schedule an appointment with Melissa.       Expected Outcome Make appointment with dietician                Nutrition Goals Discharge (Final Nutrition Goals Re-Evaluation):  Nutrition Goals Re-Evaluation - 08/16/21 0958       Goals   Nutrition Goal Meet with dietician    Comment Joseph Macdonald has met with a dietician previously at the [place], but nothing recently and would like to schedule an appointment with Melissa.    Expected Outcome Make appointment with dietician             Psychosocial: Target Goals: Acknowledge presence or absence of significant depression and/or stress, maximize coping skills, provide positive support system. Participant is able to verbalize types and ability to use techniques and skills needed for reducing stress and depression.   Education: Stress, Anxiety, and Depression - Group verbal and visual presentation to define topics covered.  Reviews how body is impacted by stress, anxiety, and depression.  Also  discusses healthy ways to reduce stress and to treat/manage anxiety and depression.  Written material given at graduation.   Education: Sleep Hygiene -Provides group verbal and written instruction about how sleep can affect your health.  Define sleep hygiene, discuss sleep cycles and impact of sleep habits. Review good sleep hygiene tips.    Initial Review & Psychosocial Screening:  Initial Psych Review & Screening - 06/28/21 0928       Initial Review   Current issues with None Identified      Family Dynamics   Good Support System? Yes   wife of 34 years, 3 children, one here 2 other states     Barriers   Psychosocial barriers to participate in program There are no identifiable barriers or psychosocial needs.      Screening Interventions   Interventions Encouraged to exercise    Expected Outcomes Short Term  goal: Utilizing psychosocial counselor, staff and physician to assist with identification of specific Stressors or current issues interfering with healing process. Setting desired goal for each stressor or current issue identified.;Long Term Goal: Stressors or current issues are controlled or eliminated.;Short Term goal: Identification and review with participant of any Quality of Life or Depression concerns found by scoring the questionnaire.;Long Term goal: The participant improves quality of Life and PHQ9 Scores as seen by post scores and/or verbalization of changes             Quality of Life Scores:  Scores of 19 and below usually indicate a poorer quality of life in these areas.  A difference of  2-3 points is a clinically meaningful difference.  A difference of 2-3 points in the total score of the Quality of Life Index has been associated with significant improvement in overall quality of life, self-image, physical symptoms, and general health in studies assessing change in quality of life.  PHQ-9: Recent Review Flowsheet Data     Depression screen [facility] 2/9 07/04/2021  11/25/2020 10/28/2018 10/25/2017 08/28/2017   Decreased Interest 0 0 0 0 0   Down, Depressed, Hopeless 0 0 0 0 0   PHQ - 2 Score 0 0 0 0 0   Altered sleeping 0 - - 1 0   Tired, decreased energy 0 - - 1 0   Change in appetite 1 - - 0 0   Feeling bad or failure about yourself  0 - - 0 0   Trouble concentrating 0 - - 0 0   Moving slowly or fidgety/restless 0 - - 0 0   Suicidal thoughts 0 - - 0 0   PHQ-9 Score 1 - - 2 0   Difficult doing work/chores Not difficult at all - - Not difficult at all Not difficult at all      Interpretation of Total Score  Total Score Depression Severity:  1-4 = Minimal depression, 5-9 = Mild depression, 10-14 = Moderate depression, 15-19 = Moderately severe depression, 20-27 = Severe depression   Psychosocial Evaluation and Intervention:  Psychosocial Evaluation - 06/28/21 1041       Psychosocial Evaluation & Interventions   Interventions Encouraged to exercise with the program and follow exercise prescription    Comments Joseph Macdonald has no barriers to attending the program. He is a pulmonary patient with SOB that hinders his ability to manage day to day activities. He does have a rolling walker to help walking longer distances. He has a support team of his wife (47years) and 3 children. 2 children live out of state. 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 He has myasthenia gravis and this is possibly causing trouble with swallowed food moving to his stomach.  He has a gastro appt end of August. Joseph Macdonald has oxygen ordered for when he exerts himsel and at bedtime. He does not always use his oxygen when he should. He states he has no concerns with depression, stress nor anxiety. We will follow up for any changes.    Expected Outcomes STG: Joseph Macdonald will attend all scheduled sessoins and gain some relief with his SOB symptoms. He will wear his oxygen when exerting himself. LTG: Joseph Macdonald will see some progress with his  symptoms decreasing, he will maintain the use of his oxygen as prescribed    Continue Psychosocial Services  Follow up required by staff  Psychosocial Re-Evaluation:  Psychosocial Re-Evaluation     [place] Name 08/16/21 [phone]             Psychosocial Re-Evaluation   Current issues with Current Stress Concerns;Current Sleep Concerns       Comments Joseph Macdonald is doing well mentally. His biggest stressor continues to be his health and being able to do things that he wants.  He tries not to let things stress him out.  He does not sleep well and has a habit of waking up in the middle of the night every couple of hours.  He attributes this to years of having to get up at 230am to go to work.       Interventions Encouraged to attend Pulmonary Rehabilitation for the exercise       Continue Psychosocial Services  Follow up required by staff                Psychosocial Discharge (Final Psychosocial Re-Evaluation):  Psychosocial Re-Evaluation - 08/16/21 0956       Psychosocial Re-Evaluation   Current issues with Current Stress Concerns;Current Sleep Concerns    Comments Joseph Macdonald is doing well mentally. His biggest stressor continues to be his health and being able to do things that he wants.  He tries not to let things stress him out.  He does not sleep well and has a habit of waking up in the middle of the night every couple of hours.  He attributes this to years of having to get up at 230am to go to work.    Interventions Encouraged to attend Pulmonary Rehabilitation for the exercise    Continue Psychosocial Services  Follow up required by staff             Education: Education Goals: Education classes will be provided on a weekly basis, covering required topics. Participant will state understanding/return demonstration of topics presented.  Learning Barriers/Preferences:   General Pulmonary Education Topics:  Infection Prevention: - Provides verbal and written material to  individual with discussion of infection control including proper hand washing and proper equipment cleaning during exercise session. Flowsheet Row Pulmonary Rehab from 07/04/2021 in [facility] Cardiac and Pulmonary Rehab  Date 07/04/21  Educator [facility]  Instruction Review Code 1- Verbalizes Understanding       Falls Prevention: - Provides verbal and written material to individual with discussion of falls prevention and safety. Flowsheet Row Pulmonary Rehab from 07/04/2021 in [facility] Cardiac and Pulmonary Rehab  Date 06/28/21  Educator SB  Instruction Review Code 1- Verbalizes Understanding       Chronic Lung Disease Review: - Group verbal instruction with posters, models, PowerPoint presentations and videos,  to review new updates, new respiratory medications, new advancements in procedures and treatments. Providing information on websites and "800" numbers for continued self-education. Includes information about supplement oxygen, available portable oxygen systems, continuous and intermittent flow rates, oxygen safety, concentrators, and Medicare reimbursement for oxygen. Explanation of Pulmonary Drugs, including class, frequency, complications, importance of spacers, rinsing mouth after steroid MDI's, and proper cleaning methods for nebulizers. Review of basic lung anatomy and physiology related to function, structure, and complications of lung disease. Review of risk factors. Discussion about methods for diagnosing sleep apnea and types of masks and machines for OSA. Includes a review of the use of types of environmental controls: home humidity, furnaces, filters, dust mite/pet prevention, HEPA vacuums. Discussion about weather changes, air quality and the benefits of nasal washing. Instruction on Warning signs, infection symptoms, calling MD promptly,  preventive modes, and value of vaccinations. Review of effective airway clearance, coughing and/or vibration techniques. Emphasizing that all should Create an  Action Plan. Written material given at graduation. Flowsheet Row Pulmonary Rehab from 07/04/2021 in [facility] Cardiac and Pulmonary Rehab  Education need identified 07/04/21       AED/CPR: - Group verbal and written instruction with the use of models to demonstrate the basic use of the AED with the basic ABC's of resuscitation.    Anatomy and Cardiac Procedures: - Group verbal and visual presentation and models provide information about basic cardiac anatomy and function. Reviews the testing methods done to diagnose heart disease and the outcomes of the test results. Describes the treatment choices: Medical Management, Angioplasty, or Coronary Bypass Surgery for treating various heart conditions including Myocardial Infarction, Angina, Valve Disease, and Cardiac Arrhythmias.  Written material given at graduation.   Medication Safety: - Group verbal and visual instruction to review commonly prescribed medications for heart and lung disease. Reviews the medication, class of the drug, and side effects. Includes the steps to properly store meds and maintain the prescription regimen.  Written material given at graduation.   Other: -Provides group and verbal instruction on various topics (see comments)   Knowledge Questionnaire Score:  Knowledge Questionnaire Score - 07/04/21 1143       Knowledge Questionnaire Score   Pre Score 15/18 exercies, O2 safety, resp meds              Core Components/Risk Factors/Patient Goals at Admission:  Personal Goals and Risk Factors at Admission - 07/04/21 1144       Core Components/Risk Factors/Patient Goals on Admission    Weight Management Yes;Weight Loss;Obesity    Intervention Weight Management: Develop a combined nutrition and exercise program designed to reach desired caloric intake, while maintaining appropriate intake of nutrient and fiber, sodium and fats, and appropriate energy expenditure required for the weight goal.;Weight Management:  Provide education and appropriate resources to help participant work on and attain dietary goals.;Weight Management/Obesity: Establish reasonable short term and long term weight goals.;Obesity: Provide education and appropriate resources to help participant work on and attain dietary goals.    Admit Weight 239 lb 3.2 oz (108.5 kg)    Goal Weight: Short Term 235 lb (106.6 kg)    Goal Weight: Long Term 189 lb (85.7 kg)    Expected Outcomes Short Term: Continue to assess and modify interventions until short term weight is achieved;Long Term: Adherence to nutrition and physical activity/exercise program aimed toward attainment of established weight goal;Weight Loss: Understanding of general recommendations for a balanced deficit meal plan, which promotes 1-2 lb weight loss per week and includes a negative energy balance of [phone] kcal/d;Understanding recommendations for meals to include 15-35% energy as protein, 25-35% energy from fat, 35-60% energy from carbohydrates, less than $RemoveB'200mg'nYTJBDVW$  of dietary cholesterol, 20-35 gm of total fiber daily;Understanding of distribution of calorie intake throughout the day with the consumption of 4-5 meals/snacks    Increase knowledge of respiratory medications and ability to use respiratory devices properly  Yes    Intervention Provide education and demonstration as needed of appropriate use of medications, inhalers, and oxygen therapy.    Expected Outcomes Short Term: Achieves understanding of medications use. Understands that oxygen is a medication prescribed by physician. Demonstrates appropriate use of inhaler and oxygen therapy.;Long Term: Maintain appropriate use of medications, inhalers, and oxygen therapy.    Hypertension Yes    Intervention Provide education on lifestyle modifcations including regular physical activity/exercise, weight  management, moderate sodium restriction and increased consumption of fresh fruit, vegetables, and low fat dairy, alcohol moderation,  and smoking cessation.;Monitor prescription use compliance.    Expected Outcomes Short Term: Continued assessment and intervention until BP is < 140/54mm HG in hypertensive participants. < 130/75mm HG in hypertensive participants with diabetes, heart failure or chronic kidney disease.;Long Term: Maintenance of blood pressure at goal levels.    Lipids Yes    Intervention Provide education and support for participant on nutrition & aerobic/resistive exercise along with prescribed medications to achieve LDL '70mg'$ , HDL >$Remo'40mg'zmqwx$ .    Expected Outcomes Short Term: Participant states understanding of desired cholesterol values and is compliant with medications prescribed. Participant is following exercise prescription and nutrition guidelines.;Long Term: Cholesterol controlled with medications as prescribed, with individualized exercise RX and with personalized nutrition plan. Value goals: LDL < $Rem'70mg'HUHL$ , HDL > 40 mg.             Education:Diabetes - Individual verbal and written instruction to review signs/symptoms of diabetes, desired ranges of glucose level fasting, after meals and with exercise. Acknowledge that pre and post exercise glucose checks will be done for 3 sessions at entry of program. Flowsheet Row Pulmonary Rehab from 07/04/2021 in [facility] Cardiac and Pulmonary Rehab  Date 07/04/21  Educator [facility]  Instruction Review Code 1- Verbalizes Understanding       Know Your Numbers and Heart Failure: - Group verbal and visual instruction to discuss disease risk factors for cardiac and pulmonary disease and treatment options.  Reviews associated critical values for Overweight/Obesity, Hypertension, Cholesterol, and Diabetes.  Discusses basics of heart failure: signs/symptoms and treatments.  Introduces Heart Failure Zone chart for action plan for heart failure.  Written material given at graduation.   Core Components/Risk Factors/Patient Goals Review:   Goals and Risk Factor Review     Row Name  08/16/21 1000             Core Components/Risk Factors/Patient Goals Review   Personal Goals Review Weight Management/Obesity;Improve shortness of breath with ADL's;Hypertension;Lipids;Increase knowledge of respiratory medications and ability to use respiratory devices properly.       Review Joseph Macdonald's weight is holding steady.  He would like to lose some more, but not doing enough recently.  He is doing well with his breathing and trying not to labor too much.  He could tell a difference when he was out with pnuemonia.  He is feeling much better now.  He is doing well with his blood pressures and checks both it and his weight twice a day. He is doing well with his meds and uses his nebulizers routinely.       Expected Outcomes Short: Continue to work on weight loss  Long: Continue to monitor risk factors.                Core Components/Risk Factors/Patient Goals at Discharge (Final Review):   Goals and Risk Factor Review - 08/16/21 1000       Core Components/Risk Factors/Patient Goals Review   Personal Goals Review Weight Management/Obesity;Improve shortness of breath with ADL's;Hypertension;Lipids;Increase knowledge of respiratory medications and ability to use respiratory devices properly.    Review Joseph Macdonald's weight is holding steady.  He would like to lose some more, but not doing enough recently.  He is doing well with his breathing and trying not to labor too much.  He could tell a difference when he was out with pnuemonia.  He is feeling much better now.  He is doing well with  his blood pressures and checks both it and his weight twice a day. He is doing well with his meds and uses his nebulizers routinely.    Expected Outcomes Short: Continue to work on weight loss  Long: Continue to monitor risk factors.             ITP Comments:  ITP Comments     Row Name 06/28/21 1036 07/04/21 1133 07/07/21 0945 07/20/21 0746 08/01/21 1038   ITP Comments Step one of orientation completed today.  he has an appointment on Date: 07/04/2021  for EP eval and gym Orientation.  Documentation of diagnosis can be found in [facility] rescords) Date: 07/04/2021 . Completed 6MWT and gym orientation. Initial ITP created and sent for review to Dr. Zetta Bills, Medical Director. First full day of exercise!  Patient was oriented to gym and equipment including functions, settings, policies, and procedures.  Patient's individual exercise prescription and treatment plan were reviewed.  All starting workloads were established based on the results of the 6 minute walk test done at initial orientation visit.  The plan for exercise progression was also introduced and progression will be customized based on patient's performance and goals. 30 Day review completed. Medical Director ITP review done, changes made as directed, and signed approval by Medical Director.    New to program Joseph Macdonald was in the ED on 8/14 and admitted to the [place] for pneumonia- he states he feels better and plans to be back to rehab tomorrow, 8/23. Clearance will be pending on doctors notes.    [place] Name 08/02/21 1533 08/16/21 0947 08/16/21 0953 08/17/21 0645     ITP Comments Joseph Macdonald called to let us know he has been discharged from hospital. He was admitted for pneumonia. His discharge was to not exert himself. He was advised to stay out the rest of this week.  Tried to find admission note, not available as he was transferred to the [facility] doctor's clearance received allowing him to return to rehab. see note in binder dated 08/10/21. Joseph Macdonald returned today from being out. 30 Day review completed. Medical Director ITP review done, changes made as directed, and signed approval by Medical Director.             Comments:

## 2021-08-22 ENCOUNTER — Other Ambulatory Visit: Payer: Self-pay

## 2021-08-22 ENCOUNTER — Encounter: Payer: Self-pay | Admitting: Family Medicine

## 2021-08-22 DIAGNOSIS — M9979 Connective tissue and disc stenosis of intervertebral foramina of abdomen and other regions: Secondary | ICD-10-CM

## 2021-08-22 NOTE — Telephone Encounter (Signed)
Patient sent mychart message requesting refill.

## 2021-08-23 DIAGNOSIS — M1711 Unilateral primary osteoarthritis, right knee: Secondary | ICD-10-CM | POA: Diagnosis not present

## 2021-08-23 MED ORDER — HYDROCODONE-ACETAMINOPHEN 5-325 MG PO TABS: 1.0000 | ORAL_TABLET | Freq: Four times a day (QID) | ORAL | 0 refills | Status: DC | PRN

## 2021-08-25 NOTE — Telephone Encounter (Signed)
Dr. Caryl Comes any feedback from Dr. Gasper Sells regarding the MRI.

## 2021-08-29 ENCOUNTER — Encounter: Payer: Self-pay | Admitting: *Deleted

## 2021-08-29 DIAGNOSIS — J9612 Chronic respiratory failure with hypercapnia: Secondary | ICD-10-CM

## 2021-08-30 DIAGNOSIS — M1711 Unilateral primary osteoarthritis, right knee: Secondary | ICD-10-CM | POA: Diagnosis not present

## 2021-09-05 NOTE — Telephone Encounter (Signed)
Joseph Sprang, MD [phone])  SentKerrin Macdonald  6:22 PM  To: Joseph Filbert, RN          Message  H.  Called and spoke with patient after review with MCh. 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  Also no scarring (LGE neg). So nothing for him but he will need referral to [facility] for genetics    Thx. SK

## 2021-09-06 DIAGNOSIS — M1711 Unilateral primary osteoarthritis, right knee: Secondary | ICD-10-CM | POA: Diagnosis not present

## 2021-09-07 ENCOUNTER — Ambulatory Visit: Payer: PPO

## 2021-09-07 DIAGNOSIS — E119 Type 2 diabetes mellitus without complications: Secondary | ICD-10-CM | POA: Diagnosis not present

## 2021-09-07 DIAGNOSIS — D692 Other nonthrombocytopenic purpura: Secondary | ICD-10-CM | POA: Diagnosis not present

## 2021-09-07 DIAGNOSIS — J449 Chronic obstructive pulmonary disease, unspecified: Secondary | ICD-10-CM | POA: Diagnosis not present

## 2021-09-07 DIAGNOSIS — S41112D Laceration without foreign body of left upper arm, subsequent encounter: Secondary | ICD-10-CM | POA: Diagnosis not present

## 2021-09-07 DIAGNOSIS — I10 Essential (primary) hypertension: Secondary | ICD-10-CM | POA: Diagnosis not present

## 2021-09-07 DIAGNOSIS — Z8639 Personal history of other endocrine, nutritional and metabolic disease: Secondary | ICD-10-CM | POA: Diagnosis not present

## 2021-09-07 DIAGNOSIS — Z9981 Dependence on supplemental oxygen: Secondary | ICD-10-CM | POA: Diagnosis not present

## 2021-09-07 NOTE — Telephone Encounter (Signed)
Clarified with Dr. Caryl Comes that he did make the patient aware of the need for a genetics referral to Dr. Broadus John.  Referral placed/ lab order placed.

## 2021-09-08 ENCOUNTER — Encounter: Payer: No Typology Code available for payment source | Admitting: *Deleted

## 2021-09-08 ENCOUNTER — Other Ambulatory Visit: Payer: Self-pay

## 2021-09-08 DIAGNOSIS — J9612 Chronic respiratory failure with hypercapnia: Secondary | ICD-10-CM | POA: Diagnosis not present

## 2021-09-08 NOTE — Progress Notes (Signed)
Daily Session Note  Patient Details  Name: AROLDO GALLI MRN: [id number] Date of Birth: 11-10-1946 Referring Provider:   Flowsheet Row Pulmonary Rehab from 07/04/2021 in [facility] Cardiac and Pulmonary Rehab  Referring Provider Wyvonna Plum ([place])       Encounter Date: 09/08/2021  Check In:  Session Check In - 09/08/21 1018       Check-In   Supervising physician immediately available to respond to emergencies See telemetry face sheet for immediately available ER MD    Location [facility]-Cardiac & Pulmonary Rehab    Staff Present Nyoka Cowden, RN, BSN, [place], BS, ACSM CEP, Exercise Physiologist;Amanda Oletta Darter, [place], ACSM CEP, Exercise Physiologist    Virtual Visit No    Medication changes reported     No    Fall or balance concerns reported    No    Tobacco Cessation No Change    Warm-up and Cool-down Performed on first and last piece of equipment    Resistance Training Performed Yes    VAD Patient? No    PAD/SET Patient? No      Pain Assessment   Currently in Pain? No/denies                Social History   Tobacco Use  Smoking Status Former   Packs/day: 0.50   Years: 40.00   Pack years: 20.00   Types: Pipe, Cigarettes   Quit date: 08/19/2017   Years since quitting: 4.0  Smokeless Tobacco Former   Quit date: 08/19/2017  Tobacco Comments   previously smoked 1 1/2 pack since his 75 years old  Quit 2018    Goals Met:  Independence with exercise equipment Exercise tolerated well No report of concerns or symptoms today  Goals Unmet:  Not Applicable  Comments: Pt able to follow exercise prescription today without complaint.  Will continue to monitor for progression.    Dr. Emily Filbert is Medical Director for [facility].  Dr. Ottie Glazier is Medical Director for [facility] Pulmonary Rehabilitation.

## 2021-09-13 ENCOUNTER — Telehealth: Payer: Self-pay

## 2021-09-13 NOTE — Telephone Encounter (Signed)
Called Joseph Macdonald for his nutrition appointment this morning, he was not able to talk due to left knee pain and shortness of breath. Asked if he had let his doctor know; his wife mentioned that he will have episodes of worsening shortness of breath from time to time and will need to rest and have his O2 on. She reports he is not having any issues with fluid at this time.

## 2021-09-14 ENCOUNTER — Encounter: Payer: Self-pay | Admitting: *Deleted

## 2021-09-14 ENCOUNTER — Other Ambulatory Visit: Payer: Self-pay

## 2021-09-14 ENCOUNTER — Ambulatory Visit (INDEPENDENT_AMBULATORY_CARE_PROVIDER_SITE_OTHER): Payer: PPO

## 2021-09-14 DIAGNOSIS — Z23 Encounter for immunization: Secondary | ICD-10-CM | POA: Diagnosis not present

## 2021-09-14 DIAGNOSIS — J9612 Chronic respiratory failure with hypercapnia: Secondary | ICD-10-CM

## 2021-09-14 NOTE — Progress Notes (Signed)
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 a consistent rate Long: Continue to increase overall strength and stamina Short: Return to regular attendance Long: Continue to improve stamina. --    Row Name 09/08/21 0954 09/12/21 0845           Exercise Goal Re-Evaluation   Exercise Goals Review Increase Physical Activity;Increase Strength and Stamina;Understanding of Exercise Prescription Increase Physical Activity;Increase Strength and Stamina      Comments He will walk outside and getting occupational therapy (1 more visit) - he is doing some exercises at home like leg lifts and using canned of food to do some weight and some range of motion laying down. He had some shots in his left knee to help with pain. He has not gone over home exercise with EP yet. Wille Glaser has only attended twice in September.  More consistent attendance would yield better results.      Expected Outcomes Short: Return to  regular attendance, EP to go over home exercise Long: Continue to improve stamina. Short:  attend consistently Long: build overall stamina               Discharge Exercise Prescription (Final Exercise Prescription Changes):  Exercise Prescription Changes - 09/12/21 0800       Response to Exercise   Blood Pressure (Admit) 110/62    Blood Pressure (Exercise) 138/78    Blood Pressure (Exit) 108/66    Heart Rate (Admit) 65 bpm    Heart Rate (Exercise) 77 bpm    Heart Rate (Exit) 64 bpm    Oxygen Saturation (Admit) 90 %    Oxygen Saturation (Exercise) 95 %    Oxygen Saturation (Exit) 98 %    Rating of Perceived Exertion (Exercise) 13    Perceived Dyspnea (Exercise) 0    Duration Continue with 30 min of aerobic exercise without signs/symptoms of physical distress.    Intensity THRR unchanged      Progression   Progression Continue to progress workloads to maintain intensity without signs/symptoms of physical distress.      Resistance Training   Training Prescription Yes    Weight 2 lb    Reps 10-15      Oxygen   Oxygen Continuous    Liters 1      Biostep-RELP   Level 1    SPM 50    Minutes 30    METs 1             Nutrition:  Target Goals: Understanding of nutrition guidelines, daily intake of sodium <1550m, cholesterol <2032m calories 30% from fat and 7% or less from saturated fats, daily to have 5 or more servings of fruits and vegetables.  Education: All About Nutrition: -Group instruction provided by verbal, written material, interactive activities, discussions, models, and posters to present general guidelines for heart healthy nutrition including fat, fiber, MyPlate, the role of sodium in heart healthy nutrition, utilization of the nutrition label, and utilization of this knowledge for meal planning. Follow up email sent as well. Written material given at graduation.   Biometrics:  Pre Biometrics - 07/04/21 1141       Pre Biometrics   Height _0   (1.676 m)    Weight 239 lb 3.2 oz (108.5 kg)    BMI (Calculated) 38.63    Single Leg Stand 0 seconds              Nutrition Therapy Plan and Nutrition Goals:  Nutrition Therapy & Goals - 07/04/21 1142       Intervention Plan   Intervention Prescribe, educate and counsel regarding individualized specific dietary modifications aiming towards targeted core components such as weight, hypertension, lipid management, diabetes, heart failure and other comorbidities.    Expected Outcomes Short Term Goal: Understand basic principles of dietary content, such as calories, fat, sodium, cholesterol and nutrients.;Long Term Goal: Adherence to prescribed nutrition plan.;Short Term Goal: A plan has been developed with personal nutrition goals set during dietitian appointment.             Nutrition Assessments:  MEDIFICTS Score Key: ?70 Need to make dietary changes  40-70 Heart Healthy Diet ? 40 Therapeutic Level Cholesterol Diet  [facility]  Picture Your Plate Total Score on Admission 57      Picture Your Plate Scores: <4<54nhealthy dietary pattern with much room for improvement. 41-50 Dietary pattern unlikely to meet recommendations for good health and room for improvement. 51-60 More healthful dietary pattern, with some room for improvement.  >  60 Healthy dietary pattern, although there may be some specific behaviors that could be improved.   Nutrition Goals Re-Evaluation:  Nutrition Goals Re-Evaluation     Row Name 08/16/21 [phone] 09/08/21 0942           Goals   Nutrition Goal Meet with dietician --      Comment Joe has met with a dietician previously at the [place], but nothing recently and would like to schedule an appointment with Melissa. Joe has had inconsistent attendance, made appointment for 10/4.      Expected Outcome Make appointment with dietician --               Nutrition Goals Discharge  (Final Nutrition Goals Re-Evaluation):  Nutrition Goals Re-Evaluation - 09/08/21 0942       Goals   Comment Joe has had inconsistent attendance, made appointment for 10/4.             Psychosocial: Target Goals: Acknowledge presence or absence of significant depression and/or stress, maximize coping skills, provide positive support system. Participant is able to verbalize types and ability to use techniques and skills needed for reducing stress and depression.   Education: Stress, Anxiety, and Depression - Group verbal and visual presentation to define topics covered.  Reviews how body is impacted by stress, anxiety, and depression.  Also discusses healthy ways to reduce stress and to treat/manage anxiety and depression.  Written material given at graduation.   Education: Sleep Hygiene -Provides group verbal and written instruction about how sleep can affect your health.  Define sleep hygiene, discuss sleep cycles and impact of sleep habits. Review good sleep hygiene tips.    Initial Review & Psychosocial Screening:  Initial Psych Review & Screening - 06/28/21 0928       Initial Review   Current issues with None Identified      Family Dynamics   Good Support System? Yes   wife of 82 years, 3 children, one here 2 other states     Barriers   Psychosocial barriers to participate in program There are no identifiable barriers or psychosocial needs.      Screening Interventions   Interventions Encouraged to exercise    Expected Outcomes Short Term goal: Utilizing psychosocial counselor, staff and physician to assist with identification of specific Stressors or current issues interfering with healing process. Setting desired goal for each stressor or current issue identified.;Long Term Goal: Stressors or current issues are controlled or eliminated.;Short Term goal: Identification and review with participant of any Quality of Life or Depression concerns found by scoring the  questionnaire.;Long Term goal: The participant improves quality of Life and PHQ9 Scores as seen by post scores and/or verbalization of changes             Quality of Life Scores:  Scores of 19 and below usually indicate a poorer quality of life in these areas.  A difference of  2-3 points is a clinically meaningful difference.  A difference of 2-3 points in the total score of the Quality of Life Index has been associated with significant improvement in overall quality of life, self-image, physical symptoms, and general health in studies assessing change in quality of life.  PHQ-9: Recent Review Flowsheet Data     Depression screen [facility] 2/9 07/04/2021 11/25/2020 10/28/2018 10/25/2017 08/28/2017   Decreased Interest 0 0 0 0 0   Down, Depressed, Hopeless 0 0 0 0 0   PHQ - 2 Score 0 0 0 0 0  Altered sleeping 0 - - 1 0   Tired, decreased energy 0 - - 1 0   Change in appetite 1 - - 0 0   Feeling bad or failure about yourself  0 - - 0 0   Trouble concentrating 0 - - 0 0   Moving slowly or fidgety/restless 0 - - 0 0   Suicidal thoughts 0 - - 0 0   PHQ-9 Score 1 - - 2 0   Difficult doing work/chores Not difficult at all - - Not difficult at all Not difficult at all      Interpretation of Total Score  Total Score Depression Severity:  1-4 = Minimal depression, 5-9 = Mild depression, 10-14 = Moderate depression, 15-19 = Moderately severe depression, 20-27 = Severe depression   Psychosocial Evaluation and Intervention:  Psychosocial Evaluation - 06/28/21 1041       Psychosocial Evaluation & Interventions   Interventions Encouraged to exercise with the program and follow exercise prescription    Comments Joe has no barriers to attending the program. He is a pulmonary patient with SOB that hinders his ability to manage day to day activities. He does have a rolling walker to help walking longer distances. He has a support team of his wife (47years) and 3 children. 2 children live out of  state. 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 He has myasthenia gravis and this is possibly causing trouble with swallowed food moving to his stomach.  He has a gastro appt end of August. Joe has oxygen ordered for when he exerts himsel and at bedtime. He does not always use his oxygen when he should. He states he has no concerns with depression, stress nor anxiety. We will follow up for any changes.    Expected Outcomes STG: Joe will attend all scheduled sessoins and gain some relief with his SOB symptoms. He will wear his oxygen when exerting himself. LTG: Joe will see some progress with his symptoms decreasing, he will maintain the use of his oxygen as prescribed    Continue Psychosocial Services  Follow up required by staff             Psychosocial Re-Evaluation:  Psychosocial Re-Evaluation     [place] Name 08/16/21 0956 09/08/21 1002           Psychosocial Re-Evaluation   Current issues with Current Stress Concerns;Current Sleep Concerns --      Comments Wille Glaser is doing well mentally. His biggest stressor continues to be his health and being able to do things that he wants.  He tries not to let things stress him out.  He does not sleep well and has a habit of waking up in the middle of the night every couple of hours.  He attributes this to years of having to get up at 230am to go to work. Wille Glaser is doing well mentally. He reports no stressors a this time, he is feeling ok about his health status and accepts it as a part of life now. He likes to keep his mind busy and play games on his phone as well as read. He continues to not sleep well and has a habit of waking up in the middle of the night every couple of hours.  He attributes this to years of having to get up at 230am to go to work. he feels it does not affect his energy too much and will take  a nap during the day if needed. He relies on his wife as a support system.       Expected Outcomes -- ST: continue to manage stress LT: maintain positive attitude      Interventions Encouraged to attend Pulmonary Rehabilitation for the exercise Encouraged to attend Pulmonary Rehabilitation for the exercise      Continue Psychosocial Services  Follow up required by staff Follow up required by staff               Psychosocial Discharge (Final Psychosocial Re-Evaluation):  Psychosocial Re-Evaluation - 09/08/21 1002       Psychosocial Re-Evaluation   Comments Wille Glaser is doing well mentally. He reports no stressors a this time, he is feeling ok about his health status and accepts it as a part of life now. He likes to keep his mind busy and play games on his phone as well as read. He continues to not sleep well and has a habit of waking up in the middle of the night every couple of hours.  He attributes this to years of having to get up at 230am to go to work. he feels it does not affect his energy too much and will take a nap during the day if needed. He relies on his wife as a support system.    Expected Outcomes ST: continue to manage stress LT: maintain positive attitude    Interventions Encouraged to attend Pulmonary Rehabilitation for the exercise    Continue Psychosocial Services  Follow up required by staff             Education: Education Goals: Education classes will be provided on a weekly basis, covering required topics. Participant will state understanding/return demonstration of topics presented.  Learning Barriers/Preferences:   General Pulmonary Education Topics:  Infection Prevention: - Provides verbal and written material to individual with discussion of infection control including proper hand washing and proper equipment cleaning during exercise session. Flowsheet Row Pulmonary Rehab from 07/04/2021 in [facility] Pulmonary Rehab  Date 07/04/21  Educator [facility]  Instruction Review Code 1- Verbalizes Understanding       Falls Prevention: -  Provides verbal and written material to individual with discussion of falls prevention and safety. Flowsheet Row Pulmonary Rehab from 07/04/2021 in [facility] Cardiac and Pulmonary Rehab  Date 06/28/21  Educator SB  Instruction Review Code 1- Verbalizes Understanding       Chronic Lung Disease Review: - Group verbal instruction with posters, models, PowerPoint presentations and videos,  to review new updates, new respiratory medications, new advancements in procedures and treatments. Providing information on websites and "800" numbers for continued self-education. Includes information about supplement oxygen, available portable oxygen systems, continuous and intermittent flow rates, oxygen safety, concentrators, and Medicare reimbursement for oxygen. Explanation of Pulmonary Drugs, including class, frequency, complications, importance of spacers, rinsing mouth after steroid MDI's, and proper cleaning methods for nebulizers. Review of basic lung anatomy and physiology related to function, structure, and complications of lung disease. Review of risk factors. Discussion about methods for diagnosing sleep apnea and types of masks and machines for OSA. Includes a review of the use of types of environmental controls: home humidity, furnaces, filters, dust mite/pet prevention, HEPA vacuums. Discussion about weather changes, air quality and the benefits of nasal washing. Instruction on Warning signs, infection symptoms, calling MD promptly, preventive modes, and value of vaccinations. Review of effective airway clearance, coughing and/or vibration techniques. Emphasizing that all should Create an Action Plan. Written material given  at graduation. Flowsheet Row Pulmonary Rehab from 07/04/2021 in [facility] Pulmonary Rehab  Education need identified 07/04/21       AED/CPR: - Group verbal and written instruction with the use of models to demonstrate the basic use of the AED with the basic ABC's of  resuscitation.    Anatomy and Cardiac Procedures: - Group verbal and visual presentation and models provide information about basic cardiac anatomy and function. Reviews the testing methods done to diagnose heart disease and the outcomes of the test results. Describes the treatment choices: Medical Management, Angioplasty, or Coronary Bypass Surgery for treating various heart conditions including Myocardial Infarction, Angina, Valve Disease, and Cardiac Arrhythmias.  Written material given at graduation.   Medication Safety: - Group verbal and visual instruction to review commonly prescribed medications for heart and lung disease. Reviews the medication, class of the drug, and side effects. Includes the steps to properly store meds and maintain the prescription regimen.  Written material given at graduation.   Other: -Provides group and verbal instruction on various topics (see comments)   Knowledge Questionnaire Score:  Knowledge Questionnaire Score - 07/04/21 1143       Knowledge Questionnaire Score   Pre Score 15/18 exercies, O2 safety, resp meds              Core Components/Risk Factors/Patient Goals at Admission:  Personal Goals and Risk Factors at Admission - 07/04/21 1144       Core Components/Risk Factors/Patient Goals on Admission    Weight Management Yes;Weight Loss;Obesity    Intervention Weight Management: Develop a combined nutrition and exercise program designed to reach desired caloric intake, while maintaining appropriate intake of nutrient and fiber, sodium and fats, and appropriate energy expenditure required for the weight goal.;Weight Management: Provide education and appropriate resources to help participant work on and attain dietary goals.;Weight Management/Obesity: Establish reasonable short term and long term weight goals.;Obesity: Provide education and appropriate resources to help participant work on and attain dietary goals.    Admit Weight 239 lb 3.2  oz (108.5 kg)    Goal Weight: Short Term 235 lb (106.6 kg)    Goal Weight: Long Term 189 lb (85.7 kg)    Expected Outcomes Short Term: Continue to assess and modify interventions until short term weight is achieved;Long Term: Adherence to nutrition and physical activity/exercise program aimed toward attainment of established weight goal;Weight Loss: Understanding of general recommendations for a balanced deficit meal plan, which promotes 1-2 lb weight loss per week and includes a negative energy balance of [phone] kcal/d;Understanding recommendations for meals to include 15-35% energy as protein, 25-35% energy from fat, 35-60% energy from carbohydrates, less than 217m of dietary cholesterol, 20-35 gm of total fiber daily;Understanding of distribution of calorie intake throughout the day with the consumption of 4-5 meals/snacks    Increase knowledge of respiratory medications and ability to use respiratory devices properly  Yes    Intervention Provide education and demonstration as needed of appropriate use of medications, inhalers, and oxygen therapy.    Expected Outcomes Short Term: Achieves understanding of medications use. Understands that oxygen is a medication prescribed by physician. Demonstrates appropriate use of inhaler and oxygen therapy.;Long Term: Maintain appropriate use of medications, inhalers, and oxygen therapy.    Hypertension Yes    Intervention Provide education on lifestyle modifcations including regular physical activity/exercise, weight management, moderate sodium restriction and increased consumption of fresh fruit, vegetables, and low fat dairy, alcohol moderation, and smoking cessation.;Monitor prescription use compliance.  Expected Outcomes Short Term: Continued assessment and intervention until BP is < 140/53m HG in hypertensive participants. < 130/832mHG in hypertensive participants with diabetes, heart failure or chronic kidney disease.;Long Term: Maintenance of blood  pressure at goal levels.    Lipids Yes    Intervention Provide education and support for participant on nutrition & aerobic/resistive exercise along with prescribed medications to achieve LDL <7079mHDL >31m73m  Expected Outcomes Short Term: Participant states understanding of desired cholesterol values and is compliant with medications prescribed. Participant is following exercise prescription and nutrition guidelines.;Long Term: Cholesterol controlled with medications as prescribed, with individualized exercise RX and with personalized nutrition plan. Value goals: LDL < 70mg86mL > 40 mg.             Education:Diabetes - Individual verbal and written instruction to review signs/symptoms of diabetes, desired ranges of glucose level fasting, after meals and with exercise. Acknowledge that pre and post exercise glucose checks will be done for 3 sessions at entry of program. Flowsheet [facility]  Date 07/04/21  Educator [facility] Review Code 1- Verbalizes Understanding       Know Your Numbers and Heart Failure: - Group verbal and visual instruction to discuss disease risk factors for cardiac and pulmonary disease and treatment options.  Reviews associated critical values for Overweight/Obesity, Hypertension, Cholesterol, and Diabetes.  Discusses basics of heart failure: signs/symptoms and treatments.  Introduces Heart Failure Zone chart for action plan for heart failure.  Written material given at graduation.   Core Components/Risk Factors/Patient Goals Review:   Goals and Risk Factor Review     Row Name 08/16/21 1000 09/08/21 0959           Core Components/Risk Factors/Patient Goals Review   Personal Goals Review Weight Management/Obesity;Improve shortness of breath with ADL's;Hypertension;Lipids;Increase knowledge of respiratory medications and ability to use respiratory devices properly. Weight  Management/Obesity;Improve shortness of breath with ADL's;Hypertension;Lipids;Increase knowledge of respiratory medications and ability to use respiratory devices properly.      Review Joe's weight is holding steady.  He would like to lose some more, but not doing enough recently.  He is doing well with his breathing and trying not to labor too much.  He could tell a difference when he was out with pnuemonia.  He is feeling much better now.  He is doing well with his blood pressures and checks both it and his weight twice a day. He is doing well with his meds and uses his nebulizers routinely. He checks his BP at home ~125/65, in rehab today BP was 110/62. He continues to stay weight stable, he now has a digital scale. He continues to do ok with his breathing, but thinks it is about the same (he also checks his O2 with a pulse ox at home). He had pneumonia in August and feels that had set him back. He continues to take his medication as prescribed and is not having any issues.      Expected Outcomes Short: Continue to work on weight loss  Long: Continue to monitor risk factors. Short: Continue to improve breathing by attending consistently  Long: Continue to monitor risk factors.               Core Components/Risk Factors/Patient Goals at Discharge (Final Review):   Goals and Risk Factor Review - 09/08/21 0959       Core Components/Risk Factors/Patient Goals Review   Personal Goals  Review Weight Management/Obesity;Improve shortness of breath with ADL's;Hypertension;Lipids;Increase knowledge of respiratory medications and ability to use respiratory devices properly.    Review He checks his BP at home ~125/65, in rehab today BP was 110/62. He continues to stay weight stable, he now has a digital scale. He continues to do ok with his breathing, but thinks it is about the same (he also checks his O2 with a pulse ox at home). He had pneumonia in August and feels that had set him back. He continues to take  his medication as prescribed and is not having any issues.    Expected Outcomes Short: Continue to improve breathing by attending consistently  Long: Continue to monitor risk factors.             ITP Comments:  ITP Comments     Row Name 06/28/21 1036 07/04/21 1133 07/07/21 0945 07/20/21 0746 08/01/21 1038   ITP Comments Step one of orientation completed today. he has an appointment on Date: 07/04/2021  for EP eval and gym Orientation.  Documentation of diagnosis can be found in [facility] rescords) Date: 07/04/2021 . Completed 6MWT and gym orientation. Initial ITP created and sent for review to Dr. Zetta Bills, Medical Director. First full day of exercise!  Patient was oriented to gym and equipment including functions, settings, policies, and procedures.  Patient's individual exercise prescription and treatment plan were reviewed.  All starting workloads were established based on the results of the 6 minute walk test done at initial orientation visit.  The plan for exercise progression was also introduced and progression will be customized based on patient's performance and goals. 30 Day review completed. Medical Director ITP review done, changes made as directed, and signed approval by Medical Director.    New to program Wille Glaser was in the ED on 8/14 and admitted to the [place] for pneumonia- he states he feels better and plans to be back to rehab tomorrow, 8/23. Clearance will be pending on doctors notes.    [place] Name 08/02/21 1533 08/16/21 0947 08/16/21 0953 08/17/21 0645 08/29/21 1328   ITP Comments Zael called to let us know he has been discharged from hospital. He was admitted for pneumonia. His discharge was to not exert himself. He was advised to stay out the rest of this week.  Tried to find admission note, not available as he was transferred to the [facility] doctor's clearance received allowing him to return to rehab. see note in binder dated 08/10/21. Joe returned today from being out. 30  Day review completed. Medical Director ITP review done, changes made as directed, and signed approval by Medical Director. Joe has had multiple call outs with not feeling well and his knee being swollen.    [place] Name 09/14/21 0943           ITP Comments 30 day review completed. ITP sent to Dr. Zetta Bills, Medical Director of Pulmonary Rehab. Continue with ITP unless changes are made by physician. Pt has been out sick and dealing with his knee.  Last attended on 08/16/21.                Comments: 30 day review

## 2021-09-15 ENCOUNTER — Encounter: Payer: PPO | Attending: General Practice

## 2021-09-21 ENCOUNTER — Telehealth: Payer: Self-pay

## 2021-09-21 NOTE — Telephone Encounter (Signed)
LMOM

## 2021-09-23 ENCOUNTER — Other Ambulatory Visit: Payer: Self-pay

## 2021-09-23 ENCOUNTER — Encounter: Payer: Self-pay | Admitting: Family Medicine

## 2021-09-23 DIAGNOSIS — M9979 Connective tissue and disc stenosis of intervertebral foramina of abdomen and other regions: Secondary | ICD-10-CM

## 2021-09-23 MED ORDER — HYDROCODONE-ACETAMINOPHEN 5-325 MG PO TABS: 1.0000 | ORAL_TABLET | Freq: Four times a day (QID) | ORAL | 0 refills | Status: DC | PRN

## 2021-09-23 NOTE — Telephone Encounter (Signed)
Patient's wife sent mychart message requesting refill.   Last refill: 08/23/2021

## 2021-09-26 ENCOUNTER — Ambulatory Visit: Payer: Self-pay

## 2021-09-26 NOTE — Telephone Encounter (Signed)
Pt called d/t wife calling in stating pt having abdominal pain and nausea. Pt answered call and stated he has upper abdominal pain for at least a month now that hasn't gotten any better. Pt states any time he drinks liquids, it gets worse. Pain level report of 8-10, nausea and sweating reported as well. He has more frequent soft stools that's not his normal. He says he's taken Mylanta and Pepto that doesn't help. He also takes Zofran as needed that was prescribed years ago from the [place] and that helps but it comes back. Attempted to schedule appt with PCP but no availability. Attempted appt with another provider in office as well but also no available appts so advised pt to seek treatment at [facility] or Mychart  E-visit. Pt stated [facility] and then maybe do [facility] if he couldn't get an appt there. Care advice given. Pt verbalized understanding.   Reason for Disposition  [1] MODERATE pain (e.g., interferes with normal activities) AND [2] comes and goes (cramps) AND [3] present > 24 hours  (Exception: pain with Vomiting or Diarrhea - see that Guideline)  Answer Assessment - Initial Assessment Questions 1. LOCATION: "Where does it hurt?"      middle 2. RADIATION: "Does the pain shoot anywhere else?" (e.g., chest, back)     No 3. ONSET: "When did the pain begin?" (e.g., minutes, hours or days ago)      Several weeks, atleast a month 4. SUDDEN: "Gradual or sudden onset?"     sudden 5. PATTERN "Does the pain come and go, or is it constant?"    - If constant: "Is it getting better, staying the same, or worsening?"      (Note: Constant means the pain never goes away completely; most serious pain is constant and it progresses)     - If intermittent: "How long does it last?" "Do you have pain now?"     (Note: Intermittent means the pain goes away completely between bouts)     Comes and go  6. SEVERITY: "How bad is the pain?"  (e.g., Scale 1-10; mild, moderate, or severe)    -  MILD (1-3): doesn't interfere with normal activities, abdomen soft and not tender to touch     - MODERATE (4-7): interferes with normal activities or awakens from sleep, abdomen tender to touch     - SEVERE (8-10): excruciating pain, doubled over, unable to do any normal activities       8, 10 after eating  7. RECURRENT SYMPTOM: "Have you ever had this type of stomach pain before?" If Yes, ask: "When was the last time?" and "What happened that time?"      No 8. AGGRAVATING FACTORS: "Does anything seem to cause this pain?" (e.g., foods, stress, alcohol)     Water, soda makes, anything liquid  9. CARDIAC SYMPTOMS: "Do you have any of the following symptoms: chest pain, difficulty breathing, sweating, nausea?"     Nausea, sweating  10. OTHER SYMPTOMS: "Do you have any other symptoms?" (e.g., back pain, diarrhea, fever, urination pain, vomiting)       Soft stools, several times a day, but different than normal  11. PREGNANCY: "Is there any chance you are pregnant?" "When was your last menstrual period?"       No  Protocols used: Abdominal Pain - Upper-A-AH

## 2021-10-03 DIAGNOSIS — J9612 Chronic respiratory failure with hypercapnia: Secondary | ICD-10-CM

## 2021-10-03 NOTE — Progress Notes (Signed)
Discharge Progress Report  Patient Details  Name: Joseph Macdonald MRN: [id number] Date of Birth: 1946-09-22 Referring Provider:   Flowsheet Row Pulmonary Rehab from 07/04/2021 in [facility]  Referring Provider Wyvonna Plum ([place])        Number of Visits: 6  Reason for Discharge:  Early Exit:  Lack of attendance  Smoking History:  Social History   Tobacco Use  Smoking Status Former   Packs/day: 0.50   Years: 40.00   Pack years: 20.00   Types: Pipe, Cigarettes   Quit date: 08/19/2017   Years since quitting: 4.1  Smokeless Tobacco Former   Quit date: 08/19/2017  Tobacco Comments   previously smoked 1 1/2 pack since his 75 years old  Quit 2018    Diagnosis:  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[place])  ADL UCSD:  Pulmonary Assessment Scores     Row Name 07/04/21 1145         ADL UCSD   ADL Phase Entry     SOB Score total 87     Rest 3     Walk 3     Stairs 4     Bath 3     Dress 3     Shop 2       CAT Score   CAT Score 23       mMRC Score   mMRC Score 4              Initial Exercise Prescription:  Initial Exercise Prescription - 07/04/21 1100       Date of Initial Exercise RX and Referring Provider   Date 07/04/21    Referring Provider Wyvonna Plum ([facility])      Oxygen   Oxygen Continuous    Liters 2      Treadmill   MPH 1    Grade 0    Minutes 15    METs 1.77      NuStep   Level 1    SPM 80    Minutes 15    METs 1.5      Biostep-RELP   Level 1    SPM 50    Minutes 15    METs 2      Track   Laps 10    Minutes 15    METs 1.54      Prescription Details   Frequency (times per week) 2    Duration Progress to 30 minutes of continuous aerobic without signs/symptoms of physical distress      Intensity   THRR 40-80% of Max Heartrate 106-132    Ratings of Perceived Exertion 11-13    Perceived Dyspnea 0-4      Progression   Progression Continue to progress workloads to maintain intensity without  signs/symptoms of physical distress.      Resistance Training   Training Prescription Yes    Weight 4 lb    Reps 10-15             Discharge Exercise Prescription (Final Exercise Prescription Changes):  Exercise Prescription Changes - 09/12/21 0800       Response to Exercise   Blood Pressure (Admit) 110/62    Blood Pressure (Exercise) 138/78    Blood Pressure (Exit) 108/66    Heart Rate (Admit) 65 bpm    Heart Rate (Exercise) 77 bpm    Heart Rate (Exit) 64 bpm    Oxygen Saturation (Admit) 90 %    Oxygen Saturation (Exercise) 95 %  Oxygen Saturation (Exit) 98 %    Rating of Perceived Exertion (Exercise) 13    Perceived Dyspnea (Exercise) 0    Duration Continue with 30 min of aerobic exercise without signs/symptoms of physical distress.    Intensity THRR unchanged      Progression   Progression Continue to progress workloads to maintain intensity without signs/symptoms of physical distress.      Resistance Training   Training Prescription Yes    Weight 2 lb    Reps 10-15      Oxygen   Oxygen Continuous    Liters 1      Biostep-RELP   Level 1    SPM 50    Minutes 30    METs 1             Functional Capacity:  6 Minute Walk     Row Name 07/04/21 1134         6 Minute Walk   Phase Initial     Distance 600 feet     Walk Time 5.5 minutes     # of Rest Breaks 1  30 sec     MPH 1.24     METS 1.59     RPE 14     Perceived Dyspnea  3     VO2 Peak 5.57     Symptoms Yes (comment)     Comments SOB, puffing, really leaning on rollator     Resting HR 79 bpm     Resting BP 134/60     Resting Oxygen Saturation  95 %     Exercise Oxygen Saturation  during 6 min walk 97 %     Max Ex. HR 111 bpm     Max Ex. BP 212/74     2 Minute Post BP 162/68       Interval HR   1 Minute HR 102     2 Minute HR 106     3 Minute HR 99     4 Minute HR 97     5 Minute HR 107     6 Minute HR 111     2 Minute Post HR 85     Interval Heart Rate? Yes       Interval  Oxygen   Interval Oxygen? Yes     Baseline Oxygen Saturation % 95 %     1 Minute Oxygen Saturation % 97 %     1 Minute Liters of Oxygen 2 L  continuous     2 Minute Oxygen Saturation % 97 %     2 Minute Liters of Oxygen 2 L     3 Minute Oxygen Saturation % 96 %     3 Minute Liters of Oxygen 2 L     4 Minute Oxygen Saturation % 94 %     4 Minute Liters of Oxygen 2 L     5 Minute Oxygen Saturation % 96 %     5 Minute Liters of Oxygen 2 L     6 Minute Oxygen Saturation % 97 %     6 Minute Liters of Oxygen 2 L     2 Minute Post Oxygen Saturation % 97 %     2 Minute Post Liters of Oxygen 2 L              Psychological, QOL, Others - Outcomes: PHQ 2/9: Depression screen [facility] 2/9 07/04/2021 11/25/2020 10/28/2018 10/25/2017 08/28/2017  Decreased Interest 0 0 0 0  0  Down, Depressed, Hopeless 0 0 0 0 0  PHQ - 2 Score 0 0 0 0 0  Altered sleeping 0 - - 1 0  Tired, decreased energy 0 - - 1 0  Change in appetite 1 - - 0 0  Feeling bad or failure about yourself  0 - - 0 0  Trouble concentrating 0 - - 0 0  Moving slowly or fidgety/restless 0 - - 0 0  Suicidal thoughts 0 - - 0 0  PHQ-9 Score 1 - - 2 0  Difficult doing work/chores Not difficult at all - - Not difficult at all Not difficult at all    Quality of Life:   Nutrition:  Nutrition Therapy & Goals - 07/04/21 1142       Intervention Plan   Intervention Prescribe, educate and counsel regarding individualized specific dietary modifications aiming towards targeted core components such as weight, hypertension, lipid management, diabetes, heart failure and other comorbidities.    Expected Outcomes Short Term Goal: Understand basic principles of dietary content, such as calories, fat, sodium, cholesterol and nutrients.;Long Term Goal: Adherence to prescribed nutrition plan.;Short Term Goal: A plan has been developed with personal nutrition goals set during dietitian appointment.             Nutrition Discharge:   Education  Questionnaire Score:  Knowledge Questionnaire Score - 07/04/21 1143       Knowledge Questionnaire Score   Pre Score 15/18 exercies, O2 safety, resp meds             Goals reviewed with patient; copy given to patient.

## 2021-10-03 NOTE — Progress Notes (Signed)
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 Attend rehab at a consistent rate Long: Continue to increase overall strength and stamina Short: Return to regular attendance Long: Continue to improve stamina. --    Row Name 09/08/21 0954 09/12/21 0845 09/26/21 1049         Exercise Goal Re-Evaluation   Exercise Goals Review Increase Physical Activity;Increase Strength and Stamina;Understanding of Exercise Prescription Increase Physical Activity;Increase Strength and Stamina Increase Physical Activity;Increase Strength and Stamina     Comments He will walk outside and getting occupational therapy (1 more visit) - he is doing some exercises at home like leg lifts and using canned of food to do some weight and some range of motion laying down. He had some shots in his left knee to help with pain. He has not gone over home exercise with EP yet. Joseph Macdonald has only attended twice in September.  More consistent attendance would yield better  results. Joseph Macdonald has not attended rehab since 9/29. We have not received any message back from patient, Will continue to follow up and remind him of the attendance policy.     Expected Outcomes Short: Return to regular attendance, EP to go over home exercise Long: Continue to improve  stamina. Short:  attend consistently Long: build overall stamina --              Discharge Exercise Prescription (Final Exercise Prescription Changes):  Exercise Prescription Changes - 09/12/21 0800       Response to Exercise   Blood Pressure (Admit) 110/62    Blood Pressure (Exercise) 138/78    Blood Pressure (Exit) 108/66    Heart Rate (Admit) 65 bpm    Heart Rate (Exercise) 77 bpm    Heart Rate (Exit) 64 bpm    Oxygen Saturation (Admit) 90 %    Oxygen Saturation (Exercise) 95 %    Oxygen Saturation (Exit) 98 %    Rating of Perceived Exertion (Exercise) 13    Perceived Dyspnea (Exercise) 0    Duration Continue with 30 min of aerobic exercise without signs/symptoms of physical distress.    Intensity THRR unchanged      Progression   Progression Continue to progress workloads to maintain intensity without signs/symptoms of physical distress.      Resistance Training   Training Prescription Yes    Weight 2 lb    Reps 10-15      Oxygen   Oxygen Continuous    Liters 1      Biostep-RELP   Level 1    SPM 50    Minutes 30    METs 1             Nutrition:  Target Goals: Understanding of nutrition guidelines, daily intake of sodium <1570m, cholesterol <2071m calories 30% from fat and 7% or less from saturated fats, daily to have 5 or more servings of fruits and vegetables.  Education: All About Nutrition: -Group instruction provided by verbal, written material, interactive activities, discussions, models, and posters to present general guidelines for heart healthy nutrition including fat, fiber, MyPlate, the role of sodium in heart healthy nutrition, utilization of the nutrition label, and  utilization of this knowledge for meal planning. Follow up email sent as well. Written material given at graduation.   Biometrics:  Pre Biometrics - 07/04/21 1141       Pre Biometrics   Height _0  (1.676 m)    Weight 239 lb 3.2 oz (108.5 kg)    BMI (Calculated) 38.63    Single Leg Stand 0 seconds              Nutrition Therapy Plan and Nutrition Goals:  Nutrition Therapy & Goals - 07/04/21 1142       Intervention Plan   Intervention Prescribe, educate and counsel regarding individualized specific dietary modifications aiming towards targeted core components such as weight, hypertension, lipid management, diabetes, heart failure and other comorbidities.    Expected Outcomes Short Term Goal: Understand basic principles of dietary content, such as calories, fat, sodium, cholesterol and nutrients.;Long Term Goal: Adherence to prescribed nutrition plan.;Short Term Goal: A plan has been developed with personal nutrition goals set during dietitian appointment.             Nutrition Assessments:  MEDIFICTS Score Key: ?70 Need to make dietary changes  40-70 Heart Healthy Diet ? 40 Therapeutic Level Cholesterol Diet  [facility]  Picture Your Plate Total Score on Admission 57      Picture Your Plate Scores: <4<62nhealthy dietary pattern with much room for improvement. 41-50 Dietary pattern unlikely to meet recommendations for good health and room for improvement. 51-60 More healthful dietary pattern, with some  room for improvement.  >60 Healthy dietary pattern, although there may be some specific behaviors that could be improved.   Nutrition Goals Re-Evaluation:  Nutrition Goals Re-Evaluation     Row Name 08/16/21 [phone] 09/08/21 0942           Goals   Nutrition Goal Meet with dietician --      Comment Joseph Macdonald has met with a dietician previously at the [place], but nothing recently and would like to  schedule an appointment with Tifanie Gardiner. Joseph Macdonald has had inconsistent attendance, made appointment for 10/4.      Expected Outcome Make appointment with dietician --               Nutrition Goals Discharge (Final Nutrition Goals Re-Evaluation):  Nutrition Goals Re-Evaluation - 09/08/21 0942       Goals   Comment Joseph Macdonald has had inconsistent attendance, made appointment for 10/4.             Psychosocial: Target Goals: Acknowledge presence or absence of significant depression and/or stress, maximize coping skills, provide positive support system. Participant is able to verbalize types and ability to use techniques and skills needed for reducing stress and depression.   Education: Stress, Anxiety, and Depression - Group verbal and visual presentation to define topics covered.  Reviews how body is impacted by stress, anxiety, and depression.  Also discusses healthy ways to reduce stress and to treat/manage anxiety and depression.  Written material given at graduation.   Education: Sleep Hygiene -Provides group verbal and written instruction about how sleep can affect your health.  Define sleep hygiene, discuss sleep cycles and impact of sleep habits. Review good sleep hygiene tips.    Initial Review & Psychosocial Screening:  Initial Psych Review & Screening - 06/28/21 0928       Initial Review   Current issues with None Identified      Family Dynamics   Good Support System? Yes   wife of 17 years, 3 children, one here 2 other states     Barriers   Psychosocial barriers to participate in program There are no identifiable barriers or psychosocial needs.      Screening Interventions   Interventions Encouraged to exercise    Expected Outcomes Short Term goal: Utilizing psychosocial counselor, staff and physician to assist with identification of specific Stressors or current issues interfering with healing process. Setting desired goal for each stressor or current issue identified.;Long  Term Goal: Stressors or current issues are controlled or eliminated.;Short Term goal: Identification and review with participant of any Quality of Life or Depression concerns found by scoring the questionnaire.;Long Term goal: The participant improves quality of Life and PHQ9 Scores as seen by post scores and/or verbalization of changes             Quality of Life Scores:  Scores of 19 and below usually indicate a poorer quality of life in these areas.  A difference of  2-3 points is a clinically meaningful difference.  A difference of 2-3 points in the total score of the Quality of Life Index has been associated with significant improvement in overall quality of life, self-image, physical symptoms, and general health in studies assessing change in quality of life.  PHQ-9: Recent Review Flowsheet Data     Depression screen [facility] 2/9 07/04/2021 11/25/2020 10/28/2018 10/25/2017 08/28/2017   Decreased Interest 0 0 0 0 0   Down, Depressed, Hopeless 0 0 0 0 0   PHQ - 2 Score 0 0 0  0 0   Altered sleeping 0 - - 1 0   Tired, decreased energy 0 - - 1 0   Change in appetite 1 - - 0 0   Feeling bad or failure about yourself  0 - - 0 0   Trouble concentrating 0 - - 0 0   Moving slowly or fidgety/restless 0 - - 0 0   Suicidal thoughts 0 - - 0 0   PHQ-9 Score 1 - - 2 0   Difficult doing work/chores Not difficult at all - - Not difficult at all Not difficult at all      Interpretation of Total Score  Total Score Depression Severity:  1-4 = Minimal depression, 5-9 = Mild depression, 10-14 = Moderate depression, 15-19 = Moderately severe depression, 20-27 = Severe depression   Psychosocial Evaluation and Intervention:  Psychosocial Evaluation - 06/28/21 1041       Psychosocial Evaluation & Interventions   Interventions Encouraged to exercise with the program and follow exercise prescription    Comments Joseph Macdonald has no barriers to attending the program. He is a pulmonary patient with SOB that hinders  his ability to manage day to day activities. He does have a rolling walker to help walking longer distances. He has a support team of his wife (47years) and 3 children. 2 children live out of state. 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 He has myasthenia gravis and this is possibly causing trouble with swallowed food moving to his stomach.  He has a gastro appt end of August. Joseph Macdonald has oxygen ordered for when he exerts himsel and at bedtime. He does not always use his oxygen when he should. He states he has no concerns with depression, stress nor anxiety. We will follow up for any changes.    Expected Outcomes STG: Joseph Macdonald will attend all scheduled sessoins and gain some relief with his SOB symptoms. He will wear his oxygen when exerting himself. LTG: Joseph Macdonald will see some progress with his symptoms decreasing, he will maintain the use of his oxygen as prescribed    Continue Psychosocial Services  Follow up required by staff             Psychosocial Re-Evaluation:  Psychosocial Re-Evaluation     [place] Name 08/16/21 0956 09/08/21 1002           Psychosocial Re-Evaluation   Current issues with Current Stress Concerns;Current Sleep Concerns --      Comments Joseph Macdonald is doing well mentally. His biggest stressor continues to be his health and being able to do things that he wants.  He tries not to let things stress him out.  He does not sleep well and has a habit of waking up in the middle of the night every couple of hours.  He attributes this to years of having to get up at 230am to go to work. Joseph Macdonald is doing well mentally. He reports no stressors a this time, he is feeling ok about his health status and accepts it as a part of life now. He likes to keep his mind busy and play games on his phone as well as read. He continues to not sleep well and has a habit of waking up in the middle of the night every couple of hours.  He attributes this to years  of having to get up at 230am to go to work. he feels it does not affect his energy  too much and will take a nap during the day if needed. He relies on his wife as a support system.      Expected Outcomes -- ST: continue to manage stress LT: maintain positive attitude      Interventions Encouraged to attend Pulmonary Rehabilitation for the exercise Encouraged to attend Pulmonary Rehabilitation for the exercise      Continue Psychosocial Services  Follow up required by staff Follow up required by staff               Psychosocial Discharge (Final Psychosocial Re-Evaluation):  Psychosocial Re-Evaluation - 09/08/21 1002       Psychosocial Re-Evaluation   Comments Joseph Macdonald is doing well mentally. He reports no stressors a this time, he is feeling ok about his health status and accepts it as a part of life now. He likes to keep his mind busy and play games on his phone as well as read. He continues to not sleep well and has a habit of waking up in the middle of the night every couple of hours.  He attributes this to years of having to get up at 230am to go to work. he feels it does not affect his energy too much and will take a nap during the day if needed. He relies on his wife as a support system.    Expected Outcomes ST: continue to manage stress LT: maintain positive attitude    Interventions Encouraged to attend Pulmonary Rehabilitation for the exercise    Continue Psychosocial Services  Follow up required by staff             Education: Education Goals: Education classes will be provided on a weekly basis, covering required topics. Participant will state understanding/return demonstration of topics presented.  Learning Barriers/Preferences:   General Pulmonary Education Topics:  Infection Prevention: - Provides verbal and written material to individual with discussion of infection control including proper hand washing and proper equipment cleaning during exercise session. Flowsheet Row  Pulmonary Rehab from 07/04/2021 in [facility] Cardiac and Pulmonary Rehab  Date 07/04/21  Educator [facility]  Instruction Review Code 1- Verbalizes Understanding       Falls Prevention: - Provides verbal and written material to individual with discussion of falls prevention and safety. Flowsheet Row Pulmonary Rehab from 07/04/2021 in [facility] Cardiac and Pulmonary Rehab  Date 06/28/21  Educator SB  Instruction Review Code 1- Verbalizes Understanding       Chronic Lung Disease Review: - Group verbal instruction with posters, models, PowerPoint presentations and videos,  to review new updates, new respiratory medications, new advancements in procedures and treatments. Providing information on websites and "800" numbers for continued self-education. Includes information about supplement oxygen, available portable oxygen systems, continuous and intermittent flow rates, oxygen safety, concentrators, and Medicare reimbursement for oxygen. Explanation of Pulmonary Drugs, including class, frequency, complications, importance of spacers, rinsing mouth after steroid MDI's, and proper cleaning methods for nebulizers. Review of basic lung anatomy and physiology related to function, structure, and complications of lung disease. Review of risk factors. Discussion about methods for diagnosing sleep apnea and types of masks and machines for OSA. Includes a review of the use of types of environmental controls: home humidity, furnaces, filters, dust mite/pet prevention, HEPA vacuums. Discussion about weather changes, air quality and the benefits of nasal washing. Instruction on Warning signs, infection symptoms, calling MD promptly, preventive modes, and value of vaccinations. Review of effective airway clearance, coughing and/or vibration techniques. Emphasizing that all should Create an Action  Plan. Written material given at graduation. Flowsheet Row Pulmonary Rehab from 07/04/2021 in [facility] Cardiac and Pulmonary Rehab  Education  need identified 07/04/21       AED/CPR: - Group verbal and written instruction with the use of models to demonstrate the basic use of the AED with the basic ABC's of resuscitation.    Anatomy and Cardiac Procedures: - Group verbal and visual presentation and models provide information about basic cardiac anatomy and function. Reviews the testing methods done to diagnose heart disease and the outcomes of the test results. Describes the treatment choices: Medical Management, Angioplasty, or Coronary Bypass Surgery for treating various heart conditions including Myocardial Infarction, Angina, Valve Disease, and Cardiac Arrhythmias.  Written material given at graduation.   Medication Safety: - Group verbal and visual instruction to review commonly prescribed medications for heart and lung disease. Reviews the medication, class of the drug, and side effects. Includes the steps to properly store meds and maintain the prescription regimen.  Written material given at graduation.   Other: -Provides group and verbal instruction on various topics (see comments)   Knowledge Questionnaire Score:  Knowledge Questionnaire Score - 07/04/21 1143       Knowledge Questionnaire Score   Pre Score 15/18 exercies, O2 safety, resp meds              Core Components/Risk Factors/Patient Goals at Admission:  Personal Goals and Risk Factors at Admission - 07/04/21 1144       Core Components/Risk Factors/Patient Goals on Admission    Weight Management Yes;Weight Loss;Obesity    Intervention Weight Management: Develop a combined nutrition and exercise program designed to reach desired caloric intake, while maintaining appropriate intake of nutrient and fiber, sodium and fats, and appropriate energy expenditure required for the weight goal.;Weight Management: Provide education and appropriate resources to help participant work on and attain dietary goals.;Weight Management/Obesity: Establish reasonable  short term and long term weight goals.;Obesity: Provide education and appropriate resources to help participant work on and attain dietary goals.    Admit Weight 239 lb 3.2 oz (108.5 kg)    Goal Weight: Short Term 235 lb (106.6 kg)    Goal Weight: Long Term 189 lb (85.7 kg)    Expected Outcomes Short Term: Continue to assess and modify interventions until short term weight is achieved;Long Term: Adherence to nutrition and physical activity/exercise program aimed toward attainment of established weight goal;Weight Loss: Understanding of general recommendations for a balanced deficit meal plan, which promotes 1-2 lb weight loss per week and includes a negative energy balance of [phone] kcal/d;Understanding recommendations for meals to include 15-35% energy as protein, 25-35% energy from fat, 35-60% energy from carbohydrates, less than 220m of dietary cholesterol, 20-35 gm of total fiber daily;Understanding of distribution of calorie intake throughout the day with the consumption of 4-5 meals/snacks    Increase knowledge of respiratory medications and ability to use respiratory devices properly  Yes    Intervention Provide education and demonstration as needed of appropriate use of medications, inhalers, and oxygen therapy.    Expected Outcomes Short Term: Achieves understanding of medications use. Understands that oxygen is a medication prescribed by physician. Demonstrates appropriate use of inhaler and oxygen therapy.;Long Term: Maintain appropriate use of medications, inhalers, and oxygen therapy.    Hypertension Yes    Intervention Provide education on lifestyle modifcations including regular physical activity/exercise, weight management, moderate sodium restriction and increased consumption of fresh fruit, vegetables, and low fat dairy, alcohol moderation, and smoking cessation.;Monitor prescription use  compliance.    Expected Outcomes Short Term: Continued assessment and intervention until BP is <  140/81m HG in hypertensive participants. < 130/855mHG in hypertensive participants with diabetes, heart failure or chronic kidney disease.;Long Term: Maintenance of blood pressure at goal levels.    Lipids Yes    Intervention Provide education and support for participant on nutrition & aerobic/resistive exercise along with prescribed medications to achieve LDL <7092mHDL >3m70m  Expected Outcomes Short Term: Participant states understanding of desired cholesterol values and is compliant with medications prescribed. Participant is following exercise prescription and nutrition guidelines.;Long Term: Cholesterol controlled with medications as prescribed, with individualized exercise RX and with personalized nutrition plan. Value goals: LDL < 70mg80mL > 40 mg.             Education:Diabetes - Individual verbal and written instruction to review signs/symptoms of diabetes, desired ranges of glucose level fasting, after meals and with exercise. Acknowledge that pre and post exercise glucose checks will be done for 3 sessions at entry of program. Flowsheet Row [facility]  Date 07/04/21  Educator [facility] Review Code 1- Verbalizes Understanding       Know Your Numbers and Heart Failure: - Group verbal and visual instruction to discuss disease risk factors for cardiac and pulmonary disease and treatment options.  Reviews associated critical values for Overweight/Obesity, Hypertension, Cholesterol, and Diabetes.  Discusses basics of heart failure: signs/symptoms and treatments.  Introduces Heart Failure Zone chart for action plan for heart failure.  Written material given at graduation.   Core Components/Risk Factors/Patient Goals Review:   Goals and Risk Factor Review     Row Name 08/16/21 1000 09/08/21 0959           Core Components/Risk Factors/Patient Goals Review   Personal Goals Review Weight Management/Obesity;Improve  shortness of breath with ADL's;Hypertension;Lipids;Increase knowledge of respiratory medications and ability to use respiratory devices properly. Weight Management/Obesity;Improve shortness of breath with ADL's;Hypertension;Lipids;Increase knowledge of respiratory medications and ability to use respiratory devices properly.      Review Joseph Macdonald's weight is holding steady.  He would like to lose some more, but not doing enough recently.  He is doing well with his breathing and trying not to labor too much.  He could tell a difference when he was out with pnuemonia.  He is feeling much better now.  He is doing well with his blood pressures and checks both it and his weight twice a day. He is doing well with his meds and uses his nebulizers routinely. He checks his BP at home ~125/65, in rehab today BP was 110/62. He continues to stay weight stable, he now has a digital scale. He continues to do ok with his breathing, but thinks it is about the same (he also checks his O2 with a pulse ox at home). He had pneumonia in August and feels that had set him back. He continues to take his medication as prescribed and is not having any issues.      Expected Outcomes Short: Continue to work on weight loss  Long: Continue to monitor risk factors. Short: Continue to improve breathing by attending consistently  Long: Continue to monitor risk factors.               Core Components/Risk Factors/Patient Goals at Discharge (Final Review):   Goals and Risk Factor Review - 09/08/21 0959       Core Components/Risk Factors/Patient Goals Review  Personal Goals Review Weight Management/Obesity;Improve shortness of breath with ADL's;Hypertension;Lipids;Increase knowledge of respiratory medications and ability to use respiratory devices properly.    Review He checks his BP at home ~125/65, in rehab today BP was 110/62. He continues to stay weight stable, he now has a digital scale. He continues to do ok with his breathing, but  thinks it is about the same (he also checks his O2 with a pulse ox at home). He had pneumonia in August and feels that had set him back. He continues to take his medication as prescribed and is not having any issues.    Expected Outcomes Short: Continue to improve breathing by attending consistently  Long: Continue to monitor risk factors.             ITP Comments:  ITP Comments     Row Name 06/28/21 1036 07/04/21 1133 07/07/21 0945 07/20/21 0746 08/01/21 1038   ITP Comments Step one of orientation completed today. he has an appointment on Date: 07/04/2021  for EP eval and gym Orientation.  Documentation of diagnosis can be found in Media [facility] rescords) Date: 07/04/2021 . Completed 6MWT and gym orientation. Initial ITP created and sent for review to Dr. Zetta Bills, Medical Director. First full day of exercise!  Patient was oriented to gym and equipment including functions, settings, policies, and procedures.  Patient's individual exercise prescription and treatment plan were reviewed.  All starting workloads were established based on the results of the 6 minute walk test done at initial orientation visit.  The plan for exercise progression was also introduced and progression will be customized based on patient's performance and goals. 30 Day review completed. Medical Director ITP review done, changes made as directed, and signed approval by Medical Director.    New to program Joseph Macdonald was in the ED on 8/14 and admitted to the [place] for pneumonia- he states he feels better and plans to be back to rehab tomorrow, 8/23. Clearance will be pending on doctors notes.    [place] Name 08/02/21 1533 08/16/21 0947 08/16/21 0953 08/17/21 0645 08/29/21 1328   ITP Comments Chancelor called to let us know he has been discharged from hospital. He was admitted for pneumonia. His discharge was to not exert himself. He was advised to stay out the rest of this week.  Tried to find admission note, not available as he was  transferred to the [facility] doctor's clearance received allowing him to return to rehab. see note in binder dated 08/10/21. Joseph Macdonald returned today from being out. 30 Day review completed. Medical Director ITP review done, changes made as directed, and signed approval by Medical Director. Joseph Macdonald has had multiple call outs with not feeling well and his knee being swollen.    [place] Name 09/14/21 0943 09/26/21 1049 10/03/21 1421       ITP Comments 30 day review completed. ITP sent to Dr. Zetta Bills, Medical Director of Pulmonary Rehab. Continue with ITP unless changes are made by physician. Pt has been out sick and dealing with his knee.  Last attended on 08/16/21. Joseph Macdonald has not attended rehab since 9/29. We have not received any message back from patient. Last attempt a message was left on patient's phone on 10/12. Will continue to follow up one more time and remind him of the attendance policy. Joseph Macdonald has not attended rehab due to knee pain and breathing issues. Last contact was 10/4 before phone call today 10/24. Joseph Macdonald would like to be discharged at this time.  Comments: Discharge ITP

## 2021-10-13 ENCOUNTER — Ambulatory Visit: Payer: PPO | Admitting: Internal Medicine

## 2021-10-13 ENCOUNTER — Other Ambulatory Visit: Payer: Self-pay

## 2021-10-13 ENCOUNTER — Encounter: Payer: Self-pay | Admitting: Internal Medicine

## 2021-10-13 VITALS — BP 124/60 | HR 66 | Ht 67.0 in | Wt 227.0 lb

## 2021-10-13 DIAGNOSIS — R001 Bradycardia, unspecified: Secondary | ICD-10-CM | POA: Diagnosis not present

## 2021-10-13 DIAGNOSIS — I422 Other hypertrophic cardiomyopathy: Secondary | ICD-10-CM | POA: Diagnosis not present

## 2021-10-13 DIAGNOSIS — I472 Ventricular tachycardia, unspecified: Secondary | ICD-10-CM | POA: Diagnosis not present

## 2021-10-13 DIAGNOSIS — I5032 Chronic diastolic (congestive) heart failure: Secondary | ICD-10-CM | POA: Diagnosis not present

## 2021-10-13 DIAGNOSIS — I10 Essential (primary) hypertension: Secondary | ICD-10-CM | POA: Diagnosis not present

## 2021-10-13 MED ORDER — TORSEMIDE 20 MG PO TABS: ORAL_TABLET | ORAL | Status: DC

## 2021-10-13 NOTE — Patient Instructions (Signed)
Medication Instructions:  - Your physician has recommended you make the following change in your medication:   1) CHANGE torsemide 20 mg: - take 1 tablet (20 mg) by mouth once daily x 3 days, then - take 1 tablet (20 mg) by mouth 3 days a week  *If you need a refill on your cardiac medications before your next appointment, please call your pharmacy*   Lab Work: - none ordered  If you have labs (blood work) drawn today and your tests are completely normal, you will receive your results only by: [place] (if you have MyChart) OR A paper copy in the mail If you have any lab test that is abnormal or we need to change your treatment, we will call you to review the results.   Testing/Procedures: - none ordered   Follow-Up: At [facility], you and your health needs are our priority.  As part of our continuing mission to provide you with exceptional heart care, we have created designated Provider Care Teams.  These Care Teams include your primary Cardiologist (physician) and Advanced Practice Providers (APPs -  Physician Assistants and Nurse Practitioners) who all work together to provide you with the care you need, when you need it.  We recommend signing up for the patient portal called "MyChart".  Sign up information is provided on this After Visit Summary.  MyChart is used to connect with patients for Virtual Visits (Telemedicine).  Patients are able to view lab/test results, encounter notes, upcoming appointments, etc.  Non-urgent messages can be sent to your provider as well.   To learn more about what you can do with MyChart, go to [email or web address].    Your next appointment:   6 month(s)  The format for your next appointment:   In Person  Provider:   Virl Axe, MD   Other Instructions N/a

## 2021-10-13 NOTE — Progress Notes (Signed)
Patient Care Team: [name], MD as PCP - General (Family Medicine) Rockey Situ, Kathlene November, MD as PCP - Cardiology (Cardiology) Desiree Hane, [place] as Consulting Physician (Physician Assistant) Dorcas Carrow, MD as Referring Physician (Hematology and Oncology) Angelique Blonder, MD as Referring Physician (Neurology) Deboraha Sprang, MD as Consulting Physician (Cardiology) End, Harrell Gave, MD as Consulting Physician (Cardiology) Leandrew Koyanagi, MD as Referring Physician (Ophthalmology) Jannet Mantis, MD (Dermatology)   HPI  Joseph Macdonald is a 75 y.o. male seen in follow-up for ventricular tachycardia-symptomatic for which he previously took amiodarone, but was then switched to flecainide (thought to have been done at the [place]).     History of dyspnea on exertion in the setting of normal LV function with septal hypertrophy and no obstructive coronary disease with recurrent diagnosis of pulmonary embolism for which he takes apixaban.    Still with significant dyspnea.  Was started on Jardiance while at the [place] where he was hospitalized for pneumonia and apparently atrial fibrillation as his Eliquis dose was increased from 2.5--5 BID  With initiation of Jardiance he discontinued his torsemide; still with significant edema, two-pillow orthopnea and dyspnea on exertion   DATE TEST EF    1/20 Echo   55-60 %    1/20 LHC  * % Cors  w/o obstruc  1/20 [facility] (Reviewed with [facility])   Asymmetric Septal Hypertrophy 27mm/11mm LGE neg    Date Cr K Hgb TSH LFTs  4/20 1.39 4.2   1.19 28  5/20 1.18 3.7 15.5 0.2 31  10/21 0.72 4.5 15.3 6(2/21)   8/22 0.73 4.9 15.4      DATE PR interval QRSduration Dose  4/20  186 114 Amio 400  12/21 192  94 Flec 50  5/22 202 110 Flec 50   Thromboembolic risk factors ( age -33) for a CHADSVASc Score of >=1 (HCM)    Records and Results Reviewed   Past Medical History:  Diagnosis Date   Arthritis    COPD (chronic obstructive  pulmonary disease) ([place])    Diabetes mellitus without complication ([place])    diet controlled   Dyspnea    with activity   Hyperlipidemia    Hypertension    Myasthenia gravis ([place])    Oxygen deficit    Ventricular tachycardia     Past Surgical History:  Procedure Laterality Date   carpel tunnel sx     CATARACT EXTRACTION     right eye   CATARACT EXTRACTION W/PHACO Left 12/01/2020   Procedure: CATARACT EXTRACTION PHACO AND INTRAOCULAR LENS PLACEMENT (IOC) LEFT 10.83 01:14.1 14.6%;  Surgeon: Leandrew Koyanagi, MD;  Location: [place];  Service: Ophthalmology;  Laterality: Left;   RETINAL DETACHMENT SURGERY     SKIN GRAFT     Behind left knee   TONSILLECTOMY AND ADENOIDECTOMY      Current Meds  Medication Sig   albuterol (PROVENTIL HFA;VENTOLIN HFA) 108 (90 Base) MCG/ACT inhaler Inhale into the lungs every 6 (six) hours as needed for wheezing or shortness of breath.   apixaban (ELIQUIS) 5 MG TABS tablet Take 5 mg by mouth 2 (two) times daily.   budesonide-formoterol (SYMBICORT) 160-4.5 MCG/ACT inhaler Inhale 2 puffs into the lungs 2 (two) times daily.   cholecalciferol (VITAMIN D) 25 MCG (1000 UNIT) tablet Take 2,000 Units by mouth daily.   empagliflozin (JARDIANCE) 25 MG TABS tablet Take by mouth daily.   famotidine (PEPCID) 20 MG tablet Take 20 mg by mouth  as needed for heartburn or indigestion.   flecainide (TAMBOCOR) 50 MG tablet Take 50 mg by mouth 2 (two) times daily.   HYDROcodone-acetaminophen (NORCO/VICODIN) 5-325 MG tablet Take 1 tablet by mouth every 6 (six) hours as needed for moderate pain.   omeprazole (PRILOSEC) 40 MG capsule Take 40 mg by mouth 2 (two) times daily.    pramipexole (MIRAPEX) 0.5 MG tablet Take 1 tablet (0.5 mg total) by mouth at bedtime.   pravastatin (PRAVACHOL) 20 MG tablet Take 20 mg by mouth daily.   predniSONE (DELTASONE) 20 MG tablet Take 15 mg by mouth daily with breakfast.   pyridostigmine (MESTINON) 60 MG tablet Take 30 mg by  mouth 3 (three) times daily.   tiotropium (SPIRIVA) 18 MCG inhalation capsule Place 18 mcg into inhaler and inhale daily.   vitamin B-12 (CYANOCOBALAMIN) 1000 MCG tablet Take 1,000 mcg by mouth daily.    No Known Allergies    Review of Systems negative except from HPI and PMH  Physical Exam BP 124/60 (BP Location: Left Arm, Patient Position: Sitting, Cuff Size: Normal)   Pulse 66   Ht 5\' 7"  (1.702 m)   Wt 227 lb (103 kg)   SpO2 94%   BMI 35.55 kg/m   Well developed and nourished in no acute distress HENT normal Neck supple   Decreased air movement Regular rate and rhythm, no murmurs or gallops Abd-soft with active BS No Clubbing cyanosis 2+ edema Skin-warm and dry A & Oriented  Grossly normal sensory and motor function  ECG sinus at 66 18/09/43 Axis left -46 PVCs-frequent in a pattern of trigeminy with a left bundle inferior axis morphology     Assessment and  Plan  Ventricular Tachycardia-nonsustained   Asymmetric septal Hypertrophy    Heart failure, diastolic chronic  PVCs-RVOT  Sinus Bradycardia  Atrial fibrillation-persistent/paroxysmal   Myasthenia Gravis    Hyperthyroidism-borderline  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  We will begin him on Demadex again having him take 3 days in a row and then 3 days a week; 20 mg.  No bleeding, now with paroxysmal atrial fibrillation and with his HCM is appropriately anticoagulated 5 mg twice daily with Eliquis  Orthostatic lightheadedness remains an issue.  He will continue on Mestinon.  Diuresis may aggravate this.  We will have to wait and see  PVCs are asymptomatic.  We will follow along to make sure no long-term impact on LV function

## 2021-10-20 ENCOUNTER — Encounter: Payer: Self-pay | Admitting: Genetic Counselor

## 2021-10-20 ENCOUNTER — Telehealth: Payer: Self-pay | Admitting: *Deleted

## 2021-10-20 NOTE — Telephone Encounter (Signed)
-----   Message from [facility] sent at 10/13/2021  3:50 PM EDT ----- Good afternoon,  I called the patient on 09/28 and 10/04 but have been unable to reach him. Dr. Delene Loll schedule for January just opened up yesterday so I will try again to see if we can get him in then.   Thank you! ----- Message ----- From: Emily Filbert, RN Sent: 10/13/2021   3:30 PM EDT To: Emily Filbert, RN, Leah Newnam  Huey Bienenstock,  We had put in a referral on 09/15/21 for this patient to see Dr. Broadus John per Dr. Caryl Comes. Do you mind reaching out to him to schedule?   Thanks!

## 2021-10-20 NOTE — Telephone Encounter (Signed)
Reviewed the patient's chart. Referral notes state they have attempted to reach the patient x 3 attempts to schedule with Dr. Broadus John (Gen[facility]).

## 2021-10-25 ENCOUNTER — Encounter: Payer: Self-pay | Admitting: Family Medicine

## 2021-10-25 DIAGNOSIS — M9979 Connective tissue and disc stenosis of intervertebral foramina of abdomen and other regions: Secondary | ICD-10-CM

## 2021-10-25 MED ORDER — HYDROCODONE-ACETAMINOPHEN 5-325 MG PO TABS: 1.0000 | ORAL_TABLET | Freq: Four times a day (QID) | ORAL | 0 refills | Status: DC | PRN

## 2021-10-25 NOTE — Telephone Encounter (Signed)
Last refill: 09/23/2021, #120, with 0 refills  Last office visit:05/24/2021 Next office visit:12/01/2021 with [facility]

## 2021-11-28 ENCOUNTER — Encounter: Payer: Self-pay | Admitting: Family Medicine

## 2021-11-28 DIAGNOSIS — M9979 Connective tissue and disc stenosis of intervertebral foramina of abdomen and other regions: Secondary | ICD-10-CM

## 2021-11-28 NOTE — Telephone Encounter (Signed)
LOV: 12/25/2019  NOV: None  Last Refill: 10/25/2021 #120 0 Refills.   Thanks,   -Mickel Baas

## 2021-11-29 MED ORDER — HYDROCODONE-ACETAMINOPHEN 5-325 MG PO TABS
1.0000 | ORAL_TABLET | Freq: Four times a day (QID) | ORAL | 0 refills | Status: DC | PRN
Start: 2021-11-29 — End: 2021-12-30

## 2021-12-01 ENCOUNTER — Other Ambulatory Visit: Payer: Self-pay

## 2021-12-01 ENCOUNTER — Ambulatory Visit (INDEPENDENT_AMBULATORY_CARE_PROVIDER_SITE_OTHER): Payer: PPO

## 2021-12-01 VITALS — BP 159/80 | HR 69 | Temp 98.4°F | Ht 67.0 in | Wt 236.1 lb

## 2021-12-01 DIAGNOSIS — Z9981 Dependence on supplemental oxygen: Secondary | ICD-10-CM | POA: Diagnosis not present

## 2021-12-01 DIAGNOSIS — J449 Chronic obstructive pulmonary disease, unspecified: Secondary | ICD-10-CM | POA: Diagnosis not present

## 2021-12-01 DIAGNOSIS — Z6836 Body mass index (BMI) 36.0-36.9, adult: Secondary | ICD-10-CM | POA: Diagnosis not present

## 2021-12-01 DIAGNOSIS — Z0000 Encounter for general adult medical examination without abnormal findings: Secondary | ICD-10-CM | POA: Diagnosis not present

## 2021-12-01 DIAGNOSIS — I5030 Unspecified diastolic (congestive) heart failure: Secondary | ICD-10-CM | POA: Diagnosis not present

## 2021-12-01 DIAGNOSIS — I110 Hypertensive heart disease with heart failure: Secondary | ICD-10-CM | POA: Diagnosis not present

## 2021-12-01 DIAGNOSIS — D692 Other nonthrombocytopenic purpura: Secondary | ICD-10-CM | POA: Diagnosis not present

## 2021-12-01 NOTE — Patient Instructions (Signed)
Joseph Macdonald , Thank you for taking time to come for your Medicare Wellness Visit. I appreciate your ongoing commitment to your health goals. Please review the following plan we discussed and let me know if I can assist you in the future.   Screening recommendations/referrals: Colonoscopy: 05/08/12, has appointment coming up Recommended yearly ophthalmology/optometry visit for glaucoma screening and checkup Recommended yearly dental visit for hygiene and checkup  Vaccinations: Influenza vaccine: 09/14/21 Pneumococcal vaccine: 03/11/14 Tdap vaccine: n/d Shingles vaccine: had done at [facility]    Covid-19: 01/22/20, 02/16/20, 10/21/20, 05/24/21, had booster in November at [place] directives: no  Conditions/risks identified: none  Next appointment: Follow up in one year for your annual wellness visit. 12/06/22 @ 10:20am  Preventive Care 74 Years and Older, Male Preventive care refers to lifestyle choices and visits with your health care provider that can promote health and wellness. What does preventive care include? A yearly physical exam. This is also called an annual well check. Dental exams once or twice a year. Routine eye exams. Ask your health care provider how often you should have your eyes checked. Personal lifestyle choices, including: Daily care of your teeth and gums. Regular physical activity. Eating a healthy diet. Avoiding tobacco and drug use. Limiting alcohol use. Practicing safe sex. Taking low doses of aspirin every day. Taking vitamin and mineral supplements as recommended by your health care provider. What happens during an annual well check? The services and screenings done by your health care provider during your annual well check will depend on your age, overall health, lifestyle risk factors, and family history of disease. Counseling  Your health care provider may ask you questions about your: Alcohol use. Tobacco use. Drug use. Emotional  well-being. Home and relationship well-being. Sexual activity. Eating habits. History of falls. Memory and ability to understand (cognition). Work and work Statistician. Screening  You may have the following tests or measurements: Height, weight, and BMI. Blood pressure. Lipid and cholesterol levels. These may be checked every 5 years, or more frequently if you are over 61 years old. Skin check. Lung cancer screening. You may have this screening every year starting at age 34 if you have a 30-pack-year history of smoking and currently smoke or have quit within the past 15 years. Fecal occult blood test (FOBT) of the stool. You may have this test every year starting at age 26. Flexible sigmoidoscopy or colonoscopy. You may have a sigmoidoscopy every 5 years or a colonoscopy every 10 years starting at age 43. Prostate cancer screening. Recommendations will vary depending on your family history and other risks. Hepatitis C blood test. Hepatitis B blood test. Sexually transmitted disease (STD) testing. Diabetes screening. This is done by checking your blood sugar (glucose) after you have not eaten for a while (fasting). You may have this done every 1-3 years. Abdominal aortic aneurysm (AAA) screening. You may need this if you are a current or former smoker. Osteoporosis. You may be screened starting at age 35 if you are at high risk. Talk with your health care provider about your test results, treatment options, and if necessary, the need for more tests. Vaccines  Your health care provider may recommend certain vaccines, such as: Influenza vaccine. This is recommended every year. Tetanus, diphtheria, and acellular pertussis (Tdap, Td) vaccine. You may need a Td booster every 10 years. Zoster vaccine. You may need this after age 60. Pneumococcal 13-valent conjugate (PCV13) vaccine. One dose is recommended after age 29. Pneumococcal polysaccharide (PPSV23)  vaccine. One dose is recommended after  age 7. Talk to your health care provider about which screenings and vaccines you need and how often you need them. This information is not intended to replace advice given to you by your health care provider. Make sure you discuss any questions you have with your health care provider. Document Released: 12/24/2015 Document Revised: 08/16/2016 Document Reviewed: 09/28/2015 Elsevier Interactive Patient Education  2017 [place] Prevention in the Home Falls can cause injuries. They can happen to people of all ages. There are many things you can do to make your home safe and to help prevent falls. What can I do on the outside of my home? Regularly fix the edges of walkways and driveways and fix any cracks. Remove anything that might make you trip as you walk through a door, such as a raised step or threshold. Trim any bushes or trees on the path to your home. Use bright outdoor lighting. Clear any walking paths of anything that might make someone trip, such as rocks or tools. Regularly check to see if handrails are loose or broken. Make sure that both sides of any steps have handrails. Any raised decks and porches should have guardrails on the edges. Have any leaves, snow, or ice cleared regularly. Use sand or salt on walking paths during winter. Clean up any spills in your garage right away. This includes oil or grease spills. What can I do in the bathroom? Use night lights. Install grab bars by the toilet and in the tub and shower. Do not use towel bars as grab bars. Use non-skid mats or decals in the tub or shower. If you need to sit down in the shower, use a plastic, non-slip stool. Keep the floor dry. Clean up any water that spills on the floor as soon as it happens. Remove soap buildup in the tub or shower regularly. Attach bath mats securely with double-sided non-slip rug tape. Do not have throw rugs and other things on the floor that can make you trip. What can I do in the  bedroom? Use night lights. Make sure that you have a light by your bed that is easy to reach. Do not use any sheets or blankets that are too big for your bed. They should not hang down onto the floor. Have a firm chair that has side arms. You can use this for support while you get dressed. Do not have throw rugs and other things on the floor that can make you trip. What can I do in the kitchen? Clean up any spills right away. Avoid walking on wet floors. Keep items that you use a lot in easy-to-reach places. If you need to reach something above you, use a strong step stool that has a grab bar. Keep electrical cords out of the way. Do not use floor polish or wax that makes floors slippery. If you must use wax, use non-skid floor wax. Do not have throw rugs and other things on the floor that can make you trip. What can I do with my stairs? Do not leave any items on the stairs. Make sure that there are handrails on both sides of the stairs and use them. Fix handrails that are broken or loose. Make sure that handrails are as long as the stairways. Check any carpeting to make sure that it is firmly attached to the stairs. Fix any carpet that is loose or worn. Avoid having throw rugs at the top or bottom  of the stairs. If you do have throw rugs, attach them to the floor with carpet tape. Make sure that you have a light switch at the top of the stairs and the bottom of the stairs. If you do not have them, ask someone to add them for you. What else can I do to help prevent falls? Wear shoes that: Do not have high heels. Have rubber bottoms. Are comfortable and fit you well. Are closed at the toe. Do not wear sandals. If you use a stepladder: Make sure that it is fully opened. Do not climb a closed stepladder. Make sure that both sides of the stepladder are locked into place. Ask someone to hold it for you, if possible. Clearly mark and make sure that you can see: Any grab bars or  handrails. First and last steps. Where the edge of each step is. Use tools that help you move around (mobility aids) if they are needed. These include: Canes. Walkers. Scooters. Crutches. Turn on the lights when you go into a dark area. Replace any light bulbs as soon as they burn out. Set up your furniture so you have a clear path. Avoid moving your furniture around. If any of your floors are uneven, fix them. If there are any pets around you, be aware of where they are. Review your medicines with your doctor. Some medicines can make you feel dizzy. This can increase your chance of falling. Ask your doctor what other things that you can do to help prevent falls. This information is not intended to replace advice given to you by your health care provider. Make sure you discuss any questions you have with your health care provider. Document Released: 09/23/2009 Document Revised: 05/04/2016 Document Reviewed: 01/01/2015 Elsevier Interactive Patient Education  2017 [employer].

## 2021-12-01 NOTE — Progress Notes (Signed)
Subjective:   Joseph Macdonald is a 75 y.o. male who presents for Medicare Annual/Subsequent preventive examination.  Review of Systems           Objective:    Today's Vitals   12/01/21 0934 12/01/21 0936  BP: (!) 159/80   Pulse: 69   Temp: 98.4 F (36.9 C)   TempSrc: Oral   SpO2: 93%   Weight: 236 lb 1.6 oz (107.1 kg)   Height: 5\' 7"  (1.702 m)   PainSc:  5    Body mass index is 36.98 kg/m.  Advanced Directives 07/24/2021 06/28/2021 12/01/2020 11/25/2020 11/29/2019 10/30/2019 06/10/2019  Does Patient Have a Medical Advance Directive? No No No Yes No Yes No  Type of Advance Directive - - Public librarian;Living will - [place];Living will -  Copy of [place] in Chart? - - - No - copy requested - No - copy requested -  Would patient like information on creating a medical advance directive? - Yes (MAU/Ambulatory/Procedural Areas - Information given) No - Patient declined - No - Patient declined No - Patient declined No - Patient declined    Current Medications (verified) Outpatient Encounter Medications as of 12/01/2021  Medication Sig   albuterol (PROVENTIL HFA;VENTOLIN HFA) 108 (90 Base) MCG/ACT inhaler Inhale into the lungs every 6 (six) hours as needed for wheezing or shortness of breath.   apixaban (ELIQUIS) 5 MG TABS tablet Take 5 mg by mouth 2 (two) times daily.   budesonide-formoterol (SYMBICORT) 160-4.5 MCG/ACT inhaler Inhale 2 puffs into the lungs 2 (two) times daily.   cholecalciferol (VITAMIN D) 25 MCG (1000 UNIT) tablet Take 2,000 Units by mouth daily.   empagliflozin (JARDIANCE) 25 MG TABS tablet Take by mouth daily.   famotidine (PEPCID) 20 MG tablet Take 20 mg by mouth as needed for heartburn or indigestion.   flecainide (TAMBOCOR) 50 MG tablet Take 50 mg by mouth 2 (two) times daily.   HYDROcodone-acetaminophen (NORCO/VICODIN) 5-325 MG tablet Take 1 tablet by mouth every 6 (six) hours as needed for  moderate pain.   omeprazole (PRILOSEC) 40 MG capsule Take 40 mg by mouth 2 (two) times daily.    pramipexole (MIRAPEX) 0.5 MG tablet Take 1 tablet (0.5 mg total) by mouth at bedtime.   pravastatin (PRAVACHOL) 20 MG tablet Take 20 mg by mouth daily.   predniSONE (DELTASONE) 20 MG tablet Take 15 mg by mouth daily with breakfast.   pyridostigmine (MESTINON) 60 MG tablet Take 30 mg by mouth 3 (three) times daily.   terbinafine (LAMISIL) 250 MG tablet Take 250 mg by mouth daily. (Patient not taking: Reported on 10/13/2021)   tiotropium (SPIRIVA) 18 MCG inhalation capsule Place 18 mcg into inhaler and inhale daily.   torsemide (DEMADEX) 20 MG tablet Take 1 tablet (20 mg) by mouth 3 days a week   vitamin B-12 (CYANOCOBALAMIN) 1000 MCG tablet Take 1,000 mcg by mouth daily.   No facility-administered encounter medications on file as of 12/01/2021.    Allergies (verified) Patient has no known allergies.   History: Past Medical History:  Diagnosis Date   Arthritis    COPD (chronic obstructive pulmonary disease) ([place])    Diabetes mellitus without complication (HCC)    diet controlled   Dyspnea    with activity   Hyperlipidemia    Hypertension    Myasthenia gravis ([place])    Oxygen deficit    Ventricular tachycardia    Past Surgical History:  Procedure Laterality  Date   carpel tunnel sx     CATARACT EXTRACTION     right eye   CATARACT EXTRACTION W/PHACO Left 12/01/2020   Procedure: CATARACT EXTRACTION PHACO AND INTRAOCULAR LENS PLACEMENT (IOC) LEFT 10.83 01:14.1 14.6%;  Surgeon: Leandrew Koyanagi, MD;  Location: [facility];  Service: Ophthalmology;  Laterality: Left;   RETINAL DETACHMENT SURGERY     SKIN GRAFT     Behind left knee   TONSILLECTOMY AND ADENOIDECTOMY     Family History  Problem Relation Age of Onset   Hypertension Mother    Thyroid disease Mother    Kidney disease Mother    Arthritis Mother    Stroke Father    Heart disease Father 42   Arthritis Father     Heart attack Father    Arthritis Other    Social History   Socioeconomic History   Marital status: Married    Spouse name: Not on file   Number of children: 1   Years of education: H/S   Highest education level: High school graduate  Occupational History   Occupation: Retired Building surveyor  Tobacco Use   Smoking status: Former    Packs/day: 0.50    Years: 40.00    Pack years: 20.00    Types: Pipe, Cigarettes    Quit date: 08/19/2017    Years since quitting: 4.2   Smokeless tobacco: Former    Quit date: 08/19/2017   Tobacco comments:    previously smoked 1 1/2 pack since his 75 years old  Quit 2018  Vaping Use   Vaping Use: Former   Substances: Nicotine, Flavoring  Substance and Sexual Activity   Alcohol use: Yes    Comment: occasionally- monthly or less   Drug use: No   Sexual activity: Not on file  Other Topics Concern   Not on file  Social History Narrative   2 adopted children   Social Determinants of Health   Financial Resource Strain: Not on file  Food Insecurity: Not on file  Transportation Needs: Not on file  Physical Activity: Not on file  Stress: Not on file  Social Connections: Not on file    Tobacco Counseling Counseling given: Not Answered Tobacco comments: previously smoked 1 1/2 pack since his 75 years old  Quit 2018   Clinical Intake:  Pre-visit preparation completed: Yes  Pain : 0-10 Pain Score: 5  Pain Type: Chronic pain Pain Location: Back Pain Orientation: Lower Pain Descriptors / Indicators: Aching, Nagging Pain Onset: More than a month ago Pain Frequency: Intermittent     Nutritional Risks: None Diabetes: No  How often do you need to have someone help you when you read instructions, pamphlets, or other written materials from your doctor or pharmacy?: 1 - Never  Diabetic?no  Interpreter Needed?: No  Information entered by :: Kirke Shaggy, LPN   Activities of Daily Living In your present state of health,  do you have any difficulty performing the following activities: 12/01/2021 12/01/2020  Hearing? Y N  Vision? Y N  Difficulty concentrating or making decisions? Y N  Walking or climbing stairs? Y Y  Dressing or bathing? N N  Doing errands, shopping? Y -  Conservation officer, nature and eating ? N -  Using the Toilet? N -  In the past six months, have you accidently leaked urine? Y -  Do you have problems with loss of bowel control? Y -  Managing your Medications? N -  Managing your Finances? N -  Housekeeping or managing your Housekeeping? N -  Some recent data might be hidden    Patient Care Team: [name], MD as PCP - General (Family Medicine) Rockey Situ, Kathlene November, MD as PCP - Cardiology (Cardiology) Desiree Hane, [place] as Consulting Physician (Physician Assistant) Dorcas Carrow, MD as Referring Physician (Hematology and Oncology) Angelique Blonder, MD as Referring Physician (Neurology) Deboraha Sprang, MD as Consulting Physician (Cardiology) End, Harrell Gave, MD as Consulting Physician (Cardiology) Leandrew Koyanagi, MD as Referring Physician (Ophthalmology) Ree Edman, MD (Dermatology)  Indicate any recent Medical Services you may have received from other than [facility] providers in the past year (date may be approximate).     Assessment:   This is a routine wellness examination for Joseph Macdonald.  Hearing/Vision screen No results found.  Dietary issues and exercise activities discussed:     Goals Addressed   None    Depression Screen PHQ 2/9 Scores 07/04/2021 11/25/2020 10/28/2018 10/25/2017 08/28/2017 05/15/2016  PHQ - 2 Score 0 0 0 0 0 0  PHQ- 9 Score 1 - - 2 0 -    Fall Risk Fall Risk  12/01/2021 06/28/2021 11/25/2020 10/30/2019 10/28/2018  Falls in the past year? 0 0 0 1 0  Number falls in past yr: 0 - 0 1 -  Injury with Fall? 0 - 0 0 -  Risk for fall due to : - No Fall Risks - Other (Comment) -  Risk for fall due to: Comment - - - respiratory issues -  Follow up -  Education provided;Falls prevention discussed - Falls prevention discussed -    FALL RISK PREVENTION PERTAINING TO THE HOME:  Any stairs in or around the home? Yes  If so, are there any without handrails? Yes  Home free of loose throw rugs in walkways, pet beds, electrical cords, etc? Yes  Adequate lighting in your home to reduce risk of falls? Yes   ASSISTIVE DEVICES UTILIZED TO PREVENT FALLS:  Life alert? No  Use of a cane, walker or w/c? Yes  Grab bars in the bathroom? Yes  Shower chair or bench in shower? Yes  Elevated toilet seat or a handicapped toilet? Yes   TIMED UP AND GO:  Was the test performed? Yes .  Length of time to ambulate 10 feet: 6 sec.   Gait slow and steady with assistive device  Cognitive Function:        Immunizations Immunization History  Administered Date(s) Administered   Fluad Quad(high Dose 65+) 09/08/2020, 09/14/2021   Influenza, High Dose Seasonal PF 08/21/2015, 09/12/2016, 08/28/2017, 09/11/2018   PFIZER Comirnaty(Gray Top)Covid-19 Tri-Sucrose Vaccine 05/24/2021   PFIZER(Purple Top)SARS-COV-2 Vaccination 01/22/2020, 02/16/2020, 10/21/2020   Pneumococcal Conjugate-13 03/11/2014    TDAP status: Due, Education has been provided regarding the importance of this vaccine. Advised may receive this vaccine at local pharmacy or Health Dept. Aware to provide a copy of the vaccination record if obtained from local pharmacy or Health Dept. Verbalized acceptance and understanding.  Flu Vaccine status: Up to date  Pneumococcal vaccine status: Up to date  Covid-19 vaccine status: Completed vaccines  Qualifies for Shingles Vaccine? Yes   Zostavax completed No   Shingrix Completed?: Yes  Screening Tests Health Maintenance  Topic Date Due   URINE MICROALBUMIN  Never done   Hepatitis C Screening  Never done   TETANUS/TDAP  Never done   Zoster Vaccines- Shingrix (1 of 2) Never done   Pneumonia Vaccine 22+ Years old (2 - PPSV23 if available, else  PCV20)  03/12/2015   COLONOSCOPY (Pts 45-10yrs Insurance coverage will need to be confirmed)  05/08/2017   COVID-19 Vaccine (5 - Booster for Pfizer series) 07/19/2021   INFLUENZA VACCINE  Completed   HPV VACCINES  Aged Out    Health Maintenance  Health Maintenance Due  Topic Date Due   URINE MICROALBUMIN  Never done   Hepatitis C Screening  Never done   TETANUS/TDAP  Never done   Zoster Vaccines- Shingrix (1 of 2) Never done   Pneumonia Vaccine 70+ Years old (2 - PPSV23 if available, else PCV20) 03/12/2015   COLONOSCOPY (Pts 45-81yrs Insurance coverage will need to be confirmed)  05/08/2017   COVID-19 Vaccine (5 - Booster for [place] series) 07/19/2021    Colorectal cancer screening: Type of screening: Colonoscopy. Completed 05/08/12. Repeat every 10 years- has appt 12/28 this year  Lung Cancer Screening: (Low Dose CT Chest recommended if Age 35-80 years, 30 pack-year currently smoking OR have quit w/in 15years.) does qualify.   Lung Cancer Screening Referral: declined, had done at [facility]  Additional Screening:  Hepatitis C Screening: does not qualify; Completed no  Vision Screening: Recommended annual ophthalmology exams for early detection of glaucoma and other disorders of the eye. Is the patient up to date with their annual eye exam?  Yes  Who is the provider or what is the name of the office in which the patient attends annual eye exams? [facility] If pt is not established with a provider, would they like to be referred to a provider to establish care? No .   Dental Screening: Recommended annual dental exams for proper oral hygiene  Community Resource Referral / Chronic Care Management: CRR required this visit?  No   CCM required this visit?  No      Plan:     I have personally reviewed and noted the following in the patients chart:   Medical and social history Use of alcohol, tobacco or illicit drugs  Current medications and supplements including opioid  prescriptions. Patient is currently taking opioid prescriptions. Information provided to patient regarding non-opioid alternatives. Patient advised to discuss non-opioid treatment plan with their provider. Functional ability and status Nutritional status Physical activity Advanced directives List of other physicians Hospitalizations, surgeries, and ER visits in previous 12 months Vitals Screenings to include cognitive, depression, and falls Referrals and appointments  In addition, I have reviewed and discussed with patient certain preventive protocols, quality metrics, and best practice recommendations. A written personalized care plan for preventive services as well as general preventive health recommendations were provided to patient.     Dionisio David, LPN   [id number]   Nurse Notes: none

## 2021-12-13 DIAGNOSIS — L570 Actinic keratosis: Secondary | ICD-10-CM | POA: Diagnosis not present

## 2021-12-13 DIAGNOSIS — L578 Other skin changes due to chronic exposure to nonionizing radiation: Secondary | ICD-10-CM | POA: Diagnosis not present

## 2021-12-13 DIAGNOSIS — L821 Other seborrheic keratosis: Secondary | ICD-10-CM | POA: Diagnosis not present

## 2021-12-13 DIAGNOSIS — Z859 Personal history of malignant neoplasm, unspecified: Secondary | ICD-10-CM | POA: Diagnosis not present

## 2021-12-13 DIAGNOSIS — I872 Venous insufficiency (chronic) (peripheral): Secondary | ICD-10-CM | POA: Diagnosis not present

## 2021-12-13 DIAGNOSIS — B354 Tinea corporis: Secondary | ICD-10-CM | POA: Diagnosis not present

## 2021-12-30 ENCOUNTER — Encounter: Payer: Self-pay | Admitting: Family Medicine

## 2021-12-30 DIAGNOSIS — M9979 Connective tissue and disc stenosis of intervertebral foramina of abdomen and other regions: Secondary | ICD-10-CM

## 2021-12-30 MED ORDER — HYDROCODONE-ACETAMINOPHEN 5-325 MG PO TABS: 1.0000 | ORAL_TABLET | Freq: Four times a day (QID) | ORAL | 0 refills | Status: DC | PRN

## 2021-12-30 NOTE — Telephone Encounter (Signed)
LOV:  12/25/2019  NOV: None  Last Refill:  11/29/2021 #120 0 Refills   Thanks,   -Mickel Baas

## 2022-01-03 DIAGNOSIS — M1711 Unilateral primary osteoarthritis, right knee: Secondary | ICD-10-CM | POA: Insufficient documentation

## 2022-01-17 DIAGNOSIS — H40053 Ocular hypertension, bilateral: Secondary | ICD-10-CM | POA: Diagnosis not present

## 2022-01-30 ENCOUNTER — Other Ambulatory Visit: Payer: Self-pay | Admitting: Family Medicine

## 2022-01-30 DIAGNOSIS — M9979 Connective tissue and disc stenosis of intervertebral foramina of abdomen and other regions: Secondary | ICD-10-CM

## 2022-01-30 MED ORDER — HYDROCODONE-ACETAMINOPHEN 5-325 MG PO TABS: 1.0000 | ORAL_TABLET | Freq: Four times a day (QID) | ORAL | 0 refills | Status: DC | PRN

## 2022-02-24 ENCOUNTER — Telehealth: Payer: Self-pay | Admitting: Internal Medicine

## 2022-02-24 DIAGNOSIS — G7000 Myasthenia gravis without (acute) exacerbation: Secondary | ICD-10-CM | POA: Diagnosis not present

## 2022-02-24 DIAGNOSIS — D692 Other nonthrombocytopenic purpura: Secondary | ICD-10-CM | POA: Diagnosis not present

## 2022-02-24 DIAGNOSIS — I472 Ventricular tachycardia, unspecified: Secondary | ICD-10-CM | POA: Diagnosis not present

## 2022-02-24 DIAGNOSIS — J449 Chronic obstructive pulmonary disease, unspecified: Secondary | ICD-10-CM | POA: Diagnosis not present

## 2022-02-24 DIAGNOSIS — E119 Type 2 diabetes mellitus without complications: Secondary | ICD-10-CM | POA: Diagnosis not present

## 2022-02-24 DIAGNOSIS — Z6837 Body mass index (BMI) 37.0-37.9, adult: Secondary | ICD-10-CM | POA: Diagnosis not present

## 2022-02-24 DIAGNOSIS — I5030 Unspecified diastolic (congestive) heart failure: Secondary | ICD-10-CM | POA: Diagnosis not present

## 2022-02-24 DIAGNOSIS — I110 Hypertensive heart disease with heart failure: Secondary | ICD-10-CM | POA: Diagnosis not present

## 2022-02-24 DIAGNOSIS — F330 Major depressive disorder, recurrent, mild: Secondary | ICD-10-CM | POA: Diagnosis not present

## 2022-02-24 NOTE — Telephone Encounter (Signed)
I spoke with Brandi with [facility], contracted with his insurance co.... she saw the pt today and his HR has been up and dow... today he was fluctuating between 38 at rest up to 52/ min.  ? ?He is asymptomatic... he has been monitoring himself via apple watch and says he has had it down in the 30's usually at night but he is typically running in the 50's... his BP was 152/68 and he is asymptomatic... no dizziness, no dizziness, no SOB and his rhythm feels regular according to her.  ? ?She will have him continue to monitor. I will forward to Dr. Caryl Comes and his nurse for review and further recommendations.  ?

## 2022-02-24 NOTE — Telephone Encounter (Signed)
STAT if HR is under 50 or over 120 ?(normal HR is 60-100 beats per minute) ? ?What is your heart rate? 32-52 ? ?Do you have a log of your heart rate readings (document readings)?   ? ?Do you have any other symptoms? No  ? ?

## 2022-02-28 ENCOUNTER — Telehealth: Payer: Self-pay | Admitting: Internal Medicine

## 2022-02-28 ENCOUNTER — Encounter: Payer: Self-pay | Admitting: Family Medicine

## 2022-02-28 DIAGNOSIS — M9979 Connective tissue and disc stenosis of intervertebral foramina of abdomen and other regions: Secondary | ICD-10-CM

## 2022-02-28 MED ORDER — HYDROCODONE-ACETAMINOPHEN 5-325 MG PO TABS: 1.0000 | ORAL_TABLET | Freq: Four times a day (QID) | ORAL | 0 refills | Status: DC | PRN

## 2022-02-28 NOTE — Telephone Encounter (Signed)
STAT if HR is under 50 or over 120 (normal HR is 60-100 beats per minute)  What is your heart rate? Last week on Friday is was in the 30s This morning it was 69  Do you have a log of your heart rate readings (document readings)?   Do you have any other symptoms? Sometimes complains of tightness in his chest. Has been a couple of days since he has experienced that. He has COPD  Pt c/o of Chest Pain: STAT if CP now or developed within 24 hours  1. Are you having CP right now? no  2. Are you experiencing any other symptoms (ex. SOB, nausea, vomiting, sweating)? No, just concerned about low HR last week  3. How long have you been experiencing CP? Has been going on for a while  4. Is your CP continuous or coming and going? Comes and goes  5. Have you taken Nitroglycerin? no ?

## 2022-02-28 NOTE — Telephone Encounter (Signed)
Patient made an appt with Dr. Rockey Situ on 4/24

## 2022-02-28 NOTE — Telephone Encounter (Signed)
I called and spoke with Mrs. Morales (ok per DPR). ? ?A call was received on 02/24/22 after a nurse that comes to the home with his insurance, called and reported the patient having HR's in the 30's. ? ?The patient was asymptomatic with these readings at the time. ? ?Mrs. Gasior called the office today just to follow up on low HR's from Friday. ?She advised the patient has not had any readings this low since Friday. ?At the time his HR's were documented in the 30's on his smart watch, he had just woken up from a nap. ?Mrs. Vogt advised the patient will see readings this low occasionally, but his readings don't stay that low for long.  ? ?I have confirmed with the patient's wife that he is not experiencing any increase in SOB/ no dizziness/ lightheadedness.  ? ?I advised Mrs. Sennett that with the patient's history of PVC's, he may very well be having these when his HR's are reading in the 30's. ?She is advised the devices such as automatic BP cuffs/ pulse oximeters will not count a PVC as a heart beat and therefore the readings will appear much lower than what they actually are. ? ?Mrs. Deisher is aware the message from last week was sent to Dr. Caryl Comes to review and that I will update him of our conversation today. ?She is aware I will call back with any further MD recommendations once received.  ? ?She is advised of ER precautions if the patient does notice HR's in the 30s and becomes dizzy/ lightheaded/ pre-syncopal. ? ?She voices understanding of all of the above and is agreeable. ? ? ? ?

## 2022-02-28 NOTE — Telephone Encounter (Signed)
Patient's wife called back today- see additional phone note dated 02/28/22. ? ? ?

## 2022-03-02 NOTE — Telephone Encounter (Signed)
Deboraha Sprang, MD [phone])  ?Sent: Wed March 01, 2022  5:19 PM  ?To: Emily Filbert, RN  ? ? ?  ?   ? ?Message ? 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 ?

## 2022-03-02 NOTE — Telephone Encounter (Signed)
I called and spoke with the patient's wife. ?I have advised her of Dr. Olin Pia feedback as documented below.  ? ?I have asked that they call back should the patient start to have symptoms with any documented low heart rates. ? ?Joseph Macdonald's voices understanding and is agreeable. ? ?She was very appreciative of the call back.  ? ?

## 2022-03-29 NOTE — Progress Notes (Signed)
Cardiology Office Note ? ?Date:  04/03/2022  ? ?ID:  Joseph Macdonald, DOB 1946/02/18, MRN [id number] ? ?PCP:  [name], MD  ? ?Chief Complaint  ?Patient presents with  ? Shortness of Breath  ?  Patient c/o shortness of breath with very little walking, LE edema and tachycardia. Medications reviewed by the patient verbally.   ? ? ?HPI:  ?Joseph Macdonald is a 76 y.o. male with history of  ?sustained VT,  ?myasthenia gravis with myasthenia crisis requiring plasma exchange at [facility] in 12/2018,  ?HFpEF,  ?HTN,  ?smoking, COPD, on oxygen ?HLD  ?HCM ?who presents for follow up of HFpEF, atrial fibrillation on Eliquis ? ?Last seen by myself in clinic July 2020 ?Dramatic decline in his health since that time ? ?Last seen by Dr. Caryl Comes November 2022, restarted on torsemide at that time 20 mg 3 days a week ? ?Reports having continued leg swelling, tries to keep his legs up during the daytime ?Does TED hose, ace wraps, but they cannot his legs below the knees ?Leg swelling on left dates back to trauma when he was in the [employer], helicopter injury ? ?Legs "ache" at night, better with hot tub ?Gabapentin did not work, mirapex works a little bit ?Pain medications work best, Dr. Caryn Section is helping ? ?Last labs August 2022 ? ?EKG personally reviewed by myself on todays visit ?Normal sinus rhythm rate 63 bpm poor R wave progression through the anterior precordial leads, left axis deviation ? ?Last medical history reviewed ?[facility]  12/2018 for marked weakness consistent with myasthenia crisis. He underwent plasma exchange and was noted to have episodes of NSVT.  ? ? Holter monitoring,asymptomatic WCT.  ?readmitted to the hospital and started on amiodarone.  ?Cardiac workup including echo, LHC, and cardiac MRI showed no evidence of cardiomyopathy or significant CAD.  ? ?increase in exertional dyspnea and lower extremity swelling over the past several months  ?15 pound weight gain over the prior 3-4 months.  ? started on  Lasix 40 mg daily x 1 week (with reduction in HCTZ).  ? ?referred to EP.  ?stable weight around 220-222 pounds (down 11 pounds over the prior month).  ? [place]  increased [place] extremity swelling. ?Lasix to 40 mg bid  ?abdominal distension with associated early satiety and now 2-pillow orthopnea (baseline 1-pillow).  ? ?dry weight around 223-225 pounds dating back to 11/2018, ? ? though since his admission to [facility] in 12/2018 his weight has hovered around 232 pounds. ? ?Seen by Dr. Caryl Comes June 2020 amiodarone dosing decreased,  TSH ? ? ?PMH:   has a past medical history of Arthritis, COPD (chronic obstructive pulmonary disease) ([place]), Diabetes mellitus without complication ([place]), Dyspnea, Hyperlipidemia, Hypertension, Myasthenia gravis ([place]), Oxygen deficit, and Ventricular tachycardia ([place]). ? ?PSH:    ?Past Surgical History:  ?Procedure Laterality Date  ? carpel tunnel sx    ? CATARACT EXTRACTION    ? right eye  ? CATARACT EXTRACTION W/PHACO Left 12/01/2020  ? Procedure: CATARACT EXTRACTION PHACO AND INTRAOCULAR LENS PLACEMENT (IOC) LEFT 10.83 01:14.1 14.6%;  Surgeon: Leandrew Koyanagi, MD;  Location: [facility];  Service: Ophthalmology;  Laterality: Left;  ? RETINAL DETACHMENT SURGERY    ? SKIN GRAFT    ? Behind left knee  ? TONSILLECTOMY AND ADENOIDECTOMY    ? ? ?Current Outpatient Medications  ?Medication Sig Dispense Refill  ? albuterol (PROVENTIL HFA;VENTOLIN HFA) 108 (90 Base) MCG/ACT inhaler Inhale into the lungs every 6 (six)  hours as needed for wheezing or shortness of breath.    ? apixaban (ELIQUIS) 5 MG TABS tablet Take 5 mg by mouth 2 (two) times daily.    ? budesonide-formoterol (SYMBICORT) 160-4.5 MCG/ACT inhaler Inhale 2 puffs into the lungs 2 (two) times daily. 1 Inhaler 0  ? cholecalciferol (VITAMIN D) 25 MCG (1000 UNIT) tablet Take 2,000 Units by mouth daily.    ? empagliflozin (JARDIANCE) 25 MG TABS tablet Take by mouth daily.    ? famotidine (PEPCID) 20 MG tablet Take 20 mg by  mouth as needed for heartburn or indigestion.    ? flecainide (TAMBOCOR) 50 MG tablet Take 50 mg by mouth 2 (two) times daily.    ? HYDROcodone-acetaminophen (NORCO/VICODIN) 5-325 MG tablet Take 1 tablet by mouth every 6 (six) hours as needed for moderate pain. 120 tablet 0  ? omeprazole (PRILOSEC) 40 MG capsule Take 40 mg by mouth 2 (two) times daily.     ? pramipexole (MIRAPEX) 0.5 MG tablet Take 1 tablet (0.5 mg total) by mouth at bedtime. 30 tablet 12  ? pravastatin (PRAVACHOL) 20 MG tablet Take 20 mg by mouth daily.    ? predniSONE (DELTASONE) 20 MG tablet Take 15 mg by mouth daily with breakfast.    ? pyridostigmine (MESTINON) 60 MG tablet Take 30 mg by mouth 3 (three) times daily.    ? terbinafine (LAMISIL) 250 MG tablet Take 250 mg by mouth daily.    ? tiotropium (SPIRIVA) 18 MCG inhalation capsule Place 18 mcg into inhaler and inhale daily.    ? torsemide (DEMADEX) 20 MG tablet Take 1 tablet (20 mg) by mouth 3 days a week    ? vitamin B-12 (CYANOCOBALAMIN) 1000 MCG tablet Take 1,000 mcg by mouth daily.    ? ?No current facility-administered medications for this visit.  ? ? ? ?Allergies:   Patient has no known allergies.  ? ?Social History:  The patient  reports that he quit smoking about 4 years ago. His smoking use included pipe and cigarettes. He has a 20.00 pack-year smoking history. He quit smokeless tobacco use about 4 years ago. He reports current alcohol use. He reports that he does not use drugs.  ? ?Family History:   family history includes Arthritis in his father, mother, and another family member; Heart attack in his father; Heart disease (age of onset: 61) in his father; Hypertension in his mother; Kidney disease in his mother; Stroke in his father; Thyroid disease in his mother.  ? ? ?Review of Systems: ?Review of Systems  ?Constitutional: Negative.   ?HENT: Negative.    ?Respiratory:  Positive for shortness of breath.   ?Cardiovascular:   ?     Left leg swelling pitting  ?Gastrointestinal:  Negative.   ?Musculoskeletal: Negative.   ?Neurological: Negative.   ?Psychiatric/Behavioral: Negative.    ?All other systems reviewed and are negative. ? ? ?PHYSICAL EXAM: ?VS:  BP 140/90 (BP Location: Left Arm, Patient Position: Sitting, Cuff Size: Normal)   Pulse 63   Ht '5\' 7"'$  (1.702 m)   Wt 231 lb 6 oz (105 kg)   SpO2 95%   BMI 36.24 kg/m?  , BMI Body mass index is 36.24 kg/m?Marland Kitchen ?Constitutional:  oriented to person, place, and time. No distress.  ?HENT:  ?Head: Grossly normal ?Eyes:  no discharge. No scleral icterus.  ?Neck: No JVD, no carotid bruits  ?Cardiovascular: Regular rate and rhythm, no murmurs appreciated ?1+ pitting lower extremity edema to the mid shins ?Pulmonary/Chest: Clear to  auscultation bilaterally, no wheezes or rails ?Abdominal: Soft.  no distension.  no tenderness.  ?Musculoskeletal: Normal range of motion ?Neurological:  normal muscle tone. Coordination normal. No atrophy ?Skin: Skin warm and dry ?Psychiatric: normal affect, pleasant ? ? ?Recent Labs: ?07/24/2021: B Natriuretic Peptide 484.7; BUN 24; Creatinine, Ser 0.73; Hemoglobin 15.4; Platelets 166; Potassium 4.9; Sodium 142  ? ? ?Lipid Panel ?Lab Results  ?Component Value Date  ? CHOL 171 12/21/2015  ? HDL 89 (A) 12/21/2015  ? ?  ? ?Wt Readings from Last 3 Encounters:  ?04/03/22 231 lb 6 oz (105 kg)  ?12/01/21 236 lb 1.6 oz (107.1 kg)  ?10/13/21 227 lb (103 kg)  ?  ? ? ?ASSESSMENT AND PLAN: ? ?Problem List Items Addressed This Visit   ? ?  ? Cardiology Problems  ? Chronic heart failure with preserved ejection fraction (HFpEF) ([place]) - Primary  ? Ventricular tachycardia ([place])  ? Essential hypertension  ?  ? Other  ? COPD (chronic obstructive pulmonary disease) ([place])  ? ?Other Visit Diagnoses   ? ? Morbid obesity ([place])      ? ?  ?Lower extremity edema left leg ?Likely venous insufficiency, unable to exclude lymphedema ?No echocardiogram on record, we have ordered an echocardiogram for persistent ?He will continue to take torsemide  sparingly ?Lab work ordered today ? ?Shortness of breath/diastolic CHF ?Exacerbated by morbid obesity ?Echocardiogram ordered to rule out other cardiac etiology ? ?Morbid obesity ?We have encouraged continued exer

## 2022-04-03 ENCOUNTER — Ambulatory Visit: Payer: PPO | Admitting: Cardiovascular Disease

## 2022-04-03 ENCOUNTER — Encounter: Payer: Self-pay | Admitting: Cardiovascular Disease

## 2022-04-03 VITALS — BP 140/90 | HR 63 | Ht 67.0 in | Wt 231.4 lb

## 2022-04-03 DIAGNOSIS — I10 Essential (primary) hypertension: Secondary | ICD-10-CM | POA: Diagnosis not present

## 2022-04-03 DIAGNOSIS — E785 Hyperlipidemia, unspecified: Secondary | ICD-10-CM | POA: Diagnosis not present

## 2022-04-03 DIAGNOSIS — I472 Ventricular tachycardia, unspecified: Secondary | ICD-10-CM

## 2022-04-03 DIAGNOSIS — I5032 Chronic diastolic (congestive) heart failure: Secondary | ICD-10-CM | POA: Diagnosis not present

## 2022-04-03 DIAGNOSIS — J449 Chronic obstructive pulmonary disease, unspecified: Secondary | ICD-10-CM

## 2022-04-03 NOTE — Patient Instructions (Addendum)
Medication Instructions:  ?No changes ? ?If you need a refill on your cardiac medications before your next appointment, please call your pharmacy.  ? ?Lab work: ?Today: CMP , BNP, lipids ? ?Testing/Procedures: ? ?Your physician has requested that you have an echocardiogram. Echocardiography is a painless test that uses sound waves to create images of your heart. It provides your doctor with information about the size and shape of your heart and how well your heart?s chambers and valves are working. This procedure takes approximately one hour. There are no restrictions for this procedure.  ? ?Follow-Up: ?At [facility], you and your health needs are our priority.  As part of our continuing mission to provide you with exceptional heart care, we have created designated Provider Care Teams.  These Care Teams include your primary Cardiologist (physician) and Advanced Practice Providers (APPs -  Physician Assistants and Nurse Practitioners) who all work together to provide you with the care you need, when you need it. ? ?You will need a follow up appointment in 6 months ? ?Providers on your designated Care Team:   ?Murray Hodgkins, NP ?Christell Faith, PA-C ?Cadence Kathlen Mody, PA-C ? ?COVID-19 Vaccine Information can be found at: [email or web address].uk For questions related to vaccine distribution or appointments, please email [email or web address] or call [phone].  ? ?

## 2022-04-04 ENCOUNTER — Other Ambulatory Visit: Payer: Self-pay | Admitting: Family Medicine

## 2022-04-04 DIAGNOSIS — M9979 Connective tissue and disc stenosis of intervertebral foramina of abdomen and other regions: Secondary | ICD-10-CM

## 2022-04-04 LAB — BRAIN NATRIURETIC PEPTIDE: BNP: 133.4 pg/mL — ABNORMAL HIGH (ref 0.0–100.0)

## 2022-04-04 MED ORDER — HYDROCODONE-ACETAMINOPHEN 5-325 MG PO TABS: 1.0000 | ORAL_TABLET | Freq: Four times a day (QID) | ORAL | 0 refills | Status: DC | PRN

## 2022-04-05 ENCOUNTER — Telehealth: Payer: Self-pay | Admitting: Emergency Medicine

## 2022-04-05 DIAGNOSIS — I5032 Chronic diastolic (congestive) heart failure: Secondary | ICD-10-CM

## 2022-04-05 LAB — COMPREHENSIVE METABOLIC PANEL
ALT: 18 IU/L (ref 0–44)
AST: 18 IU/L (ref 0–40)
Albumin/Globulin Ratio: 1.9 (ref 1.2–2.2)
Albumin: 4.1 g/dL (ref 3.7–4.7)
Alkaline Phosphatase: 50 IU/L (ref 44–121)
BUN/Creatinine Ratio: 19 (ref 10–24)
BUN: 17 mg/dL (ref 8–27)
Bilirubin Total: 0.3 mg/dL (ref 0.0–1.2)
CO2: 29 mmol/L (ref 20–29)
Calcium: 9.1 mg/dL (ref 8.6–10.2)
Chloride: 106 mmol/L (ref 96–106)
Creatinine, Ser: 0.91 mg/dL (ref 0.76–1.27)
Globulin, Total: 2.2 g/dL (ref 1.5–4.5)
Glucose: 106 mg/dL — ABNORMAL HIGH (ref 70–99)
Potassium: 5.5 mmol/L — ABNORMAL HIGH (ref 3.5–5.2)
Sodium: 149 mmol/L — ABNORMAL HIGH (ref 134–144)
Total Protein: 6.3 g/dL (ref 6.0–8.5)
eGFR: 88 mL/min/{1.73_m2} (ref >59)

## 2022-04-05 LAB — LIPID PANEL
Chol/HDL Ratio: 2.3 ratio (ref 0.0–5.0)
Cholesterol, Total: 141 mg/dL (ref 100–199)
HDL: 62 mg/dL (ref >39)
LDL Chol Calc (NIH): 62 mg/dL (ref 0–99)
Triglycerides: 92 mg/dL (ref 0–149)
VLDL Cholesterol Cal: 17 mg/dL (ref 5–40)

## 2022-04-05 NOTE — Telephone Encounter (Signed)
Called and spoke with patient.  ? ?Reviewed results and recommendations. Pt verbalized understanding. Pt stated that one of the reasons that his potassium may be elevated is that he has been using NoSalt. Advised patient to stop using that. Pt also expresses that he hasn't been drinking a lot of fluids, which may explain the increased sodium.  ? ?Pt will stop using the NoSalt and go for repeat BMP on Friday, 4/28.  ?

## 2022-04-05 NOTE — Telephone Encounter (Signed)
-----   Message from Minna Merritts, MD sent at 04/05/2022 11:15 AM EDT ----- ?Labs reviewed; BMP ?Sodium and potassium running high ?Would decreased sodium intake, also citrus or any potassium intake ?Need repeat BMP preferably this week ?Need to rule out hemolysis ?Stable renal function, good cholesterol ?

## 2022-04-07 ENCOUNTER — Other Ambulatory Visit
Admission: RE | Admit: 2022-04-07 | Discharge: 2022-04-07 | Disposition: A | Discharge status: Home / Self Care | Payer: PPO | Attending: Cardiovascular Disease | Admitting: Cardiovascular Disease

## 2022-04-07 DIAGNOSIS — I5032 Chronic diastolic (congestive) heart failure: Secondary | ICD-10-CM | POA: Insufficient documentation

## 2022-04-07 LAB — BASIC METABOLIC PANEL
Anion gap: 7 (ref 5–15)
BUN: 25 mg/dL — ABNORMAL HIGH (ref 8–23)
CO2: 33 mmol/L — ABNORMAL HIGH (ref 22–32)
Calcium: 8.8 mg/dL — ABNORMAL LOW (ref 8.9–10.3)
Chloride: 103 mmol/L (ref 98–111)
Creatinine, Ser: 1.02 mg/dL (ref 0.61–1.24)
GFR, Estimated: >60 mL/min (ref >60)
Glucose, Bld: 110 mg/dL — ABNORMAL HIGH (ref 70–99)
Potassium: 4.6 mmol/L (ref 3.5–5.1)
Sodium: 143 mmol/L (ref 135–145)

## 2022-04-20 ENCOUNTER — Ambulatory Visit (INDEPENDENT_AMBULATORY_CARE_PROVIDER_SITE_OTHER): Payer: PPO

## 2022-04-20 DIAGNOSIS — I5032 Chronic diastolic (congestive) heart failure: Secondary | ICD-10-CM | POA: Diagnosis not present

## 2022-04-20 LAB — ECHOCARDIOGRAM COMPLETE
AR max vel: 2.6 cm2
AV Area VTI: 2.49 cm2
AV Area mean vel: 2.41 cm2
AV Mean grad: 3 mmHg
AV Peak grad: 5.7 mmHg
Ao pk vel: 1.19 m/s
Area-P 1/2: 3.72 cm2
P 1/2 time: 549 msec
S' Lateral: 3.5 cm

## 2022-04-20 MED ORDER — PERFLUTREN LIPID MICROSPHERE
1.0000 mL | INTRAVENOUS | Status: AC | PRN
  Administered 2022-04-20: 2 mL via INTRAVENOUS

## 2022-04-24 ENCOUNTER — Telehealth: Payer: Self-pay | Admitting: Emergency Medicine

## 2022-04-24 NOTE — Telephone Encounter (Signed)
-----   Message from Minna Merritts, MD sent at 04/23/2022 12:09 PM EDT ----- ?Echocardiogram ?Challenging images, PVCs noted ?Ejection fraction mildly depressed 45 to 50% ?Mildly reduced RV function ?No significant valvular heart disease ?

## 2022-04-24 NOTE — Telephone Encounter (Signed)
Called and spoke with patient. Results reviewed with patient, pt verbalized understanding,  questions (if any) answered.   ?

## 2022-04-30 ENCOUNTER — Inpatient Hospital Stay
Admission: EM | Admit: 2022-04-30 | Discharge: 2022-05-18 | DRG: 871 | Disposition: A | Discharge status: Home / Self Care | Payer: PPO | Attending: Internal Medicine | Admitting: Internal Medicine

## 2022-04-30 ENCOUNTER — Inpatient Hospital Stay (HOSPITAL_COMMUNITY)
Admit: 2022-04-30 | Discharge: 2022-04-30 | Disposition: A | Discharge status: Home / Self Care | Payer: PPO | Attending: Pulmonary Disease | Admitting: Pulmonary Disease

## 2022-04-30 ENCOUNTER — Inpatient Hospital Stay: Payer: PPO

## 2022-04-30 ENCOUNTER — Emergency Department: Payer: PPO

## 2022-04-30 DIAGNOSIS — I272 Pulmonary hypertension, unspecified: Secondary | ICD-10-CM | POA: Diagnosis not present

## 2022-04-30 DIAGNOSIS — Z7189 Other specified counseling: Secondary | ICD-10-CM | POA: Diagnosis not present

## 2022-04-30 DIAGNOSIS — G7000 Myasthenia gravis without (acute) exacerbation: Secondary | ICD-10-CM | POA: Diagnosis present

## 2022-04-30 DIAGNOSIS — J449 Chronic obstructive pulmonary disease, unspecified: Secondary | ICD-10-CM | POA: Diagnosis present

## 2022-04-30 DIAGNOSIS — Z66 Do not resuscitate: Secondary | ICD-10-CM | POA: Diagnosis not present

## 2022-04-30 DIAGNOSIS — K7200 Acute and subacute hepatic failure without coma: Secondary | ICD-10-CM | POA: Diagnosis present

## 2022-04-30 DIAGNOSIS — I2510 Atherosclerotic heart disease of native coronary artery without angina pectoris: Secondary | ICD-10-CM | POA: Diagnosis present

## 2022-04-30 DIAGNOSIS — Z8669 Personal history of other diseases of the nervous system and sense organs: Secondary | ICD-10-CM | POA: Diagnosis not present

## 2022-04-30 DIAGNOSIS — I468 Cardiac arrest due to other underlying condition: Secondary | ICD-10-CM | POA: Diagnosis not present

## 2022-04-30 DIAGNOSIS — J189 Pneumonia, unspecified organism: Secondary | ICD-10-CM | POA: Diagnosis not present

## 2022-04-30 DIAGNOSIS — I480 Paroxysmal atrial fibrillation: Secondary | ICD-10-CM | POA: Diagnosis not present

## 2022-04-30 DIAGNOSIS — I472 Ventricular tachycardia, unspecified: Secondary | ICD-10-CM | POA: Diagnosis not present

## 2022-04-30 DIAGNOSIS — Z841 Family history of disorders of kidney and ureter: Secondary | ICD-10-CM

## 2022-04-30 DIAGNOSIS — I447 Left bundle-branch block, unspecified: Secondary | ICD-10-CM | POA: Diagnosis present

## 2022-04-30 DIAGNOSIS — G9340 Encephalopathy, unspecified: Secondary | ICD-10-CM | POA: Diagnosis not present

## 2022-04-30 DIAGNOSIS — G928 Other toxic encephalopathy: Secondary | ICD-10-CM | POA: Diagnosis present

## 2022-04-30 DIAGNOSIS — R531 Weakness: Secondary | ICD-10-CM | POA: Diagnosis not present

## 2022-04-30 DIAGNOSIS — Z91138 Patient's unintentional underdosing of medication regimen for other reason: Secondary | ICD-10-CM

## 2022-04-30 DIAGNOSIS — J690 Pneumonitis due to inhalation of food and vomit: Secondary | ICD-10-CM | POA: Diagnosis not present

## 2022-04-30 DIAGNOSIS — E785 Hyperlipidemia, unspecified: Secondary | ICD-10-CM | POA: Diagnosis not present

## 2022-04-30 DIAGNOSIS — Z8349 Family history of other endocrine, nutritional and metabolic diseases: Secondary | ICD-10-CM

## 2022-04-30 DIAGNOSIS — R0600 Dyspnea, unspecified: Secondary | ICD-10-CM | POA: Diagnosis not present

## 2022-04-30 DIAGNOSIS — E1144 Type 2 diabetes mellitus with diabetic amyotrophy: Secondary | ICD-10-CM | POA: Diagnosis not present

## 2022-04-30 DIAGNOSIS — R1311 Dysphagia, oral phase: Secondary | ICD-10-CM | POA: Diagnosis not present

## 2022-04-30 DIAGNOSIS — J811 Chronic pulmonary edema: Secondary | ICD-10-CM | POA: Diagnosis not present

## 2022-04-30 DIAGNOSIS — L8990 Pressure ulcer of unspecified site, unspecified stage: Secondary | ICD-10-CM | POA: Diagnosis present

## 2022-04-30 DIAGNOSIS — Z6837 Body mass index (BMI) 37.0-37.9, adult: Secondary | ICD-10-CM

## 2022-04-30 DIAGNOSIS — I5023 Acute on chronic systolic (congestive) heart failure: Secondary | ICD-10-CM | POA: Diagnosis not present

## 2022-04-30 DIAGNOSIS — I5043 Acute on chronic combined systolic (congestive) and diastolic (congestive) heart failure: Secondary | ICD-10-CM | POA: Diagnosis not present

## 2022-04-30 DIAGNOSIS — Z7901 Long term (current) use of anticoagulants: Secondary | ICD-10-CM | POA: Diagnosis not present

## 2022-04-30 DIAGNOSIS — Z515 Encounter for palliative care: Secondary | ICD-10-CM | POA: Diagnosis not present

## 2022-04-30 DIAGNOSIS — I428 Other cardiomyopathies: Secondary | ICD-10-CM | POA: Diagnosis present

## 2022-04-30 DIAGNOSIS — T501X6A Underdosing of loop [high-ceiling] diuretics, initial encounter: Secondary | ICD-10-CM | POA: Diagnosis present

## 2022-04-30 DIAGNOSIS — M47814 Spondylosis without myelopathy or radiculopathy, thoracic region: Secondary | ICD-10-CM | POA: Diagnosis not present

## 2022-04-30 DIAGNOSIS — J9602 Acute respiratory failure with hypercapnia: Secondary | ICD-10-CM | POA: Diagnosis not present

## 2022-04-30 DIAGNOSIS — I422 Other hypertrophic cardiomyopathy: Secondary | ICD-10-CM | POA: Diagnosis present

## 2022-04-30 DIAGNOSIS — R069 Unspecified abnormalities of breathing: Secondary | ICD-10-CM | POA: Diagnosis not present

## 2022-04-30 DIAGNOSIS — Z87891 Personal history of nicotine dependence: Secondary | ICD-10-CM | POA: Diagnosis not present

## 2022-04-30 DIAGNOSIS — J80 Acute respiratory distress syndrome: Secondary | ICD-10-CM | POA: Diagnosis not present

## 2022-04-30 DIAGNOSIS — G931 Anoxic brain damage, not elsewhere classified: Secondary | ICD-10-CM | POA: Diagnosis not present

## 2022-04-30 DIAGNOSIS — R279 Unspecified lack of coordination: Secondary | ICD-10-CM | POA: Diagnosis not present

## 2022-04-30 DIAGNOSIS — R001 Bradycardia, unspecified: Secondary | ICD-10-CM | POA: Diagnosis present

## 2022-04-30 DIAGNOSIS — J441 Chronic obstructive pulmonary disease with (acute) exacerbation: Secondary | ICD-10-CM | POA: Diagnosis present

## 2022-04-30 DIAGNOSIS — R6521 Severe sepsis with septic shock: Secondary | ICD-10-CM | POA: Diagnosis not present

## 2022-04-30 DIAGNOSIS — R471 Dysarthria and anarthria: Secondary | ICD-10-CM | POA: Diagnosis not present

## 2022-04-30 DIAGNOSIS — I255 Ischemic cardiomyopathy: Secondary | ICD-10-CM | POA: Diagnosis not present

## 2022-04-30 DIAGNOSIS — I110 Hypertensive heart disease with heart failure: Secondary | ICD-10-CM | POA: Diagnosis present

## 2022-04-30 DIAGNOSIS — J9690 Respiratory failure, unspecified, unspecified whether with hypoxia or hypercapnia: Secondary | ICD-10-CM | POA: Diagnosis not present

## 2022-04-30 DIAGNOSIS — Z7952 Long term (current) use of systemic steroids: Secondary | ICD-10-CM

## 2022-04-30 DIAGNOSIS — R4182 Altered mental status, unspecified: Secondary | ICD-10-CM | POA: Diagnosis not present

## 2022-04-30 DIAGNOSIS — J432 Centrilobular emphysema: Secondary | ICD-10-CM | POA: Diagnosis not present

## 2022-04-30 DIAGNOSIS — Z9981 Dependence on supplemental oxygen: Secondary | ICD-10-CM | POA: Diagnosis not present

## 2022-04-30 DIAGNOSIS — Z95810 Presence of automatic (implantable) cardiac defibrillator: Secondary | ICD-10-CM | POA: Diagnosis not present

## 2022-04-30 DIAGNOSIS — Z8249 Family history of ischemic heart disease and other diseases of the circulatory system: Secondary | ICD-10-CM

## 2022-04-30 DIAGNOSIS — E1122 Type 2 diabetes mellitus with diabetic chronic kidney disease: Secondary | ICD-10-CM | POA: Diagnosis not present

## 2022-04-30 DIAGNOSIS — M9979 Connective tissue and disc stenosis of intervertebral foramina of abdomen and other regions: Secondary | ICD-10-CM

## 2022-04-30 DIAGNOSIS — Z8261 Family history of arthritis: Secondary | ICD-10-CM

## 2022-04-30 DIAGNOSIS — M1990 Unspecified osteoarthritis, unspecified site: Secondary | ICD-10-CM | POA: Diagnosis present

## 2022-04-30 DIAGNOSIS — I429 Cardiomyopathy, unspecified: Secondary | ICD-10-CM | POA: Diagnosis not present

## 2022-04-30 DIAGNOSIS — Z8674 Personal history of sudden cardiac arrest: Secondary | ICD-10-CM | POA: Diagnosis not present

## 2022-04-30 DIAGNOSIS — I469 Cardiac arrest, cause unspecified: Secondary | ICD-10-CM | POA: Diagnosis not present

## 2022-04-30 DIAGNOSIS — R0789 Other chest pain: Secondary | ICD-10-CM | POA: Diagnosis not present

## 2022-04-30 DIAGNOSIS — I161 Hypertensive emergency: Secondary | ICD-10-CM | POA: Diagnosis not present

## 2022-04-30 DIAGNOSIS — R7401 Elevation of levels of liver transaminase levels: Secondary | ICD-10-CM | POA: Diagnosis present

## 2022-04-30 DIAGNOSIS — I5020 Unspecified systolic (congestive) heart failure: Secondary | ICD-10-CM | POA: Diagnosis not present

## 2022-04-30 DIAGNOSIS — S0990XA Unspecified injury of head, initial encounter: Secondary | ICD-10-CM | POA: Diagnosis not present

## 2022-04-30 DIAGNOSIS — E669 Obesity, unspecified: Secondary | ICD-10-CM | POA: Diagnosis present

## 2022-04-30 DIAGNOSIS — R451 Restlessness and agitation: Secondary | ICD-10-CM | POA: Diagnosis not present

## 2022-04-30 DIAGNOSIS — Z7984 Long term (current) use of oral hypoglycemic drugs: Secondary | ICD-10-CM

## 2022-04-30 DIAGNOSIS — N179 Acute kidney failure, unspecified: Secondary | ICD-10-CM | POA: Diagnosis not present

## 2022-04-30 DIAGNOSIS — J989 Respiratory disorder, unspecified: Principal | ICD-10-CM | POA: Diagnosis present

## 2022-04-30 DIAGNOSIS — N170 Acute kidney failure with tubular necrosis: Secondary | ICD-10-CM | POA: Diagnosis not present

## 2022-04-30 DIAGNOSIS — J90 Pleural effusion, not elsewhere classified: Secondary | ICD-10-CM | POA: Diagnosis not present

## 2022-04-30 DIAGNOSIS — Z743 Need for continuous supervision: Secondary | ICD-10-CM | POA: Diagnosis not present

## 2022-04-30 DIAGNOSIS — J810 Acute pulmonary edema: Secondary | ICD-10-CM | POA: Diagnosis not present

## 2022-04-30 DIAGNOSIS — G2581 Restless legs syndrome: Secondary | ICD-10-CM | POA: Diagnosis not present

## 2022-04-30 DIAGNOSIS — Z1152 Encounter for screening for COVID-19: Secondary | ICD-10-CM | POA: Diagnosis not present

## 2022-04-30 DIAGNOSIS — Z4682 Encounter for fitting and adjustment of non-vascular catheter: Secondary | ICD-10-CM | POA: Diagnosis not present

## 2022-04-30 DIAGNOSIS — R0902 Hypoxemia: Secondary | ICD-10-CM | POA: Diagnosis not present

## 2022-04-30 DIAGNOSIS — G7001 Myasthenia gravis with (acute) exacerbation: Secondary | ICD-10-CM | POA: Diagnosis not present

## 2022-04-30 DIAGNOSIS — L89312 Pressure ulcer of right buttock, stage 2: Secondary | ICD-10-CM | POA: Diagnosis not present

## 2022-04-30 DIAGNOSIS — M25562 Pain in left knee: Secondary | ICD-10-CM | POA: Diagnosis not present

## 2022-04-30 DIAGNOSIS — E871 Hypo-osmolality and hyponatremia: Secondary | ICD-10-CM | POA: Diagnosis not present

## 2022-04-30 DIAGNOSIS — T17890A Other foreign object in other parts of respiratory tract causing asphyxiation, initial encounter: Secondary | ICD-10-CM | POA: Diagnosis not present

## 2022-04-30 DIAGNOSIS — J9600 Acute respiratory failure, unspecified whether with hypoxia or hypercapnia: Secondary | ICD-10-CM | POA: Diagnosis not present

## 2022-04-30 DIAGNOSIS — A419 Sepsis, unspecified organism: Principal | ICD-10-CM | POA: Diagnosis present

## 2022-04-30 DIAGNOSIS — F40240 Claustrophobia: Secondary | ICD-10-CM | POA: Diagnosis present

## 2022-04-30 DIAGNOSIS — K5730 Diverticulosis of large intestine without perforation or abscess without bleeding: Secondary | ICD-10-CM | POA: Diagnosis not present

## 2022-04-30 DIAGNOSIS — G4733 Obstructive sleep apnea (adult) (pediatric): Secondary | ICD-10-CM | POA: Diagnosis present

## 2022-04-30 DIAGNOSIS — F419 Anxiety disorder, unspecified: Secondary | ICD-10-CM | POA: Diagnosis not present

## 2022-04-30 DIAGNOSIS — R319 Hematuria, unspecified: Secondary | ICD-10-CM | POA: Diagnosis not present

## 2022-04-30 DIAGNOSIS — Z823 Family history of stroke: Secondary | ICD-10-CM

## 2022-04-30 DIAGNOSIS — E559 Vitamin D deficiency, unspecified: Secondary | ICD-10-CM | POA: Diagnosis not present

## 2022-04-30 DIAGNOSIS — Z7951 Long term (current) use of inhaled steroids: Secondary | ICD-10-CM

## 2022-04-30 DIAGNOSIS — J9811 Atelectasis: Secondary | ICD-10-CM | POA: Diagnosis not present

## 2022-04-30 DIAGNOSIS — Z79899 Other long term (current) drug therapy: Secondary | ICD-10-CM

## 2022-04-30 DIAGNOSIS — R079 Chest pain, unspecified: Secondary | ICD-10-CM | POA: Diagnosis not present

## 2022-04-30 DIAGNOSIS — R0603 Acute respiratory distress: Secondary | ICD-10-CM | POA: Diagnosis not present

## 2022-04-30 DIAGNOSIS — R918 Other nonspecific abnormal finding of lung field: Secondary | ICD-10-CM | POA: Diagnosis not present

## 2022-04-30 DIAGNOSIS — I420 Dilated cardiomyopathy: Secondary | ICD-10-CM | POA: Diagnosis not present

## 2022-04-30 DIAGNOSIS — E538 Deficiency of other specified B group vitamins: Secondary | ICD-10-CM | POA: Diagnosis not present

## 2022-04-30 DIAGNOSIS — F4310 Post-traumatic stress disorder, unspecified: Secondary | ICD-10-CM | POA: Diagnosis present

## 2022-04-30 DIAGNOSIS — J9601 Acute respiratory failure with hypoxia: Secondary | ICD-10-CM | POA: Diagnosis present

## 2022-04-30 DIAGNOSIS — I440 Atrioventricular block, first degree: Secondary | ICD-10-CM | POA: Diagnosis not present

## 2022-04-30 DIAGNOSIS — I248 Other forms of acute ischemic heart disease: Secondary | ICD-10-CM | POA: Diagnosis present

## 2022-04-30 DIAGNOSIS — I5042 Chronic combined systolic (congestive) and diastolic (congestive) heart failure: Secondary | ICD-10-CM | POA: Diagnosis not present

## 2022-04-30 HISTORY — DX: Cardiac arrest due to other underlying condition: I46.8

## 2022-04-30 LAB — CBC WITH DIFFERENTIAL/PLATELET
Abs Immature Granulocytes: 0.25 10*3/uL — ABNORMAL HIGH (ref 0.00–0.07)
Abs Immature Granulocytes: 0.14 10*3/uL — ABNORMAL HIGH (ref 0.00–0.07)
Basophils Absolute: 0 10*3/uL (ref 0.0–0.1)
Basophils Absolute: 0 10*3/uL (ref 0.0–0.1)
Basophils Relative: 0 %
Basophils Relative: 0 %
Eosinophils Absolute: 0 10*3/uL (ref 0.0–0.5)
Eosinophils Absolute: 0 10*3/uL (ref 0.0–0.5)
Eosinophils Relative: 0 %
Eosinophils Relative: 0 %
HCT: 57.7 % — ABNORMAL HIGH (ref 39.0–52.0)
HCT: 54.8 % — ABNORMAL HIGH (ref 39.0–52.0)
Hemoglobin: 16.6 g/dL (ref 13.0–17.0)
Hemoglobin: 16.3 g/dL (ref 13.0–17.0)
Immature Granulocytes: 2 %
Immature Granulocytes: 1 %
Lymphocytes Relative: 17 %
Lymphocytes Relative: 11 %
Lymphs Abs: 2.1 10*3/uL (ref 0.7–4.0)
Lymphs Abs: 2.1 10*3/uL (ref 0.7–4.0)
MCH: 30 pg (ref 26.0–34.0)
MCH: 29.7 pg (ref 26.0–34.0)
MCHC: 28.8 g/dL — ABNORMAL LOW (ref 30.0–36.0)
MCHC: 29.7 g/dL — ABNORMAL LOW (ref 30.0–36.0)
MCV: 104.3 fL — ABNORMAL HIGH (ref 80.0–100.0)
MCV: 100 fL (ref 80.0–100.0)
Monocytes Absolute: 0.7 10*3/uL (ref 0.1–1.0)
Monocytes Absolute: 0.8 10*3/uL (ref 0.1–1.0)
Monocytes Relative: 5 %
Monocytes Relative: 4 %
Neutro Abs: 9.4 10*3/uL — ABNORMAL HIGH (ref 1.7–7.7)
Neutro Abs: 15.8 10*3/uL — ABNORMAL HIGH (ref 1.7–7.7)
Neutrophils Relative %: 76 %
Neutrophils Relative %: 84 %
Platelets: 189 10*3/uL (ref 150–400)
Platelets: 146 10*3/uL — ABNORMAL LOW (ref 150–400)
RBC: 5.53 MIL/uL (ref 4.22–5.81)
RBC: 5.48 MIL/uL (ref 4.22–5.81)
RDW: 12.9 % (ref 11.5–15.5)
RDW: 13.2 % (ref 11.5–15.5)
WBC: 12.4 10*3/uL — ABNORMAL HIGH (ref 4.0–10.5)
WBC: 18.9 10*3/uL — ABNORMAL HIGH (ref 4.0–10.5)
nRBC: 0.4 % — ABNORMAL HIGH (ref 0.0–0.2)
nRBC: 0.2 % (ref 0.0–0.2)

## 2022-04-30 LAB — ECHOCARDIOGRAM COMPLETE
AR max vel: 2.3 cm2
AV Peak grad: 3.4 mmHg
Ao pk vel: 0.92 m/s
Area-P 1/2: 3.26 cm2
Calc EF: 46.5 %
S' Lateral: 3.5 cm
Single Plane A2C EF: 47.6 %
Single Plane A4C EF: 45.7 %

## 2022-04-30 LAB — URINE DRUG SCREEN, QUALITATIVE (ARMC ONLY)
Amphetamines, Ur Screen: NOT DETECTED
Barbiturates, Ur Screen: NOT DETECTED
Benzodiazepine, Ur Scrn: NOT DETECTED
Cannabinoid 50 Ng, Ur ~~LOC~~: NOT DETECTED
Cocaine Metabolite,Ur ~~LOC~~: NOT DETECTED
MDMA (Ecstasy)Ur Screen: NOT DETECTED
Methadone Scn, Ur: NOT DETECTED
Opiate, Ur Screen: POSITIVE — AB
Phencyclidine (PCP) Ur S: NOT DETECTED
Tricyclic, Ur Screen: NOT DETECTED

## 2022-04-30 LAB — URINALYSIS, COMPLETE (UACMP) WITH MICROSCOPIC
Bacteria, UA: NONE SEEN
Bilirubin Urine: NEGATIVE
Glucose, UA: >=500 mg/dL — AB
Ketones, ur: NEGATIVE mg/dL
Leukocytes,Ua: NEGATIVE
Nitrite: NEGATIVE
Protein, ur: 100 mg/dL — AB
RBC / HPF: >50 RBC/hpf — ABNORMAL HIGH (ref 0–5)
Specific Gravity, Urine: 1.017 (ref 1.005–1.030)
WBC, UA: >50 WBC/hpf — ABNORMAL HIGH (ref 0–5)
pH: 5 (ref 5.0–8.0)

## 2022-04-30 LAB — COMPREHENSIVE METABOLIC PANEL
ALT: 657 U/L — ABNORMAL HIGH (ref 0–44)
ALT: 1349 U/L — ABNORMAL HIGH (ref 0–44)
AST: 702 U/L — ABNORMAL HIGH (ref 15–41)
AST: 1750 U/L — ABNORMAL HIGH (ref 15–41)
Albumin: 3.3 g/dL — ABNORMAL LOW (ref 3.5–5.0)
Albumin: 3.1 g/dL — ABNORMAL LOW (ref 3.5–5.0)
Alkaline Phosphatase: 40 U/L (ref 38–126)
Alkaline Phosphatase: 38 U/L (ref 38–126)
Anion gap: 10 (ref 5–15)
Anion gap: 11 (ref 5–15)
BUN: 37 mg/dL — ABNORMAL HIGH (ref 8–23)
BUN: 44 mg/dL — ABNORMAL HIGH (ref 8–23)
CO2: 25 mmol/L (ref 22–32)
CO2: 26 mmol/L (ref 22–32)
Calcium: 7.7 mg/dL — ABNORMAL LOW (ref 8.9–10.3)
Calcium: 7.4 mg/dL — ABNORMAL LOW (ref 8.9–10.3)
Chloride: 104 mmol/L (ref 98–111)
Chloride: 104 mmol/L (ref 98–111)
Creatinine, Ser: 2.27 mg/dL — ABNORMAL HIGH (ref 0.61–1.24)
Creatinine, Ser: 3.18 mg/dL — ABNORMAL HIGH (ref 0.61–1.24)
GFR, Estimated: 29 mL/min — ABNORMAL LOW (ref >60)
GFR, Estimated: 20 mL/min — ABNORMAL LOW (ref >60)
Glucose, Bld: 214 mg/dL — ABNORMAL HIGH (ref 70–99)
Glucose, Bld: 170 mg/dL — ABNORMAL HIGH (ref 70–99)
Potassium: 4.4 mmol/L (ref 3.5–5.1)
Potassium: 4.5 mmol/L (ref 3.5–5.1)
Sodium: 139 mmol/L (ref 135–145)
Sodium: 141 mmol/L (ref 135–145)
Total Bilirubin: 1.4 mg/dL — ABNORMAL HIGH (ref 0.3–1.2)
Total Bilirubin: 1.7 mg/dL — ABNORMAL HIGH (ref 0.3–1.2)
Total Protein: 6 g/dL — ABNORMAL LOW (ref 6.5–8.1)
Total Protein: 5.9 g/dL — ABNORMAL LOW (ref 6.5–8.1)

## 2022-04-30 LAB — BLOOD GAS, ARTERIAL
Acid-base deficit: 2.1 mmol/L — ABNORMAL HIGH (ref 0.0–2.0)
Acid-base deficit: 1.9 mmol/L (ref 0.0–2.0)
Bicarbonate: 26 mmol/L (ref 20.0–28.0)
Bicarbonate: 24.3 mmol/L (ref 20.0–28.0)
FIO2: 100 %
FIO2: 100 %
MECHVT: 500 mL
MECHVT: 500 mL
Mechanical Rate: 24
O2 Saturation: 94.9 %
O2 Saturation: 95.3 %
PEEP: 10 cmH2O
PEEP: 8 cmH2O
Patient temperature: 37
Patient temperature: 37
RATE: 20 resp/min
pCO2 arterial: 58 mmHg — ABNORMAL HIGH (ref 32–48)
pCO2 arterial: 46 mmHg (ref 32–48)
pH, Arterial: 7.26 — ABNORMAL LOW (ref 7.35–7.45)
pH, Arterial: 7.33 — ABNORMAL LOW (ref 7.35–7.45)
pO2, Arterial: 77 mmHg — ABNORMAL LOW (ref 83–108)
pO2, Arterial: 71 mmHg — ABNORMAL LOW (ref 83–108)

## 2022-04-30 LAB — GLUCOSE, CAPILLARY
Glucose-Capillary: 147 mg/dL — ABNORMAL HIGH (ref 70–99)
Glucose-Capillary: 153 mg/dL — ABNORMAL HIGH (ref 70–99)
Glucose-Capillary: 166 mg/dL — ABNORMAL HIGH (ref 70–99)
Glucose-Capillary: 188 mg/dL — ABNORMAL HIGH (ref 70–99)
Glucose-Capillary: 192 mg/dL — ABNORMAL HIGH (ref 70–99)
Glucose-Capillary: 209 mg/dL — ABNORMAL HIGH (ref 70–99)

## 2022-04-30 LAB — TYPE AND SCREEN
ABO/RH(D): A POS
Antibody Screen: NEGATIVE

## 2022-04-30 LAB — TROPONIN I (HIGH SENSITIVITY)
Troponin I (High Sensitivity): 373 ng/L (ref <18)
Troponin I (High Sensitivity): 1137 ng/L (ref <18)
Troponin I (High Sensitivity): 2844 ng/L (ref <18)
Troponin I (High Sensitivity): 2210 ng/L (ref <18)

## 2022-04-30 LAB — RESPIRATORY PANEL BY PCR

## 2022-04-30 LAB — HEMOGLOBIN A1C
Hgb A1c MFr Bld: 6.9 % — ABNORMAL HIGH (ref 4.8–5.6)
Mean Plasma Glucose: 151.33 mg/dL

## 2022-04-30 LAB — PROTIME-INR
INR: 2.1 — ABNORMAL HIGH (ref 0.8–1.2)
INR: 3.1 — ABNORMAL HIGH (ref 0.8–1.2)
Prothrombin Time: 23 seconds — ABNORMAL HIGH (ref 11.4–15.2)
Prothrombin Time: 31.8 seconds — ABNORMAL HIGH (ref 11.4–15.2)

## 2022-04-30 LAB — LACTIC ACID, PLASMA
Lactic Acid, Venous: 2.7 mmol/L (ref 0.5–1.9)
Lactic Acid, Venous: 2.2 mmol/L (ref 0.5–1.9)
Lactic Acid, Venous: 4.2 mmol/L (ref 0.5–1.9)
Lactic Acid, Venous: 2.8 mmol/L (ref 0.5–1.9)

## 2022-04-30 LAB — PROCALCITONIN: Procalcitonin: 0.24 ng/mL

## 2022-04-30 LAB — BRAIN NATRIURETIC PEPTIDE: B Natriuretic Peptide: 971.6 pg/mL — ABNORMAL HIGH (ref 0.0–100.0)

## 2022-04-30 LAB — RESP PANEL BY RT-PCR (FLU A&B, COVID) ARPGX2
Influenza A by PCR: NEGATIVE
Influenza B by PCR: NEGATIVE
SARS Coronavirus 2 by RT PCR: NEGATIVE

## 2022-04-30 LAB — APTT
aPTT: 33 seconds (ref 24–36)
aPTT: 110 seconds — ABNORMAL HIGH (ref 24–36)

## 2022-04-30 LAB — TSH: TSH: 0.441 u[IU]/mL (ref 0.350–4.500)

## 2022-04-30 LAB — MRSA NEXT GEN BY PCR, NASAL: MRSA by PCR Next Gen: NOT DETECTED

## 2022-04-30 LAB — PHOSPHORUS
Phosphorus: 6.6 mg/dL — ABNORMAL HIGH (ref 2.5–4.6)
Phosphorus: 4.7 mg/dL — ABNORMAL HIGH (ref 2.5–4.6)

## 2022-04-30 LAB — MAGNESIUM
Magnesium: 3 mg/dL — ABNORMAL HIGH (ref 1.7–2.4)
Magnesium: 2.3 mg/dL (ref 1.7–2.4)

## 2022-04-30 LAB — T4, FREE: Free T4: 0.88 ng/dL (ref 0.61–1.12)

## 2022-04-30 MED ORDER — CHLORHEXIDINE GLUCONATE CLOTH 2 % EX PADS
6.0000 | MEDICATED_PAD | Freq: Every day | CUTANEOUS | Status: DC
  Administered 2022-04-30 – 2022-05-08 (×10): 6 via TOPICAL

## 2022-04-30 MED ORDER — MIDAZOLAM HCL 2 MG/2ML IJ SOLN
1.0000 mg | INTRAMUSCULAR | Status: DC | PRN
  Administered 2022-04-30: 1 mg via INTRAVENOUS
  Filled 2022-04-30 (×2): qty 2

## 2022-04-30 MED ORDER — ATROPINE SULFATE 1 MG/ML IV SOLN
0.5000 mg | INTRAVENOUS | Status: DC | PRN
  Filled 2022-04-30 (×2): qty 0.5

## 2022-04-30 MED ORDER — VASOPRESSIN 20 UNITS/100 ML INFUSION FOR SHOCK
0.0000 [IU]/min | INTRAVENOUS | Status: DC
  Administered 2022-04-30 – 2022-05-01 (×3): 0.03 [IU]/min via INTRAVENOUS
  Filled 2022-04-30 (×2): qty 100

## 2022-04-30 MED ORDER — MIDAZOLAM-SODIUM CHLORIDE 100-0.9 MG/100ML-% IV SOLN
0.0000 mg/h | INTRAVENOUS | Status: DC
  Administered 2022-04-30: 1 mg/h via INTRAVENOUS
  Administered 2022-05-02 (×2): 4 mg/h via INTRAVENOUS
  Filled 2022-04-30 (×2): qty 100

## 2022-04-30 MED ORDER — IPRATROPIUM-ALBUTEROL 0.5-2.5 (3) MG/3ML IN SOLN
3.0000 mL | Freq: Four times a day (QID) | RESPIRATORY_TRACT | Status: DC
  Administered 2022-04-30 – 2022-05-06 (×25): 3 mL via RESPIRATORY_TRACT
  Filled 2022-04-30 (×25): qty 3

## 2022-04-30 MED ORDER — MIDAZOLAM HCL 2 MG/2ML IJ SOLN
2.0000 mg | Freq: Once | INTRAMUSCULAR | Status: AC
  Administered 2022-04-30: 2 mg via INTRAVENOUS

## 2022-04-30 MED ORDER — VASOPRESSIN 20 UNITS/100 ML INFUSION FOR SHOCK
0.0000 [IU]/min | INTRAVENOUS | Status: DC
  Filled 2022-04-30: qty 100

## 2022-04-30 MED ORDER — FENTANYL CITRATE PF 50 MCG/ML IJ SOSY: 25.0000 ug | PREFILLED_SYRINGE | INTRAMUSCULAR | Status: DC | PRN

## 2022-04-30 MED ORDER — HEPARIN (PORCINE) 25000 UT/250ML-% IV SOLN
1550.0000 [IU]/h | INTRAVENOUS | Status: DC
  Administered 2022-04-30: 1300 [IU]/h via INTRAVENOUS
  Administered 2022-05-01 – 2022-05-02 (×2): 1200 [IU]/h via INTRAVENOUS
  Administered 2022-05-02 – 2022-05-05 (×4): 1650 [IU]/h via INTRAVENOUS
  Filled 2022-04-30 (×7): qty 250

## 2022-04-30 MED ORDER — MIDAZOLAM BOLUS VIA INFUSION
1.0000 mg | INTRAVENOUS | Status: DC | PRN
  Administered 2022-04-30 – 2022-05-01 (×2): 2 mg via INTRAVENOUS
  Administered 2022-05-01: 1 mg via INTRAVENOUS
  Administered 2022-05-01 – 2022-05-02 (×5): 2 mg via INTRAVENOUS

## 2022-04-30 MED ORDER — HEPARIN SODIUM (PORCINE) 5000 UNIT/ML IJ SOLN
5000.0000 [IU] | Freq: Three times a day (TID) | INTRAMUSCULAR | Status: DC
  Administered 2022-04-30: 5000 [IU] via SUBCUTANEOUS
  Filled 2022-04-30: qty 1

## 2022-04-30 MED ORDER — NOREPINEPHRINE 16 MG/250ML-% IV SOLN
0.0000 ug/min | INTRAVENOUS | Status: DC
  Administered 2022-04-30: 22 ug/min via INTRAVENOUS
  Filled 2022-04-30: qty 250

## 2022-04-30 MED ORDER — NOREPINEPHRINE 4 MG/250ML-% IV SOLN
0.0000 ug/min | INTRAVENOUS | Status: DC
  Administered 2022-04-30: 5 ug/min via INTRAVENOUS

## 2022-04-30 MED ORDER — NITROGLYCERIN IN D5W 200-5 MCG/ML-% IV SOLN
INTRAVENOUS | Status: AC
  Filled 2022-04-30: qty 250

## 2022-04-30 MED ORDER — EPINEPHRINE 1 MG/10ML IJ SOSY
PREFILLED_SYRINGE | INTRAMUSCULAR | Status: AC
Start: 2022-04-30 — End: 2022-04-30
  Filled 2022-04-30: qty 10

## 2022-04-30 MED ORDER — FENTANYL CITRATE PF 50 MCG/ML IJ SOSY
25.0000 ug | PREFILLED_SYRINGE | INTRAMUSCULAR | Status: DC | PRN
  Administered 2022-04-30 – 2022-05-01 (×3): 50 ug via INTRAVENOUS
  Administered 2022-05-02: 75 ug via INTRAVENOUS
  Administered 2022-05-02: 50 ug via INTRAVENOUS
  Filled 2022-04-30: qty 2
  Filled 2022-04-30: qty 1

## 2022-04-30 MED ORDER — SODIUM CHLORIDE 0.9 % IV SOLN
500.0000 mg | INTRAVENOUS | Status: DC
  Administered 2022-04-30: 500 mg via INTRAVENOUS
  Filled 2022-04-30: qty 500
  Filled 2022-04-30: qty 5

## 2022-04-30 MED ORDER — SODIUM CHLORIDE 0.9 % IV BOLUS
1000.0000 mL | Freq: Once | INTRAVENOUS | Status: AC
  Administered 2022-04-30: 1000 mL via INTRAVENOUS

## 2022-04-30 MED ORDER — MIDAZOLAM HCL 2 MG/2ML IJ SOLN
1.0000 mg | INTRAMUSCULAR | Status: AC | PRN
  Administered 2022-04-30 (×3): 1 mg via INTRAVENOUS
  Filled 2022-04-30 (×3): qty 2

## 2022-04-30 MED ORDER — INSULIN ASPART 100 UNIT/ML IJ SOLN
0.0000 [IU] | INTRAMUSCULAR | Status: DC
  Administered 2022-04-30: 3 [IU] via SUBCUTANEOUS
  Administered 2022-04-30: 4 [IU] via SUBCUTANEOUS
  Administered 2022-04-30: 7 [IU] via SUBCUTANEOUS
  Administered 2022-04-30 (×2): 4 [IU] via SUBCUTANEOUS
  Administered 2022-05-01: 3 [IU] via SUBCUTANEOUS
  Administered 2022-05-01: 4 [IU] via SUBCUTANEOUS
  Administered 2022-05-01: 3 [IU] via SUBCUTANEOUS
  Administered 2022-05-01 (×2): 4 [IU] via SUBCUTANEOUS
  Administered 2022-05-02 – 2022-05-04 (×13): 3 [IU] via SUBCUTANEOUS
  Administered 2022-05-04: 4 [IU] via SUBCUTANEOUS
  Administered 2022-05-04: 3 [IU] via SUBCUTANEOUS
  Administered 2022-05-04 – 2022-05-05 (×2): 4 [IU] via SUBCUTANEOUS
  Administered 2022-05-05: 3 [IU] via SUBCUTANEOUS
  Administered 2022-05-05: 4 [IU] via SUBCUTANEOUS
  Administered 2022-05-05: 7 [IU] via SUBCUTANEOUS
  Administered 2022-05-05 (×3): 4 [IU] via SUBCUTANEOUS
  Administered 2022-05-06 – 2022-05-07 (×5): 3 [IU] via SUBCUTANEOUS
  Filled 2022-04-30 (×39): qty 1

## 2022-04-30 MED ORDER — METHYLPREDNISOLONE SODIUM SUCC 40 MG IJ SOLR
40.0000 mg | Freq: Two times a day (BID) | INTRAMUSCULAR | Status: DC
  Administered 2022-04-30 – 2022-05-02 (×5): 40 mg via INTRAVENOUS
  Filled 2022-04-30 (×5): qty 1

## 2022-04-30 MED ORDER — PERFLUTREN LIPID MICROSPHERE
1.0000 mL | INTRAVENOUS | Status: AC | PRN
  Administered 2022-04-30: 5 mL via INTRAVENOUS

## 2022-04-30 MED ORDER — ATROPINE SULFATE 1 MG/ML IV SOLN
1.0000 mg | Freq: Once | INTRAVENOUS | Status: AC
Start: 2022-04-30 — End: 2022-04-30
  Administered 2022-04-30: 1 mg via INTRAVENOUS

## 2022-04-30 MED ORDER — EPINEPHRINE 1 MG/10ML IJ SOSY
1.0000 mg | PREFILLED_SYRINGE | Freq: Once | INTRAMUSCULAR | Status: AC
  Administered 2022-04-30: 1 mg via INTRAVENOUS

## 2022-04-30 MED ORDER — METHYLPREDNISOLONE SODIUM SUCC 40 MG IJ SOLR: 40.0000 mg | Freq: Two times a day (BID) | INTRAMUSCULAR | Status: DC

## 2022-04-30 MED ORDER — PROPOFOL 1000 MG/100ML IV EMUL
INTRAVENOUS | Status: AC
  Administered 2022-04-30: 20 ug/kg/min
  Filled 2022-04-30: qty 100

## 2022-04-30 MED ORDER — HEPARIN BOLUS VIA INFUSION
4000.0000 [IU] | Freq: Once | INTRAVENOUS | Status: AC
  Administered 2022-04-30: 4000 [IU] via INTRAVENOUS
  Filled 2022-04-30: qty 4000

## 2022-04-30 MED ORDER — FUROSEMIDE 10 MG/ML IJ SOLN
INTRAMUSCULAR | Status: AC
  Administered 2022-04-30: 80 mg
  Filled 2022-04-30: qty 8

## 2022-04-30 MED ORDER — PROPOFOL 1000 MG/100ML IV EMUL
5.0000 ug/kg/min | INTRAVENOUS | Status: DC
  Administered 2022-04-30: 10 ug/kg/min via INTRAVENOUS

## 2022-04-30 MED ORDER — FLECAINIDE ACETATE 50 MG PO TABS
50.0000 mg | ORAL_TABLET | Freq: Two times a day (BID) | ORAL | Status: DC
  Administered 2022-04-30 (×2): 50 mg
  Filled 2022-04-30 (×3): qty 1

## 2022-04-30 MED ORDER — PANTOPRAZOLE 2 MG/ML SUSPENSION
40.0000 mg | Freq: Every day | ORAL | Status: DC
  Administered 2022-05-01: 40 mg
  Filled 2022-04-30 (×2): qty 20

## 2022-04-30 MED ORDER — EPINEPHRINE HCL 5 MG/250ML IV SOLN IN NS
0.5000 ug/min | INTRAVENOUS | Status: DC
  Administered 2022-04-30: 10 ug/min via INTRAVENOUS
  Filled 2022-04-30: qty 250

## 2022-04-30 MED ORDER — BUDESONIDE 0.25 MG/2ML IN SUSP
0.2500 mg | Freq: Two times a day (BID) | RESPIRATORY_TRACT | Status: DC
  Administered 2022-04-30 – 2022-05-09 (×19): 0.25 mg via RESPIRATORY_TRACT
  Filled 2022-04-30 (×19): qty 2

## 2022-04-30 MED ORDER — POLYETHYLENE GLYCOL 3350 17 G PO PACK
17.0000 g | PACK | Freq: Every day | ORAL | Status: DC
  Administered 2022-05-01 – 2022-05-05 (×3): 17 g
  Filled 2022-04-30 (×3): qty 1

## 2022-04-30 MED ORDER — FENTANYL 2500MCG IN NS 250ML (10MCG/ML) PREMIX INFUSION
0.0000 ug/h | INTRAVENOUS | Status: DC
  Administered 2022-04-30: 25 ug/h via INTRAVENOUS
  Administered 2022-05-02 (×2): 100 ug/h via INTRAVENOUS
  Filled 2022-04-30 (×2): qty 250

## 2022-04-30 MED ORDER — HYDROCORTISONE SOD SUC (PF) 100 MG IJ SOLR: 100.0000 mg | Freq: Three times a day (TID) | INTRAMUSCULAR | Status: DC

## 2022-04-30 MED ORDER — DOCUSATE SODIUM 50 MG/5ML PO LIQD: 100.0000 mg | Freq: Two times a day (BID) | ORAL | Status: DC | PRN

## 2022-04-30 MED ORDER — DOCUSATE SODIUM 50 MG/5ML PO LIQD
100.0000 mg | Freq: Two times a day (BID) | ORAL | Status: DC
  Administered 2022-04-30 – 2022-05-05 (×8): 100 mg
  Filled 2022-04-30 (×8): qty 10

## 2022-04-30 MED ORDER — SODIUM CHLORIDE 0.9 % IV SOLN
3.0000 g | Freq: Four times a day (QID) | INTRAVENOUS | Status: DC
  Administered 2022-04-30 – 2022-05-01 (×3): 3 g via INTRAVENOUS
  Filled 2022-04-30 (×2): qty 3
  Filled 2022-04-30 (×3): qty 8
  Filled 2022-04-30: qty 3

## 2022-04-30 MED ORDER — POLYETHYLENE GLYCOL 3350 17 G PO PACK: 17.0000 g | PACK | Freq: Every day | ORAL | Status: DC | PRN

## 2022-04-30 MED ORDER — PROPOFOL 1000 MG/100ML IV EMUL
INTRAVENOUS | Status: AC
  Filled 2022-04-30: qty 100

## 2022-04-30 MED ORDER — ACETAMINOPHEN 10 MG/ML IV SOLN
1000.0000 mg | Freq: Four times a day (QID) | INTRAVENOUS | Status: AC | PRN
  Administered 2022-04-30: 1000 mg via INTRAVENOUS
  Filled 2022-04-30 (×2): qty 100

## 2022-04-30 MED ORDER — IPRATROPIUM-ALBUTEROL 0.5-2.5 (3) MG/3ML IN SOLN
RESPIRATORY_TRACT | Status: AC
  Administered 2022-04-30: 3 mL
  Filled 2022-04-30: qty 9

## 2022-04-30 MED ORDER — PROPOFOL 10 MG/ML IV BOLUS
INTRAVENOUS | Status: AC
  Filled 2022-04-30: qty 20

## 2022-04-30 MED ORDER — EPINEPHRINE 1 MG/10ML IJ SOSY
1.0000 mg | PREFILLED_SYRINGE | Freq: Once | INTRAMUSCULAR | Status: AC
Start: 2022-04-30 — End: 2022-04-30
  Administered 2022-04-30: 1 mg via INTRAVENOUS

## 2022-04-30 NOTE — Progress Notes (Signed)
Initial Nutrition Assessment  DOCUMENTATION CODES:   Obesity unspecified  INTERVENTION:   -If unable to extubate within 48 hours, consider initiation of enteral nutrition:  Initiate Vital High Protein @ 20 ml/hr via OGT and increase by 10 ml every 4 hours to goal rate of 50 ml/hr.   45 ml Prosource TF TID.    30 ml free water flush every 4 hours for tube patency  Tube feeding regimen provides 1320 kcal (100% of needs), 138 grams of protein, and 1003 ml of H2O. Total free water: 1183 ml daily  NUTRITION DIAGNOSIS:   Inadequate oral intake related to inability to eat as evidenced by NPO status.  GOAL:   Provide needs based on ASPEN/SCCM guidelines  MONITOR:   Vent status  REASON FOR ASSESSMENT:   Ventilator    ASSESSMENT:   Pt presented with cardiac arrest. PMH HTN, HLD, MG, COPD, and arthritis.  Pt admitted with cardiac arrest.   Patient is currently intubated on ventilator support. OGT placement verified in stomach per KUB.  MV: 11.6 L/min Temp (24hrs), Avg:100.5 F (38.1 C), Min:98.4 F (36.9 C), Max:100.9 F (38.3 C)  Reviewed I/O's: +11 ml x 24 hours  UOP: 10 ml x 24 hours  MAP: 71  Pt with 8 minute downtime.   Per neurology notes, pt with persistent encephalopathy after cardiac arrest. Plan for EEG and CT of head in AM.   Case discussed with MD via secure chat regarding possibility of starting TF. Per MD, no plan for TF today, he is on pressors and not very responsive. Plan to reassess nutrition plan tomorrow AM.   Reviewed wt hx; wt has been stable over the past year.  Medications reviewed and include colace, solu-medrol, miralax, levophed, and vasopressin.   Lab Results  Component Value Date   HGBA1C 6.9 (H) 04/30/2022   PTA DM medications are 25 mg empagliflozin daily.   Labs reviewed: Phos: 6.6, CBGS: 153 (inpatient orders for glycemic control are 0-20 units insulin aspart every 4 hours).    Diet Order:   Diet Order             Diet  NPO time specified  Diet effective now                   EDUCATION NEEDS:   No education needs have been identified at this time  Skin:  Skin Assessment: Reviewed RN Assessment  Last BM:  Unknown  Height:   Ht Readings from Last 1 Encounters:  04/03/22 '5\' 7"'$  (1.702 m)    Weight:   Wt Readings from Last 1 Encounters:  04/30/22 108 kg    Ideal Body Weight:  67.3 kg  BMI:  Body mass index is 37.29 kg/m.  Estimated Nutritional Needs:   Kcal:  2458-0998  Protein:  > 135 grams  Fluid:  > 1.2 L    Loistine Chance, RD, LDN, [place] Registered Dietitian II Certified Diabetes Care and Education Specialist Please refer to [facility] for RD and/or RD on-call/weekend/after hours pager

## 2022-04-30 NOTE — H&P (Signed)
NAME:  Joseph Macdonald, MRN:  [id number], DOB:  [birth year], LOS: 0 ADMISSION DATE:  04/30/2022, CONSULTATION DATE:  04/30/22 REFERRING MD:  Dr. Alfred Levins, CHIEF COMPLAINT:  Respiratory distress   History of Present Illness:  76 yo M presenting to [facility] ED via EMS from home on 04/30/22 in respiratory distress. Per documentation patient was found by EMS hypoxic with SpO2 71%-88% placed on NRB with albuterol administered. Family reports the patient was in his normal state of health but had NOT been taking his Lasix prescription. They believe he was taking the rest of his medication as prescribed but did not take his nighttime medications on Saturday. Family denies fever/chills, vomiting, slurred speech, weakness. They did report he had some nausea, one episode of diarrhea and mild nasal congestion as well as a non productive cough. They report he has chronic shortness of breath which started to appear more severe on Saturday.  They confirmed that the patient does not wear oxygen at home. Per report he quit smoking in 2018 after a 50 pack year history, drinks ETOH occasionally and has no recreational drug use history.   ED course: Upon arrival, per EDP report the patient was in severe respiratory distress and "purple". Before they were able to intervene, patient lost pulses. He was not on telemetry yet and it was unclear what was the initial rhythm. Patient received CPR for 8 minutes no defibrillations, and was emergently intubated requiring mechanical ventilatory support. Upon intubation, EDP noted frothy respiratory secretions. ROSC obtained and patient extremely hypertensive with SBP in the 200's. Lasix given and propofol started. SBP subsequently dropped into the 60's, propofol stopped and levophed started. EKG showing some inferolateral STE, Dr. Alfred Levins spoke with STEMI cardiologist Dr. Fletcher Anon who reviewed EKG and determined the patient did not meet STEMI criteria. Medications given: 80 mg Lasix IV,  propofol started then stopped, levophed drip started  Significant labs: (Labs/ Imaging personally reviewed) I, Domingo Pulse Rust-Chester, AGACNP-BC, personally viewed and interpreted this ECG. EKG Interpretation: Date: 04/30/22, EKG Time: 03:38, Rate: 63, Rhythm: NSR, QRS Axis:  LAD, Intervals: 1st degree HB, ST/T Wave abnormalities: Inferolateral STE, non specific T wave inversions , Narrative Interpretation: NSR with 1st dgree HB & LAD with inferolateral STE that does not meet STEMI criteria per cardiology review Chemistry: pending Hematology: mild leukocytosis WBC: 12.4, Hgb: 16.6,  Troponin: 373, BNP: 971.6, lactic/PCT: pending,  COVID-19 & Influenza A/B: pending ABG: pending CXR 04/30/22: Mild interstitial pulmonary edema  PCCM consulted for admission due to acute hypoxic respiratory failure and subsequent cardiac arrest with circulatory shock requiring mechanical ventilation and vasopressor support.  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  Interim History / Subjective:  Patient unresponsive intubated on mechanical ventilation with family bedside.  - levophed at 20 mcg Patient with worsening bradycardia, epinephrine drip started and 1 mg atropine given to stabilize for transfer to ICU Unable to perform any imaging at this time due to instability. Confirmed FULL CODE status with wife and son bedside. Discussed plan of care including emergent line placement, all questions answered at this time.  Objective   Blood pressure (!) 69/54, pulse 62, temperature 98.4 F (36.9 C), temperature source Bladder, resp.  rate  20, SpO2 93 %.       No intake or output data in the 24 hours ending 04/30/22 0344 There were no vitals filed for this visit.  Examination: General: Adult male, critically ill, lying in bed intubated & sedated requiring mechanical ventilation NAD HEENT: MM pale/moist, anicteric, atraumatic, neck supple Neuro: RASS: -5, unable to follow commands, PERRL +1 sluggish  CV: s1s2 regular, sinus bradycardia on monitor, no r/m/g Pulm: Regular, non labored on AC 100% PEEP 5, breath sounds coarse-BUL & diminished-BLL GI: soft, distended, bs x 4 GU: foley in place with clear yellow urine Skin: limited exam, dusky coloring, scattered petechiae- no rashes/lesions noted Extremities: warm/dry, pulses + 2 R/P, +4 edema noted BLE, generalized +1 edema  Resolv/ed Hospital Problem list     Assessment & Plan:  Cardiac arrest: unknown initial rhythm   Circulatory shock Acute on Chronic combined systolic and diastolic CHF exacerbation Atrial Fibrillation PMHx: HTN, HLD, combined CHF, A-fib on Eliquis, HCM 8 minutes downtime, CPR initiated immediately, received no defibrillations  UDS: pending - not a candidate for Normothermia Protocol as driving issue appears to be pulmonary with unknown initial rhythm - assess CVP Q 4 H - Continue vasopressors: continue levophed, add vasopressin PRN, to maintain MAP > 65 - Echocardiogram ordered - Trend troponin, lactic - Strict I/O's: alert provider if UOP < 0.5 mL/kg/hr - Diurese with the use of IV lasix as BP and renal fxn allow - Cardiology consulted, appreciate input - continuous cardiac monitoring - start heparin drip per pharmacy protocol once CT head obtained - f/u CT chest wo contrast   Acute hypoxic respiratory failure suspect secondary to pulmonary edema vs muscle weakness? in the setting of COPD, combined CHF and medication non compliance & myasthenia gravis COPD Former smoker (50 pack year history) PMHx: COPD, combined CHF, MG - Ventilator  settings: PRVC 8 mL/kg, 100% FiO2, PEEP increased to 10 due to desaturations - Wean PEEP and FiO2 for sats greater than 90% - Plateau pressures less than 30 cm H20 - VAP bundle in place - Intermittent chest x-ray & ABG - Daily WUA/ SBT as tolerated - Ensure adequate pulmonary hygiene  - budesonide nebs BID, Duo-neb Q 6 h, bronchodilators PRN - F/u respiratory 20 pathogen panel, COVID swab, trend PCT- start ABX PRN  Myasthenia Gravis query crisis - f/u thyroid panel, f/u acetylcholine receptor Ab and receptor labs - solu-medrol 40 mg BID - consult neurology to assist with myasthenia gravis management and medication regimen  Acute Kidney Injury in the setting of cardiac arrest Baseline Cr: 1.02, Cr on admission: 2.27 - Strict I/O's: alert provider if UOP < 0.5 mL/kg/hr - gentle IVF hydration  - Daily BMP, replace electrolytes PRN - Avoid nephrotoxic agents as able, ensure adequate renal perfusion  Type 2 Diabetes Mellitus Hemoglobin A1C: pending - Monitor CBG Q 4 hours - SSI resistant dosing - target range while in ICU: 140-180 - follow ICU hyper/hypo-glycemia protocol  Transaminitis in the setting of cardiac arrest - Trend hepatic function - Consider RUQ [place] if needed - avoid hepatotoxic agents  At risk for anoxic encephalopathy. 8 minutes downtime, remains minimally responsive, unclear how long and how significantly hypoxic he was - PAD protocol in place: midazolam & fentanyl IVP PRN - RASS goal: -1 - assess EEG - CT head once stabilized  Best Practice (right click and "Reselect all SmartList Selections" daily)  Diet/type: NPO w/ meds via tube DVT prophylaxis: SCD GI prophylaxis: PPI Lines: Central line, Arterial Line, and yes  and it is still needed Foley:  Yes, and it is still needed Code Status:  full code Last date of multidisciplinary goals of care discussion [04/30/22]  Labs   CBC: No results for input(s): WBC, NEUTROABS, HGB, HCT, MCV, PLT in the last 168  hours.  Basic Metabolic Panel: No results for input(s): NA, K, CL, CO2, GLUCOSE, BUN, CREATININE, CALCIUM, MG, PHOS in the last 168 hours. GFR: CrCl cannot be calculated (Patient's most recent lab result is older than the maximum 21 days allowed.). No results for input(s): PROCALCITON, WBC, LATICACIDVEN in the last 168 hours.  Liver Function Tests: No results for input(s): AST, ALT, ALKPHOS, BILITOT, PROT, ALBUMIN in the last 168 hours. No results for input(s): LIPASE, AMYLASE in the last 168 hours. No results for input(s): AMMONIA in the last 168 hours.  ABG    Component Value Date/Time   HCO3 62.8 (H) 06/10/2019 1940   O2SAT 55.9 06/10/2019 1940     Coagulation Profile: No results for input(s): INR, PROTIME in the last 168 hours.  Cardiac Enzymes: No results for input(s): CKTOTAL, CKMB, CKMBINDEX, TROPONINI in the last 168 hours.  HbA1C: Hemoglobin A1C  Date/Time Value Ref Range Status  12/21/2015 12:00 AM 6.3  Final    CBG: No results for input(s): GLUCAP in the last 168 hours.  Review of Systems:   Patient intubated and unresponsive, unable to participate in interview  Past Medical History:  He,  has a past medical history of Arthritis, COPD (chronic obstructive pulmonary disease) ([place]), Diabetes mellitus without complication ([place]), Dyspnea, Hyperlipidemia, Hypertension, Myasthenia gravis ([place]), Oxygen deficit, and Ventricular tachycardia ([place]).   Surgical History:   Past Surgical History:  Procedure Laterality Date   carpel tunnel sx     CATARACT EXTRACTION     right eye   CATARACT EXTRACTION W/PHACO Left 12/01/2020   Procedure: CATARACT EXTRACTION PHACO AND INTRAOCULAR LENS PLACEMENT (IOC) LEFT 10.83 01:14.1 14.6%;  Surgeon: Leandrew Koyanagi, MD;  Location: [facility];  Service: Ophthalmology;  Laterality: Left;   RETINAL DETACHMENT SURGERY     SKIN GRAFT     Behind left knee   TONSILLECTOMY AND ADENOIDECTOMY       Social History:   reports  that he quit smoking about 4 years ago. His smoking use included pipe and cigarettes. He has a 20.00 pack-year smoking history. He quit smokeless tobacco use about 4 years ago. He reports current alcohol use. He reports that he does not use drugs.   Family History:  His family history includes Arthritis in his father, mother, and another family member; Heart attack in his father; Heart disease (age of onset: 48) in his father; Hypertension in his mother; Kidney disease in his mother; Stroke in his father; Thyroid disease in his mother.   Allergies No Known Allergies   Home Medications  Prior to Admission medications   Medication Sig Start Date End Date Taking? Authorizing Provider  albuterol (PROVENTIL HFA;VENTOLIN HFA) 108 (90 Base) MCG/ACT inhaler Inhale into the lungs every 6 (six) hours as needed for wheezing or shortness of breath.    [provider]  apixaban (ELIQUIS) 5 MG TABS tablet Take 5 mg by mouth 2 (two) times daily.    [provider]  budesonide-formoterol (SYMBICORT) 160-4.5 MCG/ACT inhaler Inhale 2 puffs into the lungs 2 (two) times daily. 06/16/19   [name], MD  cholecalciferol (VITAMIN D) 25 MCG (1000 UNIT) tablet Take 2,000 Units by mouth daily.    [provider]  empagliflozin (JARDIANCE) 25 MG TABS tablet Take by mouth daily.    [provider]  famotidine (PEPCID) 20 MG tablet Take 20 mg by mouth as needed for heartburn or indigestion.    [provider]  flecainide (TAMBOCOR) 50 MG tablet Take 50 mg by mouth 2 (two) times daily.    [provider]  HYDROcodone-acetaminophen (NORCO/VICODIN) 5-325 MG tablet Take 1 tablet by mouth every 6 (six) hours as needed for moderate pain. 04/04/22   [name], MD  omeprazole (PRILOSEC) 40 MG capsule Take 40 mg by mouth 2 (two) times daily.     [provider]  pramipexole (MIRAPEX) 0.5 MG tablet Take 1 tablet (0.5 mg total) by mouth at bedtime. 06/04/21    [name], MD  pravastatin (PRAVACHOL) 20 MG tablet Take 20 mg by mouth daily.    [provider]  predniSONE (DELTASONE) 20 MG tablet Take 15 mg by mouth daily with breakfast.    [provider]  pyridostigmine (MESTINON) 60 MG tablet Take 30 mg by mouth 3 (three) times daily.    [provider]  terbinafine (LAMISIL) 250 MG tablet Take 250 mg by mouth daily. 06/03/21   [provider]  tiotropium (SPIRIVA) 18 MCG inhalation capsule Place 18 mcg into inhaler and inhale daily.    [provider]  torsemide (DEMADEX) 20 MG tablet Take 1 tablet (20 mg) by mouth 3 days a week 10/13/21   Deboraha Sprang, MD  vitamin B-12 (CYANOCOBALAMIN) 1000 MCG tablet Take 1,000 mcg by mouth daily.    [provider]     Critical care time: 65 minutes      Venetia Night, AGACNP-BC Acute Care Nurse Practitioner [facility]   [phone] / [phone] Please see Amion for pager details.

## 2022-04-30 NOTE — Progress Notes (Signed)
*  PRELIMINARY RESULTS* Echocardiogram 2D Echocardiogram has been performed. Definity IV ultrasound imaging agent used on this study.  Claretta Fraise 04/30/2022, 9:42 AM

## 2022-04-30 NOTE — ED Notes (Signed)
No pulse continuing CPR

## 2022-04-30 NOTE — Progress Notes (Signed)
Chaplain responded to page. Chaplain offered compassionate presence. Pt. Is unavailable and no family at bedside or ICU waiting room at this time. Please call Chaplain if needed.

## 2022-04-30 NOTE — ED Triage Notes (Signed)
76 y/o male arrived to the [facility] via EMS coming from home with a CC of chest pain and respiratory distress. Per EMS pt O2 SAT was in the 70%s and was 90% on Nonrebreather. 2.5 of albuterol was given. Pt has a history of COPD and ventricular tachycardia. Pt present cool and pale.

## 2022-04-30 NOTE — ED Notes (Addendum)
Pt was intubated at this time

## 2022-04-30 NOTE — ED Notes (Signed)
Report to [place], [place]

## 2022-04-30 NOTE — Progress Notes (Signed)
ANTICOAGULATION CONSULT NOTE  Pharmacy Consult for heparin infusion initiation and monitoring Indication: atrial fibrillation  No Known Allergies  Patient Measurements: Weight: 108 kg (238 lb 1.6 oz) Heparin Dosing Weight: 90 kg  Vital Signs: Temp: 100.8 F (38.2 C) (05/21 2100) Temp Source: Bladder (05/21 1200) BP: 93/73 (05/21 1749) Pulse Rate: 53 (05/21 2100)  Labs: Recent Labs    04/30/22 0332 04/30/22 0546 04/30/22 0550 04/30/22 1226 04/30/22 1734 04/30/22 1955 04/30/22 2109  HGB 16.6  --   --   --  16.3  --   --   HCT 57.7*  --   --   --  54.8*  --   --   PLT 189  --   --   --  146*  --   --   APTT  --   --   --  33  --   --  110*  LABPROT 23.0*  --   --   --   --   --  31.8*  INR 2.1*  --   --   --   --   --  3.1*  CREATININE  --   --  2.27*  --  3.18*  --   --   TROPONINIHS 373* 1,137*  --   --  2,844* 2,210*  --     Estimated Creatinine Clearance: 23.5 mL/min (A) (by C-G formula based on SCr of 3.18 mg/dL (H)).   Medical History: Past Medical History:  Diagnosis Date   Arthritis    COPD (chronic obstructive pulmonary disease) ([place])    Diabetes mellitus without complication (HCC)    diet controlled   Dyspnea    with activity   Hyperlipidemia    Hypertension    Myasthenia gravis (HCC)    Oxygen deficit    Ventricular tachycardia (HCC)     Medications:  Scheduled:   budesonide (PULMICORT) nebulizer solution  0.25 mg Nebulization BID   Chlorhexidine Gluconate Cloth  6 each Topical Daily   docusate  100 mg Per Tube BID   flecainide  50 mg Per Tube Q12H   insulin aspart  0-20 Units Subcutaneous Q4H   ipratropium-albuterol  3 mL Nebulization Q6H   methylPREDNISolone (SOLU-MEDROL) injection  40 mg Intravenous Q12H   pantoprazole sodium  40 mg Per Tube Daily   polyethylene glycol  17 g Per Tube Daily    Assessment: 76 yo M w/ PMH of atrial fibrillation, COPD, CHF, DM, HLD, HTN presenting to [facility] ED via EMS from home on 04/30/22 in respiratory  distress. Prior to admission he was receiving apixaban with last dose unknown. Baseline INR 2.1, H&H/PLT wnl, LFTs elevated  Goal of Therapy:  Heparin level 0.3-0.7 units/ml aPTT 66 - 102 seconds Monitor platelets by anticoagulation protocol: Yes   Results Date/time HL/aPTT Comments 0521'@2109'$  aPTT 110 Supratherapeutic @ 1300un/hr  Plan:  Decrease heparin infusion to 1200 units/hr Check aPTT level in 8 hours and daily while on heparin until aPTT and heparin level correlate Continue to monitor H&H and platelets  Nils Thor Rodriguez-Guzman PharmD, BCPS 04/30/2022 9:54 PM

## 2022-04-30 NOTE — Procedures (Signed)
Central Venous Catheter Insertion Procedure Note  NOVAK STGERMAINE  [id number]  12/28/1945  Date:04/30/22  Time:5:30 AM   Provider Performing:[place] Espina L Rust-Chester   Procedure: Insertion of Non-tunneled Central Venous Catheter(36556) with US guidance (35456)   Indication(s) Medication administration  Consent Unable to obtain consent due to emergent nature of procedure.  Anesthesia Versed 2 mg, fentanyl IVP  Timeout Verified patient identification, verified procedure, site/side was marked, verified correct patient position, special equipment/implants available, medications/allergies/relevant history reviewed, required imaging and test results available.  Sterile Technique Maximal sterile technique including full sterile barrier drape, hand hygiene, sterile gown, sterile gloves, mask, hair covering, sterile ultrasound probe cover (if used).  Procedure Description Area of catheter insertion was cleaned with chlorhexidine and draped in sterile fashion.  With real-time ultrasound guidance a central venous catheter was placed into the right femoral vein. Nonpulsatile blood flow and easy flushing noted in all ports.  The catheter was sutured in place and sterile dressing applied.  Complications/Tolerance None; patient tolerated the procedure well. Chest X-ray is ordered to verify placement for internal jugular or subclavian cannulation.   Chest x-ray is not ordered for femoral cannulation.  EBL Minimal  Specimen(s) None  Venetia Night, AGACNP-BC Acute Care Nurse Practitioner [facility]   [phone] / [phone] Please see Amion for pager details.

## 2022-04-30 NOTE — Consult Note (Signed)
PHARMACY CONSULT NOTE - FOLLOW UP  Pharmacy Consult for Electrolyte Monitoring and Replacement   Recent Labs: Potassium (mmol/L)  Date Value  04/30/2022 4.4   Magnesium (mg/dL)  Date Value  04/30/2022 3.0 (H)   Calcium (mg/dL)  Date Value  04/30/2022 7.7 (L)   Albumin (g/dL)  Date Value  04/30/2022 3.3 (L)  04/03/2022 4.1   Phosphorus (mg/dL)  Date Value  04/30/2022 6.6 (H)   Sodium (mmol/L)  Date Value  04/30/2022 139  04/03/2022 149 (H)     Assessment: 76 yo M presenting to [facility] ED via EMS from home on 04/30/22 in respiratory distress.   Goal of Therapy:  WNL  Plan:  No replacement needed at this time.  F/u with AM labs.   Oswald Hillock ,PharmD Clinical Pharmacist 04/30/2022 7:26 AM

## 2022-04-30 NOTE — ED Notes (Signed)
[employer] notified of [place] [phone]

## 2022-04-30 NOTE — Consult Note (Signed)
Neurology Consultation Reason for Consult: Cardiac Arrest Referring Physician: Windy Canny  CC: Unresponsiveness  History is obtained from: Family  HPI: Joseph Macdonald is a 76 y.o. male with a history of myasthenia gravis who has been feeling somewhat ill over the past few days.  When he would eat, he would feel like he was regurgitating his food and had some abdominal pain.  He presented to the emergency department last night with chest pain, shortness of breath, abdominal pain.  When he arrived he was satting in the 50s, and only increased to 90% on nonrebreather.  He very quickly went into cardiac arrest with 8 minutes of CPR.  He was intubated and ROSC was obtained.  Due to lack of him waking up despite sedation being weaned, neurology has been consulted for assistance with prognosis as well as assistance with myasthenia management.   ROS: Unable to obtain due to altered mental status.   Past Medical History:  Diagnosis Date   Arthritis    COPD (chronic obstructive pulmonary disease) ([place])    Diabetes mellitus without complication (HCC)    diet controlled   Dyspnea    with activity   Hyperlipidemia    Hypertension    Myasthenia gravis ([place])    Oxygen deficit    Ventricular tachycardia ([place])      Family History  Problem Relation Age of Onset   Hypertension Mother    Thyroid disease Mother    Kidney disease Mother    Arthritis Mother    Stroke Father    Heart disease Father 62   Arthritis Father    Heart attack Father    Arthritis Other      Social History:  reports that he quit smoking about 4 years ago. His smoking use included pipe and cigarettes. He has a 20.00 pack-year smoking history. He quit smokeless tobacco use about 4 years ago. He reports current alcohol use. He reports that he does not use drugs.   Exam: Current vital signs: BP (!) 76/66   Pulse 62   Temp (!) 100.8 F (38.2 C)   Resp (!) 24   SpO2 94%  Vital signs in last 24 hours: Temp:   [98.4 F (36.9 C)-100.8 F (38.2 C)] 100.8 F (38.2 C) (05/21 1245) Pulse Rate:  [0-87] 62 (05/21 1245) Resp:  [16-35] 24 (05/21 1245) BP: (64-236)/(53-145) 76/66 (05/21 1200) SpO2:  [65 %-98 %] 94 % (05/21 1245) Arterial Line BP: (84-205)/(54-112) 94/59 (05/21 1245) FiO2 (%):  [100 %] 100 % (05/21 1000)   Physical Exam  General: In bed, intubated  Neuro: Mental Status: Patient opens eyes to noxious stimulation, but does not fixate or engage with examiner.  He does not follow commands. Cranial Nerves: II: He does not blink to threat. Pupils are equal, round, and reactive to light.   V: VII: Corneals are intact X: Cough present  Motor: He has minimal flexion versus withdrawal in all four extremities  \sensory: As above  Cerebellar: Does not perform     I have reviewed labs in epic and the results pertinent to this consultation are: Creatinine 2.27  I have reviewed the images obtained: CT head is negative  Impression: 76 year old male with persistent encephalopathy after cardiac arrest today at 2 AM.  Given that we are only 12 hours out from his arrest, his brainstem is intact, I do not think I can currently comment on prognosis.  I would favor repeating a head CT tomorrow getting an EEG at that  time as well.  No evidence of seizure activity.  As for his myasthenia, myasthenic exacerbations typically do not cause hypoxic respiratory failure but rather hypercarbic respiratory failure.  Though it could be one contributing factor, I would not attribute his overall presentation solely to this.  If he improves neurologically, and appears to have some neuromuscular weakness, could consider treatment at that time, but I would favor holding off until it becomes clear that it is necessary, especially considering the volume needed and prothrombotic character of IVIG.  Recommendations: 1) CT head in the morning 2) EEG 3) continue clinical observation   Roland Rack, MD [facility] [phone]  If 7pm- 7am, please page neurology on call as listed in [place].

## 2022-04-30 NOTE — ED Notes (Signed)
No Pulse continuing CPR

## 2022-04-30 NOTE — Consult Note (Addendum)
Cardiology Consultation:   Patient ID: Joseph Macdonald MRN: [id number]; DOB: 1946/05/09  Admit date: 04/30/2022 Date of Consult: 04/30/2022  PCP:  [name], MD   [facility] Providers Cardiologist:  [name], MD        Patient Profile:   Joseph Macdonald is a 76 y.o. male with a hx of VT, paroxysmal atrial fibrillation, HFpEF who is being seen 04/30/2022 for the evaluation of cardiac arrest at the request of Dr. Patsey Berthold.  History of Present Illness:   Joseph Macdonald is a 76 year old gentleman with history of HFpEF, hypertension, VT, paroxysmal A-fib, hyperlipidemia, COPD, myasthenia gravis who presents to the hospital due to worsening shortness of breath.  Patient was at home when he developed acute respiratory distress/shortness of breath.  Per family, symptoms have been worsening over 2-day.  Was brought to the emergency room due to worsening symptoms.  Upon EMS arrival at home, he was satting in the 83s to 58s.  Brought to the ED per EMS.  While in the ED upon arrival, patient lost pulse, initial rhythm was not ascertained as patient was not on telemetry.  CPR was commenced for 8 minutes with achievement of ROSC.  Patient emergently intubated, no defibrillation was performed.  Initial EKG was sinus rhythm with incomplete left bundle branch block.  Troponin was 373.  Patient admitted to ICU, started on pressors for BP control.  Not on sedation.   Past Medical History:  Diagnosis Date   Arthritis    COPD (chronic obstructive pulmonary disease) ([place])    Diabetes mellitus without complication (HCC)    diet controlled   Dyspnea    with activity   Hyperlipidemia    Hypertension    Myasthenia gravis ([place])    Oxygen deficit    Ventricular tachycardia [facility])     Past Surgical History:  Procedure Laterality Date   carpel tunnel sx     CATARACT EXTRACTION     right eye   CATARACT EXTRACTION W/PHACO Left 12/01/2020   Procedure: CATARACT EXTRACTION PHACO AND  INTRAOCULAR LENS PLACEMENT (IOC) LEFT 10.83 01:14.1 14.6%;  Surgeon: Leandrew Koyanagi, MD;  Location: [place];  Service: Ophthalmology;  Laterality: Left;   RETINAL DETACHMENT SURGERY     SKIN GRAFT     Behind left knee   TONSILLECTOMY AND ADENOIDECTOMY       Home Medications:  Prior to Admission medications   Medication Sig Start Date End Date Taking? Authorizing Provider  apixaban (ELIQUIS) 5 MG TABS tablet Take 5 mg by mouth 2 (two) times daily.   Yes [provider]  budesonide-formoterol (SYMBICORT) 160-4.5 MCG/ACT inhaler Inhale 2 puffs into the lungs 2 (two) times daily. 06/16/19  Yes [name], MD  cholecalciferol (VITAMIN D) 25 MCG (1000 UNIT) tablet Take 2,000 Units by mouth daily.   Yes [provider]  empagliflozin (JARDIANCE) 25 MG TABS tablet Take 25 mg by mouth daily.   Yes [provider]  famotidine (PEPCID) 20 MG tablet Take 20 mg by mouth as needed for heartburn or indigestion.   Yes [provider]  flecainide (TAMBOCOR) 50 MG tablet Take 50 mg by mouth 2 (two) times daily.   Yes [provider]  HYDROcodone-acetaminophen (NORCO/VICODIN) 5-325 MG tablet Take 1 tablet by mouth every 6 (six) hours as needed for moderate pain. 04/04/22  Yes [name], MD  omeprazole (PRILOSEC) 40 MG capsule Take 40 mg by mouth 2 (two) times daily.    Yes [provider]  pramipexole (MIRAPEX) 0.5 MG tablet Take 1 tablet (0.5 mg total) by mouth at bedtime. 06/04/21  Yes [name], MD  pravastatin (PRAVACHOL) 20 MG tablet Take 20 mg by mouth daily.   Yes [provider]  predniSONE (DELTASONE) 20 MG tablet Take 15 mg by mouth daily with breakfast.   Yes [provider]  pyridostigmine (MESTINON) 60 MG tablet Take 30 mg by mouth 3 (three) times daily.   Yes [provider]  terbinafine (LAMISIL) 250 MG tablet Take 250 mg by mouth daily. 06/03/21  Yes [provider]   tiotropium (SPIRIVA) 18 MCG inhalation capsule Place 18 mcg into inhaler and inhale daily.   Yes [provider]  vitamin B-12 (CYANOCOBALAMIN) 1000 MCG tablet Take 1,000 mcg by mouth daily.   Yes [provider]  albuterol (PROVENTIL HFA;VENTOLIN HFA) 108 (90 Base) MCG/ACT inhaler Inhale into the lungs every 6 (six) hours as needed for wheezing or shortness of breath.    [provider]  torsemide (DEMADEX) 20 MG tablet Take 1 tablet (20 mg) by mouth 3 days a week 10/13/21   Deboraha Sprang, MD    Inpatient Medications: Scheduled Meds:  budesonide (PULMICORT) nebulizer solution  0.25 mg Nebulization BID   docusate  100 mg Per Tube BID   flecainide  50 mg Per Tube Q12H   heparin  4,000 Units Intravenous Once   insulin aspart  0-20 Units Subcutaneous Q4H   ipratropium-albuterol  3 mL Nebulization Q6H   methylPREDNISolone (SOLU-MEDROL) injection  40 mg Intravenous Q12H   pantoprazole sodium  40 mg Per Tube Daily   polyethylene glycol  17 g Per Tube Daily   Continuous Infusions:  acetaminophen 1,000 mg (04/30/22 1141)   heparin     nitroGLYCERIN     norepinephrine (LEVOPHED) Adult infusion 3 mcg/min (04/30/22 1104)   vasopressin 0.03 Units/min (04/30/22 0900)   PRN Meds: acetaminophen, docusate, fentaNYL (SUBLIMAZE) injection, fentaNYL (SUBLIMAZE) injection, midazolam, midazolam, polyethylene glycol  Allergies:   No Known Allergies  Social History:   Social History   Socioeconomic History   Marital status: Married    Spouse name: Not on file   Number of children: 1   Years of education: H/S   Highest education level: High school graduate  Occupational History   Occupation: Retired Building surveyor  Tobacco Use   Smoking status: Former    Packs/day: 0.50    Years: 40.00    Pack years: 20.00    Types: Pipe, Cigarettes    Quit date: 08/19/2017    Years since quitting: 4.6   Smokeless tobacco: Former    Quit date: 08/19/2017   Tobacco  comments:    previously smoked 1 1/2 pack since his 76 years old  Quit 2018  Vaping Use   Vaping Use: Former   Substances: Nicotine, Flavoring  Substance and Sexual Activity   Alcohol use: Yes    Comment: occasionally- monthly or less   Drug use: No   Sexual activity: Not on file  Other Topics Concern   Not on file  Social History Narrative   2 adopted children   Social Determinants of Health   Financial Resource Strain: Low Risk    Difficulty of Paying Living Expenses: Not hard at all  Food Insecurity: No Food Insecurity   Worried About Charity fundraiser in the Last Year: Never true   Ran Out of Food in the Last Year: Never true  Transportation Needs: No  Transportation Needs   Lack of Transportation (Medical): No   Lack of Transportation (Non-Medical): No  Physical Activity: Inactive   Days of Exercise per Week: 0 days   Minutes of Exercise per Session: 0 min  Stress: No Stress Concern Present   Feeling of Stress : Not at all  Social Connections: Moderately Isolated   Frequency of Communication with Friends and Family: Three times a week   Frequency of Social Gatherings with Friends and Family: Once a week   Attends Religious Services: Never   Marine scientist or Organizations: No   Attends Music therapist: Never   Marital Status: Married  Human resources officer Violence: Not At Risk   Fear of Current or Ex-Partner: No   Emotionally Abused: No   Physically Abused: No   Sexually Abused: No    Family History:    Family History  Problem Relation Age of Onset   Hypertension Mother    Thyroid disease Mother    Kidney disease Mother    Arthritis Mother    Stroke Father    Heart disease Father 14   Arthritis Father    Heart attack Father    Arthritis Other      ROS:  Please see the history of present illness.   All other ROS reviewed and negative.     Physical Exam/Data:   Vitals:   04/30/22 1030 04/30/22 1045 04/30/22 1100 04/30/22 1115   BP:      Pulse: 72 65 66 64  Resp: (!) 24 (!) 24 (!) 24 (!) 24  Temp: (!) 100.6 F (38.1 C) (!) 100.8 F (38.2 C) (!) 100.8 F (38.2 C) (!) 100.8 F (38.2 C)  TempSrc:      SpO2: 98% 92% 93% 92%    Intake/Output Summary (Last 24 hours) at 04/30/2022 1242 Last data filed at 04/30/2022 1000 Gross per 24 hour  Intake 248.16 ml  Output 50 ml  Net 198.16 ml      04/03/2022   11:33 AM 12/01/2021    9:34 AM 10/13/2021   10:51 AM  Last 3 Weights  Weight (lbs) 231 lb 6 oz 236 lb 1.6 oz 227 lb  Weight (kg) 104.951 kg 107.094 kg 102.967 kg     There is no height or weight on file to calculate BMI.  General: Intubated, somnolent HEENT: normal Neck: no JVD Vascular: No carotid bruits; Distal pulses 2+ bilaterally Cardiac:  normal S1, S2; RRR;  Lungs: Vented breath sounds Abd: soft, nontender, no hepatomegaly  Ext: 2+ pitting edema Musculoskeletal:  No deformities,  Skin: warm and dry  Neuro: Unable to assess Psych: Intubated, unable to assess  EKG:  The EKG was personally reviewed and demonstrates: Sinus rhythm, incomplete right bundle Telemetry:  Telemetry was personally reviewed and demonstrates: Sinus rhythm  Relevant CV Studies: Echo EF 35 to 40%  Laboratory Data:  High Sensitivity Troponin:   Recent Labs  Lab 04/30/22 0332 04/30/22 0546  TROPONINIHS 373* 1,137*     Chemistry Recent Labs  Lab 04/30/22 0333 04/30/22 0550  NA  --  139  K  --  4.4  CL  --  104  CO2  --  25  GLUCOSE  --  214*  BUN  --  37*  CREATININE  --  2.27*  CALCIUM  --  7.7*  MG 3.0*  --   GFRNONAA  --  29*  ANIONGAP  --  10    Recent Labs  Lab 04/30/22 0550  PROT 6.0*  ALBUMIN 3.3*  AST 702*  ALT 657*  ALKPHOS 40  BILITOT 1.4*   Lipids No results for input(s): CHOL, TRIG, HDL, LABVLDL, LDLCALC, CHOLHDL in the last 168 hours.  Hematology Recent Labs  Lab 04/30/22 0332  WBC 12.4*  RBC 5.53  HGB 16.6  HCT 57.7*  MCV 104.3*  MCH 30.0  MCHC 28.8*  RDW 12.9  PLT 189    Thyroid  Recent Labs  Lab 04/30/22 0546  TSH 0.441  FREET4 0.88    BNP Recent Labs  Lab 04/30/22 0332  BNP 971.6*    DDimer No results for input(s): DDIMER in the last 168 hours.   Radiology/Studies:  DG Abd 1 View  Result Date: 04/30/2022 CLINICAL DATA:  Repositioning of enteric tube EXAM: ABDOMEN - 1 VIEW COMPARISON:  Study done earlier today FINDINGS: There is interval repositioning of enteric tube with its tip in the region of fundus/body of the stomach. Increased density in the left lower lung fields may suggest infiltrate and pleural effusion. Bowel gas pattern in the upper abdomen is unremarkable. IMPRESSION: Tip enteric tube is more caudal in position in the area of fundus/body of the stomach. Electronically Signed   By: Elmer Picker M.D.   On: 04/30/2022 10:50   DG Abd 1 View  Result Date: 04/30/2022 CLINICAL DATA:  76 year old male status post nasogastric tube placement. EXAM: ABDOMEN - 1 VIEW COMPARISON:  No priors. FINDINGS: Tip of nasogastric tube is in the stomach. Side port is at or immediately proximal to the gastroesophageal junction. Visualized bowel gas pattern is nonobstructive. Transcutaneous defibrillator pad projects over the epigastric region. IMPRESSION: 1. Nasogastric tube is in a slightly high position and could be advanced approximately 10 cm for more optimal placement. Electronically Signed   By: Vinnie Langton M.D.   On: 04/30/2022 07:15   CT HEAD WO CONTRAST (5MM)  Result Date: 04/30/2022 CLINICAL DATA:  Mental status change. EXAM: CT HEAD WITHOUT CONTRAST TECHNIQUE: Contiguous axial images were obtained from the base of the skull through the vertex without intravenous contrast. RADIATION DOSE REDUCTION: This exam was performed according to the departmental dose-optimization program which includes automated exposure control, adjustment of the mA and/or kV according to patient size and/or use of iterative reconstruction technique. COMPARISON:   November 29, 2019 FINDINGS: Brain: No subdural, epidural, or subarachnoid hemorrhage. Cerebellum, brainstem, and basal cisterns are normal. Scattered white matter changes again identified. No acute cortical ischemia or infarct. No mass effect or midline shift. Vascular: No hyperdense vessel or unexpected calcification. Skull: Normal. Negative for fracture or focal lesion. Sinuses/Orbits: No acute finding. Other: None. IMPRESSION: Chronic white matter changes.  No acute intracranial abnormalities. Electronically Signed   By: Dorise Bullion III M.D.   On: 04/30/2022 09:44   CT CHEST WO CONTRAST  Result Date: 04/30/2022 CLINICAL DATA:  Difficulty breathing EXAM: CT CHEST WITHOUT CONTRAST TECHNIQUE: Multidetector CT imaging of the chest was performed following the standard protocol without IV contrast. RADIATION DOSE REDUCTION: This exam was performed according to the departmental dose-optimization program which includes automated exposure control, adjustment of the mA and/or kV according to patient size and/or use of iterative reconstruction technique. COMPARISON:  Previous studies including the chest radiograph done earlier today FINDINGS: Cardiovascular: Coronary artery calcifications are seen. Heart is enlarged in size. There is ectasia of ascending thoracic aorta measuring 4 cm. There is ectasia of main pulmonary artery measuring 3.7 cm suggesting pulmonary arterial hypertension. Mediastinum/Nodes: No significant lymphadenopathy seen.There are small pockets of  air in the some of the venous structures, possibly introduced during venipuncture. Tip of endotracheal tube is at the level of aortic arch. Enteric tube is noted traversing the esophagus with its tip in the stomach. Lungs/Pleura: There are small patchy infiltrates in the right upper lobe, right middle lobe and left upper lobe. Dense infiltrates are seen in the both lower lobes with air bronchogram. Small right pleural effusion is seen. There is no  pneumothorax. Upper Abdomen: There is subtle increased density in gallbladder lumen. Gallbladder is not distended. Musculoskeletal: Unremarkable. IMPRESSION: There scattered small patchy infiltrates in both lungs, more so in the right upper lobe suggesting multifocal pneumonia. Dense infiltrates with air bronchogram are noted in both lower lobes suggesting atelectasis/pneumonia. Cardiomegaly. Coronary artery calcifications are seen. There is ectasia of main pulmonary artery suggesting pulmonary arterial hypertension. There is ectasia of ascending thoracic aorta measuring 4 cm. Possible gallbladder stones. Electronically Signed   By: Elmer Picker M.D.   On: 04/30/2022 09:53   DG Chest Portable 1 View  Result Date: 04/30/2022 CLINICAL DATA:  Chest pain, cardiopulmonary arrest EXAM: PORTABLE CHEST 1 VIEW COMPARISON:  None Available. FINDINGS: Endotracheal tube is seen 4.9 cm above the carina. Pulmonary insufflation is normal and symmetric. Mild interstitial pulmonary edema is present. No confluent pulmonary infiltrate. No pneumothorax or pleural effusion. Cardiac size is within normal limits when accounting for supine positioning and sub maximal pulmonary insufflation. IMPRESSION: Endotracheal tube in appropriate position. Mild interstitial pulmonary edema. Electronically Signed   By: Fidela Salisbury M.D.   On: 04/30/2022 03:34     Assessment and Plan:   Cardiac arrest -Etiology likely due to pulmonary/respiratory failure. -Echo with moderately reduced EF 35 to 40%. -Continue supportive care with pressors, intubation as per ICU team -Titrate pressors as BP permits -Monitor neurologic function.  2.  Moderately reduced EF 35 to 40% -GDMT contraindicated at this point.  Low blood pressures requiring pressors -Recommend starting IV Lasix as BP permits.  Pt is Volume overload -consider R/LHC when clinical picture permits.   3.  Paroxysmal A-fib,hx of VT -maintaining sinus rhythm -Restart PTA  flecainide -Continue heparin drip for now.  4.  Respiratory failure, COPD -Intubated, weaning trials as per ICU team  Total encounter time more than 85 minutes  Greater than 50% was spent in counseling and coordination of care with the patient   Signed, Kate Sable, MD  04/30/2022 12:42 PM

## 2022-04-30 NOTE — ED Notes (Signed)
Carotid pulse was obtained at this time with sinus brady on the monitor

## 2022-04-30 NOTE — Procedures (Signed)
Arterial Catheter Insertion Procedure Note  Joseph Macdonald  [id number]  03-Sep-1946  Date:04/30/22  Time:5:31 AM    Provider Performing: Huel Cote [place]    Procedure: Insertion of Arterial Line [phone]) with US guidance ([id number])   Indication(s) Blood pressure monitoring and/or need for frequent ABGs  Consent Unable to obtain consent due to emergent nature of procedure.  Anesthesia Versed and fentanyl IVP   Time Out Verified patient identification, verified procedure, site/side was marked, verified correct patient position, special equipment/implants available, medications/allergies/relevant history reviewed, required imaging and test results available.   Sterile Technique Maximal sterile technique including full sterile barrier drape, hand hygiene, sterile gown, sterile gloves, mask, hair covering, sterile ultrasound probe cover (if used).   Procedure Description Area of catheter insertion was cleaned with chlorhexidine and draped in sterile fashion. With real-time ultrasound guidance an arterial catheter was placed into the right femoral artery.  Appropriate arterial tracings confirmed on monitor.     Complications/Tolerance None; patient tolerated the procedure well.   EBL Minimal   Specimen(s) None  Venetia Night, AGACNP-BC Acute Care Nurse Practitioner [facility]   [phone] / [phone] Please see Amion for pager details.

## 2022-04-30 NOTE — Progress Notes (Signed)
Pharmacy Antibiotic Note  Joseph Macdonald is a 76 y.o. male w/ PMH of atrial fibrillation, myasthenia gravis, COPD, CHF, DM, HLD, HTN admitted on 04/30/2022 with pneumonia and cardiac arrest today at 2 AM with emergent intubation requiring mechanical ventilatory support.  Pharmacy has been consulted for Unasyn dosing.   Plan: start Unasyn 3 grams IV every 6 hours Follow renal function for needed dose adjuestments  Weight: 108 kg (238 lb 1.6 oz)  Temp (24hrs), Avg:100.5 F (38.1 C), Min:98.4 F (36.9 C), Max:100.9 F (38.3 C)  Recent Labs  Lab 04/30/22 0332 04/30/22 0550 04/30/22 0603 04/30/22 1149  WBC 12.4*  --   --   --   CREATININE  --  2.27*  --   --   LATICACIDVEN  --   --  2.7* 2.2*    Estimated Creatinine Clearance: 33 mL/min (A) (by C-G formula based on SCr of 2.27 mg/dL (H)).    No Known Allergies  Antimicrobials this admission: 05/21 Unasyn >>   Microbiology results: 05/21 Sputum: pending 05/21 Resp panel: negative  05/21 MRSA PCR: negative  Thank you for allowing pharmacy to be a part of this patient's care.  Dallie Piles 04/30/2022 2:11 PM

## 2022-04-30 NOTE — ED Provider Notes (Signed)
[facility] Provider Note    Event Date/Time   First MD Initiated Contact with Patient 04/30/22 0350     (approximate)   History   Chest Pain and Shortness of Breath   HPI  Joseph Macdonald is a 76 y.o. male with a history of myasthenia gravis, heart failure with a EF of 45%, COPD on oxygen, hypertension, smoking, hyperlipidemia, A-fib on Eliquis who presents in respiratory distress.  According to EMS patient has had progressively worsening shortness of breath, orthopnea, and bilateral leg swelling for the last few days.  Has been noncompliant with some of his cardiac medications including his diuretic.  Upon arrival to the emergency room patient was in severe respiratory distress, hypoxic with sats in the 50s, cyanotic, wheezing and crackles throughout with elevated JVD and 3+ pitting edema bilaterally.  Patient immediately went into respiratory arrest leading to cardiac arrest.  8 minutes of CPR was performed with 3 rounds of epinephrine.  As soon as patient was intubated ROSC was achieved.  Unfortunately patient arrested before the ZOLL was in the room therefore unknown initial rhythm.  Patient did not receive any shocks.      Past Medical History:  Diagnosis Date   Arthritis    COPD (chronic obstructive pulmonary disease) ([place])    Diabetes mellitus without complication (HCC)    diet controlled   Dyspnea    with activity   Hyperlipidemia    Hypertension    Myasthenia gravis ([place])    Oxygen deficit    Ventricular tachycardia [facility])     Past Surgical History:  Procedure Laterality Date   carpel tunnel sx     CATARACT EXTRACTION     right eye   CATARACT EXTRACTION W/PHACO Left 12/01/2020   Procedure: CATARACT EXTRACTION PHACO AND INTRAOCULAR LENS PLACEMENT (IOC) LEFT 10.83 01:14.1 14.6%;  Surgeon: Leandrew Koyanagi, MD;  Location: [facility];  Service: Ophthalmology;  Laterality: Left;   RETINAL DETACHMENT SURGERY     SKIN GRAFT      Behind left knee   TONSILLECTOMY AND ADENOIDECTOMY       Physical Exam   Triage Vital Signs: ED Triage Vitals  Enc Vitals Group     BP 04/30/22 0313 (!) 236/145     Pulse Rate 04/30/22 0300 83     Resp 04/30/22 0300 (!) 35     Temp 04/30/22 0342 98.4 F (36.9 C)     Temp Source 04/30/22 0342 Bladder     SpO2 04/30/22 0259 (!) 70 %     Weight --      Height --      Head Circumference --      Peak Flow --      Pain Score --      Pain Loc --      Pain Edu? --      Excl. in [place]? --     Most recent vital signs: Vitals:   04/30/22 0345 04/30/22 0352  BP: (!) 85/57 105/84  Pulse: 60 (!) 56  Resp: 20 20  Temp:    SpO2: 94% 95%     Constitutional: Altered, cyanotic, severe respiratory distress. HEENT:      Head: Normocephalic and atraumatic.         Eyes: Conjunctivae are normal. Sclera is non-icteric.       Mouth/Throat: Mucous membranes are moist.       Neck: Supple with no signs of meningismus. Cardiovascular: Regular rate and rhythm. No murmurs, gallops,  or rubs. 2+ symmetrical distal pulses are present in all extremities.  Elevated JVD Respiratory: Severe respiratory distress, cyanotic, sats in the 50s, diffuse wheezing and crackles bilateral Gastrointestinal: Soft, non tender, and non distended with positive bowel sounds. No rebound or guarding. Genitourinary: No CVA tenderness. Musculoskeletal: 3+ pitting edema Neurologic: Face is symmetric. Moving all extremities. No gross focal neurologic deficits are appreciated. Skin: Skin is warm, dry and intact. No rash noted.  ED Results / Procedures / Treatments   Labs (all labs ordered are listed, but only abnormal results are displayed) Labs Reviewed  CBC WITH DIFFERENTIAL/PLATELET - Abnormal; Notable for the following components:      Result Value   WBC 12.4 (*)    HCT 57.7 (*)    MCV 104.3 (*)    MCHC 28.8 (*)    nRBC 0.4 (*)    Neutro Abs 9.4 (*)    Abs Immature Granulocytes 0.25 (*)    All other components  within normal limits  RESP PANEL BY RT-PCR (FLU A&B, COVID) ARPGX2  COMPREHENSIVE METABOLIC PANEL  BRAIN NATRIURETIC PEPTIDE  PROTIME-INR  BLOOD GAS, ARTERIAL  MAGNESIUM  PHOSPHORUS  TYPE AND SCREEN  TROPONIN I (HIGH SENSITIVITY)     EKG  ED ECG REPORT I, Rudene Re, the attending physician, personally viewed and interpreted this ECG.  Sinus rhythm with a rate of 73, left bundle branch block without concordant ST elevations  RADIOLOGY I, Rudene Re, attending MD, have personally viewed and interpreted the images obtained during this visit as below:  Chest x-ray showing pulmonary edema and cardiomegaly   ___________________________________________________ Interpretation by Radiologist:  DG Chest Portable 1 View  Result Date: 04/30/2022 CLINICAL DATA:  Chest pain, cardiopulmonary arrest EXAM: PORTABLE CHEST 1 VIEW COMPARISON:  None Available. FINDINGS: Endotracheal tube is seen 4.9 cm above the carina. Pulmonary insufflation is normal and symmetric. Mild interstitial pulmonary edema is present. No confluent pulmonary infiltrate. No pneumothorax or pleural effusion. Cardiac size is within normal limits when accounting for supine positioning and sub maximal pulmonary insufflation. IMPRESSION: Endotracheal tube in appropriate position. Mild interstitial pulmonary edema. Electronically Signed   By: Fidela Salisbury M.D.   On: 04/30/2022 03:34       PROCEDURES:  Critical Care performed: Yes, see critical care procedure note(s)  .Critical Care Performed by: Rudene Re, MD Authorized by: Rudene Re, MD   Critical care provider statement:    Critical care time (minutes):  45   Critical care time was exclusive of:  Separately billable procedures and treating other patients   Critical care was necessary to treat or prevent imminent or life-threatening deterioration of the following conditions:  Cardiac failure, respiratory failure, circulatory failure and  CNS failure or compromise   Critical care was time spent personally by me on the following activities:  Development of treatment plan with patient or surrogate, discussions with consultants, evaluation of patient's response to treatment, examination of patient, ordering and review of laboratory studies, ordering and review of radiographic studies, ordering and performing treatments and interventions, pulse oximetry, re-evaluation of patient's condition, review of old charts and ventilator management   I assumed direction of critical care for this patient from another provider in my specialty: no     Care discussed with: admitting provider      IMPRESSION / MDM / [place] / ED COURSE  I reviewed the triage vital signs and the nursing notes.  76 y.o. male with a history of myasthenia gravis, heart failure with a EF  of 45%, COPD on oxygen, hypertension, smoking, hyperlipidemia, A-fib on Eliquis who presents in respiratory distress.   Upon arrival to the emergency room patient was in severe respiratory distress, hypoxic with sats in the 50s, cyanotic, wheezing and crackles throughout with elevated JVD and 3+ pitting edema bilaterally.  Patient immediately went into respiratory arrest leading to cardiac arrest.  8 minutes of CPR was performed with 3 rounds of epinephrine.  As soon as patient was intubated ROSC was achieved.  No paralytics or sedation used for intubation unfortunately patient arrested before the ZOLL was in the room therefore unknown initial rhythm.  Patient did not receive any shocks.  After ROSC EKG showing left bundle branch block with no concordant ST elevations.  STEMI doctor, Dr. Fletcher Anon, was consulted immediately. Case was discussed with him and EKG was evaluated. He agreed that patient did not meet STEMI criteria and recommended medical stabilization with no emergent cath at this time. Lasix '80mg'$  given. After intubation patient's blood pressure 240/136.  Patient was started on  propofol and became hypotensive.  Propofol was discontinued and patient was started on norepinephrine for hypotension with systolics in the 18H. Patient's son was updated on patient's critical condition. ICU was consulted and after discussion with ICU NP patient was accepted to their service.   MEDICATIONS GIVEN IN ED: Medications  nitroGLYCERIN 0.2 mg/mL in dextrose 5 % infusion (has no administration in time range)  norepinephrine (LEVOPHED) '4mg'$  in 273m (0.016 mg/mL) premix infusion (20 mcg/min Intravenous Rate/Dose Change 04/30/22 0339)  docusate (COLACE) 50 MG/5ML liquid 100 mg (has no administration in time range)  polyethylene glycol (MIRALAX / GLYCOLAX) packet 17 g (has no administration in time range)  docusate (COLACE) 50 MG/5ML liquid 100 mg (has no administration in time range)  polyethylene glycol (MIRALAX / GLYCOLAX) packet 17 g (has no administration in time range)  heparin injection 5,000 Units (has no administration in time range)  pantoprazole sodium (PROTONIX) 40 mg/20 mL oral suspension 40 mg (has no administration in time range)  fentaNYL (SUBLIMAZE) injection 25 mcg (has no administration in time range)  fentaNYL (SUBLIMAZE) injection 25-100 mcg (has no administration in time range)  midazolam (VERSED) injection 1 mg (has no administration in time range)  midazolam (VERSED) injection 1 mg (has no administration in time range)  ipratropium-albuterol (DUONEB) 0.5-2.5 (3) MG/3ML nebulizer solution (3 mLs  Given 04/30/22 0300)  propofol (DIPRIVAN) 1000 MG/100ML infusion (0 mcg/kg/min  Stopped 04/30/22 0330)  furosemide (LASIX) 10 MG/ML injection (80 mg  Given 04/30/22 0328)    Consults: Cardiology and ICU   EMR reviewed including patient's last visit with his cardiologist from a month ago    FINAL CLINICAL IMPRESSION(S) / ED DIAGNOSES   Final diagnoses:  Cardiac [place] due to [place] disorder [place]  Acute pulmonary edema ([place]     Rx / DC Orders   ED Discharge  Orders     None        Note:  This document was prepared using Dragon voice recognition software and may include unintentional dictation errors.   Please note:  Patient was evaluated in Emergency Department today for the symptoms described in the history of present illness. Patient was evaluated in the context of the global COVID-19 pandemic, which necessitated consideration that the patient might be at risk for infection with the SARS-CoV-2 virus that causes COVID-19. Institutional protocols and algorithms that pertain to the evaluation of patients at risk for COVID-19 are in a state of rapid change based on information  released by regulatory bodies including the CDC and federal and state organizations. These policies and algorithms were followed during the patient's care in the ED.  Some ED evaluations and interventions may be delayed as a result of limited staffing during the pandemic.       Alfred Levins, [place], MD 04/30/22 [phone]

## 2022-04-30 NOTE — Progress Notes (Signed)
Chaplain responded to page to support family. Pts. Wife was present a bedside. She was tearful. Son and partner were also present in room. Chaplain offered compassionate presence and prayer per wife's request. Family were grateful for Chaplains visit. Please call Chaplain if needed.

## 2022-04-30 NOTE — Progress Notes (Signed)
   04/30/22 1800  Clinical Encounter Type  Visited With Patient and family together  Visit Type Initial  Referral From Nurse  Consult/Referral To Chaplain   Chaplain responded to code blue. Chaplain offered support to wife and granddaughter. Chaplain provided compassionate presence and reflective listening as family spoke about patient's health journey. Family shard life stories with Chaplain. Chaplain offered prayer as requested. Family appreciated Chaplain support. Chaplain is available for continued support throughout evening as needed.

## 2022-04-30 NOTE — Progress Notes (Signed)
  1540 took over care of patient patient unable to follow commands rass 3 unable to communicate needs new orders for vent synch wife and granddaughter at bedside    1700 patient BP dropping increase in medication per orders educated family at beside   1720 pt maxed on pressor Dr notified  pt heart rate decreasing pushed atropine called Code Dr at bedside EPI pushed   1726 pressors stopped Pt hypertensive  1731 patient BP dropping rapidly pressors restarted   1740 education to patient wife and granddaughter at bed side on pressors vent 02 needs and patient mentation prior to episode

## 2022-04-30 NOTE — Code Documentation (Addendum)
PATIENT NAME: Joseph Macdonald MEDICAL RECORD NUMBER: [id number] Birthday: Apr 19, 1946  Age: 76 y.o. Admit Date: 04/30/2022  Provider: Renold Don, MD  Indication: Symptomatic bradycardia with loss of pulse, transient  Technical Description:  CPR performance duration: N/A Was defibrillation or cardioversion used ?  No Was external pacer placed ?  Yes Was patient intubated pre/post CPR ?  Already intubated and mechanically ventilated Was transvenous pacer placed ?  No  Medications Administered Include      Yes/no Amiodarone   Atropin      Y  Calcium   Epinephrine       Y  Lidocaine   Magnesium   Norepinephrine   Phenylephrine   Sodium bicarbonate   Vasopression    Evaluation Final Status - Was patient successfully resuscitated ?  Yes If successfully resuscitated - what is current rhythm ?  Tachycardia If successfully resuscitated - what is current hemodynamic status ?  Labile  Miscellaneous Information Patient presented postarrest clear and has been intubated and mechanically ventilated in the ICU.  Developed bradycardia with transient  loss of pulse despite vasopressors on board.  Responded to atropine 1 amp and epinephrine 1 amp.  Compressions never started as patient regained pulse quickly.    Renold Don, MD Advanced Bronchoscopy [facility]  5/21/20235:52 PM

## 2022-04-30 NOTE — Progress Notes (Signed)
[place] for heparin infusion initiation and monitoring Indication: atrial fibrillation  No Known Allergies  Patient Measurements:   Heparin Dosing Weight: 90 kg  Vital Signs: Temp: 100.8 F (38.2 C) (05/21 1115) Temp Source: Bladder (05/21 0800) BP: 137/104 (05/21 0800) Pulse Rate: 64 (05/21 1115)  Labs: Recent Labs    04/30/22 0332 04/30/22 0546 04/30/22 0550  HGB 16.6  --   --   HCT 57.7*  --   --   PLT 189  --   --   LABPROT 23.0*  --   --   INR 2.1*  --   --   CREATININE  --   --  2.27*  TROPONINIHS 373* 1,137*  --     CrCl cannot be calculated (Unknown ideal weight.).   Medical History: Past Medical History:  Diagnosis Date   Arthritis    COPD (chronic obstructive pulmonary disease) ([place])    Diabetes mellitus without complication (HCC)    diet controlled   Dyspnea    with activity   Hyperlipidemia    Hypertension    Myasthenia gravis (HCC)    Oxygen deficit    Ventricular tachycardia (HCC)     Medications:  Scheduled:   budesonide (PULMICORT) nebulizer solution  0.25 mg Nebulization BID   docusate  100 mg Per Tube BID   flecainide  50 mg Per Tube Q12H   insulin aspart  0-20 Units Subcutaneous Q4H   ipratropium-albuterol  3 mL Nebulization Q6H   methylPREDNISolone (SOLU-MEDROL) injection  40 mg Intravenous Q12H   pantoprazole sodium  40 mg Per Tube Daily   polyethylene glycol  17 g Per Tube Daily    Assessment: 76 yo M w/ PMH of atrial fibrillation, COPD, CHF, DM, HLD, HTN presenting to [facility] ED via EMS from home on 04/30/22 in respiratory distress. Prior to admission he was receiving apixaban with last dose unknown. Baseline INR 2.1, H&H/PLT wnl, LFTs elevated  Goal of Therapy:  Heparin level 0.3-0.7 units/ml aPTT 66 - 102 seconds Monitor platelets by anticoagulation protocol: Yes   Plan:  Give 4000 units bolus x 1 Start heparin infusion at 1300 units/hr Check aPTT level in 8 hours and daily while on  heparin until aPTT and heparin level correlate Continue to monitor H&H and platelets  Dallie Piles 04/30/2022,11:29 AM

## 2022-04-30 NOTE — Progress Notes (Signed)
eLink Physician-Brief Progress Note Patient Name: Joseph Macdonald DOB: 09-Sep-1946 MRN: [id number]   Date of Service  04/30/2022  HPI/Events of Note  Patient seen in the ED with chest pain and acute respiratory failure, shortly after arrival he went into cardiac arrest and received 8 minutes of ACLS protocol, following intubation he immediately  achieved ROSC and ET tube secretions were pink and frothy consistent with pulmonary edema, he did have EKG changes but STEMI cardiology felt they did not meet criteria for STEMI protocol. He is intubated and mechanically ventilated at this time.  eICU Interventions  New Patient Evaluation.        [name] 04/30/2022, 5:10 AM

## 2022-05-01 ENCOUNTER — Inpatient Hospital Stay: Payer: PPO

## 2022-05-01 DIAGNOSIS — I469 Cardiac arrest, cause unspecified: Secondary | ICD-10-CM

## 2022-05-01 DIAGNOSIS — I480 Paroxysmal atrial fibrillation: Secondary | ICD-10-CM | POA: Diagnosis not present

## 2022-05-01 LAB — CBC
HCT: 49.2 % (ref 39.0–52.0)
Hemoglobin: 15.3 g/dL (ref 13.0–17.0)
MCH: 29.7 pg (ref 26.0–34.0)
MCHC: 31.1 g/dL (ref 30.0–36.0)
MCV: 95.5 fL (ref 80.0–100.0)
Platelets: 109 10*3/uL — ABNORMAL LOW (ref 150–400)
RBC: 5.15 MIL/uL (ref 4.22–5.81)
RDW: 13.3 % (ref 11.5–15.5)
WBC: 14.6 10*3/uL — ABNORMAL HIGH (ref 4.0–10.5)
nRBC: 0.1 % (ref 0.0–0.2)

## 2022-05-01 LAB — GLUCOSE, CAPILLARY
Glucose-Capillary: 172 mg/dL — ABNORMAL HIGH (ref 70–99)
Glucose-Capillary: 184 mg/dL — ABNORMAL HIGH (ref 70–99)
Glucose-Capillary: 158 mg/dL — ABNORMAL HIGH (ref 70–99)
Glucose-Capillary: 141 mg/dL — ABNORMAL HIGH (ref 70–99)
Glucose-Capillary: 147 mg/dL — ABNORMAL HIGH (ref 70–99)

## 2022-05-01 LAB — BASIC METABOLIC PANEL
Anion gap: 10 (ref 5–15)
BUN: 52 mg/dL — ABNORMAL HIGH (ref 8–23)
CO2: 26 mmol/L (ref 22–32)
Calcium: 7.2 mg/dL — ABNORMAL LOW (ref 8.9–10.3)
Chloride: 107 mmol/L (ref 98–111)
Creatinine, Ser: 1.9 mg/dL — ABNORMAL HIGH (ref 0.61–1.24)
GFR, Estimated: 36 mL/min — ABNORMAL LOW (ref >60)
Glucose, Bld: 151 mg/dL — ABNORMAL HIGH (ref 70–99)
Potassium: 3.5 mmol/L (ref 3.5–5.1)
Sodium: 143 mmol/L (ref 135–145)

## 2022-05-01 LAB — BLOOD GAS, ARTERIAL
Acid-Base Excess: 1 mmol/L (ref 0.0–2.0)
Bicarbonate: 24 mmol/L (ref 20.0–28.0)
FIO2: 100 %
MECHVT: 500 mL
O2 Saturation: 98.6 %
PEEP: 8 cmH2O
Patient temperature: 37
RATE: 24 resp/min
pCO2 arterial: 33 mmHg (ref 32–48)
pH, Arterial: 7.47 — ABNORMAL HIGH (ref 7.35–7.45)
pO2, Arterial: 86 mmHg (ref 83–108)

## 2022-05-01 LAB — PHOSPHORUS
Phosphorus: 3.4 mg/dL (ref 2.5–4.6)
Phosphorus: 3.1 mg/dL (ref 2.5–4.6)

## 2022-05-01 LAB — MAGNESIUM
Magnesium: 2.4 mg/dL (ref 1.7–2.4)
Magnesium: 2.4 mg/dL (ref 1.7–2.4)

## 2022-05-01 LAB — HEPARIN LEVEL (UNFRACTIONATED): Heparin Unfractionated: >1.1 IU/mL — ABNORMAL HIGH (ref 0.30–0.70)

## 2022-05-01 LAB — PROCALCITONIN: Procalcitonin: 1.49 ng/mL

## 2022-05-01 LAB — LACTIC ACID, PLASMA: Lactic Acid, Venous: 2.7 mmol/L (ref 0.5–1.9)

## 2022-05-01 LAB — T3, FREE: T3, Free: 2.2 pg/mL (ref 2.0–4.4)

## 2022-05-01 LAB — APTT
aPTT: 98 seconds — ABNORMAL HIGH (ref 24–36)
aPTT: 83 seconds — ABNORMAL HIGH (ref 24–36)

## 2022-05-01 MED ORDER — VITAL HIGH PROTEIN PO LIQD
1000.0000 mL | ORAL | Status: DC
  Administered 2022-05-01 – 2022-05-05 (×6): 1000 mL

## 2022-05-01 MED ORDER — CHLORHEXIDINE GLUCONATE 0.12% ORAL RINSE (MEDLINE KIT)
15.0000 mL | Freq: Two times a day (BID) | OROMUCOSAL | Status: DC
  Administered 2022-05-01 – 2022-05-06 (×11): 15 mL via OROMUCOSAL

## 2022-05-01 MED ORDER — SODIUM CHLORIDE 0.9 % IV SOLN: 3.0000 g | Freq: Two times a day (BID) | INTRAVENOUS | Status: DC

## 2022-05-01 MED ORDER — ATROPINE SULFATE 1 MG/ML IV SOLN
0.5000 mg | Freq: Once | INTRAVENOUS | Status: DC | PRN
  Filled 2022-05-01 (×2): qty 0.5

## 2022-05-01 MED ORDER — CALCIUM GLUCONATE-NACL 1-0.675 GM/50ML-% IV SOLN
1.0000 g | Freq: Once | INTRAVENOUS | Status: AC
  Administered 2022-05-01: 1000 mg via INTRAVENOUS
  Filled 2022-05-01: qty 50

## 2022-05-01 MED ORDER — ORAL CARE MOUTH RINSE
15.0000 mL | OROMUCOSAL | Status: DC
  Administered 2022-05-01 – 2022-05-06 (×47): 15 mL via OROMUCOSAL

## 2022-05-01 MED ORDER — PROSOURCE TF PO LIQD
45.0000 mL | Freq: Three times a day (TID) | ORAL | Status: DC
  Administered 2022-05-01 – 2022-05-05 (×10): 45 mL
  Filled 2022-05-01: qty 45

## 2022-05-01 MED ORDER — FREE WATER
30.0000 mL | Status: DC
  Administered 2022-05-01 – 2022-05-06 (×21): 30 mL

## 2022-05-01 MED ORDER — SODIUM CHLORIDE 0.9 % IV SOLN
3.0000 g | Freq: Four times a day (QID) | INTRAVENOUS | Status: DC
  Administered 2022-05-01 – 2022-05-04 (×12): 3 g via INTRAVENOUS
  Filled 2022-05-01 (×4): qty 3
  Filled 2022-05-01: qty 8
  Filled 2022-05-01 (×2): qty 3
  Filled 2022-05-01: qty 8
  Filled 2022-05-01 (×5): qty 3

## 2022-05-01 MED ORDER — POTASSIUM CHLORIDE 20 MEQ PO PACK
40.0000 meq | PACK | Freq: Once | ORAL | Status: AC
  Administered 2022-05-01: 40 meq
  Filled 2022-05-01: qty 2

## 2022-05-01 NOTE — Progress Notes (Signed)
EEG complete - results pending 

## 2022-05-01 NOTE — Progress Notes (Signed)
   05/01/22 1415  Clinical Encounter Type  Visited With Patient and family together  Visit Type Initial  Spiritual Encounters  Spiritual Needs Emotional;Grief support   Chaplain provided support through reflective listening, compassionate presence and conversation and the assurance of prayer.

## 2022-05-01 NOTE — Progress Notes (Signed)
Progress Note  Patient Name: Joseph Macdonald Date of Encounter: 05/01/2022  [facility] Cardiologist: [name], MD   Subjective   He is intubated and sedated.  He remains in sinus rhythm with intermittent bradycardia.  Inpatient Medications    Scheduled Meds:  budesonide (PULMICORT) nebulizer solution  0.25 mg Nebulization BID   Chlorhexidine Gluconate Cloth  6 each Topical Daily   docusate  100 mg Per Tube BID   insulin aspart  0-20 Units Subcutaneous Q4H   ipratropium-albuterol  3 mL Nebulization Q6H   methylPREDNISolone (SOLU-MEDROL) injection  40 mg Intravenous Q12H   pantoprazole sodium  40 mg Per Tube Daily   polyethylene glycol  17 g Per Tube Daily   Continuous Infusions:  acetaminophen Stopped (04/30/22 1156)   ampicillin-sulbactam (UNASYN) IV     azithromycin Stopped (04/30/22 1517)   fentaNYL infusion INTRAVENOUS 50 mcg/hr (04/30/22 1855)   heparin 1,200 Units/hr (05/01/22 0900)   midazolam 2 mg/hr (05/01/22 0900)   norepinephrine (LEVOPHED) Adult infusion Stopped (05/01/22 0847)   vasopressin Stopped (05/01/22 0815)   PRN Meds: acetaminophen, atropine, docusate, fentaNYL (SUBLIMAZE) injection, midazolam, polyethylene glycol   Vital Signs    Vitals:   05/01/22 0900 05/01/22 0915 05/01/22 0930 05/01/22 0945  BP:      Pulse: 69 73 67 68  Resp: (!) 22 (!) 22 (!) 22 (!) 22  Temp: 99 F (37.2 C) 98.8 F (37.1 C) 98.8 F (37.1 C) 98.6 F (37 C)  TempSrc:      SpO2: 97% 96% 95% 95%  Weight:        Intake/Output Summary (Last 24 hours) at 05/01/2022 0954 Last data filed at 05/01/2022 0900 Gross per 24 hour  Intake 1216.32 ml  Output 815 ml  Net 401.32 ml      05/01/2022    4:33 AM 04/30/2022   10:00 AM 04/03/2022   11:33 AM  Last 3 Weights  Weight (lbs) 239 lb 3.2 oz 238 lb 1.6 oz 231 lb 6 oz  Weight (kg) 108.5 kg 108 kg 104.951 kg      Telemetry    Sinus rhythm with sinus bradycardia- Personally Reviewed  ECG    EKG from  yesterday showed sinus rhythm with LVH and left anterior fascicular block.- Personally Reviewed  Physical Exam   GEN: Intubated and sedated. Neck: Jugular venous pressure is not well visualized Cardiac: RRR, no murmurs, rubs, or gallops.  Respiratory: Clear to auscultation bilaterally. GI: Soft, nontender, non-distended  MS: No edema; No deformity. Neuro:  Nonfocal  Psych: Normal affect   Labs    High Sensitivity Troponin:   Recent Labs  Lab 04/30/22 0332 04/30/22 0546 04/30/22 1734 04/30/22 1955  TROPONINIHS 373* 1,137* 2,844* 2,210*     Chemistry Recent Labs  Lab 04/30/22 0333 04/30/22 0550 04/30/22 1734 04/30/22 1955  NA  --  139 141  --   K  --  4.4 4.5  --   CL  --  104 104  --   CO2  --  25 26  --   GLUCOSE  --  214* 170*  --   BUN  --  37* 44*  --   CREATININE  --  2.27* 3.18*  --   CALCIUM  --  7.7* 7.4*  --   MG 3.0*  --   --  2.3  PROT  --  6.0* 5.9*  --   ALBUMIN  --  3.3* 3.1*  --   AST  --  702*  1,750*  --   ALT  --  657* 1,349*  --   ALKPHOS  --  40 38  --   BILITOT  --  1.4* 1.7*  --   GFRNONAA  --  29* 20*  --   ANIONGAP  --  10 11  --     Lipids No results for input(s): CHOL, TRIG, HDL, LABVLDL, LDLCALC, CHOLHDL in the last 168 hours.  Hematology Recent Labs  Lab 04/30/22 0332 04/30/22 1734 05/01/22 0431  WBC 12.4* 18.9* 14.6*  RBC 5.53 5.48 5.15  HGB 16.6 16.3 15.3  HCT 57.7* 54.8* 49.2  MCV 104.3* 100.0 95.5  MCH 30.0 29.7 29.7  MCHC 28.8* 29.7* 31.1  RDW 12.9 13.2 13.3  PLT 189 146* 109*   Thyroid  Recent Labs  Lab 04/30/22 0546  TSH 0.441  FREET4 0.88    BNP Recent Labs  Lab 04/30/22 0332  BNP 971.6*    DDimer No results for input(s): DDIMER in the last 168 hours.   Radiology    DG Abd 1 View  Result Date: 04/30/2022 CLINICAL DATA:  OG tube placement EXAM: ABDOMEN - 1 VIEW COMPARISON:  04/30/2022 FINDINGS: OG tube tip is in the stomach.  Nonobstructive bowel gas pattern. IMPRESSION: OG tube tip in the stomach.  Electronically Signed   By: Rolm Baptise M.D.   On: 04/30/2022 22:12   DG Abd 1 View  Result Date: 04/30/2022 CLINICAL DATA:  Repositioning of enteric tube EXAM: ABDOMEN - 1 VIEW COMPARISON:  Study done earlier today FINDINGS: There is interval repositioning of enteric tube with its tip in the region of fundus/body of the stomach. Increased density in the left lower lung fields may suggest infiltrate and pleural effusion. Bowel gas pattern in the upper abdomen is unremarkable. IMPRESSION: Tip enteric tube is more caudal in position in the area of fundus/body of the stomach. Electronically Signed   By: Elmer Picker M.D.   On: 04/30/2022 10:50   DG Abd 1 View  Result Date: 04/30/2022 CLINICAL DATA:  76 year old male status post nasogastric tube placement. EXAM: ABDOMEN - 1 VIEW COMPARISON:  No priors. FINDINGS: Tip of nasogastric tube is in the stomach. Side port is at or immediately proximal to the gastroesophageal junction. Visualized bowel gas pattern is nonobstructive. Transcutaneous defibrillator pad projects over the epigastric region. IMPRESSION: 1. Nasogastric tube is in a slightly high position and could be advanced approximately 10 cm for more optimal placement. Electronically Signed   By: Vinnie Langton M.D.   On: 04/30/2022 07:15   CT HEAD WO CONTRAST (5MM)  Result Date: 05/01/2022 CLINICAL DATA:  76 year old male status post cardiac arrest and CPR. Altered mental status despite weaning sedation. Query anoxic injury. EXAM: CT HEAD WITHOUT CONTRAST TECHNIQUE: Contiguous axial images were obtained from the base of the skull through the vertex without intravenous contrast. RADIATION DOSE REDUCTION: This exam was performed according to the departmental dose-optimization program which includes automated exposure control, adjustment of the mA and/or kV according to patient size and/or use of iterative reconstruction technique. COMPARISON:  Head CTs 04/30/2022 and 11/29/2019. FINDINGS: Brain:  Cerebral volume is stable since 2020. No midline shift, mass effect, or evidence of intracranial mass lesion. No ventriculomegaly No acute intracranial hemorrhage identified. Confluent bilateral cerebral white matter hypodensity, more pronounced in both hemispheres since 2020 although in a similar pattern to that time. Gray-white differentiation at the deep gray nuclei appears preserved, although with some right side thalamic and bilateral basal ganglia heterogeneity. No definite  brainstem or cerebellar edema. No cortically based acute infarct identified. Vascular: Calcified atherosclerosis at the skull base. No suspicious intracranial vascular hyperdensity. Skull: No acute osseous abnormality identified. Sinuses/Orbits: Paranasal sinuses, tympanic cavities and mastoids are well aerated. Other: Partially visible endotracheal tube and oral enteric tube. The enteric tube is looped in the visible oral cavity and pharynx. Postoperative changes to the globes. No acute orbit or scalp soft tissue finding. IMPRESSION: 1. No strong CT evidence of anoxic cerebral injury at this time. Sensitivity is somewhat reduced by evidence of underlying chronic small vessel disease, which does appear progressed in both hemispheres compared to 2020. Repeat Head CT in another 24-48 hours, or alternatively follow-up Brain MRI, would be valuable. 2. Oral enteric tube looped in the oral cavity and pharynx. Recommend repositioning. Electronically Signed   By: Genevie Ann M.D.   On: 05/01/2022 06:20   CT HEAD WO CONTRAST (5MM)  Result Date: 04/30/2022 CLINICAL DATA:  Mental status change. EXAM: CT HEAD WITHOUT CONTRAST TECHNIQUE: Contiguous axial images were obtained from the base of the skull through the vertex without intravenous contrast. RADIATION DOSE REDUCTION: This exam was performed according to the departmental dose-optimization program which includes automated exposure control, adjustment of the mA and/or kV according to patient size  and/or use of iterative reconstruction technique. COMPARISON:  November 29, 2019 FINDINGS: Brain: No subdural, epidural, or subarachnoid hemorrhage. Cerebellum, brainstem, and basal cisterns are normal. Scattered white matter changes again identified. No acute cortical ischemia or infarct. No mass effect or midline shift. Vascular: No hyperdense vessel or unexpected calcification. Skull: Normal. Negative for fracture or focal lesion. Sinuses/Orbits: No acute finding. Other: None. IMPRESSION: Chronic white matter changes.  No acute intracranial abnormalities. Electronically Signed   By: Dorise Bullion III M.D.   On: 04/30/2022 09:44   CT CHEST WO CONTRAST  Result Date: 04/30/2022 CLINICAL DATA:  Difficulty breathing EXAM: CT CHEST WITHOUT CONTRAST TECHNIQUE: Multidetector CT imaging of the chest was performed following the standard protocol without IV contrast. RADIATION DOSE REDUCTION: This exam was performed according to the departmental dose-optimization program which includes automated exposure control, adjustment of the mA and/or kV according to patient size and/or use of iterative reconstruction technique. COMPARISON:  Previous studies including the chest radiograph done earlier today FINDINGS: Cardiovascular: Coronary artery calcifications are seen. Heart is enlarged in size. There is ectasia of ascending thoracic aorta measuring 4 cm. There is ectasia of main pulmonary artery measuring 3.7 cm suggesting pulmonary arterial hypertension. Mediastinum/Nodes: No significant lymphadenopathy seen.There are small pockets of air in the some of the venous structures, possibly introduced during venipuncture. Tip of endotracheal tube is at the level of aortic arch. Enteric tube is noted traversing the esophagus with its tip in the stomach. Lungs/Pleura: There are small patchy infiltrates in the right upper lobe, right middle lobe and left upper lobe. Dense infiltrates are seen in the both lower lobes with air  bronchogram. Small right pleural effusion is seen. There is no pneumothorax. Upper Abdomen: There is subtle increased density in gallbladder lumen. Gallbladder is not distended. Musculoskeletal: Unremarkable. IMPRESSION: There scattered small patchy infiltrates in both lungs, more so in the right upper lobe suggesting multifocal pneumonia. Dense infiltrates with air bronchogram are noted in both lower lobes suggesting atelectasis/pneumonia. Cardiomegaly. Coronary artery calcifications are seen. There is ectasia of main pulmonary artery suggesting pulmonary arterial hypertension. There is ectasia of ascending thoracic aorta measuring 4 cm. Possible gallbladder stones. Electronically Signed   By: Prudy Feeler.D.  On: 04/30/2022 09:53   DG Chest Portable 1 View  Result Date: 04/30/2022 CLINICAL DATA:  Chest pain, cardiopulmonary arrest EXAM: PORTABLE CHEST 1 VIEW COMPARISON:  None Available. FINDINGS: Endotracheal tube is seen 4.9 cm above the carina. Pulmonary insufflation is normal and symmetric. Mild interstitial pulmonary edema is present. No confluent pulmonary infiltrate. No pneumothorax or pleural effusion. Cardiac size is within normal limits when accounting for supine positioning and sub maximal pulmonary insufflation. IMPRESSION: Endotracheal tube in appropriate position. Mild interstitial pulmonary edema. Electronically Signed   By: Fidela Salisbury M.D.   On: 04/30/2022 03:34   ECHOCARDIOGRAM COMPLETE  Result Date: 04/30/2022    ECHOCARDIOGRAM REPORT   Patient Name:   Joseph Macdonald Date of Exam: 04/30/2022 Medical Rec #:  [id number]         Height:       67.0 in Accession #:    [id number]        Weight:       231.4 lb Date of Birth:  02-Sep-1946         BSA:          2.151 m Patient Age:    35 years          BP:           176/107 mmHg Patient Gender: M                 HR:           77 bpm. Exam Location:  [facility] Procedure: 2D Echo and Intracardiac Opacification Agent Indications:     Cardiac  Arrest I46.9  History:         Patient has prior history of Echocardiogram examinations, most                  recent 04/20/2022.  Sonographer:     Kathlen Brunswick RDCS Referring Phys:  [id number] BRITTON L [place] Diagnosing Phys: Kate Sable MD  Sonographer Comments: Technically difficult study due to poor echo windows and echo performed with patient supine and on artificial respirator. Image acquisition challenging due to respiratory motion and Image acquisition challenging due to patient body habitus. IMPRESSIONS  1. Left ventricular ejection fraction, by estimation, is 35 to 40%. The left ventricle has moderately decreased function. The left ventricle demonstrates global hypokinesis. There is mild left ventricular hypertrophy. Left ventricular diastolic parameters are consistent with Grade I diastolic dysfunction (impaired relaxation).  2. Right ventricular systolic function is moderately reduced. The right ventricular size is mildly enlarged.  3. The mitral valve is normal in structure. No evidence of mitral valve regurgitation.  4. The aortic valve is grossly normal. Aortic valve regurgitation is not visualized. FINDINGS  Left Ventricle: Left ventricular ejection fraction, by estimation, is 35 to 40%. The left ventricle has moderately decreased function. The left ventricle demonstrates global hypokinesis. Definity contrast agent was given IV to delineate the left ventricular endocardial borders. The left ventricular internal cavity size was normal in size. There is mild left ventricular hypertrophy. Abnormal (paradoxical) septal motion, consistent with left bundle branch block. Left ventricular diastolic parameters are consistent with Grade I diastolic dysfunction (impaired relaxation). Right Ventricle: The right ventricular size is mildly enlarged. No increase in right ventricular wall thickness. Right ventricular systolic function is moderately reduced. Left Atrium: Left atrial size was normal in  size. Right Atrium: Right atrial size was normal in size. Pericardium: There is no evidence of pericardial effusion. Mitral Valve: The mitral valve is normal in structure.  No evidence of mitral valve regurgitation. Tricuspid Valve: The tricuspid valve is not well visualized. Tricuspid valve regurgitation is not demonstrated. Aortic Valve: The aortic valve is grossly normal. Aortic valve regurgitation is not visualized. Aortic valve peak gradient measures 3.4 mmHg. Pulmonic Valve: The pulmonic valve was not well visualized. Pulmonic valve regurgitation is not visualized. Aorta: The aortic root and ascending aorta are structurally normal, with no evidence of dilitation. Venous: The inferior vena cava was not well visualized. IAS/Shunts: No atrial level shunt detected by color flow Doppler.  LEFT VENTRICLE PLAX 2D LVIDd:         4.50 cm     Diastology LVIDs:         3.50 cm     LV e' medial:    5.77 cm/s LV PW:         1.30 cm     LV E/e' medial:  3.9 LV IVS:        1.30 cm     LV e' lateral:   5.87 cm/s LVOT diam:     2.10 cm     LV E/e' lateral: 3.8 LV SV:         32 LV SV Index:   15 LVOT Area:     3.46 cm  LV Volumes (MOD) LV vol d, MOD A2C: 68.5 ml LV vol d, MOD A4C: 98.5 ml LV vol s, MOD A2C: 35.9 ml LV vol s, MOD A4C: 53.4 ml LV SV MOD A2C:     32.6 ml LV SV MOD A4C:     98.5 ml LV SV MOD BP:      38.3 ml RIGHT VENTRICLE RV Basal diam:  3.70 cm RV S prime:     9.90 cm/s TAPSE (M-mode): 1.3 cm LEFT ATRIUM             Index        RIGHT ATRIUM           Index LA diam:        3.40 cm 1.58 cm/m   RA Area:     14.80 cm LA Vol (A2C):   35.1 ml 16.31 ml/m  RA Volume:   38.90 ml  18.08 ml/m LA Vol (A4C):   54.3 ml 25.24 ml/m LA Biplane Vol: 48.3 ml 22.45 ml/m  AORTIC VALVE                 PULMONIC VALVE AV Area (Vmax): 2.30 cm     PV Vmax:       0.58 m/s AV Vmax:        92.30 cm/s   PV Peak grad:  1.4 mmHg AV Peak Grad:   3.4 mmHg LVOT Vmax:      61.40 cm/s LVOT Vmean:     46.400 cm/s LVOT VTI:       0.093 m   AORTA Ao Root diam: 3.60 cm Ao Asc diam:  3.60 cm MITRAL VALVE MV Area (PHT): 3.26 cm    SHUNTS MV Decel Time: 233 msec    Systemic VTI:  0.09 m MV E velocity: 22.30 cm/s  Systemic Diam: 2.10 cm MV A velocity: 51.00 cm/s MV E/A ratio:  0.44 Kate Sable MD Electronically signed by Kate Sable MD Signature Date/Time: 04/30/2022/1:28:45 PM    Final     Cardiac Studies   I personally reviewed the echocardiogram done yesterday which showed moderately reduced LV systolic function with an EF of 35% with global hypokinesis.  Wall motion abnormalities could not  be excluded.  In addition, the right ventricle was mildly dilated with moderately reduced function.  I reviewed his prior echocardiogram done earlier this month which showed mildly reduced LV systolic function.  Patient Profile     76 y.o. male  with history of HFpEF, hypertension, VT, paroxysmal A-fib, hyperlipidemia, COPD, myasthenia gravis who presents to the hospital due to worsening shortness of breath and severe respiratory distress.  The patient developed PEA arrest upon presentation with 8 minutes of CPR.  No shockable rhythm.  Assessment & Plan    1.  PEA cardiac arrest: Suspect likely due to pulmonary etiology.  Cannot exclude myasthenia gravis contributing to respiratory muscle dysfunction and aspiration.  Even though troponin is mildly elevated, there is no evidence of acute coronary syndrome.  Echocardiogram does show a drop in ejection fraction compared to earlier this month but this could be due to myocardial stunning.  Continue supportive care for now.  2.  Acute on chronic systolic heart failure: EF of 35% by echo and previously was 45 to 50%.  Unfortunately, the patient appears to be in multiorgan failure with acute renal failure as well as shock liver.  Given acute renal failure, the patient might require dialysis. If the patient makes meaningful recovery, right and left cardiac catheterization will be pursued during  this admission.  3.  Paroxysmal atrial fibrillation: Given drop in ejection fraction and worsening renal function, I discontinued flecainide.  Continue anticoagulation with heparin.  I discussed the patient's case with Dr. Mortimer Fries  Total encounter time more than 50 minutes. Greater than 50% was spent in counseling and coordination of care with the patient.      For questions or updates, please contact [place] Please consult [email or web address] for contact info under        Signed, Kathlyn Sacramento, MD  05/01/2022, 9:54 AM

## 2022-05-01 NOTE — Procedures (Signed)
Routine EEG Report  Joseph Macdonald is a 76 y.o. male with a history of cardiac arrest who is undergoing an EEG to evaluate for seizures.  Report: This EEG was acquired with electrodes placed according to the International 10-20 electrode system (including Fp1, Fp2, F3, F4, C3, C4, P3, P4, O1, O2, T3, T4, T5, T6, A1, A2, Fz, Cz, Pz). The following electrodes were missing or displaced: none.  The best background was continuous at 5-7 Hz. This activity is reactive to stimulation. No sleep architecture was identified. There was no focal slowing. There were no interictal epileptiform discharges. There were no electrographic seizures identified. Photic stimulation and hyperventilation were not performed.   Impression and clinical correlation: This EEG was obtained while sedated on midazolam and fentanyl and was abnormal due to mild-to-moderate diffuse slowing indicative of global cerebral dysfunction. Epileptiform abnormalities were not seen during this recording.  Su Monks, MD [facility] [phone]  If 7pm- 7am, please page neurology on call as listed in [place].

## 2022-05-01 NOTE — Progress Notes (Signed)
Nutrition Follow-up  DOCUMENTATION CODES:   Obesity unspecified  INTERVENTION:   -Initiate TF via OGT:   Vital High Protein @ 20 ml/hr via OGT and increase by 10 ml every 4 hours to goal rate of 50 ml/hr.    45 ml Prosource TF TID.     30 ml free water flush every 4 hours for tube patency   Tube feeding regimen provides 1320 kcal (100% of needs), 138 grams of protein, and 1003 ml of H2O. Total free water: 1183 ml daily  NUTRITION DIAGNOSIS:   Inadequate oral intake related to inability to eat as evidenced by NPO status.  Ongoing  GOAL:   Provide needs based on ASPEN/SCCM guidelines  Progressing   MONITOR:   Vent status  REASON FOR ASSESSMENT:   Ventilator    ASSESSMENT:   Pt presented with cardiac arrest. PMH HTN, HLD, MG, COPD, and arthritis.  Patient is currently intubated on ventilator support MV: 10.1 L/min Temp (24hrs), Avg:100.2 F (37.9 C), Min:97.9 F (36.6 C), Max:100.9 F (38.3 C)  Reviewed I/O's: +673 ml x 24 hours and +684 ml since admission  UOP: 730 ml x 24 hours   Case discussed with MD, RN, and during ICU rounds. Per RN, pt had regurgitation and abdominal pain every time he ate PTA. Pt has been off pressors for the past 2 hours. Plan for EEG today. Pt with possible anoxic brain injury. MD plans for family conference today. RD received permission to start TF via OGT.   Medications reviewed and include colace, miralax, levophed, and solu-medrol.  Labs reviewed: CBGS: 158-184 (inpatient orders for glycemic control are 0-20 units insulin aspart every 4 hours).    NUTRITION - FOCUSED PHYSICAL EXAM:  Flowsheet Row Most Recent Value  Orbital Region No depletion  Upper Arm Region No depletion  Thoracic and Lumbar Region No depletion  Buccal Region No depletion  Temple Region No depletion  Clavicle Bone Region No depletion  Clavicle and Acromion Bone Region No depletion  Scapular Bone Region No depletion  Dorsal Hand No depletion   Patellar Region No depletion  Anterior Thigh Region No depletion  Posterior Calf Region No depletion  Edema (RD Assessment) Moderate  Hair Reviewed  Eyes Reviewed  Mouth Reviewed  Skin Reviewed  Nails Reviewed       Diet Order:   Diet Order             Diet NPO time specified  Diet effective now                   EDUCATION NEEDS:   No education needs have been identified at this time  Skin:  Skin Assessment: Reviewed RN Assessment  Last BM:  Unknown  Height:   Ht Readings from Last 1 Encounters:  04/03/22 '5\' 7"'$  (1.702 m)    Weight:   Wt Readings from Last 1 Encounters:  05/01/22 108.5 kg    Ideal Body Weight:  67.3 kg  BMI:  Body mass index is 37.46 kg/m.  Estimated Nutritional Needs:   Kcal:  1749-4496  Protein:  > 135 grams  Fluid:  > 1.2 L    Loistine Chance, RD, LDN, [place] Registered Dietitian II Certified Diabetes Care and Education Specialist Please refer to [facility] for RD and/or RD on-call/weekend/after hours pager

## 2022-05-01 NOTE — Progress Notes (Signed)
S: Patient unable to tolerate exam off sedation. CT head showed no definitive e/o anoxic brain injury. rEEG pending.  O:   Vitals:   05/01/22 1730 05/01/22 1745  BP:    Pulse: (!) 47 (!) 50  Resp: (!) 22 (!) 22  Temp: (!) 97.3 F (36.3 C) (!) 97.3 F (36.3 C)  SpO2: 97% 97%   Exam on midazolam and fentanyl, unable to tolerate exam off sedation 2/2 VS instability  Gen: sedated, lying in bed Resp: ventilated CV: RRR  Neurologic exam MS: no response to noxious stimuli on sedation Speech: intubated, no attempts to speak CN: pupils 8m ER sluggishly reactive to light, (-) corneals/oculocephalics/cough/gag Motor & sensory: no response to noxious stimuli Reflexes: 1+ symm throughout, toes mute bilat  A/P: 76yo man with hx MG now s/p in hospital arrest 36 hrs ago favored to be 2/2 primary cardiac etiology. His MG is not felt to be contributing to present encephalopathy. CT showed no definitive e/o anoxic brain injury. He is not stable for MRI. rEEG pending. Patient was made DNAR tonight by family and they are considering comfort care. Will f/u tomorrow.  CSu Monks MD [facility] [phone] If 7pm- 7am, please page neurology on call as listed in [place]

## 2022-05-01 NOTE — Progress Notes (Signed)
ANTICOAGULATION CONSULT NOTE  Pharmacy Consult for heparin infusion initiation and monitoring Indication: atrial fibrillation  No Known Allergies  Patient Measurements: Weight: 108.5 kg (239 lb 3.2 oz) Heparin Dosing Weight: 90 kg  Vital Signs: Temp: 99.7 F (37.6 C) (05/22 0500) BP: 93/73 (05/21 1749) Pulse Rate: 61 (05/22 0500)  Labs: Recent Labs    04/30/22 0332 04/30/22 0546 04/30/22 0550 04/30/22 1226 04/30/22 1734 04/30/22 1955 04/30/22 2109 05/01/22 0431  HGB 16.6  --   --   --  16.3  --   --  15.3  HCT 57.7*  --   --   --  54.8*  --   --  49.2  PLT 189  --   --   --  146*  --   --  109*  APTT  --   --   --  33  --   --  110* 98*  LABPROT 23.0*  --   --   --   --   --  31.8*  --   INR 2.1*  --   --   --   --   --  3.1*  --   CREATININE  --   --  2.27*  --  3.18*  --   --   --   TROPONINIHS 373* 1,137*  --   --  2,844* 2,210*  --   --      Estimated Creatinine Clearance: 23.6 mL/min (A) (by C-G formula based on SCr of 3.18 mg/dL (H)).   Medical History: Past Medical History:  Diagnosis Date   Arthritis    COPD (chronic obstructive pulmonary disease) ([place])    Diabetes mellitus without complication (HCC)    diet controlled   Dyspnea    with activity   Hyperlipidemia    Hypertension    Myasthenia gravis (HCC)    Oxygen deficit    Ventricular tachycardia (HCC)     Medications:  Scheduled:   budesonide (PULMICORT) nebulizer solution  0.25 mg Nebulization BID   Chlorhexidine Gluconate Cloth  6 each Topical Daily   docusate  100 mg Per Tube BID   flecainide  50 mg Per Tube Q12H   insulin aspart  0-20 Units Subcutaneous Q4H   ipratropium-albuterol  3 mL Nebulization Q6H   methylPREDNISolone (SOLU-MEDROL) injection  40 mg Intravenous Q12H   pantoprazole sodium  40 mg Per Tube Daily   polyethylene glycol  17 g Per Tube Daily    Assessment: 76 yo M w/ PMH of atrial fibrillation, COPD, CHF, DM, HLD, HTN presenting to [facility] ED via EMS from home on  04/30/22 in respiratory distress. Prior to admission he was receiving apixaban with last dose unknown. Baseline INR 2.1, H&H/PLT wnl, LFTs elevated  Goal of Therapy:  Heparin level 0.3-0.7 units/ml aPTT 66 - 102 seconds Monitor platelets by anticoagulation protocol: Yes   Results Date/time HL/aPTT Comments 0521'@2109'$  aPTT 110 Supratherapeutic @ 1300un/hr 5/22 0431 aPTT 98 Therapeutic x 1  Plan:  Continue heparin infusion to 1200 units/hr Check aPTT level in 8 hr to confirm Daily HL until aPTT and heparin level correlate Continue to monitor H&H and platelets  Renda Rolls, PharmD, [facility] 05/01/2022 5:41 AM

## 2022-05-01 NOTE — Progress Notes (Signed)
Patient transported to CT. Once he was placed in the CT machine his heart rate dropped to 31. Atropine administered without incident.

## 2022-05-01 NOTE — IPAL (Signed)
  Interdisciplinary Goals of Care Family Meeting   Date carried out: 05/01/2022  Location of the meeting: Conference room  Member's involved: Physician and Family Member or next of kin    GOALS OF CARE DISCUSSION  The Clinical status was relayed to family in detail- Wife, Daughter, 2 Sons and 2 daughters in Sports coach  Updated and notified of patients medical condition- Patient remains unresponsive and will not open eyes to command.   Patient is having a weak cough and struggling to remove secretions.   Patient with increased WOB and using accessory muscles to breathe Explained to family course of therapy and the modalities   Patient with Progressive multiorgan failure with a very high probablity of a very minimal chance of meaningful recovery despite all aggressive and optimal medical therapy.    Family understands the situation.  They have consented and agreed to DNR/DNI and would like to proceed with Comfort care measures.  Family are satisfied with Plan of action and management. All questions answered  Additional CC time 30 mins   Aldea Avis Patricia Pesa, M.D.  [facility]  Medical Director [place] Director [facility] Cardio-Pulmonary Department

## 2022-05-01 NOTE — Progress Notes (Signed)
NAME:  Joseph Macdonald, MRN:  [id number], DOB:  09/10/1946, LOS: 1 ADMISSION DATE:  04/30/2022, CONSULTATION DATE:  04/30/22 REFERRING MD:  Dr. Alfred Levins, CHIEF COMPLAINT:  Respiratory distress   History of Present Illness:  76 yo M presenting to [facility] ED via EMS from home on 04/30/22 in respiratory distress. Per documentation patient was found by EMS hypoxic with SpO2 71%-88% placed on NRB with albuterol administered. Family reports the patient was in his normal state of health but had NOT been taking his Lasix prescription. They believe he was taking the rest of his medication as prescribed but did not take his nighttime medications on Saturday. Family denies fever/chills, vomiting, slurred speech, weakness. They did report he had some nausea, one episode of diarrhea and mild nasal congestion as well as a non productive cough. They report he has chronic shortness of breath which started to appear more severe on Saturday.  They confirmed that the patient does not wear oxygen at home. Per report he quit smoking in 2018 after a 50 pack year history, drinks ETOH occasionally and has no recreational drug use history.   ED course: Upon arrival, per EDP report the patient was in severe respiratory distress and "purple". Before they were able to intervene, patient lost pulses. He was not on telemetry yet and it was unclear what was the initial rhythm. Patient received CPR for 8 minutes no defibrillations, and was emergently intubated requiring mechanical ventilatory support. Upon intubation, EDP noted frothy respiratory secretions. ROSC obtained and patient extremely hypertensive with SBP in the 200's. Lasix given and propofol started. SBP subsequently dropped into the 60's, propofol stopped and levophed started. EKG showing some inferolateral STE, Dr. Alfred Levins spoke with STEMI cardiologist Dr. Fletcher Anon who reviewed EKG and determined the patient did not meet STEMI criteria. Medications given: 80 mg Lasix IV,  propofol started then stopped, levophed drip started  Significant labs: (Labs/ Imaging personally reviewed) EKG Interpretation: Date: 04/30/22, EKG Time: 03:38, Rate: 63, Rhythm: NSR, QRS Axis:  LAD, Intervals: 1st degree HB, ST/T Wave abnormalities: Inferolateral STE, non specific T wave inversions , Narrative Interpretation: NSR with 1st dgree HB & LAD with inferolateral STE that does not meet STEMI criteria per cardiology review Hematology: mild leukocytosis WBC: 12.4, Hgb: 16.6,  Troponin: 373, BNP: 971.6, lactic/PCT: pending,  COVID-19 & Influenza A/B NEG CXR 04/30/22: Mild interstitial pulmonary edema CT chest 5/21 b/l pneumonia lower lobes PCCM consulted for admission due to acute hypoxic respiratory failure and subsequent cardiac arrest with circulatory shock requiring mechanical ventilation and vasopressor support.  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 5/22 remains intubated, on vent  Interim History / Subjective:  Remains critically ill On pressors  S/p near cardiac arrest yesterday Remains full code Prognosis is poor Patient with underlying diseases that are chronic and debilitating  Objective   Blood pressure 93/73, pulse (!) 58, temperature 99.5 F (37.5 C), resp. rate (!) 22, weight 108.5 kg, SpO2 97 %. CVP:  [0 mmHg-33 mmHg] 1 mmHg  Vent Mode: PRVC FiO2 (%):  [90 %-100 %] 90 % Set Rate:  [24 bmp] 24 bmp Vt Set:  [500 mL] 500 mL PEEP:  [8 cmH20] 8 cmH20 Plateau Pressure:  [  15 cmH20-19 cmH20] 19 cmH20   Intake/Output Summary (Last 24 hours) at 05/01/2022 0720 Last data filed at 05/01/2022  0700 Gross per 24 hour  Intake 1402.54 ml  Output 730 ml  Net 672.54 ml   Filed Weights   04/30/22 1000 05/01/22 0433  Weight: 108 kg 108.5 kg    REVIEW OF SYSTEMS  PATIENT IS UNABLE TO PROVIDE COMPLETE REVIEW OF SYSTEMS DUE TO SEVERE CRITICAL ILLNESS AND TOXIC METABOLIC ENCEPHALOPATHY   PHYSICAL EXAMINATION:  GENERAL:critically ill appearing, +resp distress EYES: Pupils equal, round, reactive to light.  No scleral icterus.  MOUTH: Moist mucosal membrane. INTUBATED NECK: Supple.  PULMONARY: +rhonchi, +wheezing CARDIOVASCULAR: S1 and S2.  No murmurs  GASTROINTESTINAL: Soft, nontender, -distended. Positive bowel sounds.  MUSCULOSKELETAL: +edema.  NEUROLOGIC: obtunded SKIN:intact,warm,dry     Assessment & Plan:   76 yo male with dx of combined dCHF and sCHF with acute aspiration pneumonia with with CARDIOGENIC/septic shock with acute COPD exacerbation leading to severe hypoxic resp failure on MV support with s/p  cardiac arrest  Severe ACUTE Hypoxic and Hypercapnic Respiratory Failure NSTEMI, COPD, PROGRESSIVE MUSCLE WEAKNESS WITH MG -continue Mechanical Ventilator support -Wean Fio2 and PEEP as tolerated -VAP/VENT bundle implementation - Wean PEEP & FiO2 as tolerated, maintain SpO2 > 88% - Head of bed elevated 30 degrees, VAP protocol in place - Plateau pressures less than 30 cm H20  - Intermittent chest x-ray & ABG PRN - Ensure adequate pulmonary hygiene  -will perform SAT/SBT when respiratory parameters are met   ACUTE SYSTOLIC/DIASTOLIC CARDIAC FAILURE- s/p CARDIAC ARREST  NSTEMI  Cardiac arrest: unknown initial rhythm   Circulatory shock Acute on Chronic combined systolic and diastolic CHF exacerbation Atrial Fibrillation PMHx: HTN, HLD, combined CHF, A-fib on Eliquis, HCM CONTINUE HEPARIN INFUSION - Cardiology consulted, appreciate input - continuous cardiac monitoring    SHOCK SOURCE-cardiogenic >sepsis -use vasopressors to keep MAP>65 as  needed -follow ABG and LA as needed -follow up cultures -emperic ABX  SEVERE COPD EXACERBATION -continue IV steroids as prescribed -continue NEB THERAPY as prescribed Former smoker (50 pack year history)   NEUROLOGY ACUTE TOXIC METABOLIC ENCEPHALOPATHY MAY HAVE UNDERLYING BRAIN DAMAGE Acute hypoxic respiratory failure suspect secondary to pulmonary edema vs muscle weakness? in the setting of COPD, combined CHF and medication non compliance &  Myasthenia Gravis query crisis - f/u thyroid panel, f/u acetylcholine receptor Ab and receptor labs - solu-medrol 40 mg BID - consulted neurology to assist with myasthenia gravis management and medication regimen    ACUTE KIDNEY INJURY/Renal Failure -continue Foley Catheter-assess need -Avoid nephrotoxic agents -Follow urine output, BMP -Ensure adequate renal perfusion, optimize oxygenation -Renal dose medications   Intake/Output Summary (Last 24 hours) at 05/01/2022 0731 Last data filed at 05/01/2022 6913 Gross per 24 hour  Intake 1402.54 ml  Output 815 ml  Net 587.54 ml    ENDO - ICU hypoglycemic\Hyperglycemia protocol -check FSBS per protocol   GI GI PROPHYLAXIS as indicated Transaminitis in the setting of cardiac arrest - Trend hepatic function - avoid hepatotoxic agents   NUTRITIONAL STATUS DIET-->TF's as tolerated Constipation protocol as indicated    ELECTROLYTES -follow labs as needed -replace as needed -pharmacy consultation and following      Best Practice (right click and "Reselect all SmartList Selections" daily)  Diet/type: NPO w/ meds via tube DVT prophylaxis: SCD GI prophylaxis: PPI Lines: Central line, Arterial Line, and yes and it is still needed Foley:  Yes, and it is still needed Code Status:  full code  Labs   CBC: Recent Labs  Lab 04/30/22 0332 04/30/22 1734 05/01/22 0431  WBC 12.4* 18.9* 14.6*  NEUTROABS 9.4* 15.8*  --   HGB 16.6 16.3 15.3  HCT 57.7* 54.8* 49.2  MCV 104.3*  100.0 95.5  PLT 189 146* 109*    Basic Metabolic Panel: Recent Labs  Lab 04/30/22 0333 04/30/22 0550 04/30/22 1734 04/30/22 1955  NA  --  139 141  --   K  --  4.4 4.5  --   CL  --  104 104  --   CO2  --  25 26  --   GLUCOSE  --  214* 170*  --   BUN  --  37* 44*  --   CREATININE  --  2.27* 3.18*  --   CALCIUM  --  7.7* 7.4*  --   MG 3.0*  --   --  2.3  PHOS 6.6*  --   --  4.7*   GFR: Estimated Creatinine Clearance: 23.6 mL/min (A) (by C-G formula based on SCr of 3.18 mg/dL (H)). Recent Labs  Lab 04/30/22 0332 04/30/22 0546 04/30/22 0603 04/30/22 1149 04/30/22 1730 04/30/22 1734 04/30/22 1955 05/01/22 0431  PROCALCITON  --  0.24  --   --   --   --   --  1.49  WBC 12.4*  --   --   --   --  18.9*  --  14.6*  LATICACIDVEN  --   --    < > 2.2* 4.2*  --  2.8* 2.7*   < > = values in this interval not displayed.    Liver Function Tests: Recent Labs  Lab 04/30/22 0550 04/30/22 1734  AST 702* 1,750*  ALT 657* 1,349*  ALKPHOS 40 38  BILITOT 1.4* 1.7*  PROT 6.0* 5.9*  ALBUMIN 3.3* 3.1*   No results for input(s): LIPASE, AMYLASE in the last 168 hours. No results for input(s): AMMONIA in the last 168 hours.  ABG    Component Value Date/Time   PHART 7.47 (H) 05/01/2022 0612   PCO2ART 33 05/01/2022 0612   PO2ART 86 05/01/2022 0612   HCO3 24.0 05/01/2022 0612   ACIDBASEDEF 1.9 04/30/2022 0804   O2SAT 98.6 05/01/2022 0612     Coagulation Profile: Recent Labs  Lab 04/30/22 0332 04/30/22 2109  INR 2.1* 3.1*    Cardiac Enzymes: No results for input(s): CKTOTAL, CKMB, CKMBINDEX, TROPONINI in the last 168 hours.  HbA1C: Hemoglobin A1C  Date/Time Value Ref Range Status  12/21/2015 12:00 AM 6.3  Final   Hgb A1c MFr Bld  Date/Time Value Ref Range Status  04/30/2022 05:46 AM 6.9 (H) 4.8 - 5.6 % Final    Comment:    (NOTE) Pre diabetes:          5.7%-6.4%  Diabetes:              >6.4%  Glycemic control for   <7.0% adults with diabetes      CBG: Recent Labs  Lab 04/30/22 1153 04/30/22 1548 04/30/22 1941 04/30/22 2340 05/01/22 0422  GLUCAP 153* 166* 147* 209* 184*   Allergies No Known Allergies   Home Medications  Prior to Admission medications   Medication Sig Start Date End Date Taking? Authorizing Provider  albuterol (PROVENTIL HFA;VENTOLIN HFA) 108 (90 Base) MCG/ACT inhaler Inhale into the lungs every 6 (six) hours as needed for wheezing or shortness of breath.    [provider]  apixaban (ELIQUIS) 5 MG TABS tablet Take 5 mg by  mouth 2 (two) times daily.    [provider]  budesonide-formoterol (SYMBICORT) 160-4.5 MCG/ACT inhaler Inhale 2 puffs into the lungs 2 (two) times daily. 06/16/19   [name], MD  cholecalciferol (VITAMIN D) 25 MCG (1000 UNIT) tablet Take 2,000 Units by mouth daily.    [provider]  empagliflozin (JARDIANCE) 25 MG TABS tablet Take by mouth daily.    [provider]  famotidine (PEPCID) 20 MG tablet Take 20 mg by mouth as needed for heartburn or indigestion.    [provider]  flecainide (TAMBOCOR) 50 MG tablet Take 50 mg by mouth 2 (two) times daily.    [provider]  HYDROcodone-acetaminophen (NORCO/VICODIN) 5-325 MG tablet Take 1 tablet by mouth every 6 (six) hours as needed for moderate pain. 04/04/22   [name], MD  omeprazole (PRILOSEC) 40 MG capsule Take 40 mg by mouth 2 (two) times daily.     [provider]  pramipexole (MIRAPEX) 0.5 MG tablet Take 1 tablet (0.5 mg total) by mouth at bedtime. 06/04/21   [name], MD  pravastatin (PRAVACHOL) 20 MG tablet Take 20 mg by mouth daily.    [provider]  predniSONE (DELTASONE) 20 MG tablet Take 15 mg by mouth daily with breakfast.    [provider]  pyridostigmine (MESTINON) 60 MG tablet Take 30 mg by mouth 3 (three) times daily.    [provider]  terbinafine (LAMISIL) 250 MG tablet Take 250 mg by mouth daily. 06/03/21    [provider]  tiotropium (SPIRIVA) 18 MCG inhalation capsule Place 18 mcg into inhaler and inhale daily.    [provider]  torsemide (DEMADEX) 20 MG tablet Take 1 tablet (20 mg) by mouth 3 days a week 10/13/21   Deboraha Sprang, MD  vitamin B-12 (CYANOCOBALAMIN) 1000 MCG tablet Take 1,000 mcg by mouth daily.    [provider]       DVT/GI PRX  assessed I Assessed the need for Labs I Assessed the need for Foley I Assessed the need for Central Venous Line Family Discussion when available I Assessed the need for Mobilization I made an Assessment of medications to be adjusted accordingly Safety Risk assessment completed  CASE DISCUSSED IN MULTIDISCIPLINARY ROUNDS WITH ICU TEAM     Critical Care Time devoted to patient care services described in this note is 55 minutes.  Critical care was necessary to treat /prevent imminent and life-threatening deterioration. Overall, patient is critically ill, prognosis is guarded.  Patient with Multiorgan failure and at high risk for cardiac arrest and death.    Corrin Parker, M.D.  [facility]  Medical Director [place] Director [facility] Cardio-Pulmonary Department

## 2022-05-01 NOTE — Consult Note (Signed)
PHARMACY CONSULT NOTE  Pharmacy Consult for Electrolyte Monitoring and Replacement   Recent Labs: Potassium (mmol/L)  Date Value  05/01/2022 3.5   Magnesium (mg/dL)  Date Value  05/01/2022 2.4   Calcium (mg/dL)  Date Value  05/01/2022 7.2 (L)   Albumin (g/dL)  Date Value  04/30/2022 3.1 (L)  04/03/2022 4.1   Phosphorus (mg/dL)  Date Value  05/01/2022 3.4   Sodium (mmol/L)  Date Value  05/01/2022 143  04/03/2022 149 (H)   Assessment: Patient is a 76 y/o M with medical history including Afib on Eliquis, COPD, combined systolic and diastolic CHF, K9TO, HLD, HTN, myasthenia gravis, ventricular tachycardia who presented to the ED 5/21 early AM with chest pain and respiratory distress. Patient experienced cardiac arrest with attainment of ROSC after 8 minutes of CPR. Patient is currently intubated, sedated, and on mechanical ventilation in the ICU. Pharmacy consulted to assist with electrolyte monitoring and replacement as indicated.  AKI resolving  Goal of Therapy:  Electrolytes within normal limits  Plan:  --K 3.5, Kcl 40 mEq per tube x 1 dose --Ca 7.2, last albumin was 3.1 yesterday. Will go ahead and give IV calcium gluconate 1 g x 1 dose given ongoing issues with bradycardia. Consider additional dose later today if clinically indicated --Follow-up electrolytes with AM labs tomorrow --Avoid IV magnesium if possible given history of myasthenia gravis  [name] 05/01/2022 11:53 AM

## 2022-05-01 NOTE — Progress Notes (Addendum)
ANTICOAGULATION CONSULT NOTE  Pharmacy Consult for IV Heparin Indication: atrial fibrillation  Patient Measurements: Weight: 108.5 kg (239 lb 3.2 oz) Heparin Dosing Weight: 90 kg  Labs: Recent Labs    04/30/22 0332 04/30/22 0546 04/30/22 0550 04/30/22 1226 04/30/22 1734 04/30/22 1955 04/30/22 2109 05/01/22 0431 05/01/22 1050 05/01/22 1210  HGB 16.6  --   --   --  16.3  --   --  15.3  --   --   HCT 57.7*  --   --   --  54.8*  --   --  49.2  --   --   PLT 189  --   --   --  146*  --   --  109*  --   --   APTT  --   --   --    < >  --   --  110* 98*  --  83*  LABPROT 23.0*  --   --   --   --   --  31.8*  --   --   --   INR 2.1*  --   --   --   --   --  3.1*  --   --   --   HEPARINUNFRC  --   --   --   --   --   --   --   --   --  >1.10*  CREATININE  --   --  2.27*  --  3.18*  --   --   --  1.90*  --   TROPONINIHS 373* 1,137*  --   --  2,844* 2,210*  --   --   --   --    < > = values in this interval not displayed.    Estimated Creatinine Clearance: 39.5 mL/min (A) (by C-G formula based on SCr of 1.9 mg/dL (H)).   Medical History: Past Medical History:  Diagnosis Date   Arthritis    COPD (chronic obstructive pulmonary disease) ([place])    Diabetes mellitus without complication (HCC)    diet controlled   Dyspnea    with activity   Hyperlipidemia    Hypertension    Myasthenia gravis ([place])    Oxygen deficit    Ventricular tachycardia [facility])    Assessment: 76 yo M w/ PMH of atrial fibrillation, COPD, CHF, DM, HLD, HTN presenting to [facility] ED via EMS from home on 04/30/22 in respiratory distress. Prior to admission he was receiving apixaban with last dose unknown. Baseline INR 2.1, H&H/PLT wnl, LFTs elevated  Goal of Therapy:  Heparin level 0.3-0.7 units/ml aPTT 66 - 102 seconds Monitor platelets by anticoagulation protocol: Yes   Results Date/time HL/aPTT Comments 5/21 2109 aPTT 110 Suprather; 1300 un/hr 5/22 0431 aPTT 98 Thera x 1; 1200 un/hr 5/22 1210 aPTT 83 Thera x  2, 1200 un/hr  Plan:  --aPTT is therapeutic x 2 --Continue heparin infusion at 1200 units/hr --aPTT and HL tomorrow AM; follow aPTT until correlation established --Daily CBC per protocol while on IV heparin  [name]  05/01/2022 1:41 PM

## 2022-05-02 ENCOUNTER — Inpatient Hospital Stay: Payer: PPO

## 2022-05-02 ENCOUNTER — Other Ambulatory Visit: Payer: PPO

## 2022-05-02 DIAGNOSIS — I469 Cardiac arrest, cause unspecified: Secondary | ICD-10-CM | POA: Diagnosis not present

## 2022-05-02 DIAGNOSIS — J989 Respiratory disorder, unspecified: Secondary | ICD-10-CM | POA: Diagnosis not present

## 2022-05-02 DIAGNOSIS — I480 Paroxysmal atrial fibrillation: Secondary | ICD-10-CM

## 2022-05-02 DIAGNOSIS — J810 Acute pulmonary edema: Secondary | ICD-10-CM

## 2022-05-02 DIAGNOSIS — G9340 Encephalopathy, unspecified: Secondary | ICD-10-CM

## 2022-05-02 DIAGNOSIS — I5020 Unspecified systolic (congestive) heart failure: Secondary | ICD-10-CM

## 2022-05-02 DIAGNOSIS — J9600 Acute respiratory failure, unspecified whether with hypoxia or hypercapnia: Secondary | ICD-10-CM

## 2022-05-02 DIAGNOSIS — Z515 Encounter for palliative care: Secondary | ICD-10-CM | POA: Diagnosis not present

## 2022-05-02 DIAGNOSIS — I468 Cardiac arrest due to other underlying condition: Secondary | ICD-10-CM

## 2022-05-02 DIAGNOSIS — Z7189 Other specified counseling: Secondary | ICD-10-CM

## 2022-05-02 DIAGNOSIS — Z66 Do not resuscitate: Secondary | ICD-10-CM

## 2022-05-02 DIAGNOSIS — I429 Cardiomyopathy, unspecified: Secondary | ICD-10-CM | POA: Insufficient documentation

## 2022-05-02 DIAGNOSIS — N179 Acute kidney failure, unspecified: Secondary | ICD-10-CM

## 2022-05-02 LAB — BASIC METABOLIC PANEL
Anion gap: 9 (ref 5–15)
BUN: 47 mg/dL — ABNORMAL HIGH (ref 8–23)
CO2: 27 mmol/L (ref 22–32)
Calcium: 7.4 mg/dL — ABNORMAL LOW (ref 8.9–10.3)
Chloride: 108 mmol/L (ref 98–111)
Creatinine, Ser: 1.3 mg/dL — ABNORMAL HIGH (ref 0.61–1.24)
GFR, Estimated: 57 mL/min — ABNORMAL LOW (ref >60)
Glucose, Bld: 143 mg/dL — ABNORMAL HIGH (ref 70–99)
Potassium: 3.6 mmol/L (ref 3.5–5.1)
Sodium: 144 mmol/L (ref 135–145)

## 2022-05-02 LAB — HEPARIN LEVEL (UNFRACTIONATED): Heparin Unfractionated: >1.1 IU/mL — ABNORMAL HIGH (ref 0.30–0.70)

## 2022-05-02 LAB — HEPATIC FUNCTION PANEL
ALT: 972 U/L — ABNORMAL HIGH (ref 0–44)
AST: 434 U/L — ABNORMAL HIGH (ref 15–41)
Albumin: 2.6 g/dL — ABNORMAL LOW (ref 3.5–5.0)
Alkaline Phosphatase: 39 U/L (ref 38–126)
Bilirubin, Direct: 0.3 mg/dL — ABNORMAL HIGH (ref 0.0–0.2)
Indirect Bilirubin: 1 mg/dL — ABNORMAL HIGH (ref 0.3–0.9)
Total Bilirubin: 1.3 mg/dL — ABNORMAL HIGH (ref 0.3–1.2)
Total Protein: 5.1 g/dL — ABNORMAL LOW (ref 6.5–8.1)

## 2022-05-02 LAB — PROCALCITONIN: Procalcitonin: 0.9 ng/mL

## 2022-05-02 LAB — MAGNESIUM
Magnesium: 2.6 mg/dL — ABNORMAL HIGH (ref 1.7–2.4)
Magnesium: 2.5 mg/dL — ABNORMAL HIGH (ref 1.7–2.4)

## 2022-05-02 LAB — CBC
HCT: 46.6 % (ref 39.0–52.0)
Hemoglobin: 14.5 g/dL (ref 13.0–17.0)
MCH: 30 pg (ref 26.0–34.0)
MCHC: 31.1 g/dL (ref 30.0–36.0)
MCV: 96.5 fL (ref 80.0–100.0)
Platelets: 111 10*3/uL — ABNORMAL LOW (ref 150–400)
RBC: 4.83 MIL/uL (ref 4.22–5.81)
RDW: 13.5 % (ref 11.5–15.5)
WBC: 13.5 10*3/uL — ABNORMAL HIGH (ref 4.0–10.5)
nRBC: 0 % (ref 0.0–0.2)

## 2022-05-02 LAB — GLUCOSE, CAPILLARY
Glucose-Capillary: 127 mg/dL — ABNORMAL HIGH (ref 70–99)
Glucose-Capillary: 131 mg/dL — ABNORMAL HIGH (ref 70–99)
Glucose-Capillary: 132 mg/dL — ABNORMAL HIGH (ref 70–99)
Glucose-Capillary: 133 mg/dL — ABNORMAL HIGH (ref 70–99)
Glucose-Capillary: 134 mg/dL — ABNORMAL HIGH (ref 70–99)
Glucose-Capillary: 137 mg/dL — ABNORMAL HIGH (ref 70–99)
Glucose-Capillary: 139 mg/dL — ABNORMAL HIGH (ref 70–99)

## 2022-05-02 LAB — PHOSPHORUS
Phosphorus: 3 mg/dL (ref 2.5–4.6)
Phosphorus: 3 mg/dL (ref 2.5–4.6)

## 2022-05-02 LAB — ACETYLCHOLINE RECEPTOR, BINDING: Acety choline binding ab: 3.84 nmol/L — ABNORMAL HIGH (ref 0.00–0.24)

## 2022-05-02 LAB — APTT
aPTT: 60 seconds — ABNORMAL HIGH (ref 24–36)
aPTT: 49 seconds — ABNORMAL HIGH (ref 24–36)

## 2022-05-02 MED ORDER — NICARDIPINE HCL IN NACL 20-0.86 MG/200ML-% IV SOLN
3.0000 mg/h | INTRAVENOUS | Status: DC
  Administered 2022-05-02: 5 mg/h via INTRAVENOUS
  Filled 2022-05-02 (×2): qty 200

## 2022-05-02 MED ORDER — CALCIUM GLUCONATE-NACL 1-0.675 GM/50ML-% IV SOLN
1.0000 g | Freq: Once | INTRAVENOUS | Status: AC
  Administered 2022-05-02: 1000 mg via INTRAVENOUS
  Filled 2022-05-02: qty 50

## 2022-05-02 MED ORDER — POTASSIUM CHLORIDE 10 MEQ/100ML IV SOLN
10.0000 meq | INTRAVENOUS | Status: AC
  Administered 2022-05-02 (×2): 10 meq via INTRAVENOUS
  Filled 2022-05-02 (×2): qty 100

## 2022-05-02 MED ORDER — MIDAZOLAM HCL 2 MG/2ML IJ SOLN
1.0000 mg | INTRAMUSCULAR | Status: AC | PRN
  Administered 2022-05-02 – 2022-05-03 (×3): 1 mg via INTRAVENOUS
  Filled 2022-05-02 (×2): qty 2

## 2022-05-02 MED ORDER — FUROSEMIDE 10 MG/ML IJ SOLN
40.0000 mg | Freq: Once | INTRAMUSCULAR | Status: AC
  Administered 2022-05-02: 40 mg via INTRAVENOUS
  Filled 2022-05-02: qty 4

## 2022-05-02 MED ORDER — FENTANYL CITRATE PF 50 MCG/ML IJ SOSY
25.0000 ug | PREFILLED_SYRINGE | INTRAMUSCULAR | Status: DC | PRN
  Administered 2022-05-02 (×3): 50 ug via INTRAVENOUS
  Administered 2022-05-03: 100 ug via INTRAVENOUS
  Administered 2022-05-03: 50 ug via INTRAVENOUS
  Administered 2022-05-03 – 2022-05-04 (×4): 100 ug via INTRAVENOUS
  Administered 2022-05-04: 50 ug via INTRAVENOUS
  Filled 2022-05-02 (×3): qty 1

## 2022-05-02 MED ORDER — MIDAZOLAM HCL 2 MG/2ML IJ SOLN
1.0000 mg | INTRAMUSCULAR | Status: DC | PRN
  Administered 2022-05-03: 1 mg via INTRAVENOUS
  Filled 2022-05-02 (×2): qty 2

## 2022-05-02 MED ORDER — FENTANYL CITRATE PF 50 MCG/ML IJ SOSY
25.0000 ug | PREFILLED_SYRINGE | INTRAMUSCULAR | Status: DC | PRN
  Administered 2022-05-04 (×2): 25 ug via INTRAVENOUS
  Filled 2022-05-02: qty 1

## 2022-05-02 MED ORDER — PANTOPRAZOLE SODIUM 40 MG IV SOLR
40.0000 mg | Freq: Every day | INTRAVENOUS | Status: DC
  Administered 2022-05-02 – 2022-05-08 (×7): 40 mg via INTRAVENOUS
  Filled 2022-05-02 (×8): qty 10

## 2022-05-02 MED ORDER — HEPARIN BOLUS VIA INFUSION
2700.0000 [IU] | Freq: Once | INTRAVENOUS | Status: AC
  Administered 2022-05-02: 2700 [IU] via INTRAVENOUS
  Filled 2022-05-02: qty 2700

## 2022-05-02 MED ORDER — POTASSIUM CHLORIDE 20 MEQ PO PACK: 40.0000 meq | PACK | Freq: Once | ORAL | Status: DC

## 2022-05-02 MED ORDER — HEPARIN BOLUS VIA INFUSION
1350.0000 [IU] | Freq: Once | INTRAVENOUS | Status: AC
Start: 2022-05-02 — End: 2022-05-02
  Administered 2022-05-02: 1350 [IU] via INTRAVENOUS
  Filled 2022-05-02: qty 1350

## 2022-05-02 MED ORDER — FENTANYL 2500MCG IN NS 250ML (10MCG/ML) PREMIX INFUSION
0.0000 ug/h | INTRAVENOUS | Status: DC
  Administered 2022-05-02 – 2022-05-03 (×2): 25 ug/h via INTRAVENOUS
  Administered 2022-05-04: 125 ug/h via INTRAVENOUS
  Administered 2022-05-05: 200 ug/h via INTRAVENOUS
  Administered 2022-05-05: 175 ug/h via INTRAVENOUS
  Filled 2022-05-02 (×5): qty 250

## 2022-05-02 MED ORDER — HYDRALAZINE HCL 20 MG/ML IJ SOLN
10.0000 mg | INTRAMUSCULAR | Status: DC | PRN
  Administered 2022-05-02 – 2022-05-03 (×2): 10 mg via INTRAVENOUS
  Administered 2022-05-06: 20 mg via INTRAVENOUS
  Administered 2022-05-06: 10 mg via INTRAVENOUS
  Filled 2022-05-02 (×3): qty 1

## 2022-05-02 MED ORDER — HYDRALAZINE HCL 20 MG/ML IJ SOLN
10.0000 mg | INTRAMUSCULAR | Status: DC
  Filled 2022-05-02: qty 1

## 2022-05-02 NOTE — Progress Notes (Signed)
[place] for IV Heparin Indication: atrial fibrillation  Patient Measurements: Weight: 108.7 kg (239 lb 10.2 oz) Heparin Dosing Weight: 90 kg  Labs: Recent Labs    04/30/22 0332 04/30/22 0546 04/30/22 0550 04/30/22 1734 04/30/22 1955 04/30/22 2109 05/01/22 0431 05/01/22 1050 05/01/22 1210 05/02/22 0502  HGB 16.6  --   --  16.3  --   --  15.3  --   --  14.5  HCT 57.7*  --   --  54.8*  --   --  49.2  --   --  46.6  PLT 189  --   --  146*  --   --  109*  --   --  111*  APTT  --   --    < >  --   --  110* 98*  --  83* 60*  LABPROT 23.0*  --   --   --   --  31.8*  --   --   --   --   INR 2.1*  --   --   --   --  3.1*  --   --   --   --   HEPARINUNFRC  --   --   --   --   --   --   --   --  >1.10* >1.10*  CREATININE  --   --    < > 3.18*  --   --   --  1.90*  --  1.30*  TROPONINIHS 373* 1,137*  --  2,844* 2,210*  --   --   --   --   --    < > = values in this interval not displayed.    Estimated Creatinine Clearance: 57.7 mL/min (A) (by C-G formula based on SCr of 1.3 mg/dL (H)).   Medical History: Past Medical History:  Diagnosis Date   Arthritis    COPD (chronic obstructive pulmonary disease) ([place])    Diabetes mellitus without complication (HCC)    diet controlled   Dyspnea    with activity   Hyperlipidemia    Hypertension    Myasthenia gravis ([place])    Oxygen deficit    Ventricular tachycardia [facility])    Assessment: 76 yo M w/ PMH of atrial fibrillation, COPD, CHF, DM, HLD, HTN presenting to [facility] ED via EMS from home on 04/30/22 in respiratory distress. Prior to admission he was receiving apixaban with last dose unknown. Baseline INR 2.1, H&H/PLT wnl, LFTs elevated  Goal of Therapy:  Heparin level 0.3-0.7 units/ml aPTT 66 - 102 seconds Monitor platelets by anticoagulation protocol: Yes   Results Date/time HL/aPTT Comments 5/21 2109 aPTT 110 Suprather; 1300 un/hr 5/22 0431 aPTT 98 Thera x 1; 1200 un/hr 5/22 1210 aPTT  83 Thera x 2, 1200 un/hr 5/23 0502       aPTT 60/ HL > 1.10    SUBtherapeutic   Plan:  5/23 @ 0502:  aPTT = 60,  HL = > 1.10 aPTT subtherapeutic but HL remains elevated due to Eliquis PTA. Will order heparin 1350 units IV X 1 bolus and increase drip rate to 1400 units/hr.  Will recheck aPTT 8 hrs after rate change. Will recheck HL on 5/24 with AM labs.   Dorthey Depace D  05/02/2022 6:31 AM

## 2022-05-02 NOTE — Progress Notes (Signed)
   05/02/22 1500  Clinical Encounter Type  Visited With Patient and family together  Visit Type Follow-up   Chaplain provided follow up visit to provided support.

## 2022-05-02 NOTE — Progress Notes (Signed)
Pt was transported to MRI then to CT and back to CCU while on the vent.

## 2022-05-02 NOTE — Progress Notes (Signed)
NAME:  Joseph Macdonald, MRN:  [id number], DOB:  1946/11/28, LOS: 2 ADMISSION DATE:  04/30/2022, CONSULTATION DATE:  04/30/22 REFERRING MD:  Dr. Alfred Levins, CHIEF COMPLAINT:  Respiratory distress   History of Present Illness:  76 yo M presenting to [facility] ED via EMS from home on 04/30/22 in respiratory distress. Per documentation patient was found by EMS hypoxic with SpO2 71%-88% placed on NRB with albuterol administered. Family reports the patient was in his normal state of health but had NOT been taking his Lasix prescription. They believe he was taking the rest of his medication as prescribed but did not take his nighttime medications on Saturday. Family denies fever/chills, vomiting, slurred speech, weakness. They did report he had some nausea, one episode of diarrhea and mild nasal congestion as well as a non productive cough. They report he has chronic shortness of breath which started to appear more severe on Saturday.  They confirmed that the patient does not wear oxygen at home. Per report he quit smoking in 2018 after a 50 pack year history, drinks ETOH occasionally and has no recreational drug use history.   ED course: Upon arrival, per EDP report the patient was in severe respiratory distress and "purple". Before they were able to intervene, patient lost pulses. He was not on telemetry yet and it was unclear what was the initial rhythm. Patient received CPR for 8 minutes no defibrillations, and was emergently intubated requiring mechanical ventilatory support. Upon intubation, EDP noted frothy respiratory secretions. ROSC obtained and patient extremely hypertensive with SBP in the 200's. Lasix given and propofol started. SBP subsequently dropped into the 60's, propofol stopped and levophed started. EKG showing some inferolateral STE, Dr. Alfred Levins spoke with STEMI cardiologist Dr. Fletcher Anon who reviewed EKG and determined the patient did not meet STEMI criteria. Medications given: 80 mg Lasix IV,  propofol started then stopped, levophed drip started  Significant labs: (Labs/ Imaging personally reviewed) I, Domingo Pulse [place], AGACNP-BC, personally viewed and interpreted this ECG. EKG Interpretation: Date: 04/30/22, EKG Time: 03:38, Rate: 63, Rhythm: NSR, QRS Axis:  LAD, Intervals: 1st degree HB, ST/T Wave abnormalities: Inferolateral STE, non specific T wave inversions , Narrative Interpretation: NSR with 1st dgree HB & LAD with inferolateral STE that does not meet STEMI criteria per cardiology review Chemistry: pending Hematology: mild leukocytosis WBC: 12.4, Hgb: 16.6,  Troponin: 373, BNP: 971.6, lactic/PCT: pending,  COVID-19 & Influenza A/B: pending ABG: pending CXR 04/30/22: Mild interstitial pulmonary edema  PCCM consulted for admission due to acute hypoxic respiratory failure and subsequent cardiac arrest with circulatory shock requiring mechanical ventilation and vasopressor support.  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 5/22 remains intubated, on vent.  Code Status changed to DNR.  Repeat CTH negative, EEG without evidence of seizures 5/23: Patient withdraws from pain in all extremities with intermittent agitation, plan for MRI Brain & CT Abdomen  Interim History / Subjective:  -No significant events noted overnight -Afebrile, hemodynamically  stable, off pressors, on minimal vent settings -  Neuro exam ~patient withdraws all extremities from pain with intermittent agitation, will not follow commands ~ plan for MRI Brain today -Plan for CT abdomen due to several year history of abdominal pain  -Leukocytosis and procalcitonin improving -Creatinine proved to 1.3 (1.9), urine output 1 L last 24 hours (net  +1.6L since admission) -LFTs improving -Tracheal aspirate comes back with normal respiratory flora  Objective   Blood pressure 93/73, pulse (!) 47, temperature (!) 97.2 F (36.2 C), resp. rate (!) 22, weight 108.7 kg, SpO2 94 %. CVP:  [9 mmHg-23 mmHg] 15 mmHg  Vent Mode: PRVC FiO2 (%):  [22 %-80 %] 22 % Set Rate:  [22 bmp] 22 bmp Vt Set:  [500 mL] 500 mL PEEP:  [8 cmH20] 8 cmH20 Plateau Pressure:  [18 cmH20-19 cmH20] 19 cmH20   Intake/Output Summary (Last 24 hours) at 05/02/2022 0747 Last data filed at 05/02/2022 0700 Gross per 24 hour  Intake 1580.82 ml  Output 975 ml  Net 605.82 ml   Filed Weights   04/30/22 1000 05/01/22 0433 05/02/22 0457  Weight: 108 kg 108.5 kg 108.7 kg    Examination: General: Adult male, critically ill, lying in bed intubated & sedated requiring mechanical ventilation NAD HEENT: MM pale/moist, anicteric, atraumatic, neck supple, orally intubated Neuro: RASS: -1 unable to follow commands, withdraws all extremities to pain, does not follow commands, pupils PERRL +1 sluggish  CV: s1s2 regular, sinus bradycardia on monitor, no r/m/g Pulm: Regular, non labored on AC 100% PEEP 5, breath sounds coarse-BUL & diminished-BLL GI: soft, distended, bs x 4 GU: foley in place with clear yellow urine Skin: limited exam, dusky coloring, scattered petechiae- no rashes/lesions noted Extremities: warm/dry, pulses + 2 R/P, +4 edema noted BLE, generalized +1 edema  Resolv/ed Hospital Problem list     Assessment & Plan:   PEA Cardiac arrest: suspect pulmonary etiology Elevated Troponin in setting of Demand  Ischemia vs. NSTEMI Circulatory shock ~ RESOLVED Acute on Chronic combined systolic and diastolic CHF exacerbation Paroxsymal Atrial Fibrillation PMHx: HTN, HLD, combined CHF, A-fib on Eliquis, HCM 8 minutes downtime, CPR initiated immediately, received no defibrillations - not a candidate for Normothermia Protocol as driving issue appears to be pulmonary with unknown initial rhythm -Continuous cardiac monitoring -Maintain MAP >65 -Vasopressors as needed to maintain MAP goal -Trend lactic acid until normalized (2.7 ~ 2.2 ~ 4.2 ~ 2.8 ~ 2.7 ~ ) -HS Troponin peaked at 2844 -Continue Heparin gtt -Cardiology following, appreciate input -Diuresis as BP and renal function permits -Echocardiogram 5/21: LVEF 35-40%, Grade I DD, RV systolic function moderately reduced    Acute hypoxic respiratory failure suspect secondary to pulmonary edema vs muscle weakness? in the setting of COPD, combined CHF and medication non compliance & myasthenia gravis COPD Former smoker (50 pack year history) PMHx: COPD, combined CHF, MG -Full vent support, implement lung protective strategies -Plateau pressures less than 30 cm H20 -Wean FiO2 & PEEP as tolerated to maintain O2 sats >92% -Follow intermittent Chest X-ray & ABG as needed -Spontaneous Breathing Trials when respiratory parameters met and mental status permits -Implement VAP Bundle -Bronchodilators & Pulmicort nebs -IV steroids (Solu-Medrol 40 mg BID) -ABX as above  Myasthenia Gravis query crisis - f/u thyroid panel, f/u acetylcholine receptor Ab and receptor labs - solu-medrol 40 mg BID - Neurology following to assist with myasthenia gravis management and medication regimen  Acute Kidney Injury in the setting of cardiac arrest ~ IMPROVING Baseline Cr: 1.02, Cr on admission: 2.27 -Monitor I&O's / urinary output -Follow  BMP -Ensure adequate renal perfusion -Avoid nephrotoxic agents as able -Replace electrolytes as indicated  Type 2 Diabetes  [place] Hemoglobin A1C: 6.9 -CBG's q4h; Target range of 140 to 180 -SSI -Follow ICU Hypo/Hyperglycemia protocol  Transaminitis in the setting of cardiac arrest ~ IMPROVING - Trend hepatic function - Consider RUQ [place] if needed - avoid hepatotoxic agents  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 Sedation needs in setting of mechanical ventilation 8 minutes downtime, remains minimally responsive, unclear how long and how significantly hypoxic he was -Maintain a RASS goal of 0 to -1 -Fentanyl and Versed as needed to maintain RASS goal -Avoid sedating medications as able -Daily wake up assessment -CTH negative x2 -EEG 5/22 showed mild to moderate diffuse indicative of global cerebral dysfunction. Epileptiform abnormalities were not seen  -Plan for MRI Brain 5/23 -Neurology following, appreciate input   Best Practice (right click and "Reselect all SmartList Selections" daily)  Diet/type: NPO w/ meds via tube DVT prophylaxis: Heparin gtt GI prophylaxis: PPI Lines: Central line, Arterial Line, and yes and it is still needed Foley:  Yes, and it is still needed Code Status:  DNR Last date of multidisciplinary goals of care discussion [05/02/22]  Patient's daughter updated at bedside 5/23.  All questions answered to her satisfaction  Labs   CBC: Recent Labs  Lab 04/30/22 0332 04/30/22 1734 05/01/22 0431 05/02/22 0502  WBC 12.4* 18.9* 14.6* 13.5*  NEUTROABS 9.4* 15.8*  --   --   HGB 16.6 16.3 15.3 14.5  HCT 57.7* 54.8* 49.2 46.6  MCV 104.3* 100.0 95.5 96.5  PLT 189 146* 109* 111*    Basic Metabolic Panel: Recent Labs  Lab 04/30/22 0333 04/30/22 0550 04/30/22 1734 04/30/22 1955 05/01/22 1050 05/01/22 1647 05/02/22 0502  NA  --  139 141  --  143  --  144  K  --  4.4 4.5  --  3.5  --  3.6  CL  --  104 104  --  107  --  108  CO2  --  25 26  --  26  --  27  GLUCOSE  --  214* 170*  --  151*  --  143*  BUN  --  37* 44*  --  52*  --  47*  CREATININE  --  2.27* 3.18*  --  1.90*   --  1.30*  CALCIUM  --  7.7* 7.4*  --  7.2*  --  7.4*  MG 3.0*  --   --  2.3 2.4 2.4 2.6*  PHOS 6.6*  --   --  4.7* 3.4 3.1 3.0   GFR: Estimated Creatinine Clearance: 57.7 mL/min (A) (by C-G formula based on SCr of 1.3 mg/dL (H)). Recent Labs  Lab 04/30/22 0332 04/30/22 0546 04/30/22 0603 04/30/22 1149 04/30/22 1730 04/30/22 1734 04/30/22 1955 05/01/22 0431 05/02/22 0502  PROCALCITON  --  0.24  --   --   --   --   --  1.49 0.90  WBC 12.4*  --   --   --   --  18.9*  --  14.6* 13.5*  LATICACIDVEN  --   --    < > 2.2* 4.2*  --  2.8* 2.7*  --    < > = values in this interval not displayed.    Liver Function Tests: Recent Labs  Lab 04/30/22 0550 04/30/22 1734  AST 702* 1,750*  ALT 657* 1,349*  ALKPHOS 40 38  BILITOT 1.4* 1.7*  PROT 6.0* 5.9*  ALBUMIN 3.3* 3.1*  No results for input(s): LIPASE, AMYLASE in the last 168 hours. No results for input(s): AMMONIA in the last 168 hours.  ABG    Component Value Date/Time   PHART 7.47 (H) 05/01/2022 0612   PCO2ART 33 05/01/2022 0612   PO2ART 86 05/01/2022 0612   HCO3 24.0 05/01/2022 0612   ACIDBASEDEF 1.9 04/30/2022 0804   O2SAT 98.6 05/01/2022 0612     Coagulation Profile: Recent Labs  Lab 04/30/22 0332 04/30/22 2109  INR 2.1* 3.1*    Cardiac Enzymes: No results for input(s): CKTOTAL, CKMB, CKMBINDEX, TROPONINI in the last 168 hours.  HbA1C: Hemoglobin A1C  Date/Time Value Ref Range Status  12/21/2015 12:00 AM 6.3  Final   Hgb A1c MFr Bld  Date/Time Value Ref Range Status  04/30/2022 05:46 AM 6.9 (H) 4.8 - 5.6 % Final    Comment:    (NOTE) Pre diabetes:          5.7%-6.4%  Diabetes:              >6.4%  Glycemic control for   <7.0% adults with diabetes     CBG: Recent Labs  Lab 05/01/22 0732 05/01/22 1627 05/01/22 1955 05/02/22 0034 05/02/22 0403  GLUCAP 158* 141* 147* 137* 133*    Review of Systems:   Patient intubated and unresponsive, unable to participate in interview  Past Medical  History:  He,  has a past medical history of Arthritis, COPD (chronic obstructive pulmonary disease) ([place]), Diabetes mellitus without complication ([place]), Dyspnea, Hyperlipidemia, Hypertension, Myasthenia gravis ([place]), Oxygen deficit, and Ventricular tachycardia ([place]).   Surgical History:   Past Surgical History:  Procedure Laterality Date   carpel tunnel sx     CATARACT EXTRACTION     right eye   CATARACT EXTRACTION W/PHACO Left 12/01/2020   Procedure: CATARACT EXTRACTION PHACO AND INTRAOCULAR LENS PLACEMENT (IOC) LEFT 10.83 01:14.1 14.6%;  Surgeon: Leandrew Koyanagi, MD;  Location: [facility];  Service: Ophthalmology;  Laterality: Left;   RETINAL DETACHMENT SURGERY     SKIN GRAFT     Behind left knee   TONSILLECTOMY AND ADENOIDECTOMY       Social History:   reports that he quit smoking about 4 years ago. His smoking use included pipe and cigarettes. He has a 20.00 pack-year smoking history. He quit smokeless tobacco use about 4 years ago. He reports current alcohol use. He reports that he does not use drugs.   Family History:  His family history includes Arthritis in his father, mother, and another family member; Heart attack in his father; Heart disease (age of onset: 36) in his father; Hypertension in his mother; Kidney disease in his mother; Stroke in his father; Thyroid disease in his mother.   Allergies No Known Allergies   Home Medications  Prior to Admission medications   Medication Sig Start Date End Date Taking? Authorizing Provider  albuterol (PROVENTIL HFA;VENTOLIN HFA) 108 (90 Base) MCG/ACT inhaler Inhale into the lungs every 6 (six) hours as needed for wheezing or shortness of breath.    [provider]  apixaban (ELIQUIS) 5 MG TABS tablet Take 5 mg by mouth 2 (two) times daily.    [provider]  budesonide-formoterol (SYMBICORT) 160-4.5 MCG/ACT inhaler Inhale 2 puffs into the lungs 2 (two) times daily. 06/16/19   [name], MD   cholecalciferol (VITAMIN D) 25 MCG (1000 UNIT) tablet Take 2,000 Units by mouth daily.    [provider]  empagliflozin (JARDIANCE) 25 MG TABS tablet Take by mouth  daily.    [provider]  famotidine (PEPCID) 20 MG tablet Take 20 mg by mouth as needed for heartburn or indigestion.    [provider]  flecainide (TAMBOCOR) 50 MG tablet Take 50 mg by mouth 2 (two) times daily.    [provider]  HYDROcodone-acetaminophen (NORCO/VICODIN) 5-325 MG tablet Take 1 tablet by mouth every 6 (six) hours as needed for moderate pain. 04/04/22   [name], MD  omeprazole (PRILOSEC) 40 MG capsule Take 40 mg by mouth 2 (two) times daily.     [provider]  pramipexole (MIRAPEX) 0.5 MG tablet Take 1 tablet (0.5 mg total) by mouth at bedtime. 06/04/21   [name], MD  pravastatin (PRAVACHOL) 20 MG tablet Take 20 mg by mouth daily.    [provider]  predniSONE (DELTASONE) 20 MG tablet Take 15 mg by mouth daily with breakfast.    [provider]  pyridostigmine (MESTINON) 60 MG tablet Take 30 mg by mouth 3 (three) times daily.    [provider]  terbinafine (LAMISIL) 250 MG tablet Take 250 mg by mouth daily. 06/03/21   [provider]  tiotropium (SPIRIVA) 18 MCG inhalation capsule Place 18 mcg into inhaler and inhale daily.    [provider]  torsemide (DEMADEX) 20 MG tablet Take 1 tablet (20 mg) by mouth 3 days a week 10/13/21   Deboraha Sprang, MD  vitamin B-12 (CYANOCOBALAMIN) 1000 MCG tablet Take 1,000 mcg by mouth daily.    [provider]     Critical care time: 45 minutes     Darel Hong, AGACNP-BC [facility] [place] epic messenger for cross cover needs If after hours, please call E-link

## 2022-05-02 NOTE — Progress Notes (Addendum)
S: Sedation is being weaned. Patient easily arousable and briskly following commands. MRI brain showed no radiographic e/o anoxic brain injury. rEEG overnight showed mild-to-moderate slowing of continuous background, no epileptiform abnl.  O:   Vitals:   05/02/22 1650 05/02/22 1700  BP:    Pulse: (!) 53 61  Resp: (!) 25 (!) 26  Temp:    SpO2: 98% 95%   Exam on 55mg fentanyl  Gen: lying in bed, easily arousable to physical stimuli Resp: ventilated CV: RRR  Neurologic exam MS: lying in bed, easily arousable to physical stimuli, follows simple commands (squeeze eyes shut, look right, look left, raise arms) Speech: intubated, no attempts to speak CN: pupils 272mER sluggishly reactive to light, (+) corneals/oculocephalics/cough/gag, face symmetric at rest, hearing intact to voice Motor & sensory: anti-gravity in all extremities spontaneously and to command Reflexes: 1+ symm throughout, toes mute bilat  A/P: [age]o man with hx MG now s/p in hospital arrest 36 hrs ago favored to be 2/2 primary cardiac etiology. His MRI showed no definitive e/o anoxic brain injury. EEG shows continuous background with mild-to-moderate slowing. Patient is now tolerating sedation wean and briskly following commands on low dose fentanyl. Prognosis at this point guarded but increasingly favorable. Will continue to follow.  CoSu MonksMD [facility] [phone] 7pm- 7am, please page neurology on call as [place]

## 2022-05-02 NOTE — Progress Notes (Signed)
Progress Note  Patient Name: Joseph Macdonald Date of Encounter: 05/02/2022  [facility] Cardiologist: Julien Nordmann, MD   Subjective   Patient seen on AM rounds. Remains intubated and sedated. Family member at the bedside this morning. Remains sinus brady on the bedside cardiac monitor. Remains off pressors since yesterday afternoon.   Inpatient Medications    Scheduled Meds:  budesonide (PULMICORT) nebulizer solution  0.25 mg Nebulization BID   chlorhexidine gluconate (MEDLINE KIT)  15 mL Mouth Rinse BID   Chlorhexidine Gluconate Cloth  6 each Topical Daily   docusate  100 mg Per Tube BID   feeding supplement (PROSource TF)  45 mL Per Tube TID   feeding supplement (VITAL HIGH PROTEIN)  1,000 mL Per Tube Q24H   free water  30 mL Per Tube Q4H   insulin aspart  0-20 Units Subcutaneous Q4H   ipratropium-albuterol  3 mL Nebulization Q6H   mouth rinse  15 mL Mouth Rinse 10 times per day   pantoprazole (PROTONIX) IV  40 mg Intravenous Daily   polyethylene glycol  17 g Per Tube Daily   Continuous Infusions:  ampicillin-sulbactam (UNASYN) IV Stopped (05/02/22 8565)   fentaNYL infusion INTRAVENOUS 100 mcg/hr (05/02/22 1100)   heparin 1,400 Units/hr (05/02/22 1100)   midazolam 4 mg/hr (05/02/22 1100)   potassium chloride 100 mL/hr at 05/02/22 1100   PRN Meds: atropine, docusate, fentaNYL (SUBLIMAZE) injection, midazolam, polyethylene glycol   Vital Signs    Vitals:   05/02/22 1000 05/02/22 1015 05/02/22 1030 05/02/22 1045  BP:      Pulse: (!) 48 (!) 48 (!) 46 (!) 46  Resp: (!) 22 (!) 22 (!) 22 (!) 22  Temp: (!) 97.3 F (36.3 C) (!) 97.3 F (36.3 C) (!) 97.3 F (36.3 C) (!) 97.3 F (36.3 C)  TempSrc:      SpO2: 94% 94% 94% 94%  Weight:        Intake/Output Summary (Last 24 hours) at 05/02/2022 1134 Last data filed at 05/02/2022 1100 Gross per 24 hour  Intake 1908.73 ml  Output 1050 ml  Net 858.73 ml      05/02/2022    4:57 AM 05/01/2022    4:33 AM 04/30/2022    10:00 AM  Last 3 Weights  Weight (lbs) 239 lb 10.2 oz 239 lb 3.2 oz 238 lb 1.6 oz  Weight (kg) 108.7 kg 108.5 kg 108 kg      Telemetry    Sinus brady with rate 40-50's- Personally Reviewed  ECG    No recent studies have been completed.  Physical Exam   GEN: No acute distress.Currently intubated and sedated on the ventilator.  Neck: No JVD appreciated  Cardiac: RRR, no murmurs, rubs, or gallops.  Respiratory: Clear to auscultation bilaterally, vent assisted, 40% FiO2,  GI: Soft, nontender, non-distended, bowel sounds present MS: No edema; No deformity. Neuro:  Sedated  Psych: Sedated  Labs    High Sensitivity Troponin:   Recent Labs  Lab 04/30/22 0332 04/30/22 0546 04/30/22 1734 04/30/22 1955  TROPONINIHS 373* 1,137* 2,844* 2,210*     Chemistry Recent Labs  Lab 04/30/22 0550 04/30/22 1734 04/30/22 1955 05/01/22 1050 05/01/22 1647 05/02/22 0502  NA 139 141  --  143  --  144  K 4.4 4.5  --  3.5  --  3.6  CL 104 104  --  107  --  108  CO2 25 26  --  26  --  27  GLUCOSE 214* 170*  --  151*  --  143*  BUN 37* 44*  --  52*  --  47*  CREATININE 2.27* 3.18*  --  1.90*  --  1.30*  CALCIUM 7.7* 7.4*  --  7.2*  --  7.4*  MG  --   --    < > 2.4 2.4 2.6*  PROT 6.0* 5.9*  --   --   --  5.1*  ALBUMIN 3.3* 3.1*  --   --   --  2.6*  AST 702* 1,750*  --   --   --  434*  ALT 657* 1,349*  --   --   --  972*  ALKPHOS 40 38  --   --   --  39  BILITOT 1.4* 1.7*  --   --   --  1.3*  GFRNONAA 29* 20*  --  36*  --  57*  ANIONGAP 10 11  --  10  --  9   < > = values in this interval not displayed.    Lipids No results for input(s): CHOL, TRIG, HDL, LABVLDL, LDLCALC, CHOLHDL in the last 168 hours.  Hematology Recent Labs  Lab 04/30/22 1734 05/01/22 0431 05/02/22 0502  WBC 18.9* 14.6* 13.5*  RBC 5.48 5.15 4.83  HGB 16.3 15.3 14.5  HCT 54.8* 49.2 46.6  MCV 100.0 95.5 96.5  MCH 29.7 29.7 30.0  MCHC 29.7* 31.1 31.1  RDW 13.2 13.3 13.5  PLT 146* 109* 111*   Thyroid   Recent Labs  Lab 04/30/22 0546  TSH 0.441  FREET4 0.88    BNP Recent Labs  Lab 04/30/22 0332  BNP 971.6*    DDimer No results for input(s): DDIMER in the last 168 hours.   Radiology    DG Abd 1 View  Result Date: 04/30/2022 CLINICAL DATA:  OG tube placement EXAM: ABDOMEN - 1 VIEW COMPARISON:  04/30/2022 FINDINGS: OG tube tip is in the stomach.  Nonobstructive bowel gas pattern. IMPRESSION: OG tube tip in the stomach. Electronically Signed   By: Rolm Baptise M.D.   On: 04/30/2022 22:12   CT HEAD WO CONTRAST (5MM)  Result Date: 05/01/2022 CLINICAL DATA:  76 year old male status post cardiac arrest and CPR. Altered mental status despite weaning sedation. Query anoxic injury. EXAM: CT HEAD WITHOUT CONTRAST TECHNIQUE: Contiguous axial images were obtained from the base of the skull through the vertex without intravenous contrast. RADIATION DOSE REDUCTION: This exam was performed according to the departmental dose-optimization program which includes automated exposure control, adjustment of the mA and/or kV according to patient size and/or use of iterative reconstruction technique. COMPARISON:  Head CTs 04/30/2022 and 11/29/2019. FINDINGS: Brain: Cerebral volume is stable since 2020. No midline shift, mass effect, or evidence of intracranial mass lesion. No ventriculomegaly No acute intracranial hemorrhage identified. Confluent bilateral cerebral white matter hypodensity, more pronounced in both hemispheres since 2020 although in a similar pattern to that time. Gray-white differentiation at the deep gray nuclei appears preserved, although with some right side thalamic and bilateral basal ganglia heterogeneity. No definite brainstem or cerebellar edema. No cortically based acute infarct identified. Vascular: Calcified atherosclerosis at the skull base. No suspicious intracranial vascular hyperdensity. Skull: No acute osseous abnormality identified. Sinuses/Orbits: Paranasal sinuses, tympanic  cavities and mastoids are well aerated. Other: Partially visible endotracheal tube and oral enteric tube. The enteric tube is looped in the visible oral cavity and pharynx. Postoperative changes to the globes. No acute orbit or scalp soft tissue finding. IMPRESSION: 1. No strong CT evidence  of anoxic cerebral injury at this time. Sensitivity is somewhat reduced by evidence of underlying chronic small vessel disease, which does appear progressed in both hemispheres compared to 2020. Repeat Head CT in another 24-48 hours, or alternatively follow-up Brain MRI, would be valuable. 2. Oral enteric tube looped in the oral cavity and pharynx. Recommend repositioning. Electronically Signed   By: Genevie Ann M.D.   On: 05/01/2022 06:20   EEG adult  Result Date: 05/01/2022 Derek Jack, MD     05/01/2022  9:15 PM Routine EEG Report Joseph Macdonald is a 76 y.o. male with a history of cardiac arrest who is undergoing an EEG to evaluate for seizures. Report: This EEG was acquired with electrodes placed according to the International 10-20 electrode system (including Fp1, Fp2, F3, F4, C3, C4, P3, P4, O1, O2, T3, T4, T5, T6, A1, A2, Fz, Cz, Pz). The following electrodes were missing or displaced: none. The best background was continuous at 5-7 Hz. This activity is reactive to stimulation. No sleep architecture was identified. There was no focal slowing. There were no interictal epileptiform discharges. There were no electrographic seizures identified. Photic stimulation and hyperventilation were not performed. Impression and clinical correlation: This EEG was obtained while sedated on midazolam and fentanyl and was abnormal due to mild-to-moderate diffuse slowing indicative of global cerebral dysfunction. Epileptiform abnormalities were not seen during this recording. Su Monks, MD [facility] [phone] If 7pm- 7am, please page neurology on call as listed in [place].    Cardiac Studies  Echocardiogram  completed on 04/30/2022 1. Left ventricular ejection fraction, by estimation, is 35 to 40%. The  left ventricle has moderately decreased function. The left ventricle  demonstrates global hypokinesis. There is mild left ventricular  hypertrophy. Left ventricular diastolic  parameters are consistent with Grade I diastolic dysfunction (impaired  relaxation).   2. Right ventricular systolic function is moderately reduced. The right  ventricular size is mildly enlarged.   3. The mitral valve is normal in structure. No evidence of mitral valve  regurgitation.   4. The aortic valve is grossly normal. Aortic valve regurgitation is not  visualized.   Patient Profile     76 y.o. male with history of HFpEF, essential hypertension, VT, PAF, hyperlipidemia, COPD, myasthenia gravis who presented to the hospital due to worsening shortness of breath and severe respiratory distress. He then developed PEA arrest upon presentation with 8 minutes of CPR. Of note there was no shockable rhythm.   Assessment & Plan    PEA cardiac arrest likely due to pulmonary etiology - HS troponins slightly elevated without evidence of acute coronary syndrome - Echocardiogram completed with drop in EF noted likely from myocardial stunning, can revisit repeating limited study at later date - Continue with supportive care of ventilator and pressors per PCCM - Continue cardiac monitor - EKG PRN - Going for MRI today  2. Acute on chronic systolic congestive heart failure  - LVEF dropped with 35% and previously was 45-50% - If blood pressure and kidneys permit can add IV lasix, net balance is +694 in the last 24 hours - GDMT unable to be ordered at this time due to previous hypotension requiring levophed and elevated creatinine which is improving - If patient continues to improve consider R/LHC - Daily weight, strict I & O, low sodium diet  3. Paroxysmal A-fib - Currently in Sinus - Continue heparin drip for now -  Flecainide was discontinued due to drop in EF and worsened renal function  For questions or updates, please contact [place] Please consult [email or web address] for contact info under        Signed, Malie Kashani, NP  05/02/2022, 11:34 AM

## 2022-05-02 NOTE — Consult Note (Addendum)
PHARMACY CONSULT NOTE  Pharmacy Consult for Electrolyte Monitoring and Replacement   Recent Labs: Potassium (mmol/L)  Date Value  05/02/2022 3.6   Magnesium (mg/dL)  Date Value  05/02/2022 2.6 (H)   Calcium (mg/dL)  Date Value  05/02/2022 7.4 (L)   Albumin (g/dL)  Date Value  04/30/2022 3.1 (L)  04/03/2022 4.1   Phosphorus (mg/dL)  Date Value  05/02/2022 3.0   Sodium (mmol/L)  Date Value  05/02/2022 144  04/03/2022 149 (H)   Assessment: Patient is a 76 y/o M with medical history including Afib on Eliquis, COPD, combined systolic and diastolic CHF, V2ZD, HLD, HTN, myasthenia gravis, ventricular tachycardia who presented to the ED 5/21 early AM with chest pain and respiratory distress. Patient experienced cardiac arrest with attainment of ROSC after 8 minutes of CPR. Patient is currently intubated, sedated, and on mechanical ventilation in the ICU. Pharmacy consulted to assist with electrolyte monitoring and replacement as indicated.  Nutrition: Tube feeds initiated 5/22 (on hold)  AKI resolving  Goal of Therapy:  Electrolytes within normal limits  Plan:  --K 3.6, Kcl 40 mEq per tube x 1 dose --Ca 7.4, last albumin was 3.1 on 5/21. Give IV calcium gluconate 1 g x 1 dose given ongoing issues with bradycardia. Consider additional dose later today if clinically indicated --Follow-up electrolytes with AM labs tomorrow --Avoid IV magnesium if possible given history of myasthenia gravis  Addendum: Per discussion with RN, patient not tolerating anything down OG tube (emesis). Patient is NPO pending CT abdomen / pelvis. Will give IV Kcl 10 mEq x 2 doses in place of Kcl 40 mEq per tube  [name] 05/02/2022 8:26 AM

## 2022-05-02 NOTE — Progress Notes (Signed)
[place] for IV Heparin Indication: atrial fibrillation  Patient Measurements: Weight: 108.7 kg (239 lb 10.2 oz) Heparin Dosing Weight: 90 kg  Labs: Recent Labs    04/30/22 0332 04/30/22 0546 04/30/22 0550 04/30/22 1734 04/30/22 1955 04/30/22 2109 05/01/22 0431 05/01/22 1050 05/01/22 1210 05/02/22 0502 05/02/22 1519  HGB 16.6  --   --  16.3  --   --  15.3  --   --  14.5  --   HCT 57.7*  --   --  54.8*  --   --  49.2  --   --  46.6  --   PLT 189  --   --  146*  --   --  109*  --   --  111*  --   APTT  --   --    < >  --   --  110* 98*  --  83* 60* 49*  LABPROT 23.0*  --   --   --   --  31.8*  --   --   --   --   --   INR 2.1*  --   --   --   --  3.1*  --   --   --   --   --   HEPARINUNFRC  --   --   --   --   --   --   --   --  >1.10* >1.10*  --   CREATININE  --   --    < > 3.18*  --   --   --  1.90*  --  1.30*  --   TROPONINIHS 373* 1,137*  --  2,844* 2,210*  --   --   --   --   --   --    < > = values in this interval not displayed.    Estimated Creatinine Clearance: 57.7 mL/min (A) (by C-G formula based on SCr of 1.3 mg/dL (H)).   Medical History: Past Medical History:  Diagnosis Date   Arthritis    COPD (chronic obstructive pulmonary disease) ([place])    Diabetes mellitus without complication (HCC)    diet controlled   Dyspnea    with activity   Hyperlipidemia    Hypertension    Myasthenia gravis ([place])    Oxygen deficit    Ventricular tachycardia [facility])    Assessment: 76 yo M w/ PMH of atrial fibrillation, COPD, CHF, DM, HLD, HTN presenting to [facility] ED via EMS from home on 04/30/22 in respiratory distress. Prior to admission he was receiving apixaban with last dose unknown. Baseline INR 2.1, H&H/PLT wnl, LFTs elevated  Goal of Therapy:  Heparin level 0.3-0.7 units/ml aPTT 66 - 102 seconds Monitor platelets by anticoagulation protocol: Yes   Results Date/time HL/aPTT Comments 5/21 2109 aPTT 110 Suprather; 1300 un/hr 5/22  0431 aPTT 98 Thera x 1; 1200 un/hr 5/22 1210 aPTT 83 Thera x 2, 1200 un/hr 5/23 0502       aPTT 60/ HL > 1.10    SUBtherapeutic  5/23 1519 aPTT 48 Subthera, 1400 > 1650  Plan:  5/23 1519 aPTT 48 Subthera, aPTT subtherapeutic and lower than previous rate. Verified with RN if any undocumented interruptions in heparin therapy -- only ~ 10 min pause during MRI.  Will order heparin 2700 units IV X 1 bolus and increase drip rate to 1650 units/hr.  Will recheck aPTT/HL 8 hrs after rate change.    Lurlene Ronda E  Sherlean Foot, PharmD, BCPS Clinical Pharmacist   05/02/2022 3:41 PM

## 2022-05-02 NOTE — Consult Note (Cosign Needed Addendum)
Palliative Care Consult Note                                  Date: 05/02/2022   Patient Name: Joseph Macdonald  DOB: 02/18/1946  MRN: [id number]  Age / Sex: 76 y.o., male  PCP: [name], MD Referring Physician: Flora Lipps, MD  Reason for Consultation: Establishing goals of care  HPI/Patient Profile: 75 y.o. male  with past medical history of A-fib on Eliquis, COPD, combined systolic and diastolic CHF, N4BS, HLD, HTN, myasthenia gravis, V. tach.  He presented to the [facility] emergency department in severe respiratory distress and "purple" and before placed on telemetry lost pulses.  He received CPR for 8 minutes with no defibrillations and was emergently intubated for mechanical ventilatory support.  He was admitted on 04/30/2022 with PEA arrest (suspected pulmonary etiology), circulatory shock, acute on chronic combined systolic and diastolic CHF exacerbation, acute hypoxic respiratory failure, AKI in the setting of cardiac arrest, and others.   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  PMT was consulted for [place] conversations.  Past Medical History:  Diagnosis Date   Arthritis    COPD (chronic obstructive pulmonary disease) ([place])    Diabetes mellitus without complication (HCC)    diet controlled   Dyspnea    with activity   Hyperlipidemia    Hypertension    Myasthenia gravis ([place])    Oxygen deficit    Ventricular tachycardia (HCC)     Subjective:   This NP [name] reviewed medical records, received report from team, assessed the patient and then meet at the patient's bedside to discuss diagnosis, prognosis, GOC, EOL wishes disposition and options.  I met with the patient at the bedside who is unable to meaningfully interact.  I met with his adopted daughter at the bedside and shortly thereafter his wife at the bedside.   Concept of  Palliative Care was introduced as specialized medical care for people and their families living with serious illness.  If focuses on providing relief from the symptoms and stress of a serious illness.  The goal is to improve quality of life for both the patient and the family. Values and goals of care important to patient and family were attempted to be elicited.  Created space and opportunity for patient  and family to explore thoughts and feelings regarding current medical situation   Natural trajectory and current clinical status were discussed. Questions and concerns addressed. Patient  encouraged to call with questions or concerns.    Patient/Family Understanding of Illness: Per the patient's daughter she understands this is "touching go" and that there is ongoing testing to evaluate his brain function.  She describes that he would not want a worse quality life and he already has and that neither he nor his family would want that for him.  I had extensive discussion with both present family members about anoxic encephalopathy after CPR, different levels of brain function that may suffer damage, planned testing including MRI of the brain, findings of EEG showing mild to moderate global swelling without seizures.  They understand he is in quite a serious and critical condition.  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  He did suffer injuries related to this but physical and mental.  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  After retirement he and his wife enjoyed RV travel, visiting family.  They also at one point on the condo [place] the beach and was spent half the time there.  However, the travel and the condo use slowed down and stopped due to declining health issues.  The patient and his wife [name] has been married for 48 years.  She describes their enjoyment of cruising and  admitted multiple trips (approximately 10) around various [place] locations.  Patient Values: Quality of life, family, independence.  His daughter and wife both spoke with his love of chocolate chip cookies and Oreos as well.  He is described as a religious person of faith, has been seen by the chaplain thus far.  He identifies as Patent examiner.  Goals: Get more data on his current situation, make decisions as needed that best on him and his wishes  Today's Discussion: In addition to the extensive discussions described above related to current clinical status and life review, we had further extensive discussion on various topics.  We discussed how the patient seems to be suffering from depression and PTSD, although he did not like talking about this.  We discussed the generational differences in dealing with mental health and his generation demand was expected to be "toxic and not emotional".  They also describe some abdominal pain at home, and indicate there looking into this currently as well.  The patient's wife is his third marriage and his wife has 2 children Vicente Serene and Coralyn Helling from a previous marriage.  However, the patient adopted both of these children in addition to his biological son from a previous marriage (Joe).  They shared that Wille Glaser has not been as involved in the remote past but seems to be more involved lately.  He also takes after his dad and not showing emotion is much which is sometimes misconstrued as not caring by other family members.  However lately they have come to learn how much he deeply cares for his father.  We discussed various family dynamics and how these can complicate acute health crises.  Both his daughter and wife share that the patient does not tell much about his health, does not like going to the doctor or the hospital.  They both share that he has been unhappy with his quality of life and definitely would not want any worse quality of life.  His daughter  states that he has told his wife that he is "suffering for years, I am ready to go."  His wife describes him as previously very active and recently limited in his physical functioning because of his health declines and not enjoying life.  Limits are frustrating to him.  He is expressed that he feels useless and worthless.  They both agree that he does not have a great quality of life, is not enjoying his life, and for sure would not want a worsening quality of life.  I shared how these are important things to consider as we need to make decisions.  I shared that it is important to keep the patient and his wishes at the center of the conversation.  They both agreed.  I provided emotional and general support through therapeutic listening, empathy, sharing stories, and other techniques.  Answered all questions and addressed all concerns to the best of my ability.  Review of Systems  Unable to perform ROS: Intubated   Objective:  Primary Diagnoses: Present on Admission:  Cardiac arrest [facility])   Physical Exam Vitals and nursing note reviewed.  Constitutional:      General: He is not in acute distress.    Appearance: He is obese. He is ill-appearing.     Interventions: He is sedated and intubated.  HENT:     Head: Normocephalic and atraumatic.  Cardiovascular:     Rate and Rhythm: Bradycardia present.  Pulmonary:     Effort: No respiratory distress. He is intubated.     Breath sounds: Rales present.  Abdominal:     General: Abdomen is protuberant.     Palpations: Abdomen is soft.  Skin:    General: Skin is warm and dry.  Neurological:     Mental Status: He is unresponsive.    Vital Signs:  BP 93/73   Pulse (!) 44   Temp (!) 97.3 F (36.3 C) (Bladder)   Resp (!) 22   Wt 108.7 kg   SpO2 94%   BMI 37.53 kg/m   Palliative Assessment/Data: 10% (intub/vent with OG)    Advanced Care Planning:   Primary Decision Maker: NEXT OF KIN  Code Status/Advance Care  Planning: DNR  A discussion was had today regarding advanced directives. Concepts specific to code status, artifical feeding and hydration, continued IV antibiotics and rehospitalization was had.  The difference between a aggressive medical intervention path and a palliative comfort care path for this patient at this time was had.  Decisions/Changes to ACP: None today  Assessment & Plan:   Impression: 76 year old male with multiple chronic comorbidities presents with acute respiratory distress, chest pain, and subsequent cardiac arrest.  ROSC returned after 8 minutes of CPR.  Remains intubated and sedated on mechanical ventilator.  Does not seem to have much interaction even on wake up assessment.  His echocardiogram also shows significant worsening over the past month, query cardiac etiology such as cardiac ischemia.  His family is aware that he is acutely ill and very critical.  They have elected to make him a DNR.  They share that overall he has not had a great quality of life as of late and he would not want a worsening quality of life.  Ongoing planned work-up including MRI of the brain to provide information on suspected anoxic brain injury.  Depending on these results, anticipate further discussions on goals of care.  Overall prognosis is poor.  SUMMARY OF RECOMMENDATIONS   Remain DNR Planned MRI of the brain today Continue to treat the treatable otherwise for now Further discussions after results of neurological studies (MRI and EEG) Continued spiritual care Continued emotional support of the patient and family PMT will continue to follow  Symptom Management:  Per primary team PMT is available to assist as needed, especially if the patient transitions to comfort care at some point  Prognosis:  Unable to determine  Discharge Planning:  To Be Determined   Discussed with: Medical team, nursing team, patient's family    Thank you for allowing [place] to participate in the care of  ZAEVION PARKE PMT will continue to support holistically.  Time Total: 110 min  Greater than 50%  of this time was spent counseling and coordinating care related to the above assessment and plan.  Signed by: [name], NP Palliative Medicine Team  Team Phone # [phone] (Nights/Weekends)  05/02/2022, 12:38 PM

## 2022-05-03 ENCOUNTER — Inpatient Hospital Stay: Payer: PPO

## 2022-05-03 DIAGNOSIS — I468 Cardiac arrest due to other underlying condition: Secondary | ICD-10-CM | POA: Diagnosis not present

## 2022-05-03 DIAGNOSIS — I469 Cardiac arrest, cause unspecified: Secondary | ICD-10-CM | POA: Diagnosis not present

## 2022-05-03 DIAGNOSIS — J810 Acute pulmonary edema: Secondary | ICD-10-CM

## 2022-05-03 DIAGNOSIS — G7000 Myasthenia gravis without (acute) exacerbation: Secondary | ICD-10-CM

## 2022-05-03 DIAGNOSIS — J989 Respiratory disorder, unspecified: Secondary | ICD-10-CM | POA: Diagnosis not present

## 2022-05-03 DIAGNOSIS — I5020 Unspecified systolic (congestive) heart failure: Secondary | ICD-10-CM | POA: Diagnosis not present

## 2022-05-03 DIAGNOSIS — I480 Paroxysmal atrial fibrillation: Secondary | ICD-10-CM | POA: Diagnosis not present

## 2022-05-03 DIAGNOSIS — Z515 Encounter for palliative care: Secondary | ICD-10-CM | POA: Diagnosis not present

## 2022-05-03 DIAGNOSIS — Z7189 Other specified counseling: Secondary | ICD-10-CM | POA: Diagnosis not present

## 2022-05-03 LAB — CBC
HCT: 47.9 % (ref 39.0–52.0)
Hemoglobin: 14.8 g/dL (ref 13.0–17.0)
MCH: 29.8 pg (ref 26.0–34.0)
MCHC: 30.9 g/dL (ref 30.0–36.0)
MCV: 96.4 fL (ref 80.0–100.0)
Platelets: 90 10*3/uL — ABNORMAL LOW (ref 150–400)
RBC: 4.97 MIL/uL (ref 4.22–5.81)
RDW: 13.8 % (ref 11.5–15.5)
WBC: 13.3 10*3/uL — ABNORMAL HIGH (ref 4.0–10.5)
nRBC: 0 % (ref 0.0–0.2)

## 2022-05-03 LAB — CULTURE, RESPIRATORY W GRAM STAIN
Culture: NORMAL
Special Requests: NORMAL

## 2022-05-03 LAB — HEPARIN LEVEL (UNFRACTIONATED)
Heparin Unfractionated: 1.02 IU/mL — ABNORMAL HIGH (ref 0.30–0.70)
Heparin Unfractionated: >1.1 IU/mL — ABNORMAL HIGH (ref 0.30–0.70)

## 2022-05-03 LAB — HEPATIC FUNCTION PANEL
ALT: 1002 U/L — ABNORMAL HIGH (ref 0–44)
AST: 336 U/L — ABNORMAL HIGH (ref 15–41)
Albumin: 2.9 g/dL — ABNORMAL LOW (ref 3.5–5.0)
Alkaline Phosphatase: 34 U/L — ABNORMAL LOW (ref 38–126)
Bilirubin, Direct: 0.4 mg/dL — ABNORMAL HIGH (ref 0.0–0.2)
Indirect Bilirubin: 0.9 mg/dL (ref 0.3–0.9)
Total Bilirubin: 1.3 mg/dL — ABNORMAL HIGH (ref 0.3–1.2)
Total Protein: 5.7 g/dL — ABNORMAL LOW (ref 6.5–8.1)

## 2022-05-03 LAB — BLOOD GAS, ARTERIAL
Acid-Base Excess: 5.1 mmol/L — ABNORMAL HIGH (ref 0.0–2.0)
Bicarbonate: 30.5 mmol/L — ABNORMAL HIGH (ref 20.0–28.0)
FIO2: 100 %
MECHVT: 600 mL
Mechanical Rate: 20
O2 Saturation: 95 %
PEEP: 10 cmH2O
Patient temperature: 37
pCO2 arterial: 47 mmHg (ref 32–48)
pH, Arterial: 7.42 (ref 7.35–7.45)
pO2, Arterial: 69 mmHg — ABNORMAL LOW (ref 83–108)

## 2022-05-03 LAB — BASIC METABOLIC PANEL
Anion gap: 6 (ref 5–15)
BUN: 39 mg/dL — ABNORMAL HIGH (ref 8–23)
CO2: 32 mmol/L (ref 22–32)
Calcium: 7.6 mg/dL — ABNORMAL LOW (ref 8.9–10.3)
Chloride: 110 mmol/L (ref 98–111)
Creatinine, Ser: 1.29 mg/dL — ABNORMAL HIGH (ref 0.61–1.24)
GFR, Estimated: 58 mL/min — ABNORMAL LOW (ref >60)
Glucose, Bld: 128 mg/dL — ABNORMAL HIGH (ref 70–99)
Potassium: 3.6 mmol/L (ref 3.5–5.1)
Sodium: 148 mmol/L — ABNORMAL HIGH (ref 135–145)

## 2022-05-03 LAB — APTT
aPTT: 75 seconds — ABNORMAL HIGH (ref 24–36)
aPTT: 87 seconds — ABNORMAL HIGH (ref 24–36)

## 2022-05-03 LAB — GLUCOSE, CAPILLARY
Glucose-Capillary: 121 mg/dL — ABNORMAL HIGH (ref 70–99)
Glucose-Capillary: 126 mg/dL — ABNORMAL HIGH (ref 70–99)
Glucose-Capillary: 137 mg/dL — ABNORMAL HIGH (ref 70–99)
Glucose-Capillary: 111 mg/dL — ABNORMAL HIGH (ref 70–99)
Glucose-Capillary: 110 mg/dL — ABNORMAL HIGH (ref 70–99)
Glucose-Capillary: 123 mg/dL — ABNORMAL HIGH (ref 70–99)

## 2022-05-03 MED ORDER — MIDAZOLAM HCL 2 MG/2ML IJ SOLN
4.0000 mg | Freq: Once | INTRAMUSCULAR | Status: AC
  Administered 2022-05-03: 4 mg via INTRAVENOUS
  Filled 2022-05-03: qty 4

## 2022-05-03 MED ORDER — ACETAZOLAMIDE SODIUM 500 MG IJ SOLR
500.0000 mg | Freq: Once | INTRAMUSCULAR | Status: AC
  Administered 2022-05-03: 500 mg via INTRAVENOUS
  Filled 2022-05-03: qty 500

## 2022-05-03 MED ORDER — MIDAZOLAM-SODIUM CHLORIDE 100-0.9 MG/100ML-% IV SOLN: 0.5000 mg/h | INTRAVENOUS | Status: DC

## 2022-05-03 MED ORDER — LORAZEPAM 2 MG/ML IJ SOLN
4.0000 mg | Freq: Once | INTRAMUSCULAR | Status: AC
  Administered 2022-05-03: 4 mg via INTRAVENOUS
  Filled 2022-05-03: qty 2

## 2022-05-03 MED ORDER — ETOMIDATE 2 MG/ML IV SOLN
20.0000 mg | Freq: Once | INTRAVENOUS | Status: AC
  Administered 2022-05-03: 20 mg via INTRAVENOUS
  Filled 2022-05-03: qty 10

## 2022-05-03 MED ORDER — FENTANYL CITRATE (PF) 100 MCG/2ML IJ SOLN
100.0000 ug | Freq: Once | INTRAMUSCULAR | Status: AC
  Administered 2022-05-03: 100 ug via INTRAVENOUS
  Filled 2022-05-03: qty 2

## 2022-05-03 MED ORDER — POTASSIUM CHLORIDE 10 MEQ/100ML IV SOLN
10.0000 meq | INTRAVENOUS | Status: AC
  Administered 2022-05-03 (×2): 10 meq via INTRAVENOUS
  Filled 2022-05-03 (×2): qty 100

## 2022-05-03 MED ORDER — MIDAZOLAM HCL 2 MG/2ML IJ SOLN
2.0000 mg | INTRAMUSCULAR | Status: DC | PRN
  Administered 2022-05-03 – 2022-05-05 (×7): 2 mg via INTRAVENOUS
  Filled 2022-05-03 (×7): qty 2

## 2022-05-03 MED ORDER — ROCURONIUM BROMIDE 10 MG/ML (PF) SYRINGE
90.0000 mg | PREFILLED_SYRINGE | Freq: Once | INTRAVENOUS | Status: AC
  Administered 2022-05-03: 90 mg via INTRAVENOUS
  Filled 2022-05-03: qty 9
  Filled 2022-05-03: qty 10

## 2022-05-03 MED ORDER — CLONIDINE HCL 0.1 MG/24HR TD PTWK
0.1000 mg | MEDICATED_PATCH | TRANSDERMAL | Status: DC
Start: 2022-05-03 — End: 2022-05-04
  Administered 2022-05-03: 0.1 mg via TRANSDERMAL
  Filled 2022-05-03: qty 1

## 2022-05-03 MED ORDER — FUROSEMIDE 10 MG/ML IJ SOLN
40.0000 mg | Freq: Two times a day (BID) | INTRAMUSCULAR | Status: DC
Start: 2022-05-03 — End: 2022-05-05
  Administered 2022-05-03 – 2022-05-05 (×5): 40 mg via INTRAVENOUS
  Filled 2022-05-03 (×5): qty 4

## 2022-05-03 NOTE — Procedures (Signed)
Extubation Procedure Note  Patient Details:   Name: Joseph Macdonald DOB: 1946-03-05 MRN: [id number]   Vent end date: 05/03/22 Vent end time: 0936   Evaluation  O2 sats: currently acceptable Complications: No apparent complications Patient did tolerate procedure well. Bilateral Breath Sounds: Diminished   Patient extubated per order.  Trialed nasal cannula, but increased to HFNC due to desaturation.  Stable at this time, but will continue to monitor.  [name] [name] 05/03/2022, 1:39 PM

## 2022-05-03 NOTE — TOC Initial Note (Signed)
Transition of Care [facility]) - Initial/Assessment Note    Patient Details  Name: Joseph Macdonald MRN: [id number] Date of Birth: 08-27-1946  Transition of Care [facility]) CM/SW Contact:    Shelbie Hutching, RN Phone Number: 05/03/2022, 11:07 AM  Clinical Narrative:                 Patient admitted to the hospital for acute pulmonary edema resulting in cardiac arrest.  Patient was admitted to the ICU intubated and sedated.  Patient was extubated this morning.  Patient is still pretty lethargic, placed on HFNC at 55%. Patient is from home with his wife.  TOC will follow patient progress.   Expected Discharge Plan:  (TBD) Barriers to Discharge: Continued Medical Work up   Patient Goals and CMS Choice Patient states their goals for this hospitalization and ongoing recovery are:: unable to state just extubated this morning      Expected Discharge Plan and Services Expected Discharge Plan:  (TBD)       Living arrangements for the past 2 months: Single Family Home                                      Prior Living Arrangements/Services Living arrangements for the past 2 months: Single Family Home Lives with:: Spouse Patient language and need for interpreter reviewed:: Yes        Need for Family Participation in Patient Care: Yes (Comment) Care giver support system in place?: Yes (comment)   Criminal Activity/Legal Involvement Pertinent to Current Situation/Hospitalization: No - Comment as needed  Activities of Daily Living      Permission Sought/Granted                  Emotional Assessment Appearance:: Appears stated age Attitude/Demeanor/Rapport: Lethargic Affect (typically observed): Unable to Assess   Alcohol / Substance Use: Not Applicable Psych Involvement: No (comment)  Admission diagnosis:  Cardiac arrest ([place]) [I46.9] Acute pulmonary edema ([place]) [J81.0] Cardiac arrest due to respiratory disorder ([place]) [J98.9, I46.8] Patient Active Problem List    Diagnosis Date Noted   Cardiomyopathy ([place])    Cardiac arrest ([place]) 04/30/2022   Paroxysmal atrial fibrillation (HCC)    HFrEF (heart failure with reduced ejection fraction) ([place])    History of pulmonary embolism 07/02/2020   Vitamin D deficiency 06/17/2019   Acute on chronic kidney failure ([place]) 06/11/2019   Shortness of breath 04/11/2019   Epigastric pain 04/11/2019   Hypokalemia 04/11/2019   Ventricular tachycardia ([place]) 03/12/2019   Chronic heart failure with preserved ejection fraction (HFpEF) ([place]) 03/12/2019   Chest pain 03/12/2019   COPD (chronic obstructive pulmonary disease) ([place]) 01/13/2019   Pre-diabetes 03/19/2017   Chronic narcotic use 01/17/2016   Myasthenia gravis ([place]) 09/17/2015   Anxiety 08/17/2015   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/17/2015   Narrowing of intervertebral disc space 08/17/2015   Clinical depression 08/17/2015   Acid reflux 08/17/2015   Hemorrhoid 08/17/2015   Essential hypertension 08/17/2015   Bad memory 08/17/2015   Neurosis, posttraumatic 08/17/2015   Allergic rhinitis, seasonal 08/17/2015   Smoking greater than 30 pack years 08/17/2015   Restless leg syndrome 08/17/2015   PCP:  [name], MD Pharmacy:   [address] [id number] [address], [place] [address] Phone: [phone] Fax: [place], [place]  [place] [address] Phone: [phone] Fax: [phone]     Social Determinants of Health (SDOH) Interventions    Readmission Risk Interventions     View : No data to display.

## 2022-05-03 NOTE — Progress Notes (Signed)
[place] for IV Heparin Indication: atrial fibrillation  Patient Measurements: Weight: 108.7 kg (239 lb 10.2 oz) Heparin Dosing Weight: 90 kg  Labs: Recent Labs    04/30/22 1734 04/30/22 1955 04/30/22 2109 05/01/22 0431 05/01/22 1050 05/01/22 1210 05/02/22 0502 05/02/22 1519 05/02/22 2341 05/03/22 0334 05/03/22 0830  HGB 16.3  --   --  15.3  --   --  14.5  --   --  14.8  --   HCT 54.8*  --   --  49.2  --   --  46.6  --   --  47.9  --   PLT 146*  --   --  109*  --   --  111*  --   --  90*  --   APTT  --   --  110* 98*  --    < > 60* 49* 75*  --  87*  LABPROT  --   --  31.8*  --   --   --   --   --   --   --   --   INR  --   --  3.1*  --   --   --   --   --   --   --   --   HEPARINUNFRC  --   --   --   --   --    < > >1.10*  --  >1.10*  --  1.02*  CREATININE 3.18*  --   --   --  1.90*  --  1.30*  --   --  1.29*  --   TROPONINIHS 2,844* 2,210*  --   --   --   --   --   --   --   --   --    < > = values in this interval not displayed.    Estimated Creatinine Clearance: 58.2 mL/min (A) (by C-G formula based on SCr of 1.29 mg/dL (H)).   Medical History: Past Medical History:  Diagnosis Date   Arthritis    COPD (chronic obstructive pulmonary disease) ([place])    Diabetes mellitus without complication (HCC)    diet controlled   Dyspnea    with activity   Hyperlipidemia    Hypertension    Myasthenia gravis ([place])    Oxygen deficit    Ventricular tachycardia [facility])    Assessment: 76 yo M w/ PMH of atrial fibrillation, COPD, CHF, DM, HLD, HTN presenting to [facility] ED via EMS from home on 04/30/22 in respiratory distress. Prior to admission he was receiving apixaban with last dose unknown. Baseline INR 2.1, H&H/PLT wnl, LFTs elevated  Goal of Therapy:  Heparin level 0.3-0.7 units/ml aPTT 66 - 102 seconds Monitor platelets by anticoagulation protocol: Yes   Results Date/time HL/aPTT Comments 5/21 2109 aPTT 110 Suprather; 1300 un/hr 5/22  0431 aPTT 98 Thera x 1; 1200 un/hr 5/22 1210 aPTT 83 Thera x 2, 1200 un/hr 5/23 0502       aPTT 60     Subthera; 1200 un/hr  5/23 1519 aPTT 48 Subthera, 1400 un/hr 5/23 2341       aPTT 75 Thera x 1; 1650 un/hr 5/24 0830 aPTT 87 Thera x 2; 1650 un/hr  Plan:  --aPTT is therapeutic x 2 --Maintain heparin infusion at 1650 units/hr --Re-check aPTT / HL tomorrow AM; correlation still not established. Switch over to HL monitoring when correlation is proven --Daily CBC per protocol while on  IV heparin  [name] 05/03/2022 9:17 AM

## 2022-05-03 NOTE — Progress Notes (Signed)
NAME:  Joseph Macdonald, MRN:  [id number], DOB:  19-Jun-1946, LOS: 3 ADMISSION DATE:  04/30/2022, CONSULTATION DATE:  04/30/22 REFERRING MD:  Dr. Alfred Levins, CHIEF COMPLAINT:  Respiratory distress   History of Present Illness:  76 yo M presenting to [facility] ED via EMS from home on 04/30/22 in respiratory distress. Per documentation patient was found by EMS hypoxic with SpO2 71%-88% placed on NRB with albuterol administered. Family reports the patient was in his normal state of health but had NOT been taking his Lasix prescription. They believe he was taking the rest of his medication as prescribed but did not take his nighttime medications on Saturday. Family denies fever/chills, vomiting, slurred speech, weakness. They did report he had some nausea, one episode of diarrhea and mild nasal congestion as well as a non productive cough. They report he has chronic shortness of breath which started to appear more severe on Saturday.  They confirmed that the patient does not wear oxygen at home. Per report he quit smoking in 2018 after a 50 pack year history, drinks ETOH occasionally and has no recreational drug use history.   ED course: Upon arrival, per EDP report the patient was in severe respiratory distress and "purple". Before they were able to intervene, patient lost pulses. He was not on telemetry yet and it was unclear what was the initial rhythm. Patient received CPR for 8 minutes no defibrillations, and was emergently intubated requiring mechanical ventilatory support. Upon intubation, EDP noted frothy respiratory secretions. ROSC obtained and patient extremely hypertensive with SBP in the 200's. Lasix given and propofol started. SBP subsequently dropped into the 60's, propofol stopped and levophed started. EKG showing some inferolateral STE, Dr. Alfred Levins spoke with STEMI cardiologist Dr. Fletcher Anon who reviewed EKG and determined the patient did not meet STEMI criteria. Medications given: 80 mg Lasix IV,  propofol started then stopped, levophed drip started  Significant labs: (Labs/ Imaging personally reviewed) I, Domingo Pulse [place], AGACNP-BC, personally viewed and interpreted this ECG. EKG Interpretation: Date: 04/30/22, EKG Time: 03:38, Rate: 63, Rhythm: NSR, QRS Axis:  LAD, Intervals: 1st degree HB, ST/T Wave abnormalities: Inferolateral STE, non specific T wave inversions , Narrative Interpretation: NSR with 1st dgree HB & LAD with inferolateral STE that does not meet STEMI criteria per cardiology review Chemistry: pending Hematology: mild leukocytosis WBC: 12.4, Hgb: 16.6,  Troponin: 373, BNP: 971.6, lactic/PCT: pending,  COVID-19 & Influenza A/B: pending ABG: pending CXR 04/30/22: Mild interstitial pulmonary edema  PCCM consulted for admission due to acute hypoxic respiratory failure and subsequent cardiac arrest with circulatory shock requiring mechanical ventilation and vasopressor support.  Pertinent  Medical History  A-fib on Eliquis COPD Combined systolic & diastolic CHF [id number] HLD HTN Myasthenia gravis Ventricular Tachycardia   Micro Data:  5/21: SARS-CoV-2 and influenza PCR>> negative 5/21: Respiratory viral panel>> negative 5/21: MRSA PCR>> negative 5/21: Tracheal aspirate>> normal respiratory flora  Antimicrobials:  Unasyn 5/21>> Azithromycin 5/21>> 5/22  Significant Hospital Events: Including procedures, antibiotic start and stop dates in addition to other pertinent events   04/30/22: Admit to ICU s/p acute hypoxic respiratory failure and subsequent cardiac arrest with emergent intubation requiring mechanical ventilatory support. 5/22 remains intubated, on vent.  Code Status changed to DNR.  Repeat CTH negative, EEG without evidence of seizures 5/23: Patient withdraws from pain in all extremities with intermittent agitation, plan for MRI Brain & CT Abdomen 5/24: WUA with plans for extubation  Interim History / Subjective:  -No significant events noted  overnight.  Pt awake  and following commands.  Minimal ventilator settings plans for WUA and SBT if pt tolerating will extubate  Objective   Blood pressure 93/73, pulse (!) 111, temperature (!) 97.3 F (36.3 C), temperature source Axillary, resp. rate 19, weight 108.7 kg, SpO2 94 %. CVP:  [9 mmHg-27 mmHg] 17 mmHg  Vent Mode: PSV;CPAP FiO2 (%):  [35 %-60 %] 60 % Set Rate:  [22 bmp] 22 bmp Vt Set:  [500 mL] 500 mL PEEP:  [8 cmH20] 8 cmH20 Pressure Support:  [8 cmH20] 8 cmH20   Intake/Output Summary (Last 24 hours) at 05/03/2022 1003 Last data filed at 05/03/2022 0400 Gross per 24 hour  Intake 1649.33 ml  Output 2550 ml  Net -900.67 ml   Filed Weights   04/30/22 1000 05/01/22 0433 05/02/22 0457  Weight: 108 kg 108.5 kg 108.7 kg    Examination: General: Acutely ill appearing male, NAD mechanically intubated  HEENT: Supple, no JVD  Neuro: Awake, follows commands, PERRL CV: Sinus tach, s1s2, no r/g, 2+ radial/1+ distal pulses, 2+ generalized edema  Pulm: Faint rhonchi throughout, even, non labored  GI: +BS x4, obese, mildly distended, non tender  GU: Foley in place with clear yellow urine Skin: Limited exam, dusky coloring, scattered petechiae- no rashes/lesions noted Extremities: Moves all extremities   Resolv/ed Hospital Problem list     Assessment & Plan:   PEA Cardiac arrest: suspect pulmonary etiology Elevated Troponin in setting of Demand Ischemia vs. 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 Paroxsymal Atrial Fibrillation HTN  PMHx: HTN, HLD, combined CHF, A-fib on Eliquis, HCM 8 minutes downtime, CPR initiated immediately, received no defibrillations (deemed not a candidate for hypothermia protocol) -Continuous cardiac monitoring -Prn nicardipine gtt and hydralazine for bp management -Clonidine patch for bp management   -HS Troponin peaked at 2844 -Continue Heparin gtt -Cardiology following, appreciate input -Diuresis as BP and renal  function permits -Echocardiogram 5/21: LVEF 35-40%, Grade I DD, RV systolic function moderately reduced  Acute hypoxic respiratory failure suspect secondary to pulmonary edema vs muscle weakness? in the setting of COPD, combined CHF and medication non compliance & myasthenia gravis COPD Former smoker (50 pack year history) PMHx: COPD, combined CHF, MG -Full vent support, implement lung protective strategies -Plateau pressures less than 30 cm H20 -Wean FiO2 & PEEP as tolerated to maintain O2 sats >92% -Follow intermittent Chest X-ray & ABG as needed -Spontaneous Breathing Trials when respiratory parameters met and mental status permits -Implement VAP Bundle -Bronchodilators & Pulmicort nebs -IV diamox 500 mg x1 dose  -ABX as above  Myasthenia Gravis query crisis -Neurology following to assist with myasthenia gravis management and medication regimen  Acute Kidney Injury in the setting of cardiac arrest ~ IMPROVING Baseline Cr: 1.02, Cr on admission: 2.27 -Monitor I&O's / urinary output -Follow BMP -Ensure adequate renal perfusion -Avoid nephrotoxic agents as able -Replace electrolytes as indicated  Type 2 Diabetes Mellitus Hemoglobin A1C: 6.9 -CBG's q4h; Target range of 140 to 180 -SSI -Follow ICU Hypo/Hyperglycemia protocol  Transaminitis in the setting of cardiac arrest ~ IMPROVING   CT Abd Pelvis 05/02/22 revealed no focal liver abnormality is seen. No gallstones, gallbladder wall thickening, or biliary dilatation, gallbladder wall thickening, or biliary dilatation. - Trend hepatic function - Avoid hepatotoxic agents  Sedation needs in setting of mechanical ventilation 8 minutes downtime, remains minimally responsive, unclear how long and how significantly hypoxic he was -Maintain a RASS goal of 0  -Fentanyl and Versed as needed to maintain RASS goal -Avoid sedating  medications as able -Daily wake up assessment -CTH negative x2 -Neurology following, appreciate  input -Plans to wean to extubate  Best Practice (right click and "Reselect all SmartList Selections" daily)  Diet/type: NPO w/ meds via tube DVT prophylaxis: Heparin gtt GI prophylaxis: PPI Lines: Central line, Arterial Line, and yes and it is still needed Foley:  Yes, and it is still needed Code Status: Limited Code (Mechanical Intubation Only) Last date of multidisciplinary goals of care discussion [05/03/22]  Patient's wife at bedside and updated regarding plan of care  Labs   CBC: Recent Labs  Lab 04/30/22 0332 04/30/22 1734 05/01/22 0431 05/02/22 0502 05/03/22 0334  WBC 12.4* 18.9* 14.6* 13.5* 13.3*  NEUTROABS 9.4* 15.8*  --   --   --   HGB 16.6 16.3 15.3 14.5 14.8  HCT 57.7* 54.8* 49.2 46.6 47.9  MCV 104.3* 100.0 95.5 96.5 96.4  PLT 189 146* 109* 111* 90*    Basic Metabolic Panel: Recent Labs  Lab 04/30/22 0550 04/30/22 1734 04/30/22 1955 05/01/22 1050 05/01/22 1647 05/02/22 0502 05/02/22 1519 05/03/22 0334  NA 139 141  --  143  --  144  --  148*  K 4.4 4.5  --  3.5  --  3.6  --  3.6  CL 104 104  --  107  --  108  --  110  CO2 25 26  --  26  --  27  --  32  GLUCOSE 214* 170*  --  151*  --  143*  --  128*  BUN 37* 44*  --  52*  --  47*  --  39*  CREATININE 2.27* 3.18*  --  1.90*  --  1.30*  --  1.29*  CALCIUM 7.7* 7.4*  --  7.2*  --  7.4*  --  7.6*  MG  --   --  2.3 2.4 2.4 2.6* 2.5*  --   PHOS  --   --  4.7* 3.4 3.1 3.0 3.0  --    GFR: Estimated Creatinine Clearance: 58.2 mL/min (A) (by C-G formula based on SCr of 1.29 mg/dL (H)). Recent Labs  Lab 04/30/22 0546 04/30/22 0603 04/30/22 1149 04/30/22 1730 04/30/22 1734 04/30/22 1955 05/01/22 0431 05/02/22 0502 05/03/22 0334  PROCALCITON 0.24  --   --   --   --   --  1.49 0.90  --   WBC  --   --   --   --  18.9*  --  14.6* 13.5* 13.3*  LATICACIDVEN  --    < > 2.2* 4.2*  --  2.8* 2.7*  --   --    < > = values in this interval not displayed.    Liver Function Tests: Recent Labs  Lab  04/30/22 0550 04/30/22 1734 05/02/22 0502 05/03/22 0334  AST 702* 1,750* 434* 336*  ALT 657* 1,349* 972* 1,002*  ALKPHOS 40 38 39 34*  BILITOT 1.4* 1.7* 1.3* 1.3*  PROT 6.0* 5.9* 5.1* 5.7*  ALBUMIN 3.3* 3.1* 2.6* 2.9*   No results for input(s): LIPASE, AMYLASE in the last 168 hours. No results for input(s): AMMONIA in the last 168 hours.  ABG    Component Value Date/Time   PHART 7.47 (H) 05/01/2022 0612   PCO2ART 33 05/01/2022 0612   PO2ART 86 05/01/2022 0612   HCO3 24.0 05/01/2022 0612   ACIDBASEDEF 1.9 04/30/2022 0804   O2SAT 98.6 05/01/2022 0612     Coagulation Profile: Recent Labs  Lab 04/30/22 0332 04/30/22 2109  INR 2.1* 3.1*    Cardiac Enzymes: No results for input(s): CKTOTAL, CKMB, CKMBINDEX, TROPONINI in the last 168 hours.  HbA1C: Hemoglobin A1C  Date/Time Value Ref Range Status  12/21/2015 12:00 AM 6.3  Final   Hgb A1c MFr Bld  Date/Time Value Ref Range Status  04/30/2022 05:46 AM 6.9 (H) 4.8 - 5.6 % Final    Comment:    (NOTE) Pre diabetes:          5.7%-6.4%  Diabetes:              >6.4%  Glycemic control for   <7.0% adults with diabetes     CBG: Recent Labs  Lab 05/02/22 1525 05/02/22 1918 05/02/22 2326 05/03/22 0322 05/03/22 0743  GLUCAP 134* 127* 139* 121* 126*    Review of Systems:   Patient intubated and unresponsive, unable to participate in interview  Past Medical History:  He,  has a past medical history of Arthritis, COPD (chronic obstructive pulmonary disease) ([place]), Diabetes mellitus without complication ([place]), Dyspnea, Hyperlipidemia, Hypertension, Myasthenia gravis ([place]), Oxygen deficit, and Ventricular tachycardia ([place]).   Surgical History:   Past Surgical History:  Procedure Laterality Date   carpel tunnel sx     CATARACT EXTRACTION     right eye   CATARACT EXTRACTION W/PHACO Left 12/01/2020   Procedure: CATARACT EXTRACTION PHACO AND INTRAOCULAR LENS PLACEMENT (IOC) LEFT 10.83 01:14.1 14.6%;  Surgeon:  Leandrew Koyanagi, MD;  Location: [facility];  Service: Ophthalmology;  Laterality: Left;   RETINAL DETACHMENT SURGERY     SKIN GRAFT     Behind left knee   TONSILLECTOMY AND ADENOIDECTOMY       Social History:   reports that he quit smoking about 4 years ago. His smoking use included pipe and cigarettes. He has a 20.00 pack-year smoking history. He quit smokeless tobacco use about 4 years ago. He reports current alcohol use. He reports that he does not use drugs.   Family History:  His family history includes Arthritis in his father, mother, and another family member; Heart attack in his father; Heart disease (age of onset: 63) in his father; Hypertension in his mother; Kidney disease in his mother; Stroke in his father; Thyroid disease in his mother.   Allergies No Known Allergies   Home Medications  Prior to Admission medications   Medication Sig Start Date End Date Taking? Authorizing Provider  albuterol (PROVENTIL HFA;VENTOLIN HFA) 108 (90 Base) MCG/ACT inhaler Inhale into the lungs every 6 (six) hours as needed for wheezing or shortness of breath.    [provider]  apixaban (ELIQUIS) 5 MG TABS tablet Take 5 mg by mouth 2 (two) times daily.    [provider]  budesonide-formoterol (SYMBICORT) 160-4.5 MCG/ACT inhaler Inhale 2 puffs into the lungs 2 (two) times daily. 06/16/19   [name], MD  cholecalciferol (VITAMIN D) 25 MCG (1000 UNIT) tablet Take 2,000 Units by mouth daily.    [provider]  empagliflozin (JARDIANCE) 25 MG TABS tablet Take by mouth daily.    [provider]  famotidine (PEPCID) 20 MG tablet Take 20 mg by mouth as needed for heartburn or indigestion.    [provider]  flecainide (TAMBOCOR) 50 MG tablet Take 50 mg by mouth 2 (two) times daily.    [provider]  HYDROcodone-acetaminophen (NORCO/VICODIN) 5-325 MG tablet Take 1 tablet by mouth every 6 (six) hours as needed for moderate pain.  04/04/22   [name], MD  omeprazole ([place])  40 MG capsule Take 40 mg by mouth 2 (two) times daily.     [provider]  pramipexole (MIRAPEX) 0.5 MG tablet Take 1 tablet (0.5 mg total) by mouth at bedtime. 06/04/21   [name], MD  pravastatin (PRAVACHOL) 20 MG tablet Take 20 mg by mouth daily.    [provider]  predniSONE (DELTASONE) 20 MG tablet Take 15 mg by mouth daily with breakfast.    [provider]  pyridostigmine (MESTINON) 60 MG tablet Take 30 mg by mouth 3 (three) times daily.    [provider]  terbinafine (LAMISIL) 250 MG tablet Take 250 mg by mouth daily. 06/03/21   [provider]  tiotropium (SPIRIVA) 18 MCG inhalation capsule Place 18 mcg into inhaler and inhale daily.    [provider]  torsemide (DEMADEX) 20 MG tablet Take 1 tablet (20 mg) by mouth 3 days a week 10/13/21   Deboraha Sprang, MD  vitamin B-12 (CYANOCOBALAMIN) 1000 MCG tablet Take 1,000 mcg by mouth daily.    [provider]     Critical care time: 40 minutes    Donell Beers, [place] Pager [phone] (please enter 7 digits) PCCM Consult Pager [phone] (please enter 7 digits)

## 2022-05-03 NOTE — Consult Note (Signed)
PHARMACY CONSULT NOTE  Pharmacy Consult for Electrolyte Monitoring and Replacement   Recent Labs: Potassium (mmol/L)  Date Value  05/03/2022 3.6   Magnesium (mg/dL)  Date Value  05/02/2022 2.5 (H)   Calcium (mg/dL)  Date Value  05/03/2022 7.6 (L)   Albumin (g/dL)  Date Value  05/03/2022 2.9 (L)  04/03/2022 4.1   Phosphorus (mg/dL)  Date Value  05/02/2022 3.0   Sodium (mmol/L)  Date Value  05/03/2022 148 (H)  04/03/2022 149 (H)   Assessment: Patient is a 76 y/o M with medical history including Afib on Eliquis, COPD, combined systolic and diastolic CHF, K0XF, HLD, HTN, myasthenia gravis, ventricular tachycardia who presented to the ED 5/21 early AM with chest pain and respiratory distress. Patient experienced cardiac arrest with attainment of ROSC after 8 minutes of CPR. Patient is currently intubated, sedated, and on mechanical ventilation in the ICU. Pharmacy consulted to assist with electrolyte monitoring and replacement as indicated.  Nutrition: Tube feeds initiated 5/22 (on hold)  AKI resolving  Patient not tolerating anything down OG tube (emesis). CT abdomen / pelvis 5/23 with no acute findings  Goal of Therapy:  Electrolytes within normal limits  Plan:  --Na 148, consider D5w gtt if still un-able to tolerate OG tube access today --K 3.6, IV Kcl 10 mEq x 2 --Corrected Ca 8.5. Bradycardia appears improved overall. Will hold off on further IV calcium for now. Consider IV calcium gluconate 1 g x 1 dose today if clinically indicated --Follow-up electrolytes with AM labs tomorrow --Avoid IV magnesium if possible given history of myasthenia gravis  [name] 05/03/2022 7:50 AM

## 2022-05-03 NOTE — Progress Notes (Signed)
Progress Note  Patient Name: Joseph Macdonald Date of Encounter: 05/03/2022  [facility] Cardiologist: [name], MD   Subjective   Patient seen on AM rounds. Remains intubated and on Fentanyl drip for pain management, but is able to now follow simple commands. Wife is at the bedside. Remains in sinus. Preparing for SBT today with extubation.  Inpatient Medications    Scheduled Meds:  budesonide (PULMICORT) nebulizer solution  0.25 mg Nebulization BID   chlorhexidine gluconate (MEDLINE KIT)  15 mL Mouth Rinse BID   Chlorhexidine Gluconate Cloth  6 each Topical Daily   docusate  100 mg Per Tube BID   feeding supplement (PROSource TF)  45 mL Per Tube TID   feeding supplement (VITAL HIGH PROTEIN)  1,000 mL Per Tube Q24H   free water  30 mL Per Tube Q4H   hydrALAZINE  10 mg Intravenous STAT   insulin aspart  0-20 Units Subcutaneous Q4H   ipratropium-albuterol  3 mL Nebulization Q6H   mouth rinse  15 mL Mouth Rinse 10 times per day   pantoprazole (PROTONIX) IV  40 mg Intravenous Daily   polyethylene glycol  17 g Per Tube Daily   Continuous Infusions:  ampicillin-sulbactam (UNASYN) IV Stopped (05/03/22 0150)   fentaNYL infusion INTRAVENOUS 100 mcg/hr (05/03/22 0400)   heparin 1,650 Units/hr (05/03/22 0000)   niCARDipine Stopped (05/03/22 0006)   potassium chloride     PRN Meds: atropine, docusate, fentaNYL (SUBLIMAZE) injection, fentaNYL (SUBLIMAZE) injection, hydrALAZINE, midazolam, polyethylene glycol   Vital Signs    Vitals:   05/03/22 0500 05/03/22 0558 05/03/22 0600 05/03/22 0743  BP:      Pulse: (!) 58 (!) 59 (!) 56   Resp: (!) 22 (!) 23 (!) 22   Temp:      TempSrc:      SpO2: 94% 95% 94% 96%  Weight:        Intake/Output Summary (Last 24 hours) at 05/03/2022 0812 Last data filed at 05/03/2022 0400 Gross per 24 hour  Intake 1762.86 ml  Output 2550 ml  Net -787.14 ml      05/02/2022    4:57 AM 05/01/2022    4:33 AM 04/30/2022   10:00 AM  Last 3  Weights  Weight (lbs) 239 lb 10.2 oz 239 lb 3.2 oz 238 lb 1.6 oz  Weight (kg) 108.7 kg 108.5 kg 108 kg      Telemetry    Sinus to sinus tachycardia rates 40-110- Personally Reviewed  ECG    No recent studies completed.  Physical Exam   GEN: No acute distress. Resting on the ventilator. Able to follow simple commands. Neck: No JVD appreciated Cardiac: RRR, no murmurs, rubs, or gallops.  Respiratory: Clear to auscultation bilaterally, vent assisted on 40% FiO2 GI: Soft, nontender, non-distended, obese, + bowel sounds MS: trace edema to bilateral lower extremities; No deformity. Neuro:  Nonfocal  Psych: Normal affect   Labs    High Sensitivity Troponin:   Recent Labs  Lab 04/30/22 0332 04/30/22 0546 04/30/22 1734 04/30/22 1955  TROPONINIHS 373* 1,137* 2,844* 2,210*     Chemistry Recent Labs  Lab 04/30/22 1734 04/30/22 1955 05/01/22 1050 05/01/22 1647 05/02/22 0502 05/02/22 1519 05/03/22 0334  NA 141  --  143  --  144  --  148*  K 4.5  --  3.5  --  3.6  --  3.6  CL 104  --  107  --  108  --  110  CO2 26  --  26  --  27  --  32  GLUCOSE 170*  --  151*  --  143*  --  128*  BUN 44*  --  52*  --  47*  --  39*  CREATININE 3.18*  --  1.90*  --  1.30*  --  1.29*  CALCIUM 7.4*  --  7.2*  --  7.4*  --  7.6*  MG  --    < > 2.4 2.4 2.6* 2.5*  --   PROT 5.9*  --   --   --  5.1*  --  5.7*  ALBUMIN 3.1*  --   --   --  2.6*  --  2.9*  AST 1,750*  --   --   --  434*  --  336*  ALT 1,349*  --   --   --  972*  --  1,002*  ALKPHOS 38  --   --   --  39  --  34*  BILITOT 1.7*  --   --   --  1.3*  --  1.3*  GFRNONAA 20*  --  36*  --  57*  --  58*  ANIONGAP 11  --  10  --  9  --  6   < > = values in this interval not displayed.    Lipids No results for input(s): CHOL, TRIG, HDL, LABVLDL, LDLCALC, CHOLHDL in the last 168 hours.  Hematology Recent Labs  Lab 05/01/22 0431 05/02/22 0502 05/03/22 0334  WBC 14.6* 13.5* 13.3*  RBC 5.15 4.83 4.97  HGB 15.3 14.5 14.8  HCT 49.2  46.6 47.9  MCV 95.5 96.5 96.4  MCH 29.7 30.0 29.8  MCHC 31.1 31.1 30.9  RDW 13.3 13.5 13.8  PLT 109* 111* 90*   Thyroid  Recent Labs  Lab 04/30/22 0546  TSH 0.441  FREET4 0.88    BNP Recent Labs  Lab 04/30/22 0332  BNP 971.6*    DDimer No results for input(s): DDIMER in the last 168 hours.   Radiology    CT ABDOMEN PELVIS WO CONTRAST  Result Date: 05/02/2022 CLINICAL DATA:  Bowel obstruction suspected. EXAM: CT ABDOMEN AND PELVIS WITHOUT CONTRAST TECHNIQUE: Multidetector CT imaging of the abdomen and pelvis was performed following the standard protocol without IV contrast. RADIATION DOSE REDUCTION: This exam was performed according to the departmental dose-optimization program which includes automated exposure control, adjustment of the mA and/or kV according to patient size and/or use of iterative reconstruction technique. COMPARISON:  CT examination dated June 10, 2019 FINDINGS: Lower chest: Small bilateral pleural effusions with associated atelectasis, right greater than the left. Hepatobiliary: No focal liver abnormality is seen. No gallstones, gallbladder wall thickening, or biliary dilatation. Pancreas: Moderate pancreatic atrophy. No pancreatic ductal dilatation or surrounding inflammatory changes. Spleen: Normal in size without focal abnormality. Calcified granuloma in the spleen is noted. Adrenals/Urinary Tract: Adrenal glands are unremarkable. Kidneys are normal, without renal calculi, focal lesion, or hydronephrosis. Nonspecific bilateral perinephric fat stranding. Urinary bladder is collapsed about the [facility] catheter. Stomach/Bowel: Stomach is within normal limits. NG tube with distal tip in the body of the stomach. Appendix appears normal. No evidence of bowel wall thickening, distention, or inflammatory changes. Scattered colonic diverticuli without evidence of acute diverticulitis. Vascular/Lymphatic: Aortic atherosclerosis. No enlarged abdominal or pelvic lymph nodes.  Right femoral access central line. Reproductive: Prostate is unremarkable. Other: No abdominal wall hernia or abnormality. No abdominopelvic ascites. Mild generalized anasarca suggesting fluid overload. 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 IMPRESSION: 1. Bowel loops are normal in caliber. Scattered colonic diverticula without evidence of acute diverticulitis. 2. Small bilateral pleural effusions with associated atelectasis, right greater than the left. 3.  Mild generalized anasarca. 4.  No CT evidence of acute abdominal/pelvic process. 5.  Additional chronic findings as above. Electronically Signed   By: Keane Police D.O.   On: 05/02/2022 14:32   MR BRAIN WO CONTRAST  Result Date: 05/02/2022 CLINICAL DATA:  Anoxic brain injury EXAM: MRI HEAD WITHOUT CONTRAST TECHNIQUE: Multiplanar, multiecho pulse sequences of the brain and surrounding structures were obtained without intravenous contrast. COMPARISON:  CT head 05/01/2022 FINDINGS: Brain: Negative for acute infarct. No evidence of acute anoxic injury. Ventricle size normal. Moderate white matter changes with patchy hyperintensity throughout the white matter. Mild hyperintensity in the pons. Negative for hemorrhage or mass. Vascular: Normal arterial flow voids. Skull and upper cervical spine: Negative Sinuses/Orbits: Paranasal sinuses clear. Other: None IMPRESSION: No acute abnormality.  Negative for acute anoxic brain injury Moderate chronic microvascular ischemic change in the white matter and pons. Electronically Signed   By: Franchot Gallo M.D.   On: 05/02/2022 14:20   DG Chest Port 1 View  Result Date: 05/03/2022 CLINICAL DATA:  Acute respiratory failure with hypoxia. EXAM: PORTABLE CHEST 1 VIEW COMPARISON:  Apr 30, 2022. FINDINGS: Stable cardiomegaly. Nasogastric tube tip is seen in the proximal stomach. Bilateral interstitial densities are noted concerning for  pulmonary edema. Left pleural effusion is noted. Minimal right basilar subsegmental atelectasis is noted. Bony thorax is unremarkable. IMPRESSION: Probable bilateral pulmonary edema. Mild left pleural effusion is noted. Minimal right basilar subsegmental atelectasis is noted. Electronically Signed   By: Marijo Conception M.D.   On: 05/03/2022 07:58   EEG adult  Result Date: 05/01/2022 Derek Jack, MD     05/01/2022  9:15 PM Routine EEG Report DARAN FAVARO is a 76 y.o. male with a history of cardiac arrest who is undergoing an EEG to evaluate for seizures. Report: This EEG was acquired with electrodes placed according to the International 10-20 electrode system (including Fp1, Fp2, F3, F4, C3, C4, P3, P4, O1, O2, T3, T4, T5, T6, A1, A2, Fz, Cz, Pz). The following electrodes were missing or displaced: none. The best background was continuous at 5-7 Hz. This activity is reactive to stimulation. No sleep architecture was identified. There was no focal slowing. There were no interictal epileptiform discharges. There were no electrographic seizures identified. Photic stimulation and hyperventilation were not performed. Impression and clinical correlation: This EEG was obtained while sedated on midazolam and fentanyl and was abnormal due to mild-to-moderate diffuse slowing indicative of global cerebral dysfunction. Epileptiform abnormalities were not seen during this recording. Su Monks, MD [facility] [phone] If 7pm- 7am, please page neurology on call as listed in [place].    Cardiac Studies  Echocardiogram completed on 04/30/2022 1. Left ventricular ejection fraction, by estimation, is 35 to 40%. The  left ventricle has moderately decreased function. The left ventricle  demonstrates global hypokinesis. There is mild left ventricular  hypertrophy. Left ventricular diastolic  parameters are consistent with Grade I diastolic dysfunction (impaired  relaxation).   2. Right ventricular  systolic function is moderately reduced. The right  ventricular size is mildly enlarged.   3. The mitral valve is normal in structure. No evidence of mitral valve  regurgitation.   4. The aortic valve is grossly normal. Aortic valve regurgitation is not  visualized.  Patient Profile     76 y.o. male with history of HFpEF, essential hypertension, VT, PAF, hyperlipidemia, COPD, myasthenia gravis who presented to the hospital due to worsening shortness of breath and severe respiratory distress. He developed PEA arrest upon presentation with 8 minutes of CPR without any known shockable rhythm.  Assessment & Plan    PEA cardiac arrest likely due to pulmonary etilogy - HS troponins slightly elevated without evidence of acute coronary syndrome - Echocardiogram completed with a drop in EF noted likely from myocardial stunning, revisit limited study at a later date - Continue cardiac monitor - EKG PRN - More awake today, SBT and possible extubation, supportive care by PCCM   2. Acute on chronic systolic congestive heart failure - LVEF dropped to 35% with previous results of 45-50% - GDMT remains on hold due to continued elevated kidney function - If continued improvement consider R/LHC prior to discharge - Daily weight, strict I & O, low sodium feeds and in diet when advanced  3. Paroxysmal atrial fibrillation - Continue heparin drip  - Currently in sinus - Flecainide discontinued due to drop in EF and worsening renal function     For questions or updates, please contact [place] Please consult [email or web address] for contact info under        Signed, Hobie Kohles, NP  05/03/2022, 8:12 AM

## 2022-05-03 NOTE — Progress Notes (Signed)
Patient was extubated earlier this AM Patient with increased WOB and placed on 100% fio2 and HFNC Patient using accessory muscles to breathe Family at bedside is witnessing demise  I have explained that patient will need to be re-intubated and will need a TRACH for survival. Family has agreed with this plan of action.   Additional CC time of 25 mins  Critical care was necessary to treat /prevent imminent and life-threatening deterioration. Overall, patient is critically ill, prognosis is guarded.  Patient with Multiorgan failure and at high risk for cardiac arrest and death.    Corrin Parker, M.D.  [facility]  Medical Director [place] Director [facility] Cardio-Pulmonary Department

## 2022-05-03 NOTE — Procedures (Signed)
Endotracheal Intubation: Patient required placement of an artificial airway secondary to acute hypoxic respiratory failure    Consent: Emergent.    Hand washing performed prior to starting the procedure.    Medications administered for sedation prior to procedure:  Fentanyl 100 mcg IV, Versed 4 mg IV, 20 mg Etomidate IV, Rocuronium 90 mg IV     A time out procedure was called and correct patient, name, & ID confirmed. Needed supplies and equipment were assembled and checked to include ETT, 10 ml syringe, Glidescope, Mac and Miller blades, suction, oxygen and bag mask valve, end tidal CO2 monitor.    Patient was positioned to align the mouth and pharynx to facilitate visualization of the glottis.    Heart rate, SpO2 and blood pressure was continuously monitored during the procedure. Pre-oxygenation was conducted prior to intubation and endotracheal tube was placed through the vocal cords into the trachea.       The artificial airway was placed under direct visualization via glidescope route using a 7.5 cm ETT on the first attempt.   ETT was secured at 24 cm .   Placement was confirmed by auscuitation of lungs with good breath sounds bilaterally and no stomach sounds.  Condensation was noted on endotracheal tube.   Pulse ox 86%    CO2 detector in place with appropriate color change.    Complications: None .        Chest radiograph ordered and pending.   Donell Beers, [place] Pager [phone] (please enter 7 digits) PCCM Consult Pager [phone] (please enter 7 digits)

## 2022-05-03 NOTE — Progress Notes (Signed)
[place] for IV Heparin Indication: atrial fibrillation  Patient Measurements: Weight: 108.7 kg (239 lb 10.2 oz) Heparin Dosing Weight: 90 kg  Labs: Recent Labs    04/30/22 0332 04/30/22 0546 04/30/22 0550 04/30/22 1734 04/30/22 1955 04/30/22 2109 05/01/22 0431 05/01/22 1050 05/01/22 1210 05/02/22 0502 05/02/22 1519 05/02/22 2341  HGB 16.6  --   --  16.3  --   --  15.3  --   --  14.5  --   --   HCT 57.7*  --   --  54.8*  --   --  49.2  --   --  46.6  --   --   PLT 189  --   --  146*  --   --  109*  --   --  111*  --   --   APTT  --   --    < >  --   --  110* 98*  --  83* 60* 49* 75*  LABPROT 23.0*  --   --   --   --  31.8*  --   --   --   --   --   --   INR 2.1*  --   --   --   --  3.1*  --   --   --   --   --   --   HEPARINUNFRC  --   --   --   --   --   --   --   --  >1.10* >1.10*  --  >1.10*  CREATININE  --   --    < > 3.18*  --   --   --  1.90*  --  1.30*  --   --   TROPONINIHS 373* 1,137*  --  2,844* 2,210*  --   --   --   --   --   --   --    < > = values in this interval not displayed.    Estimated Creatinine Clearance: 57.7 mL/min (A) (by C-G formula based on SCr of 1.3 mg/dL (H)).   Medical History: Past Medical History:  Diagnosis Date   Arthritis    COPD (chronic obstructive pulmonary disease) ([place])    Diabetes mellitus without complication (HCC)    diet controlled   Dyspnea    with activity   Hyperlipidemia    Hypertension    Myasthenia gravis ([place])    Oxygen deficit    Ventricular tachycardia [facility])    Assessment: 76 yo M w/ PMH of atrial fibrillation, COPD, CHF, DM, HLD, HTN presenting to [facility] ED via EMS from home on 04/30/22 in respiratory distress. Prior to admission he was receiving apixaban with last dose unknown. Baseline INR 2.1, H&H/PLT wnl, LFTs elevated  Goal of Therapy:  Heparin level 0.3-0.7 units/ml aPTT 66 - 102 seconds Monitor platelets by anticoagulation protocol: Yes    Results Date/time HL/aPTT Comments 5/21 2109 aPTT 110 Suprather; 1300 un/hr 5/22 0431 aPTT 98 Thera x 1; 1200 un/hr 5/22 1210 aPTT 83 Thera x 2, 1200 un/hr 5/23 0502       aPTT 60/ HL > 1.10    SUBtherapeutic  5/23 1519 aPTT 48 Subthera, 1400 > 1650 5/23 2341       aPTT 75/HL >  1.10 , therapeutic X 1  Plan:  5/23 @ 2778:  aPTT = 75, HL = > 1.10 aPTT therapeutic X 1 but HL still elevated  from Eliquis PTA Will continue pt on current rate and recheck HL and aPTT on 5/24 @ 0800.   Orene Desanctis, PharmD Clinical Pharmacist   05/03/2022 12:21 AM

## 2022-05-03 NOTE — Progress Notes (Signed)
Overnight patient was very agitated requiring PRN fentanyl every 30-60 minutes to control agitation. Added low dose Fentanyl gtt overnight and increased PRN Versed from '1mg'$  to '2mg'$  IV. Plan is to stop fentanyl gtt around 0600 for SAT/SBT this AM.     Tonye Royalty ACNP-BC

## 2022-05-03 NOTE — Progress Notes (Signed)
  Daily Progress Note   Patient Name: Joseph Macdonald       Date: 05/03/2022 DOB: Aug 23, 1946  Age: 76 y.o. MRN#: [id number] Attending Physician: Flora Lipps, MD Primary Care Physician: [name], MD Admit Date: 04/30/2022 Length of Stay: 3 days  Reason for Consultation/Follow-up: {Reason for Consult:23484}  HPI/Patient Profile:  ***  Subjective:   Subjective: Chart Reviewed. Updates received. Patient Assessed. Created space and opportunity for patient  and family to explore thoughts and feelings regarding current medical situation.  Today's Discussion: ***  Review of Systems  Objective:   Vital Signs:  BP (!) 187/80   Pulse (!) 112   Temp (!) 97.3 F (36.3 C) (Axillary)   Resp (!) 24   Wt 108.7 kg   SpO2 93%   BMI 37.53 kg/m   Physical Exam: Physical Exam  Palliative Assessment/Data: ***   Assessment & Plan:   Impression: Present on Admission:  Cardiac arrest ([place])  ***  SUMMARY OF RECOMMENDATIONS   ***  Symptom Management:  ***  Code Status: {Palliative Code status:23503}  Prognosis: {Palliative Care Prognosis:23504}  Discharge Planning: {Palliative dispostion:23505}  Discussed with: ***  Thank you for allowing [place] to participate in the care of Joseph Macdonald PMT will continue to support holistically.  Time Total: ***  Visit consisted of counseling and education dealing with the complex and emotionally intense issues of symptom management and palliative care in the setting of serious and potentially life-threatening illness. Greater than 50%  of this time was spent counseling and coordinating care related to the above assessment and plan.  [name], NP Palliative Medicine Team  Team Phone # [phone] (Nights/Weekends)  08/09/2021, 8:17 AM

## 2022-05-03 NOTE — Progress Notes (Signed)
Unable to obtain daily patient weight due to bed error.  Cameron Ali, RN

## 2022-05-03 NOTE — Procedures (Signed)
PROCEDURE: BRONCHOSCOPY Therapeutic Aspiration of Tracheobronchial Tree  PROCEDURE DATE: 05/03/2022  TIME:  NAME:  Joseph Macdonald  DOB:03-24-1946  MRN: [id number] LOC:  IC15A/IC15A-AA    HOSP DAY: '@LENGTHOFSTAYDAYS'$ @ CODE STATUS:      Code Status Orders  (From admission, onward)           Start     Ordered   05/03/22 0940  Limited resuscitation (code)  Continuous       Question Answer Comment  In the event of cardiac or respiratory ARREST: Initiate Code Blue, Call Rapid Response Yes   In the event of cardiac or respiratory ARREST: Perform CPR No   In the event of cardiac or respiratory ARREST: Perform Intubation/Mechanical Ventilation Yes   In the event of cardiac or respiratory ARREST: Use NIPPV/BiPAp only if indicated No   In the event of cardiac or respiratory ARREST: Administer ACLS medications if indicated Yes   In the event of cardiac or respiratory ARREST: Perform Defibrillation or Cardioversion if indicated No      05/03/22 0940           Code Status History     Date Active Date Inactive Code Status Order ID Comments User Context   05/01/2022 1642 05/03/2022 0940 DNR [id number]  Flora Lipps, MD Inpatient   04/30/2022 0351 05/01/2022 1642 Full Code [id number]  [place], Huel Cote, NP ED   06/11/2019 0331 06/13/2019 1400 Full Code [id number]  Harrie Foreman, MD ED           Indications/Preliminary Diagnosis:   Consent: (Place X beside choice/s below)  The benefits, risks and possible complications of the procedure were        explained to:  ___ patient  _x patient's family  ___ other:___________  who verbalized understanding and gave:  ___ verbal  ___ written  _x_ verbal and written  ___ telephone  ___ other:________ consent.      Unable to obtain consent; procedure performed on emergent basis.     Other:       PRESEDATION ASSESSMENT: History and Physical has been performed. Patient meds and allergies have been reviewed. Presedation airway  examination has been performed and documented. Baseline vital signs, sedation score, oxygenation status, and cardiac rhythm were reviewed. Patient was deemed to be in satisfactory condition to undergo the procedure.     PROCEDURE DETAILS: Timeout performed and correct patient, name, & ID confirmed. Following prep per Pulmonary policy, appropriate sedation was administered. The Bronchoscope was inserted in to oral cavity with bite block in place. Therapeutic aspiration of Tracheobronchial tree was performed.  Airway exam proceeded with findings, technical procedures, and specimen collection as noted below. At the end of exam the scope was withdrawn without incident. Impression and Plan as noted below.        Insertion Route (Place X beside choice below)   Nasal   Oral  x Endotracheal Tube   Tracheostomy    Medication Amt Dose  Medication Amt Dose  Lidocaine 1%  cc  Epinephrine 1:10,000 sol  cc  Xylocaine 4%  cc  Cocaine  cc   TECHNICAL PROCEDURES: (Place X beside choice below)   Procedures  Description    None     Electrocautery     Cryotherapy     Balloon Dilatation     Bronchography     Stent Placement   x  Therapeutic Aspiration Bloody mucus plugs    Laser/Argon Plasma    Brachytherapy Catheter Placement    Foreign  Body Removal         SPECIMENS (Sites): (Place X beside choice below)  Specimens Description   No Specimens Obtained     Washings   x Lavage Bloody mucus plugs   Biopsies    Fine Needle Aspirates    Brushings    Sputum    FINDINGS: Bloody mucus plugs in the  left lung ESTIMATED BLOOD LOSS: none COMPLICATIONS/RESOLUTION: none       IMPRESSION:POST-PROCEDURE DX: Bloody mucus plugs  pneumonia  RECOMMENDATION/PLAN:   Await cultures   Corrin Parker, M.D.  [facility]  Medical Director [place] Director [place] Department

## 2022-05-04 ENCOUNTER — Inpatient Hospital Stay: Payer: PPO

## 2022-05-04 DIAGNOSIS — I420 Dilated cardiomyopathy: Secondary | ICD-10-CM

## 2022-05-04 DIAGNOSIS — I469 Cardiac arrest, cause unspecified: Secondary | ICD-10-CM | POA: Diagnosis not present

## 2022-05-04 DIAGNOSIS — J989 Respiratory disorder, unspecified: Secondary | ICD-10-CM | POA: Diagnosis not present

## 2022-05-04 DIAGNOSIS — Z7189 Other specified counseling: Secondary | ICD-10-CM | POA: Diagnosis not present

## 2022-05-04 DIAGNOSIS — Z515 Encounter for palliative care: Secondary | ICD-10-CM | POA: Diagnosis not present

## 2022-05-04 DIAGNOSIS — R7401 Elevation of levels of liver transaminase levels: Secondary | ICD-10-CM

## 2022-05-04 DIAGNOSIS — I5020 Unspecified systolic (congestive) heart failure: Secondary | ICD-10-CM | POA: Diagnosis not present

## 2022-05-04 DIAGNOSIS — J810 Acute pulmonary edema: Secondary | ICD-10-CM | POA: Diagnosis not present

## 2022-05-04 LAB — BASIC METABOLIC PANEL
Anion gap: 7 (ref 5–15)
BUN: 43 mg/dL — ABNORMAL HIGH (ref 8–23)
CO2: 30 mmol/L (ref 22–32)
Calcium: 7.3 mg/dL — ABNORMAL LOW (ref 8.9–10.3)
Chloride: 111 mmol/L (ref 98–111)
Creatinine, Ser: 1.28 mg/dL — ABNORMAL HIGH (ref 0.61–1.24)
GFR, Estimated: 58 mL/min — ABNORMAL LOW (ref >60)
Glucose, Bld: 137 mg/dL — ABNORMAL HIGH (ref 70–99)
Potassium: 3.8 mmol/L (ref 3.5–5.1)
Sodium: 148 mmol/L — ABNORMAL HIGH (ref 135–145)

## 2022-05-04 LAB — CBC
HCT: 47.2 % (ref 39.0–52.0)
Hemoglobin: 14.1 g/dL (ref 13.0–17.0)
MCH: 29.6 pg (ref 26.0–34.0)
MCHC: 29.9 g/dL — ABNORMAL LOW (ref 30.0–36.0)
MCV: 99 fL (ref 80.0–100.0)
Platelets: 82 10*3/uL — ABNORMAL LOW (ref 150–400)
RBC: 4.77 MIL/uL (ref 4.22–5.81)
RDW: 14.1 % (ref 11.5–15.5)
WBC: 9.2 10*3/uL (ref 4.0–10.5)
nRBC: 0.2 % (ref 0.0–0.2)

## 2022-05-04 LAB — PHOSPHORUS: Phosphorus: 3.6 mg/dL (ref 2.5–4.6)

## 2022-05-04 LAB — GLUCOSE, CAPILLARY
Glucose-Capillary: 130 mg/dL — ABNORMAL HIGH (ref 70–99)
Glucose-Capillary: 134 mg/dL — ABNORMAL HIGH (ref 70–99)
Glucose-Capillary: 136 mg/dL — ABNORMAL HIGH (ref 70–99)
Glucose-Capillary: 173 mg/dL — ABNORMAL HIGH (ref 70–99)
Glucose-Capillary: 174 mg/dL — ABNORMAL HIGH (ref 70–99)
Glucose-Capillary: 179 mg/dL — ABNORMAL HIGH (ref 70–99)

## 2022-05-04 LAB — HEPARIN LEVEL (UNFRACTIONATED): Heparin Unfractionated: 0.84 IU/mL — ABNORMAL HIGH (ref 0.30–0.70)

## 2022-05-04 LAB — APTT: aPTT: 91 seconds — ABNORMAL HIGH (ref 24–36)

## 2022-05-04 LAB — MAGNESIUM: Magnesium: 2.5 mg/dL — ABNORMAL HIGH (ref 1.7–2.4)

## 2022-05-04 MED ORDER — PIPERACILLIN-TAZOBACTAM 3.375 G IVPB
3.3750 g | Freq: Three times a day (TID) | INTRAVENOUS | Status: AC
  Administered 2022-05-04 – 2022-05-11 (×23): 3.375 g via INTRAVENOUS
  Filled 2022-05-04 (×20): qty 50

## 2022-05-04 MED ORDER — POTASSIUM CHLORIDE 20 MEQ PO PACK
40.0000 meq | PACK | Freq: Once | ORAL | Status: AC
  Administered 2022-05-04: 40 meq
  Filled 2022-05-04: qty 2

## 2022-05-04 MED ORDER — PREDNISONE 10 MG PO TABS
5.0000 mg | ORAL_TABLET | Freq: Every day | ORAL | Status: DC
  Administered 2022-05-04: 5 mg
  Filled 2022-05-04: qty 1

## 2022-05-04 MED ORDER — METHYLPREDNISOLONE SODIUM SUCC 40 MG IJ SOLR
20.0000 mg | Freq: Two times a day (BID) | INTRAMUSCULAR | Status: DC
  Administered 2022-05-04 – 2022-05-09 (×11): 20 mg via INTRAVENOUS
  Filled 2022-05-04 (×11): qty 1

## 2022-05-04 MED ORDER — PYRIDOSTIGMINE BROMIDE 60 MG PO TABS
30.0000 mg | ORAL_TABLET | Freq: Three times a day (TID) | ORAL | Status: DC
  Administered 2022-05-04 – 2022-05-05 (×5): 30 mg
  Filled 2022-05-04 (×8): qty 0.5

## 2022-05-04 NOTE — Progress Notes (Signed)
NAME:  Joseph Macdonald, MRN:  [id number], DOB:  03/23/1946, LOS: 4 ADMISSION DATE:  04/30/2022, CONSULTATION DATE:  04/30/22 REFERRING MD:  Dr. Alfred Levins, CHIEF COMPLAINT:  Respiratory distress   History of Present Illness:  76 yo M presenting to [facility] ED via EMS from home on 04/30/22 in respiratory distress. Per documentation patient was found by EMS hypoxic with SpO2 71%-88% placed on NRB with albuterol administered. Family reports the patient was in his normal state of health but had NOT been taking his Lasix prescription. They believe he was taking the rest of his medication as prescribed but did not take his nighttime medications on Saturday. Family denies fever/chills, vomiting, slurred speech, weakness. They did report he had some nausea, one episode of diarrhea and mild nasal congestion as well as a non productive cough. They report he has chronic shortness of breath which started to appear more severe on Saturday.  They confirmed that the patient does not wear oxygen at home. Per report he quit smoking in 2018 after a 50 pack year history, drinks ETOH occasionally and has no recreational drug use history.   ED course: Upon arrival, per EDP report the patient was in severe respiratory distress and "purple". Before they were able to intervene, patient lost pulses. He was not on telemetry yet and it was unclear what was the initial rhythm. Patient received CPR for 8 minutes no defibrillations, and was emergently intubated requiring mechanical ventilatory support. Upon intubation, EDP noted frothy respiratory secretions. ROSC obtained and patient extremely hypertensive with SBP in the 200's. Lasix given and propofol started. SBP subsequently dropped into the 60's, propofol stopped and levophed started. EKG showing some inferolateral STE, Dr. Alfred Levins spoke with STEMI cardiologist Dr. Fletcher Anon who reviewed EKG and determined the patient did not meet STEMI criteria. Medications given: 80 mg Lasix IV,  propofol started then stopped, levophed drip started  Significant labs: (Labs/ Imaging personally reviewed) I, Domingo Pulse [place], AGACNP-BC, personally viewed and interpreted this ECG. EKG Interpretation: Date: 04/30/22, EKG Time: 03:38, Rate: 63, Rhythm: NSR, QRS Axis:  LAD, Intervals: 1st degree HB, ST/T Wave abnormalities: Inferolateral STE, non specific T wave inversions , Narrative Interpretation: NSR with 1st dgree HB & LAD with inferolateral STE that does not meet STEMI criteria per cardiology review Chemistry: pending Hematology: mild leukocytosis WBC: 12.4, Hgb: 16.6,  Troponin: 373, BNP: 971.6, lactic/PCT: pending,  COVID-19 & Influenza A/B: pending ABG: pending CXR 04/30/22: Mild interstitial pulmonary edema  PCCM consulted for admission due to acute hypoxic respiratory failure and subsequent cardiac arrest with circulatory shock requiring mechanical ventilation and vasopressor support.  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 5/22 remains intubated, on vent.  Code Status changed to DNR.  Repeat CTH negative, EEG without evidence of seizures 5/23: Patient withdraws from pain in all extremities with intermittent agitation, plan for MRI Brain & CT Abdomen 5/24: WUA with plans for extubation 5/24 failed extubation, LEFT lung white out on CXR 5/24  re-intubated, severe hypoxia, s/p  bronch removed bloody mucus plugs 5/25 remains on vent  Interim History / Subjective:  Severe hypoxia Failed extubation 5/24 Remains critically ill Prognosis is very poor  Vent Mode: PRVC FiO2 (%):  [40 %-100 %] 70 % Set Rate:  [20 bmp] 20 bmp Vt Set:  [500 mL] 500 mL PEEP:  [8 cmH20-10 cmH20] 10 cmH20 Pressure Support:  [8 cmH20] 8 cmH20 Plateau Pressure:  [23 cmH20-25 cmH20] 23 cmH20    Objective   Blood pressure (!) 187/80, pulse 64, temperature 99 F (37.2 C), resp. rate 20, weight 108.7 kg, SpO2 100 %. CVP:  [14 mmHg-17 mmHg] 17 mmHg  Vent Mode: PRVC FiO2 (%):  [40 %-100 %] 70 % Set Rate:  [20 bmp] 20 bmp Vt Set:  [500 mL] 500 mL PEEP:  [8 cmH20-10 cmH20] 10 cmH20 Pressure Support:  [8 cmH20] 8 cmH20 Plateau Pressure:  [23 cmH20-25 cmH20] 23 cmH20   Intake/Output Summary (Last 24 hours) at 05/04/2022 2979 Last data filed at 05/04/2022 0600 Gross per 24 hour  Intake 1659.12 ml  Output 3655 ml  Net -1995.88 ml    Filed Weights   04/30/22 1000 05/01/22 0433 05/02/22 0457  Weight: 108 kg 108.5 kg 108.7 kg    REVIEW OF SYSTEMS  PATIENT IS UNABLE TO PROVIDE COMPLETE REVIEW OF SYSTEMS DUE TO SEVERE CRITICAL ILLNESS AND TOXIC METABOLIC ENCEPHALOPATHY    PHYSICAL EXAMINATION:  GENERAL:critically ill appearing, +resp distress EYES: Pupils equal, round, reactive to light.  No scleral icterus.  MOUTH: Moist mucosal membrane. INTUBATED NECK: Supple.  PULMONARY: +rhonchi, +wheezing CARDIOVASCULAR: S1 and S2.  No murmurs  GASTROINTESTINAL: Soft, nontender, -distended. Positive bowel sounds.  MUSCULOSKELETAL: No swelling, clubbing, or edema.  NEUROLOGIC: obtunded SKIN:intact,warm,dry     Assessment & Plan:  76 yo white male with  PEA Cardiac arrest: suspect pulmonary etiology due to pneumonia with Elevated Troponin in setting of Demand Ischemia /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 Paroxsymal  Atrial Fibrillation failed trial of extubation due to lung collapse and inability to protect airway with severe resp insufficiency with underlying MG   Severe ACUTE Hypoxic and Hypercapnic Respiratory Failure -continue Mechanical Ventilator support -Wean Fio2 and PEEP as tolerated -VAP/VENT bundle implementation - Wean PEEP & FiO2 as tolerated, maintain SpO2 > 88% - Head of bed elevated 30 degrees, VAP protocol in place - Plateau pressures less than 30 cm H20  - Intermittent chest x-ray & ABG PRN - Ensure adequate pulmonary hygiene  -will NOT perform SAT/SBT   ACUTE SYSTOLIC CARDIAC FAILURE- EF 20% 8 minutes downtime, CPR initiated immediately, received no defibrillations (deemed not a candidate for hypothermia protocol) Patient neurologically recovered -oxygen as needed -Lasix as tolerated -follow up cardiac enzymes as indicated -Continue Heparin gtt -Cardiology following, appreciate input -Diuresis as BP and renal function permits -Echocardiogram 5/21: LVEF 35-40%, Grade I DD, RV systolic function moderately reduced   Myasthenia Gravis query crisis -Neurology following to assist with myasthenia gravis management and medication regimen   ACUTE KIDNEY INJURY/Renal Failure -continue Foley Catheter-assess need -Avoid nephrotoxic agents -Follow urine output, BMP -Ensure adequate renal perfusion, optimize oxygenation -Renal dose medications   Intake/Output Summary (Last 24 hours) at 05/04/2022 0727 Last data filed at 05/04/2022 0600 Gross per 24 hour  Intake 1659.12 ml  Output 3655 ml  Net -1995.88 ml       Latest Ref Rng & Units 05/04/2022    4:49 AM 05/03/2022    3:34 AM 05/02/2022    5:02 AM  BMP  Glucose 70 - 99 mg/dL 137   128   143    BUN 8 - 23 mg/dL 43   39   47    Creatinine 0.61 - 1.24 mg/dL 1.28   1.29   1.30    Sodium 135 - 145 mmol/L 148   148   144    Potassium 3.5 - 5.1 mmol/L 3.8   3.6   3.6    Chloride 98 - 111 mmol/L 111   110   108    CO2 22 - 32  mmol/L 30   32   27    Calcium 8.9 - 10.3 mg/dL 7.3   7.6   7.4      NEUROLOGY -need for sedation -Goal RASS -2 to -3   ENDO - ICU hypoglycemic\Hyperglycemia protocol -check FSBS per protocol   GI GI PROPHYLAXIS as indicated  NUTRITIONAL STATUS DIET-->TF's as tolerated Constipation protocol as indicated   ELECTROLYTES -follow labs as needed -replace as needed -pharmacy consultation and following   Transaminitis in the setting of cardiac arrest    CT Abd Pelvis 05/02/22 revealed no focal liver abnormality is seen. No gallstones, gallbladder wall thickening, or biliary dilatation, gallbladder wall thickening, or biliary dilatation. - Trend hepatic function - Avoid hepatotoxic agents   Best Practice (right click and "Reselect all SmartList Selections" daily)  Diet/type: NPO w/ meds via tube DVT prophylaxis: Heparin gtt GI prophylaxis: PPI Lines: Central line, Arterial Line, and yes and it is still needed Foley:  Yes, and it is still needed Code Status: Limited Code (Mechanical Intubation Only)   Patient's wife at bedside and updated regarding plan of care  multiple times Labs   CBC: Recent Labs  Lab 04/30/22 0332 04/30/22 1734 05/01/22 0431 05/02/22 0502 05/03/22 0334 05/04/22 0449  WBC 12.4* 18.9* 14.6* 13.5* 13.3* 9.2  NEUTROABS 9.4* 15.8*  --   --   --   --   HGB 16.6 16.3 15.3 14.5 14.8 14.1  HCT 57.7* 54.8* 49.2 46.6 47.9 47.2  MCV 104.3* 100.0 95.5 96.5 96.4 99.0  PLT 189 146* 109* 111* 90* 82*     Basic Metabolic Panel: Recent Labs  Lab 04/30/22 1734 04/30/22 1955 05/01/22 1050 05/01/22 1647 05/02/22 0502 05/02/22 1519 05/03/22 0334 05/04/22 0449  NA 141  --  143  --  144  --  148* 148*  K 4.5  --  3.5  --  3.6  --  3.6 3.8  CL 104  --  107  --  108  --  110 111  CO2 26  --  26  --  27  --  32 30  GLUCOSE 170*  --  151*  --  143*  --  128* 137*  BUN 44*  --  52*  --  47*  --  39* 43*  CREATININE 3.18*  --  1.90*  --  1.30*  --  1.29*  1.28*  CALCIUM 7.4*  --  7.2*  --  7.4*  --  7.6* 7.3*  MG  --    < > 2.4 2.4 2.6* 2.5*  --  2.5*  PHOS  --    < > 3.4 3.1 3.0 3.0  --  3.6   < > = values in this interval not displayed.    GFR: Estimated Creatinine Clearance: 58.6 mL/min (A) (by C-G formula based on SCr of 1.28 mg/dL (H)). Recent Labs  Lab 04/30/22 0546 04/30/22 0603 04/30/22 1149 04/30/22 1730 04/30/22 1734 04/30/22 1955  05/01/22 0431 05/02/22 0502 05/03/22 0334 05/04/22 0449  PROCALCITON 0.24  --   --   --   --   --  1.49 0.90  --   --   WBC  --   --   --   --    < >  --  14.6* 13.5* 13.3* 9.2  LATICACIDVEN  --    < > 2.2* 4.2*  --  2.8* 2.7*  --   --   --    < > = values in this interval not displayed.     Liver Function Tests: Recent Labs  Lab 04/30/22 0550 04/30/22 1734 05/02/22 0502 05/03/22 0334  AST 702* 1,750* 434* 336*  ALT 657* 1,349* 972* 1,002*  ALKPHOS 40 38 39 34*  BILITOT 1.4* 1.7* 1.3* 1.3*  PROT 6.0* 5.9* 5.1* 5.7*  ALBUMIN 3.3* 3.1* 2.6* 2.9*    No results for input(s): LIPASE, AMYLASE in the last 168 hours. No results for input(s): AMMONIA in the last 168 hours.  ABG    Component Value Date/Time   PHART 7.42 05/03/2022 1606   PCO2ART 47 05/03/2022 1606   PO2ART 69 (L) 05/03/2022 1606   HCO3 30.5 (H) 05/03/2022 1606   ACIDBASEDEF 1.9 04/30/2022 0804   O2SAT 95 05/03/2022 1606     Coagulation Profile: Recent Labs  Lab 04/30/22 0332 04/30/22 2109  INR 2.1* 3.1*     Cardiac Enzymes: No results for input(s): CKTOTAL, CKMB, CKMBINDEX, TROPONINI in the last 168 hours.  HbA1C: Hemoglobin A1C  Date/Time Value Ref Range Status  12/21/2015 12:00 AM 6.3  Final   Hgb A1c MFr Bld  Date/Time Value Ref Range Status  04/30/2022 05:46 AM 6.9 (H) 4.8 - 5.6 % Final    Comment:    (NOTE) Pre diabetes:          5.7%-6.4%  Diabetes:              >6.4%  Glycemic control for   <7.0% adults with diabetes     CBG: Recent Labs  Lab 05/03/22 1130 05/03/22 1548  05/03/22 1918 05/03/22 2328 05/04/22 0337  GLUCAP 137* 111* 110* 123* 134*      DVT/GI PRX  assessed I Assessed the need for Labs I Assessed the need for Foley I Assessed the need for Central Venous Line Family Discussion when available I Assessed the need for Mobilization I made an Assessment of medications to be adjusted accordingly Safety Risk assessment completed  CASE DISCUSSED IN MULTIDISCIPLINARY ROUNDS WITH ICU TEAM     Critical Care Time devoted to patient care services described in this note is 55 minutes.  Critical care was necessary to treat /prevent imminent and life-threatening deterioration. Overall, patient is critically ill, prognosis is guarded.  Patient with Multiorgan failure and at high risk for cardiac arrest and death.    Corrin Parker, M.D.  [facility]  Medical Director [place] Director [facility] Cardio-Pulmonary Department

## 2022-05-04 NOTE — IPAL (Signed)
  Interdisciplinary Goals of Care Family Meeting   Date carried out: 05/04/2022  Location of the meeting: Bedside  Member's involved: Physician, Bedside Registered Nurse, and Family Member or next of kin    MULTIPLE DISCUSSION TODAY REGARDING  PLAN FOR CARE AND PROGNOSIS  GOALS OF CARE DISCUSSION  The Clinical status was relayed to family in detail-Wife and Daughter at bedside  Updated and notified of patients medical condition- Patient remains unresponsive and will not open eyes to command.   Patient is having a weak cough and struggling to remove secretions.   Patient with increased WOB and using accessory muscles to breathe Explained to family course of therapy and the modalities   Patient with Progressive multiorgan failure with a very high probablity of a very minimal chance of meaningful recovery despite all aggressive and optimal medical therapy.   Family understands the situation but struggling to make final decisions. I explained that patient is suffering and suffocating, They witnessed failed extubation and would NOT want him to suffer any more. They claim that patient would NOT want tubes or TRACH or artifical support and would NOT want to live in a facility.    They have consented and agreed to DNR/DNI and will consider one way extubation and  proceed with Comfort care measures.   Additional CC time 45 mins   Joseph Macdonald Joseph Macdonald, M.D.  [facility]  Medical Director [place] Director [facility] Cardio-Pulmonary Department

## 2022-05-04 NOTE — Progress Notes (Signed)
1510: Assumed care of patient, family bedside. Family rubbing patients chest "in attempt to calm him down." Patient clearly increasingly agitated while stimulated.   1630: Patient in bed resting, family requesting to speak to MD who is currently with another patient. Patient appears stable, when suctioned patient becomes extremely agitated inline secretions bloody.   1700: MD bedside discussing goals and plan of care with family.   1745: Patient family calling for help states "he's choking." Upon entry to room patient extremely agitated, red in the face and reaching for ETT. PRN meds administered (per [facility]). Family remains bedside, oxygen saturation into the mid 80s with bright red secretions. RT called.   1900: Bedside shift report with PM RN. Patient in no obvious distress, vital signs stable,

## 2022-05-04 NOTE — Progress Notes (Addendum)
S: Patient was extubated this AM but unfortunately had to be intubated again shortly after for respiratory failure. No other acute events since yesterday.  O:   Vitals:   05/04/22 0500 05/04/22 0600  BP:    Pulse: 66 64  Resp: 20 20  Temp: 98.8 F (37.1 C) 99 F (37.2 C)  SpO2: 99% 100%   Exam on fentanyl  Gen: lying in bed, sedated Resp: ventilated CV: RRR  Neurologic exam MS: lying in bed, minimally arousable to noxious stimuli Speech: intubated, no attempts to speak CN: pupils 23m ER sluggishly reactive to light, (+) corneals/oculocephalics/cough/gag, face symmetric at rest Motor & sensory: withdraws to noxious stimuli BUE>BLE Reflexes: 1+ symm throughout, toes mute bilat  A/P: 76yo man with hx MG now s/p in hospital arrest 36 hrs ago favored to be 2/2 primary cardiac etiology. His MRI showed no definitive e/o anoxic brain injury. EEG shows continuous background with mild-to-moderate slowing. Patient was briskly following commands yesterday but unfortunately failed extubation today and was reintubated. Given patient's intact extremity strength on exam off sedation yesterday and lack of ptosis or other ocular findings on examination I do not feel that his MG is significantly contributing to his failure to wean from vent. At this point I would not favor empiric tx with IVIG 2/2 the significant volume load associated with administration that would be expected to potentially worsen his CHF - the risks would likely outweigh the benefits. Will continue to follow.  CSu Monks MD [facility] [phone] If 7pm- 7am, please page neurology on call as listed in [place]

## 2022-05-04 NOTE — Progress Notes (Signed)
Daily Progress Note   Patient Name: Joseph Macdonald       Date: 05/04/2022 DOB: November 18, 1946  Age: 76 y.o. MRN#: [id number] Attending Physician: Flora Lipps, MD Primary Care Physician: [name], MD Admit Date: 04/30/2022 Length of Stay: 4 days  Reason for Consultation/Follow-up: Establishing goals of care  HPI/Patient Profile:  76 y.o. male  with past medical history of A-fib on Eliquis, COPD, combined systolic and diastolic CHF, O5DG, HLD, HTN, myasthenia gravis, V. tach.  He presented to the [facility] emergency department in severe respiratory distress and "purple" and before placed on telemetry lost pulses.  He received CPR for 8 minutes with no defibrillations and was emergently intubated for mechanical ventilatory support.  He was admitted on 04/30/2022 with PEA arrest (suspected pulmonary etiology), circulatory shock, acute on chronic combined systolic and diastolic CHF exacerbation, acute hypoxic respiratory failure, AKI in the setting of cardiac arrest, and others.    He was briefly extubated after MRI without evidence of anoxic brain injury but required emergent reintubation shortly thereafter due to left lung white-out from mucus plug s/p bronch removal. Overall prognosis poor.   PMT was consulted for [place] conversations.  Subjective:   Subjective: Chart Reviewed. Updates received. Patient Assessed. Created space and opportunity for patient  and family to explore thoughts and feelings regarding current medical situation.  Today's Discussion: Today I met with the patient's wife and his wife's sister at the bedside.  Daughter and son does not present this morning.  I also spoke with Dr. Mortimer Fries of pulmonary critical care medicine who states he spoke briefly with the family this morning and they indicated their trach and excepted is some point he would need to be extubated.  I spoke with the patient's wife she agreed with this.  We explored that his requirement for reintubation  overall portends a poor outlook.  She agrees that at some point they want to remove the breathing tube with no intention to replace it.  I shared that depending on how he does after extubation he may need comfort care for anticipated peaceful passing.  She was a bit tearful of this but verbalized understanding and agreement.    When I entered the room initially the patient appeared somewhat agitated and his wife trying to call him.  Nurse provided a bolus of medication to help.  She did ask him "you do not like being like this to you?"  The patient responded by shaking his head now.  She explained that she understands he would not want this and for sure would not want to survive indefinitely on life support.  We discussed that we can take it a day at a time and see how he does over the next 24 to 48 hours but sometime in the near future we would need to make a decision about timing of extubation.  She states he has 2 sisters who live in [place] in [place], [place] and that they are coming to visit him tomorrow.  I encouraged family and friends to come visit him should his situation continues to worsen.  We agreed to follow-up again tomorrow and see how he is doing.  I provided emotional and general response to therapeutic listening, empathy, sharing of stories, therapeutic touch, and other techniques.  I answered all questions and addressed all concerns to the best of my ability.  Review of Systems  Unable to perform ROS: Intubated   Objective:   Vital Signs:  BP (!) 187/80  Pulse 68   Temp 99.9 F (37.7 C)   Resp 20   Wt 108.7 kg   SpO2 100%   BMI 37.53 kg/m   Physical Exam: Physical Exam Vitals and nursing note reviewed.  Constitutional:      General: He is not in acute distress.    Appearance: He is ill-appearing.     Interventions: He is sedated and intubated.  HENT:     Head: Normocephalic and atraumatic.  Cardiovascular:     Rate and Rhythm: Normal rate.   Pulmonary:     Effort: Pulmonary effort is normal. No respiratory distress. He is intubated.  Abdominal:     General: Abdomen is protuberant.     Palpations: Abdomen is soft.  Skin:    General: Skin is warm and dry.  Psychiatric:     Comments: Appears anxious    Palliative Assessment/Data: 10% (intubated and on tube feeds)   Assessment & Plan:   Impression: Present on Admission:  Cardiac arrest [facility])  76 year old male with multiple chronic comorbidities presents with acute respiratory distress, chest pain, and subsequent cardiac arrest.  ROSC returned after 8 minutes of CPR.  Remains intubated and sedated on mechanical ventilator.  Does not seem to have much interaction even on wake up assessment.  His echocardiogram also shows significant worsening over the past month, query cardiac etiology such as cardiac ischemia.  His family is aware that he is acutely ill and very critical.  They have elected to make him a DNR.  They share that overall he has not had a great quality of life as of late and he would not want a worsening quality of life.  MRI yesterday with no evidence of anoxic brain injury, patient became more responsive off sedation and was extubated. Required emergent re-intubation after 3 hours and found with left lung whiteout, pending bronchoscopy.  Overall prognosis is poor.  SUMMARY OF RECOMMENDATIONS   Changed to partial code to allow intubation and code drugs ONLY Continue to treat the treatable Next 24-48 hours are critical Anticipate extubation in the near future with no intent to reintubate Timing of extubation pending Allow/encourage family to visit Continued spiritual care Continued emotional support of the patient and family PMT will continue to follow  Symptom Management:  Per primary team PMT is available to assist as needed, especially if the patient transitions to comfort care at some point  Code Status: Limited code  Prognosis: Unable to  determine  Discharge Planning: To Be Determined  Discussed with: Medical team, nursing team, patient's family  Thank you for allowing [place] to participate in the care of Joseph Macdonald PMT will continue to support holistically.  Billing based on MDM: High  Problems Addressed: One acute or chronic illness or injury that poses a threat to life or bodily function  Amount and/or Complexity of Data: Category 3:Discussion of management or test interpretation with external physician/other qualified health care professional/appropriate source (not separately reported)  Risks: Discussions on one-way extubation plans, partial code status and planned DNR after extubation   [name], NP Palliative Medicine Team  Team Phone # [phone] (Nights/Weekends)  08/09/2021, 8:17 AM

## 2022-05-04 NOTE — Progress Notes (Signed)
Nutrition Follow-up  DOCUMENTATION CODES:   Obesity unspecified  INTERVENTION:   -Continue TF via OGT:    Vital High Protein @ 50 ml/hr    45 ml Prosource TF TID.     30 ml free water flush every 4 hours for tube patency   Tube feeding regimen provides 1320 kcal (100% of needs), 138 grams of protein, and 1003 ml of H2O. Total free water: 1183 ml daily  NUTRITION DIAGNOSIS:   Inadequate oral intake related to inability to eat as evidenced by NPO status.  Ongoing  GOAL:   Provide needs based on ASPEN/SCCM guidelines  Met with TF  MONITOR:   TF tolerance, Vent status  REASON FOR ASSESSMENT:   Ventilator    ASSESSMENT:   Pt presented with cardiac arrest. PMH HTN, HLD, MG, COPD, and arthritis.  5/21- intubated 5/22- TF initiated 5/23- TF stopped secondary to tan secretions coming out of tube 5/24- extubated, re-intubated, TF resumed. S/p bronch   Patient is currently intubated on ventilator support MV: 10.3 L/min Temp (24hrs), Avg:98.5 F (36.9 C), Min:96.6 F (35.9 C), Max:100.4 F (38 C)  Reviewed I/O's: -1.8 L x 24 hours and -1.5 L since admission  UOP: 3.7 L x 24 hours  Case discussed with RN, MD, and during ICU rounds. Pt re-intubated yesterday. He is not requiring pressor support and has good UOP. He is currently on 100% oxygen support. He remains agitated to touch. TF were resumed yesterday and are at goal. Per RN, pt tolerating TF without issue.   Per palliative care notes, pt now partial code. Plan to treat the treatable within the next 24-48 hours with possible one way extubation.   Medications reviewed and include colace, lasix, and miralax.   Labs reviewed: Na: 148, CBGS: 123-179 (inpatient orders for glycemic control are 0-20 units insulin aspart every 4 hours).    Diet Order:   Diet Order             Diet NPO time specified  Diet effective now                   EDUCATION NEEDS:   No education needs have been identified at this  time  Skin:  Skin Assessment: Skin Integrity Issues: Skin Integrity Issues:: Other (Comment) Other: skin tear to rt lower arm, rt posterior elbow  Last BM:  Unknown  Height:   Ht Readings from Last 1 Encounters:  04/03/22 _0  (1.702 m)    Weight:   Wt Readings from Last 1 Encounters:  05/02/22 108.7 kg    Ideal Body Weight:  67.3 kg  BMI:  Body mass index is 37.53 kg/m.  Estimated Nutritional Needs:   Kcal:  3276-1470  Protein:  > 135 grams  Fluid:  > 1.2 L    Loistine Chance, RD, LDN, [place] Registered Dietitian II Certified Diabetes Care and Education Specialist Please refer to [facility] for RD and/or RD on-call/weekend/after hours pager

## 2022-05-04 NOTE — Progress Notes (Signed)
ANTICOAGULATION CONSULT NOTE  Pharmacy Consult for IV Heparin Indication: atrial fibrillation  Patient Measurements: Weight:  (UTA bed error) Heparin Dosing Weight: 90 kg  Labs: Recent Labs    05/02/22 0502 05/02/22 1519 05/02/22 2341 05/03/22 0334 05/03/22 0830 05/04/22 0449  HGB 14.5  --   --  14.8  --  14.1  HCT 46.6  --   --  47.9  --  47.2  PLT 111*  --   --  90*  --  82*  APTT 60*   < > 75*  --  87* 91*  HEPARINUNFRC >1.10*  --  >1.10*  --  1.02* 0.84*  CREATININE 1.30*  --   --  1.29*  --  1.28*   < > = values in this interval not displayed.    Estimated Creatinine Clearance: 58.6 mL/min (A) (by C-G formula based on SCr of 1.28 mg/dL (H)).   Medical History: Past Medical History:  Diagnosis Date   Arthritis    COPD (chronic obstructive pulmonary disease) ([place])    Diabetes mellitus without complication (HCC)    diet controlled   Dyspnea    with activity   Hyperlipidemia    Hypertension    Myasthenia gravis ([place])    Oxygen deficit    Ventricular tachycardia [facility])    Assessment: 76 yo M w/ PMH of atrial fibrillation, COPD, CHF, DM, HLD, HTN presenting to [facility] ED via EMS from home on 04/30/22 in respiratory distress. Prior to admission he was receiving apixaban with last dose unknown. Baseline INR 2.1, H&H/PLT wnl, LFTs elevated  Goal of Therapy:  Heparin level 0.3-0.7 units/ml aPTT 66 - 102 seconds Monitor platelets by anticoagulation protocol: Yes   Results Date/time HL/aPTT Comments 5/21 2109 aPTT 110 Suprather; 1300 un/hr 5/22 0431 aPTT 98 Thera x 1; 1200 un/hr 5/22 1210 aPTT 83 Thera x 2, 1200 un/hr 5/23 0502       aPTT 60     Subthera; 1200 un/hr  5/23 1519 aPTT 48 Subthera, 1400 un/hr 5/23 2341       aPTT 75 Thera x 1; 1650 un/hr 5/24 0830 aPTT 87 Thera x 2; 1650 un/hr 5/25 0449       aPTT 91          Thera X 3; 1650 units/hr  Plan:  5/25 @ 0449:  aPTT = 91, HL = 0.84 aPTT is therapeutic but HL still remains elevated.  Will continue pt on  current rate and recheck aPTT and HL on 5/26 with AM labs.   Jacquilyn Seldon D 05/04/2022 6:20 AM

## 2022-05-04 NOTE — Progress Notes (Signed)
Progress Note  Patient Name: Joseph Macdonald Date of Encounter: 05/04/2022  [facility] Cardiologist: [name], MD   Subjective   Patient seen on AM rounds. Extubated yesterday but had increased work of breathing that required re-intubation. Had bronch done yesterday that removed bloody mucus plugs. Remains on heparin drip and low doses of fentanyl. Family remains at the bedside. Remains in sinus. -2.7 liters out in the last 24 hours.  Inpatient Medications    Scheduled Meds:  budesonide (PULMICORT) nebulizer solution  0.25 mg Nebulization BID   chlorhexidine gluconate (MEDLINE KIT)  15 mL Mouth Rinse BID   Chlorhexidine Gluconate Cloth  6 each Topical Daily   docusate  100 mg Per Tube BID   feeding supplement (PROSource TF)  45 mL Per Tube TID   feeding supplement (VITAL HIGH PROTEIN)  1,000 mL Per Tube Q24H   free water  30 mL Per Tube Q4H   furosemide  40 mg Intravenous BID   insulin aspart  0-20 Units Subcutaneous Q4H   ipratropium-albuterol  3 mL Nebulization Q6H   mouth rinse  15 mL Mouth Rinse 10 times per day   methylPREDNISolone (SOLU-MEDROL) injection  20 mg Intravenous BID   pantoprazole (PROTONIX) IV  40 mg Intravenous Daily   polyethylene glycol  17 g Per Tube Daily   Continuous Infusions:  fentaNYL infusion INTRAVENOUS 125 mcg/hr (05/04/22 1000)   heparin 1,650 Units/hr (05/04/22 1000)   midazolam     piperacillin-tazobactam (ZOSYN)  IV     PRN Meds: atropine, docusate, fentaNYL (SUBLIMAZE) injection, fentaNYL (SUBLIMAZE) injection, hydrALAZINE, midazolam, polyethylene glycol   Vital Signs    Vitals:   05/04/22 0738 05/04/22 0800 05/04/22 0900 05/04/22 1000  BP:      Pulse:  68 73 81  Resp:  _0 Temp:  99.3 F (37.4 C)  99.3 F (37.4 C)  TempSrc:  Esophageal    SpO2: 95% 94% 92% 92%  Weight:        Intake/Output Summary (Last 24 hours) at 05/04/2022 1105 Last data filed at 05/04/2022 1000 Gross per 24 hour  Intake 1671.86 ml   Output 3955 ml  Net -2283.14 ml      05/02/2022    4:57 AM 05/01/2022    4:33 AM  Last 3 Weights  Weight (lbs) 239 lb 10.2 oz 239 lb 3.2 oz  Weight (kg) 108.7 kg 108.5 kg      Telemetry    SR rate 60-70, ST depression with inverted T waves- Personally Reviewed  ECG    No recent studies completed  Physical Exam   GEN: No acute distress. On the ventilator without agitation during rounds. Neck: No JVD Cardiac: RRR, no murmurs, rubs, or gallops.  Respiratory: Rhonchi noted throughout, respirations are vent assisted, and unlabored GI: Soft, nontender, non-distended , obese, bowel sounds +  MS: 1-2+ pitting edema to bilateral lower extremities; No deformity. Neuro:  Unable to assess Psych: Unable to assess  Labs    High Sensitivity Troponin:   Recent Labs  Lab 04/30/22 0332 04/30/22 0546 04/30/22 1734 04/30/22 1955  TROPONINIHS 373* 1,137* 2,844* 2,210*     Chemistry Recent Labs  Lab 04/30/22 1734 04/30/22 1955 05/02/22 0502 05/02/22 1519 05/03/22 0334 05/04/22 0449  NA 141   < > 144  --  148* 148*  K 4.5   < > 3.6  --  3.6 3.8  CL 104   < > 108  --  110 111  CO2 26   < >  27  --  32 30  GLUCOSE 170*   < > 143*  --  128* 137*  BUN 44*   < > 47*  --  39* 43*  CREATININE 3.18*   < > 1.30*  --  1.29* 1.28*  CALCIUM 7.4*   < > 7.4*  --  7.6* 7.3*  MG  --    < > 2.6* 2.5*  --  2.5*  PROT 5.9*  --  5.1*  --  5.7*  --   ALBUMIN 3.1*  --  2.6*  --  2.9*  --   AST 1,750*  --  434*  --  336*  --   ALT 1,349*  --  972*  --  1,002*  --   ALKPHOS 38  --  39  --  34*  --   BILITOT 1.7*  --  1.3*  --  1.3*  --   GFRNONAA 20*   < > 57*  --  58* 58*  ANIONGAP 11   < > 9  --  6 7   < > = values in this interval not displayed.    Lipids No results for input(s): CHOL, TRIG, HDL, LABVLDL, LDLCALC, CHOLHDL in the last 168 hours.  Hematology Recent Labs  Lab 05/02/22 0502 05/03/22 0334 05/04/22 0449  WBC 13.5* 13.3* 9.2  RBC 4.83 4.97 4.77  HGB 14.5 14.8 14.1  HCT  46.6 47.9 47.2  MCV 96.5 96.4 99.0  MCH 30.0 29.8 29.6  MCHC 31.1 30.9 29.9*  RDW 13.5 13.8 14.1  PLT 111* 90* 82*   Thyroid  Recent Labs  Lab 04/30/22 0546  TSH 0.441  FREET4 0.88    BNP Recent Labs  Lab 04/30/22 0332  BNP 971.6*    DDimer No results for input(s): DDIMER in the last 168 hours.   Radiology    CT ABDOMEN PELVIS WO CONTRAST  Result Date: 05/02/2022 CLINICAL DATA:  Bowel obstruction suspected. EXAM: CT ABDOMEN AND PELVIS WITHOUT CONTRAST TECHNIQUE: Multidetector CT imaging of the abdomen and pelvis was performed following the standard protocol without IV contrast. RADIATION DOSE REDUCTION: This exam was performed according to the departmental dose-optimization program which includes automated exposure control, adjustment of the mA and/or kV according to patient size and/or use of iterative reconstruction technique. COMPARISON:  CT examination dated June 10, 2019 FINDINGS: Lower chest: Small bilateral pleural effusions with associated atelectasis, right greater than the left. Hepatobiliary: No focal liver abnormality is seen. No gallstones, gallbladder wall thickening, or biliary dilatation. Pancreas: Moderate pancreatic atrophy. No pancreatic ductal dilatation or surrounding inflammatory changes. Spleen: Normal in size without focal abnormality. Calcified granuloma in the spleen is noted. Adrenals/Urinary Tract: Adrenal glands are unremarkable. Kidneys are normal, without renal calculi, focal lesion, or hydronephrosis. Nonspecific bilateral perinephric fat stranding. Urinary bladder is collapsed about the [facility] catheter. Stomach/Bowel: Stomach is within normal limits. NG tube with distal tip in the body of the stomach. Appendix appears normal. No evidence of bowel wall thickening, distention, or inflammatory changes. Scattered colonic diverticuli without evidence of acute diverticulitis. Vascular/Lymphatic: Aortic atherosclerosis. No enlarged abdominal or pelvic lymph nodes.  Right femoral access central line. Reproductive: Prostate is unremarkable. Other: No abdominal wall hernia or abnormality. No abdominopelvic ascites. Mild generalized anasarca suggesting fluid overload. 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 IMPRESSION: 1. Bowel loops are normal in caliber. Scattered colonic diverticula without evidence of acute diverticulitis. 2. Small bilateral pleural effusions with associated atelectasis,  right greater than the left. 3.  Mild generalized anasarca. 4.  No CT evidence of acute abdominal/pelvic process. 5.  Additional chronic findings as above. Electronically Signed   By: Keane Police D.O.   On: 05/02/2022 14:32   MR BRAIN WO CONTRAST  Result Date: 05/02/2022 CLINICAL DATA:  Anoxic brain injury EXAM: MRI HEAD WITHOUT CONTRAST TECHNIQUE: Multiplanar, multiecho pulse sequences of the brain and surrounding structures were obtained without intravenous contrast. COMPARISON:  CT head 05/01/2022 FINDINGS: Brain: Negative for acute infarct. No evidence of acute anoxic injury. Ventricle size normal. Moderate white matter changes with patchy hyperintensity throughout the white matter. Mild hyperintensity in the pons. Negative for hemorrhage or mass. Vascular: Normal arterial flow voids. Skull and upper cervical spine: Negative Sinuses/Orbits: Paranasal sinuses clear. Other: None IMPRESSION: No acute abnormality.  Negative for acute anoxic brain injury Moderate chronic microvascular ischemic change in the white matter and pons. Electronically Signed   By: Franchot Gallo M.D.   On: 05/02/2022 14:20   DG Chest Port 1 View  Result Date: 05/04/2022 CLINICAL DATA:  Hypoxia. EXAM: PORTABLE CHEST 1 VIEW COMPARISON:  Prior chest radiographs 05/03/2022 and earlier. FINDINGS: ET tube present with tip terminating 6 cm above the level of the carina. Esophageal temperature probe. An enteric tube passes below level  of left hemidiaphragm and coils within the stomach. Cardiomegaly. Aortic atherosclerosis. Persistent opacity within the left mid to lower lung field, which may reflect a pleural effusion, atelectasis and/or consolidation. No appreciable airspace consolidation within the right lung. No evidence of pneumothorax. No acute bony abnormality identified. IMPRESSION: Support apparatus, as described. Persistent opacity within the left mid to lower lung field, which may reflect a pleural effusion, atelectasis and/or consolidation. Cardiomegaly. Aortic Atherosclerosis (ICD10-I70.0). Electronically Signed   By: Kellie Simmering D.O.   On: 05/04/2022 10:30   DG Chest Port 1 View  Result Date: 05/03/2022 CLINICAL DATA:  Respiratory failure EXAM: PORTABLE CHEST 1 VIEW COMPARISON:  05/03/2022 FINDINGS: Endotracheal tube, enteric tube, and temperature probe are unchanged in position since the previous study. Shallow inspiration. Cardiac enlargement without vascular congestion. Improved aeration of the left lung with mild residual atelectasis or effusion in the lower left chest. Significant improvement of aeration in the left upper lung. Right lung is clear and expanded. IMPRESSION: Appliances appear in satisfactory position. Improved aeration of the left lung with residual basilar atelectasis and small effusion. Electronically Signed   By: Lucienne Capers M.D.   On: 05/03/2022 18:01   DG Chest Port 1 View  Result Date: 05/03/2022 CLINICAL DATA:  Status post intubation. EXAM: PORTABLE CHEST 1 VIEW COMPARISON:  05/03/2022 FINDINGS: The ET tube tip is identified 2.7 cm above the carina. There is a OG tube with tip in the left upper quadrant of the abdomen in the expected location of the gastric fundus. Interval complete opacification of the left hemithorax which is suspected to represent mucous plugging. Mild interstitial edema identified in the right lung. IMPRESSION: 1. Interval complete opacification of the left hemithorax,  suspected to represent mucous plugging. 2. Satisfactory position of the ET tube and OG tube. Electronically Signed   By: Kerby Moors M.D.   On: 05/03/2022 13:36   DG Chest Port 1 View  Result Date: 05/03/2022 CLINICAL DATA:  Acute respiratory failure with hypoxia. EXAM: PORTABLE CHEST 1 VIEW COMPARISON:  Apr 30, 2022. FINDINGS: Stable cardiomegaly. Nasogastric tube tip is seen in the proximal stomach. Bilateral interstitial densities are noted concerning for pulmonary edema. Left pleural effusion is  noted. Minimal right basilar subsegmental atelectasis is noted. Bony thorax is unremarkable. IMPRESSION: Probable bilateral pulmonary edema. Mild left pleural effusion is noted. Minimal right basilar subsegmental atelectasis is noted. Electronically Signed   By: Marijo Conception M.D.   On: 05/03/2022 07:58    Cardiac Studies  Echocardiogram completed on 04/30/2022 1. Left ventricular ejection fraction, by estimation, is 35 to 40%. The  left ventricle has moderately decreased function. The left ventricle  demonstrates global hypokinesis. There is mild left ventricular  hypertrophy. Left ventricular diastolic  parameters are consistent with Grade I diastolic dysfunction (impaired  relaxation).   2. Right ventricular systolic function is moderately reduced. The right  ventricular size is mildly enlarged.   3. The mitral valve is normal in structure. No evidence of mitral valve  regurgitation.   4. The aortic valve is grossly normal. Aortic valve regurgitation is not  visualized.   Patient Profile     76 y.o. male with a history of HFpEF, essential hypertension, VT, PAF, hyperlipidemia, COPD, myasthenia gravis who presented to the hospital due worsening shortness of breath and severe respiratory distress. He developed PEA arrest upon presentations with 8 minutes of CPR without any known shockable rhythm.  Assessment & Plan    PEA cardiac arrest likely due to pulmonary etiology - HS troponins  slightly elevated without evidence os acute coronary syndrome - Echocardiogram completed with a drop in EF noted likely from myocardial stunning, revisit limited study as mentation improves - No plans for extubation today  2. Acute on chronic systolic congestive heart failure - Continue lasix 40 mg IVP BID - LVEF dropped to 35# with previous results of 45% - GDMT remains on hold due to elevated kidney function - Will revisit R/LHC with mentation improvements and possible extubation - Daily weight, strict I & O  3. Paroxysmal atrial fibrillation - Continue heparin drip - Remains in SR  - Flecainide discontinued due to drop in LVEF and worsening renal function     For questions or updates, please contact [facility] Please consult [email or web address] for contact info under        Signed, Taelyr Jantz, NP  05/04/2022, 11:05 AM

## 2022-05-04 NOTE — Consult Note (Signed)
PHARMACY CONSULT NOTE  Pharmacy Consult for Electrolyte Monitoring and Replacement   Recent Labs: Potassium (mmol/L)  Date Value  05/04/2022 3.8   Magnesium (mg/dL)  Date Value  05/04/2022 2.5 (H)   Calcium (mg/dL)  Date Value  05/04/2022 7.3 (L)   Albumin (g/dL)  Date Value  05/03/2022 2.9 (L)  04/03/2022 4.1   Phosphorus (mg/dL)  Date Value  05/04/2022 3.6   Sodium (mmol/L)  Date Value  05/04/2022 148 (H)  04/03/2022 149 (H)   Assessment: Patient is a 76 y/o M with medical history including Afib on Eliquis, COPD, combined systolic and diastolic CHF, U8EK, HLD, HTN, myasthenia gravis, ventricular tachycardia who presented to the ED 5/21 early AM with chest pain and respiratory distress. Patient experienced cardiac arrest with attainment of ROSC after 8 minutes of CPR. Patient is currently intubated, sedated, and on mechanical ventilation in the ICU. Pharmacy consulted to assist with electrolyte monitoring and replacement as indicated.  Extubated 5/24, re-intubated 5/24  Nutrition: Tube feeds  Diuresis: IV Lasix 40 mg BID  Scr stable; 1.28  Goal of Therapy:  Electrolytes within normal limits  Plan:  --Na 148, continue to monitor while on diuresis --K 3.8, Kcl 40 mEq per tube x 1 --Ca 7.3, no albumin available today. Consider IV calcium gluconate 1 g x 1 dose today if clinically indicated (e.g. bradycardia) --Follow-up electrolytes with AM labs tomorrow --Avoid IV magnesium if possible given history of myasthenia gravis  [name] 05/04/2022 7:57 AM

## 2022-05-05 DIAGNOSIS — Z66 Do not resuscitate: Secondary | ICD-10-CM | POA: Insufficient documentation

## 2022-05-05 DIAGNOSIS — I420 Dilated cardiomyopathy: Secondary | ICD-10-CM | POA: Diagnosis not present

## 2022-05-05 DIAGNOSIS — Z7189 Other specified counseling: Secondary | ICD-10-CM | POA: Insufficient documentation

## 2022-05-05 DIAGNOSIS — J810 Acute pulmonary edema: Secondary | ICD-10-CM | POA: Diagnosis not present

## 2022-05-05 DIAGNOSIS — Z515 Encounter for palliative care: Secondary | ICD-10-CM | POA: Diagnosis not present

## 2022-05-05 DIAGNOSIS — J432 Centrilobular emphysema: Secondary | ICD-10-CM

## 2022-05-05 DIAGNOSIS — I469 Cardiac arrest, cause unspecified: Secondary | ICD-10-CM | POA: Diagnosis not present

## 2022-05-05 DIAGNOSIS — J989 Respiratory disorder, unspecified: Secondary | ICD-10-CM | POA: Diagnosis not present

## 2022-05-05 DIAGNOSIS — I5020 Unspecified systolic (congestive) heart failure: Secondary | ICD-10-CM | POA: Diagnosis not present

## 2022-05-05 DIAGNOSIS — I255 Ischemic cardiomyopathy: Secondary | ICD-10-CM | POA: Diagnosis not present

## 2022-05-05 LAB — BASIC METABOLIC PANEL
Anion gap: 9 (ref 5–15)
BUN: 50 mg/dL — ABNORMAL HIGH (ref 8–23)
CO2: 28 mmol/L (ref 22–32)
Calcium: 7.9 mg/dL — ABNORMAL LOW (ref 8.9–10.3)
Chloride: 109 mmol/L (ref 98–111)
Creatinine, Ser: 1.1 mg/dL (ref 0.61–1.24)
GFR, Estimated: >60 mL/min (ref >60)
Glucose, Bld: 212 mg/dL — ABNORMAL HIGH (ref 70–99)
Potassium: 3.9 mmol/L (ref 3.5–5.1)
Sodium: 146 mmol/L — ABNORMAL HIGH (ref 135–145)

## 2022-05-05 LAB — MAGNESIUM: Magnesium: 2.5 mg/dL — ABNORMAL HIGH (ref 1.7–2.4)

## 2022-05-05 LAB — CBC
HCT: 45.5 % (ref 39.0–52.0)
Hemoglobin: 13.7 g/dL (ref 13.0–17.0)
MCH: 29.6 pg (ref 26.0–34.0)
MCHC: 30.1 g/dL (ref 30.0–36.0)
MCV: 98.3 fL (ref 80.0–100.0)
Platelets: 84 10*3/uL — ABNORMAL LOW (ref 150–400)
RBC: 4.63 MIL/uL (ref 4.22–5.81)
RDW: 14.1 % (ref 11.5–15.5)
WBC: 9.4 10*3/uL (ref 4.0–10.5)
nRBC: 0 % (ref 0.0–0.2)

## 2022-05-05 LAB — PHOSPHORUS: Phosphorus: 3.7 mg/dL (ref 2.5–4.6)

## 2022-05-05 LAB — APTT: aPTT: 103 seconds — ABNORMAL HIGH (ref 24–36)

## 2022-05-05 LAB — GLUCOSE, CAPILLARY
Glucose-Capillary: 198 mg/dL — ABNORMAL HIGH (ref 70–99)
Glucose-Capillary: 234 mg/dL — ABNORMAL HIGH (ref 70–99)
Glucose-Capillary: 173 mg/dL — ABNORMAL HIGH (ref 70–99)
Glucose-Capillary: 165 mg/dL — ABNORMAL HIGH (ref 70–99)
Glucose-Capillary: 165 mg/dL — ABNORMAL HIGH (ref 70–99)
Glucose-Capillary: 128 mg/dL — ABNORMAL HIGH (ref 70–99)

## 2022-05-05 LAB — HEPARIN LEVEL (UNFRACTIONATED): Heparin Unfractionated: 0.84 IU/mL — ABNORMAL HIGH (ref 0.30–0.70)

## 2022-05-05 MED ORDER — DIAZEPAM 5 MG/ML IJ SOLN
5.0000 mg | Freq: Three times a day (TID) | INTRAMUSCULAR | Status: DC
Start: 2022-05-05 — End: 2022-05-08
  Administered 2022-05-05 – 2022-05-08 (×7): 5 mg via INTRAVENOUS
  Filled 2022-05-05 (×7): qty 2

## 2022-05-05 MED ORDER — DIAZEPAM 5 MG/ML IJ SOLN
5.0000 mg | INTRAMUSCULAR | Status: DC | PRN
  Administered 2022-05-05: 5 mg via INTRAVENOUS
  Administered 2022-05-06: 2.5 mg via INTRAVENOUS
  Administered 2022-05-06 – 2022-05-07 (×3): 5 mg via INTRAVENOUS
  Filled 2022-05-05 (×5): qty 2

## 2022-05-05 MED ORDER — HYDROMORPHONE HCL 1 MG/ML IJ SOLN
0.5000 mg | INTRAMUSCULAR | Status: DC | PRN
  Administered 2022-05-05 – 2022-05-09 (×11): 0.5 mg via INTRAVENOUS
  Filled 2022-05-05 (×12): qty 1

## 2022-05-05 MED ORDER — APIXABAN 5 MG PO TABS
5.0000 mg | ORAL_TABLET | Freq: Two times a day (BID) | ORAL | Status: DC
  Administered 2022-05-05 (×2): 5 mg
  Filled 2022-05-05 (×2): qty 1

## 2022-05-05 MED ORDER — BISACODYL 10 MG RE SUPP
10.0000 mg | Freq: Once | RECTAL | Status: AC
  Administered 2022-05-05: 10 mg via RECTAL
  Filled 2022-05-05: qty 1

## 2022-05-05 MED ORDER — HYDROMORPHONE HCL 1 MG/ML IJ SOLN
1.0000 mg | Freq: Once | INTRAMUSCULAR | Status: AC
  Administered 2022-05-05: 1 mg via INTRAVENOUS
  Filled 2022-05-05: qty 1

## 2022-05-05 MED ORDER — OLANZAPINE 5 MG PO TBDP
5.0000 mg | ORAL_TABLET | Freq: Two times a day (BID) | ORAL | Status: DC
Start: 2022-05-05 — End: 2022-05-18
  Administered 2022-05-05 – 2022-05-18 (×26): 5 mg via ORAL
  Filled 2022-05-05 (×27): qty 1

## 2022-05-05 NOTE — Progress Notes (Signed)
NAME:  Joseph Macdonald, MRN:  [id number], DOB:  Jul 01, 1946, LOS: 5 ADMISSION DATE:  04/30/2022, CONSULTATION DATE:  04/30/22 REFERRING MD:  Dr. Alfred Levins, CHIEF COMPLAINT:  Respiratory distress   History of Present Illness:  76 yo M presenting to [facility] ED via EMS from home on 04/30/22 in respiratory distress. Per documentation patient was found by EMS hypoxic with SpO2 71%-88% placed on NRB with albuterol administered. Family reports the patient was in his normal state of health but had NOT been taking his Lasix prescription. They believe he was taking the rest of his medication as prescribed but did not take his nighttime medications on Saturday. Family denies fever/chills, vomiting, slurred speech, weakness. They did report he had some nausea, one episode of diarrhea and mild nasal congestion as well as a non productive cough. They report he has chronic shortness of breath which started to appear more severe on Saturday.  They confirmed that the patient does not wear oxygen at home. Per report he quit smoking in 2018 after a 50 pack year history, drinks ETOH occasionally and has no recreational drug use history.   ED course: Upon arrival, per EDP report the patient was in severe respiratory distress and "purple". Before they were able to intervene, patient lost pulses. He was not on telemetry yet and it was unclear what was the initial rhythm. Patient received CPR for 8 minutes no defibrillations, and was emergently intubated requiring mechanical ventilatory support. Upon intubation, EDP noted frothy respiratory secretions. ROSC obtained and patient extremely hypertensive with SBP in the 200's. Lasix given and propofol started. SBP subsequently dropped into the 60's, propofol stopped and levophed started. EKG showing some inferolateral STE, Dr. Alfred Levins spoke with STEMI cardiologist Dr. Fletcher Anon who reviewed EKG and determined the patient did not meet STEMI criteria. Medications given: 80 mg Lasix IV,  propofol started then stopped, levophed drip started  Significant labs: (Labs/ Imaging personally reviewed) I, Domingo Pulse [place], AGACNP-BC, personally viewed and interpreted this ECG. EKG Interpretation: Date: 04/30/22, EKG Time: 03:38, Rate: 63, Rhythm: NSR, QRS Axis:  LAD, Intervals: 1st degree HB, ST/T Wave abnormalities: Inferolateral STE, non specific T wave inversions , Narrative Interpretation: NSR with 1st dgree HB & LAD with inferolateral STE that does not meet STEMI criteria per cardiology review Chemistry: pending Hematology: mild leukocytosis WBC: 12.4, Hgb: 16.6,  Troponin: 373, BNP: 971.6, lactic/PCT: pending,  COVID-19 & Influenza A/B: pending ABG: pending CXR 04/30/22: Mild interstitial pulmonary edema  PCCM consulted for admission due to acute hypoxic respiratory failure and subsequent cardiac arrest with circulatory shock requiring mechanical ventilation and vasopressor support.  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 5/22 remains intubated, on vent.  Code Status changed to DNR.  Repeat CTH negative, EEG without evidence of seizures 5/23: Patient withdraws from pain in all extremities with intermittent agitation, plan for MRI Brain & CT Abdomen 5/24: WUA with plans for extubation 5/24 failed extubation, LEFT lung white out on CXR 5/24  re-intubated, severe hypoxia, s/p  bronch removed bloody mucus plugs 5/25 remains on vent 5/26 stable on vent overnight  Interim History / Subjective:  Severe hypoxia Failed extubation 5/24 Remains critically ill Prognosis is very poor  Vent Mode: PRVC FiO2 (%):  [28 %-50 %] 28 % Set Rate:  [20 bmp] 20 bmp Vt Set:  [500 mL] 500 mL PEEP:  [5 cmH20-10 cmH20] 5 cmH20    Objective   Blood pressure (!) 187/80, pulse 67, temperature 99.5 F (37.5 C), resp. rate 20, weight 108.7 kg, SpO2 92 %.    Vent Mode: PRVC FiO2 (%):  [28 %-50 %] 28 % Set Rate:  [20 bmp] 20 bmp Vt Set:  [500 mL] 500 mL PEEP:  [5 cmH20-10 cmH20] 5 cmH20   Intake/Output Summary (Last 24 hours) at 05/05/2022 1140 Last data filed at 05/05/2022 1136 Gross per 24 hour  Intake 1095.02 ml  Output 5125 ml  Net -4029.98 ml    Filed Weights   05/01/22 0433 05/02/22 0457  Weight: 108.5 kg 108.7 kg    REVIEW OF SYSTEMS  PATIENT IS UNABLE TO PROVIDE COMPLETE REVIEW OF SYSTEMS DUE TO SEVERE CRITICAL ILLNESS AND TOXIC METABOLIC ENCEPHALOPATHY    PHYSICAL EXAMINATION:  GENERAL:critically ill appearing, +resp distress EYES: Pupils equal, round, reactive to light.  No scleral icterus.  MOUTH: Moist mucosal membrane. INTUBATED NECK: Supple.  PULMONARY: +rhonchi, +wheezing CARDIOVASCULAR: S1 and S2.  No murmurs  GASTROINTESTINAL: Soft, nontender, -distended. Positive bowel sounds.  MUSCULOSKELETAL: No swelling, clubbing, or edema.  NEUROLOGIC: obtunded SKIN:intact,warm,dry     Assessment & Plan:  76 yo white male with  PEA Cardiac arrest: suspect pulmonary etiology due to pneumonia with Elevated Troponin in setting of Demand Ischemia /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 Paroxsymal Atrial Fibrillation failed trial of extubation due to lung collapse and inability to protect airway with severe resp insufficiency with underlying MG   Severe ACUTE Hypoxic and Hypercapnic  Respiratory Failure -continue Mechanical Ventilator support -Wean Fio2 and PEEP as tolerated -VAP/VENT bundle implementation - Wean PEEP & FiO2 as tolerated, maintain SpO2 > 88% - Head of bed elevated 30 degrees, VAP protocol in place - Plateau pressures less than 30 cm H20  - Intermittent chest x-ray & ABG PRN - Ensure adequate pulmonary hygiene  - If sedation is weaned, modify vent mode to patient comfort (PCV-auto, etc)  ACUTE SYSTOLIC CARDIAC FAILURE- EF 20% 8 minutes downtime, CPR initiated immediately, received no defibrillations (deemed not a candidate for hypothermia protocol) Patient neurologically recovered -ETT/MV -Allow to re-equilibrate off lasix today, -6L+ since admit -follow up cardiac enzymes as indicated -Continue Heparin gtt -Cardiology following, appreciate input -Diuresis as BP and renal function permits -Echocardiogram 5/21: LVEF 35-40%, Grade I DD, RV systolic function moderately reduced   Myasthenia Gravis query crisis -Neurology following to assist with myasthenia gravis management and medication regimen   ACUTE KIDNEY INJURY/Renal Failure -continue Foley Catheter-assess need -Avoid nephrotoxic agents -Follow urine output, BMP -Ensure adequate renal perfusion, optimize oxygenation -Renal dose medications   Intake/Output Summary (Last 24 hours) at 05/05/2022 1140 Last data filed at 05/05/2022 1136 Gross per 24 hour  Intake 1095.02 ml  Output 5125 ml  Net -4029.98 ml   Net IO Since Admission: -6,023.71 mL [05/05/22 1153]      Latest Ref Rng & Units 05/05/2022    4:46 AM 05/04/2022    4:49 AM 05/03/2022    3:34 AM  BMP  Glucose 70 - 99 mg/dL 212   137   128  BUN 8 - 23 mg/dL 50   43   39    Creatinine 0.61 - 1.24 mg/dL 1.10   1.28   1.29    Sodium 135 - 145 mmol/L 146   148   148    Potassium 3.5 - 5.1 mmol/L 3.9   3.8   3.6    Chloride 98 - 111 mmol/L 109   111   110    CO2 22 - 32 mmol/L 28   30   32    Calcium 8.9 - 10.3 mg/dL 7.9   7.3    7.6      NEUROLOGY -need for sedation -Goal RASS zero -Change to prn Dilaudid and prn Valium  -Add Zyprexa  ENDO - ICU hypoglycemic\Hyperglycemia protocol -check FSBS per protocol   GI GI PROPHYLAXIS as indicated  NUTRITIONAL STATUS DIET-->TF's as tolerated Constipation protocol as indicated   ELECTROLYTES -follow labs as needed -replace as needed -pharmacy consultation and following   Transaminitis in the setting of cardiac arrest   Lab Results  Component Value Date   AST 336 (H) 05/03/2022   AST 434 (H) 05/02/2022   AST 1,750 (H) 04/30/2022   Lab Results  Component Value Date   ALT 1,002 (H) 05/03/2022   ALT 972 (H) 05/02/2022   ALT 1,349 (H) 04/30/2022     CT Abd Pelvis 05/02/22 revealed no focal liver abnormality is seen. No gallstones, gallbladder wall thickening, or biliary dilatation, gallbladder wall thickening, or biliary dilatation. - Trend hepatic function - Avoid hepatotoxic agents   Best Practice (right click and "Reselect all SmartList Selections" daily)  Diet/type: NPO w/ meds via tube DVT prophylaxis: Heparin gtt GI prophylaxis: PPI Lines: Central line, Arterial Line, and yes and it is still needed Foley:  Yes, and it is still needed Code Status: Limited Code (Mechanical Intubation Only)   Patient's wife at bedside and updated regarding plan of care  multiple times Labs   CBC: Recent Labs  Lab 04/30/22 0332 04/30/22 1734 05/01/22 0431 05/02/22 0502 05/03/22 0334 05/04/22 0449 05/05/22 0446  WBC 12.4* 18.9* 14.6* 13.5* 13.3* 9.2 9.4  NEUTROABS 9.4* 15.8*  --   --   --   --   --   HGB 16.6 16.3 15.3 14.5 14.8 14.1 13.7  HCT 57.7* 54.8* 49.2 46.6 47.9 47.2 45.5  MCV 104.3* 100.0 95.5 96.5 96.4 99.0 98.3  PLT 189 146* 109* 111* 90* 82* 84*     Basic Metabolic Panel: Recent Labs  Lab 05/01/22 1050 05/01/22 1647 05/02/22 0502 05/02/22 1519 05/03/22 0334 05/04/22 0449 05/05/22 0446  NA 143  --  144  --  148* 148* 146*   K 3.5  --  3.6  --  3.6 3.8 3.9  CL 107  --  108  --  110 111 109  CO2 26  --  27  --  32 30 28  GLUCOSE 151*  --  143*  --  128* 137* 212*  BUN 52*  --  47*  --  39* 43* 50*  CREATININE 1.90*  --  1.30*  --  1.29* 1.28* 1.10  CALCIUM 7.2*  --  7.4*  --  7.6* 7.3* 7.9*  MG 2.4 2.4 2.6* 2.5*  --  2.5* 2.5*  PHOS 3.4 3.1 3.0 3.0  --  3.6 3.7    GFR: Estimated Creatinine Clearance: 68.2 mL/min (by C-G formula based on SCr of 1.1 mg/dL). Recent Labs  Lab 04/30/22 0546 04/30/22 0603 04/30/22 1149 04/30/22 1730  04/30/22 1734 04/30/22 1955 05/01/22 0431 05/02/22 0502 05/03/22 0334 05/04/22 0449 05/05/22 0446  PROCALCITON 0.24  --   --   --   --   --  1.49 0.90  --   --   --   WBC  --   --   --   --    < >  --  14.6* 13.5* 13.3* 9.2 9.4  LATICACIDVEN  --    < > 2.2* 4.2*  --  2.8* 2.7*  --   --   --   --    < > = values in this interval not displayed.     Liver Function Tests: Recent Labs  Lab 04/30/22 0550 04/30/22 1734 05/02/22 0502 05/03/22 0334  AST 702* 1,750* 434* 336*  ALT 657* 1,349* 972* 1,002*  ALKPHOS 40 38 39 34*  BILITOT 1.4* 1.7* 1.3* 1.3*  PROT 6.0* 5.9* 5.1* 5.7*  ALBUMIN 3.3* 3.1* 2.6* 2.9*    No results for input(s): LIPASE, AMYLASE in the last 168 hours. No results for input(s): AMMONIA in the last 168 hours.  ABG    Component Value Date/Time   PHART 7.42 05/03/2022 1606   PCO2ART 47 05/03/2022 1606   PO2ART 69 (L) 05/03/2022 1606   HCO3 30.5 (H) 05/03/2022 1606   ACIDBASEDEF 1.9 04/30/2022 0804   O2SAT 95 05/03/2022 1606     Coagulation Profile: Recent Labs  Lab 04/30/22 0332 04/30/22 2109  INR 2.1* 3.1*     Cardiac Enzymes: No results for input(s): CKTOTAL, CKMB, CKMBINDEX, TROPONINI in the last 168 hours.  HbA1C: Hemoglobin A1C  Date/Time Value Ref Range Status  12/21/2015 12:00 AM 6.3  Final   Hgb A1c MFr Bld  Date/Time Value Ref Range Status  04/30/2022 05:46 AM 6.9 (H) 4.8 - 5.6 % Final    Comment:    (NOTE) Pre  diabetes:          5.7%-6.4%  Diabetes:              >6.4%  Glycemic control for   <7.0% adults with diabetes     CBG: Recent Labs  Lab 05/04/22 1910 05/04/22 2336 05/05/22 0407 05/05/22 0729 05/05/22 1111  GLUCAP 173* 174* 198* 234* 173*      DVT/GI PRX  assessed I Assessed the need for Labs I Assessed the need for Foley I Assessed the need for Central Venous Line Family Discussion when available I Assessed the need for Mobilization I made an Assessment of medications to be adjusted accordingly Safety Risk assessment completed  CASE DISCUSSED IN MULTIDISCIPLINARY ROUNDS WITH ICU TEAM  Long conversation with family at bedside  Critical Care Time 14mn

## 2022-05-05 NOTE — Consult Note (Signed)
PHARMACY CONSULT NOTE  Pharmacy Consult for Electrolyte Monitoring and Replacement   Recent Labs: Potassium (mmol/L)  Date Value  05/05/2022 3.9   Magnesium (mg/dL)  Date Value  05/05/2022 2.5 (H)   Calcium (mg/dL)  Date Value  05/05/2022 7.9 (L)   Albumin (g/dL)  Date Value  05/03/2022 2.9 (L)  04/03/2022 4.1   Phosphorus (mg/dL)  Date Value  05/05/2022 3.7   Sodium (mmol/L)  Date Value  05/05/2022 146 (H)  04/03/2022 149 (H)   Assessment: Patient is a 76 y/o M with medical history including Afib on Eliquis, COPD, combined systolic and diastolic CHF, P3IR, HLD, HTN, myasthenia gravis, ventricular tachycardia who presented to the ED 5/21 early AM with chest pain and respiratory distress. Patient experienced cardiac arrest with attainment of ROSC after 8 minutes of CPR. Patient is currently intubated, sedated, and on mechanical ventilation in the ICU. Pharmacy consulted to assist with electrolyte monitoring and replacement as indicated.  Extubated 5/24, re-intubated 5/24  Nutrition: Tube feeds  Diuresis: IV Lasix 40 mg BID  Scr stable; 1.1  Goal of Therapy:  Electrolytes within normal limits  Plan:  --Na 146, continue to monitor while on diuresis --Follow-up electrolytes with AM labs tomorrow --Avoid IV magnesium if possible given history of myasthenia gravis  Joseph Macdonald 05/05/2022 7:56 AM

## 2022-05-05 NOTE — Progress Notes (Addendum)
Daily Progress Note   Patient Name: Joseph Macdonald       Date: 05/05/2022 DOB: February 13, 1946  Age: 76 y.o. MRN#: [id number] Attending Physician: Nelle Don, MD Primary Care Physician: [name], MD Admit Date: 04/30/2022 Length of Stay: 5 days  Reason for Consultation/Follow-up: Establishing goals of care  HPI/Patient Profile:  76 y.o. male  with past medical history of A-fib on Eliquis, COPD, combined systolic and diastolic CHF, Q3RA, HLD, HTN, myasthenia gravis, V. tach.  He presented to the [facility] emergency department in severe respiratory distress and "purple" and before placed on telemetry lost pulses.  He received CPR for 8 minutes with no defibrillations and was emergently intubated for mechanical ventilatory support.  He was admitted on 04/30/2022 with PEA arrest (suspected pulmonary etiology), circulatory shock, acute on chronic combined systolic and diastolic CHF exacerbation, acute hypoxic respiratory failure, AKI in the setting of cardiac arrest, and others.    He was briefly extubated after MRI without evidence of anoxic brain injury but required emergent reintubation shortly thereafter due to left lung white-out from mucus plug s/p bronch removal. Overall prognosis poor.   PMT was consulted for [place] conversations.  Subjective:   Subjective: Chart Reviewed. Updates received. Patient Assessed. Created space and opportunity for patient  and family to explore thoughts and feelings regarding current medical situation.  Today's Discussion: Today I met with the patient's daughter at the bedside.  When I entered cardiology was there and speaking with the daughter.  The patient's wife is at home trying to get some rest before coming back.  Anticipate several family members coming to visit the patient today.  Per review of chart the patient has been stable on the vent overnight.  His FiO2 is down to 30%.  Daughter again emphasizes that the patient would not want this.  I  reminded her of our goal to allow family to come visit, plan to extubate with no intent to reintubate/DNR.  She verbalized understanding.  Timing would need to be up to pulmonary critical care.  We discussed that he may either continue to improve off the ventilator or he could rapidly decompensate at which point he would be made comfort care.  The patient's daughter was also asking about resources for financial planning, estate planning, and other financial decisions after the patient dies.  I explained that this is not a service to hospital offers, most likely because of conflict of interest.  She seems surprised by this.  I told her I would reach out to social work to see if they have any resources available independently that they could point her toward such as planning websites.  I provided emotional and general response to therapeutic listening, empathy, sharing of stories, therapeutic touch, and other techniques.  I answered all questions and addressed all concerns to the best of my ability.  Review of Systems  Unable to perform ROS: Intubated   Objective:   Vital Signs:  BP (!) 187/80   Pulse 67   Temp 99.5 F (37.5 C)   Resp 20   Wt 108.7 kg   SpO2 92%   BMI 37.53 kg/m   Physical Exam: Physical Exam Vitals and nursing note reviewed.  Constitutional:      General: He is not in acute distress.    Appearance: He is ill-appearing.     Interventions: He is sedated and intubated.  HENT:     Head: Normocephalic and atraumatic.  Cardiovascular:     Rate and Rhythm:  Normal rate.  Pulmonary:     Effort: Pulmonary effort is normal. No respiratory distress. He is intubated.  Abdominal:     General: Abdomen is protuberant.     Palpations: Abdomen is soft.  Skin:    General: Skin is warm and dry.  Psychiatric:     Comments: Appears anxious    Palliative Assessment/Data: 10% (intubated and on tube feeds)   Assessment & Plan:   Impression: Present on Admission:  Cardiac  arrest [facility])  76 year old male with multiple chronic comorbidities presents with acute respiratory distress, chest pain, and subsequent cardiac arrest.  ROSC returned after 8 minutes of CPR.  Remains intubated and sedated on mechanical ventilator.  Does not seem to have much interaction even on wake up assessment.  His echocardiogram also shows significant worsening over the past month, query cardiac etiology such as cardiac ischemia.  His family is aware that he is acutely ill and very critical.  They have elected to make him a DNR.  They share that overall he has not had a great quality of life as of late and he would not want a worsening quality of life.  MRI yesterday with no evidence of anoxic brain injury, patient became more responsive off sedation and was extubated. Required emergent re-intubation after 3 hours and found with left lung whiteout, pending bronchoscopy.  Overall prognosis is poor.  SUMMARY OF RECOMMENDATIONS   Remain DNR Continue to treat the treatable Next 24-48 hours are critical Anticipate extubation in the near future with no intent to reintubate Timing of extubation pending Allow/encourage family to visit Continued spiritual care Continued emotional support of the patient and family PMT will continue to follow  Symptom Management:  Per primary team PMT is available to assist as needed, especially if the patient transitions to comfort care at some point  Code Status: Limited code  Prognosis: Unable to determine  Discharge Planning: To Be Determined  Discussed with: Medical team, nursing team, patient's family  Thank you for allowing [place] to participate in the care of Joseph Macdonald PMT will continue to support holistically.  Billing based on MDM: High  Problems Addressed: One acute or chronic illness or injury that poses a threat to life or bodily function  Amount and/or Complexity of Data: Category 3:Discussion of management or test interpretation with  external physician/other qualified health care professional/appropriate source (not separately reported)  Risks: Discussions on one-way extubation plans, partial code status and planned DNR after extubation   [name], NP Palliative Medicine Team  Team Phone # [phone] (Nights/Weekends)  08/09/2021, 8:17 AM

## 2022-05-05 NOTE — Progress Notes (Addendum)
[place] for IV Heparin Indication: atrial fibrillation  Patient Measurements: Weight:  (UTA bed error) Heparin Dosing Weight: 90 kg  Labs: Recent Labs    05/03/22 0334 05/03/22 0830 05/04/22 0449 05/05/22 0446  HGB 14.8  --  14.1 13.7  HCT 47.9  --  47.2 45.5  PLT 90*  --  82* 84*  APTT  --  87* 91* 103*  HEPARINUNFRC  --  1.02* 0.84* 0.84*  CREATININE 1.29*  --  1.28* 1.10    Estimated Creatinine Clearance: 68.2 mL/min (by C-G formula based on SCr of 1.1 mg/dL).   Medical History: Past Medical History:  Diagnosis Date   Arthritis    COPD (chronic obstructive pulmonary disease) ([place])    Diabetes mellitus without complication (HCC)    diet controlled   Dyspnea    with activity   Hyperlipidemia    Hypertension    Myasthenia gravis ([place])    Oxygen deficit    Ventricular tachycardia [facility])    Assessment: 76 yo M w/ PMH of atrial fibrillation, COPD, CHF, DM, HLD, HTN presenting to [facility] ED via EMS from home on 04/30/22 in respiratory distress. Prior to admission he was receiving apixaban with last dose unknown. Baseline INR 2.1, H&H/PLT wnl, LFTs elevated  Goal of Therapy:  Heparin level 0.3-0.7 units/ml aPTT 66 - 102 seconds Monitor platelets by anticoagulation protocol: Yes   Results Date/time HL/aPTT Comments 5/21 2109 aPTT 110 Suprather; 1300 un/hr 5/22 0431 aPTT 98 Thera x 1; 1200 un/hr 5/22 1210 aPTT 83 Thera x 2, 1200 un/hr 5/23 0502       aPTT 60     Subthera; 1200 un/hr  5/23 1519 aPTT 48 Subthera, 1400 un/hr 5/23 2341       aPTT 75 Thera x 1; 1650 un/hr 5/24 0830 aPTT 87 Thera x 2; 1650 un/hr 5/25 0449       aPTT 91          Thera X 3; 1650 units/hr 5/26 0446       aPTT 103        Supratherapeutic   Plan:  5/26 @ 0446:  aPTT = 103, HL = 0.84 aPTT slightly therapeutic , HL still elevated from Eliquis PTA  Will decrease heparin drip to 1550 units/hr.   Will recheck aPTT 8 hrs after rate change.  Will recheck HL  on 5/27 with AM labs.   Joseph Macdonald D 05/05/2022 5:50 AM

## 2022-05-05 NOTE — Progress Notes (Signed)
Progress Note  Patient Name: Joseph Macdonald Date of Encounter: 05/05/2022  Primary Cardiologist: Mariah Milling  Subjective   Remains intubated and sedated. UOP 3.1 L for the past 24 hours, net - 4.1 L for the admission.   Inpatient Medications    Scheduled Meds:  budesonide (PULMICORT) nebulizer solution  0.25 mg Nebulization BID   chlorhexidine gluconate (MEDLINE KIT)  15 mL Mouth Rinse BID   Chlorhexidine Gluconate Cloth  6 each Topical Daily   docusate  100 mg Per Tube BID   feeding supplement (PROSource TF)  45 mL Per Tube TID   feeding supplement (VITAL HIGH PROTEIN)  1,000 mL Per Tube Q24H   free water  30 mL Per Tube Q4H   furosemide  40 mg Intravenous BID   insulin aspart  0-20 Units Subcutaneous Q4H   ipratropium-albuterol  3 mL Nebulization Q6H   mouth rinse  15 mL Mouth Rinse 10 times per day   methylPREDNISolone (SOLU-MEDROL) injection  20 mg Intravenous BID   pantoprazole (PROTONIX) IV  40 mg Intravenous Daily   polyethylene glycol  17 g Per Tube Daily   pyridostigmine  30 mg Per Tube TID   Continuous Infusions:  fentaNYL infusion INTRAVENOUS 200 mcg/hr (05/05/22 0739)   heparin 1,550 Units/hr (05/05/22 0739)   piperacillin-tazobactam (ZOSYN)  IV 12.5 mL/hr at 05/05/22 0739   PRN Meds: atropine, docusate, fentaNYL (SUBLIMAZE) injection, fentaNYL (SUBLIMAZE) injection, hydrALAZINE, midazolam, polyethylene glycol   Vital Signs    Vitals:   05/05/22 0500 05/05/22 0600 05/05/22 0700 05/05/22 0729  BP:      Pulse: (!) 59 60 60   Resp: 20 20 20    Temp: 99 F (37.2 C) 99 F (37.2 C) 99 F (37.2 C)   TempSrc:      SpO2: 95% 95% 96% 95%  Weight:        Intake/Output Summary (Last 24 hours) at 05/05/2022 0805 Last data filed at 05/05/2022 0739 Gross per 24 hour  Intake 1348.44 ml  Output 4625 ml  Net -3276.56 ml   Filed Weights   05/01/22 0433 05/02/22 0457  Weight: 108.5 kg 108.7 kg    Telemetry    SR - Personally Reviewed  ECG    No new  tracings - Personally Reviewed  Physical Exam   GEN: No acute distress, ill appearing.   Neck: JVD difficult to assess secondary to respiratory support apparatus. Cardiac: RRR, no murmurs, rubs, or gallops.  Respiratory: Vented breath sounds bilaterally.  GI: Soft, nontender, non-distended.   MS: 1+ bilateral lower extremity pitting edema; No deformity. Neuro:  Intubated and sedated.  Psych: Intubated and sedated.  Labs    Chemistry Recent Labs  Lab 04/30/22 1734 05/01/22 1050 05/02/22 0502 05/03/22 0334 05/04/22 0449 05/05/22 0446  NA 141   < > 144 148* 148* 146*  K 4.5   < > 3.6 3.6 3.8 3.9  CL 104   < > 108 110 111 109  CO2 26   < > 27 32 30 28  GLUCOSE 170*   < > 143* 128* 137* 212*  BUN 44*   < > 47* 39* 43* 50*  CREATININE 3.18*   < > 1.30* 1.29* 1.28* 1.10  CALCIUM 7.4*   < > 7.4* 7.6* 7.3* 7.9*  PROT 5.9*  --  5.1* 5.7*  --   --   ALBUMIN 3.1*  --  2.6* 2.9*  --   --   AST 1,750*  --  434* 336*  --   --  ALT 1,349*  --  972* 1,002*  --   --   ALKPHOS 38  --  39 34*  --   --   BILITOT 1.7*  --  1.3* 1.3*  --   --   GFRNONAA 20*   < > 57* 58* 58* >60  ANIONGAP 11   < > $R'9 6 7 9   'WK$ < > = values in this interval not displayed.     Hematology Recent Labs  Lab 05/03/22 0334 05/04/22 0449 05/05/22 0446  WBC 13.3* 9.2 9.4  RBC 4.97 4.77 4.63  HGB 14.8 14.1 13.7  HCT 47.9 47.2 45.5  MCV 96.4 99.0 98.3  MCH 29.8 29.6 29.6  MCHC 30.9 29.9* 30.1  RDW 13.8 14.1 14.1  PLT 90* 82* 84*    Cardiac EnzymesNo results for input(s): TROPONINI in the last 168 hours. No results for input(s): TROPIPOC in the last 168 hours.   BNP Recent Labs  Lab 04/30/22 0332  BNP 971.6*     DDimer No results for input(s): DDIMER in the last 168 hours.   Radiology    DG Chest Port 1 View  Result Date: 05/04/2022 IMPRESSION: Support apparatus, as described. Persistent opacity within the left mid to lower lung field, which may reflect a pleural effusion, atelectasis and/or  consolidation. Cardiomegaly. Aortic Atherosclerosis (ICD10-I70.0). Electronically Signed   By: Kellie Simmering D.O.   On: 05/04/2022 10:30   DG Chest Port 1 View  Result Date: 05/03/2022 IMPRESSION: Appliances appear in satisfactory position. Improved aeration of the left lung with residual basilar atelectasis and small effusion. Electronically Signed   By: Lucienne Capers M.D.   On: 05/03/2022 18:01   DG Chest Port 1 View  Result Date: 05/03/2022 IMPRESSION: 1. Interval complete opacification of the left hemithorax, suspected to represent mucous plugging. 2. Satisfactory position of the ET tube and OG tube. Electronically Signed   By: Kerby Moors M.D.   On: 05/03/2022 13:36    Cardiac Studies   2D echo 04/30/2022: 1. Left ventricular ejection fraction, by estimation, is 35 to 40%. The  left ventricle has moderately decreased function. The left ventricle  demonstrates global hypokinesis. There is mild left ventricular  hypertrophy. Left ventricular diastolic  parameters are consistent with Grade I diastolic dysfunction (impaired  relaxation).   2. Right ventricular systolic function is moderately reduced. The right  ventricular size is mildly enlarged.   3. The mitral valve is normal in structure. No evidence of mitral valve  regurgitation.   4. The aortic valve is grossly normal. Aortic valve regurgitation is not  visualized. __________  2D echo 04/20/2022: 1. Challenging study, PVCs noted.   2. Left ventricular ejection fraction, by estimation, is 45 to 50%. The  left ventricle has mildly decreased function. The left ventricle has no  regional wall motion abnormalities. There is mild left ventricular  hypertrophy. Left ventricular diastolic  parameters are consistent with Grade I diastolic dysfunction (impaired  relaxation).   3. Right ventricular systolic function is mildly reduced. The right  ventricular size is normal. Tricuspid regurgitation signal is inadequate  for  assessing PA pressure.   4. The mitral valve is normal in structure. Mild to moderate mitral valve  regurgitation. No evidence of mitral stenosis.   5. The aortic valve is normal in structure. Aortic valve regurgitation is  mild. No aortic stenosis is present.   6. There is borderline dilatation of the aortic root, measuring 37 mm.  There is mild dilatation of the  ascending aorta, measuring 42 mm.   7. The inferior vena cava is normal in size with greater than 50%  respiratory variability, suggesting right atrial pressure of 3 mmHg.  Patient Profile     76 y.o. male with history of HFpEF, question of asymmetric septal hypertrophy, VT previously on amiodarone through the [place], PAF, myasthenia gravis, chronic hypoxic respiratory failure on supplemental oxygen, COPD, HTN, HLD, tobacco use, and obesity who was admitted with PEA arrest in the setting of acute on chronic respiratory failure with hypoxia and hypercapnia.   Assessment & Plan    1. PEA arrest with acute on chronic hypoxic and hypercapnic respiratory failure: -Felt to be in the setting of acute on chronic hypoxic respiratory failure with hypoxia with acute on chronic combined CHF -Estimated 8 minutes of downtime with CPR initiated immediately, not felt to be a candidate for [employer] protocol  -Failed extubation on 5/24, reintubated -Vent management per CCM -BUN starting to trend up, SCr improving, remains on IV Lasix with good UOP   2. Acute on chronic combined systolic and diastolic CHF: -Echo earlier this month as an outpatient demonstrated systolic dysfunction with an EF of 45-50%, echo this admission with an EF of 35-40% -IV Lasix -Throughout the admission he has not been maintained on ACEi/ARB/ARNI/MRA/SGLT2 inhibitor or beta blocker due to AKI and acute decompensated CHF, escalate as able -When able, recommend R/LHC  3. PAF: -Maintaining sinus rhythm -Heparin gtt with [place] prior to discharge -PTA flecainide stopped with LV  dysfunction -Consider starting beta blocker, such as bisoprolol, will discuss with MD -High risk for recurrent arrhythmia      For questions or updates, please contact [facility] Please consult [email or web address] for contact info under Cardiology/STEMI.    Signed, Christell Faith, PA-C [place] Pager: [phone] 05/05/2022, 8:05 AM

## 2022-05-06 DIAGNOSIS — I469 Cardiac arrest, cause unspecified: Secondary | ICD-10-CM | POA: Diagnosis not present

## 2022-05-06 DIAGNOSIS — I5020 Unspecified systolic (congestive) heart failure: Secondary | ICD-10-CM | POA: Diagnosis not present

## 2022-05-06 DIAGNOSIS — I161 Hypertensive emergency: Secondary | ICD-10-CM | POA: Diagnosis present

## 2022-05-06 DIAGNOSIS — J989 Respiratory disorder, unspecified: Secondary | ICD-10-CM | POA: Diagnosis not present

## 2022-05-06 DIAGNOSIS — J810 Acute pulmonary edema: Secondary | ICD-10-CM | POA: Diagnosis not present

## 2022-05-06 DIAGNOSIS — I255 Ischemic cardiomyopathy: Secondary | ICD-10-CM | POA: Diagnosis not present

## 2022-05-06 LAB — CBC
HCT: 47 % (ref 39.0–52.0)
Hemoglobin: 14.2 g/dL (ref 13.0–17.0)
MCH: 29.3 pg (ref 26.0–34.0)
MCHC: 30.2 g/dL (ref 30.0–36.0)
MCV: 97.1 fL (ref 80.0–100.0)
Platelets: 105 10*3/uL — ABNORMAL LOW (ref 150–400)
RBC: 4.84 MIL/uL (ref 4.22–5.81)
RDW: 14 % (ref 11.5–15.5)
WBC: 14 10*3/uL — ABNORMAL HIGH (ref 4.0–10.5)
nRBC: 0 % (ref 0.0–0.2)

## 2022-05-06 LAB — GLUCOSE, CAPILLARY
Glucose-Capillary: 150 mg/dL — ABNORMAL HIGH (ref 70–99)
Glucose-Capillary: 149 mg/dL — ABNORMAL HIGH (ref 70–99)
Glucose-Capillary: 103 mg/dL — ABNORMAL HIGH (ref 70–99)
Glucose-Capillary: 102 mg/dL — ABNORMAL HIGH (ref 70–99)
Glucose-Capillary: 98 mg/dL (ref 70–99)

## 2022-05-06 LAB — CULTURE, BAL-QUANTITATIVE W GRAM STAIN: Culture: 20000 — AB

## 2022-05-06 LAB — MAGNESIUM: Magnesium: 2.6 mg/dL — ABNORMAL HIGH (ref 1.7–2.4)

## 2022-05-06 LAB — BASIC METABOLIC PANEL
Anion gap: 8 (ref 5–15)
BUN: 55 mg/dL — ABNORMAL HIGH (ref 8–23)
CO2: 30 mmol/L (ref 22–32)
Calcium: 8.1 mg/dL — ABNORMAL LOW (ref 8.9–10.3)
Chloride: 109 mmol/L (ref 98–111)
Creatinine, Ser: 1.13 mg/dL (ref 0.61–1.24)
GFR, Estimated: >60 mL/min (ref >60)
Glucose, Bld: 159 mg/dL — ABNORMAL HIGH (ref 70–99)
Potassium: 4 mmol/L (ref 3.5–5.1)
Sodium: 147 mmol/L — ABNORMAL HIGH (ref 135–145)

## 2022-05-06 LAB — PHOSPHORUS: Phosphorus: 4.8 mg/dL — ABNORMAL HIGH (ref 2.5–4.6)

## 2022-05-06 MED ORDER — IPRATROPIUM-ALBUTEROL 0.5-2.5 (3) MG/3ML IN SOLN
3.0000 mL | Freq: Two times a day (BID) | RESPIRATORY_TRACT | Status: DC
  Administered 2022-05-06 – 2022-05-18 (×23): 3 mL via RESPIRATORY_TRACT
  Filled 2022-05-06 (×23): qty 3

## 2022-05-06 MED ORDER — APIXABAN 5 MG PO TABS
5.0000 mg | ORAL_TABLET | Freq: Two times a day (BID) | ORAL | Status: DC
  Administered 2022-05-06 – 2022-05-08 (×5): 5 mg via ORAL
  Filled 2022-05-06 (×5): qty 1

## 2022-05-06 MED ORDER — PYRIDOSTIGMINE BROMIDE 60 MG PO TABS
30.0000 mg | ORAL_TABLET | Freq: Three times a day (TID) | ORAL | Status: DC
  Administered 2022-05-07 – 2022-05-18 (×33): 30 mg via ORAL
  Filled 2022-05-06 (×6): qty 1
  Filled 2022-05-06: qty 0.5
  Filled 2022-05-06 (×2): qty 1
  Filled 2022-05-06: qty 0.5
  Filled 2022-05-06: qty 1
  Filled 2022-05-06: qty 0.5
  Filled 2022-05-06 (×2): qty 1
  Filled 2022-05-06: qty 0.5
  Filled 2022-05-06: qty 1
  Filled 2022-05-06 (×2): qty 0.5
  Filled 2022-05-06 (×4): qty 1
  Filled 2022-05-06: qty 0.5
  Filled 2022-05-06 (×2): qty 1
  Filled 2022-05-06: qty 0.5
  Filled 2022-05-06 (×10): qty 1

## 2022-05-06 MED ORDER — NITROGLYCERIN IN D5W 200-5 MCG/ML-% IV SOLN
0.0000 ug/min | INTRAVENOUS | Status: DC
  Administered 2022-05-06: 5 ug/min via INTRAVENOUS
  Administered 2022-05-06: 100 ug/min via INTRAVENOUS
  Filled 2022-05-06 (×2): qty 250

## 2022-05-06 MED ORDER — ORAL CARE MOUTH RINSE
15.0000 mL | Freq: Two times a day (BID) | OROMUCOSAL | Status: DC
  Administered 2022-05-06 – 2022-05-17 (×17): 15 mL via OROMUCOSAL

## 2022-05-06 MED ORDER — SODIUM CHLORIDE 0.9% FLUSH
10.0000 mL | INTRAVENOUS | Status: DC | PRN
  Administered 2022-05-09 – 2022-05-15 (×3): 10 mL

## 2022-05-06 MED ORDER — CHLORHEXIDINE GLUCONATE 0.12 % MT SOLN
15.0000 mL | Freq: Two times a day (BID) | OROMUCOSAL | Status: DC
  Administered 2022-05-06 – 2022-05-18 (×23): 15 mL via OROMUCOSAL
  Filled 2022-05-06 (×19): qty 15

## 2022-05-06 MED ORDER — LIDOCAINE HCL 1 % IJ SOLN
INTRAMUSCULAR | Status: AC
  Administered 2022-05-06: 4 mL
  Filled 2022-05-06: qty 10

## 2022-05-06 MED ORDER — POLYETHYLENE GLYCOL 3350 17 G PO PACK
17.0000 g | PACK | Freq: Every day | ORAL | Status: DC | PRN
  Administered 2022-05-11 – 2022-05-12 (×2): 17 g via ORAL
  Filled 2022-05-06 (×2): qty 1

## 2022-05-06 MED ORDER — SODIUM CHLORIDE 0.9% FLUSH
10.0000 mL | Freq: Two times a day (BID) | INTRAVENOUS | Status: DC
  Administered 2022-05-07 – 2022-05-08 (×4): 10 mL

## 2022-05-06 MED ORDER — HYDRALAZINE HCL 20 MG/ML IJ SOLN
20.0000 mg | INTRAMUSCULAR | Status: DC | PRN
  Administered 2022-05-06 – 2022-05-09 (×3): 20 mg via INTRAVENOUS
  Filled 2022-05-06 (×4): qty 1

## 2022-05-06 NOTE — Progress Notes (Signed)
Progress Note  Patient Name: Joseph Macdonald Date of Encounter: 05/06/2022  [facility] Cardiologist: [name], MD   Subjective   Reports abdominal pain.  +SOB.  No chest pain.   Inpatient Medications    Scheduled Meds:  apixaban  5 mg Per Tube BID   budesonide (PULMICORT) nebulizer solution  0.25 mg Nebulization BID   chlorhexidine  15 mL Mouth Rinse BID   chlorhexidine gluconate (MEDLINE KIT)  15 mL Mouth Rinse BID   Chlorhexidine Gluconate Cloth  6 each Topical Daily   diazepam  5 mg Intravenous Q8H   docusate  100 mg Per Tube BID   feeding supplement (PROSource TF)  45 mL Per Tube TID   feeding supplement (VITAL HIGH PROTEIN)  1,000 mL Per Tube Q24H   free water  30 mL Per Tube Q4H   insulin aspart  0-20 Units Subcutaneous Q4H   ipratropium-albuterol  3 mL Nebulization BID   mouth rinse  15 mL Mouth Rinse q12n4p   methylPREDNISolone (SOLU-MEDROL) injection  20 mg Intravenous BID   OLANZapine zydis  5 mg Oral BID   pantoprazole (PROTONIX) IV  40 mg Intravenous Daily   polyethylene glycol  17 g Per Tube Daily   pyridostigmine  30 mg Per Tube TID   Continuous Infusions:  nitroGLYCERIN     piperacillin-tazobactam (ZOSYN)  IV 3.375 g (05/06/22 0549)   PRN Meds: atropine, diazepam, docusate, hydrALAZINE, HYDROmorphone (DILAUDID) injection, polyethylene glycol   Vital Signs    Vitals:   05/06/22 0921 05/06/22 0955 05/06/22 1000 05/06/22 1026  BP:      Pulse: 81  86 94  Resp: 16  (!) 32 16  Temp:  98.3 F (36.8 C)    TempSrc:  Oral    SpO2: 97%  92% 94%  Weight:        Intake/Output Summary (Last 24 hours) at 05/06/2022 1212 Last data filed at 05/06/2022 0549 Gross per 24 hour  Intake 2484.8 ml  Output 1900 ml  Net 584.8 ml      05/06/2022    5:00 AM  Last 3 Weights  Weight (lbs) 239 lb 10.2 oz  Weight (kg) 108.7 kg      Telemetry    Sinus rhythm.  PVCs - Personally Reviewed  ECG    05/05/2022: Sinus rhythm.  Rate 66 bpm.  LAFB.  LVH.   Nonspecific T wave abnormalities. - Personally Reviewed  Physical Exam   VS:  BP (!) 187/80   Pulse 94   Temp 98.3 F (36.8 C) (Oral)   Resp 16   Wt 108.7 kg   SpO2 94%   BMI 37.53 kg/m  , BMI Body mass index is 37.53 kg/m. GENERAL:  Ill-appearing.  Moderate respiratory distress.  Wet cough but unable to expectorate. HEENT: Pupils equal round and reactive, fundi not visualized, oral mucosa unremarkable NECK:  No jugular venous distention, waveform within normal limits, carotid upstroke brisk and symmetric, no bruits, no thyromegaly LUNGS: Bilateral rhonchi anteriorly. HEART:  RRR.  PMI not displaced or sustained,S1 and S2 within normal limits, no S3, no S4, no clicks, no rubs, no murmurs ABD:  Flat, positive bowel sounds normal in frequency in pitch, no bruits, no rebound, no guarding, no midline pulsatile mass, no hepatomegaly, no splenomegaly EXT:  2 plus pulses throughout, 1+ bilateral lower extremity edema, no cyanosis no clubbing SKIN:  No rashes no nodules NEURO:  Cranial nerves II through XII grossly intact, motor grossly intact throughout PSYCH:  Cognitively  intact, oriented to person place and time   Labs    High Sensitivity Troponin:   Recent Labs  Lab 04/30/22 0332 04/30/22 0546 04/30/22 1734 04/30/22 1955  TROPONINIHS 373* 1,137* 2,844* 2,210*     Chemistry Recent Labs  Lab 04/30/22 1734 04/30/22 1955 05/02/22 0502 05/02/22 1519 05/03/22 0334 05/04/22 0449 05/05/22 0446 05/06/22 0544  NA 141   < > 144  --  148* 148* 146* 147*  K 4.5   < > 3.6  --  3.6 3.8 3.9 4.0  CL 104   < > 108  --  110 111 109 109  CO2 26   < > 27  --  32 _0 GLUCOSE 170*   < > 143*  --  128* 137* 212* 159*  BUN 44*   < > 47*  --  39* 43* 50* 55*  CREATININE 3.18*   < > 1.30*  --  1.29* 1.28* 1.10 1.13  CALCIUM 7.4*   < > 7.4*  --  7.6* 7.3* 7.9* 8.1*  MG  --    < > 2.6*   < >  --  2.5* 2.5* 2.6*  PROT 5.9*  --  5.1*  --  5.7*  --   --   --   ALBUMIN 3.1*  --  2.6*  --   2.9*  --   --   --   AST 1,750*  --  434*  --  336*  --   --   --   ALT 1,349*  --  972*  --  1,002*  --   --   --   ALKPHOS 38  --  39  --  34*  --   --   --   BILITOT 1.7*  --  1.3*  --  1.3*  --   --   --   GFRNONAA 20*   < > 57*  --  58* 58* >60 >60  ANIONGAP 11   < > 9  --  _1 < > = values in this interval not displayed.    Lipids No results for input(s): CHOL, TRIG, HDL, LABVLDL, LDLCALC, CHOLHDL in the last 168 hours.  Hematology Recent Labs  Lab 05/04/22 0449 05/05/22 0446 05/06/22 0544  WBC 9.2 9.4 14.0*  RBC 4.77 4.63 4.84  HGB 14.1 13.7 14.2  HCT 47.2 45.5 47.0  MCV 99.0 98.3 97.1  MCH 29.6 29.6 29.3  MCHC 29.9* 30.1 30.2  RDW 14.1 14.1 14.0  PLT 82* 84* 105*   Thyroid  Recent Labs  Lab 04/30/22 0546  TSH 0.441  FREET4 0.88    BNP Recent Labs  Lab 04/30/22 0332  BNP 971.6*    DDimer No results for input(s): DDIMER in the last 168 hours.   Radiology    No results found.  Cardiac Studies   Echo 04/30/2022:  1. Left ventricular ejection fraction, by estimation, is 35 to 40%. The  left ventricle has moderately decreased function. The left ventricle  demonstrates global hypokinesis. There is mild left ventricular  hypertrophy. Left ventricular diastolic  parameters are consistent with Grade I diastolic dysfunction (impaired  relaxation).   2. Right ventricular systolic function is moderately reduced. The right  ventricular size is mildly enlarged.   3. The mitral valve is normal in structure. No evidence of mitral valve  regurgitation.   4. The aortic valve is grossly normal. Aortic valve regurgitation is not  visualized.  Echo 04/20/22:  1. Challenging study, PVCs noted.   2. Left ventricular ejection fraction, by estimation, is 45 to 50%. The  left ventricle has mildly decreased function. The left ventricle has no  regional wall motion abnormalities. There is mild left ventricular  hypertrophy. Left ventricular diastolic  parameters are  consistent with Grade I diastolic dysfunction (impaired  relaxation).   3. Right ventricular systolic function is mildly reduced. The right  ventricular size is normal. Tricuspid regurgitation signal is inadequate  for assessing PA pressure.   4. The mitral valve is normal in structure. Mild to moderate mitral valve  regurgitation. No evidence of mitral stenosis.   5. The aortic valve is normal in structure. Aortic valve regurgitation is  mild. No aortic stenosis is present.   6. There is borderline dilatation of the aortic root, measuring 37 mm.  There is mild dilatation of the ascending aorta, measuring 42 mm.   7. The inferior vena cava is normal in size with greater than 50%  respiratory variability, suggesting right atrial pressure of 3 mmHg.   Patient Profile     76 y.o. male with chronic systolic and diastolic heart failure, PAF, COPD, myasthenia gravis admitted with PEA arrest in the setting of acute on chronic systolic diastolic heart failure and hypoxic respiratory failure in the setting of not taking his diuretics.  Hospitalization also complicated by acute on chronic renal failure.  Assessment & Plan    # Acute on chronic systolic and diastolic heart failure:  LVEF this admission 35 to 40%, down from 45 to 50% previously.  Holding further diuresis in the setting of hyponatremia.  Start Entresto and BiDil once able to tolerate oral medications and his blood pressure allows.  For now, IV nitroglycerin and hydralazine.  #PEA arrest: Occurred in the setting of decompensated heart failure and respiratory failure.  He has known atrial fibrillation and PVCs.  Flecainide currently being held.  Patient was intubated and extubated 5/24.  He had to be reintubated.  Family elected for one-way extubation today.  He is currently talking but quite dyspneic.  #Hypertensive emergency: Blood pressures are currently in the 902I systolic.  He currently is not able to tolerate oral medications.  We  will start nitroglycerin drip, as these blood pressures are going to worsen his respiratory status.  Continue with hydralazine IV as needed.  Once he is cleared for a swallowing evaluation recommend starting oral medication for blood pressure.  Creatinine is back to baseline so would start low-dose Entresto.  Avoid beta-blocker in the setting of myasthenia gravis.  # PAF:  Currently in sinus rhythm as above.  No beta-blockers in the setting of myasthenia gravis.  No calcium channel blockers in the setting of systolic dysfunction.  If necessary would use amiodarone or digoxin for rate control.  Continue Eliquis once able to tolerate orals.  Would start IV heparin if this is not anticipated to be soon.  Total critical care time: 50 minutes. Critical care time was exclusive of separately billable procedures and treating other patients. Critical care was necessary to treat or prevent imminent or life-threatening deterioration. Critical care was time spent personally by me on the following activities: development of treatment plan with patient and/or surrogate as well as nursing, discussions with consultants, evaluation of patient's response to treatment, examination of patient, obtaining history from patient or surrogate, ordering and performing treatments and interventions, ordering and review of laboratory studies, ordering and review of radiographic studies, pulse oximetry and re-evaluation of patient's  condition.       For questions or updates, please contact [place] Please consult [email or web address] for contact info under        Signed, Skeet Latch, MD  05/06/2022, 12:12 PM

## 2022-05-06 NOTE — Progress Notes (Signed)
Pt. Extubated to 6lnc,sat 94,rr20,hr 92.

## 2022-05-06 NOTE — Consult Note (Signed)
PHARMACY CONSULT NOTE  Pharmacy Consult for Electrolyte Monitoring and Replacement   Recent Labs: Potassium (mmol/L)  Date Value  05/06/2022 4.0   Magnesium (mg/dL)  Date Value  05/06/2022 2.6 (H)   Calcium (mg/dL)  Date Value  05/06/2022 8.1 (L)   Albumin (g/dL)  Date Value  05/03/2022 2.9 (L)  04/03/2022 4.1   Phosphorus (mg/dL)  Date Value  05/06/2022 4.8 (H)   Sodium (mmol/L)  Date Value  05/06/2022 147 (H)  04/03/2022 149 (H)   Assessment: Patient is a 76 y/o M with medical history including Afib on Eliquis, COPD, combined systolic and diastolic CHF, Y1PJ, HLD, HTN, myasthenia gravis, ventricular tachycardia who presented to the ED 5/21 early AM with chest pain and respiratory distress. Patient experienced cardiac arrest with attainment of ROSC after 8 minutes of CPR. Patient is currently intubated, sedated, and on mechanical ventilation in the ICU. Pharmacy consulted to assist with electrolyte monitoring and replacement as indicated.  Extubated 5/24, re-intubated 5/24  Nutrition: Tube feeds, free water 26m q4h  Scr stable; 1.13  Goal of Therapy:  Electrolytes within normal limits  Plan:  --Na 147, continue to monitor while on diuresis --hypermagnesemia, hyperphosphatemia. Per CCMD, no current pharmacological intervention currently needed. Will continue to monitor. --Follow-up electrolytes with AM labs tomorrow --Avoid IV magnesium if possible given history of myasthenia gravis  WPearla Dubonnet5/27/2023 7:54 AM

## 2022-05-06 NOTE — Progress Notes (Signed)
NAME:  Joseph Macdonald, MRN:  [id number], DOB:  May 17, 1946, LOS: 6 ADMISSION DATE:  04/30/2022, CONSULTATION DATE:  04/30/22 REFERRING MD:  Dr. Alfred Levins, CHIEF COMPLAINT:  Respiratory distress   History of Present Illness:  76 yo M presenting to [facility] ED via EMS from home on 04/30/22 in respiratory distress. Per documentation patient was found by EMS hypoxic with SpO2 71%-88% placed on NRB with albuterol administered. Family reports the patient was in his normal state of health but had NOT been taking his Lasix prescription. They believe he was taking the rest of his medication as prescribed but did not take his nighttime medications on Saturday. Family denies fever/chills, vomiting, slurred speech, weakness. They did report he had some nausea, one episode of diarrhea and mild nasal congestion as well as a non productive cough. They report he has chronic shortness of breath which started to appear more severe on Saturday.  They confirmed that the patient does not wear oxygen at home. Per report he quit smoking in 2018 after a 50 pack year history, drinks ETOH occasionally and has no recreational drug use history.   ED course: Upon arrival, per EDP report the patient was in severe respiratory distress and "purple". Before they were able to intervene, patient lost pulses. He was not on telemetry yet and it was unclear what was the initial rhythm. Patient received CPR for 8 minutes no defibrillations, and was emergently intubated requiring mechanical ventilatory support. Upon intubation, EDP noted frothy respiratory secretions. ROSC obtained and patient extremely hypertensive with SBP in the 200's. Lasix given and propofol started. SBP subsequently dropped into the 60's, propofol stopped and levophed started. EKG showing some inferolateral STE, Dr. Alfred Levins spoke with STEMI cardiologist Dr. Fletcher Anon who reviewed EKG and determined the patient did not meet STEMI criteria. Medications given: 80 mg Lasix IV,  propofol started then stopped, levophed drip started  Significant labs: (Labs/ Imaging personally reviewed) I, Domingo Pulse Rust-Chester, AGACNP-BC, personally viewed and interpreted this ECG. EKG Interpretation: Date: 04/30/22, EKG Time: 03:38, Rate: 63, Rhythm: NSR, QRS Axis:  LAD, Intervals: 1st degree HB, ST/T Wave abnormalities: Inferolateral STE, non specific T wave inversions , Narrative Interpretation: NSR with 1st dgree HB & LAD with inferolateral STE that does not meet STEMI criteria per cardiology review Chemistry: pending Hematology: mild leukocytosis WBC: 12.4, Hgb: 16.6,  Troponin: 373, BNP: 971.6, lactic/PCT: pending,  COVID-19 & Influenza A/B: pending ABG: pending CXR 04/30/22: Mild interstitial pulmonary edema  PCCM consulted for admission due to acute hypoxic respiratory failure and subsequent cardiac arrest with circulatory shock requiring mechanical ventilation and vasopressor support.  Pertinent  Medical History  A-fib on Eliquis COPD Combined systolic & diastolic CHF [id number] HLD HTN Myasthenia gravis Ventricular Tachycardia   Micro Data:  5/21: SARS-CoV-2 and influenza PCR>> negative 5/21: Respiratory viral panel>> negative 5/21: MRSA PCR>> negative 5/21: Tracheal aspirate>> normal respiratory flora  Antimicrobials:  Unasyn 5/21>> Azithromycin 5/21>> 5/22  Significant Hospital Events: Including procedures, antibiotic start and stop dates in addition to other pertinent events   04/30/22: Admit to ICU s/p acute hypoxic respiratory failure and subsequent cardiac arrest with emergent intubation requiring mechanical ventilatory support. 5/22 remains intubated, on vent.  Code Status changed to DNR.  Repeat CTH negative, EEG without evidence of seizures 5/23: Patient withdraws from pain in all extremities with intermittent agitation, plan for MRI Brain & CT Abdomen 5/24: WUA with plans for extubation 5/24 failed extubation, LEFT lung white out on CXR 5/24  re-intubated, severe hypoxia, s/p  bronch removed bloody mucus plugs 5/25 remains on vent 5/26 stable on vent overnight  Interim History / Subjective:  Severe hypoxia Failed extubation 5/24 Remains critically ill Prognosis is very poor  Vent Mode: PRVC FiO2 (%):  [28 %] 28 % Set Rate:  [20 bmp] 20 bmp Vt Set:  [500 mL] 500 mL PEEP:  [5 cmH20] 5 cmH20 Plateau Pressure:  [15 cmH20] 15 cmH20    Objective   Blood pressure (!) 187/80, pulse (!) 111, temperature 98.3 F (36.8 C), temperature source Axillary, resp. rate 19, weight 108.7 kg, SpO2 (!) 82 %.    Vent Mode: PRVC FiO2 (%):  [28 %] 28 % Set Rate:  [20 bmp] 20 bmp Vt Set:  [500 mL] 500 mL PEEP:  [5 cmH20] 5 cmH20 Plateau Pressure:  [15 cmH20] 15 cmH20   Intake/Output Summary (Last 24 hours) at 05/06/2022 1848 Last data filed at 05/06/2022 1522 Gross per 24 hour  Intake 706.06 ml  Output 2050 ml  Net -1343.94 ml    Filed Weights   05/02/22 0457 05/06/22 0500  Weight: 108.7 kg 108.7 kg    REVIEW OF SYSTEMS  PATIENT IS UNABLE TO PROVIDE COMPLETE REVIEW OF SYSTEMS DUE TO SEVERE CRITICAL ILLNESS AND TOXIC METABOLIC ENCEPHALOPATHY    PHYSICAL EXAMINATION:  GENERAL:critically ill appearing, +resp distress EYES: Pupils equal, round, reactive to light.  No scleral icterus.  MOUTH: Moist mucosal membrane. INTUBATED NECK: Supple.  PULMONARY: +rhonchi, +wheezing CARDIOVASCULAR: S1 and S2.  No murmurs  GASTROINTESTINAL: Soft, nontender, -distended. Positive bowel sounds.  MUSCULOSKELETAL: No swelling, clubbing, or edema.  NEUROLOGIC: obtunded SKIN:intact,warm,dry     Assessment & Plan:  76 yo white male with  PEA Cardiac arrest: suspect pulmonary etiology due to pneumonia with Elevated Troponin in setting of Demand Ischemia /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 Paroxsymal Atrial Fibrillation failed trial of extubation due to lung collapse and inability to protect airway with  severe resp insufficiency with underlying MG   Severe ACUTE Hypoxic and Hypercapnic Respiratory Failure Aspiration? HCAP -Plan is to extubate without re-intubation -Will premed with valilum -maintain rate and afterload reduction after extubation to prevent diastolic pulm edema -remains on Zosyn -lower dose steroid  ACUTE SYSTOLIC CARDIAC FAILURE- EF 20% 8 minutes downtime, CPR initiated immediately, received no defibrillations (deemed not a candidate for hypothermia protocol) -Allow to re-equilibrate off lasix again today, -6L+ since admit -follow up cardiac enzymes as indicated -now on DOAC -Cardiology following, appreciate input -May resume diuresis as BP and renal function permits -Echocardiogram 5/21: LVEF 35-40%, Grade I DD, RV systolic function moderately reduced   Myasthenia Gravis query crisis -Neurology following to assist with myasthenia gravis management and medication regimen   ACUTE KIDNEY INJURY/Renal Failure -AKI better  -Na higher -continue Foley Catheter-assess need -Avoid nephrotoxic agents -Follow urine output, BMP -Ensure adequate renal perfusion, optimize oxygenation -Renal dose medications   Intake/Output Summary (Last 24 hours) at 05/06/2022 1848 Last data filed at 05/06/2022 1522 Gross per 24 hour  Intake 706.06 ml  Output 2050 ml  Net -1343.94 ml   Net IO Since Admission: -6,238.91 mL [05/06/22 1848]      Latest Ref Rng & Units 05/06/2022    5:44 AM 05/05/2022    4:46 AM 05/04/2022    4:49 AM  BMP  Glucose 70 - 99 mg/dL 159   212   137    BUN 8 - 23 mg/dL 55   50   43    Creatinine 0.61 -  1.24 mg/dL 1.13   1.10   1.28    Sodium 135 - 145 mmol/L 147   146   148    Potassium 3.5 - 5.1 mmol/L 4.0   3.9   3.8    Chloride 98 - 111 mmol/L 109   109   111    CO2 22 - 32 mmol/L '30   28   30    '$ Calcium 8.9 - 10.3 mg/dL 8.1   7.9   7.3      NEUROLOGY -anxiolysis  -Maintain prn Dilaudid and prn Valium  -Maintain ODT Zyprexa  ENDO - ICU  hypoglycemic\Hyperglycemia protocol -check FSBS per protocol   GI GI PROPHYLAXIS as indicated  NUTRITIONAL STATUS DIET-->TF's held OGT out Constipation protocol as indicated   ELECTROLYTES -follow labs as needed -replace as needed -pharmacy consultation and following   Transaminitis in the setting of cardiac arrest   Lab Results  Component Value Date   AST 336 (H) 05/03/2022   AST 434 (H) 05/02/2022   AST 1,750 (H) 04/30/2022   Lab Results  Component Value Date   ALT 1,002 (H) 05/03/2022   ALT 972 (H) 05/02/2022   ALT 1,349 (H) 04/30/2022     CT Abd Pelvis 05/02/22 revealed no focal liver abnormality is seen. No gallstones, gallbladder wall thickening, or biliary dilatation, gallbladder wall thickening, or biliary dilatation. - Trend hepatic function - Avoid hepatotoxic agents   Best Practice (right click and "Reselect all SmartList Selections" daily)  Diet/type: NPO w/ meds via tube DVT prophylaxis: Heparin gtt GI prophylaxis: PPI Lines: Central line, Arterial Line, and yes and it is still needed Foley:  Yes, and it is still needed Code Status: Limited Code (Mechanical Intubation Only)   Patient's wife at bedside and updated regarding plan of care  multiple times Labs   CBC: Recent Labs  Lab 04/30/22 0332 04/30/22 1734 05/01/22 0431 05/02/22 0502 05/03/22 0334 05/04/22 0449 05/05/22 0446 05/06/22 0544  WBC 12.4* 18.9*   < > 13.5* 13.3* 9.2 9.4 14.0*  NEUTROABS 9.4* 15.8*  --   --   --   --   --   --   HGB 16.6 16.3   < > 14.5 14.8 14.1 13.7 14.2  HCT 57.7* 54.8*   < > 46.6 47.9 47.2 45.5 47.0  MCV 104.3* 100.0   < > 96.5 96.4 99.0 98.3 97.1  PLT 189 146*   < > 111* 90* 82* 84* 105*   < > = values in this interval not displayed.     Basic Metabolic Panel: Recent Labs  Lab 05/02/22 0502 05/02/22 1519 05/03/22 0334 05/04/22 0449 05/05/22 0446 05/06/22 0544  NA 144  --  148* 148* 146* 147*  K 3.6  --  3.6 3.8 3.9 4.0  CL 108  --  110 111  109 109  CO2 27  --  32 '30 28 30  '$ GLUCOSE 143*  --  128* 137* 212* 159*  BUN 47*  --  39* 43* 50* 55*  CREATININE 1.30*  --  1.29* 1.28* 1.10 1.13  CALCIUM 7.4*  --  7.6* 7.3* 7.9* 8.1*  MG 2.6* 2.5*  --  2.5* 2.5* 2.6*  PHOS 3.0 3.0  --  3.6 3.7 4.8*    GFR: Estimated Creatinine Clearance: 66.4 mL/min (by C-G formula based on SCr of 1.13 mg/dL). Recent Labs  Lab 04/30/22 4098 04/30/22 0603 04/30/22 1149 04/30/22 1730 04/30/22 1734 04/30/22 1955 05/01/22 0431 05/02/22 0502 05/03/22 0334 05/04/22 0449 05/05/22  8466 05/06/22 0544  PROCALCITON 0.24  --   --   --   --   --  1.49 0.90  --   --   --   --   WBC  --   --   --   --    < >  --  14.6* 13.5* 13.3* 9.2 9.4 14.0*  LATICACIDVEN  --    < > 2.2* 4.2*  --  2.8* 2.7*  --   --   --   --   --    < > = values in this interval not displayed.     Liver Function Tests: Recent Labs  Lab 04/30/22 0550 04/30/22 1734 05/02/22 0502 05/03/22 0334  AST 702* 1,750* 434* 336*  ALT 657* 1,349* 972* 1,002*  ALKPHOS 40 38 39 34*  BILITOT 1.4* 1.7* 1.3* 1.3*  PROT 6.0* 5.9* 5.1* 5.7*  ALBUMIN 3.3* 3.1* 2.6* 2.9*    No results for input(s): LIPASE, AMYLASE in the last 168 hours. No results for input(s): AMMONIA in the last 168 hours.  ABG    Component Value Date/Time   PHART 7.42 05/03/2022 1606   PCO2ART 47 05/03/2022 1606   PO2ART 69 (L) 05/03/2022 1606   HCO3 30.5 (H) 05/03/2022 1606   ACIDBASEDEF 1.9 04/30/2022 0804   O2SAT 95 05/03/2022 1606     Coagulation Profile: Recent Labs  Lab 04/30/22 0332 04/30/22 2109  INR 2.1* 3.1*     Cardiac Enzymes: No results for input(s): CKTOTAL, CKMB, CKMBINDEX, TROPONINI in the last 168 hours.  HbA1C: Hemoglobin A1C  Date/Time Value Ref Range Status  12/21/2015 12:00 AM 6.3  Final   Hgb A1c MFr Bld  Date/Time Value Ref Range Status  04/30/2022 05:46 AM 6.9 (H) 4.8 - 5.6 % Final    Comment:    (NOTE) Pre diabetes:          5.7%-6.4%  Diabetes:               >6.4%  Glycemic control for   <7.0% adults with diabetes     CBG: Recent Labs  Lab 05/05/22 2325 05/06/22 0412 05/06/22 0744 05/06/22 1117 05/06/22 1556  GLUCAP 128* 150* 149* 103* 102*      DVT/GI PRX  assessed I Assessed the need for Labs I Assessed the need for Foley I Assessed the need for Central Venous Line Family Discussion when available I Assessed the need for Mobilization I made an Assessment of medications to be adjusted accordingly Safety Risk assessment completed  CASE DISCUSSED IN MULTIDISCIPLINARY ROUNDS WITH ICU TEAM  Long conversation with family at bedside  Critical Care Time 45 min

## 2022-05-07 DIAGNOSIS — I161 Hypertensive emergency: Secondary | ICD-10-CM | POA: Diagnosis not present

## 2022-05-07 DIAGNOSIS — I5020 Unspecified systolic (congestive) heart failure: Secondary | ICD-10-CM | POA: Diagnosis not present

## 2022-05-07 DIAGNOSIS — G7001 Myasthenia gravis with (acute) exacerbation: Secondary | ICD-10-CM

## 2022-05-07 DIAGNOSIS — I468 Cardiac arrest due to other underlying condition: Secondary | ICD-10-CM | POA: Diagnosis not present

## 2022-05-07 DIAGNOSIS — I480 Paroxysmal atrial fibrillation: Secondary | ICD-10-CM | POA: Diagnosis not present

## 2022-05-07 DIAGNOSIS — J989 Respiratory disorder, unspecified: Secondary | ICD-10-CM | POA: Diagnosis not present

## 2022-05-07 DIAGNOSIS — J810 Acute pulmonary edema: Secondary | ICD-10-CM | POA: Diagnosis not present

## 2022-05-07 LAB — BASIC METABOLIC PANEL
Anion gap: 8 (ref 5–15)
BUN: 41 mg/dL — ABNORMAL HIGH (ref 8–23)
CO2: 31 mmol/L (ref 22–32)
Calcium: 8 mg/dL — ABNORMAL LOW (ref 8.9–10.3)
Chloride: 108 mmol/L (ref 98–111)
Creatinine, Ser: 1.03 mg/dL (ref 0.61–1.24)
GFR, Estimated: >60 mL/min (ref >60)
Glucose, Bld: 149 mg/dL — ABNORMAL HIGH (ref 70–99)
Potassium: 4.5 mmol/L (ref 3.5–5.1)
Sodium: 147 mmol/L — ABNORMAL HIGH (ref 135–145)

## 2022-05-07 LAB — CBC
HCT: 48.6 % (ref 39.0–52.0)
Hemoglobin: 14.5 g/dL (ref 13.0–17.0)
MCH: 29.8 pg (ref 26.0–34.0)
MCHC: 29.8 g/dL — ABNORMAL LOW (ref 30.0–36.0)
MCV: 99.8 fL (ref 80.0–100.0)
Platelets: 128 10*3/uL — ABNORMAL LOW (ref 150–400)
RBC: 4.87 MIL/uL (ref 4.22–5.81)
RDW: 14.1 % (ref 11.5–15.5)
WBC: 10.7 10*3/uL — ABNORMAL HIGH (ref 4.0–10.5)
nRBC: 0 % (ref 0.0–0.2)

## 2022-05-07 LAB — PHOSPHORUS: Phosphorus: 4.6 mg/dL (ref 2.5–4.6)

## 2022-05-07 LAB — GLUCOSE, CAPILLARY
Glucose-Capillary: 123 mg/dL — ABNORMAL HIGH (ref 70–99)
Glucose-Capillary: 127 mg/dL — ABNORMAL HIGH (ref 70–99)
Glucose-Capillary: 128 mg/dL — ABNORMAL HIGH (ref 70–99)
Glucose-Capillary: 131 mg/dL — ABNORMAL HIGH (ref 70–99)
Glucose-Capillary: 141 mg/dL — ABNORMAL HIGH (ref 70–99)
Glucose-Capillary: 164 mg/dL — ABNORMAL HIGH (ref 70–99)
Glucose-Capillary: 170 mg/dL — ABNORMAL HIGH (ref 70–99)
Glucose-Capillary: 200 mg/dL — ABNORMAL HIGH (ref 70–99)

## 2022-05-07 LAB — MAGNESIUM: Magnesium: 2.3 mg/dL (ref 1.7–2.4)

## 2022-05-07 MED ORDER — INSULIN ASPART 100 UNIT/ML IJ SOLN
0.0000 [IU] | Freq: Three times a day (TID) | INTRAMUSCULAR | Status: DC
  Administered 2022-05-07: 4 [IU] via SUBCUTANEOUS
  Administered 2022-05-07: 3 [IU] via SUBCUTANEOUS
  Administered 2022-05-08: 7 [IU] via SUBCUTANEOUS
  Administered 2022-05-08: 3 [IU] via SUBCUTANEOUS
  Administered 2022-05-08: 7 [IU] via SUBCUTANEOUS
  Administered 2022-05-08: 4 [IU] via SUBCUTANEOUS
  Administered 2022-05-09: 11 [IU] via SUBCUTANEOUS
  Administered 2022-05-09: 4 [IU] via SUBCUTANEOUS
  Administered 2022-05-09: 3 [IU] via SUBCUTANEOUS
  Administered 2022-05-10: 7 [IU] via SUBCUTANEOUS
  Administered 2022-05-10: 4 [IU] via SUBCUTANEOUS
  Administered 2022-05-10: 3 [IU] via SUBCUTANEOUS
  Administered 2022-05-11 (×2): 4 [IU] via SUBCUTANEOUS
  Administered 2022-05-11 – 2022-05-12 (×2): 3 [IU] via SUBCUTANEOUS
  Administered 2022-05-12 – 2022-05-13 (×4): 4 [IU] via SUBCUTANEOUS
  Administered 2022-05-13: 3 [IU] via SUBCUTANEOUS
  Administered 2022-05-14: 4 [IU] via SUBCUTANEOUS
  Administered 2022-05-14: 3 [IU] via SUBCUTANEOUS
  Administered 2022-05-14 (×2): 4 [IU] via SUBCUTANEOUS
  Administered 2022-05-15: 7 [IU] via SUBCUTANEOUS
  Administered 2022-05-15: 3 [IU] via SUBCUTANEOUS
  Administered 2022-05-16 – 2022-05-17 (×3): 4 [IU] via SUBCUTANEOUS
  Administered 2022-05-17: 3 [IU] via SUBCUTANEOUS
  Filled 2022-05-07 (×29): qty 1

## 2022-05-07 MED ORDER — SACUBITRIL-VALSARTAN 24-26 MG PO TABS
1.0000 | ORAL_TABLET | Freq: Two times a day (BID) | ORAL | Status: DC
Start: 2022-05-07 — End: 2022-05-07
  Filled 2022-05-07: qty 1

## 2022-05-07 NOTE — Progress Notes (Signed)
Per neurology's note, the patient's daughter shared concerns that the patient was having suicidal ideation. Upon bedside assessment, patient appropriate alert and oriented. When asked whether he was having any feelings of wanting to harm himself he denied this. He stated he gets "frustrated but that's human nature".  He was asked if he had been feeling sad or down, any feelings of hopelessness during this admission. He denied this stating he is getting better and making progress. Let the patient know we are here for any emotional support he might need.  No pysch consult needed at this time. Please continue to monitor patient's neuro and psychosocial status.   Domingo Pulse Rust-Chester, AGACNP-BC Acute Care Nurse Practitioner [facility]   [phone] / [phone] Please see Amion for pager details.

## 2022-05-07 NOTE — Progress Notes (Signed)
Progress Note  Patient Name: Joseph Macdonald Date of Encounter: 05/07/2022  [facility] Cardiologist: [name], MD   Subjective   Feeling much better.  No chest pain.  Breathing is improving.  Inpatient Medications    Scheduled Meds:  apixaban  5 mg Oral BID   budesonide (PULMICORT) nebulizer solution  0.25 mg Nebulization BID   chlorhexidine  15 mL Mouth Rinse BID   Chlorhexidine Gluconate Cloth  6 each Topical Daily   diazepam  5 mg Intravenous Q8H   insulin aspart  0-20 Units Subcutaneous Q4H   ipratropium-albuterol  3 mL Nebulization BID   mouth rinse  15 mL Mouth Rinse q12n4p   methylPREDNISolone (SOLU-MEDROL) injection  20 mg Intravenous BID   OLANZapine zydis  5 mg Oral BID   pantoprazole (PROTONIX) IV  40 mg Intravenous Daily   pyridostigmine  30 mg Oral TID   sodium chloride flush  10-40 mL Intracatheter Q12H   Continuous Infusions:  nitroGLYCERIN Stopped (05/07/22 0910)   piperacillin-tazobactam (ZOSYN)  IV 12.5 mL/hr at 05/07/22 1000   PRN Meds: atropine, diazepam, hydrALAZINE, HYDROmorphone (DILAUDID) injection, polyethylene glycol, sodium chloride flush   Vital Signs    Vitals:   05/07/22 0700 05/07/22 0800 05/07/22 0900 05/07/22 1000  BP: (!) 141/95 (!) 148/81 (!) 151/87 (!) 149/79  Pulse: 87 62 74 96  Resp: '17 18 20 18  '$ Temp:  (!) 97.5 F (36.4 C)    TempSrc:  Axillary    SpO2: 100% 100% 100% 100%  Weight:        Intake/Output Summary (Last 24 hours) at 05/07/2022 1056 Last data filed at 05/07/2022 1000 Gross per 24 hour  Intake 649.23 ml  Output 2300 ml  Net -1650.77 ml      05/07/2022    3:45 AM 05/06/2022    5:00 AM  Last 3 Weights  Weight (lbs) 240 lb 4.8 oz 239 lb 10.2 oz  Weight (kg) 109 kg 108.7 kg      Telemetry    Mostly sinus rhythm.  Short run of atrial fibrillation.  PVCs.- Personally Reviewed  ECG    05/05/2022: Sinus rhythm.  Rate 66 bpm.  LAFB.  LVH.  Nonspecific T wave abnormalities. - Personally  Reviewed  Physical Exam   VS:  BP (!) 149/79   Pulse 96   Temp (!) 97.5 F (36.4 C) (Axillary)   Resp 18   Wt 109 kg   SpO2 100%   BMI 37.64 kg/m  , BMI Body mass index is 37.64 kg/m. GENERAL: Well-appearing.  No acute distress. HEENT: Pupils equal round and reactive, fundi not visualized, oral mucosa unremarkable NECK:  No jugular venous distention, waveform within normal limits, carotid upstroke brisk and symmetric, no bruits, no thyromegaly LUNGS: Bilateral rhonchi anteriorly. HEART:  RRR.  PMI not displaced or sustained,S1 and S2 within normal limits, no S3, no S4, no clicks, no rubs, no murmurs ABD:  Flat, positive bowel sounds normal in frequency in pitch, no bruits, no rebound, no guarding, no midline pulsatile mass, no hepatomegaly, no splenomegaly EXT:  2 plus pulses throughout, 1+ bilateral lower extremity edema, no cyanosis no clubbing SKIN:  No rashes no nodules NEURO:  Cranial nerves II through XII grossly intact, motor grossly intact throughout PSYCH:  Cognitively intact, oriented to person place and time   Labs    High Sensitivity Troponin:   Recent Labs  Lab 04/30/22 0332 04/30/22 0546 04/30/22 1734 04/30/22 1955  TROPONINIHS 373* 1,137* 2,844* 2,210*  Chemistry Recent Labs  Lab 04/30/22 1734 04/30/22 1955 05/02/22 0502 05/02/22 1519 05/03/22 0334 05/04/22 0449 05/05/22 0446 05/06/22 0544 05/07/22 0314  NA 141   < > 144  --  148*   < > 146* 147* 147*  K 4.5   < > 3.6  --  3.6   < > 3.9 4.0 4.5  CL 104   < > 108  --  110   < > 109 109 108  CO2 26   < > 27  --  32   < > '28 30 31  '$ GLUCOSE 170*   < > 143*  --  128*   < > 212* 159* 149*  BUN 44*   < > 47*  --  39*   < > 50* 55* 41*  CREATININE 3.18*   < > 1.30*  --  1.29*   < > 1.10 1.13 1.03  CALCIUM 7.4*   < > 7.4*  --  7.6*   < > 7.9* 8.1* 8.0*  MG  --    < > 2.6*   < >  --    < > 2.5* 2.6* 2.3  PROT 5.9*  --  5.1*  --  5.7*  --   --   --   --   ALBUMIN 3.1*  --  2.6*  --  2.9*  --   --   --    --   AST 1,750*  --  434*  --  336*  --   --   --   --   ALT 1,349*  --  972*  --  1,002*  --   --   --   --   ALKPHOS 38  --  39  --  34*  --   --   --   --   BILITOT 1.7*  --  1.3*  --  1.3*  --   --   --   --   GFRNONAA 20*   < > 57*  --  58*   < > >60 >60 >60  ANIONGAP 11   < > 9  --  6   < > '9 8 8   '$ < > = values in this interval not displayed.    Lipids No results for input(s): CHOL, TRIG, HDL, LABVLDL, LDLCALC, CHOLHDL in the last 168 hours.  Hematology Recent Labs  Lab 05/05/22 0446 05/06/22 0544 05/07/22 0314  WBC 9.4 14.0* 10.7*  RBC 4.63 4.84 4.87  HGB 13.7 14.2 14.5  HCT 45.5 47.0 48.6  MCV 98.3 97.1 99.8  MCH 29.6 29.3 29.8  MCHC 30.1 30.2 29.8*  RDW 14.1 14.0 14.1  PLT 84* 105* 128*   Thyroid  No results for input(s): TSH, FREET4 in the last 168 hours.   BNP No results for input(s): BNP, PROBNP in the last 168 hours.   DDimer No results for input(s): DDIMER in the last 168 hours.   Radiology    No results found.  Cardiac Studies   Echo 04/30/2022:  1. Left ventricular ejection fraction, by estimation, is 35 to 40%. The  left ventricle has moderately decreased function. The left ventricle  demonstrates global hypokinesis. There is mild left ventricular  hypertrophy. Left ventricular diastolic  parameters are consistent with Grade I diastolic dysfunction (impaired  relaxation).   2. Right ventricular systolic function is moderately reduced. The right  ventricular size is mildly enlarged.   3. The mitral valve is normal in structure. No evidence  of mitral valve  regurgitation.   4. The aortic valve is grossly normal. Aortic valve regurgitation is not  visualized.   Echo 04/20/22:  1. Challenging study, PVCs noted.   2. Left ventricular ejection fraction, by estimation, is 45 to 50%. The  left ventricle has mildly decreased function. The left ventricle has no  regional wall motion abnormalities. There is mild left ventricular  hypertrophy. Left  ventricular diastolic  parameters are consistent with Grade I diastolic dysfunction (impaired  relaxation).   3. Right ventricular systolic function is mildly reduced. The right  ventricular size is normal. Tricuspid regurgitation signal is inadequate  for assessing PA pressure.   4. The mitral valve is normal in structure. Mild to moderate mitral valve  regurgitation. No evidence of mitral stenosis.   5. The aortic valve is normal in structure. Aortic valve regurgitation is  mild. No aortic stenosis is present.   6. There is borderline dilatation of the aortic root, measuring 37 mm.  There is mild dilatation of the ascending aorta, measuring 42 mm.   7. The inferior vena cava is normal in size with greater than 50%  respiratory variability, suggesting right atrial pressure of 3 mmHg.   Patient Profile     76 y.o. male with chronic systolic and diastolic heart failure, PAF, COPD, myasthenia gravis admitted with PEA arrest in the setting of acute on chronic systolic diastolic heart failure and hypoxic respiratory failure in the setting of not taking his diuretics.  Hospitalization also complicated by acute on chronic renal failure.  Assessment & Plan    # Acute on chronic systolic and diastolic heart failure:  LVEF this admission 35 to 40%, down from 45 to 50% previously.  Holding further diuresis in the setting of hyponatremia.  Start Entresto and BiDil once able to tolerate oral medications and his blood pressure allows.  No beta-blockers given that he has myasthenia gravis.  BP is stable off IV nitroglycerin.  Spoke with neurologist, Dr. Quinn Axe.  It is possible he will need IVIG to treat acute myasthenia if his weakness does not improve with time.  If necessary we will need to increase diuresis to manage the volume overload.  She would like to give him a couple days to see if his strength improves with time.  #PEA arrest: Occurred in the setting of decompensated heart failure and  respiratory failure.  He has known atrial fibrillation and PVCs.  Flecainide currently being held.  QTc 459 ms.  Patient was intubated and extubated 5/24.  He had to be reintubated.  Family elected for one-way extubation 5/27 and he has done well.  He will need a repeat ischemic evaluation once more medically stable.  #Hypertensive emergency: Blood pressures after extubation were in the 751W systolic.  He was started on nitroglycerin drip.  This has been discontinued and he is doing much better.  Currently not on any antihypertensives but would start low-dose Entresto once medically stable.  Could also add BiDil if needed. Avoid beta-blocker in the setting of myasthenia gravis.  # PAF:  Currently in sinus rhythm as above.  He did have some short paroxysms of atrial fibrillation.  No beta-blockers in the setting of myasthenia gravis.  No calcium channel blockers in the setting of systolic dysfunction.  If necessary would use amiodarone or digoxin for rate control.  Continue Eliquis.  Total critical care time: 50 minutes. Critical care time was exclusive of separately billable procedures and treating other patients. Critical care was necessary to  treat or prevent imminent or life-threatening deterioration. Critical care was time spent personally by me on the following activities: development of treatment plan with patient and/or surrogate as well as nursing, discussions with consultants, evaluation of patient's response to treatment, examination of patient, obtaining history from patient or surrogate, ordering and performing treatments and interventions, ordering and review of laboratory studies, ordering and review of radiographic studies, pulse oximetry and re-evaluation of patient's condition.       For questions or updates, please contact [place] Please consult [email or web address] for contact info under        Signed, Skeet Latch, MD  05/07/2022, 10:56 AM

## 2022-05-07 NOTE — Progress Notes (Addendum)
S: Patient was successfully extubated yesterday. He reports diplopia. No other neurologic complaints today.  Of note, daughter reported patient expressed suicidal ideation. Patient denied any sx depression or thoughts of suicide to me.   O:   Vitals:   05/07/22 2000 05/07/22 2100  BP: (!) 165/89 (!) 158/79  Pulse: 80 74  Resp: 13 15  Temp: 98.2 F (36.8 C)   SpO2: 99% 97%   Gen: lying in bed, comfortable Resp: normal WOB at rest but dyspneic during strength exam. Only able to count to 5 on single exhale. CV: RRR  Neurologic exam MS: A&Ox3, follows multi-step commands Speech: moderate dysarthria, able to name and repeat CN: PERRL, VFF by confrontation, EOMI with mildly fatigable upgaze R>L, minimal R ptosis, sensation intact, face symmetric at rest, hearing intact to voice. Marked bulbar weakness on tongue protrusion and cheek puff bilat. Neck flex/ext 4-/5 Motor: 4/5 diffusely BUE, mildly fatigable. 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 He shows multiple findings on examination c/w at least mild myasthenia exacerbation including mild fatigable upgaze and ptosis, weak neck flex/ext, bulbar weakness, and inability to count beyond 5 on single breath exhale. He also reports diplopia. Given that he was just extubated yesterday I would like to observe him for one more day. If he is not significantly improved from a strength and respiratory standpoint tomorrow I think it would be reasonable to treat with IVIG for MG flare. I discussed this with Dr. Oval Linsey of cardiology today. Cards is amenable to this plan and will work with [place] to more aggressively diurese him during IVIG tx to offset the volume load.   - Check NIFs today - Will reassess tomorrow and make further recommendations regarding possible IVIG - Of note, daughter reported  patient expressed suicidal ideation. Patient denied any sx depression or thoughts of suicide to me. Recommend psychiatry consult.  Su Monks, MD [facility] [phone]  If 7pm- 7am, please page neurology on call as listed in [place].

## 2022-05-07 NOTE — Progress Notes (Signed)
NIF - 36 

## 2022-05-07 NOTE — Consult Note (Signed)
PHARMACY CONSULT NOTE  Pharmacy Consult for Electrolyte Monitoring and Replacement   Recent Labs: Potassium (mmol/L)  Date Value  05/07/2022 4.5   Magnesium (mg/dL)  Date Value  05/07/2022 2.3   Calcium (mg/dL)  Date Value  05/07/2022 8.0 (L)   Albumin (g/dL)  Date Value  05/03/2022 2.9 (L)  04/03/2022 4.1   Phosphorus (mg/dL)  Date Value  05/07/2022 4.6   Sodium (mmol/L)  Date Value  05/07/2022 147 (H)  04/03/2022 149 (H)   Assessment: Patient is a 75 y/o M with medical history including Afib on Eliquis, COPD, combined systolic and diastolic CHF, X9KW, HLD, HTN, myasthenia gravis, ventricular tachycardia who presented to the ED 5/21 early AM with chest pain and respiratory distress. Patient experienced cardiac arrest with attainment of ROSC after 8 minutes of CPR. Patient is currently intubated, sedated, and on mechanical ventilation in the ICU. Pharmacy consulted to assist with electrolyte monitoring and replacement as indicated.  Extubated 5/24, re-intubated 5/24  Nutrition: Tube feeds, free water 2m q4h  Scr stable; 1.13>1.03  Goal of Therapy:  Electrolytes within normal limits  Plan:  --Na 147, continue to monitor, no pharmacological intervention recommended currently --Follow-up electrolytes with AM labs tomorrow --Avoid IV magnesium if possible given history of myasthenia gravis  WPearla Dubonnet5/28/2023 8:29 AM

## 2022-05-07 NOTE — Progress Notes (Signed)
NAME:  Joseph Macdonald, MRN:  [id number], DOB:  1946-03-16, LOS: 7 ADMISSION DATE:  04/30/2022, CONSULTATION DATE:  04/30/22 REFERRING MD:  Dr. Alfred Levins, CHIEF COMPLAINT:  Respiratory distress   History of Present Illness:  76 yo M presenting to [facility] ED via EMS from home on 04/30/22 in respiratory distress. Per documentation patient was found by EMS hypoxic with SpO2 71%-88% placed on NRB with albuterol administered. Family reports the patient was in his normal state of health but had NOT been taking his Lasix prescription. They believe he was taking the rest of his medication as prescribed but did not take his nighttime medications on Saturday. Family denies fever/chills, vomiting, slurred speech, weakness. They did report he had some nausea, one episode of diarrhea and mild nasal congestion as well as a non productive cough. They report he has chronic shortness of breath which started to appear more severe on Saturday.  They confirmed that the patient does not wear oxygen at home. Per report he quit smoking in 2018 after a 50 pack year history, drinks ETOH occasionally and has no recreational drug use history.   ED course: Upon arrival, per EDP report the patient was in severe respiratory distress and "purple". Before they were able to intervene, patient lost pulses. He was not on telemetry yet and it was unclear what was the initial rhythm. Patient received CPR for 8 minutes no defibrillations, and was emergently intubated requiring mechanical ventilatory support. Upon intubation, EDP noted frothy respiratory secretions. ROSC obtained and patient extremely hypertensive with SBP in the 200's. Lasix given and propofol started. SBP subsequently dropped into the 60's, propofol stopped and levophed started. EKG showing some inferolateral STE, Dr. Alfred Levins spoke with STEMI cardiologist Dr. Fletcher Anon who reviewed EKG and determined the patient did not meet STEMI criteria. Medications given: 80 mg Lasix IV,  propofol started then stopped, levophed drip started  Significant labs: (Labs/ Imaging personally reviewed) I, Domingo Pulse [place], AGACNP-BC, personally viewed and interpreted this ECG. EKG Interpretation: Date: 04/30/22, EKG Time: 03:38, Rate: 63, Rhythm: NSR, QRS Axis:  LAD, Intervals: 1st degree HB, ST/T Wave abnormalities: Inferolateral STE, non specific T wave inversions , Narrative Interpretation: NSR with 1st dgree HB & LAD with inferolateral STE that does not meet STEMI criteria per cardiology review Chemistry: pending Hematology: mild leukocytosis WBC: 12.4, Hgb: 16.6,  Troponin: 373, BNP: 971.6, lactic/PCT: pending,  COVID-19 & Influenza A/B: pending ABG: pending CXR 04/30/22: Mild interstitial pulmonary edema  PCCM consulted for admission due to acute hypoxic respiratory failure and subsequent cardiac arrest with circulatory shock requiring mechanical ventilation and vasopressor support.  Pertinent  Medical History  A-fib on Eliquis COPD Combined systolic & diastolic CHF N6EX HLD HTN Myasthenia gravis Ventricular Tachycardia   Micro Data:  5/21: SARS-CoV-2 and influenza PCR>> negative 5/21: Respiratory viral panel>> negative 5/21: MRSA PCR>> negative 5/21: Tracheal aspirate>> normal respiratory flora  Antimicrobials:  Unasyn 5/21>> Azithromycin 5/21>> 5/22  Significant Hospital Events: Including procedures, antibiotic start and stop dates in addition to other pertinent events   04/30/22: Admit to ICU s/p acute hypoxic respiratory failure and subsequent cardiac arrest with emergent intubation requiring mechanical ventilatory support. 5/22 remains intubated, on vent.  Code Status changed to DNR.  Repeat CTH negative, EEG without evidence of seizures 5/23: Patient withdraws from pain in all extremities with intermittent agitation, plan for MRI Brain & CT Abdomen 5/24: WUA with plans for extubation 5/24 failed extubation, LEFT lung white out on CXR 5/24  re-intubated, severe hypoxia, s/p  bronch removed bloody mucus plugs 5/25 remains on vent 5/26 stable on vent overnight 5/27 extubated  Interim History / Subjective:  Severe hypoxia Failed extubation 5/24 Remains critically ill Prognosis is very poor       Objective   Blood pressure (!) 163/82, pulse 63, temperature (!) 97.5 F (36.4 C), temperature source Oral, resp. rate 13, weight 109 kg, SpO2 96 %.        Intake/Output Summary (Last 24 hours) at 05/07/2022 1826 Last data filed at 05/07/2022 1700 Gross per 24 hour  Intake 1295.39 ml  Output 1500 ml  Net -204.61 ml    Filed Weights   05/06/22 0500 05/07/22 0345  Weight: 108.7 kg 109 kg    REVIEW OF SYSTEMS  PATIENT IS UNABLE TO PROVIDE COMPLETE REVIEW OF SYSTEMS DUE TO SEVERE CRITICAL ILLNESS AND TOXIC METABOLIC ENCEPHALOPATHY    PHYSICAL EXAMINATION:  GENERAL:critically ill appearing, +resp distress EYES: Pupils equal, round, reactive to light.  No scleral icterus.  MOUTH: Moist mucosal membrane.  NECK: Supple.  PULMONARY: +rhonchi, +wheezing CARDIOVASCULAR: S1 and S2.  No murmurs  GASTROINTESTINAL: Soft, nontender, -distended. Positive bowel sounds.  MUSCULOSKELETAL: No swelling, clubbing, or edema.  NEUROLOGIC: AAO3 SKIN:intact,warm,dry     Assessment & Plan:  76 yo white male with  PEA Cardiac arrest: suspect pulmonary etiology due to pneumonia with Elevated Troponin in setting of Demand Ischemia /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 Paroxsymal Atrial Fibrillation failed trial of extubation due to lung collapse and inability to protect airway with severe resp insufficiency with underlying MG   Severe ACUTE Hypoxic and Hypercapnic Respiratory Failure Aspiration? HCAP -Extubated   -maintain rate and afterload reduction after extubation to prevent diastolic pulm edema -remains on Zosyn -lower dose steroid -Still weak but doing well perhaps MG -Neuro considering  IVIG  ACUTE SYSTOLIC CARDIAC FAILURE- EF 20% 8 minutes downtime, CPR initiated immediately, received no defibrillations (deemed not a candidate for hypothermia protocol) -Allow to re-equilibrate off lasix again today, -6L+ since admit -follow up cardiac enzymes as indicated -now on DOAC -Cardiology following, appreciate input -May resume diuresis as BP and renal function permits -Echocardiogram 5/21: LVEF 35-40%, Grade I DD, RV systolic function moderately reduced   Myasthenia Gravis query crisis -Neurology following to assist with myasthenia gravis management and medication regimen -perhaps IVIG per Dr. Quinn Axe  ACUTE KIDNEY INJURY/Renal Failure Lab Results  Component Value Date   CREATININE 1.03 05/07/2022   BUN 41 (H) 05/07/2022   NA 147 (H) 05/07/2022   K 4.5 05/07/2022   CL 108 05/07/2022   CO2 31 05/07/2022    -AKI resolved -Na higher -continue Foley Catheter-assess need -Avoid nephrotoxic agents -Follow urine output, BMP -Ensure adequate renal perfusion, optimize oxygenation -Renal dose medications   Intake/Output Summary (Last 24 hours) at 05/07/2022 1826 Last data filed at 05/07/2022 1700 Gross per 24 hour  Intake 1295.39 ml  Output 1500 ml  Net -204.61 ml   Net IO Since Admission: -6,443.52 mL [05/07/22 1826]      Latest Ref Rng & Units 05/07/2022    3:14 AM 05/06/2022    5:44 AM 05/05/2022    4:46 AM  BMP  Glucose 70 - 99 mg/dL 149   159   212    BUN 8 - 23 mg/dL 41   55   50    Creatinine 0.61 - 1.24 mg/dL 1.03   1.13   1.10    Sodium 135 - 145 mmol/L 147   147  146    Potassium 3.5 - 5.1 mmol/L 4.5   4.0   3.9    Chloride 98 - 111 mmol/L 108   109   109    CO2 22 - 32 mmol/L '31   30   28    '$ Calcium 8.9 - 10.3 mg/dL 8.0   8.1   7.9      NEUROLOGY -Anxiolysis  -Maintain prn Dilaudid and prn Valium  -Maintain ODT Zyprexa for now  ENDO - ICU hypoglycemic\Hyperglycemia protocol -check FSBS per protocol   GI GI PROPHYLAXIS as  indicated  NUTRITIONAL STATUS DIET-->TF's held OGT out Constipation protocol as indicated   ELECTROLYTES -follow labs as needed -replace as needed -pharmacy consultation and following   Transaminitis in the setting of cardiac arrest   Lab Results  Component Value Date   AST 336 (H) 05/03/2022   AST 434 (H) 05/02/2022   AST 1,750 (H) 04/30/2022   Lab Results  Component Value Date   ALT 1,002 (H) 05/03/2022   ALT 972 (H) 05/02/2022   ALT 1,349 (H) 04/30/2022     CT Abd Pelvis 05/02/22 revealed no focal liver abnormality is seen. No gallstones, gallbladder wall thickening, or biliary dilatation, gallbladder wall thickening, or biliary dilatation. - Trend hepatic function - Avoid hepatotoxic agents   Best Practice (right click and "Reselect all SmartList Selections" daily)  Diet/type: NPO w/ meds via tube DVT prophylaxis: Heparin gtt GI prophylaxis: PPI Lines: Central line, Arterial Line, and yes and it is still needed Foley:  Yes, and it is still needed Code Status: Limited Code (Mechanical Intubation Only)   Patient's wife at bedside and updated regarding plan of care  multiple times Labs   CBC: Recent Labs  Lab 05/03/22 0334 05/04/22 0449 05/05/22 0446 05/06/22 0544 05/07/22 0314  WBC 13.3* 9.2 9.4 14.0* 10.7*  HGB 14.8 14.1 13.7 14.2 14.5  HCT 47.9 47.2 45.5 47.0 48.6  MCV 96.4 99.0 98.3 97.1 99.8  PLT 90* 82* 84* 105* 128*     Basic Metabolic Panel: Recent Labs  Lab 05/02/22 1519 05/03/22 0334 05/04/22 0449 05/05/22 0446 05/06/22 0544 05/07/22 0314  NA  --  148* 148* 146* 147* 147*  K  --  3.6 3.8 3.9 4.0 4.5  CL  --  110 111 109 109 108  CO2  --  32 '30 28 30 31  '$ GLUCOSE  --  128* 137* 212* 159* 149*  BUN  --  39* 43* 50* 55* 41*  CREATININE  --  1.29* 1.28* 1.10 1.13 1.03  CALCIUM  --  7.6* 7.3* 7.9* 8.1* 8.0*  MG 2.5*  --  2.5* 2.5* 2.6* 2.3  PHOS 3.0  --  3.6 3.7 4.8* 4.6    GFR: Estimated Creatinine Clearance: 73 mL/min (by C-G  formula based on SCr of 1.03 mg/dL). Recent Labs  Lab 04/30/22 1955 05/01/22 0431 05/01/22 0431 05/02/22 0502 05/03/22 0334 05/04/22 0449 05/05/22 0446 05/06/22 0544 05/07/22 0314  PROCALCITON  --  1.49  --  0.90  --   --   --   --   --   WBC  --  14.6*   < > 13.5*   < > 9.2 9.4 14.0* 10.7*  LATICACIDVEN 2.8* 2.7*  --   --   --   --   --   --   --    < > = values in this interval not displayed.     Liver Function Tests: Recent Labs  Lab 05/02/22 0502  05/03/22 0334  AST 434* 336*  ALT 972* 1,002*  ALKPHOS 39 34*  BILITOT 1.3* 1.3*  PROT 5.1* 5.7*  ALBUMIN 2.6* 2.9*    No results for input(s): LIPASE, AMYLASE in the last 168 hours. No results for input(s): AMMONIA in the last 168 hours.  ABG    Component Value Date/Time   PHART 7.42 05/03/2022 1606   PCO2ART 47 05/03/2022 1606   PO2ART 69 (L) 05/03/2022 1606   HCO3 30.5 (H) 05/03/2022 1606   ACIDBASEDEF 1.9 04/30/2022 0804   O2SAT 95 05/03/2022 1606     Coagulation Profile: Recent Labs  Lab 04/30/22 2109  INR 3.1*     Cardiac Enzymes: No results for input(s): CKTOTAL, CKMB, CKMBINDEX, TROPONINI in the last 168 hours.  HbA1C: Hemoglobin A1C  Date/Time Value Ref Range Status  12/21/2015 12:00 AM 6.3  Final   Hgb A1c MFr Bld  Date/Time Value Ref Range Status  04/30/2022 05:46 AM 6.9 (H) 4.8 - 5.6 % Final    Comment:    (NOTE) Pre diabetes:          5.7%-6.4%  Diabetes:              >6.4%  Glycemic control for   <7.0% adults with diabetes     CBG: Recent Labs  Lab 05/07/22 0008 05/07/22 0341 05/07/22 0742 05/07/22 1131 05/07/22 1557  GLUCAP 123* 131* 128* 164* 200*      DVT/GI PRX  assessed I Assessed the need for Labs I Assessed the need for Foley I Assessed the need for Central Venous Line Family Discussion when available I Assessed the need for Mobilization I made an Assessment of medications to be adjusted accordingly Safety Risk assessment completed  CASE DISCUSSED IN  MULTIDISCIPLINARY ROUNDS WITH ICU TEAM  Long conversation with family at bedside

## 2022-05-07 NOTE — Evaluation (Signed)
Clinical/Bedside Swallow Evaluation Patient Details  Name: Joseph Macdonald MRN: [id number] Date of Birth: 04/14/1946  Today's Date: 05/07/2022 Time: SLP Start Time (ACUTE ONLY): 0850 SLP Stop Time (ACUTE ONLY): 0910 SLP Time Calculation (min) (ACUTE ONLY): 20 min  Past Medical History:  Past Medical History:  Diagnosis Date   Arthritis    COPD (chronic obstructive pulmonary disease) ([place])    Diabetes mellitus without complication ([place])    diet controlled   Dyspnea    with activity   Hyperlipidemia    Hypertension    Myasthenia gravis ([place])    Oxygen deficit    Ventricular tachycardia [facility])    Past Surgical History:  Past Surgical History:  Procedure Laterality Date   carpel tunnel sx     CATARACT EXTRACTION     right eye   CATARACT EXTRACTION W/PHACO Left 12/01/2020   Procedure: CATARACT EXTRACTION PHACO AND INTRAOCULAR LENS PLACEMENT (IOC) LEFT 10.83 01:14.1 14.6%;  Surgeon: Leandrew Koyanagi, MD;  Location: [place];  Service: Ophthalmology;  Laterality: Left;   RETINAL DETACHMENT SURGERY     SKIN GRAFT     Behind left knee   TONSILLECTOMY AND ADENOIDECTOMY     HPI:  Per H&P "76 yo M presenting to [facility] ED via EMS from home on 04/30/22 in respiratory distress. Per documentation patient was found by EMS hypoxic with SpO2 71%-88% placed on NRB with albuterol administered.  Family reports the patient was in his normal state of health but had NOT been taking his Lasix prescription. They believe he was taking the rest of his medication as prescribed but did not take his nighttime medications on Saturday.  Family denies fever/chills, vomiting, slurred speech, weakness. They did report he had some nausea, one episode of diarrhea and mild nasal congestion as well as a non productive cough. They report he has chronic shortness of breath which started to appear more severe on Saturday.  They confirmed that the patient does not wear oxygen at home. Per report he quit smoking  in 2018 after a 50 pack year history, drinks ETOH occasionally and has no recreational drug use history.     ED course:  Upon arrival, per EDP report the patient was in severe respiratory distress and "purple". Before they were able to intervene, patient lost pulses. He was not on telemetry yet and it was unclear what was the initial rhythm. Patient received CPR for 8 minutes no defibrillations, and was emergently intubated requiring mechanical ventilatory support. Upon intubation, EDP noted frothy respiratory secretions. ROSC obtained and patient extremely hypertensive with SBP in the 200's. Lasix given and propofol started. SBP subsequently dropped into the 60's, propofol stopped and levophed started. EKG showing some inferolateral STE, Dr. Alfred Levins spoke with STEMI cardiologist Dr. Fletcher Anon who reviewed EKG and determined the patient did not meet STEMI criteria."    Assessment / Plan / Recommendation  Clinical Impression  Pt seen for clinical swallowing evaluation. Pt alert, pleasant, and cooperative. On 6L/min O2 via [place]. Daughter, son, and daughter-in-law at bedside initially. Cleared with RN.  Oral motor examination completed and unremarkable with exception of a few missing teeth and xerostomia. Pt with strong, clear vocal quality.  Pt given trials of ice chips, water via straw (single and consecutive sips), pureed, and solid. Pt presents with s/sx mild oral dysphagia c/b mild oral residual with solid which cleared with liquid wash. Oral deficits likely secondary to xerostomia. Pharyngeal swallow appeared [facility] per clinical assessment. To palpation, pt with seemingly timely swallow  initiation and seemingly adequate laryngeal elevation. No overt or subtle s/sx pharyngeal dysphagia across trials. No change to vocal quality, respiratory rate, or SpO2 across trials. Pt fed self throughout evaluation; however, difficulty grasping spoon and cracker noted.   Recommend initiation of a mech soft diet with thin  liquids and safe swallowing strategies/aspiration precautions as outlined below including small/single and alternate bites/sips and rest breaks PRN for energy conservation. Pt would also benefit from assistance with feeding PRN given inconsistent difficulty with grasp.  Should pt exhibit any increased difficulty chewing/swallowing or changes to lung status concerning for aspiration/aspiration, please make pt NPO and notify SLP.   Pt is at increased risk for aspiration/aspiration PNA given respiratory status, recent/prolonged intubation, medical comorbidities, and deconditioned state.   SLP to f/u per POC for diet tolerance and trials of upgraded textures as appropriate.   Pt, pt's son, and RN made aware of results, recommendations, and SLP POC. Pt and son also made aware of role of SLP, rationale for swallowing evaluation, relationship between breathing/swallowing, and clinical risk factors for dysphagia. Pt and son verbalized understanding/agreement.   SLP Visit Diagnosis: Dysphagia, oral phase (R13.11)    Aspiration Risk  Mild aspiration risk    Diet Recommendation Dysphagia 3 (Mech soft);Thin liquid   Medication Administration: Crushed with puree Supervision: Patient able to self feed;Staff to assist with self feeding;Intermittent supervision to cue for compensatory strategies (promote self-feeding) Compensations: Slow rate;Small sips/bites;Follow solids with liquid Postural Changes: Seated upright at 90 degrees;Remain upright for at least 30 minutes after po intake    Other  Recommendations Recommended Consults:  (OT consult) Oral Care Recommendations: Oral care BID;Staff/trained caregiver to provide oral care (assistance PRN)    Recommendations for follow up therapy are one component of a multi-disciplinary discharge planning process, led by the attending physician.  Recommendations may be updated based on patient status, additional functional criteria and insurance  authorization.  Follow up Recommendations  (likely no f/u SLP services warranted)      Assistance Recommended at Discharge  (likely need for some level of assistance given deconditioning)  Functional Status Assessment    Frequency and Duration min 2x/week  2 weeks       Prognosis Prognosis for Safe Diet Advancement: Good      Swallow Study   General Date of Onset: 04/30/22 HPI: Per H&P "76 yo M presenting to [facility] ED via EMS from home on 04/30/22 in respiratory distress. Per documentation patient was found by EMS hypoxic with SpO2 71%-88% placed on NRB with albuterol administered.  Family reports the patient was in his normal state of health but had NOT been taking his Lasix prescription. They believe he was taking the rest of his medication as prescribed but did not take his nighttime medications on Saturday.  Family denies fever/chills, vomiting, slurred speech, weakness. They did report he had some nausea, one episode of diarrhea and mild nasal congestion as well as a non productive cough. They report he has chronic shortness of breath which started to appear more severe on Saturday.  They confirmed that the patient does not wear oxygen at home. Per report he quit smoking in 2018 after a 50 pack year history, drinks ETOH occasionally and has no recreational drug use history.     ED course:  Upon arrival, per EDP report the patient was in severe respiratory distress and "purple". Before they were able to intervene, patient lost pulses. He was not on telemetry yet and it was unclear what was the  initial rhythm. Patient received CPR for 8 minutes no defibrillations, and was emergently intubated requiring mechanical ventilatory support. Upon intubation, EDP noted frothy respiratory secretions. ROSC obtained and patient extremely hypertensive with SBP in the 200's. Lasix given and propofol started. SBP subsequently dropped into the 60's, propofol stopped and levophed started. EKG showing some  inferolateral STE, Dr. Alfred Levins spoke with STEMI cardiologist Dr. Fletcher Anon who reviewed EKG and determined the patient did not meet STEMI criteria." Type of Study: Bedside Swallow Evaluation Previous Swallow Assessment: unknown Diet Prior to this Study: NPO Temperature Spikes Noted: No Respiratory Status: Nasal cannula (6L/min O2 via [facility]) History of Recent Intubation: Yes Length of Intubations (days):  (2 seperate intubations: 5/21-5/24 & 5/24-5/27; 7 days total) Date extubated:  (5/27 - most recent extubation) Behavior/Cognition: Alert;Cooperative;Pleasant mood Oral Cavity - Dentition: Missing dentition Vision: Functional for self-feeding Self-Feeding Abilities: Able to feed self;Needs set up;Needs assist Patient Positioning: Upright in bed Baseline Vocal Quality: Normal Volitional Cough: Strong Volitional Swallow: Able to elicit    Oral/Motor/Sensory Function Overall Oral Motor/Sensory Function: Within functional limits   Ice Chips Ice chips: Within functional limits Presentation: Self Fed;Spoon Other Comments: x3 ice chips   Thin Liquid Thin Liquid: Within functional limits Presentation: Straw;Self Fed Other Comments: ~6 oz water via single and consecutive straw sips    Nectar Thick Nectar Thick Liquid: Not tested   Honey Thick Honey Thick Liquid: Not tested   Puree Puree: Within functional limits Presentation: Self Fed;Spoon Other Comments: ~2 oz   Solid     Solid: Impaired Oral Phase Functional Implications: Oral residue Pharyngeal Phase Impairments:  (appeared WFL) Other Comments: x2 cracker; residual cleared with liquid wash     Cherrie Gauze, M.S., [facility] [phone] (ASCOM)   Clearnce Sorrel Lorelie Biermann 05/07/2022,9:48 AM

## 2022-05-08 DIAGNOSIS — I468 Cardiac arrest due to other underlying condition: Secondary | ICD-10-CM | POA: Diagnosis not present

## 2022-05-08 DIAGNOSIS — J810 Acute pulmonary edema: Secondary | ICD-10-CM | POA: Diagnosis not present

## 2022-05-08 DIAGNOSIS — I480 Paroxysmal atrial fibrillation: Secondary | ICD-10-CM | POA: Diagnosis not present

## 2022-05-08 DIAGNOSIS — G7000 Myasthenia gravis without (acute) exacerbation: Secondary | ICD-10-CM | POA: Diagnosis not present

## 2022-05-08 DIAGNOSIS — Z7189 Other specified counseling: Secondary | ICD-10-CM | POA: Diagnosis not present

## 2022-05-08 DIAGNOSIS — J989 Respiratory disorder, unspecified: Secondary | ICD-10-CM | POA: Diagnosis not present

## 2022-05-08 DIAGNOSIS — I255 Ischemic cardiomyopathy: Secondary | ICD-10-CM | POA: Diagnosis not present

## 2022-05-08 DIAGNOSIS — Z515 Encounter for palliative care: Secondary | ICD-10-CM | POA: Diagnosis not present

## 2022-05-08 LAB — CBC
HCT: 49 % (ref 39.0–52.0)
Hemoglobin: 14.7 g/dL (ref 13.0–17.0)
MCH: 29.5 pg (ref 26.0–34.0)
MCHC: 30 g/dL (ref 30.0–36.0)
MCV: 98.4 fL (ref 80.0–100.0)
Platelets: 135 10*3/uL — ABNORMAL LOW (ref 150–400)
RBC: 4.98 MIL/uL (ref 4.22–5.81)
RDW: 13.6 % (ref 11.5–15.5)
WBC: 11.6 10*3/uL — ABNORMAL HIGH (ref 4.0–10.5)
nRBC: 0 % (ref 0.0–0.2)

## 2022-05-08 LAB — GLUCOSE, CAPILLARY
Glucose-Capillary: 124 mg/dL — ABNORMAL HIGH (ref 70–99)
Glucose-Capillary: 221 mg/dL — ABNORMAL HIGH (ref 70–99)
Glucose-Capillary: 227 mg/dL — ABNORMAL HIGH (ref 70–99)
Glucose-Capillary: 164 mg/dL — ABNORMAL HIGH (ref 70–99)

## 2022-05-08 LAB — BASIC METABOLIC PANEL
Anion gap: 8 (ref 5–15)
BUN: 32 mg/dL — ABNORMAL HIGH (ref 8–23)
CO2: 29 mmol/L (ref 22–32)
Calcium: 8 mg/dL — ABNORMAL LOW (ref 8.9–10.3)
Chloride: 103 mmol/L (ref 98–111)
Creatinine, Ser: 0.9 mg/dL (ref 0.61–1.24)
GFR, Estimated: >60 mL/min (ref >60)
Glucose, Bld: 137 mg/dL — ABNORMAL HIGH (ref 70–99)
Potassium: 4.2 mmol/L (ref 3.5–5.1)
Sodium: 140 mmol/L (ref 135–145)

## 2022-05-08 LAB — PHOSPHORUS: Phosphorus: 3.7 mg/dL (ref 2.5–4.6)

## 2022-05-08 LAB — MAGNESIUM: Magnesium: 2.1 mg/dL (ref 1.7–2.4)

## 2022-05-08 MED ORDER — DIAZEPAM 5 MG PO TABS
5.0000 mg | ORAL_TABLET | Freq: Four times a day (QID) | ORAL | Status: DC | PRN
  Administered 2022-05-09 – 2022-05-13 (×10): 5 mg via ORAL
  Filled 2022-05-08 (×12): qty 1

## 2022-05-08 MED ORDER — HYDROCODONE-ACETAMINOPHEN 5-325 MG PO TABS
1.0000 | ORAL_TABLET | Freq: Four times a day (QID) | ORAL | Status: DC | PRN
  Administered 2022-05-08 – 2022-05-17 (×25): 1 via ORAL
  Filled 2022-05-08 (×25): qty 1

## 2022-05-08 MED ORDER — ENSURE ENLIVE PO LIQD
237.0000 mL | Freq: Two times a day (BID) | ORAL | Status: DC
  Administered 2022-05-08 – 2022-05-17 (×11): 237 mL via ORAL

## 2022-05-08 MED ORDER — SODIUM CHLORIDE 0.9 % IV SOLN: INTRAVENOUS | Status: DC | PRN

## 2022-05-08 MED ORDER — ADULT MULTIVITAMIN W/MINERALS CH
1.0000 | ORAL_TABLET | Freq: Every day | ORAL | Status: DC
  Administered 2022-05-08 – 2022-05-18 (×11): 1 via ORAL
  Filled 2022-05-08 (×11): qty 1

## 2022-05-08 MED ORDER — SODIUM CHLORIDE 0.9% FLUSH
3.0000 mL | Freq: Two times a day (BID) | INTRAVENOUS | Status: DC
  Administered 2022-05-08 – 2022-05-18 (×17): 3 mL via INTRAVENOUS

## 2022-05-08 NOTE — Progress Notes (Signed)
Nutrition Follow-up  DOCUMENTATION CODES:   Obesity unspecified  INTERVENTION:   -Ensure Enlive po BID, each supplement provides 350 kcal and 20 grams of protein -MVI with minerals daily  NUTRITION DIAGNOSIS:   Inadequate oral intake related to inability to eat as evidenced by NPO status.  Progressing; advanced to PO diet on 05/07/22  GOAL:   Patient will meet greater than or equal to 90% of their needs  Progressing   MONITOR:   PO intake, Supplement acceptance  REASON FOR ASSESSMENT:   Ventilator    ASSESSMENT:   Pt presented with cardiac arrest. PMH HTN, HLD, MG, COPD, and arthritis.  5/21- intubated 5/22- TF initiated 5/23- TF stopped secondary to tan secretions coming out of tube 5/24- extubated, re-intubated, TF resumed, s/p bronch 5/27- extubated 5/28- s/p BSE- advanced to dysphagia 3 diet with thin liquids  Reviewed I/O's: +126 ml x 24 hours and -7 L since admission  UOP: 925 ml x 24 hours   Case discussed with RN, MD, and during ICU rounds. Pt is alert and oriented, currently on 2 L of oxygen. Plan to take out foley today. Per RN, suspect impaction secondary to liquid stool. Pt making slow progress with PT.   Per RN, pt with good appetite; he "cleared his plate" at breakfast. Noted meal completions documented at 50-90%.   Palliative care following for GOC; pt amenable to SNF placement.   Medications reviewed and include solu-medrol.   Lab Results  Component Value Date   HGBA1C 6.9 (H) 04/30/2022   PTA DM medications are .   Labs reviewed: CBGS: 010-932 (inpatient orders for glycemic control are 0-20 units insulin aspart TID with meals and at bedtime).    Diet Order:   Diet Order             DIET DYS 3 Room service appropriate? Yes with Assist; Fluid consistency: Thin  Diet effective now                   EDUCATION NEEDS:   No education needs have been identified at this time  Skin:  Skin Assessment: Skin Integrity Issues: Skin  Integrity Issues:: Other (Comment) Other: skin tear to rt lower arm, rt posterior elbow  Last BM:  05/08/22 (type 7)  Height:   Ht Readings from Last 1 Encounters:  04/03/22 '5\' 7"'$  (1.702 m)    Weight:   Wt Readings from Last 1 Encounters:  05/07/22 109 kg    Ideal Body Weight:  67.3 kg  BMI:  Body mass index is 37.64 kg/m.  Estimated Nutritional Needs:   Kcal:  2000-2200  Protein:  100-115 grams  Fluid:  > 2 L    Loistine Chance, RD, LDN, [place] Registered Dietitian II Certified Diabetes Care and Education Specialist Please refer to [facility] for RD and/or RD on-call/weekend/after hours pager

## 2022-05-08 NOTE — Evaluation (Signed)
Physical Therapy Evaluation Patient Details Name: Joseph Macdonald MRN: [id number] DOB: [birth year] Today's Date: 05/08/2022  History of Present Illness  Pt is a 76 yo M presenting to [facility] ED via EMS from home on 04/30/22 in respiratory distress. Per documentation patient was found by EMS hypoxic with SpO2 71%-88% placed on NRB with albuterol administered. Patient received CPR for 8 minutes in ED with no defibrillations, and was emergently intubated requiring mechanical ventilatory support now extubated. MD assessment includes: Severe ACUTE Hypoxic and Hypercapnic Respiratory Failure, HCAP, acute systolic cardiac failure, Myasthenia Gravis query crisis, AKI, and elevated troponin in setting of demand ischemia.   Clinical Impression  Of note, pt with CVC triple lumen in R femoral with NP contacted and giving ok for pt to participate with PT services without restriction.  Site monitored during the session with no complications noted.  Pt was pleasant and motivated to participate during the session and put forth good effort throughout. Pt required extensive +2 assist with most mobility tasks and presented with the need for occasional increased time and cuing to follow commands.  Pt is at very high fall risk and would not be safe to return to his prior living situation at this time. Pt will benefit from PT services in a SNF setting upon discharge to safely address deficits listed in patient problem list for decreased caregiver assistance and eventual return to PLOF.          Recommendations for follow up therapy are one component of a multi-disciplinary discharge planning process, led by the attending physician.  Recommendations may be updated based on patient status, additional functional criteria and insurance authorization.  Follow Up Recommendations Skilled nursing-short term rehab (<3 hours/day)    Assistance Recommended at Discharge Frequent or constant Supervision/Assistance  Patient can return  home with the following  Two people to help with walking and/or transfers;Two people to help with bathing/dressing/bathroom;Assistance with cooking/housework;Help with stairs or ramp for entrance;Assist for transportation    Equipment Recommendations Other (comment) (TBD at next venue of care)  Recommendations for Other Services       Functional Status Assessment Patient has had a recent decline in their functional status and demonstrates the ability to make significant improvements in function in a reasonable and predictable amount of time.     Precautions / Restrictions Precautions Precautions: Fall Restrictions Weight Bearing Restrictions: No Other Position/Activity Restrictions: CVC Triple Lumen Right Femoral, flexible type, no mobility restrictions per NP 05/08/22      Mobility  Bed Mobility Overal bed mobility: Needs Assistance Bed Mobility: Supine to Sit, Sit to Supine     Supine to sit: Min assist, +2 for physical assistance Sit to supine: Mod assist, +2 for physical assistance   General bed mobility comments: Physical assist for BLE and trunk control    Transfers Overall transfer level: Needs assistance Equipment used: Rolling walker (2 wheels) Transfers: Sit to/from Stand Sit to Stand: Max assist, +2 physical assistance           General transfer comment: Pt unable to come to full upright standing position with heavy trunk flexion and lean on the RW    Ambulation/Gait               General Gait Details: Unable to advance LE during attempt to step at [facility]  Stairs            Wheelchair Mobility    Modified Rankin (Stroke Patients Only)       Balance Overall  balance assessment: Needs assistance   Sitting balance-Leahy Scale: Good     Standing balance support: Bilateral upper extremity supported Standing balance-Leahy Scale: Poor                               Pertinent Vitals/Pain Pain Assessment Pain Assessment:  0-10 Pain Score: 10-Worst pain ever Pain Location: L hip Pain Descriptors / Indicators: Sore, Aching Pain Intervention(s): Repositioned, Premedicated before session, Monitored during session    Home Living Family/patient expects to be discharged to:: Private residence Living Arrangements: Spouse/significant other Available Help at Discharge: Family;Available 24 hours/day Type of Home: House Home Access: Stairs to enter Entrance Stairs-Rails: None Entrance Stairs-Number of Steps: 1   Home Layout: One level Home Equipment: Grab bars - tub/shower;Hand held shower head;Grab bars - toilet;Rolling Walker (2 wheels);Rollator (4 wheels);Shower seat - built in;Cane - quad      Prior Function Prior Level of Function : Independent/Modified Independent             Mobility Comments: Uses a rollator for household and community mobility. Denies any falls within past 6 months. ADLs Comments: Supervision for bathing, otherwise independent with ADLs. Wife cooks, hires [employer]   Dominant Hand: Right    Extremity/Trunk Assessment   Upper Extremity Assessment Upper Extremity Assessment: Generalized weakness    Lower Extremity Assessment Lower Extremity Assessment: Generalized weakness       Communication   Communication: No difficulties  Cognition Arousal/Alertness: Awake/alert Behavior During Therapy: WFL for tasks assessed/performed Overall Cognitive Status: No family/caregiver present to determine baseline cognitive functioning                                 General Comments: Min extra time and cuing needed to follow commands        General Comments      Exercises Total Joint Exercises Long Arc Quad: AROM, Strengthening, Both, 10 reps Knee Flexion: AROM, Strengthening, Both, 10 reps Other Exercises Other Exercises: Static sitting at EOB for core strengthening and improved activity tolerance Other Exercises: Static standing at EOB with  heavy +2 assist for improved activity tolerance   Assessment/Plan    PT Assessment Patient needs continued PT services  PT Problem List Decreased strength;Decreased activity tolerance;Decreased balance;Decreased mobility;Decreased knowledge of use of DME;Pain       PT Treatment Interventions DME instruction;Gait training;Stair training;Functional mobility training;Therapeutic activities;Therapeutic exercise;Balance training;Patient/family education    PT Goals (Current goals can be found in the Care Plan section)  Acute Rehab PT Goals Patient Stated Goal: To walk better with better endurance PT Goal Formulation: With patient Time For Goal Achievement: 05/21/22 Potential to Achieve Goals: Good    Frequency Min 2X/week     Co-evaluation PT/OT/SLP Co-Evaluation/Treatment: Yes Reason for Co-Treatment: Complexity of the patient's impairments (multi-system involvement);For patient/therapist safety;To address functional/ADL transfers PT goals addressed during session: Mobility/safety with mobility;Strengthening/ROM;Proper use of DME;Balance OT goals addressed during session: ADL's and self-care       AM-PAC PT "6 Clicks" Mobility  Outcome Measure Help needed turning from your back to your side while in a flat bed without using bedrails?: A Lot Help needed moving from lying on your back to sitting on the side of a flat bed without using bedrails?: Total Help needed moving to and from a bed to a chair (including a wheelchair)?: Total Help needed  standing up from a chair using your arms (e.g., wheelchair or bedside chair)?: Total Help needed to walk in hospital room?: Total Help needed climbing 3-5 steps with a railing? : Total 6 Click Score: 7    End of Session Equipment Utilized During Treatment: Gait belt;Oxygen Activity Tolerance: Patient tolerated treatment well Patient left: in bed;with call bell/phone within reach Nurse Communication: Mobility status PT Visit Diagnosis:  Muscle weakness (generalized) (M62.81);Difficulty in walking, not elsewhere classified (R26.2);Pain Pain - Right/Left: Left Pain - part of body: Hip    Time: 0911-0952 PT Time Calculation (min) (ACUTE ONLY): 41 min   Charges:   PT Evaluation $PT Eval Moderate Complexity: 1 Mod PT Treatments $Therapeutic Exercise: 8-22 mins        D. Royetta Asal PT, DPT 05/08/22, 11:34 AM

## 2022-05-08 NOTE — Consult Note (Signed)
PHARMACY CONSULT NOTE  Pharmacy Consult for Electrolyte Monitoring and Replacement   Recent Labs: Potassium (mmol/L)  Date Value  05/08/2022 4.2   Magnesium (mg/dL)  Date Value  05/08/2022 2.1   Calcium (mg/dL)  Date Value  05/08/2022 8.0 (L)   Albumin (g/dL)  Date Value  05/03/2022 2.9 (L)  04/03/2022 4.1   Phosphorus (mg/dL)  Date Value  05/08/2022 3.7   Sodium (mmol/L)  Date Value  05/08/2022 140  04/03/2022 149 (H)   Assessment: Patient is a 76 y/o M with medical history including Afib on Eliquis, COPD, combined systolic and diastolic CHF, B8ML, HLD, HTN, myasthenia gravis, ventricular tachycardia who presented to the ED 5/21 early AM with chest pain and respiratory distress. Patient experienced cardiac arrest with attainment of ROSC after 8 minutes of CPR. Patient is currently intubated, sedated, and on mechanical ventilation in the ICU. Pharmacy consulted to assist with electrolyte monitoring and replacement as indicated.  - possible myasthenia gravis exacerbation?  Extubated 5/24, re-intubated 5/27  Nutrition: Dys 3  Scr stable; 1.13>1.03> 0.90  Goal of Therapy:  Electrolytes within normal limits  Plan:   no pharmacological intervention recommended currently --Follow-up electrolytes with AM labs tomorrow --Avoid IV magnesium if possible given history of myasthenia gravis  Konni Kesinger A 05/08/2022 12:03 PM

## 2022-05-08 NOTE — Progress Notes (Addendum)
Speech Language Pathology Treatment: Dysphagia  Patient Details Name: Joseph Macdonald MRN: [id number] DOB: [birth year] Today's Date: 05/08/2022 Time: 4332-9518 SLP Time Calculation (min) (ACUTE ONLY): 40 min  Assessment / Plan / Recommendation Clinical Impression  Pt seen for ongoing assessment of swallowing and toleration of current dys. Level 3 diet(for ease of cut, moisten foods) for conservation of energy. He was alert, verbally responsive and engaged in conversation w/ SLP; talkative. He stated he felt his breathing was "better" today; he showed pictures on his phone of his dog and talked about his time in the service w/ this Clinician. Pt is on 2L [place] O2 support; afebrile. Native dentition adequate.    Pt explained general aspiration precautions and agreed verbally to the need for following them especially sitting upright for all oral intake -- supported behind the back for more upright sitting. He fed himself trials of purees and soft solids moistened by purees, w/ thin liquids via straw. No overt clinical s/s of aspiration were noted w/ any consistency; respiratory status remained calm and unlabored, vocal quality clear b/t trials. O2 sats 96% during trials. Oral phase appeared [facility] for bolus management, control, and timely A-P transfer for swallowing; mastication of increased textured trials was adequate in setting of respiratory status currently -- min extra Time needed for full bolus prep for swallow then oral clearing, but overall WFL. Oral clearing achieved w/ all consistencies. NSG denied any deficits in swallowing as well. Pt was given Pills Whole in puree (by nsg present) which were tolerated well; the puree providing cohesion for swallowing tablets. OF NOTE: Pt stated he used a Puree at home when swallowing pills. NSG aware.   Pt appears at reduced risk for aspriation in setting of respiratory status, recent/prolonged intubation, medical comorbidities, and deconditioned state w/ lengthy  hospitalization when following general aspiration precautions.     Recommend continue a mech soft diet for ease of Cut, soft foods w/ gravies added to moisten foods; Thin liquids (while admitted). Recommend general aspiration precautions; Rest Breaks when needed for conservation of energy during meals. Pills Whole in Puree; tray setup and positioning assistance for meals. REFLUX precautions. ST services will sign off at this time w/ MD to reconsult if needed while admitted. NSG updated. Precautions posted at bedside.      HPI HPI: Per H&P "76 yo M presenting to [facility] ED via EMS from home on 04/30/22 in respiratory distress. Per documentation patient was found by EMS hypoxic with SpO2 71%-88% placed on NRB with albuterol administered.  Family reports the patient was in his normal state of health but had NOT been taking his Lasix prescription. They believe he was taking the rest of his medication as prescribed but did not take his nighttime medications on Saturday.  Family denies fever/chills, vomiting, slurred speech, weakness. They did report he had some nausea, one episode of diarrhea and mild nasal congestion as well as a non productive cough. They report he has chronic shortness of breath which started to appear more severe on Saturday.  They confirmed that the patient does not wear oxygen at home. Per report he quit smoking in 2018 after a 50 pack year history, drinks ETOH occasionally and has no recreational drug use history.     ED course:  Upon arrival, per EDP report the patient was in severe respiratory distress and "purple". Before they were able to intervene, patient lost pulses. He was not on telemetry yet and it was unclear what was the initial rhythm. Patient  received CPR for 8 minutes no defibrillations, and was emergently intubated requiring mechanical ventilatory support. Upon intubation, EDP noted frothy respiratory secretions. ROSC obtained and patient extremely hypertensive with SBP in the  200's. Lasix given and propofol started. SBP subsequently dropped into the 60's, propofol stopped and levophed started. EKG showing some inferolateral STE, Dr. Alfred Levins spoke with STEMI cardiologist Dr. Fletcher Anon who reviewed EKG and determined the patient did not meet STEMI criteria."      SLP Plan  All goals met      Recommendations for follow up therapy are one component of a multi-disciplinary discharge planning process, led by the attending physician.  Recommendations may be updated based on patient status, additional functional criteria and insurance authorization.    Recommendations  Diet recommendations: Dysphagia 3 (mechanical soft);Thin liquid Liquids provided via: Cup;Straw Medication Administration: Whole meds with puree (does so at Home per his report) Supervision: Patient able to self feed Compensations: Minimize environmental distractions;Small sips/bites;Slow rate;Lingual sweep for clearance of pocketing;Follow solids with liquid Postural Changes and/or Swallow Maneuvers: Out of bed for meals;Seated upright 90 degrees;Upright 30-60 min after meal (Reflux precautions)                General recommendations:  (Dietician f/u) Oral Care Recommendations: Oral care BID;Oral care before and after PO;Patient independent with oral care (setup support) Follow Up Recommendations: No SLP follow up Assistance recommended at discharge: PRN SLP Visit Diagnosis: Dysphagia, unspecified (R13.10) Plan: All goals met              Orinda Kenner, MS, CCC-SLP Speech Language Pathologist Rehab Services; [place] [phone] (ascom) Rhett Mutschler  05/08/2022, 1:40 PM

## 2022-05-08 NOTE — Progress Notes (Signed)
Subjective: Patient states that he continues to feel some gradual improvement in his strength.   Objective: Current vital signs: BP 139/84 (BP Location: Left Arm)   Pulse 78   Temp 98.2 F (36.8 C) (Oral)   Resp 19   Wt 109 kg   SpO2 98%   BMI 37.64 kg/m  Vital signs in last 24 hours: Temp:  [97.5 F (36.4 C)-98.4 F (36.9 C)] 98.2 F (36.8 C) (05/29 1955) Pulse Rate:  [65-111] 78 (05/29 1955) Resp:  [14-32] 19 (05/29 1955) BP: (131-173)/(77-111) 139/84 (05/29 1955) SpO2:  [93 %-98 %] 98 % (05/29 1958)  Intake/Output from previous day: 05/28 0701 - 05/29 0700 In: 1050.8 [P.O.:840; I.V.:49; IV Piggyback:161.8] Out: 925 [Urine:925] Intake/Output this shift: No intake/output data recorded. Nutritional status:  Diet Order             DIET DYS 3 Room service appropriate? Yes with Assist; Fluid consistency: Thin  Diet effective now                  HEENT: [place]/AT Lungs: Respirations unlabored Ext: Chronic bruising and thin skin x 4. Mild pitting edema distal BLE>   Neurological exam: Ment: Intact to complex questions and commands. No aphasia noted.  CN: Fixates normally. Bilateral ptosis rapidly worsens with horizontal and upgaze. Horizontal EOM intact but with double vision; rapid fatiguability on upgaze. Speech dysarhric. Bulbar weakness is noted. Neck flexion 4-/5. Neck extension 5/5 Motor: 4/5 in upper and lower extremities bilateally Sensory: Intact to FT x 4 Reflexes: 1+ bilateral patellae   Lab Results: Results for orders placed or performed during the hospital encounter of 04/30/22 (from the past 48 hour(s))  Glucose, capillary     Status: Abnormal   Collection Time: 05/07/22 12:08 AM  Result Value Ref Range   Glucose-Capillary 123 (H) 70 - 99 mg/dL    Comment: Glucose reference range applies only to samples taken after fasting for at least 8 hours.  CBC     Status: Abnormal   Collection Time: 05/07/22  3:14 AM  Result Value Ref Range   WBC 10.7 (H) 4.0  - 10.5 K/uL   RBC 4.87 4.22 - 5.81 MIL/uL   Hemoglobin 14.5 13.0 - 17.0 g/dL   HCT 48.6 39.0 - 52.0 %   MCV 99.8 80.0 - 100.0 fL   MCH 29.8 26.0 - 34.0 pg   MCHC 29.8 (L) 30.0 - 36.0 g/dL   RDW 14.1 11.5 - 15.5 %   Platelets 128 (L) 150 - 400 K/uL   nRBC 0.0 0.0 - 0.2 %    Comment: Performed at [facility], [address], [place], [place] [address]  Basic metabolic panel     Status: Abnormal   Collection Time: 05/07/22  3:14 AM  Result Value Ref Range   Sodium 147 (H) 135 - 145 mmol/L   Potassium 4.5 3.5 - 5.1 mmol/L   Chloride 108 98 - 111 mmol/L   CO2 31 22 - 32 mmol/L   Glucose, Bld 149 (H) 70 - 99 mg/dL    Comment: Glucose reference range applies only to samples taken after fasting for at least 8 hours.   BUN 41 (H) 8 - 23 mg/dL   Creatinine, Ser 1.03 0.61 - 1.24 mg/dL   Calcium 8.0 (L) 8.9 - 10.3 mg/dL   GFR, Estimated >60 >60 mL/min    Comment: (NOTE) Calculated using the CKD-EPI Creatinine Equation (2021)    Anion gap 8 5 - 15  Comment: Performed at [facility], [address] [place], [place] [address]  Phosphorus     Status: None   Collection Time: 05/07/22  3:14 AM  Result Value Ref Range   Phosphorus 4.6 2.5 - 4.6 mg/dL    Comment: Performed at [facility], [address] [place], [place] [address]  Magnesium     Status: None   Collection Time: 05/07/22  3:14 AM  Result Value Ref Range   Magnesium 2.3 1.7 - 2.4 mg/dL    Comment: Performed at [facility], [address] [place], [place] [address]  Glucose, capillary     Status: Abnormal   Collection Time: 05/07/22  3:41 AM  Result Value Ref Range   Glucose-Capillary 131 (H) 70 - 99 mg/dL    Comment: Glucose reference range applies only to samples taken after fasting for at least 8 hours.  Glucose, capillary     Status: Abnormal   Collection Time: 05/07/22  7:42 AM  Result Value Ref Range   Glucose-Capillary 128 (H) 70 - 99 mg/dL    Comment: Glucose reference  range applies only to samples taken after fasting for at least 8 hours.  Glucose, capillary     Status: Abnormal   Collection Time: 05/07/22 11:31 AM  Result Value Ref Range   Glucose-Capillary 164 (H) 70 - 99 mg/dL    Comment: Glucose reference range applies only to samples taken after fasting for at least 8 hours.  Glucose, capillary     Status: Abnormal   Collection Time: 05/07/22  3:57 PM  Result Value Ref Range   Glucose-Capillary 200 (H) 70 - 99 mg/dL    Comment: Glucose reference range applies only to samples taken after fasting for at least 8 hours.  Glucose, capillary     Status: Abnormal   Collection Time: 05/07/22  7:22 PM  Result Value Ref Range   Glucose-Capillary 170 (H) 70 - 99 mg/dL    Comment: Glucose reference range applies only to samples taken after fasting for at least 8 hours.  Glucose, capillary     Status: Abnormal   Collection Time: 05/07/22 10:02 PM  Result Value Ref Range   Glucose-Capillary 127 (H) 70 - 99 mg/dL    Comment: Glucose reference range applies only to samples taken after fasting for at least 8 hours.  Glucose, capillary     Status: Abnormal   Collection Time: 05/07/22 11:20 PM  Result Value Ref Range   Glucose-Capillary 141 (H) 70 - 99 mg/dL    Comment: Glucose reference range applies only to samples taken after fasting for at least 8 hours.  Basic metabolic panel     Status: Abnormal   Collection Time: 05/08/22  4:00 AM  Result Value Ref Range   Sodium 140 135 - 145 mmol/L   Potassium 4.2 3.5 - 5.1 mmol/L   Chloride 103 98 - 111 mmol/L   CO2 29 22 - 32 mmol/L   Glucose, Bld 137 (H) 70 - 99 mg/dL    Comment: Glucose reference range applies only to samples taken after fasting for at least 8 hours.   BUN 32 (H) 8 - 23 mg/dL   Creatinine, Ser 0.90 0.61 - 1.24 mg/dL   Calcium 8.0 (L) 8.9 - 10.3 mg/dL   GFR, Estimated >60 >60 mL/min    Comment: (NOTE) Calculated using the CKD-EPI Creatinine Equation (2021)    Anion gap 8 5 - 15    Comment:  Performed at [facility],  [place], [place] [address]  Phosphorus     Status: None   Collection Time: 05/08/22  4:00 AM  Result Value Ref Range   Phosphorus 3.7 2.5 - 4.6 mg/dL    Comment: Performed at [facility], [address] [place], [place] [address]  Magnesium     Status: None   Collection Time: 05/08/22  4:00 AM  Result Value Ref Range   Magnesium 2.1 1.7 - 2.4 mg/dL    Comment: Performed at [facility], [address] [place], [place] [address]  CBC     Status: Abnormal   Collection Time: 05/08/22  4:00 AM  Result Value Ref Range   WBC 11.6 (H) 4.0 - 10.5 K/uL   RBC 4.98 4.22 - 5.81 MIL/uL   Hemoglobin 14.7 13.0 - 17.0 g/dL   HCT 49.0 39.0 - 52.0 %   MCV 98.4 80.0 - 100.0 fL   MCH 29.5 26.0 - 34.0 pg   MCHC 30.0 30.0 - 36.0 g/dL   RDW 13.6 11.5 - 15.5 %   Platelets 135 (L) 150 - 400 K/uL   nRBC 0.0 0.0 - 0.2 %    Comment: Performed at [facility], [address] [place],  [address]  Glucose, capillary     Status: Abnormal   Collection Time: 05/08/22  7:21 AM  Result Value Ref Range   Glucose-Capillary 124 (H) 70 - 99 mg/dL    Comment: Glucose reference range applies only to samples taken after fasting for at least 8 hours.  Glucose, capillary     Status: Abnormal   Collection Time: 05/08/22 11:16 AM  Result Value Ref Range   Glucose-Capillary 221 (H) 70 - 99 mg/dL    Comment: Glucose reference range applies only to samples taken after fasting for at least 8 hours.  Glucose, capillary     Status: Abnormal   Collection Time: 05/08/22  4:57 PM  Result Value Ref Range   Glucose-Capillary 227 (H) 70 - 99 mg/dL    Comment: Glucose reference range applies only to samples taken after fasting for at least 8 hours.  Glucose, capillary     Status: Abnormal   Collection Time: 05/08/22  8:15 PM  Result Value Ref Range   Glucose-Capillary 164 (H) 70 - 99 mg/dL    Comment: Glucose reference range applies only  to samples taken after fasting for at least 8 hours.   Comment 1 Notify RN     Recent Results (from the past 240 hour(s))  Respiratory (~20 pathogens) panel by PCR     Status: None   Collection Time: 04/30/22  5:37 AM   Specimen: Tracheal Aspirate; Respiratory  Result Value Ref Range Status   Adenovirus NOT DETECTED NOT DETECTED Final   Coronavirus 229E NOT DETECTED NOT DETECTED Final    Comment: (NOTE) The Coronavirus on the Respiratory Panel, DOES NOT test for the novel  Coronavirus (2019 nCoV)    Coronavirus HKU1 NOT DETECTED NOT DETECTED Final   Coronavirus NL63 NOT DETECTED NOT DETECTED Final   Coronavirus OC43 NOT DETECTED NOT DETECTED Final   Metapneumovirus NOT DETECTED NOT DETECTED Final   Rhinovirus / Enterovirus NOT DETECTED NOT DETECTED Final   Influenza A NOT DETECTED NOT DETECTED Final   Influenza B NOT DETECTED NOT DETECTED Final   Parainfluenza Virus 1 NOT DETECTED NOT DETECTED Final   Parainfluenza Virus 2 NOT DETECTED NOT DETECTED Final   Parainfluenza Virus 3 NOT DETECTED NOT DETECTED Final  Parainfluenza Virus 4 NOT DETECTED NOT DETECTED Final   Respiratory Syncytial Virus NOT DETECTED NOT DETECTED Final   Bordetella pertussis NOT DETECTED NOT DETECTED Final   Bordetella Parapertussis NOT DETECTED NOT DETECTED Final   Chlamydophila pneumoniae NOT DETECTED NOT DETECTED Final   Mycoplasma pneumoniae NOT DETECTED NOT DETECTED Final    Comment: Performed at [facility], [place] [address], [place], [place] [address]  MRSA Next Gen by PCR, Nasal     Status: None   Collection Time: 04/30/22  5:46 AM   Specimen: Nasal Mucosa; Nasal Swab  Result Value Ref Range Status   MRSA by PCR Next Gen NOT DETECTED NOT DETECTED Final    Comment: (NOTE) The GeneXpert MRSA Assay (FDA approved for NASAL specimens only), is one component of a comprehensive MRSA colonization surveillance program. It is not intended to diagnose MRSA infection nor to guide or monitor treatment  for MRSA infections. Test performance is not FDA approved in patients less than 48 years old. Performed at [facility], [address] [place], [place] [address]   Resp Panel by RT-PCR (Flu A&B, Covid)     Status: None   Collection Time: 04/30/22  8:04 AM  Result Value Ref Range Status   SARS Coronavirus 2 by RT PCR NEGATIVE NEGATIVE Final    Comment: (NOTE) SARS-CoV-2 target nucleic acids are NOT DETECTED.  The SARS-CoV-2 RNA is generally detectable in upper respiratory specimens during the acute phase of infection. The lowest concentration of SARS-CoV-2 viral copies this assay can detect is 138 copies/mL. A negative result does not preclude SARS-Cov-2 infection and should not be used as the sole basis for treatment or other patient management decisions. A negative result may occur with  improper specimen collection/handling, submission of specimen other than nasopharyngeal swab, presence of viral mutation(s) within the areas targeted by this assay, and inadequate number of viral copies(<138 copies/mL). A negative result must be combined with clinical observations, patient history, and epidemiological information. The expected result is Negative.  Fact Sheet for Patients:  EntrepreneurPulse.com.au  Fact Sheet for Healthcare Providers:  IncredibleEmployment.be  This test is no t yet approved or cleared by the [place] FDA and  has been authorized for detection and/or diagnosis of SARS-CoV-2 by FDA under an Emergency Use Authorization (EUA). This EUA will remain  in effect (meaning this test can be used) for the duration of the COVID-19 declaration under Section 564(b)(1) of the Act, 21 U.S.C.section 360bbb-3(b)(1), unless the authorization is terminated  or revoked sooner.       Influenza A by PCR NEGATIVE NEGATIVE Final   Influenza B by PCR NEGATIVE NEGATIVE Final    Comment: (NOTE) The Xpert Xpress SARS-CoV-2/FLU/RSV  plus assay is intended as an aid in the diagnosis of influenza from Nasopharyngeal swab specimens and should not be used as a sole basis for treatment. Nasal washings and aspirates are unacceptable for Xpert Xpress SARS-CoV-2/FLU/RSV testing.  Fact Sheet for Patients: EntrepreneurPulse.com.au  Fact Sheet for Healthcare Providers: IncredibleEmployment.be  This test is not yet approved or cleared by the [place] FDA and has been authorized for detection and/or diagnosis of SARS-CoV-2 by FDA under an Emergency Use Authorization (EUA). This EUA will remain in effect (meaning this test can be used) for the duration of the COVID-19 declaration under Section 564(b)(1) of the Act, 21 U.S.C. section 360bbb-3(b)(1), unless the authorization is terminated or revoked.  Performed at [facility], [address], [place], [place] [address]   Culture,  Respiratory w Gram Stain     Status: None   Collection Time: 04/30/22  8:04 AM   Specimen: Tracheal Aspirate; Respiratory  Result Value Ref Range Status   Specimen Description   Final    TRACHEAL ASPIRATE Performed at [facility], [address] [place], [place] [address]    Special Requests   Final    Normal Performed at [facility], [place], [place] [address]    Gram Stain   Final    RARE WBC PRESENT, PREDOMINANTLY PMN RARE GRAM POSITIVE COCCI IN PAIRS    Culture   Final    FEW Normal respiratory flora-no Staph aureus or Pseudomonas seen Performed at [facility] [address], [place], [place] [address]    Report Status 05/03/2022 FINAL  Final  Culture, BAL-quantitative w Gram Stain     Status: Abnormal   Collection Time: 05/03/22  5:47 PM   Specimen: Bronchoalveolar Lavage; Respiratory  Result Value Ref Range Status   Specimen Description   Final    BRONCHIAL ALVEOLAR LAVAGE Performed at [facility], [address]  [place], [place] [address]    Special Requests   Final    Immunocompromised Performed at [facility], [address] [place], [place] [address]    Gram Stain   Final    ABUNDANT WBC PRESENT, PREDOMINANTLY PMN RARE BUDDING YEAST SEEN RARE GRAM NEGATIVE RODS    Culture (A)  Final    20,000 COLONIES/mL ESCHERICHIA COLI 2,000 COLONIES/mL KLEBSIELLA PNEUMONIAE NO STAPHYLOCOCCUS AUREUS ISOLATED No Pseudomonas species isolated Performed at [facility], [place] [address] [place],  [address]    Report Status 05/06/2022 FINAL  Final   Organism ID, Bacteria ESCHERICHIA COLI (A)  Final   Organism ID, Bacteria KLEBSIELLA PNEUMONIAE (A)  Final      Susceptibility   Escherichia coli - MIC*    AMPICILLIN >=32 RESISTANT Resistant     CEFAZOLIN 16 SENSITIVE Sensitive     CEFEPIME <=0.12 SENSITIVE Sensitive     CEFTAZIDIME <=1 SENSITIVE Sensitive     CEFTRIAXONE <=0.25 SENSITIVE Sensitive     CIPROFLOXACIN <=0.25 SENSITIVE Sensitive     GENTAMICIN <=1 SENSITIVE Sensitive     IMIPENEM <=0.25 SENSITIVE Sensitive     TRIMETH/SULFA <=20 SENSITIVE Sensitive     AMPICILLIN/SULBACTAM >=32 RESISTANT Resistant     PIP/TAZO <=4 SENSITIVE Sensitive     * 20,000 COLONIES/mL ESCHERICHIA COLI   Klebsiella pneumoniae - MIC*    AMPICILLIN RESISTANT Resistant     CEFAZOLIN <=4 SENSITIVE Sensitive     CEFEPIME <=0.12 SENSITIVE Sensitive     CEFTAZIDIME <=1 SENSITIVE Sensitive     CEFTRIAXONE <=0.25 SENSITIVE Sensitive     CIPROFLOXACIN <=0.25 SENSITIVE Sensitive     GENTAMICIN <=1 SENSITIVE Sensitive     IMIPENEM <=0.25 SENSITIVE Sensitive     TRIMETH/SULFA <=20 SENSITIVE Sensitive     AMPICILLIN/SULBACTAM 4 SENSITIVE Sensitive     PIP/TAZO <=4 SENSITIVE Sensitive     * 2,000 COLONIES/mL KLEBSIELLA PNEUMONIAE    Lipid Panel No results for input(s): CHOL, TRIG, HDL, CHOLHDL, VLDL, LDLCALC in the last 72 hours.  Studies/Results: No results found.  Medications: Scheduled:  apixaban   5 mg Oral BID   budesonide (PULMICORT) nebulizer solution  0.25 mg Nebulization BID   chlorhexidine  15 mL Mouth Rinse BID   Chlorhexidine Gluconate Cloth  6 each Topical Daily   feeding supplement  237 mL Oral BID BM  insulin aspart  0-20 Units Subcutaneous TID WC & HS   ipratropium-albuterol  3 mL Nebulization BID   mouth rinse  15 mL Mouth Rinse q12n4p   methylPREDNISolone (SOLU-MEDROL) injection  20 mg Intravenous BID   multivitamin with minerals  1 tablet Oral Daily   OLANZapine zydis  5 mg Oral BID   pantoprazole (PROTONIX) IV  40 mg Intravenous Daily   pyridostigmine  30 mg Oral TID   sodium chloride flush  3 mL Intravenous Q12H   Continuous:  sodium chloride 10 mL/hr at 05/08/22 2156   piperacillin-tazobactam (ZOSYN)  IV 3.375 g (05/08/22 2213)    Assessment: 76 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 He shows multiple findings on examination c/w at least mild myasthenia exacerbation including mild fatigable upgaze and ptosis, weak neck flexion and bulbar weakness as well as diplopia. Given that he was just extubated on Saturday and is subjectively improving, would continue to observe for one more day. If he is not significantly improved from a strength and respiratory standpoint on Tuesday, it would be reasonable to treat with IVIG for MG flare.  - Dr. Quinn Axe also discussed potential IVIG treatment with Dr. Oval Linsey of Cardiology on Sunday. Cards is amenable to this plan and will work with [place] to more aggressively diurese him during IVIG tx to offset the volume load, if IVIG is determined to be indicated.  - He was moved from the ICU to the floor today.    Recommendations: - Continue to check NIFs  - Will continue to reassess and make further recommendations regarding  possible IVIG if he does not further improve - Frequent neuro checks - Avoid medications known to worsen myasthenia gravis symptoms.     LOS: 8 days   _0  signed: Dr. Kerney Elbe 05/08/2022  10:31 PM

## 2022-05-08 NOTE — Progress Notes (Signed)
Progress Note  Patient Name: Joseph Macdonald Date of Encounter: 05/08/2022  [facility] Cardiologist: [name], MD   Subjective   Status post one-way extubation on 5/27, now on supplemental oxygen at 2 L via nasal cannula. No chest pain. Renal function improving.   Inpatient Medications    Scheduled Meds:  apixaban  5 mg Oral BID   budesonide (PULMICORT) nebulizer solution  0.25 mg Nebulization BID   chlorhexidine  15 mL Mouth Rinse BID   Chlorhexidine Gluconate Cloth  6 each Topical Daily   insulin aspart  0-20 Units Subcutaneous TID WC & HS   ipratropium-albuterol  3 mL Nebulization BID   mouth rinse  15 mL Mouth Rinse q12n4p   methylPREDNISolone (SOLU-MEDROL) injection  20 mg Intravenous BID   OLANZapine zydis  5 mg Oral BID   pantoprazole (PROTONIX) IV  40 mg Intravenous Daily   pyridostigmine  30 mg Oral TID   sodium chloride flush  10-40 mL Intracatheter Q12H   Continuous Infusions:  piperacillin-tazobactam (ZOSYN)  IV 12.5 mL/hr at 05/08/22 0700   PRN Meds: atropine, diazepam, hydrALAZINE, HYDROcodone-acetaminophen, HYDROmorphone (DILAUDID) injection, polyethylene glycol, sodium chloride flush   Vital Signs    Vitals:   05/08/22 0700 05/08/22 0730 05/08/22 0800 05/08/22 0900  BP: (!) 143/81 (!) 159/88 (!) 171/98 (!) 169/84  Pulse: 67 81 89 78  Resp: 14 19 (!) 32 18  Temp:  98.2 F (36.8 C)    TempSrc:  Oral    SpO2: 98% 96% 96% 94%  Weight:        Intake/Output Summary (Last 24 hours) at 05/08/2022 1026 Last data filed at 05/08/2022 0700 Gross per 24 hour  Intake 634.21 ml  Output 925 ml  Net -290.79 ml       05/07/2022    3:45 AM 05/06/2022    5:00 AM  Last 3 Weights  Weight (lbs) 240 lb 4.8 oz 239 lb 10.2 oz  Weight (kg) 109 kg 108.7 kg      Telemetry    SR with PVCs and artifact.- Personally Reviewed  ECG    No new tracings - Personally Reviewed  Physical Exam   VS:  BP (!) 169/84   Pulse 78   Temp 98.2 F (36.8 C)  (Oral)   Resp 18   Wt 109 kg   SpO2 94%   BMI 37.64 kg/m  , BMI Body mass index is 37.64 kg/m. GENERAL: Well-appearing.  No acute distress. HEENT: Pupils equal round and reactive, fundi not visualized, oral mucosa unremarkable NECK:  No jugular venous distention, waveform within normal limits, carotid upstroke brisk and symmetric, no bruits, no thyromegaly LUNGS: Bilateral rhonchi anteriorly. HEART:  RRR.  PMI not displaced or sustained,S1 and S2 within normal limits, no S3, no S4, no clicks, no rubs, no murmurs ABD:  Flat, positive bowel sounds normal in frequency in pitch, no bruits, no rebound, no guarding, no midline pulsatile mass, no hepatomegaly, no splenomegaly EXT:  2 plus pulses throughout, 1+ bilateral lower extremity edema, no cyanosis no clubbing SKIN:  No rashes no nodules NEURO:  Cranial nerves II through XII grossly intact, motor grossly intact throughout PSYCH:  Cognitively intact, oriented to person place and time   Labs    High Sensitivity Troponin:   Recent Labs  Lab 04/30/22 0332 04/30/22 0546 04/30/22 1734 04/30/22 1955  TROPONINIHS 373* 1,137* 2,844* 2,210*      Chemistry Recent Labs  Lab 05/02/22 0502 05/02/22 1519 05/03/22 0334 05/04/22 0449 05/06/22  9323 05/07/22 0314 05/08/22 0400  NA 144  --  148*   < > 147* 147* 140  K 3.6  --  3.6   < > 4.0 4.5 4.2  CL 108  --  110   < > 109 108 103  CO2 27  --  32   < > '30 31 29  '$ GLUCOSE 143*  --  128*   < > 159* 149* 137*  BUN 47*  --  39*   < > 55* 41* 32*  CREATININE 1.30*  --  1.29*   < > 1.13 1.03 0.90  CALCIUM 7.4*  --  7.6*   < > 8.1* 8.0* 8.0*  MG 2.6*   < >  --    < > 2.6* 2.3 2.1  PROT 5.1*  --  5.7*  --   --   --   --   ALBUMIN 2.6*  --  2.9*  --   --   --   --   AST 434*  --  336*  --   --   --   --   ALT 972*  --  1,002*  --   --   --   --   ALKPHOS 39  --  34*  --   --   --   --   BILITOT 1.3*  --  1.3*  --   --   --   --   GFRNONAA 57*  --  58*   < > >60 >60 >60  ANIONGAP 9  --  6    < > '8 8 8   '$ < > = values in this interval not displayed.     Lipids No results for input(s): CHOL, TRIG, HDL, LABVLDL, LDLCALC, CHOLHDL in the last 168 hours.  Hematology Recent Labs  Lab 05/06/22 0544 05/07/22 0314 05/08/22 0400  WBC 14.0* 10.7* 11.6*  RBC 4.84 4.87 4.98  HGB 14.2 14.5 14.7  HCT 47.0 48.6 49.0  MCV 97.1 99.8 98.4  MCH 29.3 29.8 29.5  MCHC 30.2 29.8* 30.0  RDW 14.0 14.1 13.6  PLT 105* 128* 135*    Thyroid  No results for input(s): TSH, FREET4 in the last 168 hours.   BNP No results for input(s): BNP, PROBNP in the last 168 hours.   DDimer No results for input(s): DDIMER in the last 168 hours.   Radiology    No results found.  Cardiac Studies   Echo 04/30/2022:  1. Left ventricular ejection fraction, by estimation, is 35 to 40%. The  left ventricle has moderately decreased function. The left ventricle  demonstrates global hypokinesis. There is mild left ventricular  hypertrophy. Left ventricular diastolic  parameters are consistent with Grade I diastolic dysfunction (impaired  relaxation).   2. Right ventricular systolic function is moderately reduced. The right  ventricular size is mildly enlarged.   3. The mitral valve is normal in structure. No evidence of mitral valve  regurgitation.   4. The aortic valve is grossly normal. Aortic valve regurgitation is not  visualized.   Echo 04/20/22:  1. Challenging study, PVCs noted.   2. Left ventricular ejection fraction, by estimation, is 45 to 50%. The  left ventricle has mildly decreased function. The left ventricle has no  regional wall motion abnormalities. There is mild left ventricular  hypertrophy. Left ventricular diastolic  parameters are consistent with Grade I diastolic dysfunction (impaired  relaxation).   3. Right ventricular systolic function is mildly reduced. The right  ventricular size is normal. Tricuspid regurgitation signal is inadequate  for assessing PA pressure.   4. The  mitral valve is normal in structure. Mild to moderate mitral valve  regurgitation. No evidence of mitral stenosis.   5. The aortic valve is normal in structure. Aortic valve regurgitation is  mild. No aortic stenosis is present.   6. There is borderline dilatation of the aortic root, measuring 37 mm.  There is mild dilatation of the ascending aorta, measuring 42 mm.   7. The inferior vena cava is normal in size with greater than 50%  respiratory variability, suggesting right atrial pressure of 3 mmHg.   Patient Profile     76 y.o. male with chronic systolic and diastolic heart failure, PAF, COPD, myasthenia gravis admitted with PEA arrest in the setting of acute on chronic systolic diastolic heart failure and hypoxic respiratory failure in the setting of not taking his diuretics.  Hospitalization also complicated by acute on chronic renal failure.  Assessment & Plan    # Acute on chronic systolic and diastolic heart failure:  LVEF this admission 35 to 40%, down from 45 to 50% previously.  Holding further diuresis in the setting of hyponatremia.  Start Entresto and BiDil once able to tolerate oral medications and his blood pressure allows.  No beta-blockers given that he has myasthenia gravis.  BP is stable off IV nitroglycerin.  It is possible he will need IVIG to treat acute myasthenia if his weakness does not improve with time.  If necessary we will need to increase diuresis to manage the volume overload.    #PEA arrest: Occurred in the setting of decompensated heart failure and respiratory failure.  He has known atrial fibrillation and PVCs.  Flecainide currently being held.  Patient was intubated and extubated 5/24.  He had to be reintubated.  Family elected for one-way extubation 5/27 and he has done well.  He will need a repeat ischemic evaluation once more medically stable.  #Hypertensive emergency: Blood pressures after extubation were in the 893Y systolic, now improved to the 101B  systolic.  This has been discontinued and he is doing much better.  Currently not on any antihypertensives; would start low-dose Entresto once medically stable.  Could also add BiDil if needed. Will discuss with MD.  Avoid beta-blocker in the setting of myasthenia gravis.  # PAF:  Currently in sinus rhythm with some sinus tachycardia.  He did have some short paroxysms of atrial fibrillation over the weekend.  No beta-blockers in the setting of myasthenia gravis, may need to consider for rate control if not contraindicated.  No calcium channel blockers in the setting of systolic dysfunction.  If necessary would use amiodarone or digoxin for rate control.  Continue Eliquis.      For questions or updates, please contact [place] Please consult [email or web address] for contact info under        Signed, Christell Faith, PA-C  05/08/2022, 10:26 AM

## 2022-05-08 NOTE — Evaluation (Signed)
Occupational Therapy Evaluation Patient Details Name: Joseph Macdonald MRN: [id number] DOB: 10-18-1946 Today's Date: 05/08/2022   History of Present Illness Pt is a 76 yo M presenting [facility] ED via EMS from home on 04/30/22 in respiratory distress. Per documentation patient was found by EMS hypoxic with SpO2 71%-88% placed on NRB with albuterol administered. Patient received CPR for 8 minutes in ED with no defibrillations, and was emergently intubated requiring mechanical ventilatory support now extubated. MD assessment includes: Severe ACUTE Hypoxic and Hypercapnic Respiratory Failure, HCAP, acute systolic cardiac failure, Myasthenia Gravis query crisis, AKI, and elevated troponin in setting of demand ischemia.   Clinical Impression   Pt seen for OT evaluation this date. Prior to admission, pt was independent for most ADLs (received supervision from wife for bathing) and was MOD-I with rollator for functional mobility of household and community distances. Pt currently requires MIN A for donning socks (at bed-level with bed in high-fowler's position), MOD A+2 for bed mobility, and MAX A+2 for sit<>stand transfers with RW d/t decreased strength, balance, and activity tolerance. While standing EOB, pt noted to have BM, requiring MAX A+2 for peri-care in standing position. Pt unable to achieve full upright standing position nor take lateral steps toward HOB d/t decreased strength and activity tolerance. Pt left sitting upright in bed, in no acute distress, with all needs within reach. While on 2L of O2, SpO2>92% throughout session. Pt would benefit from additional skilled OT services to maximize return to PLOF and minimize risk of future falls, injury, caregiver burden, and readmission. Upon discharge, recommend SNF.      Recommendations for follow up therapy are one component of a multi-disciplinary discharge planning process, led by the attending physician.  Recommendations may be updated based on patient  status, additional functional criteria and insurance authorization.   Follow Up Recommendations  Skilled nursing-short term rehab (<3 hours/day)    Assistance Recommended at Discharge Intermittent Supervision/Assistance  Patient can return home with the following Two people to help with walking and/or transfers;Two people to help with bathing/dressing/bathroom    Functional Status Assessment  Patient has had a recent decline in their functional status and demonstrates the ability to make significant improvements in function in a reasonable and predictable amount of time.  Equipment Recommendations  Other (comment) (defer to next venue of care)       Precautions / Restrictions Precautions Precautions: Fall Restrictions Weight Bearing Restrictions: No Other Position/Activity Restrictions: CVC Triple Lumen Right Femoral, flexible type, no mobility restrictions per NP 05/08/22      Mobility Bed Mobility Overal bed mobility: Needs Assistance Bed Mobility: Supine to Sit, Sit to Supine     Supine to sit: Min assist, +2 for physical assistance Sit to supine: Mod assist, +2 for physical assistance   General bed mobility comments: Physical assist for BLE and trunk control    Transfers Overall transfer level: Needs assistance Equipment used: Rolling walker (2 wheels) Transfers: Sit to/from Stand Sit to Stand: Max assist, +2 physical assistance           General transfer comment: Pt unable to come to full upright standing position with heavy trunk flexion and lean on the RW      Balance Overall balance assessment: Needs assistance Sitting-balance support: Bilateral upper extremity supported, Feet supported Sitting balance-Leahy Scale: Good Sitting balance - Comments: Good static sitting balance at EOB   Standing balance support: Bilateral upper extremity supported, During functional activity Standing balance-Leahy Scale: Zero Standing balance comment: Requires MAX  A to  maintain balance. Unable to achieve full upright standing position (heavy trunk flexion and lean on the RW)                           ADL either performed or assessed with clinical judgement   ADL Overall ADL's : Needs assistance/impaired                     Lower Body Dressing: Minimal assistance;Bed level Lower Body Dressing Details (indicate cue type and reason): Requires MIN A to don socks over toes with pt in high fowler's position in bed     Toileting- Clothing Manipulation and Hygiene: Maximal assistance;+2 for safety/equipment;Sit to/from stand               Vision Baseline Vision/History: 1 Wears glasses Ability to See in Adequate Light: 0 Adequate Patient Visual Report: No change from baseline              Pertinent Vitals/Pain Pain Assessment Pain Assessment: 0-10 Pain Score: 10-Worst pain ever Pain Location: L hip Pain Descriptors / Indicators: Sore, Aching Pain Intervention(s): Monitored during session, Premedicated before session, Repositioned     Hand Dominance Right   Extremity/Trunk Assessment Upper Extremity Assessment Upper Extremity Assessment: Generalized weakness   Lower Extremity Assessment Lower Extremity Assessment: Generalized weakness       Communication Communication Communication: No difficulties   Cognition Arousal/Alertness: Awake/alert Behavior During Therapy: WFL for tasks assessed/performed Overall Cognitive Status: No family/caregiver present to determine baseline cognitive functioning                                 General Comments: A&Ox3 (re-oriented to situation). Pleasant and agreeable throughout. Requires increased time for processing     General Comments  Following sit>stand transfer, HR 125bpm (returning to resting rate of 90-100 at end of session). SpO2>92% throughout session while on 2L of O2 via [place] expects to be discharged to:: Private  residence Living Arrangements: Spouse/significant other Available Help at Discharge: Family;Available 24 hours/day Type of Home: House Home Access: Stairs to enter [employer] of Steps: 1 Entrance Stairs-Rails: None Home Layout: One level     Bathroom Shower/Tub: Occupational psychologist: Handicapped height     Home Equipment: Grab bars - tub/shower;Hand held shower head;Grab bars - toilet;Rolling Walker (2 wheels);Rollator (4 wheels);Shower seat - built in;Cane - quad          Prior Functioning/Environment Prior Level of Function : Independent/Modified Independent             Mobility Comments: Uses a rollator for household and community mobility. Denies any falls within past 6 months. ADLs Comments: Supervision for bathing, otherwise independent with ADLs. Wife cooks, hires Ambulance person Problem List: Decreased strength;Decreased activity tolerance;Impaired balance (sitting and/or standing);Decreased cognition;Decreased knowledge of precautions;Decreased knowledge of use of DME or AE;Pain      OT Treatment/Interventions: Self-care/ADL training;Energy conservation;DME and/or AE instruction;Therapeutic activities;Patient/family education;Balance training    OT Goals(Current goals can be found in the care plan section) Acute Rehab OT Goals Patient Stated Goal: to regain strength OT Goal Formulation: With patient Time For Goal Achievement: 05/22/22 Potential to Achieve Goals: Good ADL Goals Pt Will Perform Grooming: with set-up;sitting Pt Will Perform Lower  Body Dressing: with min assist;with adaptive equipment;sitting/lateral leans Pt Will Transfer to Toilet: with mod assist;stand pivot transfer;bedside commode  OT Frequency: Min 2X/week    Co-evaluation PT/OT/SLP Co-Evaluation/Treatment: Yes Reason for Co-Treatment: Complexity of the patient's impairments (multi-system involvement);For patient/therapist safety;To address functional/ADL  transfers;Other (comment) (2/2 pt's potentially limited activity tolerance) PT goals addressed during session: Mobility/safety with mobility;Balance;Strengthening/ROM;Proper use of DME OT goals addressed during session: ADL's and self-care      AM-PAC OT "6 Clicks" Daily Activity     Outcome Measure Help from another person eating meals?: None Help from another person taking care of personal grooming?: A Little Help from another person toileting, which includes using toliet, bedpan, or urinal?: A Lot Help from another person bathing (including washing, rinsing, drying)?: A Lot Help from another person to put on and taking off regular upper body clothing?: A Little Help from another person to put on and taking off regular lower body clothing?: A Lot 6 Click Score: 16   End of Session Equipment Utilized During Treatment: Gait belt;Rolling walker (2 wheels);Oxygen Nurse Communication: Mobility status  Activity Tolerance: Patient tolerated treatment well Patient left: in bed;with call bell/phone within reach;with bed alarm set  OT Visit Diagnosis: Unsteadiness on feet (R26.81);Muscle weakness (generalized) (M62.81);Pain Pain - Right/Left: Left Pain - part of body: Hip                Time: 7673-4193 OT Time Calculation (min): 44 min Charges:  OT General Charges $OT Visit: 1 Visit OT Evaluation $OT Eval Moderate Complexity: 1 Mod OT Treatments $Self Care/Home Management : 8-22 mins  Fredirick Maudlin, OTR/L [place]

## 2022-05-08 NOTE — Progress Notes (Signed)
NAME:  Joseph Macdonald, MRN:  [id number], DOB:  [birth year], LOS: 8 ADMISSION DATE:  04/30/2022, CONSULTATION DATE:  04/30/22 REFERRING MD:  Dr. Alfred Levins, CHIEF COMPLAINT:  Respiratory distress   History of Present Illness:  76 yo M presenting to [facility] ED via EMS from home on 04/30/22 in respiratory distress. Per documentation patient was found by EMS hypoxic with SpO2 71%-88% placed on NRB with albuterol administered. Family reports the patient was in his normal state of health but had NOT been taking his Lasix prescription. They believe he was taking the rest of his medication as prescribed but did not take his nighttime medications on Saturday. Family denies fever/chills, vomiting, slurred speech, weakness. They did report he had some nausea, one episode of diarrhea and mild nasal congestion as well as a non productive cough. They report he has chronic shortness of breath which started to appear more severe on Saturday.  They confirmed that the patient does not wear oxygen at home. Per report he quit smoking in 2018 after a 50 pack year history, drinks ETOH occasionally and has no recreational drug use history.   ED course: Upon arrival, per EDP report the patient was in severe respiratory distress and "purple". Before they were able to intervene, patient lost pulses. He was not on telemetry yet and it was unclear what was the initial rhythm. Patient received CPR for 8 minutes no defibrillations, and was emergently intubated requiring mechanical ventilatory support. Upon intubation, EDP noted frothy respiratory secretions. ROSC obtained and patient extremely hypertensive with SBP in the 200's. Lasix given and propofol started. SBP subsequently dropped into the 60's, propofol stopped and levophed started. EKG showing some inferolateral STE, Dr. Alfred Levins spoke with STEMI cardiologist Dr. Fletcher Anon who reviewed EKG and determined the patient did not meet STEMI criteria. Medications given: 80 mg Lasix IV,  propofol started then stopped, levophed drip started  Significant labs: (Labs/ Imaging personally reviewed) I, Domingo Pulse [place], AGACNP-BC, personally viewed and interpreted this ECG. EKG Interpretation: Date: 04/30/22, EKG Time: 03:38, Rate: 63, Rhythm: NSR, QRS Axis:  LAD, Intervals: 1st degree HB, ST/T Wave abnormalities: Inferolateral STE, non specific T wave inversions , Narrative Interpretation: NSR with 1st dgree HB & LAD with inferolateral STE that does not meet STEMI criteria per cardiology review Chemistry: pending Hematology: mild leukocytosis WBC: 12.4, Hgb: 16.6,  Troponin: 373, BNP: 971.6, lactic/PCT: pending,  COVID-19 & Influenza A/B: pending ABG: pending CXR 04/30/22: Mild interstitial pulmonary edema  PCCM consulted for admission due to acute hypoxic respiratory failure and subsequent cardiac arrest with circulatory shock requiring mechanical ventilation and vasopressor support.  05/08/22- patient is mildy improved. He is having loose stools today which is new. R femoral central line +.   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 5/22 remains intubated, on vent.  Code Status changed to DNR.  Repeat CTH negative, EEG without evidence of seizures 5/23: Patient withdraws from pain in all extremities with intermittent agitation, plan for MRI Brain & CT  Abdomen 5/24: WUA with plans for extubation 5/24 failed extubation, LEFT lung white out on CXR 5/24 re-intubated, severe hypoxia, s/p bronch removed bloody mucus plugs 5/25 remains on vent 5/26 stable on vent overnight 5/27 extubated  Interim History / Subjective:  Severe hypoxia Failed extubation 5/24 Remains critically ill Prognosis is very poor       Objective   Blood pressure (!) 169/84, pulse 78, temperature 98.2 F (36.8 C), temperature source Oral, resp. rate 18, weight 109 kg, SpO2 94 %.        Intake/Output Summary (Last 24 hours) at 05/08/2022 1053 Last data filed at 05/08/2022 1035 Gross per 24 hour  Intake 754.21 ml  Output 925 ml  Net -170.79 ml    Filed Weights   05/06/22 0500 05/07/22 0345  Weight: 108.7 kg 109 kg    REVIEW OF SYSTEMS  PATIENT IS UNABLE TO PROVIDE COMPLETE REVIEW OF SYSTEMS DUE TO SEVERE CRITICAL ILLNESS AND TOXIC METABOLIC ENCEPHALOPATHY    PHYSICAL EXAMINATION:  GENERAL:critically ill appearing, +resp distress EYES: Pupils equal, round, reactive to light.  No scleral icterus.  MOUTH: Moist mucosal membrane.  NECK: Supple.  PULMONARY: +rhonchi, +wheezing CARDIOVASCULAR: S1 and S2.  No murmurs  GASTROINTESTINAL: Soft, nontender, -distended. Positive bowel sounds.  MUSCULOSKELETAL: No swelling, clubbing, or edema.  NEUROLOGIC: AAO3 SKIN:intact,warm,dry     Assessment & Plan:  76 yo white male with  PEA Cardiac arrest: suspect pulmonary etiology due to pneumonia with Elevated Troponin in setting of Demand Ischemia /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 Paroxsymal Atrial Fibrillation failed trial of extubation due to lung collapse and inability to protect airway with severe resp insufficiency with underlying MG   Severe ACUTE Hypoxic and Hypercapnic Respiratory Failure Aspiration? HCAP -Extubated   -maintain rate and afterload reduction after extubation to prevent diastolic pulm  edema -remains on Zosyn -lower dose steroid -Still weak but doing well perhaps MG -Neuro considering IVIG  ACUTE SYSTOLIC CARDIAC FAILURE- EF 20% 8 minutes downtime, CPR initiated immediately, received no defibrillations (deemed not a candidate for hypothermia protocol) -Allow to re-equilibrate off lasix again today, -6L+ since admit -follow up cardiac enzymes as indicated -now on DOAC -Cardiology following, appreciate input -May resume diuresis as BP and renal function permits -Echocardiogram 5/21: LVEF 35-40%, Grade I DD, RV systolic function moderately reduced   Myasthenia Gravis query crisis -Neurology following to assist with myasthenia gravis management and medication regimen -perhaps IVIG per Dr. Quinn Axe  ACUTE KIDNEY INJURY/Renal Failure Lab Results  Component Value Date   CREATININE 0.90 05/08/2022   BUN 32 (H) 05/08/2022   NA 140 05/08/2022   K 4.2 05/08/2022   CL 103 05/08/2022   CO2 29 05/08/2022    -AKI resolved -Na higher -continue Foley Catheter-assess need -Avoid nephrotoxic agents -Follow urine output, BMP -Ensure adequate renal perfusion, optimize oxygenation -Renal dose medications   Intake/Output Summary (Last 24 hours) at 05/08/2022 1053 Last data filed at 05/08/2022 1035 Gross per 24 hour  Intake 754.21 ml  Output 925 ml  Net -170.79 ml   Net IO Since Admission: -6,900.47 mL [05/08/22 1053]      Latest Ref Rng & Units 05/08/2022    4:00 AM 05/07/2022    3:14 AM 05/06/2022    5:44 AM  BMP  Glucose 70 - 99 mg/dL 137   149   159    BUN 8 - 23 mg/dL 32   41   55    Creatinine 0.61 - 1.24 mg/dL 0.90  1.03   1.13    Sodium 135 - 145 mmol/L 140   147   147    Potassium 3.5 - 5.1 mmol/L 4.2   4.5   4.0    Chloride 98 - 111 mmol/L 103   108   109    CO2 22 - 32 mmol/L '29   31   30    '$ Calcium 8.9 - 10.3 mg/dL 8.0   8.0   8.1      NEUROLOGY -Anxiolysis  -Maintain prn Dilaudid and prn Valium  -Maintain ODT Zyprexa for now  ENDO - ICU  hypoglycemic\Hyperglycemia protocol -check FSBS per protocol   GI GI PROPHYLAXIS as indicated  NUTRITIONAL STATUS DIET-->TF's held OGT out Constipation protocol as indicated   ELECTROLYTES -follow labs as needed -replace as needed -pharmacy consultation and following   Transaminitis in the setting of cardiac arrest   Lab Results  Component Value Date   AST 336 (H) 05/03/2022   AST 434 (H) 05/02/2022   AST 1,750 (H) 04/30/2022   Lab Results  Component Value Date   ALT 1,002 (H) 05/03/2022   ALT 972 (H) 05/02/2022   ALT 1,349 (H) 04/30/2022     CT Abd Pelvis 05/02/22 revealed no focal liver abnormality is seen. No gallstones, gallbladder wall thickening, or biliary dilatation, gallbladder wall thickening, or biliary dilatation. - Trend hepatic function - Avoid hepatotoxic agents   Best Practice (right click and "Reselect all SmartList Selections" daily)  Diet/type: NPO w/ meds via tube DVT prophylaxis: Heparin gtt GI prophylaxis: PPI Lines: Central line, Arterial Line, and yes and it is still needed Foley:  Yes, and it is still needed Code Status: Limited Code (Mechanical Intubation Only)   Patient's wife at bedside and updated regarding plan of care  multiple times Labs   CBC: Recent Labs  Lab 05/04/22 0449 05/05/22 0446 05/06/22 0544 05/07/22 0314 05/08/22 0400  WBC 9.2 9.4 14.0* 10.7* 11.6*  HGB 14.1 13.7 14.2 14.5 14.7  HCT 47.2 45.5 47.0 48.6 49.0  MCV 99.0 98.3 97.1 99.8 98.4  PLT 82* 84* 105* 128* 135*     Basic Metabolic Panel: Recent Labs  Lab 05/04/22 0449 05/05/22 0446 05/06/22 0544 05/07/22 0314 05/08/22 0400  NA 148* 146* 147* 147* 140  K 3.8 3.9 4.0 4.5 4.2  CL 111 109 109 108 103  CO2 '30 28 30 31 29  '$ GLUCOSE 137* 212* 159* 149* 137*  BUN 43* 50* 55* 41* 32*  CREATININE 1.28* 1.10 1.13 1.03 0.90  CALCIUM 7.3* 7.9* 8.1* 8.0* 8.0*  MG 2.5* 2.5* 2.6* 2.3 2.1  PHOS 3.6 3.7 4.8* 4.6 3.7    GFR: Estimated Creatinine Clearance:  83.6 mL/min (by C-G formula based on SCr of 0.9 mg/dL). Recent Labs  Lab 05/02/22 0502 05/03/22 0334 05/05/22 0446 05/06/22 0544 05/07/22 0314 05/08/22 0400  PROCALCITON 0.90  --   --   --   --   --   WBC 13.5*   < > 9.4 14.0* 10.7* 11.6*   < > = values in this interval not displayed.     Liver Function Tests: Recent Labs  Lab 05/02/22 0502 05/03/22 0334  AST 434* 336*  ALT 972* 1,002*  ALKPHOS 39 34*  BILITOT 1.3* 1.3*  PROT 5.1* 5.7*  ALBUMIN 2.6* 2.9*    No results for input(s): LIPASE, AMYLASE in the last 168 hours. No results for input(s): AMMONIA in the last 168 hours.  ABG    Component Value Date/Time  PHART 7.42 05/03/2022 1606   PCO2ART 47 05/03/2022 1606   PO2ART 69 (L) 05/03/2022 1606   HCO3 30.5 (H) 05/03/2022 1606   ACIDBASEDEF 1.9 04/30/2022 0804   O2SAT 95 05/03/2022 1606     Coagulation Profile: No results for input(s): INR, PROTIME in the last 168 hours.   Cardiac Enzymes: No results for input(s): CKTOTAL, CKMB, CKMBINDEX, TROPONINI in the last 168 hours.  HbA1C: Hemoglobin A1C  Date/Time Value Ref Range Status  12/21/2015 12:00 AM 6.3  Final   Hgb A1c MFr Bld  Date/Time Value Ref Range Status  04/30/2022 05:46 AM 6.9 (H) 4.8 - 5.6 % Final    Comment:    (NOTE) Pre diabetes:          5.7%-6.4%  Diabetes:              >6.4%  Glycemic control for   <7.0% adults with diabetes     CBG: Recent Labs  Lab 05/07/22 1557 05/07/22 1922 05/07/22 2202 05/07/22 2320 05/08/22 0721  GLUCAP 200* 170* 127* 141* 124*      DVT/GI PRX  assessed I Assessed the need for Labs I Assessed the need for Foley I Assessed the need for Central Venous Line Family Discussion when available I Assessed the need for Mobilization I made an Assessment of medications to be adjusted accordingly Safety Risk assessment completed  CASE DISCUSSED IN MULTIDISCIPLINARY ROUNDS WITH ICU TEAM  Long conversation with family at bedside

## 2022-05-08 NOTE — Progress Notes (Signed)
Daily Progress Note   Patient Name: Joseph Macdonald       Date: 05/08/2022 DOB: Jan 06, 1946  Age: 76 y.o. MRN#: [id number] Attending Physician: Ottie Glazier, MD Primary Care Physician: [name], MD Admit Date: 04/30/2022 Length of Stay: 8 days  Reason for Consultation/Follow-up: Establishing goals of care  HPI/Patient Profile:  76 y.o. male  with past medical history of A-fib on Eliquis, COPD, combined systolic and diastolic CHF, P2AE, HLD, HTN, myasthenia gravis, V. tach.  He presented to the [facility] emergency department in severe respiratory distress and "purple" and before placed on telemetry lost pulses.  He received CPR for 8 minutes with no defibrillations and was emergently intubated for mechanical ventilatory support.  He was admitted on 04/30/2022 with PEA arrest (suspected pulmonary etiology), circulatory shock, acute on chronic combined systolic and diastolic CHF exacerbation, acute hypoxic respiratory failure, AKI in the setting of cardiac arrest, and others.    He was briefly extubated after MRI without evidence of anoxic brain injury but required emergent reintubation shortly thereafter due to left lung white-out from mucus plug s/p bronch removal. Overall prognosis poor.   PMT was consulted for Elbert conversations.  Subjective:   Subjective: Chart Reviewed. Updates received. Patient Assessed. Created space and opportunity for patient  and family to explore thoughts and feelings regarding current medical situation.  Today's Discussion: Today I met with the patient at the bedside, no family was present. He is now extubated and doing significantly better. Planned transfer to the floor today. He states his breathing is doing good today.  He expresses a desire to continue offered treatments and live, specifically because he is expecting grandchildren in the coming months.  We discussed taking it at a time and seeing how he does.  I expressed that we will celebrate the  improvements he has made.  He notes his biggest frustration at this point is his weakness which is likely multifactorial given myasthenia gravis and prolonged acute illness.  We discussed that we will continue to follow along during his stay and support him and his family as best we can.  We agreed to take it a day at a time for clinical progress.  I provided emotional and general response to therapeutic listening, empathy, sharing of stories, therapeutic touch, and other techniques.  I answered all questions and addressed all concerns to the best of my ability.  Review of Systems  Constitutional:  Positive for fatigue.  Respiratory:  Positive for shortness of breath (Specifically with activity).   Gastrointestinal:  Positive for diarrhea. Negative for nausea and vomiting.  Neurological:  Positive for weakness.   Objective:   Vital Signs:  BP (!) 166/99   Pulse 96   Temp 98.2 F (36.8 C) (Oral)   Resp (!) 22   Wt 109 kg   SpO2 94%   BMI 37.64 kg/m   Physical Exam: Physical Exam Vitals and nursing note reviewed.  Constitutional:      General: He is not in acute distress.    Appearance: He is ill-appearing.     Interventions: He is sedated and intubated.  HENT:     Head: Normocephalic and atraumatic.  Cardiovascular:     Rate and Rhythm: Normal rate.  Pulmonary:     Effort: Pulmonary effort is normal. No respiratory distress. He is intubated.  Abdominal:     General: Abdomen is protuberant. Bowel sounds are normal.     Comments: Abdomen is firm but not tense  Skin:  General: Skin is warm and dry.  Neurological:     General: No focal deficit present.  Psychiatric:        Mood and Affect: Mood normal.        Behavior: Behavior normal.     Comments: Appears anxious    Palliative Assessment/Data: 50%   Assessment & Plan:   Impression: Present on Admission: **None**  76 year old male with multiple chronic comorbidities presents with acute respiratory distress,  chest pain, and subsequent cardiac arrest.  ROSC returned after 8 minutes of CPR.  He was previously extubated and required reintubation soon thereafter.  Noted mucous plug cleared on bronchoscopy after reintubation.  He was again extubated and seems to be doing better, is now on 2 L nasal cannula.  Plans to transfer to the floor today. His echocardiogram also shows significant worsening over the past month, query cardiac etiology such as cardiac ischemia. They share that overall he has not had a great quality of life as of late and he would not want a worsening quality of life.  MRI postcardiac arrest with no evidence of anoxic brain injury.  He seems to be doing better since the weekend but overall prognosis remains guarded to poor.   SUMMARY OF RECOMMENDATIONS   Remain DNR Continue to treat the treatable Continued spiritual care Continued emotional support of the patient and family Will need to reassess goals with patient input PMT will continue to follow  Symptom Management:  Per primary team PMT is available to assist as needed, especially if the patient transitions to comfort care at some point  Code Status: Limited code  Prognosis: Unable to determine  Discharge Planning: To Be Determined  Discussed with: Medical team, nursing team, patient's family  Thank you for allowing [place] to participate in the care of DIERRE CREVIER PMT will continue to support holistically.  Billing based on MDM: High  Problems Addressed: One acute or chronic illness or injury that poses a threat to life or bodily function  Amount and/or Complexity of Data: Category 3:Discussion of management or test interpretation with external physician/other qualified health care professional/appropriate source (not separately reported)  Risks: Decision not to resuscitate or to de-escalate care because of poor prognosis   [name], NP Palliative Medicine Team  Team Phone # [phone] (Nights/Weekends)   08/09/2021, 8:17 AM

## 2022-05-09 ENCOUNTER — Inpatient Hospital Stay: Payer: PPO

## 2022-05-09 DIAGNOSIS — I5043 Acute on chronic combined systolic (congestive) and diastolic (congestive) heart failure: Secondary | ICD-10-CM | POA: Diagnosis present

## 2022-05-09 DIAGNOSIS — Z7189 Other specified counseling: Secondary | ICD-10-CM | POA: Diagnosis not present

## 2022-05-09 DIAGNOSIS — J9601 Acute respiratory failure with hypoxia: Secondary | ICD-10-CM | POA: Diagnosis not present

## 2022-05-09 DIAGNOSIS — I5023 Acute on chronic systolic (congestive) heart failure: Secondary | ICD-10-CM | POA: Diagnosis not present

## 2022-05-09 DIAGNOSIS — Z515 Encounter for palliative care: Secondary | ICD-10-CM | POA: Diagnosis not present

## 2022-05-09 DIAGNOSIS — L8990 Pressure ulcer of unspecified site, unspecified stage: Secondary | ICD-10-CM | POA: Insufficient documentation

## 2022-05-09 DIAGNOSIS — J810 Acute pulmonary edema: Secondary | ICD-10-CM | POA: Diagnosis not present

## 2022-05-09 DIAGNOSIS — I468 Cardiac arrest due to other underlying condition: Secondary | ICD-10-CM | POA: Diagnosis not present

## 2022-05-09 DIAGNOSIS — Z66 Do not resuscitate: Secondary | ICD-10-CM | POA: Diagnosis not present

## 2022-05-09 LAB — CBC
HCT: 51.6 % (ref 39.0–52.0)
Hemoglobin: 16.1 g/dL (ref 13.0–17.0)
MCH: 29.7 pg (ref 26.0–34.0)
MCHC: 31.2 g/dL (ref 30.0–36.0)
MCV: 95.2 fL (ref 80.0–100.0)
Platelets: 150 10*3/uL (ref 150–400)
RBC: 5.42 MIL/uL (ref 4.22–5.81)
RDW: 13.4 % (ref 11.5–15.5)
WBC: 12.1 10*3/uL — ABNORMAL HIGH (ref 4.0–10.5)
nRBC: 0 % (ref 0.0–0.2)

## 2022-05-09 LAB — BLOOD GAS, ARTERIAL
Acid-Base Excess: 6.7 mmol/L — ABNORMAL HIGH (ref 0.0–2.0)
Bicarbonate: 32.5 mmol/L — ABNORMAL HIGH (ref 20.0–28.0)
O2 Content: 3 L/min
O2 Saturation: 97 %
Patient temperature: 37
pCO2 arterial: 49 mmHg — ABNORMAL HIGH (ref 32–48)
pH, Arterial: 7.43 (ref 7.35–7.45)
pO2, Arterial: 79 mmHg — ABNORMAL LOW (ref 83–108)

## 2022-05-09 LAB — BASIC METABOLIC PANEL
Anion gap: 8 (ref 5–15)
BUN: 23 mg/dL (ref 8–23)
CO2: 31 mmol/L (ref 22–32)
Calcium: 8.3 mg/dL — ABNORMAL LOW (ref 8.9–10.3)
Chloride: 103 mmol/L (ref 98–111)
Creatinine, Ser: 0.7 mg/dL (ref 0.61–1.24)
GFR, Estimated: >60 mL/min (ref >60)
Glucose, Bld: 157 mg/dL — ABNORMAL HIGH (ref 70–99)
Potassium: 3.9 mmol/L (ref 3.5–5.1)
Sodium: 142 mmol/L (ref 135–145)

## 2022-05-09 LAB — PROTIME-INR
INR: 1.3 — ABNORMAL HIGH (ref 0.8–1.2)
Prothrombin Time: 16.1 seconds — ABNORMAL HIGH (ref 11.4–15.2)

## 2022-05-09 LAB — GLUCOSE, CAPILLARY
Glucose-Capillary: 128 mg/dL — ABNORMAL HIGH (ref 70–99)
Glucose-Capillary: 161 mg/dL — ABNORMAL HIGH (ref 70–99)
Glucose-Capillary: 252 mg/dL — ABNORMAL HIGH (ref 70–99)
Glucose-Capillary: 119 mg/dL — ABNORMAL HIGH (ref 70–99)

## 2022-05-09 LAB — APTT
aPTT: 28 seconds (ref 24–36)
aPTT: 36 seconds (ref 24–36)

## 2022-05-09 LAB — HEPARIN LEVEL (UNFRACTIONATED): Heparin Unfractionated: >1.1 IU/mL — ABNORMAL HIGH (ref 0.30–0.70)

## 2022-05-09 MED ORDER — FUROSEMIDE 10 MG/ML IJ SOLN
40.0000 mg | Freq: Once | INTRAMUSCULAR | Status: AC
  Administered 2022-05-09: 40 mg via INTRAVENOUS
  Filled 2022-05-09: qty 4

## 2022-05-09 MED ORDER — HEPARIN BOLUS VIA INFUSION
2600.0000 [IU] | Freq: Once | INTRAVENOUS | Status: AC
  Administered 2022-05-09: 2600 [IU] via INTRAVENOUS
  Filled 2022-05-09: qty 2600

## 2022-05-09 MED ORDER — LOSARTAN POTASSIUM 25 MG PO TABS
12.5000 mg | ORAL_TABLET | Freq: Every day | ORAL | Status: DC
  Administered 2022-05-09 – 2022-05-12 (×4): 12.5 mg via ORAL
  Filled 2022-05-09 (×4): qty 1

## 2022-05-09 MED ORDER — HEPARIN (PORCINE) 25000 UT/250ML-% IV SOLN
1450.0000 [IU]/h | INTRAVENOUS | Status: DC
  Administered 2022-05-09: 1200 [IU]/h via INTRAVENOUS
  Administered 2022-05-10: 1700 [IU]/h via INTRAVENOUS
  Administered 2022-05-10: 1500 [IU]/h via INTRAVENOUS
  Administered 2022-05-11 – 2022-05-14 (×4): 1450 [IU]/h via INTRAVENOUS
  Filled 2022-05-09 (×7): qty 250

## 2022-05-09 MED ORDER — PANTOPRAZOLE SODIUM 40 MG PO TBEC
40.0000 mg | DELAYED_RELEASE_TABLET | Freq: Every day | ORAL | Status: DC
  Administered 2022-05-09 – 2022-05-18 (×10): 40 mg via ORAL
  Filled 2022-05-09 (×10): qty 1

## 2022-05-09 NOTE — TOC Progression Note (Signed)
Transition of Care [facility]) - Progression Note    Patient Details  Name: Joseph Macdonald MRN: [id number] Date of Birth: 06/24/1946  [facility] CM/SW Contact  Laurena Slimmer, RN Phone Number: 05/09/2022, 3:33 PM  Clinical Narrative:    Per PT/OT recommendation patient is appropriate for SNF level of care. PASSAR obtained. FL2 completed. Spoke with wife regarding facility of choice. Wife prefers [facility]. Bed search initiated.   Expected Discharge Plan:  (TBD) Barriers to Discharge: Continued Medical Work up  Expected Discharge Plan and Services Expected Discharge Plan:  (TBD)       Living arrangements for the past 2 months: Single Family Home                                       Social Determinants of Health (SDOH) Interventions    Readmission Risk Interventions     View : No data to display.

## 2022-05-09 NOTE — Progress Notes (Signed)
NAME:  Joseph Macdonald, MRN:  [id number], DOB:  1946/04/14, LOS: 9 ADMISSION DATE:  04/30/2022, CONSULTATION DATE:  04/30/22 REFERRING MD:  Dr. Alfred Levins, CHIEF COMPLAINT:  Respiratory distress   History of Present Illness:  76 yo M presenting to [facility] ED via EMS from home on 04/30/22 in respiratory distress. Per documentation patient was found by EMS hypoxic with SpO2 71%-88% placed on NRB with albuterol administered. Family reports the patient was in his normal state of health but had NOT been taking his Lasix prescription. They believe he was taking the rest of his medication as prescribed but did not take his nighttime medications on Saturday. Family denies fever/chills, vomiting, slurred speech, weakness. They did report he had some nausea, one episode of diarrhea and mild nasal congestion as well as a non productive cough. They report he has chronic shortness of breath which started to appear more severe on Saturday.  They confirmed that the patient does not wear oxygen at home. Per report he quit smoking in 2018 after a 50 pack year history, drinks ETOH occasionally and has no recreational drug use history.   05/09/22- was asked to see patient due to clinical worsening.  He is on 4L/min [place].  He is mildly confused and very sleepy with encephalopathy but seems to be in no distress. He states diarreah has resolved. His BAL cultures grew out ecoli and Klebsiella pneumoniae species. CXR today with persistent pneumonia worse on left.   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 5/22 remains intubated, on vent.  Code Status changed to DNR.  Repeat CTH negative, EEG without evidence of seizures 5/23: Patient withdraws from pain in all extremities with intermittent agitation, plan for MRI Brain & CT Abdomen 5/24: WUA with plans for extubation 5/24 failed extubation, LEFT lung white out on CXR 5/24 re-intubated, severe hypoxia, s/p bronch removed bloody mucus plugs 5/25 remains on vent 5/26 stable on vent overnight 5/27 extubated  Interim History / Subjective:  Severe hypoxia Failed extubation 5/24 Remains critically ill Prognosis is very poor       Objective   Blood pressure (!) 151/92, pulse 95, temperature 97.7 F (36.5 C), temperature source Oral, resp. rate (!) 22, weight 99.4 kg, SpO2 96 %.        Intake/Output Summary (Last 24 hours) at 05/09/2022 1532 Last data filed at 05/09/2022 1500 Gross per 24 hour  Intake 815.59 ml  Output 2750 ml  Net -1934.41 ml    Filed Weights   05/07/22 0345 05/09/22 0100 05/09/22 1884  Weight: 109 kg 101.3 kg 99.4 kg    REVIEW OF SYSTEMS  PATIENT IS UNABLE TO PROVIDE COMPLETE REVIEW OF SYSTEMS DUE TO SEVERE CRITICAL ILLNESS AND TOXIC METABOLIC ENCEPHALOPATHY    PHYSICAL EXAMINATION:  GENERAL mildly confused age appropriate in nAD EYES: Pupils equal, round, reactive to light.  No scleral icterus.  MOUTH: Moist mucosal membrane.  NECK: Supple.  PULMONARY: +rhonchi,  CARDIOVASCULAR: S1 and S2.  No murmurs  GASTROINTESTINAL:  Soft, nontender, -distended. Positive bowel sounds.  MUSCULOSKELETAL: No swelling, clubbing, or edema.  NEUROLOGIC: AAO3 SKIN:intact,warm,dry     Assessment & Plan:  76 yo white male with  PEA Cardiac arrest: suspect pulmonary etiology due to pneumonia with ECOLI and Klebsiella per  bronchoalveolar lavage cultures   -on Zosyn    - will dc pulmicort and solumedrol       Severe ACUTE Hypoxic and Hypercapnic Respiratory Failure Aspiration? HCAP -Extubated   -maintain rate and afterload reduction after extubation to prevent diastolic pulm edema -remains on Zosyn -dc steroid -Still weak but doing well perhaps MG -Neuro considering IVIG  ACUTE SYSTOLIC CARDIAC FAILURE- EF 20% 8 minutes downtime, CPR initiated immediately, received no defibrillations (deemed not a candidate for hypothermia protocol) -Allow to re-equilibrate off lasix again today, -6L+ since admit -follow up cardiac enzymes as indicated -now on DOAC -Cardiology following, appreciate input -May resume diuresis as BP and renal function permits -Echocardiogram 5/21: LVEF 35-40%, Grade I DD, RV systolic function moderately reduced   Myasthenia Gravis query crisis -Neurology following to assist with myasthenia gravis management and medication regimen -perhaps IVIG per Dr. Quinn Axe  ACUTE KIDNEY INJURY/Renal Failure Lab Results  Component Value Date   CREATININE 0.70 05/09/2022   BUN 23 05/09/2022   NA 142 05/09/2022   K 3.9 05/09/2022   CL 103 05/09/2022   CO2 31 05/09/2022    -AKI resolved -Na higher -continue Foley Catheter-assess need -Avoid nephrotoxic agents -Follow urine output, BMP -Ensure adequate renal perfusion, optimize oxygenation -Renal dose medications   Intake/Output Summary (Last 24 hours) at 05/09/2022 1532 Last data filed at 05/09/2022 1500 Gross per 24 hour  Intake 815.59 ml  Output 2750 ml  Net -1934.41 ml   Net IO Since Admission: -9,085.88 mL [05/09/22 1532]      Latest Ref Rng & Units 05/09/2022    5:29 AM 05/08/2022    4:00 AM 05/07/2022    3:14 AM  BMP  Glucose 70 - 99 mg/dL 157   137   149    BUN 8 - 23 mg/dL 23   32   41    Creatinine 0.61 - 1.24 mg/dL 0.70   0.90   1.03    Sodium 135 - 145 mmol/L 142   140   147    Potassium 3.5 - 5.1 mmol/L 3.9   4.2    4.5    Chloride 98 - 111 mmol/L 103   103   108    CO2 22 - 32 mmol/L _0 Calcium 8.9 - 10.3 mg/dL 8.3   8.0   8.0      NEUROLOGY -Anxiolysis  -Maintain prn Dilaudid and prn Valium  -Maintain ODT Zyprexa for now  ENDO - ICU hypoglycemic\Hyperglycemia protocol -check FSBS per protocol   GI GI PROPHYLAXIS as indicated  NUTRITIONAL STATUS DIET-->TF's held OGT out Constipation protocol as indicated   ELECTROLYTES -follow labs as needed -replace as needed -pharmacy consultation and following   Transaminitis in the setting of cardiac arrest   Lab Results  Component Value Date   AST 336 (H) 05/03/2022   AST 434 (H) 05/02/2022   AST 1,750 (H) 04/30/2022   Lab Results  Component Value Date   ALT 1,002 (H) 05/03/2022   ALT 972 (H) 05/02/2022   ALT 1,349 (H) 04/30/2022     CT Abd Pelvis 05/02/22 revealed no focal liver abnormality is seen. No gallstones, gallbladder wall thickening, or biliary  dilatation, gallbladder wall thickening, or biliary dilatation. - Trend hepatic function - Avoid hepatotoxic agents   Best Practice (right click and "Reselect all SmartList Selections" daily)  Diet/type: NPO w/ meds via tube DVT prophylaxis: Heparin gtt GI prophylaxis: PPI Lines: Central line, Arterial Line, and yes and it is still needed Foley:  Yes, and it is still needed Code Status: Limited Code (Mechanical Intubation Only)   Patient's wife at bedside and updated regarding plan of care  multiple times Labs   CBC: Recent Labs  Lab 05/05/22 0446 05/06/22 0544 05/07/22 0314 05/08/22 0400 05/09/22 0529  WBC 9.4 14.0* 10.7* 11.6* 12.1*  HGB 13.7 14.2 14.5 14.7 16.1  HCT 45.5 47.0 48.6 49.0 51.6  MCV 98.3 97.1 99.8 98.4 95.2  PLT 84* 105* 128* 135* 150     Basic Metabolic Panel: Recent Labs  Lab 05/04/22 0449 05/05/22 0446 05/06/22 0544 05/07/22 0314 05/08/22 0400 05/09/22 0529  NA 148* 146* 147* 147* 140 142  K 3.8 3.9 4.0 4.5 4.2 3.9  CL  111 109 109 108 103 103  CO2 _0 GLUCOSE 137* 212* 159* 149* 137* 157*  BUN 43* 50* 55* 41* 32* 23  CREATININE 1.28* 1.10 1.13 1.03 0.90 0.70  CALCIUM 7.3* 7.9* 8.1* 8.0* 8.0* 8.3*  MG 2.5* 2.5* 2.6* 2.3 2.1  --   PHOS 3.6 3.7 4.8* 4.6 3.7  --     GFR: Estimated Creatinine Clearance: 89.6 mL/min (by C-G formula based on SCr of 0.7 mg/dL). Recent Labs  Lab 05/06/22 0544 05/07/22 0314 05/08/22 0400 05/09/22 0529  WBC 14.0* 10.7* 11.6* 12.1*     Liver Function Tests: Recent Labs  Lab 05/03/22 0334  AST 336*  ALT 1,002*  ALKPHOS 34*  BILITOT 1.3*  PROT 5.7*  ALBUMIN 2.9*    No results for input(s): LIPASE, AMYLASE in the last 168 hours. No results for input(s): AMMONIA in the last 168 hours.  ABG    Component Value Date/Time   PHART 7.42 05/03/2022 1606   PCO2ART 47 05/03/2022 1606   PO2ART 69 (L) 05/03/2022 1606   HCO3 30.5 (H) 05/03/2022 1606   ACIDBASEDEF 1.9 04/30/2022 0804   O2SAT 95 05/03/2022 1606     Coagulation Profile: Recent Labs  Lab 05/09/22 0921  INR 1.3*     Cardiac Enzymes: No results for input(s): CKTOTAL, CKMB, CKMBINDEX, TROPONINI in the last 168 hours.  HbA1C: Hemoglobin A1C  Date/Time Value Ref Range Status  12/21/2015 12:00 AM 6.3  Final   Hgb A1c MFr Bld  Date/Time Value Ref Range Status  04/30/2022 05:46 AM 6.9 (H) 4.8 - 5.6 % Final    Comment:    (NOTE) Pre diabetes:          5.7%-6.4%  Diabetes:              >6.4%  Glycemic control for   <7.0% adults with diabetes     CBG: Recent Labs  Lab 05/08/22 1116 05/08/22 1657 05/08/22 2015 05/09/22 0915 05/09/22 1205  GLUCAP 221* 227* 164* 128* 161*      DVT/GI PRX  assessed I Assessed the need for Labs I Assessed the need for Foley I Assessed the need for Central Venous Line Family Discussion when available I Assessed the need for Mobilization I made an Assessment of medications to be adjusted accordingly Safety Risk assessment  completed    Ottie Glazier, M.D.  Pulmonary & Critical Care Medicine

## 2022-05-09 NOTE — Progress Notes (Signed)
Sent Dr. Kurtis Bushman secure chat and let MD know that patient complaining of slight shortness of breath and dilaudid given PRN for tachypnea for RR 22. Family also concerned about patient being anxious and restless.

## 2022-05-09 NOTE — Progress Notes (Signed)
Progress Note  Patient Name: Joseph Macdonald Date of Encounter: 05/09/2022  [facility] Cardiologist: [name], MD   Subjective   Status post one-way extubation on 5/27.  Transferred out of the ICU to telemetry on 5/29.  Continues to feel quite weak.  No chest pain or significant dyspnea.  Renal function has normalized.  Inpatient Medications    Scheduled Meds:  budesonide (PULMICORT) nebulizer solution  0.25 mg Nebulization BID   chlorhexidine  15 mL Mouth Rinse BID   Chlorhexidine Gluconate Cloth  6 each Topical Daily   feeding supplement  237 mL Oral BID BM   insulin aspart  0-20 Units Subcutaneous TID WC & HS   ipratropium-albuterol  3 mL Nebulization BID   losartan  12.5 mg Oral Daily   mouth rinse  15 mL Mouth Rinse q12n4p   methylPREDNISolone (SOLU-MEDROL) injection  20 mg Intravenous BID   multivitamin with minerals  1 tablet Oral Daily   OLANZapine zydis  5 mg Oral BID   pantoprazole (PROTONIX) IV  40 mg Intravenous Daily   pyridostigmine  30 mg Oral TID   sodium chloride flush  3 mL Intravenous Q12H   Continuous Infusions:  sodium chloride 10 mL/hr at 05/09/22 0631   heparin     piperacillin-tazobactam (ZOSYN)  IV 3.375 g (05/09/22 1610)   PRN Meds: sodium chloride, atropine, diazepam, hydrALAZINE, HYDROcodone-acetaminophen, HYDROmorphone (DILAUDID) injection, polyethylene glycol, sodium chloride flush   Vital Signs    Vitals:   05/09/22 0500 05/09/22 0632 05/09/22 0700 05/09/22 0745  BP:    (!) 150/93  Pulse:    91  Resp: '16  15 18  '$ Temp:    99.2 F (37.3 C)  TempSrc:    Oral  SpO2:    97%  Weight:  99.4 kg      Intake/Output Summary (Last 24 hours) at 05/09/2022 0832 Last data filed at 05/09/2022 0745 Gross per 24 hour  Intake 808.84 ml  Output 2901 ml  Net -2092.16 ml       05/09/2022    6:32 AM 05/09/2022    1:00 AM 05/07/2022    3:45 AM  Last 3 Weights  Weight (lbs) 219 lb 2.2 oz 223 lb 5.2 oz 240 lb 4.8 oz  Weight (kg) 99.4 kg  101.3 kg 109 kg      Telemetry    SR with PVCs and artifact.- Personally Reviewed  ECG    No new tracings - Personally Reviewed  Physical Exam   VS:  BP (!) 150/93 (BP Location: Left Arm)   Pulse 91   Temp 99.2 F (37.3 C) (Oral)   Resp 18   Wt 99.4 kg   SpO2 97%   BMI 34.32 kg/m  , BMI Body mass index is 34.32 kg/m. GENERAL: Well-appearing.  No acute distress. HEENT: Pupils equal round and reactive, fundi not visualized, oral mucosa unremarkable NECK:  No jugular venous distention, waveform within normal limits, carotid upstroke brisk and symmetric, no bruits, no thyromegaly LUNGS: Bilateral rhonchi anteriorly. HEART:  RRR.  PMI not displaced or sustained,S1 and S2 within normal limits, no S3, no S4, no clicks, no rubs, no murmurs ABD:  Flat, positive bowel sounds normal in frequency in pitch, no bruits, no rebound, no guarding, no midline pulsatile mass, no hepatomegaly, no splenomegaly EXT:  2 plus pulses throughout, 1+ bilateral lower extremity edema, no cyanosis no clubbing SKIN:  No rashes no nodules NEURO:  Cranial nerves II through XII grossly intact, motor grossly intact  throughout [facility]:  Cognitively intact, oriented to person place and time   Labs    High Sensitivity Troponin:   Recent Labs  Lab 04/30/22 0332 04/30/22 0546 04/30/22 1734 04/30/22 1955  TROPONINIHS 373* 1,137* 2,844* 2,210*      Chemistry Recent Labs  Lab 05/03/22 0334 05/04/22 0449 05/06/22 0544 05/07/22 0314 05/08/22 0400 05/09/22 0529  NA 148*   < > 147* 147* 140 142  K 3.6   < > 4.0 4.5 4.2 3.9  CL 110   < > 109 108 103 103  CO2 32   < > '30 31 29 31  '$ GLUCOSE 128*   < > 159* 149* 137* 157*  BUN 39*   < > 55* 41* 32* 23  CREATININE 1.29*   < > 1.13 1.03 0.90 0.70  CALCIUM 7.6*   < > 8.1* 8.0* 8.0* 8.3*  MG  --    < > 2.6* 2.3 2.1  --   PROT 5.7*  --   --   --   --   --   ALBUMIN 2.9*  --   --   --   --   --   AST 336*  --   --   --   --   --   ALT 1,002*  --   --   --    --   --   ALKPHOS 34*  --   --   --   --   --   BILITOT 1.3*  --   --   --   --   --   GFRNONAA 58*   < > >60 >60 >60 >60  ANIONGAP 6   < > '8 8 8 8   '$ < > = values in this interval not displayed.     Lipids No results for input(s): CHOL, TRIG, HDL, LABVLDL, LDLCALC, CHOLHDL in the last 168 hours.  Hematology Recent Labs  Lab 05/07/22 0314 05/08/22 0400 05/09/22 0529  WBC 10.7* 11.6* 12.1*  RBC 4.87 4.98 5.42  HGB 14.5 14.7 16.1  HCT 48.6 49.0 51.6  MCV 99.8 98.4 95.2  MCH 29.8 29.5 29.7  MCHC 29.8* 30.0 31.2  RDW 14.1 13.6 13.4  PLT 128* 135* 150    Thyroid  No results for input(s): TSH, FREET4 in the last 168 hours.   BNP No results for input(s): BNP, PROBNP in the last 168 hours.   DDimer No results for input(s): DDIMER in the last 168 hours.   Radiology    DG Chest Port 1 View  Result Date: 05/09/2022 IMPRESSION: 1. Persistent patchy consolidation in the left lower lobe and small left pleural effusion with improved opacities. 2. Increased basal consolidation or atelectasis and small pleural effusion on the right. 3. Cardiomegaly, aortic atherosclerosis. Electronically Signed   By: Telford Nab M.D.   On: 05/09/2022 03:59    Cardiac Studies   Echo 04/30/2022:  1. Left ventricular ejection fraction, by estimation, is 35 to 40%. The  left ventricle has moderately decreased function. The left ventricle  demonstrates global hypokinesis. There is mild left ventricular  hypertrophy. Left ventricular diastolic  parameters are consistent with Grade I diastolic dysfunction (impaired  relaxation).   2. Right ventricular systolic function is moderately reduced. The right  ventricular size is mildly enlarged.   3. The mitral valve is normal in structure. No evidence of mitral valve  regurgitation.   4. The aortic valve is grossly normal. Aortic valve regurgitation is not  visualized.  Echo 04/20/22:  1. Challenging study, PVCs noted.   2. Left ventricular ejection  fraction, by estimation, is 45 to 50%. The  left ventricle has mildly decreased function. The left ventricle has no  regional wall motion abnormalities. There is mild left ventricular  hypertrophy. Left ventricular diastolic  parameters are consistent with Grade I diastolic dysfunction (impaired  relaxation).   3. Right ventricular systolic function is mildly reduced. The right  ventricular size is normal. Tricuspid regurgitation signal is inadequate  for assessing PA pressure.   4. The mitral valve is normal in structure. Mild to moderate mitral valve  regurgitation. No evidence of mitral stenosis.   5. The aortic valve is normal in structure. Aortic valve regurgitation is  mild. No aortic stenosis is present.   6. There is borderline dilatation of the aortic root, measuring 37 mm.  There is mild dilatation of the ascending aorta, measuring 42 mm.   7. The inferior vena cava is normal in size with greater than 50%  respiratory variability, suggesting right atrial pressure of 3 mmHg.   Patient Profile     76 y.o. male with chronic systolic and diastolic heart failure, PAF, COPD, myasthenia gravis admitted with PEA arrest in the setting of acute on chronic systolic diastolic heart failure and hypoxic respiratory failure in the setting of not taking his diuretics.  Hospitalization also complicated by acute on chronic renal failure.  Assessment & Plan    # Acute on chronic systolic and diastolic heart failure:  LVEF this admission 35 to 40%, down from 45 to 50% previously.  Holding further diuresis in the setting of hyponatremia.  Start losartan 12.5 mg daily.  No beta-blockers given that he has myasthenia gravis.  BP is stable off IV nitroglycerin.  It is possible he will need IVIG to treat acute myasthenia if his weakness does not improve with time.  If necessary we will need to increase diuresis to manage the volume overload.    #PEA arrest: Occurred in the setting of decompensated heart  failure and respiratory failure.  He has known atrial fibrillation and PVCs.  Flecainide currently being held.  Patient was intubated and extubated 5/24.  He had to be reintubated.  Family elected for one-way extubation 5/27 and he has done well.  He will need a repeat ischemic evaluation once more medically stable, possibly later this week.  #Hypertensive emergency: Blood pressures after extubation were in the 177L systolic, now improved.  This has been discontinued and he is doing much better.  Adding losartan as above.  Avoid beta-blocker in the setting of myasthenia gravis.  # PAF:  Currently in sinus rhythm.  He did have some short paroxysms of atrial fibrillation over the weekend.  No beta-blockers in the setting of myasthenia gravis.  No calcium channel blockers in the setting of systolic dysfunction.  If necessary would use amiodarone or digoxin for rate control.  In preparation for ischemic evaluation we will hold apixaban and start heparin drip.      For questions or updates, please contact [place] Please consult [email or web address] for contact info under        Signed, Christell Faith, PA-C  05/09/2022, 8:32 AM

## 2022-05-09 NOTE — Progress Notes (Addendum)
   BRIEF PCCM NOTE   BRIEF PT DESCRIPTION / SYNOPSIS :  76 yo white male with  PEA Cardiac arrest: suspect pulmonary etiology due to pneumonia with Elevated Troponin in setting of Demand Ischemia /NSTEMI, Acute on Chronic combined systolic and diastolic CHF exacerbation, and Paroxsymal Atrial Fibrillation failed trial of extubation due to lung collapse and inability to protect airway with severe resp insufficiency with underlying MG.  SUBJECTIVE / INTERVAL HISTORY :  -This morning complaining of SOB and pain in legs -Able to speak in full sentences, mild tachypnea, O2 sats 94% on 3L [place] -Hemodynamically stable -Coarse breath sounds bilaterally -STAT Chest X-ray: IMPRESSION: 1. Persistent patchy consolidation in the left lower lobe and small left pleural effusion with improved opacities. 2. Increased basal consolidation or atelectasis and small pleural effusion on the right. 3. Cardiomegaly, aortic atherosclerosis. -Denies chest pain, wheezing, cough, N/V/D, abdominal pain  OBJECTIVE :   Today's Vitals   05/08/22 2359 05/09/22 0017 05/09/22 0032 05/09/22 0307  BP: (!) 191/100 (!) 162/89 (!) 182/84 (!) 158/85  Pulse: 61 73 93 (!) 102  Resp: 18   20  Temp: 98.7 F (37.1 C)   98.4 F (36.9 C)  TempSrc:    Oral  SpO2: 92% 99% 96% 94%  Weight:      PainSc:       Body mass index is 37.64 kg/m.   ASSESSMENT / PLAN :   Acute Hypoxic Respiratory Failure in the setting of 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 HCAP, small bilateral pleural effusions, & atelectasis -Supplemental O2 as needed to maintain O2 sats >90% -BiPAP if needed (pt is DNR/DNI) -Follow intermittent Chest X-ray & ABG as needed ~ obtain STAT CXR -Bronchodilators & Pulmicort nebs -IV Steroids -Continue Zosyn -Diuresis as BP and renal function permits ~ will give 40 mg IV Lasix x1 dose 5/30 -Aggressive Pulmonary toilet as able -Prn Dilaudid for air  hunger/dyspnea        Additional Care Time: 30 minutes  Darel Hong, AGACNP-BC [place] epic messenger for cross cover needs If after hours, please call E-link

## 2022-05-09 NOTE — NC FL2 (Signed)
[place] LEVEL OF CARE SCREENING TOOL     IDENTIFICATION  Patient Name: Joseph Macdonald Birthdate: 1946/05/18 Sex: male Admission Date (Current Location): 04/30/2022  [facility] and [place] Number:  Engineering geologist and Address:  [facility], [address], [place], [place] [address]      Provider Number: [id number]  Attending Physician Name and Address:  Nolberto Hanlon, MD  Relative Name and Phone Number:  Demon Volante [phone]    Current Level of Care: Hospital Recommended Level of Care: [place] Prior Approval Number:    Date Approved/Denied:   PASRR Number: [id number]  Discharge Plan: SNF    Current Diagnoses: Patient Active Problem List   Diagnosis Date Noted   Pressure injury of skin 05/09/2022   Hypertensive emergency    Palliative care by specialist    Goals of care, counseling/discussion    DNR (do not resuscitate)    Acute pulmonary edema ([place])    Cardiomyopathy ([place])    Cardiac arrest due to respiratory disorder ([place]) 04/30/2022   Paroxysmal atrial fibrillation (HCC)    HFrEF (heart failure with reduced ejection fraction) ([place])    History of pulmonary embolism 07/02/2020   Vitamin D deficiency 06/17/2019   Acute on chronic kidney failure ([place]) 06/11/2019   Shortness of breath 04/11/2019   Epigastric pain 04/11/2019   Hypokalemia 04/11/2019   Ventricular tachycardia ([place]) 03/12/2019   Chronic heart failure with preserved ejection fraction (HFpEF) ([place]) 03/12/2019   Chest pain 03/12/2019   COPD (chronic obstructive pulmonary disease) ([place]) 01/13/2019   Pre-diabetes 03/19/2017   Chronic narcotic use 01/17/2016   Myasthenia gravis ([place]) 09/17/2015   Anxiety 08/17/2015   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/17/2015   Narrowing of intervertebral disc space 08/17/2015   Clinical depression 08/17/2015   Acid reflux 08/17/2015   Hemorrhoid 08/17/2015   Essential hypertension 08/17/2015   Bad memory  08/17/2015   Neurosis, posttraumatic 08/17/2015   Allergic rhinitis, seasonal 08/17/2015   Smoking greater than 30 pack years 08/17/2015   Restless leg syndrome 08/17/2015    Orientation RESPIRATION BLADDER Height & Weight     Self, Time, Situation, Place  O2 (023L- nasal cannula) Incontinent Weight: 99.4 kg Height:     BEHAVIORAL SYMPTOMS/MOOD NEUROLOGICAL BOWEL NUTRITION STATUS      Incontinent Diet (DYS 3)  AMBULATORY STATUS COMMUNICATION OF NEEDS Skin   Limited Assist Verbally (Dysarthia) Other (Comment) (Erythema R arm bilateral buttocks)                       Personal Care Assistance Level of Assistance  Bathing, Dressing Bathing Assistance: Limited assistance   Dressing Assistance: Limited assistance     Functional Limitations Info             SPECIAL CARE FACTORS FREQUENCY                       Contractures Contractures Info: Not present    Additional Factors Info  Code Status, Allergies Code Status Info: DNR Allergies Info: No Known Allergies           Current Medications (05/09/2022):  This is the current hospital active medication list Current Facility-Administered Medications  Medication Dose Route Frequency Provider Last Rate Last Admin   0.9 %  sodium chloride infusion   Intravenous PRN Ottie Glazier, MD   Stopped at 05/09/22 1434   atropine injection 0.5 mg  0.5 mg Intravenous Once PRN Rust-Chester, Huel Cote,  NP       budesonide (PULMICORT) nebulizer solution 0.25 mg  0.25 mg Nebulization BID Rust-Chester, Britton L, NP   0.25 mg at 05/09/22 0839   chlorhexidine (PERIDEX) 0.12 % solution 15 mL  15 mL Mouth Rinse BID Nelle Don, MD   15 mL at 05/09/22 1003   diazepam (VALIUM) tablet 5 mg  5 mg Oral Q6H PRN Teressa Lower, NP   5 mg at 05/09/22 0228   feeding supplement (ENSURE ENLIVE / ENSURE PLUS) liquid 237 mL  237 mL Oral BID BM Ottie Glazier, MD   237 mL at 05/08/22 1559   heparin ADULT infusion 100 units/mL (25000  units/261m)  1,200 Units/hr Intravenous Continuous POswald Hillock RPH 12 mL/hr at 05/09/22 1500 1,200 Units/hr at 05/09/22 1500   hydrALAZINE (APRESOLINE) injection 20 mg  20 mg Intravenous Q4H PRN Rust-Chester, BHuel Cote NP   20 mg at 05/09/22 0012   HYDROcodone-acetaminophen (NORCO/VICODIN) 5-325 MG per tablet 1 tablet  1 tablet Oral Q6H PRN NTeressa Lower NP   1 tablet at 05/09/22 1010   HYDROmorphone (DILAUDID) injection 0.5 mg  0.5 mg Intravenous Q1H PRN BNelle Don MD   0.5 mg at 05/09/22 1432   insulin aspart (novoLOG) injection 0-20 Units  0-20 Units Subcutaneous TID WC & HS BNelle Don MD   4 Units at 05/09/22 1207   ipratropium-albuterol (DUONEB) 0.5-2.5 (3) MG/3ML nebulizer solution 3 mL  3 mL Nebulization BID BNelle Don MD   3 mL at 05/09/22 0839   losartan (COZAAR) tablet 12.5 mg  12.5 mg Oral Daily DChristell FaithM, PA-C   12.5 mg at 05/09/22 05053  MEDLINE mouth rinse  15 mL Mouth Rinse q12n4p BNelle Don MD   15 mL at 05/08/22 1601   methylPREDNISolone sodium succinate (SOLU-MEDROL) 40 mg/mL injection 20 mg  20 mg Intravenous BID KFlora Lipps MD   20 mg at 05/09/22 1012   multivitamin with minerals tablet 1 tablet  1 tablet Oral Daily AOttie Glazier MD   1 tablet at 05/09/22 0951   OLANZapine zydis (ZYPREXA) disintegrating tablet 5 mg  5 mg Oral BID BNelle Don MD   5 mg at 05/09/22 0950   pantoprazole (PROTONIX) EC tablet 40 mg  40 mg Oral Daily POswald Hillock RPH   40 mg at 05/09/22 0957   piperacillin-tazobactam (ZOSYN) IVPB 3.375 g  3.375 g Intravenous Q8H Kasa, KMaretta Bees MD 12.5 mL/hr at 05/09/22 1500 Infusion Verify at 05/09/22 1500   polyethylene glycol (MIRALAX / GLYCOLAX) packet 17 g  17 g Oral Daily PRN Rust-Chester, BHuel Cote NP       pyridostigmine (MESTINON) tablet 30 mg  30 mg Oral TID Rust-Chester, Britton L, NP   30 mg at 05/09/22 0948   sodium chloride flush (NS) 0.9 % injection 10-40 mL  10-40 mL Intracatheter PRN Rust-Chester, Britton  L, NP   10 mL at 05/09/22 0418   sodium chloride flush (NS) 0.9 % injection 3 mL  3 mL Intravenous Q12H AOttie Glazier MD   3 mL at 05/09/22 09767    Discharge Medications: Please see discharge summary for a list of discharge medications.  Relevant Imaging Results:  Relevant Lab Results:   Additional Information Patient's SS# [id number] KLaurena Slimmer RN

## 2022-05-09 NOTE — Care Management Important Message (Signed)
Important Message  Patient Details  Name: Joseph Macdonald MRN: [id number] Date of Birth: 16-Mar-1946   Medicare Important Message Given:  Yes     Dannette Barbara 05/09/2022, 11:46 AM

## 2022-05-09 NOTE — Consult Note (Signed)
[place] for Heparin Indication: atrial fibrillation  No Known Allergies  Patient Measurements: Weight: 99.4 kg (219 lb 2.2 oz) Heparin Dosing Weight: 87.6 kg  Vital Signs: Temp: 99.2 F (37.3 C) (05/30 0745) Temp Source: Oral (05/30 0745) BP: 150/93 (05/30 0745) Pulse Rate: 91 (05/30 0745)  Labs: Recent Labs    05/07/22 0314 05/08/22 0400 05/09/22 0529  HGB 14.5 14.7 16.1  HCT 48.6 49.0 51.6  PLT 128* 135* 150  CREATININE 1.03 0.90 0.70    Estimated Creatinine Clearance: 89.6 mL/min (by C-G formula based on SCr of 0.7 mg/dL).   Medical History: Past Medical History:  Diagnosis Date   Arthritis    COPD (chronic obstructive pulmonary disease) ([place])    Diabetes mellitus without complication (HCC)    diet controlled   Dyspnea    with activity   Hyperlipidemia    Hypertension    Myasthenia gravis ([place])    Oxygen deficit    Ventricular tachycardia (HCC)     Medications:  Medications Prior to Admission  Medication Sig Dispense Refill Last Dose   apixaban (ELIQUIS) 5 MG TABS tablet Take 5 mg by mouth 2 (two) times daily.   Past Week   budesonide-formoterol (SYMBICORT) 160-4.5 MCG/ACT inhaler Inhale 2 puffs into the lungs 2 (two) times daily. 1 Inhaler 0 Past Week   cholecalciferol (VITAMIN D) 25 MCG (1000 UNIT) tablet Take 2,000 Units by mouth daily.   Past Week   empagliflozin (JARDIANCE) 25 MG TABS tablet Take 25 mg by mouth daily.   Past Week   famotidine (PEPCID) 20 MG tablet Take 20 mg by mouth as needed for heartburn or indigestion.   Past Week   flecainide (TAMBOCOR) 50 MG tablet Take 50 mg by mouth 2 (two) times daily.   Past Week   HYDROcodone-acetaminophen (NORCO/VICODIN) 5-325 MG tablet Take 1 tablet by mouth every 6 (six) hours as needed for moderate pain. 120 tablet 0 prn at prn   omeprazole (PRILOSEC) 40 MG capsule Take 40 mg by mouth 2 (two) times daily.    Past Week   pramipexole (MIRAPEX) 0.5 MG tablet Take 1  tablet (0.5 mg total) by mouth at bedtime. 30 tablet 12 Past Week   pravastatin (PRAVACHOL) 20 MG tablet Take 20 mg by mouth daily.   Past Week   predniSONE (DELTASONE) 20 MG tablet Take 15 mg by mouth daily with breakfast.   Past Week   pyridostigmine (MESTINON) 60 MG tablet Take 30 mg by mouth 3 (three) times daily.   Past Week   terbinafine (LAMISIL) 250 MG tablet Take 250 mg by mouth daily.   Past Week   tiotropium (SPIRIVA) 18 MCG inhalation capsule Place 18 mcg into inhaler and inhale daily.   Past Week   vitamin B-12 (CYANOCOBALAMIN) 1000 MCG tablet Take 1,000 mcg by mouth daily.   Past Week   albuterol (PROVENTIL HFA;VENTOLIN HFA) 108 (90 Base) MCG/ACT inhaler Inhale into the lungs every 6 (six) hours as needed for wheezing or shortness of breath.   prn at prn   torsemide (DEMADEX) 20 MG tablet Take 1 tablet (20 mg) by mouth 3 days a week   prn at prn   Scheduled:   budesonide (PULMICORT) nebulizer solution  0.25 mg Nebulization BID   chlorhexidine  15 mL Mouth Rinse BID   Chlorhexidine Gluconate Cloth  6 each Topical Daily   feeding supplement  237 mL Oral BID BM   insulin aspart  0-20 Units Subcutaneous  TID WC & HS   ipratropium-albuterol  3 mL Nebulization BID   losartan  12.5 mg Oral Daily   mouth rinse  15 mL Mouth Rinse q12n4p   methylPREDNISolone (SOLU-MEDROL) injection  20 mg Intravenous BID   multivitamin with minerals  1 tablet Oral Daily   OLANZapine zydis  5 mg Oral BID   pantoprazole (PROTONIX) IV  40 mg Intravenous Daily   pyridostigmine  30 mg Oral TID   sodium chloride flush  3 mL Intravenous Q12H   Infusions:   sodium chloride 10 mL/hr at 05/09/22 0631   piperacillin-tazobactam (ZOSYN)  IV 3.375 g (05/09/22 0354)   PRN: sodium chloride, atropine, diazepam, hydrALAZINE, HYDROcodone-acetaminophen, HYDROmorphone (DILAUDID) injection, polyethylene glycol, sodium chloride flush Anti-infectives (From admission, onward)    Start     Dose/Rate Route Frequency Ordered  Stop   05/04/22 1400  piperacillin-tazobactam (ZOSYN) IVPB 3.375 g        3.375 g 12.5 mL/hr over 240 Minutes Intravenous Every 8 hours 05/04/22 1023     05/01/22 1800  Ampicillin-Sulbactam (UNASYN) 3 g in sodium chloride 0.9 % 100 mL IVPB  Status:  Discontinued        3 g 200 mL/hr over 30 Minutes Intravenous Every 12 hours 05/01/22 0823 05/01/22 1152   05/01/22 1400  Ampicillin-Sulbactam (UNASYN) 3 g in sodium chloride 0.9 % 100 mL IVPB  Status:  Discontinued        3 g 200 mL/hr over 30 Minutes Intravenous Every 6 hours 05/01/22 1152 05/04/22 1023   04/30/22 1600  Ampicillin-Sulbactam (UNASYN) 3 g in sodium chloride 0.9 % 100 mL IVPB  Status:  Discontinued        3 g 200 mL/hr over 30 Minutes Intravenous Every 6 hours 04/30/22 1418 05/01/22 0823   04/30/22 1500  azithromycin (ZITHROMAX) 500 mg in sodium chloride 0.9 % 250 mL IVPB  Status:  Discontinued        500 mg 250 mL/hr over 60 Minutes Intravenous Every 24 hours 04/30/22 1402 05/01/22 1218       Assessment: 76 y.o. male with chronic systolic and diastolic heart failure, PAF, COPD, myasthenia gravis admitted with PEA arrest in the setting of acute on chronic systolic diastolic heart failure and hypoxic respiratory failure in the setting of not taking his diuretics.Pharmacy consulted to start heparin for afib. Last dose apixaban 5/29 21:57. Will use aPTT for monitoring.   Goal of Therapy:  Heparin level 0.3-0.7 units/ml once heparin level and aPTT correlates.  aPTT 66-102 seconds Monitor platelets by anticoagulation protocol: Yes   Plan:  No bolus  Start heparin infusion at 1200 units/hr Check aPTT level in 8 hours and daily while on heparin. CBC and heparin level daily while on heparin. Switch to heparin level monitoring once correlating.  Continue to monitor H&H and platelets  Oswald Hillock, PharmD, BCPS 05/09/2022,8:18 AM

## 2022-05-09 NOTE — Progress Notes (Signed)
Pt rang out complaining of shortness of breath. VSS.  Pt repositioned and pulled him up in bed.  Changes noted: HR 103 ST (previously SR 70-90s, saturations in low 90s, (previously mid-high 90s), oxygen at [facility].  Has been more anxious since around midnight.  Requested med for sleep.  Valium given at 0230. Darel Hong, NP on call notified to come assess.

## 2022-05-09 NOTE — Progress Notes (Addendum)
Daily Progress Note   Patient Name: Joseph Macdonald       Date: 05/09/2022 DOB: 10-21-1946  Age: 76 y.o. MRN#: [id number] Attending Physician: Nolberto Hanlon, MD Primary Care Physician: [name], MD Admit Date: 04/30/2022 Length of Stay: 9 days  Reason for Consultation/Follow-up: Establishing goals of care  HPI/Patient Profile:  76 y.o. male with past medical history of A-fib on Eliquis, COPD, combined systolic and diastolic CHF, F8BO, HLD, HTN, myasthenia gravis, V. tach.  He presented to the [facility] emergency department in severe respiratory distress and "purple" and before placed on telemetry lost pulses.  He received CPR for 8 minutes with no defibrillations and was emergently intubated for mechanical ventilatory support.  He was admitted on 04/30/2022 with PEA arrest (suspected pulmonary etiology), circulatory shock, acute on chronic combined systolic and diastolic CHF exacerbation, acute hypoxic respiratory failure, AKI in the setting of cardiac arrest, and others.    He was briefly extubated after MRI without evidence of anoxic brain injury but required emergent reintubation shortly thereafter due to left lung white-out from mucus plug s/p bronch removal. Overall prognosis poor.   PMT was consulted for [place] conversations.  Subjective:   Subjective: Chart Reviewed. Updates received. Patient Assessed. Created space and opportunity for patient  and family to explore thoughts and feelings regarding current medical situation.  Today's Discussion: Today I met with the patient at the bedside, his daughter and wife were both present.  We discussed the patient's continued improvement.  He states he did get up today, some shortness of breath with activity.  Overall he feels his breathing is doing better.  Overall he feels he is doing well.  We discussed continuing to take it a day at a time and see how he progresses.  At this point he expresses his wishes for full scope of care.  He  does remain DNR.  His daughter asked to speak to me in the hallway and she expressed concerns about his anxiety and the fact that he is fixated on the idea that "I actually" related to his cardiopulmonary resuscitation from cardiac arrest.  I expressed that this is likely something that will take time for him to deal with and it is not possible to "medicate difficult thoughts away" and she expressed understanding of this.  I encouraged her to speak with the hospitalist about this to see if counseling or psychiatry may be appropriate.  I provided emotional and general response to therapeutic listening, empathy, sharing of stories, therapeutic touch, and other techniques.  I answered all questions and addressed all concerns to the best of my ability.  Review of Systems  Constitutional:  Positive for fatigue.  Respiratory:  Positive for shortness of breath (Specifically with activity; improving).   Gastrointestinal:  Negative for nausea and vomiting.  Neurological:  Positive for weakness.   Objective:   Vital Signs:  BP (!) 150/93 (BP Location: Left Arm)   Pulse 89   Temp 99.2 F (37.3 C) (Oral)   Resp 18   Wt 99.4 kg   SpO2 92%   BMI 34.32 kg/m   Physical Exam: Physical Exam Vitals and nursing note reviewed.  Constitutional:      General: He is not in acute distress.    Appearance: He is ill-appearing.  HENT:     Head: Normocephalic and atraumatic.  Cardiovascular:     Rate and Rhythm: Normal rate.  Pulmonary:     Effort: Pulmonary effort is normal. No respiratory distress.  Abdominal:  General: Abdomen is protuberant. Bowel sounds are normal.     Comments: Abdomen is firm but not tense  Skin:    General: Skin is warm and dry.  Neurological:     General: No focal deficit present.  Psychiatric:        Mood and Affect: Mood normal.        Behavior: Behavior normal.     Comments: Appears anxious    Palliative Assessment/Data: 50%   Assessment & Plan:    Impression: Present on Admission: **None**  76 year old male with multiple chronic comorbidities presents with acute respiratory distress, chest pain, and subsequent cardiac arrest.  ROSC returned after 8 minutes of CPR.  He was previously extubated and required reintubation soon thereafter.  Noted mucous plug cleared on bronchoscopy after reintubation.  He was again extubated and seems to be doing better, is now on 2 L nasal cannula.  Plans to transfer to the floor today. His echocardiogram also shows significant worsening over the past month, query cardiac etiology such as cardiac ischemia. They share that overall he has not had a great quality of life as of late and he would not want a worsening quality of life.  MRI postcardiac arrest with no evidence of anoxic brain injury.  He seems to be doing better since the weekend and still doing well today. He desires full scope of treatment. Overall prognosis remains guarded to poor.   SUMMARY OF RECOMMENDATIONS   Remain DNR Continue to treat the treatable Continued spiritual care Continued emotional support of the patient and family Allow time for outcomes Goals are clear at this point, desires full scope of care PMT will follow peripherally Please notify us of any needs or changes requiring our re-involvement  Symptom Management:  Per primary team PMT is available to assist as needed, especially if the patient transitions to comfort care at some point  Code Status: Limited code  Prognosis: Unable to determine  Discharge Planning: To Be Determined  Discussed with: Medical team, nursing team, patient's family  Thank you for allowing [place] to participate in the care of Joseph Macdonald PMT will continue to support holistically.  Billing based on MDM: High  Problems Addressed: One acute or chronic illness or injury that poses a threat to life or bodily function  Amount and/or Complexity of Data: Category 3:Discussion of management or  test interpretation with external physician/other qualified health care professional/appropriate source (not separately reported)  Risks: Decision not to resuscitate or to de-escalate care because of poor prognosis   [name], NP Palliative Medicine Team  Team Phone # [phone] (Nights/Weekends)  08/09/2021, 8:17 AM

## 2022-05-09 NOTE — Progress Notes (Signed)
PROGRESS NOTE    Joseph Macdonald  WJX:[id number] DOB: [birth year] DOA: 04/30/2022 PCP: [name], MD    Brief Narrative:  76 yo M presenting to [facility] ED via EMS from home on 04/30/22 in respiratory distress. Per documentation patient was found by EMS hypoxic with SpO2 71%-88% placed on NRB with albuterol administered. Family reports the patient was in his normal state of health but had NOT been taking his Lasix prescription. They believe he was taking the rest of his medication as prescribed but did not take his nighttime medications on Saturday. Family denies fever/chills, vomiting, slurred speech, weakness. They did report he had some nausea, one episode of diarrhea and mild nasal congestion as well as a non productive cough. They report he has chronic shortness of breath which started to appear more severe on Saturday.  They confirmed that the patient does not wear oxygen at home. Per report he quit smoking in 2018 after a 50 pack year history, drinks ETOH occasionally and has no recreational drug use history.   ED course: Upon arrival, per EDP report the patient was in severe respiratory distress and "purple". Before they were able to intervene, patient lost pulses. He was not on telemetry yet and it was unclear what was the initial rhythm. Patient received CPR for 8 minutes no defibrillations, and was emergently intubated requiring mechanical ventilatory support. Upon intubation, EDP noted frothy respiratory secretions. ROSC obtained and patient extremely hypertensive with SBP in the 200's. Lasix given and propofol started. SBP subsequently dropped into the 60's, propofol stopped and levophed started. EKG showing some inferolateral STE, Dr. Alfred Levins spoke with STEMI cardiologist Dr. Fletcher Anon who reviewed EKG and determined the patient did not meet STEMI criteria.  PCCM consulted for admission due to acute hypoxic respiratory failure and subsequent cardiac arrest with circulatory shock  requiring mechanical ventilation and vasopressor support  04/30/22: Admit to ICU s/p acute hypoxic respiratory failure and subsequent cardiac arrest with emergent intubation requiring mechanical ventilatory support. 5/22 remains intubated, on vent.  Code Status changed to DNR.  Repeat CTH negative, EEG without evidence of seizures 5/23: Patient withdraws from pain in all extremities with intermittent agitation, plan for MRI Brain & CT Abdomen 5/24: WUA with plans for extubation 5/24 failed extubation, LEFT lung white out on CXR 5/24 re-intubated, severe hypoxia, s/p bronch removed bloody mucus plugs 5/25 remains on vent 5/26 stable on vent overnight 5/27 extubated  5/30 TRH pickup  Consultants:  PCCM, neurology, cardiology  Procedures: s/p extubation  Antimicrobials:  5/21: SARS-CoV-2 and influenza PCR>> negative 5/21: Respiratory viral panel>> negative 5/21: MRSA PCR>> negative 5/21: Tracheal aspirate>> normal respiratory flora   Unasyn 5/21>> Azithromycin 5/21>> 5/22     Subjective: Mildly sob, sitting up in chair , feels weak  Objective: Vitals:   05/09/22 0632 05/09/22 0700 05/09/22 0745 05/09/22 0839  BP:   (!) 150/93   Pulse:   91 89  Resp:  _0 Temp:   99.2 F (37.3 C)   TempSrc:   Oral   SpO2:   97% 92%  Weight: 99.4 kg       Intake/Output Summary (Last 24 hours) at 05/09/2022 0842 Last data filed at 05/09/2022 0745 Gross per 24 hour  Intake 808.84 ml  Output 2901 ml  Net -2092.16 ml   Filed Weights   05/07/22 0345 05/09/22 0100 05/09/22 0632  Weight: 109 kg 101.3 kg 99.4 kg    Examination: Calm, mildly tachypneic with conversation Coarse breath sounds, no  wheezing Reg s1/s2 no gallop Soft benign +bs distended Trace pedal edema Awake and alert Mood and affect appropriate in current setting     Data Reviewed: I have personally reviewed following labs and imaging studies  CBC: Recent Labs  Lab 05/05/22 0446 05/06/22 0544  05/07/22 0314 05/08/22 0400 05/09/22 0529  WBC 9.4 14.0* 10.7* 11.6* 12.1*  HGB 13.7 14.2 14.5 14.7 16.1  HCT 45.5 47.0 48.6 49.0 51.6  MCV 98.3 97.1 99.8 98.4 95.2  PLT 84* 105* 128* 135* 194   Basic Metabolic Panel: Recent Labs  Lab 05/04/22 0449 05/05/22 0446 05/06/22 0544 05/07/22 0314 05/08/22 0400 05/09/22 0529  NA 148* 146* 147* 147* 140 142  K 3.8 3.9 4.0 4.5 4.2 3.9  CL 111 109 109 108 103 103  CO2 _0 GLUCOSE 137* 212* 159* 149* 137* 157*  BUN 43* 50* 55* 41* 32* 23  CREATININE 1.28* 1.10 1.13 1.03 0.90 0.70  CALCIUM 7.3* 7.9* 8.1* 8.0* 8.0* 8.3*  MG 2.5* 2.5* 2.6* 2.3 2.1  --   PHOS 3.6 3.7 4.8* 4.6 3.7  --    GFR: Estimated Creatinine Clearance: 89.6 mL/min (by C-G formula based on SCr of 0.7 mg/dL). Liver Function Tests: Recent Labs  Lab 05/03/22 0334  AST 336*  ALT 1,002*  ALKPHOS 34*  BILITOT 1.3*  PROT 5.7*  ALBUMIN 2.9*   No results for input(s): LIPASE, AMYLASE in the last 168 hours. No results for input(s): AMMONIA in the last 168 hours. Coagulation Profile: No results for input(s): INR, PROTIME in the last 168 hours. Cardiac Enzymes: No results for input(s): CKTOTAL, CKMB, CKMBINDEX, TROPONINI in the last 168 hours. BNP (last 3 results) No results for input(s): PROBNP in the last 8760 hours. HbA1C: No results for input(s): HGBA1C in the last 72 hours. CBG: Recent Labs  Lab 05/07/22 2320 05/08/22 0721 05/08/22 1116 05/08/22 1657 05/08/22 2015  GLUCAP 141* 124* 221* 227* 164*   Lipid Profile: No results for input(s): CHOL, HDL, LDLCALC, TRIG, CHOLHDL, LDLDIRECT in the last 72 hours. Thyroid Function Tests: No results for input(s): TSH, T4TOTAL, FREET4, T3FREE, THYROIDAB in the last 72 hours. Anemia Panel: No results for input(s): VITAMINB12, FOLATE, FERRITIN, TIBC, IRON, RETICCTPCT in the last 72 hours. Sepsis Labs: No results for input(s): PROCALCITON, LATICACIDVEN in the last 168 hours.  Recent Results (from  the past 240 hour(s))  Respiratory (~20 pathogens) panel by PCR     Status: None   Collection Time: 04/30/22  5:37 AM   Specimen: Tracheal Aspirate; Respiratory  Result Value Ref Range Status   Adenovirus NOT DETECTED NOT DETECTED Final   Coronavirus 229E NOT DETECTED NOT DETECTED Final    Comment: (NOTE) The Coronavirus on the Respiratory Panel, DOES NOT test for the novel  Coronavirus (2019 nCoV)    Coronavirus HKU1 NOT DETECTED NOT DETECTED Final   Coronavirus NL63 NOT DETECTED NOT DETECTED Final   Coronavirus OC43 NOT DETECTED NOT DETECTED Final   Metapneumovirus NOT DETECTED NOT DETECTED Final   Rhinovirus / Enterovirus NOT DETECTED NOT DETECTED Final   Influenza A NOT DETECTED NOT DETECTED Final   Influenza B NOT DETECTED NOT DETECTED Final   Parainfluenza Virus 1 NOT DETECTED NOT DETECTED Final   Parainfluenza Virus 2 NOT DETECTED NOT DETECTED Final   Parainfluenza Virus 3 NOT DETECTED NOT DETECTED Final   Parainfluenza Virus 4 NOT DETECTED NOT DETECTED Final   Respiratory Syncytial Virus NOT DETECTED NOT DETECTED Final   Bordetella pertussis  NOT DETECTED NOT DETECTED Final   Bordetella Parapertussis NOT DETECTED NOT DETECTED Final   Chlamydophila pneumoniae NOT DETECTED NOT DETECTED Final   Mycoplasma pneumoniae NOT DETECTED NOT DETECTED Final    Comment: Performed at [facility], [place] [address], [place], [place] [address]  MRSA Next Gen by PCR, Nasal     Status: None   Collection Time: 04/30/22  5:46 AM   Specimen: Nasal Mucosa; Nasal Swab  Result Value Ref Range Status   MRSA by PCR Next Gen NOT DETECTED NOT DETECTED Final    Comment: (NOTE) The GeneXpert MRSA Assay (FDA approved for NASAL specimens only), is one component of a comprehensive MRSA colonization surveillance program. It is not intended to diagnose MRSA infection nor to guide or monitor treatment for MRSA infections. Test performance is not FDA approved in patients less than 58 years old. Performed  at [facility], [address] [place], [place] [address]   Resp Panel by RT-PCR (Flu A&B, Covid)     Status: None   Collection Time: 04/30/22  8:04 AM  Result Value Ref Range Status   SARS Coronavirus 2 by RT PCR NEGATIVE NEGATIVE Final    Comment: (NOTE) SARS-CoV-2 target nucleic acids are NOT DETECTED.  The SARS-CoV-2 RNA is generally detectable in upper respiratory specimens during the acute phase of infection. The lowest concentration of SARS-CoV-2 viral copies this assay can detect is 138 copies/mL. A negative result does not preclude SARS-Cov-2 infection and should not be used as the sole basis for treatment or other patient management decisions. A negative result may occur with  improper specimen collection/handling, submission of specimen other than nasopharyngeal swab, presence of viral mutation(s) within the areas targeted by this assay, and inadequate number of viral copies(<138 copies/mL). A negative result must be combined with clinical observations, patient history, and epidemiological information. The expected result is Negative.  Fact Sheet for Patients:  EntrepreneurPulse.com.au  Fact Sheet for Healthcare Providers:  IncredibleEmployment.be  This test is no t yet approved or cleared by the [place] FDA and  has been authorized for detection and/or diagnosis of SARS-CoV-2 by FDA under an Emergency Use Authorization (EUA). This EUA will remain  in effect (meaning this test can be used) for the duration of the COVID-19 declaration under Section 564(b)(1) of the Act, 21 U.S.C.section 360bbb-3(b)(1), unless the authorization is terminated  or revoked sooner.       Influenza A by PCR NEGATIVE NEGATIVE Final   Influenza B by PCR NEGATIVE NEGATIVE Final    Comment: (NOTE) The Xpert Xpress SARS-CoV-2/FLU/RSV plus assay is intended as an aid in the diagnosis of influenza from Nasopharyngeal swab specimens  and should not be used as a sole basis for treatment. Nasal washings and aspirates are unacceptable for Xpert Xpress SARS-CoV-2/FLU/RSV testing.  Fact Sheet for Patients: EntrepreneurPulse.com.au  Fact Sheet for Healthcare Providers: IncredibleEmployment.be  This test is not yet approved or cleared by the [place] FDA and has been authorized for detection and/or diagnosis of SARS-CoV-2 by FDA under an Emergency Use Authorization (EUA). This EUA will remain in effect (meaning this test can be used) for the duration of the COVID-19 declaration under Section 564(b)(1) of the Act, 21 U.S.C. section 360bbb-3(b)(1), unless the authorization is terminated or revoked.  Performed at [facility], [address] [place], [place] [address]   Culture, Respiratory w Gram Stain     Status: None   Collection Time: 04/30/22  8:04 AM   Specimen: Tracheal  Aspirate; Respiratory  Result Value Ref Range Status   Specimen Description   Final    TRACHEAL ASPIRATE Performed at [facility], [address] [place], [place] [address]    Special Requests   Final    Normal Performed at [facility], [place], [place] [address]    Gram Stain   Final    RARE WBC PRESENT, PREDOMINANTLY PMN RARE GRAM POSITIVE COCCI IN PAIRS    Culture   Final    FEW Normal respiratory flora-no Staph aureus or Pseudomonas seen Performed at [place] [address], [place], [place] [address]    Report Status 05/03/2022 FINAL  Final  Culture, BAL-quantitative w Gram Stain     Status: Abnormal   Collection Time: 05/03/22  5:47 PM   Specimen: Bronchoalveolar Lavage; Respiratory  Result Value Ref Range Status   Specimen Description   Final    BRONCHIAL ALVEOLAR LAVAGE Performed at [facility], [address] [place], [place] [address]    Special Requests   Final    Immunocompromised Performed at [facility], [address] [place], [place] [address]    Gram Stain   Final    ABUNDANT WBC PRESENT, PREDOMINANTLY PMN RARE BUDDING YEAST SEEN RARE GRAM NEGATIVE RODS    Culture (A)  Final    20,000 COLONIES/mL ESCHERICHIA COLI 2,000 COLONIES/mL KLEBSIELLA PNEUMONIAE NO STAPHYLOCOCCUS AUREUS ISOLATED No Pseudomonas species isolated Performed at [facility], [place] [address], [place], [place] [address]    Report Status 05/06/2022 FINAL  Final   Organism ID, Bacteria ESCHERICHIA COLI (A)  Final   Organism ID, Bacteria KLEBSIELLA PNEUMONIAE (A)  Final      Susceptibility   Escherichia coli - MIC*    AMPICILLIN >=32 RESISTANT Resistant     CEFAZOLIN 16 SENSITIVE Sensitive     CEFEPIME <=0.12 SENSITIVE Sensitive     CEFTAZIDIME <=1 SENSITIVE Sensitive     CEFTRIAXONE <=0.25 SENSITIVE Sensitive     CIPROFLOXACIN <=0.25 SENSITIVE Sensitive     GENTAMICIN <=1 SENSITIVE Sensitive     IMIPENEM <=0.25 SENSITIVE Sensitive     TRIMETH/SULFA <=20 SENSITIVE Sensitive     AMPICILLIN/SULBACTAM >=32 RESISTANT Resistant     PIP/TAZO <=4 SENSITIVE Sensitive     * 20,000 COLONIES/mL ESCHERICHIA COLI   Klebsiella pneumoniae - MIC*    AMPICILLIN RESISTANT Resistant     CEFAZOLIN <=4 SENSITIVE Sensitive     CEFEPIME <=0.12 SENSITIVE Sensitive     CEFTAZIDIME <=1 SENSITIVE Sensitive     CEFTRIAXONE <=0.25 SENSITIVE Sensitive     CIPROFLOXACIN <=0.25 SENSITIVE Sensitive     GENTAMICIN <=1 SENSITIVE Sensitive     IMIPENEM <=0.25 SENSITIVE Sensitive     TRIMETH/SULFA <=20 SENSITIVE Sensitive     AMPICILLIN/SULBACTAM 4 SENSITIVE Sensitive     PIP/TAZO <=4 SENSITIVE Sensitive     * 2,000 COLONIES/mL KLEBSIELLA PNEUMONIAE         Radiology Studies: DG Chest Port 1 View  Result Date: 05/09/2022 CLINICAL DATA:  Respiratory distress and failure. EXAM: PORTABLE CHEST 1 VIEW COMPARISON:  Portable chest 05/04/2022. FINDINGS: Interval extubation, removal NGT. The heart is enlarged. The  central vessels are normal caliber. There is a stable mediastinum with aortic atherosclerosis. There are small pleural effusions with persistent patchy consolidation in the left lower lobe but showing some improvement, with increased atelectasis or consolidation in the right base and increased right pleural fluid. The mid and upper lung fields  remain clear. In all other respects there are no further changes. Thoracic spondylosis. IMPRESSION: 1. Persistent patchy consolidation in the left lower lobe and small left pleural effusion with improved opacities. 2. Increased basal consolidation or atelectasis and small pleural effusion on the right. 3. Cardiomegaly, aortic atherosclerosis. Electronically Signed   By: Telford Nab M.D.   On: 05/09/2022 03:59        Scheduled Meds:  budesonide (PULMICORT) nebulizer solution  0.25 mg Nebulization BID   chlorhexidine  15 mL Mouth Rinse BID   Chlorhexidine Gluconate Cloth  6 each Topical Daily   feeding supplement  237 mL Oral BID BM   insulin aspart  0-20 Units Subcutaneous TID WC & HS   ipratropium-albuterol  3 mL Nebulization BID   losartan  12.5 mg Oral Daily   mouth rinse  15 mL Mouth Rinse q12n4p   methylPREDNISolone (SOLU-MEDROL) injection  20 mg Intravenous BID   multivitamin with minerals  1 tablet Oral Daily   OLANZapine zydis  5 mg Oral BID   pantoprazole (PROTONIX) IV  40 mg Intravenous Daily   pyridostigmine  30 mg Oral TID   sodium chloride flush  3 mL Intravenous Q12H   Continuous Infusions:  sodium chloride 10 mL/hr at 05/09/22 0631   heparin     piperacillin-tazobactam (ZOSYN)  IV 3.375 g (05/09/22 7989)    Assessment & Plan:   Principal Problem:   Cardiac arrest due to respiratory disorder ([place]) Active Problems:   Paroxysmal atrial fibrillation (HCC)   HFrEF (heart failure with reduced ejection fraction) ([place])   Cardiomyopathy ([place])   Acute pulmonary edema ([place])   Palliative care by specialist   Goals of care,  counseling/discussion   DNR (do not resuscitate)   Hypertensive emergency   Pressure injury of skin   Acute Hypoxic Respiratory Failure  In the setting of 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 HCAP, small bilateral pleural effusions, & atelectasis. Status post extubation 5/30 mildly tachypnic this am Received IV Lasix On IV steroids Continue Zosyn Continue DuoNebs As needed Dilaudid for air hunger/dyspnea Aggressive pulmonary toiletry PCCM following    Acute on chronic systolic and diastolic heart failure:  Echocardiogram 5/21: LVEF 35-40%, Grade I DD, RV systolic function moderately reduced 5/30 cardiology following, holding further diuresis in setting of hyponatremia No beta-blockers given that he has myasthenia gravis BP stable off IV nitroglycerin If given IVIG and volume overloaded can give diuretics at that time I's and O's Daily weight Started on losartan     #PEA arrest: Occurred in the setting of decompensated heart failure and respiratory failure.  He has known atrial fibrillation and PVCs.  Flecainide currently being held.   Patient was intubated and extubated 5/24.  He had to be reintubated.  5/30 status post extubation on 5/27 again and so far doing well  We will need ischemic evaluation once more medically stable and possibly later this week per cardiology      #Hypertensive emergency: Blood pressures after extubation were in the 211H systolic, now improved.  5/30 off of nitroglycerin drip  avoid beta-blockers in setting of myasthenia gravis  Losartan was added       # PAF:  Currently SR. No beta blk as above No ca channel blok due to chf. If necessary would use amiodarone or digoxin In preparation for ischemic evaluation , holding eliquis, started heparin gtt.      Myasthenia gravis Last emergent immunomodulatory treatment in 2016 for exacerbation at  that time. Neurology following we will discuss about  IVIG for MG flare Continue to check naps Continue Neurochecks Avoid medications known to worsen myasthenia gravis symptoms   AKI Improved Avoid nephrotoxic meds   DVT prophylaxis: Heparin drip Code Status: DNR Family Communication: Wife at bedside Disposition Plan:  Status is: Inpatient Remains inpatient appropriate because: IV treatment , clinically not stable yet.        LOS: 9 days   Time spent: 20mn    SNolberto Hanlon MD [facility] Pager [phone]  If 7PM-7AM, please contact night-coverage 05/09/2022, 8:42 AM

## 2022-05-09 NOTE — Plan of Care (Signed)
  Problem: Clinical Measurements: Goal: Ability to maintain clinical measurements within normal limits will improve Outcome: Progressing Goal: Respiratory complications will improve Outcome: Progressing   VSS.  Complaints of shortness of breath improved  and pt resting quietly after IV Lasix and IV dilaudid ( ordered per on call ICU NP)

## 2022-05-09 NOTE — Progress Notes (Signed)
Physical Therapy Treatment Patient Details Name: Joseph Macdonald MRN: [id number] DOB: 09-14-1946 Today's Date: 05/09/2022   History of Present Illness Pt is a 76 yo M presenting to [facility] ED via EMS from home on 04/30/22 in respiratory distress. Per documentation patient was found by EMS hypoxic with SpO2 71%-88% placed on NRB with albuterol administered. Patient received CPR for 8 minutes in ED with no defibrillations, and was emergently intubated requiring mechanical ventilatory support now extubated. MD assessment includes: Severe ACUTE Hypoxic and Hypercapnic Respiratory Failure, HCAP, acute systolic cardiac failure, Myasthenia Gravis query crisis, AKI, and elevated troponin in setting of demand ischemia.    PT Comments    Pt in bed on entry, agreeable to session. Focused on upright posturing in bed and A/ROM of 4 limbs and 1 head. Pt notably stronger today. Due to recent start of heparin drip, opted to wait on attempting OOB. Pt puts forth great effort. Weakest areas today are with bridging and trunk flexion. VSS throughout session.     Recommendations for follow up therapy are one component of a multi-disciplinary discharge planning process, led by the attending physician.  Recommendations may be updated based on patient status, additional functional criteria and insurance authorization.  Follow Up Recommendations  Skilled nursing-short term rehab (<3 hours/day)     Assistance Recommended at Discharge Frequent or constant Supervision/Assistance  Patient can return home with the following Two people to help with walking and/or transfers;Two people to help with bathing/dressing/bathroom;Assistance with cooking/housework;Help with stairs or ramp for entrance;Assist for transportation   Equipment Recommendations       Recommendations for Other Services       Precautions / Restrictions Precautions Precautions: Fall Restrictions Weight Bearing Restrictions: No     Mobility  Bed  Mobility Overal bed mobility: Needs Assistance             General bed mobility comments: supervision for lateral leaning and pulling self into trunk flexion with BUE pull    Transfers                        Ambulation/Gait                   Stairs             Wheelchair Mobility    Modified Rankin (Stroke Patients Only)       Balance                                            Cognition Arousal/Alertness: Awake/alert Behavior During Therapy: WFL for tasks assessed/performed Overall Cognitive Status: Within Functional Limits for tasks assessed                                          Exercises General Exercises - Upper Extremity Shoulder Extension: Seated, Both, 10 reps, Strengthening General Exercises - Lower Extremity Ankle Circles/Pumps: AROM, Both, 15 reps Short Arc Quad: AROM, Both, 15 reps Heel Slides: AAROM, AROM, Both, 15 reps Hip ABduction/ADduction: AROM, AAROM, Both, 15 reps Hip Flexion/Marching: AROM, Both, 10 reps Mini-Sqauts: Strengthening, Both, 15 reps Other Exercises Other Exercises: seated Cervical extension, flexion, rotation bilat 1x10 for each Other Exercises: trunk flexion pullups into upright c BUE pulling Other Exercises: hooklying bridge 2x10  General Comments        Pertinent Vitals/Pain Pain Assessment Pain Assessment: 0-10 Pain Score: 6  (Rt rib area after coughing spell (CPR several days prior))    Home Living                          Prior Function            PT Goals (current goals can now be found in the care plan section) Acute Rehab PT Goals Patient Stated Goal: To walk better with better endurance PT Goal Formulation: With patient Time For Goal Achievement: 05/21/22 Potential to Achieve Goals: Good Progress towards PT goals: Progressing toward goals    Frequency    Min 2X/week      PT Plan Current plan remains appropriate     Co-evaluation              AM-PAC PT "6 Clicks" Mobility   Outcome Measure  Help needed turning from your back to your side while in a flat bed without using bedrails?: A Lot Help needed moving from lying on your back to sitting on the side of a flat bed without using bedrails?: A Lot Help needed moving to and from a bed to a chair (including a wheelchair)?: A Lot Help needed standing up from a chair using your arms (e.g., wheelchair or bedside chair)?: A Lot Help needed to walk in hospital room?: A Lot Help needed climbing 3-5 steps with a railing? : A Lot 6 Click Score: 12    End of Session Equipment Utilized During Treatment: Oxygen Activity Tolerance: Patient tolerated treatment well;No increased pain Patient left: in bed;with call bell/phone within reach Nurse Communication: Mobility status PT Visit Diagnosis: Muscle weakness (generalized) (M62.81);Difficulty in walking, not elsewhere classified (R26.2)     Time: 5859-2924 PT Time Calculation (min) (ACUTE ONLY): 26 min  Charges:  $Therapeutic Exercise: 23-37 mins                    12:26 PM, 05/09/22 Etta Grandchild, PT, DPT Physical Therapist - [facility]  [phone] ([place])     [place] C 05/09/2022, 12:24 PM

## 2022-05-09 NOTE — Consult Note (Signed)
ANTICOAGULATION CONSULT NOTE   Pharmacy Consult for Heparin Indication: atrial fibrillation  No Known Allergies  Patient Measurements: Weight: 99.4 kg (219 lb 2.2 oz) Heparin Dosing Weight: 87.6 kg  Vital Signs: Temp: 97.7 F (36.5 C) (05/30 1645) Temp Source: Oral (05/30 1645) BP: 142/85 (05/30 1645) Pulse Rate: 94 (05/30 1645)  Labs: Recent Labs    05/07/22 0314 05/08/22 0400 05/09/22 0529 05/09/22 0921 05/09/22 1759  HGB 14.5 14.7 16.1  --   --   HCT 48.6 49.0 51.6  --   --   PLT 128* 135* 150  --   --   APTT  --   --   --  28 36  LABPROT  --   --   --  16.1*  --   INR  --   --   --  1.3*  --   HEPARINUNFRC  --   --   --  >1.10*  --   CREATININE 1.03 0.90 0.70  --   --      Estimated Creatinine Clearance: 89.6 mL/min (by C-G formula based on SCr of 0.7 mg/dL).   Medical History: Past Medical History:  Diagnosis Date   Arthritis    COPD (chronic obstructive pulmonary disease) ([place])    Diabetes mellitus without complication (HCC)    diet controlled   Dyspnea    with activity   Hyperlipidemia    Hypertension    Myasthenia gravis ([place])    Oxygen deficit    Ventricular tachycardia (HCC)     Medications:  Medications Prior to Admission  Medication Sig Dispense Refill Last Dose   apixaban (ELIQUIS) 5 MG TABS tablet Take 5 mg by mouth 2 (two) times daily.   Past Week   budesonide-formoterol (SYMBICORT) 160-4.5 MCG/ACT inhaler Inhale 2 puffs into the lungs 2 (two) times daily. 1 Inhaler 0 Past Week   cholecalciferol (VITAMIN D) 25 MCG (1000 UNIT) tablet Take 2,000 Units by mouth daily.   Past Week   empagliflozin (JARDIANCE) 25 MG TABS tablet Take 25 mg by mouth daily.   Past Week   famotidine (PEPCID) 20 MG tablet Take 20 mg by mouth as needed for heartburn or indigestion.   Past Week   flecainide (TAMBOCOR) 50 MG tablet Take 50 mg by mouth 2 (two) times daily.   Past Week   HYDROcodone-acetaminophen (NORCO/VICODIN) 5-325 MG tablet Take 1 tablet by mouth  every 6 (six) hours as needed for moderate pain. 120 tablet 0 prn at prn   omeprazole (PRILOSEC) 40 MG capsule Take 40 mg by mouth 2 (two) times daily.    Past Week   pramipexole (MIRAPEX) 0.5 MG tablet Take 1 tablet (0.5 mg total) by mouth at bedtime. 30 tablet 12 Past Week   pravastatin (PRAVACHOL) 20 MG tablet Take 20 mg by mouth daily.   Past Week   predniSONE (DELTASONE) 20 MG tablet Take 15 mg by mouth daily with breakfast.   Past Week   pyridostigmine (MESTINON) 60 MG tablet Take 30 mg by mouth 3 (three) times daily.   Past Week   terbinafine (LAMISIL) 250 MG tablet Take 250 mg by mouth daily.   Past Week   tiotropium (SPIRIVA) 18 MCG inhalation capsule Place 18 mcg into inhaler and inhale daily.   Past Week   vitamin B-12 (CYANOCOBALAMIN) 1000 MCG tablet Take 1,000 mcg by mouth daily.   Past Week   albuterol (PROVENTIL HFA;VENTOLIN HFA) 108 (90 Base) MCG/ACT inhaler Inhale into the lungs every 6 (six) hours  as needed for wheezing or shortness of breath.   prn at prn   torsemide (DEMADEX) 20 MG tablet Take 1 tablet (20 mg) by mouth 3 days a week   prn at prn   Scheduled:   chlorhexidine  15 mL Mouth Rinse BID   feeding supplement  237 mL Oral BID BM   insulin aspart  0-20 Units Subcutaneous TID WC & HS   ipratropium-albuterol  3 mL Nebulization BID   losartan  12.5 mg Oral Daily   mouth rinse  15 mL Mouth Rinse q12n4p   multivitamin with minerals  1 tablet Oral Daily   OLANZapine zydis  5 mg Oral BID   pantoprazole  40 mg Oral Daily   pyridostigmine  30 mg Oral TID   sodium chloride flush  3 mL Intravenous Q12H   Infusions:   sodium chloride Stopped (05/09/22 1434)   heparin 1,200 Units/hr (05/09/22 1500)   piperacillin-tazobactam (ZOSYN)  IV 12.5 mL/hr at 05/09/22 1500   PRN: sodium chloride, atropine, diazepam, hydrALAZINE, HYDROcodone-acetaminophen, polyethylene glycol, sodium chloride flush Anti-infectives (From admission, onward)    Start     Dose/Rate Route Frequency  Ordered Stop   05/04/22 1400  piperacillin-tazobactam (ZOSYN) IVPB 3.375 g        3.375 g 12.5 mL/hr over 240 Minutes Intravenous Every 8 hours 05/04/22 1023     05/01/22 1800  Ampicillin-Sulbactam (UNASYN) 3 g in sodium chloride 0.9 % 100 mL IVPB  Status:  Discontinued        3 g 200 mL/hr over 30 Minutes Intravenous Every 12 hours 05/01/22 0823 05/01/22 1152   05/01/22 1400  Ampicillin-Sulbactam (UNASYN) 3 g in sodium chloride 0.9 % 100 mL IVPB  Status:  Discontinued        3 g 200 mL/hr over 30 Minutes Intravenous Every 6 hours 05/01/22 1152 05/04/22 1023   04/30/22 1600  Ampicillin-Sulbactam (UNASYN) 3 g in sodium chloride 0.9 % 100 mL IVPB  Status:  Discontinued        3 g 200 mL/hr over 30 Minutes Intravenous Every 6 hours 04/30/22 1418 05/01/22 0823   04/30/22 1500  azithromycin (ZITHROMAX) 500 mg in sodium chloride 0.9 % 250 mL IVPB  Status:  Discontinued        500 mg 250 mL/hr over 60 Minutes Intravenous Every 24 hours 04/30/22 1402 05/01/22 1218     Heparin Dosing Weight: 87.6 kg  Assessment: 76 y.o. male with chronic systolic and diastolic heart failure, PAF, COPD, myasthenia gravis admitted with PEA arrest in the setting of acute on chronic systolic diastolic heart failure and hypoxic respiratory failure in the setting of not taking his diuretics.Pharmacy consulted to start heparin for afib. Last dose apixaban 5/29 21:57. Will use aPTT for monitoring.   Date Time aPTT/HL Rate/Comment 5/30 0921 28s / >1.1 Baselines labs ; 1200 un/hr (started 10am) 5/30 1800 36s  Subtherapeutic; 1200 > 1500 un/hr    Baseline Labs: aPTT - 28s; INR - 1.3 Hgb - 14.7>16.1; Plts - 135>150  Goal of Therapy:  Heparin level 0.3-0.7 units/ml once heparin level and aPTT correlates.  aPTT 66-102 seconds Monitor platelets by anticoagulation protocol: Yes   Plan:  aPTT subtherapeutic 36s (goal 66-102s); anti-xa not correlating yet d/t DOAC interaction. Continue aPTT monitoring. Give 2600 units  bolus x1; then increase heparin infusion to 1500 units/hr Check aPTT in 8hours from rate change and Heparin level at lease daily for correlation.    Once either is consecutively therapeutic; transition to daily  with AM lab checks.  Titrate by aPTT's until lab correlation is noted, then titrate by anti-xa alone. Continue to monitor H&H and platelets daily while on heparin gtt.  Lorna Dibble, PharmD, BCPS 05/09/2022,7:22 PM

## 2022-05-09 NOTE — Progress Notes (Signed)
Dr. Kurtis Bushman acknowledged secure chat discussing patient feeling slightly short of breath and that dilaudid was given for tachypnea for RR 22 and family's concern about anxiety. RN also made MD aware that nurses concern is for shortness of breath and restlessness. MD acknowledged and stated that patient also has PRN valium on MAR that can be given for anxiety.

## 2022-05-10 DIAGNOSIS — J9601 Acute respiratory failure with hypoxia: Secondary | ICD-10-CM | POA: Diagnosis present

## 2022-05-10 DIAGNOSIS — G7000 Myasthenia gravis without (acute) exacerbation: Secondary | ICD-10-CM | POA: Diagnosis not present

## 2022-05-10 DIAGNOSIS — I5023 Acute on chronic systolic (congestive) heart failure: Secondary | ICD-10-CM | POA: Diagnosis not present

## 2022-05-10 DIAGNOSIS — I161 Hypertensive emergency: Secondary | ICD-10-CM | POA: Diagnosis not present

## 2022-05-10 DIAGNOSIS — I468 Cardiac arrest due to other underlying condition: Secondary | ICD-10-CM | POA: Diagnosis not present

## 2022-05-10 DIAGNOSIS — G7001 Myasthenia gravis with (acute) exacerbation: Secondary | ICD-10-CM | POA: Diagnosis not present

## 2022-05-10 DIAGNOSIS — I480 Paroxysmal atrial fibrillation: Secondary | ICD-10-CM | POA: Diagnosis not present

## 2022-05-10 LAB — APTT
aPTT: 50 seconds — ABNORMAL HIGH (ref 24–36)
aPTT: 82 seconds — ABNORMAL HIGH (ref 24–36)
aPTT: 76 seconds — ABNORMAL HIGH (ref 24–36)

## 2022-05-10 LAB — CBC
HCT: 52.7 % — ABNORMAL HIGH (ref 39.0–52.0)
Hemoglobin: 16.1 g/dL (ref 13.0–17.0)
MCH: 29.6 pg (ref 26.0–34.0)
MCHC: 30.6 g/dL (ref 30.0–36.0)
MCV: 96.9 fL (ref 80.0–100.0)
Platelets: 165 10*3/uL (ref 150–400)
RBC: 5.44 MIL/uL (ref 4.22–5.81)
RDW: 13.4 % (ref 11.5–15.5)
WBC: 14.8 10*3/uL — ABNORMAL HIGH (ref 4.0–10.5)
nRBC: 0 % (ref 0.0–0.2)

## 2022-05-10 LAB — GLUCOSE, CAPILLARY
Glucose-Capillary: 102 mg/dL — ABNORMAL HIGH (ref 70–99)
Glucose-Capillary: 196 mg/dL — ABNORMAL HIGH (ref 70–99)
Glucose-Capillary: 236 mg/dL — ABNORMAL HIGH (ref 70–99)
Glucose-Capillary: 142 mg/dL — ABNORMAL HIGH (ref 70–99)

## 2022-05-10 LAB — HEPARIN LEVEL (UNFRACTIONATED)
Heparin Unfractionated: 0.57 IU/mL (ref 0.30–0.70)
Heparin Unfractionated: 0.9 IU/mL — ABNORMAL HIGH (ref 0.30–0.70)

## 2022-05-10 MED ORDER — DIGOXIN 250 MCG PO TABS: 0.2500 mg | ORAL_TABLET | Freq: Every day | ORAL | Status: DC

## 2022-05-10 MED ORDER — DIGOXIN 250 MCG PO TABS
0.2500 mg | ORAL_TABLET | Freq: Once | ORAL | Status: AC
Start: 2022-05-10 — End: 2022-05-10
  Administered 2022-05-10: 0.25 mg via ORAL
  Filled 2022-05-10: qty 1

## 2022-05-10 MED ORDER — DIGOXIN 125 MCG PO TABS
0.1250 mg | ORAL_TABLET | Freq: Every day | ORAL | Status: DC
  Administered 2022-05-11 – 2022-05-18 (×8): 0.125 mg via ORAL
  Filled 2022-05-10 (×8): qty 1

## 2022-05-10 MED ORDER — HEPARIN BOLUS VIA INFUSION
2600.0000 [IU] | Freq: Once | INTRAVENOUS | Status: AC
  Administered 2022-05-10: 2600 [IU] via INTRAVENOUS
  Filled 2022-05-10: qty 2600

## 2022-05-10 MED ORDER — IMMUNE GLOBULIN (HUMAN) 10 GM/100ML IV SOLN
400.0000 mg/kg | INTRAVENOUS | Status: AC
  Administered 2022-05-10 – 2022-05-14 (×5): 40 g via INTRAVENOUS
  Filled 2022-05-10 (×4): qty 400

## 2022-05-10 NOTE — TOC Progression Note (Signed)
Transition of Care [facility]) - Progression Note    Patient Details  Name: Joseph Macdonald MRN: [id number] Date of Birth: Nov 22, 1946  Transition of Care [facility]) CM/SW [place], [place] Phone Number: 05/10/2022, 11:30 AM  Clinical Narrative:     CSW spoke with patient's spouse [name] and offered bed offers of [facility] or [facility], she reports [facility] would be closest.   [name] reports possibly wanting to go through patient's [facility] for SNF, CSW informed her if she wanted [facility] the only local facility would be [facility] and this CSW is unsure if they have a bed available.   She reports she understands and will discuss with patient and family and let CSW know what they would like to do moving forward.   Expected Discharge Plan:  (TBD) Barriers to Discharge: Continued Medical Work up  Expected Discharge Plan and Services Expected Discharge Plan:  (TBD)       Living arrangements for the past 2 months: Single Family Home                                       Social Determinants of Health (SDOH) Interventions    Readmission Risk Interventions     View : No data to display.

## 2022-05-10 NOTE — Progress Notes (Signed)
[facility] Progress Note  Patient: Joseph Macdonald    FWY:[id number]  DOA: 04/30/2022    Date of Service: the patient was seen and examined on 05/10/2022  Brief hospital course: 76 year old male with past medical history of morbid obesity, myasthenia gravis, COPD, systolic/diastolic CHF, diabetes mellitus and atrial fibrillation on Eliquis presented to the emergency room on 5/21 with hypoxia requiring nonrebreather and then suffered a cardiac arrest upon arrival to the emergency room.  Patient able to be resuscitated.  CT scan revealed aspiration pneumonia.  Patient admitted to the critical care service and placed on ventilator support.  Initially extubated 5/24 but reintubated same day.  Underwent bronchoscopy removing bloody mucous plugs.  Able to be extubated on 5/27.  Patient transferred out of ICU on 5/29.  Cardiology, neurology and palliative care have been following as well.  Patient also looks to be having an acute flareup of his myasthenia gravis and was started on IVIG as of 5/31.  Assessment and Plan: Assessment and Plan: * Cardiac arrest due to respiratory disorder [facility]) Patient with chest wall pain.  No evidence of fractures on x-ray.  Pain control.  Acute respiratory failure with hypoxia (HCC) In the setting of heart failure exacerbation as well as pneumonia.  Status post ventilator support.  Continue steroids and antibiotics and nebulizers.  Pulmonary following.  Improved.  Acute on chronic HFrEF (heart failure with reduced ejection fraction) [facility]) Cardiology following.  Echocardiogram done 5/21 notes ejection fraction of 35 to 40% and grade 1 diastolic dysfunction as well as moderately reduced right ventricular systolic function.  Holding further diuresis in the setting of hyponatremia.  No beta-blockers given myasthenia gravis.  Myasthenia gravis [facility]) Neurology following.  Looks to have a mild myasthenia exacerbation plan is to proceed with IVIG.  Continue  Mestinon.  Hypertensive emergency Blood pressures after extubation were in the 277A systolic.  Weaned off of nitroglycerin drip.  Avoiding beta-blockers.  Added losartan.  Paroxysmal atrial fibrillation (HCC) Currently in sinus rhythm.  If needed, amiodarone or digoxin.  Currently on heparin drip.  Holding Eliquis.  Pressure injury of skin Pressure Injury Buttocks Right Stage 2 -  Partial thickness loss of dermis presenting as a shallow open injury with a red, pink wound bed without slough. (Active)     Location: Buttocks  Location Orientation: Right  Staging: Stage 2 -  Partial thickness loss of dermis presenting as a shallow open injury with a red, pink wound bed without slough.  Wound Description (Comments):   Present on Admission: No   Stage II right buttock ulcer, not present on admission.  Wound care.   Morbid obesity ([place]) Meets criteria BMI greater than 30 and comorbidities of heart failure and hypertension       Body mass index is 35.05 kg/m.  Nutrition Problem: Inadequate oral intake Etiology: inability to eat Pressure Injury Buttocks Right Stage 2 -  Partial thickness loss of dermis presenting as a shallow open injury with a red, pink wound bed without slough. (Active)     Location: Buttocks  Location Orientation: Right  Staging: Stage 2 -  Partial thickness loss of dermis presenting as a shallow open injury with a red, pink wound bed without slough.  Wound Description (Comments):   Present on Admission: No  Dressing Type Foam - Lift dressing to assess site every shift 05/09/22 2130     Consultants: Neurology Critical care Cardiology  Procedures: Echocardiogram done 5/21: Ejection fraction of 35 to 12%, grade 1 diastolic dysfunction.  Moderately reduced right ventricular systolic function  Antimicrobials: Zithromax 5/21 - 5/22 IV Unasyn 5/21 IV Zosyn 5/26-present    Code Status: Now DNR   Subjective: Patient complains of chest wall pain  especially with deep inspiration  Objective: Vital signs were reviewed and unremarkable. Vitals:   05/10/22 0810 05/10/22 1548  BP: (!) 148/83 124/72  Pulse: 87 96  Resp: 17 16  Temp: 98.2 F (36.8 C) 98.2 F (36.8 C)  SpO2:  96%    Intake/Output Summary (Last 24 hours) at 05/10/2022 1859 Last data filed at 05/10/2022 1656 Gross per 24 hour  Intake 1461.13 ml  Output 1725 ml  Net -263.87 ml   Filed Weights   05/09/22 0100 05/09/22 0632 05/10/22 0500  Weight: 101.3 kg 99.4 kg 101.5 kg   Body mass index is 35.05 kg/m.  Exam:  General: Alert and oriented x2, no acute distress HEENT: Normocephalic, atraumatic, mucous membranes are slightly dry.  Narrow airway Cardiovascular: Regular rate and rhythm, Z6-S0, 2/6 systolic ejection murmur Respiratory: Decreased breath sounds throughout Abdomen: Soft, obese, nontender, positive bowel sounds Musculoskeletal: Noted chest wall tenderness.  No clubbing or cyanosis, trace pitting edema Skin: No skin breaks, tears or lesions Psychiatry: Appropriate, no evidence of psychoses  Data Reviewed: There are no new results to review at this time.  Disposition:  Status is: Inpatient Remains inpatient appropriate because: Stabilization of respiratory function and myasthenia exacerbation    Anticipated discharge date: 6/5 Family Communication: Sister-in-law at the bedside DVT Prophylaxis: SCDs Start: 04/30/22 0350    Author: Annita Brod ,MD 05/10/2022 6:59 PM  To reach On-call, see care teams to locate the attending and reach out via [email or web address]. Between 7PM-7AM, please contact night-coverage If you still have difficulty reaching the attending provider, please page the [facility] (Director on Call) for [facility] on amion for assistance.

## 2022-05-10 NOTE — Assessment & Plan Note (Addendum)
Neurology following.  Looks to have a mild myasthenia exacerbation and started on IVIG day 5/5.  Can stop Mestinon per neurology

## 2022-05-10 NOTE — Assessment & Plan Note (Addendum)
Currently in sinus rhythm.  Continue digoxin.  Holding Eliquis.  Was on heparin drip but on hold due to hematuria.  Resume heparin if hematuria improves or slows down later tonight or first in the morning

## 2022-05-10 NOTE — Progress Notes (Signed)
Subjective: Family is at the bedside. Daughter and wife states that his breathing is worse and that he looks weaker today.   Objective: Current vital signs: BP 138/80 (BP Location: Left Arm)   Pulse 90   Temp 98 F (36.7 C)   Resp 18   Wt 101.5 kg   SpO2 95%   BMI 35.05 kg/m  Vital signs in last 24 hours: Temp:  [97.7 F (36.5 C)-98.5 F (36.9 C)] 98 F (36.7 C) (05/31 0335) Pulse Rate:  [78-94] 90 (05/31 0100) Resp:  [14-22] 18 (05/31 0335) BP: (138-164)/(80-92) 138/80 (05/31 0335) SpO2:  [95 %-98 %] 95 % (05/31 0335) Weight:  [101.5 kg] 101.5 kg (05/31 0500)  Intake/Output from previous day: 05/30 0701 - 05/31 0700 In: 1063.4 [P.O.:600; I.V.:281.4; IV Piggyback:182] Out: 1875 [OMVEH:2094] Intake/Output this shift: No intake/output data recorded. Nutritional status:  Diet Order             DIET DYS 3 Room service appropriate? Yes with Assist; Fluid consistency: Thin  Diet effective now                  HEENT: [place]/AT Lungs: Somewhat shallow breaths when conversing at the bedside Ext: Chronic bruising and thin skin x 4.    Neurological exam: Ment: Intact to complex questions and commands. No aphasia noted.  CN: Fixates normally. Bilateral ptosis is present, but improved from Monday. Horizontal EOM intact but with fatiguability noted and cannot fully supraduct. Speech mildly dysarhric. Bulbar weakness is noted.  Motor:  BUE: 4-/5 grip, triceps, deltoid; 4+/5 biceps.   BLE: 4/5 proximally and distally Sensory: Intact to FT x 4 Reflexes: 1+ bilateral patellae and brachioradialis Other: Can only count to 6 on a full breath  Lab Results: Results for orders placed or performed during the hospital encounter of 04/30/22 (from the past 48 hour(s))  Glucose, capillary     Status: Abnormal   Collection Time: 05/08/22  4:57 PM  Result Value Ref Range   Glucose-Capillary 227 (H) 70 - 99 mg/dL    Comment: Glucose reference range applies only to samples taken after  fasting for at least 8 hours.  Glucose, capillary     Status: Abnormal   Collection Time: 05/08/22  8:15 PM  Result Value Ref Range   Glucose-Capillary 164 (H) 70 - 99 mg/dL    Comment: Glucose reference range applies only to samples taken after fasting for at least 8 hours.   Comment 1 Notify RN   CBC     Status: Abnormal   Collection Time: 05/09/22  5:29 AM  Result Value Ref Range   WBC 12.1 (H) 4.0 - 10.5 K/uL   RBC 5.42 4.22 - 5.81 MIL/uL   Hemoglobin 16.1 13.0 - 17.0 g/dL   HCT 51.6 39.0 - 52.0 %   MCV 95.2 80.0 - 100.0 fL   MCH 29.7 26.0 - 34.0 pg   MCHC 31.2 30.0 - 36.0 g/dL   RDW 13.4 11.5 - 15.5 %   Platelets 150 150 - 400 K/uL   nRBC 0.0 0.0 - 0.2 %    Comment: Performed at [facility], [address] [place], [place] [address]  Basic metabolic panel     Status: Abnormal   Collection Time: 05/09/22  5:29 AM  Result Value Ref Range   Sodium 142 135 - 145 mmol/L   Potassium 3.9 3.5 - 5.1 mmol/L   Chloride 103 98 - 111 mmol/L   CO2 31 22 - 32 mmol/L  Glucose, Bld 157 (H) 70 - 99 mg/dL    Comment: Glucose reference range applies only to samples taken after fasting for at least 8 hours.   BUN 23 8 - 23 mg/dL   Creatinine, Ser 0.70 0.61 - 1.24 mg/dL   Calcium 8.3 (L) 8.9 - 10.3 mg/dL   GFR, Estimated >60 >60 mL/min    Comment: (NOTE) Calculated using the CKD-EPI Creatinine Equation (2021)    Anion gap 8 5 - 15    Comment: Performed at [facility], [address] [place], [place] [address]  Glucose, capillary     Status: Abnormal   Collection Time: 05/09/22  9:15 AM  Result Value Ref Range   Glucose-Capillary 128 (H) 70 - 99 mg/dL    Comment: Glucose reference range applies only to samples taken after fasting for at least 8 hours.  APTT     Status: None   Collection Time: 05/09/22  9:21 AM  Result Value Ref Range   aPTT 28 24 - 36 seconds    Comment: Performed at [facility], [place], [place] [address]  Heparin  level (unfractionated)     Status: Abnormal   Collection Time: 05/09/22  9:21 AM  Result Value Ref Range   Heparin Unfractionated >1.10 (H) 0.30 - 0.70 IU/mL    Comment: (NOTE) The clinical reportable range upper limit is being lowered to >1.10 to align with the FDA approved guidance for the current laboratory assay.  If heparin results are below expected values, and patient dosage has  been confirmed, suggest follow up testing of antithrombin III levels. Performed at [facility], [address] [place], [place] [address]   Protime-INR     Status: Abnormal   Collection Time: 05/09/22  9:21 AM  Result Value Ref Range   Prothrombin Time 16.1 (H) 11.4 - 15.2 seconds   INR 1.3 (H) 0.8 - 1.2    Comment: (NOTE) INR goal varies based on device and disease states. Performed at [facility], [address] [place], [place] [address]   Glucose, capillary     Status: Abnormal   Collection Time: 05/09/22 12:05 PM  Result Value Ref Range   Glucose-Capillary 161 (H) 70 - 99 mg/dL    Comment: Glucose reference range applies only to samples taken after fasting for at least 8 hours.  Blood gas, arterial     Status: Abnormal   Collection Time: 05/09/22  4:16 PM  Result Value Ref Range   O2 Content 3.0 L/min   Delivery systems NASAL CANNULA    pH, Arterial 7.43 7.35 - 7.45   pCO2 arterial 49 (H) 32 - 48 mmHg   pO2, Arterial 79 (L) 83 - 108 mmHg   Bicarbonate 32.5 (H) 20.0 - 28.0 mmol/L   Acid-Base Excess 6.7 (H) 0.0 - 2.0 mmol/L   O2 Saturation 97 %   Patient temperature 37.0    Collection site RIGHT RADIAL    Drawn by ARTERIAL DRAW    Allens test (pass/fail) PASS PASS    Comment: Performed at [facility], [address] [place], [place] [address]  Glucose, capillary     Status: Abnormal   Collection Time: 05/09/22  4:27 PM  Result Value Ref Range   Glucose-Capillary 252 (H) 70 - 99 mg/dL    Comment: Glucose reference range applies only to samples  taken after fasting for at least 8 hours.  APTT     Status: None   Collection Time:  05/09/22  5:59 PM  Result Value Ref Range   aPTT 36 24 - 36 seconds    Comment: Performed at [facility], [address] [place], [place] [address]  Glucose, capillary     Status: Abnormal   Collection Time: 05/09/22  9:39 PM  Result Value Ref Range   Glucose-Capillary 119 (H) 70 - 99 mg/dL    Comment: Glucose reference range applies only to samples taken after fasting for at least 8 hours.  Heparin level (unfractionated)     Status: None   Collection Time: 05/10/22  4:51 AM  Result Value Ref Range   Heparin Unfractionated 0.57 0.30 - 0.70 IU/mL    Comment: (NOTE) The clinical reportable range upper limit is being lowered to >1.10 to align with the FDA approved guidance for the current laboratory assay.  If heparin results are below expected values, and patient dosage has  been confirmed, suggest follow up testing of antithrombin III levels. Performed at [facility], [address] [place], [place] [address]   CBC     Status: Abnormal   Collection Time: 05/10/22  4:51 AM  Result Value Ref Range   WBC 14.8 (H) 4.0 - 10.5 K/uL   RBC 5.44 4.22 - 5.81 MIL/uL   Hemoglobin 16.1 13.0 - 17.0 g/dL   HCT 52.7 (H) 39.0 - 52.0 %   MCV 96.9 80.0 - 100.0 fL   MCH 29.6 26.0 - 34.0 pg   MCHC 30.6 30.0 - 36.0 g/dL   RDW 13.4 11.5 - 15.5 %   Platelets 165 150 - 400 K/uL   nRBC 0.0 0.0 - 0.2 %    Comment: Performed at [facility], [address] [place], [place] [address]  APTT     Status: Abnormal   Collection Time: 05/10/22  4:51 AM  Result Value Ref Range   aPTT 50 (H) 24 - 36 seconds    Comment:        IF BASELINE aPTT IS ELEVATED, SUGGEST PATIENT RISK ASSESSMENT BE USED TO DETERMINE APPROPRIATE ANTICOAGULANT THERAPY. Performed at [facility], [address] [place], [place] [address]   Glucose, capillary     Status: Abnormal   Collection Time:  05/10/22  8:29 AM  Result Value Ref Range   Glucose-Capillary 102 (H) 70 - 99 mg/dL    Comment: Glucose reference range applies only to samples taken after fasting for at least 8 hours.  Glucose, capillary     Status: Abnormal   Collection Time: 05/10/22  1:01 PM  Result Value Ref Range   Glucose-Capillary 196 (H) 70 - 99 mg/dL    Comment: Glucose reference range applies only to samples taken after fasting for at least 8 hours.    Recent Results (from the past 240 hour(s))  Culture, BAL-quantitative w Gram Stain     Status: Abnormal   Collection Time: 05/03/22  5:47 PM   Specimen: Bronchoalveolar Lavage; Respiratory  Result Value Ref Range Status   Specimen Description   Final    BRONCHIAL ALVEOLAR LAVAGE Performed at [facility], [address] [place], [place] [address]    Special Requests   Final    Immunocompromised Performed at [facility], [address] [place], [place] [address]    Gram Stain   Final    ABUNDANT WBC PRESENT, PREDOMINANTLY PMN RARE BUDDING YEAST SEEN RARE GRAM NEGATIVE RODS    Culture (A)  Final    20,000 COLONIES/mL ESCHERICHIA COLI 2,000 COLONIES/mL  KLEBSIELLA PNEUMONIAE NO STAPHYLOCOCCUS AUREUS ISOLATED No Pseudomonas species isolated Performed at [facility] [address], [place], [place] [address]    Report Status 05/06/2022 FINAL  Final   Organism ID, Bacteria ESCHERICHIA COLI (A)  Final   Organism ID, Bacteria KLEBSIELLA PNEUMONIAE (A)  Final      Susceptibility   Escherichia coli - MIC*    AMPICILLIN >=32 RESISTANT Resistant     CEFAZOLIN 16 SENSITIVE Sensitive     CEFEPIME <=0.12 SENSITIVE Sensitive     CEFTAZIDIME <=1 SENSITIVE Sensitive     CEFTRIAXONE <=0.25 SENSITIVE Sensitive     CIPROFLOXACIN <=0.25 SENSITIVE Sensitive     GENTAMICIN <=1 SENSITIVE Sensitive     IMIPENEM <=0.25 SENSITIVE Sensitive     TRIMETH/SULFA <=20 SENSITIVE Sensitive     AMPICILLIN/SULBACTAM >=32 RESISTANT Resistant      PIP/TAZO <=4 SENSITIVE Sensitive     * 20,000 COLONIES/mL ESCHERICHIA COLI   Klebsiella pneumoniae - MIC*    AMPICILLIN RESISTANT Resistant     CEFAZOLIN <=4 SENSITIVE Sensitive     CEFEPIME <=0.12 SENSITIVE Sensitive     CEFTAZIDIME <=1 SENSITIVE Sensitive     CEFTRIAXONE <=0.25 SENSITIVE Sensitive     CIPROFLOXACIN <=0.25 SENSITIVE Sensitive     GENTAMICIN <=1 SENSITIVE Sensitive     IMIPENEM <=0.25 SENSITIVE Sensitive     TRIMETH/SULFA <=20 SENSITIVE Sensitive     AMPICILLIN/SULBACTAM 4 SENSITIVE Sensitive     PIP/TAZO <=4 SENSITIVE Sensitive     * 2,000 COLONIES/mL KLEBSIELLA PNEUMONIAE    Lipid Panel No results for input(s): CHOL, TRIG, HDL, CHOLHDL, VLDL, LDLCALC in the last 72 hours.  Studies/Results: DG Chest Port 1 View  Result Date: 05/09/2022 CLINICAL DATA:  Respiratory distress and failure. EXAM: PORTABLE CHEST 1 VIEW COMPARISON:  Portable chest 05/04/2022. FINDINGS: Interval extubation, removal NGT. The heart is enlarged. The central vessels are normal caliber. There is a stable mediastinum with aortic atherosclerosis. There are small pleural effusions with persistent patchy consolidation in the left lower lobe but showing some improvement, with increased atelectasis or consolidation in the right base and increased right pleural fluid. The mid and upper lung fields remain clear. In all other respects there are no further changes. Thoracic spondylosis. IMPRESSION: 1. Persistent patchy consolidation in the left lower lobe and small left pleural effusion with improved opacities. 2. Increased basal consolidation or atelectasis and small pleural effusion on the right. 3. Cardiomegaly, aortic atherosclerosis. Electronically Signed   By: Telford Nab M.D.   On: 05/09/2022 03:59    Medications: Scheduled:  chlorhexidine  15 mL Mouth Rinse BID   feeding supplement  237 mL Oral BID BM   insulin aspart  0-20 Units Subcutaneous TID WC & HS   ipratropium-albuterol  3 mL Nebulization  BID   losartan  12.5 mg Oral Daily   mouth rinse  15 mL Mouth Rinse q12n4p   multivitamin with minerals  1 tablet Oral Daily   OLANZapine zydis  5 mg Oral BID   pantoprazole  40 mg Oral Daily   pyridostigmine  30 mg Oral TID   sodium chloride flush  3 mL Intravenous Q12H   Continuous:  sodium chloride Stopped (05/09/22 1434)   heparin 1,700 Units/hr (05/10/22 0653)   piperacillin-tazobactam (ZOSYN)  IV 12.5 mL/hr at 05/10/22 0601    Assessment: 76 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 He shows multiple findings on examination c/w at least mild myasthenia exacerbation including mild fatigable upgaze and ptosis, weak neck flexion and bulbar weakness as well as diplopia. Given that he was just extubated on Saturday and was subjectively improving, plan on Sunday was to continue to observe for further improvement. If he was not further significantly improved from a strength and respiratory standpoint, then plan was to treat with IVIG for MG flare.  - Dr. Quinn Axe discussed potential IVIG treatment with Dr. Oval Linsey of Cardiology on Sunday. Cards is amenable to this plan and will work with [place] to more aggressively diurese him during IVIG tx to offset the volume load, if IVIG is determined to be indicated.  - On repeat exam today, he has bulbar weakness and is mildly tachypneic with conversation.  - The patient's overall pattern of weakness is most consistent with there being a significant component attributable to his myasthenia gravis, despite diuresis as well as there having been significant time to recover from intubation/sedation. Discussed risks/benefits of IVIG treatment with the patient and his family, who have provided informed consent to proceed with IVIG.    Recommendations: - Check NIF  and FVC at regular intervals.  - Frequent neuro checks - Avoid medications known to worsen myasthenia gravis symptoms.  - Currently off diuretics due to hyponatremia. Cardiology to watch closely and diurese as needed in the context of IVIG treatment over the next 5 days.  - IVIG orders have been placed.  - Continue Mestinon 30 mg po TID.  - Continue heparin gtt for PAF   LOS: 10 days   _0  signed: Dr. Kerney Elbe 05/10/2022  1:19 PM

## 2022-05-10 NOTE — Assessment & Plan Note (Addendum)
In the setting of heart failure exacerbation as well as pneumonia.  Status post ventilator support.  Continue steroids, antibiotics and nebulizers.  Pulmonary following.  Improved.

## 2022-05-10 NOTE — Consult Note (Signed)
ANTICOAGULATION CONSULT NOTE   Pharmacy Consult for IV Heparin Indication: atrial fibrillation  Patient Measurements: Weight: 101.5 kg (223 lb 12.3 oz) Heparin Dosing Weight: 87.6 kg  Labs: Recent Labs    05/08/22 0400 05/09/22 0529 05/09/22 0921 05/09/22 0921 05/09/22 1759 05/10/22 0451 05/10/22 1431  HGB 14.7 16.1  --   --   --  16.1  --   HCT 49.0 51.6  --   --   --  52.7*  --   PLT 135* 150  --   --   --  165  --   APTT  --   --  28   < > 36 50* 82*  LABPROT  --   --  16.1*  --   --   --   --   INR  --   --  1.3*  --   --   --   --   HEPARINUNFRC  --   --  >1.10*  --   --  0.57 0.90*  CREATININE 0.90 0.70  --   --   --   --   --    < > = values in this interval not displayed.    Estimated Creatinine Clearance: 90.6 mL/min (by C-G formula based on SCr of 0.7 mg/dL).  Medical History: Past Medical History:  Diagnosis Date   Arthritis    COPD (chronic obstructive pulmonary disease) ([place])    Diabetes mellitus without complication (HCC)    diet controlled   Dyspnea    with activity   Hyperlipidemia    Hypertension    Myasthenia gravis ([place])    Oxygen deficit    Ventricular tachycardia (HCC)     Assessment: 76 y.o. male with chronic systolic and diastolic heart failure, PAF, COPD, myasthenia gravis admitted with PEA arrest in the setting of acute on chronic systolic diastolic heart failure and hypoxic respiratory failure in the setting of not taking his diuretics.Pharmacy consulted to start heparin for afib. Last dose apixaban 5/29 21:57. Will use aPTT for monitoring.   Date Time aPTT/HL Rate/Comment 5/30 0921 28s / >1.1 Baselines labs ; 1200 un/hr (started 10am) 5/30 1800 36s  Subtherapeutic; 1200 > 1500 un/hr   5/31 0451 50s/0.57 aPTT subtherapeutic 5/31 1431 83s/0.90 aPTT therapeutic x 1  Baseline Labs: aPTT - 28s; INR - 1.3 Hgb - 14.7>16.1; Plts - 135>150  Goal of Therapy:  Heparin level 0.3-0.7 units/ml once heparin level and aPTT correlates.  aPTT  66-102 seconds Monitor platelets by anticoagulation protocol: Yes   Plan:  --aPTT is therapeutic x 1 --Continue heparin infusion at 1700 units/hr --Check confirmatory aPTT in 8 hours --Daily CBC per protocol while on IV heparin  [name]  05/10/2022 2:58 PM

## 2022-05-10 NOTE — Assessment & Plan Note (Addendum)
Patient with chest wall pain.  No evidence of fractures on x-ray.  Pain control, cardiology planning cardiac cath tomorrow

## 2022-05-10 NOTE — Progress Notes (Addendum)
Cardiology Progress Note   Patient Name: Joseph Macdonald Date of Encounter: 05/10/2022  Primary Cardiologist: [name], MD  Subjective   Feels like weakness is improving some.  Breathing stable.  Still on O2, which he was not on at home prior to admission.  Notes chest wall tenderness.  Inpatient Medications    Scheduled Meds:  chlorhexidine  15 mL Mouth Rinse BID   feeding supplement  237 mL Oral BID BM   insulin aspart  0-20 Units Subcutaneous TID WC & HS   ipratropium-albuterol  3 mL Nebulization BID   losartan  12.5 mg Oral Daily   mouth rinse  15 mL Mouth Rinse q12n4p   multivitamin with minerals  1 tablet Oral Daily   OLANZapine zydis  5 mg Oral BID   pantoprazole  40 mg Oral Daily   pyridostigmine  30 mg Oral TID   sodium chloride flush  3 mL Intravenous Q12H   Continuous Infusions:  sodium chloride Stopped (05/09/22 1434)   heparin 1,700 Units/hr (05/10/22 0653)   piperacillin-tazobactam (ZOSYN)  IV 12.5 mL/hr at 05/10/22 0601   PRN Meds: sodium chloride, atropine, diazepam, hydrALAZINE, HYDROcodone-acetaminophen, polyethylene glycol, sodium chloride flush   Vital Signs    Vitals:   05/10/22 0000 05/10/22 0100 05/10/22 0335 05/10/22 0500  BP:  (!) 164/92 138/80   Pulse:  90    Resp: '14 18 18   '$ Temp:   98 F (36.7 C)   TempSrc:      SpO2:  96% 95%   Weight:    101.5 kg    Intake/Output Summary (Last 24 hours) at 05/10/2022 1032 Last data filed at 05/10/2022 0601 Gross per 24 hour  Intake 1063.39 ml  Output 1175 ml  Net -111.61 ml   Filed Weights   05/09/22 0100 05/09/22 0632 05/10/22 0500  Weight: 101.3 kg 99.4 kg 101.5 kg    Physical Exam   GEN: Obese, in no acute distress.  HEENT: Grossly normal.  Neck: Supple, difficult to gauge JVP 2/2 body habitus.  No carotid bruits, or masses. Cardiac: RRR, no murmurs, rubs, or gallops. No clubbing, cyanosis, edema.  Radials 2+, DP/PT 2+ and equal bilaterally. Midsternal chest wall tender to  palpation. Respiratory:  Respirations regular and unlabored, markedly diminished breath sounds @ bilateral bases. GI: Obese, soft, nontender, nondistended, BS + x 4. MS: no deformity or atrophy. Skin: warm and dry, no rash. Neuro:  Strength and sensation are intact. Psych: AAOx3.  Normal affect.  Labs    Chemistry Recent Labs  Lab 05/07/22 0314 05/08/22 0400 05/09/22 0529  NA 147* 140 142  K 4.5 4.2 3.9  CL 108 103 103  CO2 '31 29 31  '$ GLUCOSE 149* 137* 157*  BUN 41* 32* 23  CREATININE 1.03 0.90 0.70  CALCIUM 8.0* 8.0* 8.3*  GFRNONAA >60 >60 >60  ANIONGAP '8 8 8     '$ Hematology Recent Labs  Lab 05/08/22 0400 05/09/22 0529 05/10/22 0451  WBC 11.6* 12.1* 14.8*  RBC 4.98 5.42 5.44  HGB 14.7 16.1 16.1  HCT 49.0 51.6 52.7*  MCV 98.4 95.2 96.9  MCH 29.5 29.7 29.6  MCHC 30.0 31.2 30.6  RDW 13.6 13.4 13.4  PLT 135* 150 165    Cardiac Enzymes  Recent Labs  Lab 04/30/22 0332 04/30/22 0546 04/30/22 1734 04/30/22 1955  TROPONINIHS 373* 1,137* 2,844* 2,210*      BNP    Component Value Date/Time   BNP 971.6 (H) 04/30/2022 0332    Lipids  Lab Results  Component Value Date   CHOL 141 04/03/2022   HDL 62 04/03/2022   LDLCALC 62 04/03/2022   TRIG 92 04/03/2022   CHOLHDL 2.3 04/03/2022    HbA1c  Lab Results  Component Value Date   HGBA1C 6.9 (H) 04/30/2022    Radiology    DG Chest Port 1 View  Result Date: 05/09/2022 CLINICAL DATA:  Respiratory distress and failure. EXAM: PORTABLE CHEST 1 VIEW COMPARISON:  Portable chest 05/04/2022. FINDINGS: Interval extubation, removal NGT. The heart is enlarged. The central vessels are normal caliber. There is a stable mediastinum with aortic atherosclerosis. There are small pleural effusions with persistent patchy consolidation in the left lower lobe but showing some improvement, with increased atelectasis or consolidation in the right base and increased right pleural fluid. The mid and upper lung fields remain clear. In  all other respects there are no further changes. Thoracic spondylosis. IMPRESSION: 1. Persistent patchy consolidation in the left lower lobe and small left pleural effusion with improved opacities. 2. Increased basal consolidation or atelectasis and small pleural effusion on the right. 3. Cardiomegaly, aortic atherosclerosis. Electronically Signed   By: Telford Nab M.D.   On: 05/09/2022 03:59    Telemetry    Predominantly sinus rhythm and sinus tachycardia w/ PVCs, though he did have paroxysms of PAF overnight w/ rates into the 120's - Personally Reviewed  Cardiac Studies   Echo 04/30/2022:  1. Left ventricular ejection fraction, by estimation, is 35 to 40%. The  left ventricle has moderately decreased function. The left ventricle  demonstrates global hypokinesis. There is mild left ventricular  hypertrophy. Left ventricular diastolic  parameters are consistent with Grade I diastolic dysfunction (impaired  relaxation).   2. Right ventricular systolic function is moderately reduced. The right  ventricular size is mildly enlarged.   3. The mitral valve is normal in structure. No evidence of mitral valve  regurgitation.   4. The aortic valve is grossly normal. Aortic valve regurgitation is not  visualized.    Echo 04/20/22:  1. Challenging study, PVCs noted.   2. Left ventricular ejection fraction, by estimation, is 45 to 50%. The  left ventricle has mildly decreased function. The left ventricle has no  regional wall motion abnormalities. There is mild left ventricular  hypertrophy. Left ventricular diastolic  parameters are consistent with Grade I diastolic dysfunction (impaired  relaxation).   3. Right ventricular systolic function is mildly reduced. The right  ventricular size is normal. Tricuspid regurgitation signal is inadequate  for assessing PA pressure.   4. The mitral valve is normal in structure. Mild to moderate mitral valve  regurgitation. No evidence of mitral stenosis.    5. The aortic valve is normal in structure. Aortic valve regurgitation is  mild. No aortic stenosis is present.   6. There is borderline dilatation of the aortic root, measuring 37 mm.  There is mild dilatation of the ascending aorta, measuring 42 mm.   7. The inferior vena cava is normal in size with greater than 50%  respiratory variability, suggesting right atrial pressure of 3 mmHg.    Patient Profile     76 y.o. male with chronic systolic and diastolic heart failure, PAF, COPD, myasthenia gravis admitted with PEA arrest in the setting of acute on chronic systolic diastolic heart failure and hypoxic respiratory failure in the setting of not taking his diuretics.  Hospitalization also complicated by acute on chronic renal failure.  Assessment & Plan    1.  Acute on  chronic combined systolic and diastolic CHF:  EF 01-31% by echo this admission. minus 811 ml yesterday and minus 7.5L since admission.  Not currently on diuretic therapy in setting of hypernatremia, which has improved (Na 140 - 5/29, 142 - 5/30).  Volume appears stable.  Titrate ARB therapy.  ? blocker relatively contraindicated in setting of myasthenia gravis.  Provided that BPs remain stable, will look to transition ARB to entresto and add MRA prior to discharge.  Consider SGLT2i.    2.  PEA Arrest/NSTEMI:  Presented to the ED 5/21 w/ resp failure and suffered PEA arrest in the setting of hypoxia s/p 8 mins of CPR w/ ROSC.  Has residual chest wall pain, otw stable.  As previously noted, plan for R & L cath as strength and resp status improve - ? Friday.  3.  HTN Emergency:  BP improved after initiation of losartan yesterday.  Follow throughout the day and titrate if necessary.  4.  PAF:  pt w/ PAF overnight - rates into 120's.  No symptoms reported.  He is on heparin.  ? blocker relatively contraindicated in setting of MG.  No CCB 2/2 LV dysfunction.  I suspect he will have recurrent PAF in setting of acute illness and will  require AAD therapy.  Was on flecainide but d/c'd in setting of reduced EF.  Amio is possibility but not ideal in setting of COPD.  D/w Dr. Garen Lah.  Will start digoxin.  5.  Myasthenia Gravis:  per neuro.  Signed, Murray Hodgkins, NP  05/10/2022, 10:32 AM    For questions or updates, please contact   Please consult [email or web address] for contact info under Cardiology/STEMI.

## 2022-05-10 NOTE — Assessment & Plan Note (Addendum)
Pressure Injury Buttocks Right Stage 2 -  Partial thickness loss of dermis presenting as a shallow open injury with a red, pink wound bed without slough. (Active)     Location: Buttocks  Location Orientation: Right  Staging: Stage 2 -  Partial thickness loss of dermis presenting as a shallow open injury with a red, pink wound bed without slough.  Wound Description (Comments):   Present on Admission: No   Stage II right buttock ulcer, not present on admission.  Wound care per wound care nurse

## 2022-05-10 NOTE — Assessment & Plan Note (Addendum)
Meets criteria BMI greater than 30 and comorbidities of heart failure and hypertension.  Complicates overall prognosis

## 2022-05-10 NOTE — Progress Notes (Signed)
Occupational Therapy Treatment Patient Details Name: Joseph Macdonald MRN: [id number] DOB: [birth year] Today's Date: 05/10/2022   History of present illness Pt is a 76 yo M presenting to [facility] ED via EMS from home on 04/30/22 in respiratory distress. Per documentation patient was found by EMS hypoxic with SpO2 71%-88% placed on NRB with albuterol administered. Patient received CPR for 8 minutes in ED with no defibrillations, and was emergently intubated requiring mechanical ventilatory support now extubated. MD assessment includes: Severe ACUTE Hypoxic and Hypercapnic Respiratory Failure, HCAP, acute systolic cardiac failure, Myasthenia Gravis query crisis, AKI, and elevated troponin in setting of demand ischemia.   OT comments  Pt in bed upon OT arrival.  Agreeable to bed level activity d/t high pain levels at sternum from CPR, nursing aware, and fatigue from earlier PT session.  Despite pain, pt very cooperative and hard-working.  Pt participated in bed level oral care, R/L rolling, and BUE AROM exercises to promote circulation, strengthening, and building activity tolerance for ADLs.  Pt required cues for PLB and deepening breaths throughout above noted activities.  Shallow breaths d/t sternal pain, but improved with multi level cueing.  02 sats remained 90-94% on 3L throughout session.  Pt will continue to benefit from skilled OT to maximize performance with ADLs and functional mobility.     Recommendations for follow up therapy are one component of a multi-disciplinary discharge planning process, led by the attending physician.  Recommendations may be updated based on patient status, additional functional criteria and insurance authorization.    Follow Up Recommendations  Skilled nursing-short term rehab (<3 hours/day)    Assistance Recommended at Discharge Intermittent Supervision/Assistance  Patient can return home with the following  Two people to help with walking and/or transfers;Two people  to help with bathing/dressing/bathroom   Equipment Recommendations  Other (comment) (defer to next venue of care)    Recommendations for Other Services      Precautions / Restrictions Precautions Precautions: Fall Restrictions Weight Bearing Restrictions: No Other Position/Activity Restrictions: CVC Triple Lumen Right Femoral, flexible type, no mobility restrictions per NP 05/08/22       Mobility Bed Mobility Overal bed mobility: Needs Assistance Bed Mobility: Rolling Rolling: Min assist         General bed mobility comments: Rolled R/L, pt able to hold self in side lying using bed rail x1 min each way.  Cues for foot position/knee bend to help push self to side lying. Patient Response: Cooperative  Transfers                   General transfer comment: NT this afternoon.  Pt fatigued from PT session and was in great discomfort with sternal pain, but was agreeable to bed level activities.     Balance       Sitting balance - Comments: NT this session       Standing balance comment: NT this session                           ADL either performed or assessed with clinical judgement   ADL Overall ADL's : Needs assistance/impaired     Grooming: Oral care;Set up;Bed level Grooming Details (indicate cue type and reason): required assist for twist open cap; able to squeeze paste onto tooth brush  Extremity/Trunk Assessment Upper Extremity Assessment Upper Extremity Assessment: Generalized weakness   Lower Extremity Assessment Lower Extremity Assessment: Generalized weakness        Vision Baseline Vision/History: 1 Wears glasses Patient Visual Report: No change from baseline                Cognition Arousal/Alertness: Awake/alert Behavior During Therapy: WFL for tasks assessed/performed Overall Cognitive Status: Within Functional Limits for tasks assessed                                  General Comments: Pt is A and O x3. SOB noted quickly with limited activity        Exercises Other Exercises Other Exercises: BUE AROM: digit, wrist, and elbow flex/ext, shoulder flex, ER to top of head, cross body reaching to opposite bed rail x10 reps bilaterally.  Instructed in PLB throughout, with cues for deeper inhale, longer exhale.  Pt limited to shallow breathing d/t sternal pain but deeper breaths noted with verbal and visual cueing from OT.    Shoulder Instructions       General Comments Reddened R buttock and upper thighwidely spread, noted upon rolling,  Skin slightly peeling.    Pertinent Vitals/ Pain       Pain Assessment Pain Score: 9  Pain Location: sternal pain from CPR Pain Descriptors / Indicators: Sore, Aching Pain Intervention(s): Limited activity within patient's tolerance, Monitored during session, Repositioned, Relaxation (RN not yet able to give additional pain meds)  Home Living                                                        Frequency  Min 2X/week        Progress Toward Goals  OT Goals(current goals can now be found in the care plan section)  Progress towards OT goals: Progressing toward goals  Acute Rehab OT Goals Patient Stated Goal: to regain strength OT Goal Formulation: With patient Time For Goal Achievement: 05/22/22 Potential to Achieve Goals: Good  Plan Discharge plan remains appropriate    Co-evaluation                 AM-PAC OT "6 Clicks" Daily Activity     Outcome Measure   Help from another person eating meals?: None Help from another person taking care of personal grooming?: A Little Help from another person toileting, which includes using toliet, bedpan, or urinal?: A Lot Help from another person bathing (including washing, rinsing, drying)?: A Lot Help from another person to put on and taking off regular upper body clothing?: A Little Help from another person to put on  and taking off regular lower body clothing?: A Lot 6 Click Score: 16    End of Session Equipment Utilized During Treatment: Oxygen  OT Visit Diagnosis: Unsteadiness on feet (R26.81);Muscle weakness (generalized) (M62.81);Pain Pain - Right/Left: Left Pain - part of body:  (L sided sternal pain)   Activity Tolerance Patient tolerated treatment well   Patient Left in bed;with call bell/phone within reach;with bed alarm set   Nurse Communication Mobility status        Time: 9562-1308 OT Time Calculation (min): 30 min  Charges: OT General Charges $OT Visit: 1 Visit OT Treatments $Self Care/Home Management : 8-22  mins $Therapeutic Exercise: 8-22 mins  Leta Speller, MS, OTR/L   Darleene Cleaver 05/10/2022, 7:31 PM

## 2022-05-10 NOTE — Hospital Course (Addendum)
76 year old male with past medical history of morbid obesity, myasthenia gravis, COPD, systolic/diastolic CHF, diabetes mellitus and atrial fibrillation on Eliquis presented to the emergency room on 5/21 with hypoxia requiring nonrebreather and then suffered a cardiac arrest upon arrival to the emergency room.  Patient able to be resuscitated.  CT scan revealed aspiration pneumonia.  Patient admitted to the critical care service and placed on ventilator support.  Initially extubated 5/24 but reintubated same day.  Underwent bronchoscopy removing bloody mucous plugs.  Able to be extubated on 5/27.  Patient transferred out of ICU on 5/29.  Cardiology, neurology and palliative care have been following as well.  Patient also looks to be having an acute flareup of his myasthenia gravis and was started on IVIG as of 5/31.  With IVIG, patient has also been started on aggressive diuresis and is currently diuresed over 19 L and is more than 8.5 L deficient.  Plan is for cardiac catheterization.

## 2022-05-10 NOTE — Consult Note (Signed)
ANTICOAGULATION CONSULT NOTE   Pharmacy Consult for Heparin Indication: atrial fibrillation  No Known Allergies  Patient Measurements: Weight: 101.5 kg (223 lb 12.3 oz) Heparin Dosing Weight: 87.6 kg  Vital Signs: Temp: 98 F (36.7 C) (05/31 0335) Temp Source: Oral (05/30 2008) BP: 138/80 (05/31 0335) Pulse Rate: 90 (05/31 0100)  Labs: Recent Labs    05/08/22 0400 05/09/22 0529 05/09/22 0921 05/09/22 1759 05/10/22 0451  HGB 14.7 16.1  --   --  16.1  HCT 49.0 51.6  --   --  52.7*  PLT 135* 150  --   --  165  APTT  --   --  28 36 50*  LABPROT  --   --  16.1*  --   --   INR  --   --  1.3*  --   --   HEPARINUNFRC  --   --  >1.10*  --  0.57  CREATININE 0.90 0.70  --   --   --      Estimated Creatinine Clearance: 90.6 mL/min (by C-G formula based on SCr of 0.7 mg/dL).   Medical History: Past Medical History:  Diagnosis Date   Arthritis    COPD (chronic obstructive pulmonary disease) ([place])    Diabetes mellitus without complication (HCC)    diet controlled   Dyspnea    with activity   Hyperlipidemia    Hypertension    Myasthenia gravis ([place])    Oxygen deficit    Ventricular tachycardia (HCC)     Medications:  Medications Prior to Admission  Medication Sig Dispense Refill Last Dose   apixaban (ELIQUIS) 5 MG TABS tablet Take 5 mg by mouth 2 (two) times daily.   Past Week   budesonide-formoterol (SYMBICORT) 160-4.5 MCG/ACT inhaler Inhale 2 puffs into the lungs 2 (two) times daily. 1 Inhaler 0 Past Week   cholecalciferol (VITAMIN D) 25 MCG (1000 UNIT) tablet Take 2,000 Units by mouth daily.   Past Week   empagliflozin (JARDIANCE) 25 MG TABS tablet Take 25 mg by mouth daily.   Past Week   famotidine (PEPCID) 20 MG tablet Take 20 mg by mouth as needed for heartburn or indigestion.   Past Week   flecainide (TAMBOCOR) 50 MG tablet Take 50 mg by mouth 2 (two) times daily.   Past Week   HYDROcodone-acetaminophen (NORCO/VICODIN) 5-325 MG tablet Take 1 tablet by mouth  every 6 (six) hours as needed for moderate pain. 120 tablet 0 prn at prn   omeprazole (PRILOSEC) 40 MG capsule Take 40 mg by mouth 2 (two) times daily.    Past Week   pramipexole (MIRAPEX) 0.5 MG tablet Take 1 tablet (0.5 mg total) by mouth at bedtime. 30 tablet 12 Past Week   pravastatin (PRAVACHOL) 20 MG tablet Take 20 mg by mouth daily.   Past Week   predniSONE (DELTASONE) 20 MG tablet Take 15 mg by mouth daily with breakfast.   Past Week   pyridostigmine (MESTINON) 60 MG tablet Take 30 mg by mouth 3 (three) times daily.   Past Week   terbinafine (LAMISIL) 250 MG tablet Take 250 mg by mouth daily.   Past Week   tiotropium (SPIRIVA) 18 MCG inhalation capsule Place 18 mcg into inhaler and inhale daily.   Past Week   vitamin B-12 (CYANOCOBALAMIN) 1000 MCG tablet Take 1,000 mcg by mouth daily.   Past Week   albuterol (PROVENTIL HFA;VENTOLIN HFA) 108 (90 Base) MCG/ACT inhaler Inhale into the lungs every 6 (six) hours as needed  for wheezing or shortness of breath.   prn at prn   torsemide (DEMADEX) 20 MG tablet Take 1 tablet (20 mg) by mouth 3 days a week   prn at prn   Scheduled:   chlorhexidine  15 mL Mouth Rinse BID   feeding supplement  237 mL Oral BID BM   insulin aspart  0-20 Units Subcutaneous TID WC & HS   ipratropium-albuterol  3 mL Nebulization BID   losartan  12.5 mg Oral Daily   mouth rinse  15 mL Mouth Rinse q12n4p   multivitamin with minerals  1 tablet Oral Daily   OLANZapine zydis  5 mg Oral BID   pantoprazole  40 mg Oral Daily   pyridostigmine  30 mg Oral TID   sodium chloride flush  3 mL Intravenous Q12H   Infusions:   sodium chloride Stopped (05/09/22 1434)   heparin 1,500 Units/hr (05/10/22 0601)   piperacillin-tazobactam (ZOSYN)  IV 12.5 mL/hr at 05/10/22 0601   PRN: sodium chloride, atropine, diazepam, hydrALAZINE, HYDROcodone-acetaminophen, polyethylene glycol, sodium chloride flush Anti-infectives (From admission, onward)    Start     Dose/Rate Route Frequency  Ordered Stop   05/04/22 1400  piperacillin-tazobactam (ZOSYN) IVPB 3.375 g        3.375 g 12.5 mL/hr over 240 Minutes Intravenous Every 8 hours 05/04/22 1023     05/01/22 1800  Ampicillin-Sulbactam (UNASYN) 3 g in sodium chloride 0.9 % 100 mL IVPB  Status:  Discontinued        3 g 200 mL/hr over 30 Minutes Intravenous Every 12 hours 05/01/22 0823 05/01/22 1152   05/01/22 1400  Ampicillin-Sulbactam (UNASYN) 3 g in sodium chloride 0.9 % 100 mL IVPB  Status:  Discontinued        3 g 200 mL/hr over 30 Minutes Intravenous Every 6 hours 05/01/22 1152 05/04/22 1023   04/30/22 1600  Ampicillin-Sulbactam (UNASYN) 3 g in sodium chloride 0.9 % 100 mL IVPB  Status:  Discontinued        3 g 200 mL/hr over 30 Minutes Intravenous Every 6 hours 04/30/22 1418 05/01/22 0823   04/30/22 1500  azithromycin (ZITHROMAX) 500 mg in sodium chloride 0.9 % 250 mL IVPB  Status:  Discontinued        500 mg 250 mL/hr over 60 Minutes Intravenous Every 24 hours 04/30/22 1402 05/01/22 1218     Heparin Dosing Weight: 87.6 kg  Assessment: 76 y.o. male with chronic systolic and diastolic heart failure, PAF, COPD, myasthenia gravis admitted with PEA arrest in the setting of acute on chronic systolic diastolic heart failure and hypoxic respiratory failure in the setting of not taking his diuretics.Pharmacy consulted to start heparin for afib. Last dose apixaban 5/29 21:57. Will use aPTT for monitoring.   Date Time aPTT/HL Rate/Comment 5/30 0921 28s / >1.1 Baselines labs ; 1200 un/hr (started 10am) 5/30 1800 36s  Subtherapeutic; 1200 > 1500 un/hr   5/31 0451 50s/0.57 aPTT subtherapeutic  Baseline Labs: aPTT - 28s; INR - 1.3 Hgb - 14.7>16.1; Plts - 135>150  Goal of Therapy:  Heparin level 0.3-0.7 units/ml once heparin level and aPTT correlates.  aPTT 66-102 seconds Monitor platelets by anticoagulation protocol: Yes   Plan:  aPTT subtherapeutic 50s (goal 66-102s); anti-xa not correlating yet d/t DOAC interaction.  Continue aPTT monitoring. Give 2600 units bolus x1; then increase heparin infusion to 1700 units/hr Check aPTT in 8hours from rate change and Heparin level at lease daily for correlation.    Once either is consecutively therapeutic;  transition to daily with AM lab checks.  Titrate by aPTT's until lab correlation is noted, then titrate by anti-xa alone. Continue to monitor H&H and platelets daily while on heparin gtt.  Renda Rolls, PharmD, [facility] 05/10/2022 6:16 AM

## 2022-05-10 NOTE — Progress Notes (Signed)
Physical Therapy Treatment Patient Details Name: Joseph Macdonald MRN: [id number] DOB: May 18, 1946 Today's Date: 05/10/2022   History of Present Illness Pt is a 76 yo M presenting to [facility] ED via EMS from home on 04/30/22 in respiratory distress. Per documentation patient was found by EMS hypoxic with SpO2 71%-88% placed on NRB with albuterol administered. Patient received CPR for 8 minutes in ED with no defibrillations, and was emergently intubated requiring mechanical ventilatory support now extubated. MD assessment includes: Severe ACUTE Hypoxic and Hypercapnic Respiratory Failure, HCAP, acute systolic cardiac failure, Myasthenia Gravis query crisis, AKI, and elevated troponin in setting of demand ischemia.    PT Comments    Pt was long sitting in bed with RN at bedside. He is awake and oriented. Agrees to session but c/o sternal pain from where CPR was administered. HR maintained less than 115 bpm with very limited activity. Pt becomes SOB with progressing from log sit to short sit. Pt was on 3 L o2. Attempted to wean however pt desaturates to 83% on rm air. SOB greatly limits session progression. He tolerated standing 2 x EOB and even attempted to take steps along side of bed. Pt unable due to weakness and fatigue. Pt puts forth great effort throughout session. He is frustrated with lack of strength. Acute PT will continue to follow and progress as able per current POC. Recommend DC to rehab to improve strength, activity tolerance, and overall safety with ADLs.    Recommendations for follow up therapy are one component of a multi-disciplinary discharge planning process, led by the attending physician.  Recommendations may be updated based on patient status, additional functional criteria and insurance authorization.  Follow Up Recommendations  Skilled nursing-short term rehab (<3 hours/day)     Assistance Recommended at Discharge Frequent or constant Supervision/Assistance  Patient can return  home with the following Two people to help with walking and/or transfers;Two people to help with bathing/dressing/bathroom;Assistance with cooking/housework;Help with stairs or ramp for entrance;Assist for transportation   Equipment Recommendations   (TBD)       Precautions / Restrictions Precautions Precautions: Fall Restrictions Weight Bearing Restrictions: No     Mobility  Bed Mobility Overal bed mobility: Needs Assistance Bed Mobility: Supine to Sit, Sit to Supine     Supine to sit: Mod assist Sit to supine: Mod assist   General bed mobility comments: mod assist to exit R sid eof bed + mod assist to return to supine. HOB elevated + heavy use of bed rails.    Transfers Overall transfer level: Needs assistance Equipment used: Rolling walker (2 wheels) Transfers: Sit to/from Stand Sit to Stand: Mod assist, Max assist, From elevated surface      General transfer comment: mod assist from elevated surface, max assist form lowest bed height. Pt is SOB with just sitting EOB. sao2 desaturates to 83% on room air. reapplied 3 L o2 and pt recovers to >90%    Ambulation/Gait  General Gait Details: pt attempted to take steps along EOB however due to weakness and fatigue, is unable.    Balance Overall balance assessment: Needs assistance Sitting-balance support: Feet supported, Bilateral upper extremity supported Sitting balance-Leahy Scale: Good     Standing balance support: Bilateral upper extremity supported, During functional activity Standing balance-Leahy Scale: Poor Standing balance comment: pt struggles to maintain standing > 20 sec due to fatigue and weakness. Very limited activity tolerance    Cognition Arousal/Alertness: Awake/alert Behavior During Therapy: WFL for tasks assessed/performed Overall Cognitive Status: Within Functional  Limits for tasks assessed      General Comments: Pt is A and O x3. SOB noted quickly with limited activity                Pertinent Vitals/Pain Pain Assessment Pain Assessment: 0-10 Pain Score: 6  Pain Location: sternal pain from CPR Pain Descriptors / Indicators: Sore, Aching Pain Intervention(s): Limited activity within patient's tolerance, Monitored during session, Premedicated before session, Repositioned     PT Goals (current goals can now be found in the care plan section) Acute Rehab PT Goals Patient Stated Goal: To walk better with better endurance Progress towards PT goals: Progressing toward goals       PT Plan Current plan remains appropriate    Co-evaluation     PT goals addressed during session: Mobility/safety with mobility;Balance;Proper use of DME;Strengthening/ROM        AM-PAC PT "6 Clicks" Mobility   Outcome Measure  Help needed turning from your back to your side while in a flat bed without using bedrails?: A Lot Help needed moving from lying on your back to sitting on the side of a flat bed without using bedrails?: A Lot Help needed moving to and from a bed to a chair (including a wheelchair)?: A Lot Help needed standing up from a chair using your arms (e.g., wheelchair or bedside chair)?: A Lot Help needed to walk in hospital room?: A Lot Help needed climbing 3-5 steps with a railing? : A Lot 6 Click Score: 12    End of Session Equipment Utilized During Treatment: Oxygen (3L. Desaturates when attempted to wean) Activity Tolerance: Patient limited by fatigue Patient left: in bed;with call bell/phone within reach Nurse Communication: Mobility status PT Visit Diagnosis: Muscle weakness (generalized) (M62.81);Difficulty in walking, not elsewhere classified (R26.2) Pain - Right/Left: Left Pain - part of body: Hip     Time: 0258-5277 PT Time Calculation (min) (ACUTE ONLY): 33 min  Charges:  $Therapeutic Activity: 23-37 mins                     Julaine Fusi PTA 05/10/22, 1:58 PM

## 2022-05-10 NOTE — Assessment & Plan Note (Addendum)
Cardiology following.  Echocardiogram done 5/21 notes ejection fraction of 35 to 40% and grade 1 diastolic dysfunction as well as moderately reduced right ventricular systolic function.  Initially held further diuresis in the setting of hyponatremia.  No beta-blockers given myasthenia gravis.  Restarted on IV Lasix twice daily which will be stopped following completion of IVIG.  Patient has responded Diuresed well is -14 L deficient.  Plan is for cardiac catheterization tomorrow per cardiology note  Net IO Since Admission: -14,708.43 mL [05/14/22 1358]

## 2022-05-10 NOTE — Assessment & Plan Note (Addendum)
Blood pressures after extubation were in the 734Y systolic.  Weaned off of nitroglycerin drip.  Avoiding beta-blockers.  Continue losartan, Lasix and as needed hydralazine

## 2022-05-11 ENCOUNTER — Other Ambulatory Visit: Payer: Self-pay

## 2022-05-11 DIAGNOSIS — I468 Cardiac arrest due to other underlying condition: Secondary | ICD-10-CM | POA: Diagnosis not present

## 2022-05-11 DIAGNOSIS — J989 Respiratory disorder, unspecified: Secondary | ICD-10-CM | POA: Diagnosis not present

## 2022-05-11 DIAGNOSIS — Z515 Encounter for palliative care: Secondary | ICD-10-CM | POA: Diagnosis not present

## 2022-05-11 DIAGNOSIS — I5023 Acute on chronic systolic (congestive) heart failure: Secondary | ICD-10-CM | POA: Diagnosis not present

## 2022-05-11 DIAGNOSIS — Z66 Do not resuscitate: Secondary | ICD-10-CM | POA: Diagnosis not present

## 2022-05-11 DIAGNOSIS — I480 Paroxysmal atrial fibrillation: Secondary | ICD-10-CM | POA: Diagnosis not present

## 2022-05-11 DIAGNOSIS — I161 Hypertensive emergency: Secondary | ICD-10-CM | POA: Diagnosis not present

## 2022-05-11 DIAGNOSIS — J9601 Acute respiratory failure with hypoxia: Secondary | ICD-10-CM

## 2022-05-11 DIAGNOSIS — Z7189 Other specified counseling: Secondary | ICD-10-CM | POA: Diagnosis not present

## 2022-05-11 LAB — BASIC METABOLIC PANEL
Anion gap: 4 — ABNORMAL LOW (ref 5–15)
BUN: 19 mg/dL (ref 8–23)
CO2: 34 mmol/L — ABNORMAL HIGH (ref 22–32)
Calcium: 8.2 mg/dL — ABNORMAL LOW (ref 8.9–10.3)
Chloride: 101 mmol/L (ref 98–111)
Creatinine, Ser: 0.88 mg/dL (ref 0.61–1.24)
GFR, Estimated: >60 mL/min (ref >60)
Glucose, Bld: 113 mg/dL — ABNORMAL HIGH (ref 70–99)
Potassium: 4 mmol/L (ref 3.5–5.1)
Sodium: 139 mmol/L (ref 135–145)

## 2022-05-11 LAB — GLUCOSE, CAPILLARY
Glucose-Capillary: 112 mg/dL — ABNORMAL HIGH (ref 70–99)
Glucose-Capillary: 152 mg/dL — ABNORMAL HIGH (ref 70–99)
Glucose-Capillary: 143 mg/dL — ABNORMAL HIGH (ref 70–99)
Glucose-Capillary: 166 mg/dL — ABNORMAL HIGH (ref 70–99)

## 2022-05-11 LAB — CBC
HCT: 52.8 % — ABNORMAL HIGH (ref 39.0–52.0)
Hemoglobin: 16.2 g/dL (ref 13.0–17.0)
MCH: 29.9 pg (ref 26.0–34.0)
MCHC: 30.7 g/dL (ref 30.0–36.0)
MCV: 97.6 fL (ref 80.0–100.0)
Platelets: 176 10*3/uL (ref 150–400)
RBC: 5.41 MIL/uL (ref 4.22–5.81)
RDW: 13.6 % (ref 11.5–15.5)
WBC: 10.9 10*3/uL — ABNORMAL HIGH (ref 4.0–10.5)
nRBC: 0 % (ref 0.0–0.2)

## 2022-05-11 LAB — HEPARIN LEVEL (UNFRACTIONATED)
Heparin Unfractionated: 0.96 IU/mL — ABNORMAL HIGH (ref 0.30–0.70)
Heparin Unfractionated: 0.49 IU/mL (ref 0.30–0.70)

## 2022-05-11 LAB — APTT: aPTT: 103 seconds — ABNORMAL HIGH (ref 24–36)

## 2022-05-11 MED ORDER — FUROSEMIDE 10 MG/ML IJ SOLN
40.0000 mg | Freq: Two times a day (BID) | INTRAMUSCULAR | Status: DC
  Administered 2022-05-11 – 2022-05-13 (×5): 40 mg via INTRAVENOUS
  Filled 2022-05-11 (×5): qty 4

## 2022-05-11 NOTE — TOC Progression Note (Addendum)
Transition of Care [facility]) - Progression Note    Patient Details  Name: Joseph Macdonald MRN: [id number] Date of Birth: [birth year]  Transition of Care [facility]) CM/SW [place], [place] Phone Number: 05/11/2022, 1:45 PM  Clinical Narrative:     Update: [facility] has accepted, will need covid vaccination card and HTA authorization.     CSW met with patient and family at bedside, reviewed offers for SNF bed they report wanting [facility] as first choice, if [facility] declines then they will accept the bed offer already received from [facility].   CSW has reached out to [facility] pending response.    Expected Discharge Plan:  (TBD) Barriers to Discharge: Continued Medical Work up  Expected Discharge Plan and Services Expected Discharge Plan:  (TBD)       Living arrangements for the past 2 months: Single Family Home                                       Social Determinants of Health (SDOH) Interventions    Readmission Risk Interventions     View : No data to display.

## 2022-05-11 NOTE — Consult Note (Signed)
ANTICOAGULATION CONSULT NOTE   Pharmacy Consult for IV Heparin Indication: atrial fibrillation  Patient Measurements: Height: '5\' 7"'$  (170.2 cm) Weight: 101.5 kg (223 lb 12.3 oz) IBW/kg (Calculated) : 66.1 Heparin Dosing Weight: 87.6 kg  Labs: Recent Labs    05/08/22 0400 05/09/22 0529 05/09/22 0921 05/09/22 1759 05/10/22 0451 05/10/22 1431 05/10/22 2157  HGB 14.7 16.1  --   --  16.1  --   --   HCT 49.0 51.6  --   --  52.7*  --   --   PLT 135* 150  --   --  165  --   --   APTT  --   --  28   < > 50* 82* 76*  LABPROT  --   --  16.1*  --   --   --   --   INR  --   --  1.3*  --   --   --   --   HEPARINUNFRC  --   --  >1.10*  --  0.57 0.90*  --   CREATININE 0.90 0.70  --   --   --   --   --    < > = values in this interval not displayed.    Estimated Creatinine Clearance: 90.6 mL/min (by C-G formula based on SCr of 0.7 mg/dL).  Medical History: Past Medical History:  Diagnosis Date   Arthritis    COPD (chronic obstructive pulmonary disease) ([place])    Diabetes mellitus without complication (HCC)    diet controlled   Dyspnea    with activity   Hyperlipidemia    Hypertension    Myasthenia gravis ([place])    Oxygen deficit    Ventricular tachycardia (HCC)     Assessment: 76 y.o. male with chronic systolic and diastolic heart failure, PAF, COPD, myasthenia gravis admitted with PEA arrest in the setting of acute on chronic systolic diastolic heart failure and hypoxic respiratory failure in the setting of not taking his diuretics.Pharmacy consulted to start heparin for afib. Last dose apixaban 5/29 21:57. Will use aPTT for monitoring.   Date Time aPTT/HL Rate/Comment 5/30 0921 28s / >1.1 Baselines labs ; 1200 un/hr (started 10am) 5/30 1800 36s  Subtherapeutic; 1200 > 1500 un/hr   5/31 0451 50s/0.57 aPTT subtherapeutic 5/31 1431 83s/0.90 aPTT therapeutic x 1 5/31 2157 aPTT 76  Baseline Labs: aPTT - 28s; INR - 1.3 Hgb - 14.7>16.1; Plts - 135>150  Goal of Therapy:   Heparin level 0.3-0.7 units/ml once heparin level and aPTT correlates.  aPTT 66-102 seconds Monitor platelets by anticoagulation protocol: Yes   Plan:  --aPTT is therapeutic x 2 --Continue heparin infusion at 1700 units/hr --Check daily with AM labs --Daily HL & CBC while on IV heparin  Renda Rolls, PharmD, [facility] 05/11/2022 12:30 AM

## 2022-05-11 NOTE — Progress Notes (Signed)
Daily Progress Note   Patient Name: Joseph Macdonald       Date: 05/11/2022 DOB: January 06, 1946  Age: 76 y.o. MRN#: [id number] Attending Physician: Annita Brod, MD Primary Care Physician: [name], MD Admit Date: 04/30/2022 Length of Stay: 11 days  Reason for Consultation/Follow-up: Establishing goals of care  HPI/Patient Profile:  76 y.o. male with past medical history of A-fib on Eliquis, COPD, combined systolic and diastolic CHF, H0WC, HLD, HTN, myasthenia gravis, V. tach.  He presented to the [facility] emergency department in severe respiratory distress and "purple" and before placed on telemetry lost pulses.  He received CPR for 8 minutes with no defibrillations and was emergently intubated for mechanical ventilatory support.  He was admitted on 04/30/2022 with PEA arrest (suspected pulmonary etiology), circulatory shock, acute on chronic combined systolic and diastolic CHF exacerbation, acute hypoxic respiratory failure, AKI in the setting of cardiac arrest, and others.    He was briefly extubated after MRI without evidence of anoxic brain injury but required emergent reintubation shortly thereafter due to left lung white-out from mucus plug s/p bronch removal. Overall prognosis poor.   PMT was consulted for [place] conversations.  Subjective:   Subjective: Chart Reviewed. Updates received. Patient Assessed. Created space and opportunity for patient  and family to explore thoughts and feelings regarding current medical situation.  Today's Discussion: Today the nurse requested PMT to revisit patient due to family/patient request to review plan of care and ensure this is consistent with patient's wishes. I met with the patient at the bedside, his daughter and wife were both present.  We discussed the patient's continued improvement.  We discussed current treatment plan of continued medical treatment, likely SNF/rehab at d/c, consideration of right and left heart cath to evaluate  possible etiology (question ischemic) of significant decline in EF down to 35%.  We discussed how even if there was a blockage and this is stented there is not likely to be much improvement in his heart failure.  I inquired about the nurses assertion that the patient has stated he is "tired".  I asked him to further expound on this.  He states that the tired he is referred to is related to fatigue and weakness.  He denies being "tired of doing continue treatments."  We discussed her multiple likely explanations for his fatigue including mild myasthenia flare, abrupt decline in EF, recent critical illness, bedbound status, etc.  He verbalized understanding and agreement with all of this.  We further reviewed that the patient is still a DNR.  I explained what resuscitation entails and whether he would want to remain a DNR or if he has had a change in his thoughts.  After discussing with his wife a little bit he affirmed that he would wishes to remain a DNR at this time.  Otherwise, he is on board with continuing full scope of offered treatments.  I recommended outpatient palliative care to follow-up after discharge and explained the philosophy of outpatient palliative care and continuing light check-in, supporting goals discussions as needed, supporting patient and family through chronic illness.  They verbalized agreement to this.  For the end of the visit he also mentions difficulty/weakness with chewing.  While he was eating in the room I did note several episodes of coughing.  I know he has a history of aspiration pneumonia.  I will discuss with the hospitalist about possible SLP evaluation for any significant dysphagia.  I provided emotional and general response to therapeutic listening,  empathy, sharing of stories, therapeutic touch, and other techniques.  I answered all questions and addressed all concerns to the best of my ability.  Review of Systems  Constitutional:  Positive for fatigue.   Respiratory:  Positive for shortness of breath (Specifically with activity; improving).   Gastrointestinal:  Negative for nausea and vomiting.  Neurological:  Positive for weakness.   Objective:   Vital Signs:  BP 129/84 (BP Location: Left Arm)   Pulse (!) 104   Temp 98.2 F (36.8 C)   Resp 16   Ht _0  (1.702 m)   Wt 101.3 kg   SpO2 95%   BMI 34.98 kg/m   Physical Exam: Physical Exam Vitals and nursing note reviewed.  Constitutional:      General: He is not in acute distress.    Appearance: He is ill-appearing.  HENT:     Head: Normocephalic and atraumatic.  Cardiovascular:     Rate and Rhythm: Normal rate.  Pulmonary:     Effort: Pulmonary effort is normal. No respiratory distress.  Abdominal:     General: Abdomen is protuberant. Bowel sounds are normal.     Comments: Abdomen is firm but not tense  Skin:    General: Skin is warm and dry.  Neurological:     General: No focal deficit present.  Psychiatric:        Mood and Affect: Mood normal.        Behavior: Behavior normal.     Comments: Appears anxious    Palliative Assessment/Data: 50%   Assessment & Plan:   Impression: Present on Admission:  Cardiac arrest due to respiratory disorder (HCC)  Paroxysmal atrial fibrillation (HCC)  Hypertensive emergency  Pressure injury of skin  Acute on chronic HFrEF (heart failure with reduced ejection fraction) (HCC)  Morbid obesity (HCC)  Acute respiratory failure with hypoxia (HCC)  COPD (chronic obstructive pulmonary disease) (HCC)  Myasthenia gravis ([place])  76 year old male with multiple chronic comorbidities presents with acute respiratory distress, chest pain, and subsequent cardiac arrest.  ROSC returned after 8 minutes of CPR.  He was previously extubated and required reintubation soon thereafter.  Noted mucous plug cleared on bronchoscopy after reintubation.  He was again extubated and seems to be doing better, is now on 2 L nasal cannula.  Plans to transfer  to the floor today. His echocardiogram also shows significant worsening over the past month, query cardiac etiology such as cardiac ischemia. They share that overall he has not had a great quality of life as of late and he would not want a worsening quality of life.  MRI postcardiac arrest with no evidence of anoxic brain injury.  He seems to be doing better since the weekend and still doing well today. He desires full scope of treatment. Overall prognosis remains guarded to poor.   SUMMARY OF RECOMMENDATIONS   Remain DNR Continue to treat the treatable Continued emotional support of the patient and family Goals are clear at this point, desires full scope of care Consider SLP re-evaluation for dysphagia/increased aspiration risk PMT will follow peripherally Please notify us of any needs or changes requiring our re-involvement  Symptom Management:  Per primary team PMT is available to assist as needed, especially if the patient transitions to comfort care at some point  Code Status: Limited code  Prognosis: Unable to determine  Discharge Planning: To Be Determined  Discussed with: Medical team, nursing team, patient's family  Thank you for allowing [place] to participate in the care of  Joseph Macdonald PMT will continue to support holistically.  Billing based on MDM: High  Problems Addressed: One acute or chronic illness or injury that poses a threat to life or bodily function  Amount and/or Complexity of Data: Category 3:Discussion of management or test interpretation with external physician/other qualified health care professional/appropriate source (not separately reported)  Risks: Decision not to resuscitate or to de-escalate care because of poor prognosis   [name], NP Palliative Medicine Team  Team Phone # [phone] (Nights/Weekends)  08/09/2021, 8:17 AM

## 2022-05-11 NOTE — Progress Notes (Signed)
Progress Note  Patient Name: Joseph Macdonald Date of Encounter: 05/11/2022  [facility] Cardiologist: [name], MD   Subjective   Patient seen on AM rounds. Endorses chest tenderness since working with physical therapy, stating " think I pulled a muscle". Endorses worsening shortness of breath that started last evening. Remains on oxygen which he did not require prior to admission. -3.2 L Received IVIG yesterday. Dosing every 24 hours times 5 doses.   Inpatient Medications    Scheduled Meds:  chlorhexidine  15 mL Mouth Rinse BID   digoxin  0.125 mg Oral Daily   feeding supplement  237 mL Oral BID BM   insulin aspart  0-20 Units Subcutaneous TID WC & HS   ipratropium-albuterol  3 mL Nebulization BID   losartan  12.5 mg Oral Daily   mouth rinse  15 mL Mouth Rinse q12n4p   multivitamin with minerals  1 tablet Oral Daily   OLANZapine zydis  5 mg Oral BID   pantoprazole  40 mg Oral Daily   pyridostigmine  30 mg Oral TID   sodium chloride flush  3 mL Intravenous Q12H   Continuous Infusions:  sodium chloride Stopped (05/09/22 1434)   heparin 1,450 Units/hr (05/11/22 0859)   Immune Globulin 10% 240 mL/hr at 05/11/22 0319   piperacillin-tazobactam (ZOSYN)  IV 3.375 g (05/11/22 0534)   PRN Meds: sodium chloride, atropine, diazepam, hydrALAZINE, HYDROcodone-acetaminophen, polyethylene glycol, sodium chloride flush   Vital Signs    Vitals:   05/11/22 0200 05/11/22 0255 05/11/22 0536 05/11/22 0835  BP: 117/69 131/82 (!) 145/74 (!) 159/89  Pulse: 73 75 82 93  Resp: 20 (!) '23 13 17  '$ Temp: 98.7 F (37.1 C) 98.7 F (37.1 C) 97.7 F (36.5 C) 98.4 F (36.9 C)  TempSrc: Oral Oral Axillary   SpO2: 97% 96% 95% 97%  Weight:  101.3 kg    Height:        Intake/Output Summary (Last 24 hours) at 05/11/2022 1000 Last data filed at 05/11/2022 0322 Gross per 24 hour  Intake 1690.36 ml  Output 4850 ml  Net -3159.64 ml      05/11/2022    2:55 AM 05/11/2022   12:00 AM 05/10/2022     5:00 AM  Last 3 Weights  Weight (lbs) 223 lb 5.2 oz 223 lb 12.3 oz 223 lb 12.3 oz  Weight (kg) 101.3 kg 101.5 kg 101.5 kg      Telemetry    SR with multifocal PVC's and large amount of artifact- Personally Reviewed  ECG    No new tracings- Personally Reviewed  Physical Exam   GEN: No acute distress. Sitting upright in bed. Took several attempts to arouse this morning  Neck: No JVD, no carotid bruits Cardiac: S1-S2, RRR, no murmurs, rubs, or gallops.  Respiratory: Scattered rhonchi noted throughout with diminished bases bilaterally to auscultation bilaterally. Respirations are mildly labored at rest with talking currently on 2L O2 via [place] GI: Soft, nontender, non-distended, obese, bowel sounds present in all 4 quadrants MS: 1+ edema to bilateral lower extremities; No deformity. Neuro:  Arouses to tactile stimuli, oriented to person and place Psych: Normal affect   Labs    High Sensitivity Troponin:   Recent Labs  Lab 04/30/22 0332 04/30/22 0546 04/30/22 1734 04/30/22 1955  TROPONINIHS 373* 1,137* 2,844* 2,210*     Chemistry Recent Labs  Lab 05/06/22 0544 05/07/22 0314 05/08/22 0400 05/09/22 0529 05/11/22 0706  NA 147* 147* 140 142 139  K 4.0 4.5 4.2  3.9 4.0  CL 109 108 103 103 101  CO2 '30 31 29 31 '$ 34*  GLUCOSE 159* 149* 137* 157* 113*  BUN 55* 41* 32* 23 19  CREATININE 1.13 1.03 0.90 0.70 0.88  CALCIUM 8.1* 8.0* 8.0* 8.3* 8.2*  MG 2.6* 2.3 2.1  --   --   GFRNONAA >60 >60 >60 >60 >60  ANIONGAP '8 8 8 8 '$ 4*    Lipids No results for input(s): CHOL, TRIG, HDL, LABVLDL, LDLCALC, CHOLHDL in the last 168 hours.  Hematology Recent Labs  Lab 05/09/22 0529 05/10/22 0451 05/11/22 0706  WBC 12.1* 14.8* 10.9*  RBC 5.42 5.44 5.41  HGB 16.1 16.1 16.2  HCT 51.6 52.7* 52.8*  MCV 95.2 96.9 97.6  MCH 29.7 29.6 29.9  MCHC 31.2 30.6 30.7  RDW 13.4 13.4 13.6  PLT 150 165 176   Thyroid No results for input(s): TSH, FREET4 in the last 168 hours.  BNPNo results for  input(s): BNP, PROBNP in the last 168 hours.  DDimer No results for input(s): DDIMER in the last 168 hours.   Radiology    No results found.  Cardiac Studies  Echocardiogram completed on 04/30/2022 1. Left ventricular ejection fraction, by estimation, is 35 to 40%. The  left ventricle has moderately decreased function. The left ventricle  demonstrates global hypokinesis. There is mild left ventricular  hypertrophy. Left ventricular diastolic  parameters are consistent with Grade I diastolic dysfunction (impaired  relaxation).   2. Right ventricular systolic function is moderately reduced. The right  ventricular size is mildly enlarged.   3. The mitral valve is normal in structure. No evidence of mitral valve  regurgitation.   4. The aortic valve is grossly normal. Aortic valve regurgitation is not  visualized.   Echocardiogram completed 04/20/2022 1. Challenging study, PVCs noted.   2. Left ventricular ejection fraction, by estimation, is 45 to 50%. The  left ventricle has mildly decreased function. The left ventricle has no  regional wall motion abnormalities. There is mild left ventricular  hypertrophy. Left ventricular diastolic  parameters are consistent with Grade I diastolic dysfunction (impaired  relaxation).   3. Right ventricular systolic function is mildly reduced. The right  ventricular size is normal. Tricuspid regurgitation signal is inadequate  for assessing PA pressure.   4. The mitral valve is normal in structure. Mild to moderate mitral valve  regurgitation. No evidence of mitral stenosis.   5. The aortic valve is normal in structure. Aortic valve regurgitation is  mild. No aortic stenosis is present.   6. There is borderline dilatation of the aortic root, measuring 37 mm.  There is mild dilatation of the ascending aorta, measuring 42 mm.   7. The inferior vena cava is normal in size with greater than 50%  respiratory variability, suggesting right atrial  pressure of 3 mmHg.   Patient Profile     76 y.o. male with a history of chronic systolic and diastolic heart failure, PAF, COPD, myasthenia gravis admitted with PEA arrest in the setting of acute on chronic systolic diastolic heart failure and hypoxic respiratory failure in the setting of not taking his diuretics.  Hospitalization was complicated by acute on chronic renal failure and possible myasthenia gravis flare.  Assessment & Plan    Acute on chronic combined systolic and diastolic CHF - EF 35 to 17% by echo down from 45-50% previously - Recommend diuretic 40 mg IV BID starting today - Continue losartan 12.5 mg daily - Daily weights, strict I & O -  Consider R/LHC once continued with improvement, possible beginning of next week - Daily BMP  2. Paroxysmal atrial fibrillation - Currently in SR with PVC's - Continue heparin drip - Continue digoxin - Avoiding beta blockers due to MG, holding flecainide and calcium channel blockers due to low EF  3. S/p PEA arrest - in the setting of decompensated heart failure and respiratory failure - will need ischemic work-up pending improving and progression  4. Hypertensive emergency - Blood pressure better controlled - Continue losartan 12.5 mg daily - Vital signs per unit protocol - Avoid beta blockers in the setting of myasthenia gravis  5. Myasthenia gravis - Recommendations per neuro  For questions or updates, please contact [place] Please consult [email or web address] for contact info under        Signed, Jasslyn Finkel, NP  05/11/2022, 10:00 AM

## 2022-05-11 NOTE — Consult Note (Signed)
[place] for IV Heparin Indication: atrial fibrillation  Patient Measurements: Height: '5\' 7"'$  (170.2 cm) Weight: 101.3 kg (223 lb 5.2 oz) IBW/kg (Calculated) : 66.1 Heparin Dosing Weight: 87.6 kg  Labs: Recent Labs     0000 05/09/22 0529 05/09/22 0921 05/09/22 1759 05/10/22 0451 05/10/22 1431 05/10/22 2157 05/11/22 0706  HGB  --  16.1  --   --  16.1  --   --  16.2  HCT  --  51.6  --   --  52.7*  --   --  52.8*  PLT  --  150  --   --  165  --   --  176  APTT  --   --  28   < > 50* 82* 76* 103*  LABPROT  --   --  16.1*  --   --   --   --   --   INR  --   --  1.3*  --   --   --   --   --   HEPARINUNFRC   < >  --  >1.10*  --  0.57 0.90*  --  0.96*  CREATININE  --  0.70  --   --   --   --   --  0.88   < > = values in this interval not displayed.    Estimated Creatinine Clearance: 82.3 mL/min (by C-G formula based on SCr of 0.88 mg/dL).  Medical History: Past Medical History:  Diagnosis Date   Arthritis    COPD (chronic obstructive pulmonary disease) ([place])    Diabetes mellitus without complication (HCC)    diet controlled   Dyspnea    with activity   Hyperlipidemia    Hypertension    Myasthenia gravis ([place])    Oxygen deficit    Ventricular tachycardia (HCC)     Assessment: 76 y.o. male with chronic systolic and diastolic heart failure, PAF, COPD, myasthenia gravis admitted with PEA arrest in the setting of acute on chronic systolic diastolic heart failure and hypoxic respiratory failure in the setting of not taking his diuretics.Pharmacy consulted to start heparin for afib. Last dose apixaban 5/29 21:57. Will use aPTT for monitoring.   Date Time aPTT/HL Rate/Comment 5/30 0921 28s / >1.1 Baselines labs ; 1200 un/hr (started 10am) 5/30 1800 36s  Subtherapeutic; 1200 > 1500 un/hr   5/31 0451 50s/0.57 aPTT subtherapeutic 5/31 1431 83s/0.90 aPTT therapeutic x 1 5/31 2157 aPTT 76 6/01 0706 103/0.96 Supratherapeutic.   Goal of  Therapy:  Heparin level 0.3-0.7 units/ml once heparin level and aPTT correlates.  aPTT 66-102 seconds Monitor platelets by anticoagulation protocol: Yes   Plan:  Heparin level and aPTT seem to be correlating now, both supratherapeutic. Will decrease heparin infusion to 1450 units/hr. Recheck heparin level in 8 hours. CBC daily while on heparin.    Eleonore Chiquito, PharmD, 05/11/2022 8:39 AM

## 2022-05-11 NOTE — Consult Note (Signed)
[place] for IV Heparin Indication: atrial fibrillation  Patient Measurements: Height: '5\' 7"'$  (170.2 cm) Weight: 101.3 kg (223 lb 5.2 oz) IBW/kg (Calculated) : 66.1 Heparin Dosing Weight: 87.6 kg  Labs: Recent Labs     0000 05/09/22 0529 05/09/22 0921 05/09/22 1759 05/10/22 0451 05/10/22 1431 05/10/22 2157 05/11/22 0706 05/11/22 1645  HGB  --  16.1  --   --  16.1  --   --  16.2  --   HCT  --  51.6  --   --  52.7*  --   --  52.8*  --   PLT  --  150  --   --  165  --   --  176  --   APTT  --   --  28   < > 50* 82* 76* 103*  --   LABPROT  --   --  16.1*  --   --   --   --   --   --   INR  --   --  1.3*  --   --   --   --   --   --   HEPARINUNFRC   < >  --  >1.10*  --  0.57 0.90*  --  0.96* 0.49  CREATININE  --  0.70  --   --   --   --   --  0.88  --    < > = values in this interval not displayed.    Estimated Creatinine Clearance: 82.3 mL/min (by C-G formula based on SCr of 0.88 mg/dL).  Medical History: Past Medical History:  Diagnosis Date   Arthritis    COPD (chronic obstructive pulmonary disease) ([place])    Diabetes mellitus without complication (HCC)    diet controlled   Dyspnea    with activity   Hyperlipidemia    Hypertension    Myasthenia gravis ([place])    Oxygen deficit    Ventricular tachycardia (HCC)     Assessment: 76 y.o. male with chronic systolic and diastolic heart failure, PAF, COPD, myasthenia gravis admitted with PEA arrest in the setting of acute on chronic systolic diastolic heart failure and hypoxic respiratory failure in the setting of not taking his diuretics.Pharmacy consulted to start heparin for afib. Last dose apixaban 5/29 21:57. Will use aPTT for monitoring.   Date Time aPTT/HL Rate/Comment 5/30 0921 28s / >1.1 Baselines labs ; 1200 un/hr (started 10am) 5/30 1800 36s  Subtherapeutic; 1200 > 1500 un/hr   5/31 0451 50s/0.57 aPTT subtherapeutic 5/31 1431 83s/0.90 aPTT therapeutic x 1 5/31 2157 aPTT  76 6/01 0706 103/0.96 Supratherapeutic 6/02 1645 N/A / 0.49 Therapeutic x 1  Goal of Therapy:  Heparin level 0.3-0.7 units/mL Monitor platelets by anticoagulation protocol: Yes   Plan:  --Heparin level therapeutic x 1 --Continue heparin infusion at 1450 units/hr --Confirmatory HL in 8 hours --Daily CBC per protocol  [name]  05/11/2022 5:22 PM

## 2022-05-11 NOTE — Progress Notes (Signed)
PT Cancellation Note  Patient Details Name: Joseph Macdonald MRN: [id number] DOB: September 18, 1946   Cancelled Treatment:     PT attempted 2 x this date. Pt endorsing having more sternal/chest pain today and is visibly more fatigued. Struggles to keep eyes open at times. Will hold PT today and return tomorrow.    Willette Pa 05/11/2022, 1:42 PM

## 2022-05-11 NOTE — Progress Notes (Signed)
[facility] Progress Note  Patient: Joseph Macdonald    DJS:[id number]  DOA: 04/30/2022    Date of Service: the patient was seen and examined on 05/11/2022  Brief hospital course: 76 year old male with past medical history of morbid obesity, myasthenia gravis, COPD, systolic/diastolic CHF, diabetes mellitus and atrial fibrillation on Eliquis presented to the emergency room on 5/21 with hypoxia requiring nonrebreather and then suffered a cardiac arrest upon arrival to the emergency room.  Patient able to be resuscitated.  CT scan revealed aspiration pneumonia.  Patient admitted to the critical care service and placed on ventilator support.  Initially extubated 5/24 but reintubated same day.  Underwent bronchoscopy removing bloody mucous plugs.  Able to be extubated on 5/27.  Patient transferred out of ICU on 5/29.  Cardiology, neurology and palliative care have been following as well.  Patient also looks to be having an acute flareup of his myasthenia gravis and was started on IVIG as of 5/31.  Assessment and Plan: Assessment and Plan: * Cardiac arrest due to respiratory disorder [facility]) Patient with chest wall pain.  No evidence of fractures on x-ray.  Pain control.  Acute respiratory failure with hypoxia (HCC) In the setting of heart failure exacerbation as well as pneumonia.  Status post ventilator support.  Continue steroids and antibiotics and nebulizers.  Pulmonary following.  Improved.  Acute on chronic HFrEF (heart failure with reduced ejection fraction) [facility]) Cardiology following.  Echocardiogram done 5/21 notes ejection fraction of 35 to 40% and grade 1 diastolic dysfunction as well as moderately reduced right ventricular systolic function.  Initially held further diuresis in the setting of hyponatremia.  No beta-blockers given myasthenia gravis.  Restarted on IV Lasix twice daily which will be stopped following completion of IVIG.  Plan is for cardiac catheterization next week which  after discussion with patient, he is amenable to.  Myasthenia gravis [facility]) Neurology following.  Looks to have a mild myasthenia exacerbation and started on IVIG 5/31.  Continue Mestinon.  Hypertensive emergency Blood pressures after extubation were in the 885O systolic.  Weaned off of nitroglycerin drip.  Avoiding beta-blockers.  Added losartan.  Now started on diuretics  Paroxysmal atrial fibrillation (HCC) Currently in sinus rhythm.  If needed, amiodarone or digoxin.  Currently on heparin drip.  Holding Eliquis.  Pressure injury of skin Pressure Injury Buttocks Right Stage 2 -  Partial thickness loss of dermis presenting as a shallow open injury with a red, pink wound bed without slough. (Active)     Location: Buttocks  Location Orientation: Right  Staging: Stage 2 -  Partial thickness loss of dermis presenting as a shallow open injury with a red, pink wound bed without slough.  Wound Description (Comments):   Present on Admission: No   Stage II right buttock ulcer, not present on admission.  Wound care.   Morbid obesity ([place]) Meets criteria BMI greater than 30 and comorbidities of heart failure and hypertension       Body mass index is 34.98 kg/m.  Nutrition Problem: Inadequate oral intake Etiology: inability to eat Pressure Injury Buttocks Right Stage 2 -  Partial thickness loss of dermis presenting as a shallow open injury with a red, pink wound bed without slough. (Active)     Location: Buttocks  Location Orientation: Right  Staging: Stage 2 -  Partial thickness loss of dermis presenting as a shallow open injury with a red, pink wound bed without slough.  Wound Description (Comments):   Present on Admission: No  Dressing  Type Foam - Lift dressing to assess site every shift 05/10/22 2000     Consultants: Neurology Critical care Cardiology Palliative care  Procedures: Echocardiogram done 5/21: Ejection fraction of 35 to 13%, grade 1 diastolic dysfunction.   Moderately reduced right ventricular systolic function Plan cardiac catheterization next week  Antimicrobials: Zithromax 5/21 - 5/22 IV Unasyn 5/21 IV Zosyn 5/26-present    Code Status: Now DNR   Subjective: Patient complains of chest wall pain.  Also quite fatigued  Objective: Vital signs were reviewed and unremarkable. Vitals:   05/11/22 1213 05/11/22 1627  BP: 129/84 (!) 133/57  Pulse: (!) 104 (!) 51  Resp: 16 18  Temp: 98.2 F (36.8 C) 98 F (36.7 C)  SpO2: 95% 95%    Intake/Output Summary (Last 24 hours) at 05/11/2022 1739 Last data filed at 05/11/2022 1517 Gross per 24 hour  Intake 1405.87 ml  Output 6175 ml  Net -4769.13 ml    Filed Weights   05/10/22 0500 05/11/22 0000 05/11/22 0255  Weight: 101.5 kg 101.5 kg 101.3 kg   Body mass index is 34.98 kg/m.  Exam:  General: Alert and oriented x2, no acute distress HEENT: Normocephalic, atraumatic, mucous membranes are slightly dry.  Narrow airway Cardiovascular: Regular rate and rhythm, Y8-M5, 2/6 systolic ejection murmur Respiratory: Decreased breath sounds throughout Abdomen: Soft, obese, nontender, positive bowel sounds Musculoskeletal: Noted chest wall tenderness.  No clubbing or cyanosis, trace pitting edema Skin: No skin breaks, tears or lesions Psychiatry: Appropriate, no evidence of psychoses  Data Reviewed: There are no new results to review at this time.  Disposition:  Status is: Inpatient Remains inpatient appropriate because: Stabilization of respiratory function and myasthenia exacerbation.  Planned cardiac cath    Anticipated discharge date: 6/5 Family Communication: Left message for wife DVT Prophylaxis: SCDs Start: 04/30/22 0350    Author: Annita Brod ,MD 05/11/2022 5:39 PM  To reach On-call, see care teams to locate the attending and reach out via [email or web address]. Between 7PM-7AM, please contact night-coverage If you still have difficulty reaching the attending provider,  please page the [facility] (Director on Call) for [facility] on amion for assistance.

## 2022-05-12 DIAGNOSIS — Z7189 Other specified counseling: Secondary | ICD-10-CM | POA: Diagnosis not present

## 2022-05-12 DIAGNOSIS — I5023 Acute on chronic systolic (congestive) heart failure: Secondary | ICD-10-CM | POA: Diagnosis not present

## 2022-05-12 DIAGNOSIS — Z66 Do not resuscitate: Secondary | ICD-10-CM | POA: Diagnosis not present

## 2022-05-12 DIAGNOSIS — I468 Cardiac arrest due to other underlying condition: Secondary | ICD-10-CM | POA: Diagnosis not present

## 2022-05-12 DIAGNOSIS — J989 Respiratory disorder, unspecified: Secondary | ICD-10-CM | POA: Diagnosis not present

## 2022-05-12 DIAGNOSIS — Z515 Encounter for palliative care: Secondary | ICD-10-CM | POA: Diagnosis not present

## 2022-05-12 LAB — GLUCOSE, CAPILLARY
Glucose-Capillary: 108 mg/dL — ABNORMAL HIGH (ref 70–99)
Glucose-Capillary: 176 mg/dL — ABNORMAL HIGH (ref 70–99)
Glucose-Capillary: 161 mg/dL — ABNORMAL HIGH (ref 70–99)
Glucose-Capillary: 130 mg/dL — ABNORMAL HIGH (ref 70–99)

## 2022-05-12 LAB — CBC
HCT: 51.2 % (ref 39.0–52.0)
Hemoglobin: 15.1 g/dL (ref 13.0–17.0)
MCH: 29 pg (ref 26.0–34.0)
MCHC: 29.5 g/dL — ABNORMAL LOW (ref 30.0–36.0)
MCV: 98.5 fL (ref 80.0–100.0)
Platelets: 143 10*3/uL — ABNORMAL LOW (ref 150–400)
RBC: 5.2 MIL/uL (ref 4.22–5.81)
RDW: 13.3 % (ref 11.5–15.5)
WBC: 9 10*3/uL (ref 4.0–10.5)
nRBC: 0 % (ref 0.0–0.2)

## 2022-05-12 LAB — BASIC METABOLIC PANEL
Anion gap: 7 (ref 5–15)
BUN: 19 mg/dL (ref 8–23)
CO2: 28 mmol/L (ref 22–32)
Calcium: 7.6 mg/dL — ABNORMAL LOW (ref 8.9–10.3)
Chloride: 100 mmol/L (ref 98–111)
Creatinine, Ser: 0.81 mg/dL (ref 0.61–1.24)
GFR, Estimated: >60 mL/min (ref >60)
Glucose, Bld: 119 mg/dL — ABNORMAL HIGH (ref 70–99)
Potassium: 3.5 mmol/L (ref 3.5–5.1)
Sodium: 135 mmol/L (ref 135–145)

## 2022-05-12 LAB — HEPARIN LEVEL (UNFRACTIONATED): Heparin Unfractionated: 0.61 IU/mL (ref 0.30–0.70)

## 2022-05-12 MED ORDER — LOSARTAN POTASSIUM 25 MG PO TABS
25.0000 mg | ORAL_TABLET | Freq: Every day | ORAL | Status: DC
  Administered 2022-05-13 – 2022-05-18 (×6): 25 mg via ORAL
  Filled 2022-05-12 (×6): qty 1

## 2022-05-12 MED ORDER — NYSTATIN 100000 UNIT/GM EX OINT
TOPICAL_OINTMENT | Freq: Two times a day (BID) | CUTANEOUS | Status: DC
  Administered 2022-05-12 – 2022-05-17 (×4): 1 via TOPICAL
  Filled 2022-05-12: qty 15

## 2022-05-12 NOTE — Progress Notes (Signed)
Progress Note  Patient Name: Joseph Macdonald Date of Encounter: 05/12/2022  [facility] Cardiologist: [name], MD   Subjective   Patient seen on AM rounds. Sitting upright in bed eating breakfast. Mush more alert and talkative this morning. Denies any chest pain. Endorses some shortness of breath and a congested nonproductive cough. Fluid balance +1L. Remains on 2L of O2 via [place].  Inpatient Medications    Scheduled Meds:  chlorhexidine  15 mL Mouth Rinse BID   digoxin  0.125 mg Oral Daily   feeding supplement  237 mL Oral BID BM   furosemide  40 mg Intravenous BID   insulin aspart  0-20 Units Subcutaneous TID WC & HS   ipratropium-albuterol  3 mL Nebulization BID   losartan  12.5 mg Oral Daily   mouth rinse  15 mL Mouth Rinse q12n4p   multivitamin with minerals  1 tablet Oral Daily   OLANZapine zydis  5 mg Oral BID   pantoprazole  40 mg Oral Daily   pyridostigmine  30 mg Oral TID   sodium chloride flush  3 mL Intravenous Q12H   Continuous Infusions:  sodium chloride Stopped (05/09/22 1434)   heparin 1,450 Units/hr (05/12/22 0800)   Immune Globulin 10% Stopped (05/12/22 0210)   PRN Meds: sodium chloride, atropine, diazepam, hydrALAZINE, HYDROcodone-acetaminophen, polyethylene glycol, sodium chloride flush   Vital Signs    Vitals:   05/11/22 2337 05/12/22 0050 05/12/22 0420 05/12/22 0800  BP: 137/74 135/74 (!) 156/80 (!) 158/84  Pulse: 98 97 96 (!) 115  Resp: 19 20 (!) 21 (!) 28  Temp: 98.2 F (36.8 C) 98 F (36.7 C) 98 F (36.7 C) 97.8 F (36.6 C)  TempSrc: Oral Oral Oral Oral  SpO2: 97% 96% 94% 93%  Weight:   103.2 kg   Height:        Intake/Output Summary (Last 24 hours) at 05/12/2022 1013 Last data filed at 05/12/2022 0943 Gross per 24 hour  Intake 5637.99 ml  Output 4025 ml  Net 1612.99 ml      05/12/2022    4:20 AM 05/11/2022    2:55 AM 05/11/2022   12:00 AM  Last 3 Weights  Weight (lbs) 227 lb 8.2 oz 223 lb 5.2 oz 223 lb 12.3 oz  Weight (kg)  103.2 kg 101.3 kg 101.5 kg      Telemetry    SR- ST with PVC's rates of 90-100's with artifact- Personally Reviewed  ECG    No new tracings - Personally Reviewed  Physical Exam   GEN: No acute distress but ill appearing Neck: + JVD Cardiac: RRR, no murmurs, rubs, or gallops.  Respiratory: Scattered rhonchi with diminished bases to auscultation bilaterally. Respirations are unlabored at rest but he does become winded with talking for extended periods of time. Remains on 2L O2 via [place] GI: Soft, nontender, mildly distended, bowel sounds positive in all 4 quadrants MS: 1+ pitting edema to bilateral lower extremities; No deformity. Neuro:  Nonfocal  Psych: Normal affect   Labs    High Sensitivity Troponin:   Recent Labs  Lab 04/30/22 0332 04/30/22 0546 04/30/22 1734 04/30/22 1955  TROPONINIHS 373* 1,137* 2,844* 2,210*     Chemistry Recent Labs  Lab 05/06/22 0544 05/07/22 0314 05/08/22 0400 05/09/22 0529 05/11/22 0706 05/12/22 0746  NA 147* 147* 140 142 139 135  K 4.0 4.5 4.2 3.9 4.0 3.5  CL 109 108 103 103 101 100  CO2 '30 31 29 31 '$ 34* 28  GLUCOSE 159*  149* 137* 157* 113* 119*  BUN 55* 41* 32* '23 19 19  '$ CREATININE 1.13 1.03 0.90 0.70 0.88 0.81  CALCIUM 8.1* 8.0* 8.0* 8.3* 8.2* 7.6*  MG 2.6* 2.3 2.1  --   --   --   GFRNONAA >60 >60 >60 >60 >60 >60  ANIONGAP '8 8 8 8 '$ 4* 7    Lipids No results for input(s): CHOL, TRIG, HDL, LABVLDL, LDLCALC, CHOLHDL in the last 168 hours.  Hematology Recent Labs  Lab 05/10/22 0451 05/11/22 0706 05/12/22 0746  WBC 14.8* 10.9* 9.0  RBC 5.44 5.41 5.20  HGB 16.1 16.2 15.1  HCT 52.7* 52.8* 51.2  MCV 96.9 97.6 98.5  MCH 29.6 29.9 29.0  MCHC 30.6 30.7 29.5*  RDW 13.4 13.6 13.3  PLT 165 176 143*   Thyroid No results for input(s): TSH, FREET4 in the last 168 hours.  BNPNo results for input(s): BNP, PROBNP in the last 168 hours.  DDimer No results for input(s): DDIMER in the last 168 hours.   Radiology    No results  found.  Cardiac Studies  Echocardiogram completed on 04/30/2022  1. Left ventricular ejection fraction, by estimation, is 35 to 40%. The  left ventricle has moderately decreased function. The left ventricle  demonstrates global hypokinesis. There is mild left ventricular  hypertrophy. Left ventricular diastolic  parameters are consistent with Grade I diastolic dysfunction (impaired  relaxation).   2. Right ventricular systolic function is moderately reduced. The right  ventricular size is mildly enlarged.   3. The mitral valve is normal in structure. No evidence of mitral valve  regurgitation.   4. The aortic valve is grossly normal. Aortic valve regurgitation is not  visualized.   Echocardiogram completed on 04/20/2022 1. Challenging study, PVCs noted.   2. Left ventricular ejection fraction, by estimation, is 45 to 50%. The  left ventricle has mildly decreased function. The left ventricle has no  regional wall motion abnormalities. There is mild left ventricular  hypertrophy. Left ventricular diastolic  parameters are consistent with Grade I diastolic dysfunction (impaired  relaxation).   3. Right ventricular systolic function is mildly reduced. The right  ventricular size is normal. Tricuspid regurgitation signal is inadequate  for assessing PA pressure.   4. The mitral valve is normal in structure. Mild to moderate mitral valve  regurgitation. No evidence of mitral stenosis.   5. The aortic valve is normal in structure. Aortic valve regurgitation is  mild. No aortic stenosis is present.   6. There is borderline dilatation of the aortic root, measuring 37 mm.  There is mild dilatation of the ascending aorta, measuring 42 mm.   7. The inferior vena cava is normal in size with greater than 50%  respiratory variability, suggesting right atrial pressure of 3 mmHg.   Patient Profile     76 y.o. male with a history of chronic systolic and diastolic heart failure, PAF, COPD,  myasthenia gravis, admitted with PEA arrest in the setting of acute on chronic systolic/diastolic heart failure and hypoxic respiratory failure in the setting of not taking his diuretics.  Hospitalization was complicated by acute on chronic renal failure, pneumonia, and possible myasthenia gravis flare.  Assessment & Plan    Acute on chronic combined systolic and diastolic congestive heart failure -LVEF 35 to 40% by echocardiogram down from 45 to 50% previously -Continue IV Lasix 40 mg twice daily while receiving IVIG infusions -Continue losartan 25 mg daily -Daily weights, strict I&O, and low-sodium foods -Daily BMP while on  diuretic therapy -Plan for [facility] next week tentatively  2.  Paroxysmal atrial fibrillation -Currently sinus rhythm with PVCs -Continue heparin drip pending heart catheterization, may transition back to Eliquis prior to  discharge -Continue with digoxin daily with dig level to be drawn Saturday a.m. -Avoiding beta-blockers due to myasthenia's gravis, holding flecainide and calcium channel blockers due to low EF  3.  Status post PEA arrest -In the setting of decompensated heart failure with respiratory failure -R/LHC tentatively scheduled for the beginning of next week for further ischemic work-up  4.  Hypertensive emergency -Blood pressure better controlled, slightly elevated last evening -Continue losartan 25 mg daily that was increased today -Vital signs per unit protocol -Avoiding beta-blockers in the setting of myasthenia's gravis  5.  Myasthenia gravis -Currently receiving 5 days of IVIG -Treatment and recommendations per neuro is following  For questions or updates, please contact [place] Please consult [email or web address] for contact info under        Signed, Sriyan Cutting, NP  05/12/2022, 10:13 AM

## 2022-05-12 NOTE — Progress Notes (Signed)
Physical Therapy Treatment Patient Details Name: Joseph Macdonald MRN: [id number] DOB: 03-Nov-1946 Today's Date: 05/12/2022   History of Present Illness Pt is a 76 yo M presenting [facility] ED via EMS from home on 04/30/22 in respiratory distress. Per documentation patient was found by EMS hypoxic with SpO2 71%-88% placed on NRB with albuterol administered. Patient received CPR for 8 minutes in ED with no defibrillations, and was emergently intubated requiring mechanical ventilatory support now extubated. MD assessment includes: Severe ACUTE Hypoxic and Hypercapnic Respiratory Failure, HCAP, acute systolic cardiac failure, Myasthenia Gravis query crisis, AKI, and elevated troponin in setting of demand ischemia.    PT Comments    Pt seen for PT tx with pt agreeable. Pt requires max assist for supine>sit even with use of hospital bed features. Pt c/o dizziness upon sitting EOB but nasal cannula found to be disconnected from wall & SpO2 83% so tubing reattached & pt able to increase to >/= 90% on 2L/min. Pt reports dizziness is improved but not gone after prolonged sitting EOB. Pt is able to complete STS with mod assist although with unsafe hand placement during transfer. Pt also progresses to stand pivot to recliner with 2 attempts but pt able to shuffle feet & somewhat weight shift. Pt agreeable to sit up in recliner 1-1.5 hours & notified OT of pt's request to return to bed shortly. Will continue to follow pt acutely to address balance, strengthening, endurance, and gait with LRAD. Pt will require +2 for chair follow for safe gait attempts.  Max HR during session 115 bpm BP in LUE sitting in recliner: 148/84 mmHg MAP 103    Recommendations for follow up therapy are one component of a multi-disciplinary discharge planning process, led by the attending physician.  Recommendations may be updated based on patient status, additional functional criteria and insurance authorization.  Follow Up  Recommendations  Skilled nursing-short term rehab (<3 hours/day)     Assistance Recommended at Discharge Frequent or constant Supervision/Assistance  Patient can return home with the following Two people to help with walking and/or transfers;Two people to help with bathing/dressing/bathroom;Assistance with cooking/housework;Help with stairs or ramp for entrance;Assist for transportation   Equipment Recommendations  Rolling walker (2 wheels);Wheelchair (measurements PT);Wheelchair cushion (measurements PT)    Recommendations for Other Services       Precautions / Restrictions Precautions Precautions: Fall Restrictions Weight Bearing Restrictions: No Other Position/Activity Restrictions: CVC Triple Lumen Right Femoral, flexible type, no mobility restrictions per NP 05/08/22     Mobility  Bed Mobility Overal bed mobility: Needs Assistance Bed Mobility: Supine to Sit     Supine to sit: Max assist, HOB elevated     General bed mobility comments: Cuiong for use of bed rails but ultimately requires max assist to move BLE to EOB & scoot to sitting EOB.    Transfers Overall transfer level: Needs assistance Equipment used: Rolling walker (2 wheels) Transfers: Sit to/from Stand, Bed to chair/wheelchair/BSC Sit to Stand: Mod assist (Poor awareness of safe hand placement as pt continues to pull up on RW with BUE & lean posteriorly on bed with BLE for stability/support during STS.) Stand pivot transfers: Mod assist (Pt requires 2 attempts for stand pivot bed>recliner. Pt initially able to step/scoot L foot but requires rest break before attempting to scoot R foot to complete pivot. Pt with heavy reliance on BUE on RW 2/2 BLE weakness.)              Ambulation/Gait  Stairs             Wheelchair Mobility    Modified Rankin (Stroke Patients Only)       Balance Overall balance assessment: Needs assistance Sitting-balance support: Feet  supported, Bilateral upper extremity supported Sitting balance-Leahy Scale: Good Sitting balance - Comments: supervision static sitting   Standing balance support: Bilateral upper extremity supported, During functional activity, Reliant on assistive device for balance Standing balance-Leahy Scale: Poor                              Cognition Arousal/Alertness: Awake/alert Behavior During Therapy: WFL for tasks assessed/performed Overall Cognitive Status: Within Functional Limits for tasks assessed                                 General Comments: Pleasant gentleman, eager to get better        Exercises      General Comments        Pertinent Vitals/Pain Pain Assessment Pain Assessment: Faces Faces Pain Scale: Hurts even more Pain Location: soreness in chest with pt reporting he feels its from CPR Pain Descriptors / Indicators: Sore Pain Intervention(s): Monitored during session    Home Living                          Prior Function            PT Goals (current goals can now be found in the care plan section) Acute Rehab PT Goals Patient Stated Goal: To walk better with better endurance PT Goal Formulation: With patient Time For Goal Achievement: 05/21/22 Potential to Achieve Goals: Fair Progress towards PT goals: Progressing toward goals    Frequency    Min 2X/week      PT Plan Current plan remains appropriate    Co-evaluation              AM-PAC PT "6 Clicks" Mobility   Outcome Measure  Help needed turning from your back to your side while in a flat bed without using bedrails?: A Lot Help needed moving from lying on your back to sitting on the side of a flat bed without using bedrails?: Total Help needed moving to and from a bed to a chair (including a wheelchair)?: A Lot Help needed standing up from a chair using your arms (e.g., wheelchair or bedside chair)?: A Lot Help needed to walk in hospital room?:  Total Help needed climbing 3-5 steps with a railing? : Total 6 Click Score: 9    End of Session Equipment Utilized During Treatment: Oxygen;Gait belt Activity Tolerance: Patient limited by fatigue;Patient tolerated treatment well Patient left: in chair;with call bell/phone within reach Nurse Communication: Mobility status PT Visit Diagnosis: Muscle weakness (generalized) (M62.81);Difficulty in walking, not elsewhere classified (R26.2);Unsteadiness on feet (R26.81)     Time: 2633-3545 PT Time Calculation (min) (ACUTE ONLY): 23 min  Charges:  $Therapeutic Activity: 23-37 mins                     Lavone Nian, PT, DPT 05/12/22, 11:05 AM    Waunita Schooner 05/12/2022, 11:03 AM

## 2022-05-12 NOTE — Progress Notes (Signed)
PT has been REFUSING NIV/CPAP. Stable on 2L [place].

## 2022-05-12 NOTE — Progress Notes (Signed)
Nutrition Follow-up  DOCUMENTATION CODES:   Obesity unspecified  INTERVENTION:   -Continue Ensure Enlive po BID, each supplement provides 350 kcal and 20 grams of protein -Continue MVI with minerals daily  NUTRITION DIAGNOSIS:   Inadequate oral intake related to inability to eat as evidenced by NPO status.  Progressing; advanced to PO diet on 05/07/22  GOAL:   Patient will meet greater than or equal to 90% of their needs  Progressing   MONITOR:   PO intake, Supplement acceptance  REASON FOR ASSESSMENT:   Ventilator    ASSESSMENT:   Pt presented with cardiac arrest. PMH HTN, HLD, MG, COPD, and arthritis.  5/21- intubated 5/22- TF initiated 5/23- TF stopped secondary to tan secretions coming out of tube 5/24- extubated, re-intubated, TF resumed, s/p bronch 5/27- extubated 5/28- s/p BSE- advanced to dysphagia 3 diet with thin liquids  Reviewed I/O's: +1.9 L x 24 hours and -10.7 L since admission  UOP: 3.5 L x 24 hours  Pt sleeping soundly at time of visit. RD did not disturb.   Pt continues on a dysphagia 3 diet. He is tolerating well. Noted meal completions 50-100%. Pt is drinking Ensure. Noted breakfast tray 100% complete.   Per TOC notes, pt awaiting insurance authorization for SNF placement.   Medications reviewed and include lasix.   Labs reviewed: CBGS: 108-176 (inpatient orders for glycemic control are none).    Diet Order:   Diet Order             DIET DYS 3 Room service appropriate? Yes with Assist; Fluid consistency: Thin  Diet effective now                   EDUCATION NEEDS:   No education needs have been identified at this time  Skin:  Skin Assessment: Skin Integrity Issues: Skin Integrity Issues:: Stage II Stage II: rt buttocks Other: skin tear to rt lower arm, rt posterior elbow  Last BM:  05/12/22 (type 7)  Height:   Ht Readings from Last 1 Encounters:  05/11/22 '5\' 7"'$  (1.702 m)    Weight:   Wt Readings from Last 1  Encounters:  05/12/22 103.2 kg    Ideal Body Weight:  67.3 kg  BMI:  Body mass index is 35.63 kg/m.  Estimated Nutritional Needs:   Kcal:  2000-2200  Protein:  100-115 grams  Fluid:  > 2 L    Loistine Chance, RD, LDN, [place] Registered Dietitian II Certified Diabetes Care and Education Specialist Please refer to [facility] for RD and/or RD on-call/weekend/after hours pager

## 2022-05-12 NOTE — Progress Notes (Signed)
[facility] Progress Note  Patient: Joseph Macdonald    CXF:[id number]  DOA: 04/30/2022    Date of Service: the patient was seen and examined on 05/12/2022  Brief hospital course: 76 year old male with past medical history of morbid obesity, myasthenia gravis, COPD, systolic/diastolic CHF, diabetes mellitus and atrial fibrillation on Eliquis presented to the emergency room on 5/21 with hypoxia requiring nonrebreather and then suffered a cardiac arrest upon arrival to the emergency room.  Patient able to be resuscitated.  CT scan revealed aspiration pneumonia.  Patient admitted to the critical care service and placed on ventilator support.  Initially extubated 5/24 but reintubated same day.  Underwent bronchoscopy removing bloody mucous plugs.  Able to be extubated on 5/27.  Patient transferred out of ICU on 5/29.  Cardiology, neurology and palliative care have been following as well.  Patient also looks to be having an acute flareup of his myasthenia gravis and was started on IVIG as of 5/31.  With IVIG, patient has also been started on aggressive diuresis and is currently diuresed over 16 L and is more than 5 L deficient.  Assessment and Plan: Assessment and Plan: * Cardiac arrest due to respiratory disorder [facility]) Patient with chest wall pain.  No evidence of fractures on x-ray.  Pain control.  Acute respiratory failure with hypoxia (HCC) In the setting of heart failure exacerbation as well as pneumonia.  Status post ventilator support.  Continue steroids and antibiotics and nebulizers.  Pulmonary following.  Improved.  Acute on chronic HFrEF (heart failure with reduced ejection fraction) [facility]) Cardiology following.  Echocardiogram done 5/21 notes ejection fraction of 35 to 40% and grade 1 diastolic dysfunction as well as moderately reduced right ventricular systolic function.  Initially held further diuresis in the setting of hyponatremia.  No beta-blockers given myasthenia gravis.  Restarted  on IV Lasix twice daily which will be stopped following completion of IVIG.  Patient has responded well, diuresing over 16 L and is -5 L deficient.  Plan is for cardiac catheterization next week which after discussion with patient, he is amenable to.  Myasthenia gravis [facility]) Neurology following.  Looks to have a mild myasthenia exacerbation and started on IVIG 5/31.  Continue Mestinon.  Hypertensive emergency Blood pressures after extubation were in the 183F systolic.  Weaned off of nitroglycerin drip.  Avoiding beta-blockers.  Added losartan.  Now started on diuretics  Paroxysmal atrial fibrillation (HCC) Currently in sinus rhythm.  If needed, amiodarone or digoxin.  Currently on heparin drip.  Holding Eliquis.  Pressure injury of skin Pressure Injury Buttocks Right Stage 2 -  Partial thickness loss of dermis presenting as a shallow open injury with a red, pink wound bed without slough. (Active)     Location: Buttocks  Location Orientation: Right  Staging: Stage 2 -  Partial thickness loss of dermis presenting as a shallow open injury with a red, pink wound bed without slough.  Wound Description (Comments):   Present on Admission: No   Stage II right buttock ulcer, not present on admission.  Wound care.   Morbid obesity ([place]) Meets criteria BMI greater than 30 and comorbidities of heart failure and hypertension       Body mass index is 35.63 kg/m.  Nutrition Problem: Inadequate oral intake Etiology: inability to eat Pressure Injury Buttocks Right Stage 2 -  Partial thickness loss of dermis presenting as a shallow open injury with a red, pink wound bed without slough. (Active)     Location: Buttocks  Location  Orientation: Right  Staging: Stage 2 -  Partial thickness loss of dermis presenting as a shallow open injury with a red, pink wound bed without slough.  Wound Description (Comments):   Present on Admission: No  Dressing Type Foam - Lift dressing to assess site every  shift 05/12/22 0757     Consultants: Neurology Critical care Cardiology Palliative care  Procedures: Echocardiogram done 5/21: Ejection fraction of 35 to 66%, grade 1 diastolic dysfunction.  Moderately reduced right ventricular systolic function Plan cardiac catheterization next week  Antimicrobials: Zithromax 5/21 - 5/22 IV Unasyn 5/21 IV Zosyn 5/26-present    Code Status: Now DNR   Subjective: Patient feels quite tired.  Some chest wall pain although improved  Objective: Vital signs were reviewed and unremarkable. Vitals:   05/12/22 0800 05/12/22 1122  BP: (!) 158/84 137/86  Pulse: (!) 115 (!) 114  Resp: (!) 28 20  Temp: 97.8 F (36.6 C) 98.1 F (36.7 C)  SpO2: 93% 96%    Intake/Output Summary (Last 24 hours) at 05/12/2022 1321 Last data filed at 05/12/2022 1100 Gross per 24 hour  Intake 5637.99 ml  Output 3800 ml  Net 1837.99 ml    Filed Weights   05/11/22 0000 05/11/22 0255 05/12/22 0420  Weight: 101.5 kg 101.3 kg 103.2 kg   Body mass index is 35.63 kg/m.  Exam:  General: Alert and oriented x2, no acute distress HEENT: Normocephalic, atraumatic, mucous membranes are slightly dry.  Narrow airway Cardiovascular: Regular rate and rhythm, Z9-D3, 2/6 systolic ejection murmur Respiratory: Decreased breath sounds throughout Abdomen: Soft, obese, nontender, positive bowel sounds Musculoskeletal: Noted chest wall tenderness.  No clubbing or cyanosis, trace pitting edema Skin: No skin breaks, tears or lesions Psychiatry: Appropriate, no evidence of psychoses  Data Reviewed: CBC and be met stable  Disposition:  Status is: Inpatient Remains inpatient appropriate because: Stabilization of respiratory function and myasthenia exacerbation.  Planned cardiac cath    Anticipated discharge date: 6/7 Family Communication: Left message for wife DVT Prophylaxis: SCDs Start: 04/30/22 0350    Author: Annita Brod ,MD 05/12/2022 1:21 PM  To reach On-call,  see care teams to locate the attending and reach out via [email or web address]. Between 7PM-7AM, please contact night-coverage If you still have difficulty reaching the attending provider, please page the [facility] (Director on Call) for [facility] on amion for assistance.

## 2022-05-12 NOTE — TOC Progression Note (Addendum)
Transition of Care [facility]) - Progression Note    Patient Details  Name: Joseph Macdonald MRN: [id number] Date of Birth: 01-Mar-1946  Transition of Care [facility]) CM/SW [place], [place] Phone Number: 05/12/2022, 2:50 PM  Clinical Narrative:     Update: Auth approved by Apolonio Schneiders number for [facility]: [id number] Auth number for SNF : [id number] Good for 7 days   CSW spoke with HTA to initiate auth for [facility] with anticipated dc for Monday 6/5, auth started.   CSW has updated patient's daughter.   Expected Discharge Plan:  (TBD) Barriers to Discharge: Continued Medical Work up  Expected Discharge Plan and Services Expected Discharge Plan:  (TBD)       Living arrangements for the past 2 months: Single Family Home                                       Social Determinants of Health (SDOH) Interventions    Readmission Risk Interventions     View : No data to display.

## 2022-05-12 NOTE — Progress Notes (Signed)
Physical Therapy Treatment Patient Details Name: Joseph Macdonald MRN: [id number] DOB: [birth year] Today's Date: 05/12/2022   History of Present Illness Joseph Macdonald is a 76 yo M presenting to [facility] ED via EMS from home on 04/30/22 in respiratory distress. Per documentation patient was found by EMS hypoxic with SpO2 71%-88% placed on NRB with albuterol administered. Patient received CPR for 8 minutes in ED with no defibrillations, and was emergently intubated requiring mechanical ventilatory support now extubated. MD assessment includes: Severe ACUTE Hypoxic and Hypercapnic Respiratory Failure, HCAP, acute systolic cardiac failure, Myasthenia Gravis query crisis, AKI, and elevated troponin in setting of demand ischemia.    PT Comments    Author summoned to room to assist with pt transfer back to bed. Pt acutely weaker than earlier in day, more somnolent now. HR elevated in 110s today, but in session is 120bpm or higher while in recliner, higher after brief time in standing. Pt follows cues well for mobility, but is not strong enough to safely use a RW at present. Requires modA to rise to standing, then needs cues and modA to take 3-4 steps pivot toward bed. Pt assisted back to supine, requires modA for lifting legs into bed and mod A to laterally shift pelvis to the center of bed, verbal cues for legs reposition. CP after in supine briefly, but pt reports authors deep palpation to Left anterior to anterolateral ribs 3-6 is consistent with his complaint. Pt having SOB throughout session without hypoxia. NA in session to assist with safe line/management. Pt reports his legs feel liek bucking when in standing. RN made aware of HR, dyspnea, and acutely worse presentation (somnolence, weakness).     Recommendations for follow up therapy are one component of a multi-disciplinary discharge planning process, led by the attending physician.  Recommendations may be updated based on patient status, additional  functional criteria and insurance authorization.  Follow Up Recommendations  Skilled nursing-short term rehab (<3 hours/day)     Assistance Recommended at Discharge Frequent or constant Supervision/Assistance  Patient can return home with the following Two people to help with walking and/or transfers;Two people to help with bathing/dressing/bathroom;Assistance with cooking/housework;Help with stairs or ramp for entrance;Assist for transportation   Equipment Recommendations  Rolling walker (2 wheels);Wheelchair (measurements PT);Wheelchair cushion (measurements PT)    Recommendations for Other Services       Precautions / Restrictions Precautions Precautions: Fall Restrictions Weight Bearing Restrictions: No Other Position/Activity Restrictions: None     Mobility  Bed Mobility Overal bed mobility: Needs Assistance Bed Mobility: Sit to Supine     Supine to sit: Mod assist     General bed mobility comments: cuing for segmented squenced movements, but still very weak.    Transfers Overall transfer level: Needs assistance Equipment used: 1 person hand held assist Transfers: Sit to/from Stand, Bed to chair/wheelchair/BSC Sit to Stand: Mod assist Stand pivot transfers: Min assist         General transfer comment: legs very weak, unsteady; max effort to rise to standing, remains very weak.    Ambulation/Gait Ambulation/Gait assistance:  (unable at this time)                 Marine scientist Rankin (Stroke Patients Only)       Balance     Sitting balance-Leahy Scale: Poor     Standing balance support: Bilateral upper extremity supported Standing balance-Leahy Scale: Poor  Cognition Arousal/Alertness: Awake/alert Behavior During Therapy: WFL for tasks assessed/performed Overall Cognitive Status: Within Functional Limits for tasks assessed                                           Exercises      General Comments        Pertinent Vitals/Pain Pain Assessment Pain Assessment: Faces Faces Pain Scale: Hurts even more Pain Location: left anterior chest after return to bed; pt reports consistent with musculoskeletal palpation post symptoms    Home Living                          Prior Function            PT Goals (current goals can now be found in the care plan section) Acute Rehab PT Goals Patient Stated Goal: To walk better with better endurance PT Goal Formulation: With patient Time For Goal Achievement: 05/21/22 Potential to Achieve Goals: Fair Progress towards PT goals: Not progressing toward goals - comment    Frequency    Min 2X/week      PT Plan Current plan remains appropriate    Co-evaluation              AM-PAC PT "6 Clicks" Mobility   Outcome Measure  Help needed turning from your back to your side while in a flat bed without using bedrails?: A Lot Help needed moving from lying on your back to sitting on the side of a flat bed without using bedrails?: A Lot Help needed moving to and from a bed to a chair (including a wheelchair)?: A Lot Help needed standing up from a chair using your arms (e.g., wheelchair or bedside chair)?: A Lot Help needed to walk in hospital room?: Total Help needed climbing 3-5 steps with a railing? : Total 6 Click Score: 10    End of Session Equipment Utilized During Treatment: Oxygen Activity Tolerance: Patient limited by fatigue;Patient tolerated treatment well Patient left: in bed;with call bell/phone within reach;with family/visitor present Nurse Communication: Mobility status PT Visit Diagnosis: Muscle weakness (generalized) (M62.81);Difficulty in walking, not elsewhere classified (R26.2);Unsteadiness on feet (R26.81)     Time: 0737-1062 PT Time Calculation (min) (ACUTE ONLY): 10 min  Charges:  $Therapeutic Activity: 8-22 mins                     2:05 PM, 05/12/22 Etta Grandchild, PT, DPT Physical Therapist - [facility]  [phone] ([place])     [place] [facility] 05/12/2022, 1:58 PM

## 2022-05-12 NOTE — Consult Note (Signed)
ANTICOAGULATION CONSULT NOTE   Pharmacy Consult for IV Heparin Indication: atrial fibrillation  Patient Measurements: Height: '5\' 7"'$  (170.2 cm) Weight: 101.3 kg (223 lb 5.2 oz) IBW/kg (Calculated) : 66.1 Heparin Dosing Weight: 87.6 kg  Labs: Recent Labs     0000 05/09/22 0529 05/09/22 0921 05/09/22 1759 05/10/22 0451 05/10/22 1431 05/10/22 2157 05/11/22 0706 05/11/22 1645 05/12/22 0122  HGB  --  16.1  --   --  16.1  --   --  16.2  --   --   HCT  --  51.6  --   --  52.7*  --   --  52.8*  --   --   PLT  --  150  --   --  165  --   --  176  --   --   APTT  --   --  28   < > 50* 82* 76* 103*  --   --   LABPROT  --   --  16.1*  --   --   --   --   --   --   --   INR  --   --  1.3*  --   --   --   --   --   --   --   HEPARINUNFRC   < >  --  >1.10*  --  0.57 0.90*  --  0.96* 0.49 0.61  CREATININE  --  0.70  --   --   --   --   --  0.88  --   --    < > = values in this interval not displayed.    Estimated Creatinine Clearance: 82.3 mL/min (by C-G formula based on SCr of 0.88 mg/dL).  Medical History: Past Medical History:  Diagnosis Date   Arthritis    COPD (chronic obstructive pulmonary disease) ([place])    Diabetes mellitus without complication (HCC)    diet controlled   Dyspnea    with activity   Hyperlipidemia    Hypertension    Myasthenia gravis ([place])    Oxygen deficit    Ventricular tachycardia (HCC)     Assessment: 76 y.o. male with chronic systolic and diastolic heart failure, PAF, COPD, myasthenia gravis admitted with PEA arrest in the setting of acute on chronic systolic diastolic heart failure and hypoxic respiratory failure in the setting of not taking his diuretics.Pharmacy consulted to start heparin for afib. Last dose apixaban 5/29 21:57. Will use aPTT for monitoring.   Date Time aPTT/HL Rate/Comment 5/30 0921 28s / >1.1 Baselines labs ; 1200 un/hr (started 10am) 5/30 1800 36s  Subtherapeutic; 1200 > 1500 un/hr   5/31 0451 50s/0.57 aPTT  subtherapeutic 5/31 1431 83s/0.90 aPTT therapeutic x 1 5/31 2157 aPTT 76 6/01 0706 103/0.96 Supratherapeutic 6/01 1645 N/A / 0.49 Therapeutic x 1 6/02 0122 HL 0.61 Therapeutic x 2  Goal of Therapy:  Heparin level 0.3-0.7 units/mL Monitor platelets by anticoagulation protocol: Yes   Plan:  --Heparin level therapeutic x 2 --Continue heparin infusion at 1450 units/hr --Recheck HL daily w/ AM labs while therapeutic --Daily CBC per protocol  Renda Rolls, PharmD, [facility] 05/12/2022 2:42 AM

## 2022-05-12 NOTE — Progress Notes (Addendum)
OT Cancellation Note  Patient Details Name: Joseph Macdonald MRN: [id number] DOB: 07-24-1946   Cancelled Treatment:    Reason Eval/Treat Not Completed: Fatigue/lethargy limiting ability to participate. Chart reviewed, upon arrival pt supine in bed with PT/NT in room. Will reattempt at later time as able.   Dessie Coma, M.S. OTR/L  05/12/22, 1:40 PM  ascom [phone]

## 2022-05-13 ENCOUNTER — Inpatient Hospital Stay: Payer: PPO

## 2022-05-13 DIAGNOSIS — G7000 Myasthenia gravis without (acute) exacerbation: Secondary | ICD-10-CM | POA: Diagnosis not present

## 2022-05-13 DIAGNOSIS — I468 Cardiac arrest due to other underlying condition: Secondary | ICD-10-CM | POA: Diagnosis not present

## 2022-05-13 DIAGNOSIS — I5023 Acute on chronic systolic (congestive) heart failure: Secondary | ICD-10-CM | POA: Diagnosis not present

## 2022-05-13 DIAGNOSIS — J989 Respiratory disorder, unspecified: Secondary | ICD-10-CM | POA: Diagnosis not present

## 2022-05-13 LAB — BASIC METABOLIC PANEL
Anion gap: 12 (ref 5–15)
BUN: 18 mg/dL (ref 8–23)
CO2: 30 mmol/L (ref 22–32)
Calcium: 7.8 mg/dL — ABNORMAL LOW (ref 8.9–10.3)
Chloride: 96 mmol/L — ABNORMAL LOW (ref 98–111)
Creatinine, Ser: 0.87 mg/dL (ref 0.61–1.24)
GFR, Estimated: >60 mL/min (ref >60)
Glucose, Bld: 114 mg/dL — ABNORMAL HIGH (ref 70–99)
Potassium: 3.5 mmol/L (ref 3.5–5.1)
Sodium: 138 mmol/L (ref 135–145)

## 2022-05-13 LAB — GLUCOSE, CAPILLARY
Glucose-Capillary: 144 mg/dL — ABNORMAL HIGH (ref 70–99)
Glucose-Capillary: 157 mg/dL — ABNORMAL HIGH (ref 70–99)
Glucose-Capillary: 153 mg/dL — ABNORMAL HIGH (ref 70–99)
Glucose-Capillary: 120 mg/dL — ABNORMAL HIGH (ref 70–99)

## 2022-05-13 LAB — DIGOXIN LEVEL: Digoxin Level: 0.8 ng/mL (ref 0.8–2.0)

## 2022-05-13 LAB — CBC
HCT: 45.1 % (ref 39.0–52.0)
Hemoglobin: 13.7 g/dL (ref 13.0–17.0)
MCH: 29.5 pg (ref 26.0–34.0)
MCHC: 30.4 g/dL (ref 30.0–36.0)
MCV: 97 fL (ref 80.0–100.0)
Platelets: 140 10*3/uL — ABNORMAL LOW (ref 150–400)
RBC: 4.65 MIL/uL (ref 4.22–5.81)
RDW: 13.5 % (ref 11.5–15.5)
WBC: 11.5 10*3/uL — ABNORMAL HIGH (ref 4.0–10.5)
nRBC: 0 % (ref 0.0–0.2)

## 2022-05-13 LAB — HEPARIN LEVEL (UNFRACTIONATED): Heparin Unfractionated: 0.37 IU/mL (ref 0.30–0.70)

## 2022-05-13 MED ORDER — POTASSIUM CHLORIDE CRYS ER 20 MEQ PO TBCR
40.0000 meq | EXTENDED_RELEASE_TABLET | Freq: Every day | ORAL | Status: DC
  Administered 2022-05-13: 40 meq via ORAL
  Filled 2022-05-13: qty 2

## 2022-05-13 MED ORDER — FUROSEMIDE 10 MG/ML IJ SOLN
80.0000 mg | Freq: Two times a day (BID) | INTRAMUSCULAR | Status: DC
  Administered 2022-05-13: 80 mg via INTRAVENOUS
  Filled 2022-05-13: qty 8

## 2022-05-13 MED ORDER — SODIUM CHLORIDE 0.9% FLUSH
3.0000 mL | Freq: Two times a day (BID) | INTRAVENOUS | Status: DC
  Administered 2022-05-13 – 2022-05-18 (×10): 3 mL via INTRAVENOUS

## 2022-05-13 MED ORDER — FUROSEMIDE 10 MG/ML IJ SOLN
40.0000 mg | Freq: Once | INTRAMUSCULAR | Status: AC
  Administered 2022-05-13: 40 mg via INTRAVENOUS
  Filled 2022-05-13: qty 4

## 2022-05-13 NOTE — Progress Notes (Signed)
Subjective: The patient states that he does not feel any stronger since starting IVIG. Today is day 4/5 of IVIG.   Objective: Current vital signs: BP (!) 143/93 (BP Location: Left Arm)   Pulse (!) 109   Temp 98.5 F (36.9 C) (Oral)   Resp 16   Ht _0  (1.702 m)   Wt 103.2 kg   SpO2 97%   BMI 35.63 kg/m  Vital signs in last 24 hours: Temp:  [98 F (36.7 C)-99 F (37.2 C)] 98.5 F (36.9 C) (06/03 0305) Pulse Rate:  [98-109] 109 (06/03 0737) Resp:  [16-20] 16 (06/03 0737) BP: (118-149)/(73-93) 143/93 (06/03 0737) SpO2:  [92 %-97 %] 97 % (06/03 0803)  Intake/Output from previous day: 06/02 0701 - 06/03 0700 In: 1378.4 [P.O.:1200; I.V.:178.4] Out: 3600 [Urine:3600] Intake/Output this shift: Total I/O In: 240 [P.O.:240] Out: 1500 [Urine:1500] Nutritional status:  Diet Order             DIET DYS 3 Room service appropriate? Yes with Assist; Fluid consistency: Thin  Diet effective now                  HEENT: Gerty/AT Lungs: Somewhat shallow breaths when conversing at the bedside Ext: Chronic bruising and thin skin x 4.    Neurological exam: Ment: Intact to complex questions and commands. No aphasia noted. Dysarthric speech is noted.  CN: Fixates normally. Bilateral ptosis is present, unchanged since prior exam. Eyes are conjugate. Mild bulbar weakness is noted.  Motor/Sensory/Cerebellar: Patient defers further exam at this time.    Lab Results: Results for orders placed or performed during the hospital encounter of 04/30/22 (from the past 48 hour(s))  Glucose, capillary     Status: Abnormal   Collection Time: 05/11/22  4:28 PM  Result Value Ref Range   Glucose-Capillary 143 (H) 70 - 99 mg/dL    Comment: Glucose reference range applies only to samples taken after fasting for at least 8 hours.  Heparin level (unfractionated)     Status: None   Collection Time: 05/11/22  4:45 PM  Result Value Ref Range   Heparin Unfractionated 0.49 0.30 - 0.70 IU/mL    Comment:  (NOTE) The clinical reportable range upper limit is being lowered to >1.10 to align with the FDA approved guidance for the current laboratory assay.  If heparin results are below expected values, and patient dosage has  been confirmed, suggest follow up testing of antithrombin III levels. Performed at [facility], [address] [place], [place] [address]   Glucose, capillary     Status: Abnormal   Collection Time: 05/11/22  9:00 PM  Result Value Ref Range   Glucose-Capillary 166 (H) 70 - 99 mg/dL    Comment: Glucose reference range applies only to samples taken after fasting for at least 8 hours.  Heparin level (unfractionated)     Status: None   Collection Time: 05/12/22  1:22 AM  Result Value Ref Range   Heparin Unfractionated 0.61 0.30 - 0.70 IU/mL    Comment: (NOTE) The clinical reportable range upper limit is being lowered to >1.10 to align with the FDA approved guidance for the current laboratory assay.  If heparin results are below expected values, and patient dosage has  been confirmed, suggest follow up testing of antithrombin III levels. Performed at [facility], [address] [place], [place] [address]   CBC     Status: Abnormal   Collection Time: 05/12/22  7:46 AM  Result Value Ref  Range   WBC 9.0 4.0 - 10.5 K/uL   RBC 5.20 4.22 - 5.81 MIL/uL   Hemoglobin 15.1 13.0 - 17.0 g/dL   HCT 51.2 39.0 - 52.0 %   MCV 98.5 80.0 - 100.0 fL   MCH 29.0 26.0 - 34.0 pg   MCHC 29.5 (L) 30.0 - 36.0 g/dL   RDW 13.3 11.5 - 15.5 %   Platelets 143 (L) 150 - 400 K/uL   nRBC 0.0 0.0 - 0.2 %    Comment: Performed at [facility], [address], [place], [place] [address]  Basic metabolic panel     Status: Abnormal   Collection Time: 05/12/22  7:46 AM  Result Value Ref Range   Sodium 135 135 - 145 mmol/L   Potassium 3.5 3.5 - 5.1 mmol/L   Chloride 100 98 - 111 mmol/L   CO2 28 22 - 32 mmol/L   Glucose, Bld 119 (H) 70 - 99 mg/dL    Comment:  Glucose reference range applies only to samples taken after fasting for at least 8 hours.   BUN 19 8 - 23 mg/dL   Creatinine, Ser 0.81 0.61 - 1.24 mg/dL   Calcium 7.6 (L) 8.9 - 10.3 mg/dL   GFR, Estimated >60 >60 mL/min    Comment: (NOTE) Calculated using the CKD-EPI Creatinine Equation (2021)    Anion gap 7 5 - 15    Comment: Performed at [facility], [address] [place], [place] [address]  Glucose, capillary     Status: Abnormal   Collection Time: 05/12/22  8:13 AM  Result Value Ref Range   Glucose-Capillary 108 (H) 70 - 99 mg/dL    Comment: Glucose reference range applies only to samples taken after fasting for at least 8 hours.  Glucose, capillary     Status: Abnormal   Collection Time: 05/12/22 11:25 AM  Result Value Ref Range   Glucose-Capillary 176 (H) 70 - 99 mg/dL    Comment: Glucose reference range applies only to samples taken after fasting for at least 8 hours.  Glucose, capillary     Status: Abnormal   Collection Time: 05/12/22  4:59 PM  Result Value Ref Range   Glucose-Capillary 161 (H) 70 - 99 mg/dL    Comment: Glucose reference range applies only to samples taken after fasting for at least 8 hours.  Glucose, capillary     Status: Abnormal   Collection Time: 05/12/22  9:04 PM  Result Value Ref Range   Glucose-Capillary 130 (H) 70 - 99 mg/dL    Comment: Glucose reference range applies only to samples taken after fasting for at least 8 hours.  Digoxin level     Status: None   Collection Time: 05/13/22  4:56 AM  Result Value Ref Range   Digoxin Level 0.8 0.8 - 2.0 ng/mL    Comment: Performed at [facility], [place], [place] [address]  Heparin level (unfractionated)     Status: None   Collection Time: 05/13/22  4:56 AM  Result Value Ref Range   Heparin Unfractionated 0.37 0.30 - 0.70 IU/mL    Comment: (NOTE) The clinical reportable range upper limit is being lowered to >1.10 to align with the FDA approved guidance for the  current laboratory assay.  If heparin results are below expected values, and patient dosage has  been confirmed, suggest follow up testing of antithrombin III levels. Performed at [facility], [address], [place], [place] [address]   CBC  Status: Abnormal   Collection Time: 05/13/22  6:39 AM  Result Value Ref Range   WBC 11.5 (H) 4.0 - 10.5 K/uL   RBC 4.65 4.22 - 5.81 MIL/uL   Hemoglobin 13.7 13.0 - 17.0 g/dL   HCT 45.1 39.0 - 52.0 %   MCV 97.0 80.0 - 100.0 fL   MCH 29.5 26.0 - 34.0 pg   MCHC 30.4 30.0 - 36.0 g/dL   RDW 13.5 11.5 - 15.5 %   Platelets 140 (L) 150 - 400 K/uL   nRBC 0.0 0.0 - 0.2 %    Comment: Performed at [facility], [address], [place], [place] [address]  Basic metabolic panel     Status: Abnormal   Collection Time: 05/13/22  7:06 AM  Result Value Ref Range   Sodium 138 135 - 145 mmol/L   Potassium 3.5 3.5 - 5.1 mmol/L   Chloride 96 (L) 98 - 111 mmol/L   CO2 30 22 - 32 mmol/L   Glucose, Bld 114 (H) 70 - 99 mg/dL    Comment: Glucose reference range applies only to samples taken after fasting for at least 8 hours.   BUN 18 8 - 23 mg/dL   Creatinine, Ser 0.87 0.61 - 1.24 mg/dL   Calcium 7.8 (L) 8.9 - 10.3 mg/dL   GFR, Estimated >60 >60 mL/min    Comment: (NOTE) Calculated using the CKD-EPI Creatinine Equation (2021)    Anion gap 12 5 - 15    Comment: Performed at [facility], [address] [place], [place] [address]  Glucose, capillary     Status: Abnormal   Collection Time: 05/13/22  7:35 AM  Result Value Ref Range   Glucose-Capillary 144 (H) 70 - 99 mg/dL    Comment: Glucose reference range applies only to samples taken after fasting for at least 8 hours.   Comment 1 Notify RN   Glucose, capillary     Status: Abnormal   Collection Time: 05/13/22 12:15 PM  Result Value Ref Range   Glucose-Capillary 157 (H) 70 - 99 mg/dL    Comment: Glucose reference range applies only to samples taken after fasting for at  least 8 hours.    Recent Results (from the past 240 hour(s))  Culture, BAL-quantitative w Gram Stain     Status: Abnormal   Collection Time: 05/03/22  5:47 PM   Specimen: Bronchoalveolar Lavage; Respiratory  Result Value Ref Range Status   Specimen Description   Final    BRONCHIAL ALVEOLAR LAVAGE Performed at [facility], [address], [place], [place] [address]    Special Requests   Final    Immunocompromised Performed at [facility], [address] [place], [place] [address]    Gram Stain   Final    ABUNDANT WBC PRESENT, PREDOMINANTLY PMN RARE BUDDING YEAST SEEN RARE GRAM NEGATIVE RODS    Culture (A)  Final    20,000 COLONIES/mL ESCHERICHIA COLI 2,000 COLONIES/mL KLEBSIELLA PNEUMONIAE NO STAPHYLOCOCCUS AUREUS ISOLATED No Pseudomonas species isolated Performed at [facility], [place] [address] [place], [place] [address]    Report Status 05/06/2022 FINAL  Final   Organism ID, Bacteria ESCHERICHIA COLI (A)  Final   Organism ID, Bacteria KLEBSIELLA PNEUMONIAE (A)  Final      Susceptibility   Escherichia coli - MIC*    AMPICILLIN >=32 RESISTANT Resistant     CEFAZOLIN 16 SENSITIVE Sensitive     CEFEPIME <=0.12 SENSITIVE Sensitive     CEFTAZIDIME <=1 SENSITIVE  Sensitive     CEFTRIAXONE <=0.25 SENSITIVE Sensitive     CIPROFLOXACIN <=0.25 SENSITIVE Sensitive     GENTAMICIN <=1 SENSITIVE Sensitive     IMIPENEM <=0.25 SENSITIVE Sensitive     TRIMETH/SULFA <=20 SENSITIVE Sensitive     AMPICILLIN/SULBACTAM >=32 RESISTANT Resistant     PIP/TAZO <=4 SENSITIVE Sensitive     * 20,000 COLONIES/mL ESCHERICHIA COLI   Klebsiella pneumoniae - MIC*    AMPICILLIN RESISTANT Resistant     CEFAZOLIN <=4 SENSITIVE Sensitive     CEFEPIME <=0.12 SENSITIVE Sensitive     CEFTAZIDIME <=1 SENSITIVE Sensitive     CEFTRIAXONE <=0.25 SENSITIVE Sensitive     CIPROFLOXACIN <=0.25 SENSITIVE Sensitive     GENTAMICIN <=1 SENSITIVE Sensitive     IMIPENEM <=0.25 SENSITIVE  Sensitive     TRIMETH/SULFA <=20 SENSITIVE Sensitive     AMPICILLIN/SULBACTAM 4 SENSITIVE Sensitive     PIP/TAZO <=4 SENSITIVE Sensitive     * 2,000 COLONIES/mL KLEBSIELLA PNEUMONIAE    Lipid Panel No results for input(s): CHOL, TRIG, HDL, CHOLHDL, VLDL, LDLCALC in the last 72 hours.  Studies/Results: DG Chest Port 1 View  Result Date: 05/13/2022 CLINICAL DATA:  Dyspnea EXAM: PORTABLE CHEST 1 VIEW COMPARISON:  05/09/2022 FINDINGS: Stable heart size. Aortic atherosclerosis. Low lung volumes. Persistent small bilateral pleural effusions with similar streaky bibasilar opacities, likely atelectasis. No pneumothorax. IMPRESSION: Persistent small bilateral pleural effusions with similar streaky bibasilar opacities, likely atelectasis. Electronically Signed   By: Davina Poke D.O.   On: 05/13/2022 11:08    Medications: Scheduled:  chlorhexidine  15 mL Mouth Rinse BID   digoxin  0.125 mg Oral Daily   feeding supplement  237 mL Oral BID BM   furosemide  80 mg Intravenous BID   insulin aspart  0-20 Units Subcutaneous TID WC & HS   ipratropium-albuterol  3 mL Nebulization BID   losartan  25 mg Oral Daily   mouth rinse  15 mL Mouth Rinse q12n4p   multivitamin with minerals  1 tablet Oral Daily   nystatin ointment   Topical BID   OLANZapine zydis  5 mg Oral BID   pantoprazole  40 mg Oral Daily   potassium chloride  40 mEq Oral Daily   pyridostigmine  30 mg Oral TID   sodium chloride flush  3 mL Intravenous Q12H   sodium chloride flush  3 mL Intravenous Q12H   Continuous:  sodium chloride Stopped (05/09/22 1434)   heparin 1,450 Units/hr (05/13/22 0550)   Immune Globulin 10% 0 g (05/12/22 0210)    Assessment: 76 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 He shows multiple findings on  examination c/w at least mild myasthenia exacerbation including mild fatigable upgaze and ptosis, weak neck flexion and bulbar weakness as well as diplopia. Given that he was just extubated on Saturday and was subjectively improving, plan on Sunday was to continue to observe for further improvement. If he was not further significantly improved from a strength and respiratory standpoint, then plan was to treat with IVIG for MG flare.  - Dr. Quinn Axe discussed potential IVIG treatment with Dr. Oval Linsey of Cardiology on Sunday. Cardiology is amenable to this plan and has been following the patient. Diuresis parameters to be determined PRN by Cardiology.  - On repeat exam today, he continues to have bulbar  weakness and is mildly tachypneic with conversation.  - The patient's overall pattern of weakness is most consistent with there being a significant component attributable to his myasthenia gravis. Despite diuresis he has only partially improved in terms of strength and respirations. There has been significant time to recover from intubation/sedation.    Recommendations: - Check NIF and FVC at regular intervals.  - Frequent neuro checks - Avoid medications known to worsen myasthenia gravis symptoms.  - Cardiology to watch closely and diurese as needed in the context of IVIG treatment over the next 5 days.  - On day 4 of IVIG. Dose 5/5 to be given on Sunday.   - Continue Mestinon 30 mg po TID.  - Continue heparin gtt for PAF   LOS: 13 days   _0  signed: Dr. Kerney Elbe 05/13/2022  3:34 PM

## 2022-05-13 NOTE — Progress Notes (Signed)
Cardiology Progress Note   Patient Name: Joseph Macdonald Date of Encounter: 05/13/2022  Primary Cardiologist: [name], MD  Subjective   More somnolent this AM.  Inc wob w/ talking.  Family at bedside and say they noted change in mental status starting last night.  No chest pain.  Inpatient Medications    Scheduled Meds:  chlorhexidine  15 mL Mouth Rinse BID   digoxin  0.125 mg Oral Daily   feeding supplement  237 mL Oral BID BM   furosemide  40 mg Intravenous BID   insulin aspart  0-20 Units Subcutaneous TID WC & HS   ipratropium-albuterol  3 mL Nebulization BID   losartan  25 mg Oral Daily   mouth rinse  15 mL Mouth Rinse q12n4p   multivitamin with minerals  1 tablet Oral Daily   nystatin ointment   Topical BID   OLANZapine zydis  5 mg Oral BID   pantoprazole  40 mg Oral Daily   pyridostigmine  30 mg Oral TID   sodium chloride flush  3 mL Intravenous Q12H   Continuous Infusions:  sodium chloride Stopped (05/09/22 1434)   heparin 1,450 Units/hr (05/13/22 0550)   Immune Globulin 10% 0 g (05/12/22 0210)   PRN Meds: sodium chloride, atropine, diazepam, hydrALAZINE, HYDROcodone-acetaminophen, polyethylene glycol, sodium chloride flush   Vital Signs    Vitals:   05/13/22 0205 05/13/22 0305 05/13/22 0737 05/13/22 0803  BP: 136/76 (!) 149/76 (!) 143/93   Pulse: 99 (!) 101 (!) 109   Resp: '20 20 16   '$ Temp: 98.1 F (36.7 C) 98.5 F (36.9 C)    TempSrc: Oral Oral    SpO2: 96% 95% 97% 97%  Weight:      Height:        Intake/Output Summary (Last 24 hours) at 05/13/2022 0953 Last data filed at 05/13/2022 0940 Gross per 24 hour  Intake 1349.4 ml  Output 4050 ml  Net -2700.6 ml   Filed Weights   05/11/22 0000 05/11/22 0255 05/12/22 0420  Weight: 101.5 kg 101.3 kg 103.2 kg    Physical Exam   GEN: Obese, in no acute distress.  HEENT: Grossly normal.  Neck: Supple, obese, difficult to gauge jvp. No carotid bruits or masses. Cardiac: RRR, tachy, no murmurs,  rubs, or gallops. No clubbing, cyanosis, 1+ bilat LE edema.  Radials 2+, DP/PT 1+ and equal bilaterally.  Respiratory:  Inc WOB this AM.  Diminished breath sounds bilat. Bibasilar crackles. GI: Obese, protuberant, firm, nontender, BS + x 4. MS: no deformity or atrophy. Skin: warm and dry, no rash. Neuro:  Strength and sensation are intact. Psych: AAOx3.  Somewhat somnolent.  Labs    Chemistry Recent Labs  Lab 05/09/22 0529 05/11/22 0706 05/12/22 0746  NA 142 139 135  K 3.9 4.0 3.5  CL 103 101 100  CO2 31 34* 28  GLUCOSE 157* 113* 119*  BUN '23 19 19  '$ CREATININE 0.70 0.88 0.81  CALCIUM 8.3* 8.2* 7.6*  GFRNONAA >60 >60 >60  ANIONGAP 8 4* 7     Hematology Recent Labs  Lab 05/11/22 0706 05/12/22 0746 05/13/22 0639  WBC 10.9* 9.0 11.5*  RBC 5.41 5.20 4.65  HGB 16.2 15.1 13.7  HCT 52.8* 51.2 45.1  MCV 97.6 98.5 97.0  MCH 29.9 29.0 29.5  MCHC 30.7 29.5* 30.4  RDW 13.6 13.3 13.5  PLT 176 143* 140*    Cardiac Enzymes  Recent Labs  Lab 04/30/22 0332 04/30/22 0546 04/30/22 1734 04/30/22 1955  TROPONINIHS 373* 1,137* 2,844* 2,210*      BNP    Component Value Date/Time   BNP 971.6 (H) 04/30/2022 0332   Lipids  Lab Results  Component Value Date   CHOL 141 04/03/2022   HDL 62 04/03/2022   LDLCALC 62 04/03/2022   TRIG 92 04/03/2022   CHOLHDL 2.3 04/03/2022    HbA1c  Lab Results  Component Value Date   HGBA1C 6.9 (H) 04/30/2022    Radiology    No results found.  Telemetry    Sinus rhythm to sinus tachy - 1-teens this AM - Personally Reviewed  Cardiac Studies   Echocardiogram completed on 04/30/2022  1. Left ventricular ejection fraction, by estimation, is 35 to 40%. The  left ventricle has moderately decreased function. The left ventricle  demonstrates global hypokinesis. There is mild left ventricular  hypertrophy. Left ventricular diastolic  parameters are consistent with Grade I diastolic dysfunction (impaired  relaxation).   2. Right  ventricular systolic function is moderately reduced. The right  ventricular size is mildly enlarged.   3. The mitral valve is normal in structure. No evidence of mitral valve  regurgitation.   4. The aortic valve is grossly normal. Aortic valve regurgitation is not  visualized.    Echocardiogram completed on 04/20/2022 1. Challenging study, PVCs noted.   2. Left ventricular ejection fraction, by estimation, is 45 to 50%. The  left ventricle has mildly decreased function. The left ventricle has no  regional wall motion abnormalities. There is mild left ventricular  hypertrophy. Left ventricular diastolic  parameters are consistent with Grade I diastolic dysfunction (impaired  relaxation).   3. Right ventricular systolic function is mildly reduced. The right  ventricular size is normal. Tricuspid regurgitation signal is inadequate  for assessing PA pressure.   4. The mitral valve is normal in structure. Mild to moderate mitral valve  regurgitation. No evidence of mitral stenosis.   5. The aortic valve is normal in structure. Aortic valve regurgitation is  mild. No aortic stenosis is present.   6. There is borderline dilatation of the aortic root, measuring 37 mm.  There is mild dilatation of the ascending aorta, measuring 42 mm.   7. The inferior vena cava is normal in size with greater than 50%  respiratory variability, suggesting right atrial pressure of 3 mmHg.   Patient Profile     76 y.o. male with a history of chronic systolic and diastolic heart failure, PAF, COPD, myasthenia gravis, admitted with PEA arrest in the setting of acute on chronic systolic/diastolic heart failure and hypoxic respiratory failure in the setting of not taking his diuretics.  Hospitalization was complicated by acute on chronic renal failure, pneumonia, and possible myasthenia gravis flare.  Assessment & Plan    1.  Acute on chronic combined syst/diast CHF:  EF 35-40% by echo this admission.  IV lasix  resumed 6/1.  Minus 2.2 L overnight and minus 6.8 L since admission.  More somnolent this AM and inc WOB when talking.  Diminished breath sounds on exam w/ 1+ bilat LEE and firm/protuberant abd.  JVP difficult to gauge 2/2 body habitus.  PCXR this AM w/ persistent sm bilat pl effusion and streaky bibasilar opacities, likely atx.  I've given additional '40mg'$  IV lasix and inc to 80 IV bid.  BMET pending this AM.  Cont ARB and digoxin (dig level nl this AM).  No ? blocker in setting of myasthenia gravis.  Plan for R&L heart cath, possibly Monday if clinically stable.  2.  PEA Arrest/NSTEMI:  Presented to the ED 5/21 w/ resp failure and suffered PEA arrest in the setting of hypoxia s/p 8 mins of CPR w/ ROSC.  Has residual chest wall pain, otw stable.  As previously noted, plan for R & L cath next week.  3.  Myasthenia Gravis:  Seen by neuro. Receiving IVIG - day 4/5 today.  4.  HTN Emergency:  BP higher this AM. Cont losartan 25.  Follow w/ escalation of IV lasix.  5.  PAF:  Had some PAF early 5/31, quiescent since starting digoxin.  Dig level nl this AM.  Currently anticoagulated w/ heparin w/ plan for cath as outlined above.  ? blocker relatively contraindicated in setting of MG.  No ccb 2/2 reduced EF.  Signed, Murray Hodgkins, NP  05/13/2022, 9:53 AM    For questions or updates, please contact   Please consult [email or web address] for contact info under Cardiology/STEMI.

## 2022-05-13 NOTE — Plan of Care (Signed)

## 2022-05-13 NOTE — Progress Notes (Signed)
[facility] Progress Note  Patient: Joseph Macdonald    FIE:[id number]  DOA: 04/30/2022    Date of Service: the patient was seen and examined on 05/13/2022  Brief hospital course: 76 year old male with past medical history of morbid obesity, myasthenia gravis, COPD, systolic/diastolic CHF, diabetes mellitus and atrial fibrillation on Eliquis presented to the emergency room on 5/21 with hypoxia requiring nonrebreather and then suffered a cardiac arrest upon arrival to the emergency room.  Patient able to be resuscitated.  CT scan revealed aspiration pneumonia.  Patient admitted to the critical care service and placed on ventilator support.  Initially extubated 5/24 but reintubated same day.  Underwent bronchoscopy removing bloody mucous plugs.  Able to be extubated on 5/27.  Patient transferred out of ICU on 5/29.  Cardiology, neurology and palliative care have been following as well.  Patient also looks to be having an acute flareup of his myasthenia gravis and was started on IVIG as of 5/31.  With IVIG, patient has also been started on aggressive diuresis and is currently diuresed over 18 L and is more than 7 L deficient.  Assessment and Plan: Assessment and Plan: * Cardiac arrest due to respiratory disorder [facility]) Patient with chest wall pain.  No evidence of fractures on x-ray.  Pain control.  Acute respiratory failure with hypoxia (HCC) In the setting of heart failure exacerbation as well as pneumonia.  Status post ventilator support.  Continue steroids and antibiotics and nebulizers.  Pulmonary following.  Improved.  Acute on chronic HFrEF (heart failure with reduced ejection fraction) [facility]) Cardiology following.  Echocardiogram done 5/21 notes ejection fraction of 35 to 40% and grade 1 diastolic dysfunction as well as moderately reduced right ventricular systolic function.  Initially held further diuresis in the setting of hyponatremia.  No beta-blockers given myasthenia gravis.  Restarted  on IV Lasix twice daily which will be stopped following completion of IVIG.  Patient has responded well, diuresing over 18 L and is -7.3 L deficient.  Plan is for cardiac catheterization next week which after discussion with patient, he is amenable to.  Myasthenia gravis [facility]) Neurology following.  Looks to have a mild myasthenia exacerbation and started on IVIG day 4/5.  Continue Mestinon.  Hypertensive emergency Blood pressures after extubation were in the 166A systolic.  Weaned off of nitroglycerin drip.  Avoiding beta-blockers.  Added losartan.  Now started on diuretics  Paroxysmal atrial fibrillation (HCC) Currently in sinus rhythm.  If needed, amiodarone or digoxin.  Currently on heparin drip.  Holding Eliquis.  Pressure injury of skin Pressure Injury Buttocks Right Stage 2 -  Partial thickness loss of dermis presenting as a shallow open injury with a red, pink wound bed without slough. (Active)     Location: Buttocks  Location Orientation: Right  Staging: Stage 2 -  Partial thickness loss of dermis presenting as a shallow open injury with a red, pink wound bed without slough.  Wound Description (Comments):   Present on Admission: No   Stage II right buttock ulcer, not present on admission.  Wound care.   Morbid obesity ([place]) Meets criteria BMI greater than 30 and comorbidities of heart failure and hypertension       Body mass index is 35.63 kg/m.  Nutrition Problem: Inadequate oral intake Etiology: inability to eat Pressure Injury Buttocks Right Stage 2 -  Partial thickness loss of dermis presenting as a shallow open injury with a red, pink wound bed without slough. (Active)     Location: Buttocks  Location Orientation: Right  Staging: Stage 2 -  Partial thickness loss of dermis presenting as a shallow open injury with a red, pink wound bed without slough.  Wound Description (Comments):   Present on Admission: No  Dressing Type Foam - Lift dressing to assess site  every shift 05/12/22 2211     Consultants: Neurology Critical care Cardiology Palliative care  Procedures: Echocardiogram done 5/21: Ejection fraction of 35 to 86%, grade 1 diastolic dysfunction.  Moderately reduced right ventricular systolic function Plan cardiac catheterization next week  Antimicrobials: Zithromax 5/21 - 5/22 IV Unasyn 5/21 IV Zosyn 5/26-present    Code Status: Now DNR   Subjective: Sleeping comfortably  Objective: Vital signs were reviewed and unremarkable. Vitals:   05/13/22 0737 05/13/22 0803  BP: (!) 143/93   Pulse: (!) 109   Resp: 16   Temp:    SpO2: 97% 97%    Intake/Output Summary (Last 24 hours) at 05/13/2022 1423 Last data filed at 05/13/2022 1143 Gross per 24 hour  Intake 1051.4 ml  Output 3550 ml  Net -2498.6 ml    Filed Weights   05/11/22 0000 05/11/22 0255 05/12/22 0420  Weight: 101.5 kg 101.3 kg 103.2 kg   Body mass index is 35.63 kg/m.  Exam:  General: Resting comfortably HEENT: Normocephalic, atraumatic, mucous membranes are slightly dry.  Narrow airway Cardiovascular: Regular rhythm V7-Q4, 2/6 systolic ejection murmur, mild tachycardia Respiratory: Decreased breath sounds throughout Abdomen: Soft, obese, nontender, positive bowel sounds Musculoskeletal: Noted chest wall tenderness.  No clubbing or cyanosis, trace pitting edema Skin: No skin breaks, tears or lesions Psychiatry: Appropriate, no evidence of psychoses  Data Reviewed: Today's labs reviewed, stable  Disposition:  Status is: Inpatient Remains inpatient appropriate because: Stabilization of respiratory function and myasthenia exacerbation.  Planned cardiac cath    Anticipated discharge date: 6/7 Family Communication: Left message for wife DVT Prophylaxis: SCDs Start: 04/30/22 0350    Author: Annita Brod ,MD 05/13/2022 2:23 PM  To reach On-call, see care teams to locate the attending and reach out via [email or web address]. Between 7PM-7AM, please  contact night-coverage If you still have difficulty reaching the attending provider, please page the [facility] (Director on Call) for [facility] on amion for assistance.

## 2022-05-13 NOTE — Plan of Care (Signed)
?  Problem: Clinical Measurements: ?Goal: Will remain free from infection ?Outcome: Progressing ?  ?

## 2022-05-13 NOTE — Consult Note (Signed)
[place] for IV Heparin Indication: atrial fibrillation  Patient Measurements: Height: '5\' 7"'$  (170.2 cm) Weight: 103.2 kg (227 lb 8.2 oz) IBW/kg (Calculated) : 66.1 Heparin Dosing Weight: 87.6 kg  Labs: Recent Labs    05/10/22 1431 05/10/22 1431 05/10/22 2157 05/11/22 0706 05/11/22 1645 05/12/22 0122 05/12/22 0746 05/13/22 0456 05/13/22 0639  HGB  --    < >  --  16.2  --   --  15.1  --  13.7  HCT  --   --   --  52.8*  --   --  51.2  --  45.1  PLT  --   --   --  176  --   --  143*  --  140*  APTT 82*  --  76* 103*  --   --   --   --   --   HEPARINUNFRC 0.90*  --   --  0.96* 0.49 0.61  --  0.37  --   CREATININE  --   --   --  0.88  --   --  0.81  --   --    < > = values in this interval not displayed.    Estimated Creatinine Clearance: 90.2 mL/min (by C-G formula based on SCr of 0.81 mg/dL).  Medical History: Past Medical History:  Diagnosis Date   Arthritis    COPD (chronic obstructive pulmonary disease) ([place])    Diabetes mellitus without complication (HCC)    diet controlled   Dyspnea    with activity   Hyperlipidemia    Hypertension    Myasthenia gravis ([place])    Oxygen deficit    Ventricular tachycardia (HCC)     Assessment: 75 y.o. male with chronic systolic and diastolic heart failure, PAF, COPD, myasthenia gravis admitted with PEA arrest in the setting of acute on chronic systolic diastolic heart failure and hypoxic respiratory failure in the setting of not taking his diuretics.Pharmacy consulted to start heparin for afib. Last dose apixaban 5/29 21:57. Will use aPTT for monitoring.   Date Time aPTT/HL Rate/Comment 5/30 0921 28s / >1.1 Baselines labs ; 1200 un/hr (started 10am) 5/30 1800 36s  Subtherapeutic; 1200 > 1500 un/hr   5/31 0451 50s/0.57 aPTT subtherapeutic 5/31 1431 83s/0.90 aPTT therapeutic x 1 5/31 2157 aPTT 76 6/01 0706 103/0.96 Supratherapeutic 6/01 1645 N/A / 0.49 Therapeutic x 1 6/02 0122 HL  0.61 Therapeutic x 2 6/03 0456 HL 0.37 Therapeutic x 3  Goal of Therapy:  Heparin level 0.3-0.7 units/mL Monitor platelets by anticoagulation protocol: Yes   Plan:  --Heparin level therapeutic x 3 --Continue heparin infusion at 1450 units/hr --Recheck HL daily w/ AM labs while therapeutic --Daily CBC per protocol  Renda Rolls, PharmD, [facility] 05/13/2022 7:06 AM

## 2022-05-14 ENCOUNTER — Inpatient Hospital Stay: Payer: PPO

## 2022-05-14 DIAGNOSIS — I468 Cardiac arrest due to other underlying condition: Secondary | ICD-10-CM | POA: Diagnosis not present

## 2022-05-14 DIAGNOSIS — J989 Respiratory disorder, unspecified: Secondary | ICD-10-CM | POA: Diagnosis not present

## 2022-05-14 DIAGNOSIS — G7000 Myasthenia gravis without (acute) exacerbation: Secondary | ICD-10-CM | POA: Diagnosis not present

## 2022-05-14 DIAGNOSIS — J9601 Acute respiratory failure with hypoxia: Secondary | ICD-10-CM | POA: Diagnosis not present

## 2022-05-14 DIAGNOSIS — I5023 Acute on chronic systolic (congestive) heart failure: Secondary | ICD-10-CM | POA: Diagnosis not present

## 2022-05-14 LAB — CBC
HCT: 45.3 % (ref 39.0–52.0)
Hemoglobin: 13.6 g/dL (ref 13.0–17.0)
MCH: 29.1 pg (ref 26.0–34.0)
MCHC: 30 g/dL (ref 30.0–36.0)
MCV: 96.8 fL (ref 80.0–100.0)
Platelets: 178 10*3/uL (ref 150–400)
RBC: 4.68 MIL/uL (ref 4.22–5.81)
RDW: 13.7 % (ref 11.5–15.5)
WBC: 10.8 10*3/uL — ABNORMAL HIGH (ref 4.0–10.5)
nRBC: 0 % (ref 0.0–0.2)

## 2022-05-14 LAB — URINALYSIS, COMPLETE (UACMP) WITH MICROSCOPIC
RBC / HPF: >50 RBC/hpf — ABNORMAL HIGH (ref 0–5)
Specific Gravity, Urine: 1.016 (ref 1.005–1.030)
Squamous Epithelial / HPF: NONE SEEN (ref 0–5)
WBC, UA: >50 WBC/hpf — ABNORMAL HIGH (ref 0–5)

## 2022-05-14 LAB — BASIC METABOLIC PANEL
Anion gap: 5 (ref 5–15)
BUN: 25 mg/dL — ABNORMAL HIGH (ref 8–23)
CO2: 34 mmol/L — ABNORMAL HIGH (ref 22–32)
Calcium: 7.8 mg/dL — ABNORMAL LOW (ref 8.9–10.3)
Chloride: 96 mmol/L — ABNORMAL LOW (ref 98–111)
Creatinine, Ser: 0.87 mg/dL (ref 0.61–1.24)
GFR, Estimated: >60 mL/min (ref >60)
Glucose, Bld: 156 mg/dL — ABNORMAL HIGH (ref 70–99)
Potassium: 3.4 mmol/L — ABNORMAL LOW (ref 3.5–5.1)
Sodium: 135 mmol/L (ref 135–145)

## 2022-05-14 LAB — GLUCOSE, CAPILLARY
Glucose-Capillary: 125 mg/dL — ABNORMAL HIGH (ref 70–99)
Glucose-Capillary: 178 mg/dL — ABNORMAL HIGH (ref 70–99)
Glucose-Capillary: 154 mg/dL — ABNORMAL HIGH (ref 70–99)
Glucose-Capillary: 171 mg/dL — ABNORMAL HIGH (ref 70–99)

## 2022-05-14 LAB — HEPARIN LEVEL (UNFRACTIONATED): Heparin Unfractionated: 0.5 IU/mL (ref 0.30–0.70)

## 2022-05-14 MED ORDER — FUROSEMIDE 10 MG/ML IJ SOLN
40.0000 mg | Freq: Once | INTRAMUSCULAR | Status: AC
Start: 2022-05-14 — End: 2022-05-14
  Administered 2022-05-14: 40 mg via INTRAVENOUS
  Filled 2022-05-14: qty 4

## 2022-05-14 MED ORDER — POTASSIUM CHLORIDE CRYS ER 20 MEQ PO TBCR
40.0000 meq | EXTENDED_RELEASE_TABLET | Freq: Once | ORAL | Status: AC
  Administered 2022-05-14: 40 meq via ORAL
  Filled 2022-05-14: qty 2

## 2022-05-14 MED ORDER — ACETAMINOPHEN 325 MG PO TABS
650.0000 mg | ORAL_TABLET | ORAL | Status: DC | PRN
  Administered 2022-05-14 – 2022-05-17 (×4): 650 mg via ORAL
  Filled 2022-05-14 (×3): qty 2

## 2022-05-14 MED ORDER — FUROSEMIDE 40 MG PO TABS
40.0000 mg | ORAL_TABLET | Freq: Every day | ORAL | Status: DC
  Administered 2022-05-14: 40 mg via ORAL
  Filled 2022-05-14: qty 1

## 2022-05-14 NOTE — Progress Notes (Signed)
Patient refuses to turn and reposition despite complaints of pain to his buttocks.

## 2022-05-14 NOTE — Progress Notes (Signed)
Progress Note   Patient: Joseph Macdonald ZCH:[id number] DOB: Aug 16, 1946 DOA: 04/30/2022     14 DOS: the patient was seen and examined on 05/14/2022   Brief hospital course: 76 year old male with past medical history of morbid obesity, myasthenia gravis, COPD, systolic/diastolic CHF, diabetes mellitus and atrial fibrillation on Eliquis presented to the emergency room on 5/21 with hypoxia requiring nonrebreather and then suffered a cardiac arrest upon arrival to the emergency room.  Patient able to be resuscitated.  CT scan revealed aspiration pneumonia.  Patient admitted to the critical care service and placed on ventilator support.  Initially extubated 5/24 but reintubated same day.  Underwent bronchoscopy removing bloody mucous plugs.  Able to be extubated on 5/27.  Patient transferred out of ICU on 5/29.  Cardiology, neurology and palliative care have been following as well.  Patient also looks to be having an acute flareup of his myasthenia gravis and was started on IVIG as of 5/31.  With IVIG, patient has also been started on aggressive diuresis and is currently diuresed over 18 L and is more than 7 L deficient.  6/4: Left knee pain.  Hematuria   Assessment and Plan: * Cardiac arrest due to respiratory disorder [facility]) Patient with chest wall pain.  No evidence of fractures on x-ray.  Pain control, cardiology planning cardiac cath tomorrow  Acute respiratory failure with hypoxia (HCC) In the setting of heart failure exacerbation as well as pneumonia.  Status post ventilator support.  Continue steroids, antibiotics and nebulizers.  Pulmonary following.  Improved.  Acute on chronic HFrEF (heart failure with reduced ejection fraction) [facility]) Cardiology following.  Echocardiogram done 5/21 notes ejection fraction of 35 to 40% and grade 1 diastolic dysfunction as well as moderately reduced right ventricular systolic function.  Initially held further diuresis in the setting of hyponatremia.  No  beta-blockers given myasthenia gravis.  Restarted on IV Lasix twice daily which will be stopped following completion of IVIG.  Patient has responded Diuresed well is -14 L deficient.  Plan is for cardiac catheterization tomorrow per cardiology note  Net IO Since Admission: -14,708.43 mL [05/14/22 1358]  Myasthenia gravis [facility]) Neurology following.  Looks to have a mild myasthenia exacerbation and started on IVIG day 5/5.  Can stop Mestinon per neurology  Hypertensive emergency Blood pressures after extubation were in the 287O systolic.  Weaned off of nitroglycerin drip.  Avoiding beta-blockers.  Continue losartan, Lasix and as needed hydralazine  Paroxysmal atrial fibrillation (HCC) Currently in sinus rhythm.  Continue digoxin.  Holding Eliquis.  Was on heparin drip but on hold due to hematuria.  Resume heparin if hematuria improves or slows down later tonight or first in the morning  Pressure injury of skin Pressure Injury Buttocks Right Stage 2 -  Partial thickness loss of dermis presenting as a shallow open injury with a red, pink wound bed without slough. (Active)     Location: Buttocks  Location Orientation: Right  Staging: Stage 2 -  Partial thickness loss of dermis presenting as a shallow open injury with a red, pink wound bed without slough.  Wound Description (Comments):   Present on Admission: No   Stage II right buttock ulcer, not present on admission.  Wound care per wound care nurse   Morbid obesity ([place]) Meets criteria BMI greater than 30 and comorbidities of heart failure and hypertension.  Complicates overall prognosis        Subjective: Complains of right knee pain.  Mild swelling and redness below the patella, red  and cloudy urine  Physical Exam: Vitals:   05/14/22 0537 05/14/22 0718 05/14/22 0756 05/14/22 1119  BP: 140/77  (!) 145/73 131/71  Pulse: (!) 107  (!) 102 (!) 105  Resp: '20  19 19  '$ Temp: 97.8 F (36.6 C)  98.5 F (36.9 C) 98.1 F (36.7 C)   TempSrc: Oral     SpO2: 92% 92% 93% 93%  Weight:      Height:       General: Resting comfortably HEENT: Normocephalic, atraumatic, mucous membranes are slightly dry.  Narrow airway Cardiovascular: Regular rhythm Z6-S0, 2/6 systolic ejection murmur, mild tachycardia Respiratory: Decreased breath sounds throughout Abdomen: Soft, obese, nontender, positive bowel sounds Musculoskeletal: Noted chest wall tenderness.  Hematoma/ecchymosis below right knee area which is also tender to touch Skin: No skin breaks, tears or lesions Psychiatry: Appropriate, no evidence of psychoses  Data Reviewed:  Potassium 3.4  Family Communication: None  Disposition: Status is: Inpatient Remains inpatient appropriate because: Getting cardiac cath tomorrow   Planned Discharge Destination: Home with Home Health    DVT prophylaxis- SCDs.  Heparin on hold as he had hematuria Time spent: 25 minutes  Author: Max Sane, MD 05/14/2022 1:59 PM  For on call review [email or web address].

## 2022-05-14 NOTE — Progress Notes (Signed)
Pt c/o left knee pain, upon assessment, knee tender to touch, slightly warmer than right, pt denies trauma. Small knot and bruising noted under knee/lower leg/MD made aware, MD to bedside to assess, xray and tylenol ordered.

## 2022-05-14 NOTE — Consult Note (Signed)
[place] for IV Heparin Indication: atrial fibrillation  Patient Measurements: Height: '5\' 7"'$  (170.2 cm) Weight: 102 kg (224 lb 13.9 oz) IBW/kg (Calculated) : 66.1 Heparin Dosing Weight: 87.6 kg  Labs: Recent Labs    05/11/22 0706 05/11/22 1645 05/12/22 0122 05/12/22 0746 05/13/22 0456 05/13/22 0639 05/13/22 0706 05/14/22 0559  HGB 16.2  --   --  15.1  --  13.7  --  13.6  HCT 52.8*  --   --  51.2  --  45.1  --  45.3  PLT 176  --   --  143*  --  140*  --  178  APTT 103*  --   --   --   --   --   --   --   HEPARINUNFRC 0.96*   < > 0.61  --  0.37  --   --  0.50  CREATININE 0.88  --   --  0.81  --   --  0.87  --    < > = values in this interval not displayed.    Estimated Creatinine Clearance: 83.5 mL/min (by C-G formula based on SCr of 0.87 mg/dL).  Medical History: Past Medical History:  Diagnosis Date   Arthritis    COPD (chronic obstructive pulmonary disease) ([place])    Diabetes mellitus without complication (HCC)    diet controlled   Dyspnea    with activity   Hyperlipidemia    Hypertension    Myasthenia gravis ([place])    Oxygen deficit    Ventricular tachycardia (HCC)     Assessment: 76 y.o. male with chronic systolic and diastolic heart failure, PAF, COPD, myasthenia gravis admitted with PEA arrest in the setting of acute on chronic systolic diastolic heart failure and hypoxic respiratory failure in the setting of not taking his diuretics.Pharmacy consulted to start heparin for afib. Last dose apixaban 5/29 21:57. Will use aPTT for monitoring.   Date Time aPTT/HL Rate/Comment 5/30 0921 28s / >1.1 Baselines labs ; 1200 un/hr (started 10am) 5/30 1800 36s  Subtherapeutic; 1200 > 1500 un/hr   5/31 0451 50s/0.57 aPTT subtherapeutic 5/31 1431 83s/0.90 aPTT therapeutic x 1 5/31 2157 aPTT 76 6/01 0706 103/0.96 Supratherapeutic 6/01 1645 N/A / 0.49 Therapeutic x 1 6/02 0122 HL 0.61 Therapeutic x 2 6/03 0456 HL 0.37 Therapeutic x  3 6/04 0559 HL 0.50 Therapeutic x 4  Goal of Therapy:  Heparin level 0.3-0.7 units/mL Monitor platelets by anticoagulation protocol: Yes   Plan:  --Heparin level therapeutic x 4 --Continue heparin infusion at 1450 units/hr --Recheck HL daily w/ AM labs while therapeutic --Daily CBC per protocol  Renda Rolls, PharmD, [facility] 05/14/2022 6:50 AM

## 2022-05-14 NOTE — Progress Notes (Signed)
Pt with dark brown, reddish urine, hep gtt infusing at 14.5. MD made aware/new orders for U/A and stop heparin. Urine collected and sent to lab.

## 2022-05-14 NOTE — Progress Notes (Signed)
Cardiology Progress Note  Patient ID: Joseph Macdonald MRN: [id number] DOB: 01-04-1946 Date of Encounter: 05/14/2022  Primary Cardiologist: [name], MD  Subjective   Chief Complaint: SOB  HPI: Good diuresis overnight.  Reports he is short of breath.  Net -1.5 L overnight.  Net -14.4 L since admission.  More alert today.  Denies chest pain.  ROS:  All other ROS reviewed and negative. Pertinent positives noted in the HPI.     Inpatient Medications  Scheduled Meds:  chlorhexidine  15 mL Mouth Rinse BID   digoxin  0.125 mg Oral Daily   feeding supplement  237 mL Oral BID BM   insulin aspart  0-20 Units Subcutaneous TID WC & HS   ipratropium-albuterol  3 mL Nebulization BID   losartan  25 mg Oral Daily   mouth rinse  15 mL Mouth Rinse q12n4p   multivitamin with minerals  1 tablet Oral Daily   nystatin ointment   Topical BID   OLANZapine zydis  5 mg Oral BID   pantoprazole  40 mg Oral Daily   pyridostigmine  30 mg Oral TID   sodium chloride flush  3 mL Intravenous Q12H   sodium chloride flush  3 mL Intravenous Q12H   Continuous Infusions:  sodium chloride Stopped (05/09/22 1434)   heparin 1,450 Units/hr (05/14/22 0042)   PRN Meds: sodium chloride, acetaminophen, atropine, diazepam, hydrALAZINE, HYDROcodone-acetaminophen, polyethylene glycol, sodium chloride flush   Vital Signs   Vitals:   05/14/22 0537 05/14/22 0718 05/14/22 0756 05/14/22 1119  BP: 140/77  (!) 145/73 131/71  Pulse: (!) 107  (!) 102 (!) 105  Resp: '20  19 19  '$ Temp: 97.8 F (36.6 C)  98.5 F (36.9 C) 98.1 F (36.7 C)  TempSrc: Oral     SpO2: 92% 92% 93% 93%  Weight:      Height:        Intake/Output Summary (Last 24 hours) at 05/14/2022 1133 Last data filed at 05/14/2022 1122 Gross per 24 hour  Intake 873.06 ml  Output 1950 ml  Net -1076.94 ml      05/14/2022    5:00 AM 05/12/2022    4:20 AM 05/11/2022    2:55 AM  Last 3 Weights  Weight (lbs) 224 lb 13.9 oz 227 lb 8.2 oz 223 lb 5.2 oz   Weight (kg) 102 kg 103.2 kg 101.3 kg      Telemetry  Overnight telemetry shows sinus tachycardia heart rate in the 100s, PVCs noted, which I personally reviewed.    Physical Exam   Vitals:   05/14/22 0537 05/14/22 0718 05/14/22 0756 05/14/22 1119  BP: 140/77  (!) 145/73 131/71  Pulse: (!) 107  (!) 102 (!) 105  Resp: '20  19 19  '$ Temp: 97.8 F (36.6 C)  98.5 F (36.9 C) 98.1 F (36.7 C)  TempSrc: Oral     SpO2: 92% 92% 93% 93%  Weight:      Height:        Intake/Output Summary (Last 24 hours) at 05/14/2022 1133 Last data filed at 05/14/2022 1122 Gross per 24 hour  Intake 873.06 ml  Output 1950 ml  Net -1076.94 ml       05/14/2022    5:00 AM 05/12/2022    4:20 AM 05/11/2022    2:55 AM  Last 3 Weights  Weight (lbs) 224 lb 13.9 oz 227 lb 8.2 oz 223 lb 5.2 oz  Weight (kg) 102 kg 103.2 kg 101.3 kg    Body mass  index is 35.22 kg/m.  General: Well nourished, well developed, in no acute distress Head: Atraumatic, normal size  Eyes: PEERLA, EOMI  Neck: Supple, no JVD Endocrine: No thryomegaly Cardiac: Tachycardia, no murmurs Lungs: Diminished breath sounds bilaterally Abd: Soft, nontender, no hepatomegaly  Ext: No edema, pulses 2+ Musculoskeletal: No deformities, BUE and BLE strength normal and equal Skin: Warm and dry, no rashes   Neuro: Alert and oriented to person, place, time, and situation, CNII-XII grossly intact, no focal deficits  Psych: Normal mood and affect   Labs  High Sensitivity Troponin:   Recent Labs  Lab 04/30/22 0332 04/30/22 0546 04/30/22 1734 04/30/22 1955  TROPONINIHS 373* 1,137* 2,844* 2,210*     Cardiac EnzymesNo results for input(s): TROPONINI in the last 168 hours. No results for input(s): TROPIPOC in the last 168 hours.  Chemistry Recent Labs  Lab 05/12/22 0746 05/13/22 0706 05/14/22 0559  NA 135 138 135  K 3.5 3.5 3.4*  CL 100 96* 96*  CO2 28 30 34*  GLUCOSE 119* 114* 156*  BUN 19 18 25*  CREATININE 0.81 0.87 0.87  CALCIUM 7.6* 7.8*  7.8*  GFRNONAA >60 >60 >60  ANIONGAP '7 12 5    '$ Hematology Recent Labs  Lab 05/12/22 0746 05/13/22 0639 05/14/22 0559  WBC 9.0 11.5* 10.8*  RBC 5.20 4.65 4.68  HGB 15.1 13.7 13.6  HCT 51.2 45.1 45.3  MCV 98.5 97.0 96.8  MCH 29.0 29.5 29.1  MCHC 29.5* 30.4 30.0  RDW 13.3 13.5 13.7  PLT 143* 140* 178   BNPNo results for input(s): BNP, PROBNP in the last 168 hours.  DDimer No results for input(s): DDIMER in the last 168 hours.   Radiology  DG Chest Port 1 View  Result Date: 05/13/2022 CLINICAL DATA:  Dyspnea EXAM: PORTABLE CHEST 1 VIEW COMPARISON:  05/09/2022 FINDINGS: Stable heart size. Aortic atherosclerosis. Low lung volumes. Persistent small bilateral pleural effusions with similar streaky bibasilar opacities, likely atelectasis. No pneumothorax. IMPRESSION: Persistent small bilateral pleural effusions with similar streaky bibasilar opacities, likely atelectasis. Electronically Signed   By: Davina Poke D.O.   On: 05/13/2022 11:08    Cardiac Studies  TTE 04/30/2022  1. Left ventricular ejection fraction, by estimation, is 35 to 40%. The  left ventricle has moderately decreased function. The left ventricle  demonstrates global hypokinesis. There is mild left ventricular  hypertrophy. Left ventricular diastolic  parameters are consistent with Grade I diastolic dysfunction (impaired  relaxation).   2. Right ventricular systolic function is moderately reduced. The right  ventricular size is mildly enlarged.   3. The mitral valve is normal in structure. No evidence of mitral valve  regurgitation.   4. The aortic valve is grossly normal. Aortic valve regurgitation is not  visualized.   Patient Profile  76 year old male with history of systolic heart failure, paroxysmal atrial fibrillation, COPD, myasthenia gravis who was admitted on 04/30/2022 with PEA arrest.  Course has been complicated by acute systolic heart failure with volume overload as well as myasthenia  crisis.  Assessment & Plan   #Acute on chronic systolic heart failure -Admitted with PEA arrest secondary to respiratory failure.  Course is then complicated by acute systolic heart failure as well as myasthenia crisis. -Appears euvolemic on exam.  Good diuresis yesterday.  No further IV diuresis. -Transition to 40 mg of p.o. Lasix today. -Currently on digoxin and no beta-blocker due to myasthenia crisis.  On losartan 25 mg daily.  We will consider further titration of therapy as you  are able. -Plan for left heart catheterization tomorrow.  N.p.o. at midnight.  Mental status appears to be much improved.  Likely could proceed tomorrow.  #Paroxysmal atrial fibrillation -No recurrence.  On digoxin.  Currently awaiting left heart catheterization tomorrow.  On heparin drip.     For questions or updates, please contact [place] Please consult [email or web address] for contact info under   Signed, [place] T. Audie Box, MD, [place]  05/14/2022 11:33 AM

## 2022-05-14 NOTE — Progress Notes (Signed)
Subjective: Last dose of IVIG is due to be given tonight at 10 PM.   Objective: Current vital signs: BP 120/68 (BP Location: Left Arm)   Pulse (!) 102   Temp 98.6 F (37 C)   Resp 17   Ht '5\' 7"'$  (1.702 m)   Wt 102 kg   SpO2 95%   BMI 35.22 kg/m  Vital signs in last 24 hours: Temp:  [97.8 F (36.6 C)-98.8 F (37.1 C)] 98.6 F (37 C) (06/04 2009) Pulse Rate:  [94-107] 102 (06/04 2009) Resp:  [14-21] 17 (06/04 2009) BP: (120-145)/(65-92) 120/68 (06/04 2009) SpO2:  [92 %-97 %] 95 % (06/04 2009) Weight:  [102 kg] 102 kg (06/04 0500)  Intake/Output from previous day: 06/03 0701 - 06/04 0700 In: 1113.1 [P.O.:240; I.V.:873.1] Out: 2650 [Urine:2650] Intake/Output this shift: No intake/output data recorded. Nutritional status:  Diet Order             Diet NPO time specified Except for: Sips with Meds  Diet effective midnight           DIET DYS 3 Room service appropriate? Yes with Assist; Fluid consistency: Thin  Diet effective now                  HEENT: [place]/AT Lungs: Somewhat shallow breaths when conversing at the bedside, without improvement since yesterday.  Ext: Chronic bruising and thin skin x 4. Multiple dressings noted. Mild edema has recurred in distal BLE.    Neurological exam: Ment: Drowsy to somnolent this evening. Difficult to arouse. Frequently dosing off during exam requiring additional stimulation to wake up. Speech is dysarthric but fluent. Able to follow all commands but did have significant difficulty maintaining attention to the extent that he could not correctly follow the instruction to count on one breath for testing of diaphragmatic strength. Oriented to the city, state, year, month and day.   CN: Fixates briefly when asked to count fingers. Mild symmetric bilateral ptosis is present in the context of his somnolence, unchanged since prior exam. Eyes are conjugate. EOMI during brief testing, but patient with poor attention and unable to assess for  fatiguability. Grimace is intact. Phonation somewhat hypophonic, unchanged from yesterday.   Motor: Easily fatiguable. For short bursts, maximum strength elicitable is 4+/5 in upper extremities and 3-4/5 in lower extremities.  Sensory: Intact to FT in all 4 extremities.  Reflexes: Unable to elicit patellar reflexes or achilles reflexes. Trace biceps and brachioradialis bilaterally. 1+ triceps bilaterally.  Cerebellar: No ataxia with FNF.  Gait: Unable to assess due to falls risk concerns.     Lab Results: Results for orders placed or performed during the hospital encounter of 04/30/22 (from the past 48 hour(s))  Glucose, capillary     Status: Abnormal   Collection Time: 05/12/22  9:04 PM  Result Value Ref Range   Glucose-Capillary 130 (H) 70 - 99 mg/dL    Comment: Glucose reference range applies only to samples taken after fasting for at least 8 hours.  Digoxin level     Status: None   Collection Time: 05/13/22  4:56 AM  Result Value Ref Range   Digoxin Level 0.8 0.8 - 2.0 ng/mL    Comment: Performed at [facility], [place], [place] [address]  Heparin level (unfractionated)     Status: None   Collection Time: 05/13/22  4:56 AM  Result Value Ref Range   Heparin Unfractionated 0.37 0.30 - 0.70 IU/mL    Comment: (NOTE) The clinical  reportable range upper limit is being lowered to >1.10 to align with the FDA approved guidance for the current laboratory assay.  If heparin results are below expected values, and patient dosage has  been confirmed, suggest follow up testing of antithrombin III levels. Performed at [facility], [address] [place], [place] [address]   CBC     Status: Abnormal   Collection Time: 05/13/22  6:39 AM  Result Value Ref Range   WBC 11.5 (H) 4.0 - 10.5 K/uL   RBC 4.65 4.22 - 5.81 MIL/uL   Hemoglobin 13.7 13.0 - 17.0 g/dL   HCT 45.1 39.0 - 52.0 %   MCV 97.0 80.0 - 100.0 fL   MCH 29.5 26.0 - 34.0 pg   MCHC 30.4 30.0 -  36.0 g/dL   RDW 13.5 11.5 - 15.5 %   Platelets 140 (L) 150 - 400 K/uL   nRBC 0.0 0.0 - 0.2 %    Comment: Performed at [facility], [address], [place], [place] [address]  Basic metabolic panel     Status: Abnormal   Collection Time: 05/13/22  7:06 AM  Result Value Ref Range   Sodium 138 135 - 145 mmol/L   Potassium 3.5 3.5 - 5.1 mmol/L   Chloride 96 (L) 98 - 111 mmol/L   CO2 30 22 - 32 mmol/L   Glucose, Bld 114 (H) 70 - 99 mg/dL    Comment: Glucose reference range applies only to samples taken after fasting for at least 8 hours.   BUN 18 8 - 23 mg/dL   Creatinine, Ser 0.87 0.61 - 1.24 mg/dL   Calcium 7.8 (L) 8.9 - 10.3 mg/dL   GFR, Estimated >60 >60 mL/min    Comment: (NOTE) Calculated using the CKD-EPI Creatinine Equation (2021)    Anion gap 12 5 - 15    Comment: Performed at [facility], [address] [place],  [address]  Glucose, capillary     Status: Abnormal   Collection Time: 05/13/22  7:35 AM  Result Value Ref Range   Glucose-Capillary 144 (H) 70 - 99 mg/dL    Comment: Glucose reference range applies only to samples taken after fasting for at least 8 hours.   Comment 1 Notify RN   Glucose, capillary     Status: Abnormal   Collection Time: 05/13/22 12:15 PM  Result Value Ref Range   Glucose-Capillary 157 (H) 70 - 99 mg/dL    Comment: Glucose reference range applies only to samples taken after fasting for at least 8 hours.  Glucose, capillary     Status: Abnormal   Collection Time: 05/13/22  4:46 PM  Result Value Ref Range   Glucose-Capillary 153 (H) 70 - 99 mg/dL    Comment: Glucose reference range applies only to samples taken after fasting for at least 8 hours.  Glucose, capillary     Status: Abnormal   Collection Time: 05/13/22  9:31 PM  Result Value Ref Range   Glucose-Capillary 120 (H) 70 - 99 mg/dL    Comment: Glucose reference range applies only to samples taken after fasting for at least 8 hours.  Heparin level  (unfractionated)     Status: None   Collection Time: 05/14/22  5:59 AM  Result Value Ref Range   Heparin Unfractionated 0.50 0.30 - 0.70 IU/mL    Comment: (NOTE) The clinical reportable range upper limit is being lowered to >1.10 to align with the FDA approved guidance for the current laboratory assay.  If heparin results are below expected values, and patient dosage has  been confirmed, suggest follow up testing of antithrombin III levels. Performed at [facility], [address] [place], [place] [address]   CBC     Status: Abnormal   Collection Time: 05/14/22  5:59 AM  Result Value Ref Range   WBC 10.8 (H) 4.0 - 10.5 K/uL   RBC 4.68 4.22 - 5.81 MIL/uL   Hemoglobin 13.6 13.0 - 17.0 g/dL   HCT 45.3 39.0 - 52.0 %   MCV 96.8 80.0 - 100.0 fL   MCH 29.1 26.0 - 34.0 pg   MCHC 30.0 30.0 - 36.0 g/dL   RDW 13.7 11.5 - 15.5 %   Platelets 178 150 - 400 K/uL   nRBC 0.0 0.0 - 0.2 %    Comment: Performed at [facility], [address], [place], [place] [address]  Basic metabolic panel     Status: Abnormal   Collection Time: 05/14/22  5:59 AM  Result Value Ref Range   Sodium 135 135 - 145 mmol/L   Potassium 3.4 (L) 3.5 - 5.1 mmol/L   Chloride 96 (L) 98 - 111 mmol/L   CO2 34 (H) 22 - 32 mmol/L   Glucose, Bld 156 (H) 70 - 99 mg/dL    Comment: Glucose reference range applies only to samples taken after fasting for at least 8 hours.   BUN 25 (H) 8 - 23 mg/dL   Creatinine, Ser 0.87 0.61 - 1.24 mg/dL   Calcium 7.8 (L) 8.9 - 10.3 mg/dL   GFR, Estimated >60 >60 mL/min    Comment: (NOTE) Calculated using the CKD-EPI Creatinine Equation (2021)    Anion gap 5 5 - 15    Comment: Performed at [facility], [address] [place],  [address]  Glucose, capillary     Status: Abnormal   Collection Time: 05/14/22  8:27 AM  Result Value Ref Range   Glucose-Capillary 125 (H) 70 - 99 mg/dL    Comment: Glucose reference range applies only to samples taken after  fasting for at least 8 hours.  Glucose, capillary     Status: Abnormal   Collection Time: 05/14/22 11:20 AM  Result Value Ref Range   Glucose-Capillary 178 (H) 70 - 99 mg/dL    Comment: Glucose reference range applies only to samples taken after fasting for at least 8 hours.  Urinalysis, Complete w Microscopic Urine, Clean Catch     Status: Abnormal   Collection Time: 05/14/22 12:45 PM  Result Value Ref Range   Color, Urine RED (A) YELLOW   APPearance CLOUDY (A) CLEAR   Specific Gravity, Urine 1.016 1.005 - 1.030   pH  5.0 - 8.0    TEST NOT REPORTED DUE TO COLOR INTERFERENCE OF URINE PIGMENT   Glucose, UA (A) NEGATIVE mg/dL    TEST NOT REPORTED DUE TO COLOR INTERFERENCE OF URINE PIGMENT   Hgb urine dipstick (A) NEGATIVE    TEST NOT REPORTED DUE TO COLOR INTERFERENCE OF URINE PIGMENT   Bilirubin Urine (A) NEGATIVE    TEST NOT REPORTED DUE TO COLOR INTERFERENCE OF URINE PIGMENT   Ketones, ur (A) NEGATIVE mg/dL    TEST NOT REPORTED DUE TO COLOR INTERFERENCE OF URINE PIGMENT   Protein, ur (A) NEGATIVE mg/dL    TEST NOT REPORTED DUE TO COLOR INTERFERENCE OF URINE PIGMENT   Nitrite (A) NEGATIVE    TEST NOT REPORTED DUE TO COLOR INTERFERENCE OF URINE PIGMENT   Leukocytes,Ua (A)  NEGATIVE    TEST NOT REPORTED DUE TO COLOR INTERFERENCE OF URINE PIGMENT   RBC / HPF >50 (H) 0 - 5 RBC/hpf   WBC, UA >50 (H) 0 - 5 WBC/hpf   Bacteria, UA MANY (A) NONE SEEN   Squamous Epithelial / LPF NONE SEEN 0 - 5    Comment: Performed at [facility], [address] [place],  [address]  Glucose, capillary     Status: Abnormal   Collection Time: 05/14/22  4:49 PM  Result Value Ref Range   Glucose-Capillary 154 (H) 70 - 99 mg/dL    Comment: Glucose reference range applies only to samples taken after fasting for at least 8 hours.    No results found for this or any previous visit (from the past 240 hour(s)).  Lipid Panel No results for input(s): CHOL, TRIG, HDL, CHOLHDL, VLDL, LDLCALC in  the last 72 hours.  Studies/Results: DG Knee 1-2 Views Left  Result Date: 05/14/2022 CLINICAL DATA:  Left knee pain without injury EXAM: LEFT KNEE - 1-2 VIEW COMPARISON:  None Available. FINDINGS: Vascular calcification. Tricompartmental degenerative changes with joint space narrowing medially. Probable small to moderate suprapatellar joint effusion. Soft tissue swelling above the knee. No other acute abnormalities. IMPRESSION: Degenerative changes as above. Suprapatellar joint effusion. Vascular calcifications. Electronically Signed   By: Dorise Bullion III M.D.   On: 05/14/2022 11:38   DG Chest Port 1 View  Result Date: 05/13/2022 CLINICAL DATA:  Dyspnea EXAM: PORTABLE CHEST 1 VIEW COMPARISON:  05/09/2022 FINDINGS: Stable heart size. Aortic atherosclerosis. Low lung volumes. Persistent small bilateral pleural effusions with similar streaky bibasilar opacities, likely atelectasis. No pneumothorax. IMPRESSION: Persistent small bilateral pleural effusions with similar streaky bibasilar opacities, likely atelectasis. Electronically Signed   By: Davina Poke D.O.   On: 05/13/2022 11:08    Medications: Scheduled:  chlorhexidine  15 mL Mouth Rinse BID   digoxin  0.125 mg Oral Daily   feeding supplement  237 mL Oral BID BM   furosemide  40 mg Oral Daily   insulin aspart  0-20 Units Subcutaneous TID WC & HS   ipratropium-albuterol  3 mL Nebulization BID   losartan  25 mg Oral Daily   mouth rinse  15 mL Mouth Rinse q12n4p   multivitamin with minerals  1 tablet Oral Daily   nystatin ointment   Topical BID   OLANZapine zydis  5 mg Oral BID   pantoprazole  40 mg Oral Daily   pyridostigmine  30 mg Oral TID   sodium chloride flush  3 mL Intravenous Q12H   sodium chloride flush  3 mL Intravenous Q12H   Continuous:  sodium chloride Stopped (05/09/22 1434)    Assessment: 76 yo male with a history of MG, last emergent immunomodulatory treatment in 2016 for exacerbation at that time (per patient,  plasma exchange) admitted with severe PNA, had cardiac arrest during this admission favored to be 2/2 primary cardiac etiology. He shows multiple findings on examination c/w at least mild myasthenia exacerbation including mild fatigable upgaze and ptosis, weak neck flexion and bulbar weakness as well as diplopia. Given that he was just extubated on Saturday and was subjectively improving, plan on Sunday was to continue to observe for further improvement. If he was not further significantly improved from a strength and respiratory standpoint, then plan was to treat with IVIG for MG flare.  - On follow up exam today, he is somnolent with diffuse mild BUE weakness and BLE weakness rated as 3-4/5  in the context of poor attention/effort and recurrence of BLE edema. He continues to have mild bulbar weakness and mild tachypnea with conversation.  - DDx:  - Based on today's exam, with shallow breaths during speech but relatively intact upper extremity strength and only mild bulbar weakness, as well as lack of clear improvement with IVIG, it is now felt that multifactorial weakness due to CHF, deconditioning and primary pulmonary etiologies are more likely explanations for his weakness and dysarthria than MG exacerbation.  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 Last ABG on 5/30 had mildly elevated pCO2 of 49, low pO2 and acid-base excess of 6.7 with bicarb of 32.5, all trending up from the ABG on 5/24.  - PAF: Heparin gtt on hold for hematuria.   Recommendations: - Frequent neuro checks - Avoid medications known to worsen myasthenia gravis symptoms.  - Has completed his full course of IVIG.  - Continue Mestinon 30 mg po TID.  - Diuresis parameters to be determined by Cardiology. Has an apparent deficit based on I/O's but clinically has recrudescence of lower extremity edema and mild tachypnea suggestive of volume overload.  Would call  Cardiology back to discuss as patient with shallow breaths when conversing at bedside today in addition to recurrence of mild distal BLE edema.  - Respiratory therapy to check NIF and FVC now (ordered) - ABG now to assess for possible hypercarbia (ordered) - Neurology to continue to follow   LOS: 14 days   '@Electronically'$  signed: Dr. Kerney Elbe 05/14/2022  8:21 PM

## 2022-05-15 DIAGNOSIS — I480 Paroxysmal atrial fibrillation: Secondary | ICD-10-CM | POA: Diagnosis not present

## 2022-05-15 DIAGNOSIS — J989 Respiratory disorder, unspecified: Secondary | ICD-10-CM | POA: Diagnosis not present

## 2022-05-15 DIAGNOSIS — G7000 Myasthenia gravis without (acute) exacerbation: Secondary | ICD-10-CM | POA: Diagnosis not present

## 2022-05-15 DIAGNOSIS — I5023 Acute on chronic systolic (congestive) heart failure: Secondary | ICD-10-CM | POA: Diagnosis not present

## 2022-05-15 LAB — CBC
HCT: 45 % (ref 39.0–52.0)
Hemoglobin: 13.6 g/dL (ref 13.0–17.0)
MCH: 29.7 pg (ref 26.0–34.0)
MCHC: 30.2 g/dL (ref 30.0–36.0)
MCV: 98.3 fL (ref 80.0–100.0)
Platelets: 157 10*3/uL (ref 150–400)
RBC: 4.58 MIL/uL (ref 4.22–5.81)
RDW: 13.7 % (ref 11.5–15.5)
WBC: 9.3 10*3/uL (ref 4.0–10.5)
nRBC: 0 % (ref 0.0–0.2)

## 2022-05-15 LAB — GLUCOSE, CAPILLARY
Glucose-Capillary: 204 mg/dL — ABNORMAL HIGH (ref 70–99)
Glucose-Capillary: 143 mg/dL — ABNORMAL HIGH (ref 70–99)
Glucose-Capillary: 119 mg/dL — ABNORMAL HIGH (ref 70–99)
Glucose-Capillary: 214 mg/dL — ABNORMAL HIGH (ref 70–99)

## 2022-05-15 LAB — BASIC METABOLIC PANEL
Anion gap: 6 (ref 5–15)
BUN: 25 mg/dL — ABNORMAL HIGH (ref 8–23)
CO2: 35 mmol/L — ABNORMAL HIGH (ref 22–32)
Calcium: 7.6 mg/dL — ABNORMAL LOW (ref 8.9–10.3)
Chloride: 99 mmol/L (ref 98–111)
Creatinine, Ser: 0.88 mg/dL (ref 0.61–1.24)
GFR, Estimated: >60 mL/min (ref >60)
Glucose, Bld: 113 mg/dL — ABNORMAL HIGH (ref 70–99)
Potassium: 4.2 mmol/L (ref 3.5–5.1)
Sodium: 140 mmol/L (ref 135–145)

## 2022-05-15 LAB — DIGOXIN LEVEL: Digoxin Level: 0.5 ng/mL — ABNORMAL LOW (ref 0.8–2.0)

## 2022-05-15 LAB — URINE CULTURE: Culture: NO GROWTH

## 2022-05-15 LAB — HEPARIN LEVEL (UNFRACTIONATED): Heparin Unfractionated: 0.14 IU/mL — ABNORMAL LOW (ref 0.30–0.70)

## 2022-05-15 MED ORDER — FUROSEMIDE 10 MG/ML IJ SOLN
40.0000 mg | Freq: Two times a day (BID) | INTRAMUSCULAR | Status: DC
Start: 2022-05-15 — End: 2022-05-16
  Administered 2022-05-15 – 2022-05-16 (×3): 40 mg via INTRAVENOUS
  Filled 2022-05-15 (×4): qty 4

## 2022-05-15 MED ORDER — ASPIRIN 81 MG PO CHEW
81.0000 mg | CHEWABLE_TABLET | ORAL | Status: AC
  Administered 2022-05-16: 81 mg via ORAL
  Filled 2022-05-15: qty 1

## 2022-05-15 MED ORDER — SODIUM CHLORIDE 0.9 % IV SOLN: 250.0000 mL | INTRAVENOUS | Status: DC | PRN

## 2022-05-15 MED ORDER — SODIUM CHLORIDE 0.9 % IV SOLN: INTRAVENOUS | Status: DC

## 2022-05-15 MED ORDER — METOPROLOL TARTRATE 25 MG PO TABS
25.0000 mg | ORAL_TABLET | Freq: Two times a day (BID) | ORAL | Status: DC
  Administered 2022-05-15 – 2022-05-16 (×3): 25 mg via ORAL
  Filled 2022-05-15 (×3): qty 1

## 2022-05-15 MED ORDER — SODIUM CHLORIDE 0.9% FLUSH: 3.0000 mL | INTRAVENOUS | Status: DC | PRN

## 2022-05-15 NOTE — Assessment & Plan Note (Signed)
Currently in sinus rhythm.  Continue digoxin.  Holding Eliquis.  Was on heparin drip but on hold due to hematuria.  Resume heparin if hematuria improves or slows down later tonight or first in the morning

## 2022-05-15 NOTE — Assessment & Plan Note (Signed)
Pressure Injury Buttocks Right Stage 2 -  Partial thickness loss of dermis presenting as a shallow open injury with a red, pink wound bed without slough. (Active)     Location: Buttocks  Location Orientation: Right  Staging: Stage 2 -  Partial thickness loss of dermis presenting as a shallow open injury with a red, pink wound bed without slough.  Wound Description (Comments):   Present on Admission: No   Stage II right buttock ulcer, not present on admission.  Wound care per wound care nurse

## 2022-05-15 NOTE — Assessment & Plan Note (Signed)
Neurology following.  IVIG completed although patient with still some residual weakness and not felt to be related to myasthenia gravis.  Okay by neurology to also start Mestinon.  Could be global deconditioning instead

## 2022-05-15 NOTE — Progress Notes (Signed)
OT Cancellation Note  Patient Details Name: TYRE BEAVER MRN: [id number] DOB: [birth year]   Cancelled Treatment:    Reason Eval/Treat Not Completed: Other (comment). Pt sleeping upon attempt, RN in for assessment. Will re-attempt at later date/time for OT tx.  Ardeth Perfect., MPH, MS, OTR/L ascom [phone] 05/15/22, 11:32 AM

## 2022-05-15 NOTE — Progress Notes (Signed)
Ordered ABG had resulted with elevated pCO2; respiratory had placed BiPAP on patient. Tolerated this for approximately two hours before removing it, citing claustrophobia-related intolerance. Educated on importance of this intervention, adamantly refused. Placed back on nasal cannula.

## 2022-05-15 NOTE — Progress Notes (Addendum)
Cardiology Progress Note   Patient Name: Joseph Macdonald Date of Encounter: 05/15/2022  Primary Cardiologist: [name], MD  Subjective   No chest pain or sob.  Didn't sleep well - tried to wear BiPAP but didn't tolerate.  Able to lie flat.  Hungry.  Inpatient Medications    Scheduled Meds:  chlorhexidine  15 mL Mouth Rinse BID   digoxin  0.125 mg Oral Daily   feeding supplement  237 mL Oral BID BM   insulin aspart  0-20 Units Subcutaneous TID WC & HS   ipratropium-albuterol  3 mL Nebulization BID   losartan  25 mg Oral Daily   mouth rinse  15 mL Mouth Rinse q12n4p   multivitamin with minerals  1 tablet Oral Daily   nystatin ointment   Topical BID   OLANZapine zydis  5 mg Oral BID   pantoprazole  40 mg Oral Daily   pyridostigmine  30 mg Oral TID   sodium chloride flush  3 mL Intravenous Q12H   sodium chloride flush  3 mL Intravenous Q12H   Continuous Infusions:  sodium chloride Stopped (05/09/22 1434)   PRN Meds: sodium chloride, acetaminophen, atropine, diazepam, hydrALAZINE, HYDROcodone-acetaminophen, polyethylene glycol, sodium chloride flush   Vital Signs    Vitals:   05/14/22 2117 05/14/22 2351 05/15/22 0344 05/15/22 0600  BP:  122/71 140/83   Pulse:  97 (!) 103   Resp:  18 18   Temp:  98.3 F (36.8 C) 98.2 F (36.8 C)   TempSrc:      SpO2: 93% 100% 94%   Weight:    103 kg  Height:        Intake/Output Summary (Last 24 hours) at 05/15/2022 0924 Last data filed at 05/15/2022 0400 Gross per 24 hour  Intake 384.9 ml  Output 3750 ml  Net -3365.1 ml   Filed Weights   05/12/22 0420 05/14/22 0500 05/15/22 0600  Weight: 103.2 kg 102 kg 103 kg    Physical Exam   GEN: obese, in no acute distress.  HEENT: Grossly normal.  Neck: Supple, obese, difficult to gauge JVP, no carotid bruits, or masses. Cardiac: RRR, tachy, no murmurs, rubs, or gallops. No clubbing, cyanosis, 1+ bilat ankle edema.  Radials 2+, DP/PT 2+ and equal bilaterally.  Respiratory:   Respirations regular and unlabored, markedly diminished breath sounds throughout. GI: Obese, soft, nontender, nondistended, BS + x 4. MS: no deformity or atrophy. Skin: warm and dry, no rash. Neuro:  Needs assistance sitting up. Psych: AAOx3.  Normal affect.  Labs    Chemistry Recent Labs  Lab 05/13/22 0706 05/14/22 0559 05/15/22 0529  NA 138 135 140  K 3.5 3.4* 4.2  CL 96* 96* 99  CO2 30 34* 35*  GLUCOSE 114* 156* 113*  BUN 18 25* 25*  CREATININE 0.87 0.87 0.88  CALCIUM 7.8* 7.8* 7.6*  GFRNONAA >60 >60 >60  ANIONGAP '12 5 6     '$ Hematology Recent Labs  Lab 05/13/22 0639 05/14/22 0559 05/15/22 0529  WBC 11.5* 10.8* 9.3  RBC 4.65 4.68 4.58  HGB 13.7 13.6 13.6  HCT 45.1 45.3 45.0  MCV 97.0 96.8 98.3  MCH 29.5 29.1 29.7  MCHC 30.4 30.0 30.2  RDW 13.5 13.7 13.7  PLT 140* 178 157    Cardiac Enzymes  Recent Labs  Lab 04/30/22 0332 04/30/22 0546 04/30/22 1734 04/30/22 1955  TROPONINIHS 373* 1,137* 2,844* 2,210*      BNP    Component Value Date/Time   BNP 971.6 (  H) 04/30/2022 0332    Lipids  Lab Results  Component Value Date   CHOL 141 04/03/2022   HDL 62 04/03/2022   LDLCALC 62 04/03/2022   TRIG 92 04/03/2022   CHOLHDL 2.3 04/03/2022    HbA1c  Lab Results  Component Value Date   HGBA1C 6.9 (H) 04/30/2022    Radiology    DG Knee 1-2 Views Left  Result Date: 05/14/2022 CLINICAL DATA:  Left knee pain without injury EXAM: LEFT KNEE - 1-2 VIEW COMPARISON:  None Available. FINDINGS: Vascular calcification. Tricompartmental degenerative changes with joint space narrowing medially. Probable small to moderate suprapatellar joint effusion. Soft tissue swelling above the knee. No other acute abnormalities. IMPRESSION: Degenerative changes as above. Suprapatellar joint effusion. Vascular calcifications. Electronically Signed   By: Dorise Bullion III M.D.   On: 05/14/2022 11:38   DG Chest Port 1 View  Result Date: 05/13/2022 CLINICAL DATA:  Dyspnea EXAM:  PORTABLE CHEST 1 VIEW COMPARISON:  05/09/2022 FINDINGS: Stable heart size. Aortic atherosclerosis. Low lung volumes. Persistent small bilateral pleural effusions with similar streaky bibasilar opacities, likely atelectasis. No pneumothorax. IMPRESSION: Persistent small bilateral pleural effusions with similar streaky bibasilar opacities, likely atelectasis. Electronically Signed   By: Davina Poke D.O.   On: 05/13/2022 11:08    Telemetry    Sinus rhythm/sinus tachycardia, freq PVCs - Personally Reviewed  Cardiac Studies   Echocardiogram completed on 04/30/2022  1. Left ventricular ejection fraction, by estimation, is 35 to 40%. The  left ventricle has moderately decreased function. The left ventricle  demonstrates global hypokinesis. There is mild left ventricular  hypertrophy. Left ventricular diastolic  parameters are consistent with Grade I diastolic dysfunction (impaired  relaxation).   2. Right ventricular systolic function is moderately reduced. The right  ventricular size is mildly enlarged.   3. The mitral valve is normal in structure. No evidence of mitral valve  regurgitation.   4. The aortic valve is grossly normal. Aortic valve regurgitation is not  visualized.    Echocardiogram completed on 04/20/2022 1. Challenging study, PVCs noted.   2. Left ventricular ejection fraction, by estimation, is 45 to 50%. The  left ventricle has mildly decreased function. The left ventricle has no  regional wall motion abnormalities. There is mild left ventricular  hypertrophy. Left ventricular diastolic  parameters are consistent with Grade I diastolic dysfunction (impaired  relaxation).   3. Right ventricular systolic function is mildly reduced. The right  ventricular size is normal. Tricuspid regurgitation signal is inadequate  for assessing PA pressure.   4. The mitral valve is normal in structure. Mild to moderate mitral valve  regurgitation. No evidence of mitral stenosis.   5.  The aortic valve is normal in structure. Aortic valve regurgitation is  mild. No aortic stenosis is present.   6. There is borderline dilatation of the aortic root, measuring 37 mm.  There is mild dilatation of the ascending aorta, measuring 42 mm.   7. The inferior vena cava is normal in size with greater than 50%  respiratory variability, suggesting right atrial pressure of 3 mmHg.     Patient Profile     76 y.o. male with a history of chronic systolic and diastolic heart failure, PAF, COPD, myasthenia gravis, admitted with PEA arrest in the setting of acute on chronic systolic/diastolic heart failure and hypoxic respiratory failure in the setting of not taking his diuretics.  Hospitalization was complicated by acute on chronic renal failure, pneumonia, and possible myasthenia gravis flare.  Assessment &  Plan    1.  Acute on chronic combined syst/diast CHF:  EF 35-40% by echo this admission.  Increased volume noted on 6/1  Lasix resumed w/ good response.  Lasix transitioned to PO on 6/4.  Denies dyspnea this AM but pCO2 of 62 on ABG last night - has OSA but this is untreated due to claustrophobia, and he did not tolerate BiPAP last night.  Markedly diminished breath sounds on exam w/ 1+ bilat LEE.  Minus 3.3 L yesterday (minus 8.7L for admission), but weight recorded up 1 kg this AM. Renal fxn stable.  I will transition back to IV lasix.  Discussed w/ Dr. Fletcher Anon.  Will defer cath until tomorrow.  Cont ARB and digoxin.  No ? blocker in the setting of myasthenia gravis.  2.  PEA Arrest/NSTEMI:  Presented to the ED 5/21 w/ resp failure and suffered PEA arrest in the setting of hypoxia s/p 8 mins of CPR w/ ROSC.  Has residual chest wall pain, otw stable. Cath tomorrow.  3.  Myasthenia Gravis:  neuro following.  S/p course of IVIG.  On mestinon.  4.  Essential HTN:  relatively stable on ARB.  5.  PAF:  no recurrence since 5/31.  Cont digoxin.  [place] on hold.  Has been on heparin but held yesterday  2/2 hematuria.  Urine tea colored currently.  No frank hematuria at this time.  Signed, Murray Hodgkins, NP  05/15/2022, 9:24 AM    For questions or updates, please contact   Please consult [email or web address] for contact info under Cardiology/STEMI.

## 2022-05-15 NOTE — Assessment & Plan Note (Signed)
Meets criteria BMI greater than 30 and comorbidities of heart failure and hypertension.  Complicates overall prognosis

## 2022-05-15 NOTE — Assessment & Plan Note (Signed)
Blood pressures after extubation were in the 750N systolic.  Weaned off of nitroglycerin drip.  Avoiding beta-blockers.  Continue losartan, Lasix and as needed hydralazine

## 2022-05-15 NOTE — Assessment & Plan Note (Signed)
Patient with chest wall pain.  No evidence of fractures on x-ray.  Pain control, cardiology planning cardiac cath tomorrow

## 2022-05-15 NOTE — H&P (View-Only) (Signed)
Cardiology Progress Note   Patient Name: Joseph Macdonald Date of Encounter: 05/15/2022  Primary Cardiologist: [name], MD  Subjective   No chest pain or sob.  Didn't sleep well - tried to wear BiPAP but didn't tolerate.  Able to lie flat.  Hungry.  Inpatient Medications    Scheduled Meds:  chlorhexidine  15 mL Mouth Rinse BID   digoxin  0.125 mg Oral Daily   feeding supplement  237 mL Oral BID BM   insulin aspart  0-20 Units Subcutaneous TID WC & HS   ipratropium-albuterol  3 mL Nebulization BID   losartan  25 mg Oral Daily   mouth rinse  15 mL Mouth Rinse q12n4p   multivitamin with minerals  1 tablet Oral Daily   nystatin ointment   Topical BID   OLANZapine zydis  5 mg Oral BID   pantoprazole  40 mg Oral Daily   pyridostigmine  30 mg Oral TID   sodium chloride flush  3 mL Intravenous Q12H   sodium chloride flush  3 mL Intravenous Q12H   Continuous Infusions:  sodium chloride Stopped (05/09/22 1434)   PRN Meds: sodium chloride, acetaminophen, atropine, diazepam, hydrALAZINE, HYDROcodone-acetaminophen, polyethylene glycol, sodium chloride flush   Vital Signs    Vitals:   05/14/22 2117 05/14/22 2351 05/15/22 0344 05/15/22 0600  BP:  122/71 140/83   Pulse:  97 (!) 103   Resp:  18 18   Temp:  98.3 F (36.8 C) 98.2 F (36.8 C)   TempSrc:      SpO2: 93% 100% 94%   Weight:    103 kg  Height:        Intake/Output Summary (Last 24 hours) at 05/15/2022 0924 Last data filed at 05/15/2022 0400 Gross per 24 hour  Intake 384.9 ml  Output 3750 ml  Net -3365.1 ml   Filed Weights   05/12/22 0420 05/14/22 0500 05/15/22 0600  Weight: 103.2 kg 102 kg 103 kg    Physical Exam   GEN: obese, in no acute distress.  HEENT: Grossly normal.  Neck: Supple, obese, difficult to gauge JVP, no carotid bruits, or masses. Cardiac: RRR, tachy, no murmurs, rubs, or gallops. No clubbing, cyanosis, 1+ bilat ankle edema.  Radials 2+, DP/PT 2+ and equal bilaterally.  Respiratory:   Respirations regular and unlabored, markedly diminished breath sounds throughout. GI: Obese, soft, nontender, nondistended, BS + x 4. MS: no deformity or atrophy. Skin: warm and dry, no rash. Neuro:  Needs assistance sitting up. Psych: AAOx3.  Normal affect.  Labs    Chemistry Recent Labs  Lab 05/13/22 0706 05/14/22 0559 05/15/22 0529  NA 138 135 140  K 3.5 3.4* 4.2  CL 96* 96* 99  CO2 30 34* 35*  GLUCOSE 114* 156* 113*  BUN 18 25* 25*  CREATININE 0.87 0.87 0.88  CALCIUM 7.8* 7.8* 7.6*  GFRNONAA >60 >60 >60  ANIONGAP '12 5 6     '$ Hematology Recent Labs  Lab 05/13/22 0639 05/14/22 0559 05/15/22 0529  WBC 11.5* 10.8* 9.3  RBC 4.65 4.68 4.58  HGB 13.7 13.6 13.6  HCT 45.1 45.3 45.0  MCV 97.0 96.8 98.3  MCH 29.5 29.1 29.7  MCHC 30.4 30.0 30.2  RDW 13.5 13.7 13.7  PLT 140* 178 157    Cardiac Enzymes  Recent Labs  Lab 04/30/22 0332 04/30/22 0546 04/30/22 1734 04/30/22 1955  TROPONINIHS 373* 1,137* 2,844* 2,210*      BNP    Component Value Date/Time   BNP 971.6 (  H) 04/30/2022 0332    Lipids  Lab Results  Component Value Date   CHOL 141 04/03/2022   HDL 62 04/03/2022   LDLCALC 62 04/03/2022   TRIG 92 04/03/2022   CHOLHDL 2.3 04/03/2022    HbA1c  Lab Results  Component Value Date   HGBA1C 6.9 (H) 04/30/2022    Radiology    DG Knee 1-2 Views Left  Result Date: 05/14/2022 CLINICAL DATA:  Left knee pain without injury EXAM: LEFT KNEE - 1-2 VIEW COMPARISON:  None Available. FINDINGS: Vascular calcification. Tricompartmental degenerative changes with joint space narrowing medially. Probable small to moderate suprapatellar joint effusion. Soft tissue swelling above the knee. No other acute abnormalities. IMPRESSION: Degenerative changes as above. Suprapatellar joint effusion. Vascular calcifications. Electronically Signed   By: Dorise Bullion III M.D.   On: 05/14/2022 11:38   DG Chest Port 1 View  Result Date: 05/13/2022 CLINICAL DATA:  Dyspnea EXAM:  PORTABLE CHEST 1 VIEW COMPARISON:  05/09/2022 FINDINGS: Stable heart size. Aortic atherosclerosis. Low lung volumes. Persistent small bilateral pleural effusions with similar streaky bibasilar opacities, likely atelectasis. No pneumothorax. IMPRESSION: Persistent small bilateral pleural effusions with similar streaky bibasilar opacities, likely atelectasis. Electronically Signed   By: Davina Poke D.O.   On: 05/13/2022 11:08    Telemetry    Sinus rhythm/sinus tachycardia, freq PVCs - Personally Reviewed  Cardiac Studies   Echocardiogram completed on 04/30/2022  1. Left ventricular ejection fraction, by estimation, is 35 to 40%. The  left ventricle has moderately decreased function. The left ventricle  demonstrates global hypokinesis. There is mild left ventricular  hypertrophy. Left ventricular diastolic  parameters are consistent with Grade I diastolic dysfunction (impaired  relaxation).   2. Right ventricular systolic function is moderately reduced. The right  ventricular size is mildly enlarged.   3. The mitral valve is normal in structure. No evidence of mitral valve  regurgitation.   4. The aortic valve is grossly normal. Aortic valve regurgitation is not  visualized.    Echocardiogram completed on 04/20/2022 1. Challenging study, PVCs noted.   2. Left ventricular ejection fraction, by estimation, is 45 to 50%. The  left ventricle has mildly decreased function. The left ventricle has no  regional wall motion abnormalities. There is mild left ventricular  hypertrophy. Left ventricular diastolic  parameters are consistent with Grade I diastolic dysfunction (impaired  relaxation).   3. Right ventricular systolic function is mildly reduced. The right  ventricular size is normal. Tricuspid regurgitation signal is inadequate  for assessing PA pressure.   4. The mitral valve is normal in structure. Mild to moderate mitral valve  regurgitation. No evidence of mitral stenosis.   5.  The aortic valve is normal in structure. Aortic valve regurgitation is  mild. No aortic stenosis is present.   6. There is borderline dilatation of the aortic root, measuring 37 mm.  There is mild dilatation of the ascending aorta, measuring 42 mm.   7. The inferior vena cava is normal in size with greater than 50%  respiratory variability, suggesting right atrial pressure of 3 mmHg.     Patient Profile     76 y.o. male with a history of chronic systolic and diastolic heart failure, PAF, COPD, myasthenia gravis, admitted with PEA arrest in the setting of acute on chronic systolic/diastolic heart failure and hypoxic respiratory failure in the setting of not taking his diuretics.  Hospitalization was complicated by acute on chronic renal failure, pneumonia, and possible myasthenia gravis flare.  Assessment &  Plan    1.  Acute on chronic combined syst/diast CHF:  EF 35-40% by echo this admission.  Increased volume noted on 6/1  Lasix resumed w/ good response.  Lasix transitioned to PO on 6/4.  Denies dyspnea this AM but pCO2 of 62 on ABG last night - has OSA but this is untreated due to claustrophobia, and he did not tolerate BiPAP last night.  Markedly diminished breath sounds on exam w/ 1+ bilat LEE.  Minus 3.3 L yesterday (minus 8.7L for admission), but weight recorded up 1 kg this AM. Renal fxn stable.  I will transition back to IV lasix.  Discussed w/ Dr. Fletcher Anon.  Will defer cath until tomorrow.  Cont ARB and digoxin.  No ? blocker in the setting of myasthenia gravis.  2.  PEA Arrest/NSTEMI:  Presented to the ED 5/21 w/ resp failure and suffered PEA arrest in the setting of hypoxia s/p 8 mins of CPR w/ ROSC.  Has residual chest wall pain, otw stable. Cath tomorrow.  3.  Myasthenia Gravis:  neuro following.  S/p course of IVIG.  On mestinon.  4.  Essential HTN:  relatively stable on ARB.  5.  PAF:  no recurrence since 5/31.  Cont digoxin.  [place] on hold.  Has been on heparin but held yesterday  2/2 hematuria.  Urine tea colored currently.  No frank hematuria at this time.  Signed, Murray Hodgkins, NP  05/15/2022, 9:24 AM    For questions or updates, please contact   Please consult [email or web address] for contact info under Cardiology/STEMI.

## 2022-05-15 NOTE — Assessment & Plan Note (Signed)
In the setting of heart failure exacerbation as well as pneumonia.  Status post ventilator support.  Continue steroids, antibiotics and nebulizers.  Pulmonary following.  Improved.  Patient is down to 2 L nasal cannula.  Still with some overload as when he was attempted to have cardiac catheterization today, became too short of breath.

## 2022-05-15 NOTE — Progress Notes (Signed)
Progress Note   Patient: Joseph Macdonald SMO:[id number] DOB: [birth year] DOA: 04/30/2022     15 DOS: the patient was seen and examined on 05/15/2022   Brief hospital course: 76 year old male with past medical history of morbid obesity, myasthenia gravis, COPD, systolic/diastolic CHF, diabetes mellitus and atrial fibrillation on Eliquis presented to the emergency room on 5/21 with hypoxia requiring nonrebreather and then suffered a cardiac arrest upon arrival to the emergency room.  Patient able to be resuscitated.  CT scan revealed aspiration pneumonia.  Patient admitted to the critical care service and placed on ventilator support.  Initially extubated 5/24 but reintubated same day.  Underwent bronchoscopy removing bloody mucous plugs.  Able to be extubated on 5/27.  Patient transferred out of ICU on 5/29.  Cardiology, neurology and palliative care have been following as well.  Patient also looks to be having an acute flareup of his myasthenia gravis and was started on IVIG as of 5/31.  With IVIG, patient has also been started on aggressive diuresis and is currently diuresed over 19 L and is more than 8.5 L deficient.  Plan is for cardiac catheterization.   Assessment and Plan: * Cardiac arrest due to respiratory disorder [facility]) Patient with chest wall pain.  No evidence of fractures on x-ray.  Pain control, cardiology planning cardiac cath tomorrow  Acute respiratory failure with hypoxia (HCC) In the setting of heart failure exacerbation as well as pneumonia.  Status post ventilator support.  Continue steroids, antibiotics and nebulizers.  Pulmonary following.  Improved.  Patient is down to 2 L nasal cannula.  Still with some overload as when he was attempted to have cardiac catheterization today, became too short of breath.  Acute on chronic HFrEF (heart failure with reduced ejection fraction) [facility]) Cardiology following.  Echocardiogram done 5/21 notes ejection fraction of 35 to 40% and grade 1  diastolic dysfunction as well as moderately reduced right ventricular systolic function.  Initially held further diuresis in the setting of hyponatremia.  No beta-blockers given myasthenia gravis.  Restarted on IV Lasix twice daily which will be stopped following completion of IVIG.  Patient has responded Diuresed well is - -8.5 L deficient.  Unable to do cardiac catheterization because patient could not tolerate lying flat so we will continue to diurese.    Net IO Since Admission: -17,773.53 mL [05/15/22 1630]  Myasthenia gravis [facility]) Neurology following.  IVIG completed although patient with still some residual weakness and not felt to be related to myasthenia gravis.  Okay by neurology to also start Mestinon.  Could be global deconditioning instead  Hypertensive emergency Blood pressures after extubation were in the 920F systolic.  Weaned off of nitroglycerin drip.  Avoiding beta-blockers.  Continue losartan, Lasix and as needed hydralazine  Paroxysmal atrial fibrillation (HCC) Currently in sinus rhythm.  Continue digoxin.  Holding Eliquis.  Was on heparin drip but on hold due to hematuria.  Resume heparin if hematuria improves or slows down later tonight or first in the morning  Pressure injury of skin Pressure Injury Buttocks Right Stage 2 -  Partial thickness loss of dermis presenting as a shallow open injury with a red, pink wound bed without slough. (Active)     Location: Buttocks  Location Orientation: Right  Staging: Stage 2 -  Partial thickness loss of dermis presenting as a shallow open injury with a red, pink wound bed without slough.  Wound Description (Comments):   Present on Admission: No   Stage II right buttock ulcer, not present  on admission.  Wound care per wound care nurse   Morbid obesity ([place]) Meets criteria BMI greater than 30 and comorbidities of heart failure and hypertension.  Complicates overall prognosis        Subjective: Fatigue.  Felt little short of  breath when he went downstairs for cardiac catheterization Physical Exam: Vitals:   05/14/22 2351 05/15/22 0344 05/15/22 0600 05/15/22 1310  BP: 122/71 140/83  133/60  Pulse: 97 (!) 103  (!) 111  Resp: '18 18  20  '$ Temp: 98.3 F (36.8 C) 98.2 F (36.8 C)  98.3 F (36.8 C)  TempSrc:      SpO2: 100% 94%  (!) 87%  Weight:   103 kg   Height:       General: Fatigued, no acute distress HEENT: Normocephalic, atraumatic, mucous membranes are slightly dry.  Narrow airway Cardiovascular: Regular rhythm Z6-X0, 2/6 systolic ejection murmur, borderline tachycardia Respiratory: Decreased breath sounds throughout Abdomen: Soft, obese, nontender, positive bowel sounds Musculoskeletal: Noted chest wall tenderness.  Hematoma/ecchymosis below right knee area which is also tender to touch-improving Skin: No skin breaks, tears or lesions Psychiatry: Appropriate, no evidence of psychoses  Data Reviewed:  Electrolytes and hemoglobin stable  Family Communication: Left message for family  Disposition: Status is: Inpatient Remains inpatient appropriate because: Cardiac catheterization, rescheduled for tomorrow   Planned Discharge Destination: Home with Home Health    DVT prophylaxis- SCDs.  Heparin on hold as he had hematuria  Author: Annita Brod, MD 05/15/2022 4:34 PM  For on call review [email or web address].

## 2022-05-15 NOTE — Progress Notes (Signed)
Physical Therapy Treatment Patient Details Name: Joseph Macdonald MRN: [id number] DOB: Sep 14, 1946 Today's Date: 05/15/2022   History of Present Illness Joseph Macdonald is a 76 yo M presenting to [facility] ED via EMS from home on 04/30/22 in respiratory distress. Per documentation patient was found by EMS hypoxic with SpO2 71%-88% placed on NRB with albuterol administered. Patient received CPR for 8 minutes in ED with no defibrillations, and was emergently intubated requiring mechanical ventilatory support now extubated. MD assessment includes: Severe ACUTE Hypoxic and Hypercapnic Respiratory Failure, HCAP, acute systolic cardiac failure, Myasthenia Gravis query crisis, AKI, and elevated troponin in setting of demand ischemia.    PT Comments    Pt was very lethargic upon entering room but agreeable to participate with PT. Pt's alertness increased as session progressed. Pt required extra time and effort moving from supine to sit and needed mod A for leg management and trunk control as well as verbal cuing for hand placement. With extensive +2 assistance pt was able to perform sit to stand with bed at lowest setting.The bed was then elevated which allowed pt to perform sit to stand mod A +1 and required heavy cuing for sequencing throughout movement. SPO2 remained >=90 and HR as high as low 120s throughout session. Session ended early due pt BM and left with nursing for hygiene management. Pt will benefit from PT services in a SNF setting upon discharge to safely address deficits listed in patient problem list for decreased caregiver assistance and eventual return to PLOF.   Recommendations for follow up therapy are one component of a multi-disciplinary discharge planning process, led by the attending physician.  Recommendations may be updated based on patient status, additional functional criteria and insurance authorization.  Follow Up Recommendations  Skilled nursing-short term rehab (<3 hours/day)      Assistance Recommended at Discharge Frequent or constant Supervision/Assistance  Patient can return home with the following Two people to help with walking and/or transfers;Two people to help with bathing/dressing/bathroom;Assistance with cooking/housework;Help with stairs or ramp for entrance;Assist for transportation   Equipment Recommendations  Rolling walker (2 wheels);Wheelchair (measurements PT);Wheelchair cushion (measurements PT)    Recommendations for Other Services       Precautions / Restrictions Precautions Precautions: Fall Restrictions Weight Bearing Restrictions: No     Mobility  Bed Mobility Overal bed mobility: Needs Assistance Bed Mobility: Sit to Supine Rolling: Mod assist   Supine to sit: Mod assist Sit to supine: Mod assist   General bed mobility comments: Pt required cuing for sequencing and needing extra time and effort    Transfers Overall transfer level: Needs assistance Equipment used: Rolling walker (2 wheels)   Sit to Stand: Mod assist           General transfer comment: Pt requiring verbal cuing for hand placement and need bed elevated to initiate stand    Ambulation/Gait                   Stairs             Wheelchair Mobility    Modified Rankin (Stroke Patients Only)       Balance Overall balance assessment: Needs assistance Sitting-balance support: Feet supported, Bilateral upper extremity supported Sitting balance-Leahy Scale: Poor     Standing balance support: Reliant on assistive device for balance Standing balance-Leahy Scale: Poor  Cognition Arousal/Alertness: Lethargic (Increased in alertness as session progressed) Behavior During Therapy: WFL for tasks assessed/performed Overall Cognitive Status: Within Functional Limits for tasks assessed                                          Exercises      General Comments General comments (skin  integrity, edema, etc.): Small areas of redness w/ circular pattern along R groin, nursing notified.      Pertinent Vitals/Pain Pain Assessment Pain Assessment: 0-10 Pain Score: 8  Pain Location: general pain throughout LLE Pain Descriptors / Indicators: Guarding, Aching Pain Intervention(s): Monitored during session    Home Living                          Prior Function            PT Goals (current goals can now be found in the care plan section) Acute Rehab PT Goals Patient Stated Goal: To walk better with better endurance PT Goal Formulation: With patient Time For Goal Achievement: 05/21/22 Potential to Achieve Goals: Fair Progress towards PT goals: Not progressing toward goals - comment    Frequency    Min 2X/week      PT Plan Current plan remains appropriate    Co-evaluation              AM-PAC PT "6 Clicks" Mobility   Outcome Measure  Help needed turning from your back to your side while in a flat bed without using bedrails?: A Lot Help needed moving from lying on your back to sitting on the side of a flat bed without using bedrails?: A Lot Help needed moving to and from a bed to a chair (including a wheelchair)?: A Lot Help needed standing up from a chair using your arms (e.g., wheelchair or bedside chair)?: A Lot Help needed to walk in hospital room?: Total Help needed climbing 3-5 steps with a railing? : Total 6 Click Score: 10    End of Session Equipment Utilized During Treatment: Gait belt Activity Tolerance: Patient limited by fatigue;Patient limited by lethargy Patient left: in bed;with nursing/sitter in room;Other (comment) (for hygiene management) Nurse Communication: Mobility status PT Visit Diagnosis: Muscle weakness (generalized) (M62.81);Difficulty in walking, not elsewhere classified (R26.2);Unsteadiness on feet (R26.81) Pain - Right/Left: Left Pain - part of body: Leg     Time: 1501-1520 PT Time Calculation (min) (ACUTE  ONLY): 19 min  Charges:  $Therapeutic Activity: 8-22 mins                     Turner Daniels, SPT  05/15/2022, 4:41 PM

## 2022-05-15 NOTE — Assessment & Plan Note (Signed)
Cardiology following.  Echocardiogram done 5/21 notes ejection fraction of 35 to 40% and grade 1 diastolic dysfunction as well as moderately reduced right ventricular systolic function.  Initially held further diuresis in the setting of hyponatremia.  No beta-blockers given myasthenia gravis.  Restarted on IV Lasix twice daily which will be stopped following completion of IVIG.  Patient has responded Diuresed well is - -8.5 L deficient.  Unable to do cardiac catheterization because patient could not tolerate lying flat so we will continue to diurese.    Net IO Since Admission: -17,773.53 mL [05/15/22 1630]

## 2022-05-15 NOTE — Plan of Care (Signed)
Neurology plan of care  Please see Dr. Yvetta Coder progress note from yesterday. As he discussed, patient had no improvement from IVIG, and we favor his weakness to be multifactorial 2/2 CHF, deconditioning and primary pulmonary etiologies. Encephalopathy yesterday attributable to worsening hypercarbia. If palliative is not already involved it would appropriate to consult them if family is amenable. No further neuro specific workup or tx indicated at this time other than avoiding drugs known to exacerbate myasthenia. Neurology will sign off, but please re-engage if additional neurologic questions arise.  Medications that may worsen or trigger MG exacerbation: Class IA antiarrhythmics, magnesium, flouroquinolones, macrolides, aminoglycosides, penicillamine, curare, interferon alpha, botox, quinine. Use with caution: CCBs, BBs, statins.   Su Monks, MD [facility] [phone]  If 7pm- 7am, please page neurology on call as listed in [place].

## 2022-05-15 NOTE — Progress Notes (Addendum)
Patient is NPO as of midnight 00:01 on 06/ 06/ 2023 due to Cath scheduled for 1pm

## 2022-05-15 NOTE — Care Management Important Message (Signed)
Important Message  Patient Details  Name: Joseph Macdonald MRN: [id number] Date of Birth: 12-01-1946   Medicare Important Message Given:  Yes     Dannette Barbara 05/15/2022, 12:14 PM

## 2022-05-16 ENCOUNTER — Encounter
Admission: EM | Disposition: A | Discharge status: Home / Self Care | Payer: Self-pay | Source: Home / Self Care | Attending: Internal Medicine

## 2022-05-16 DIAGNOSIS — I5023 Acute on chronic systolic (congestive) heart failure: Secondary | ICD-10-CM | POA: Diagnosis not present

## 2022-05-16 DIAGNOSIS — I2510 Atherosclerotic heart disease of native coronary artery without angina pectoris: Secondary | ICD-10-CM | POA: Insufficient documentation

## 2022-05-16 DIAGNOSIS — I468 Cardiac arrest due to other underlying condition: Secondary | ICD-10-CM | POA: Diagnosis not present

## 2022-05-16 DIAGNOSIS — G7000 Myasthenia gravis without (acute) exacerbation: Secondary | ICD-10-CM | POA: Diagnosis not present

## 2022-05-16 DIAGNOSIS — J989 Respiratory disorder, unspecified: Secondary | ICD-10-CM | POA: Diagnosis not present

## 2022-05-16 HISTORY — PX: INTRAVASCULAR PRESSURE WIRE/FFR STUDY: CATH118243

## 2022-05-16 HISTORY — PX: RIGHT/LEFT HEART CATH AND CORONARY ANGIOGRAPHY: CATH118266

## 2022-05-16 LAB — BASIC METABOLIC PANEL
Anion gap: 5 (ref 5–15)
BUN: 30 mg/dL — ABNORMAL HIGH (ref 8–23)
CO2: 39 mmol/L — ABNORMAL HIGH (ref 22–32)
Calcium: 8.4 mg/dL — ABNORMAL LOW (ref 8.9–10.3)
Chloride: 96 mmol/L — ABNORMAL LOW (ref 98–111)
Creatinine, Ser: 0.85 mg/dL (ref 0.61–1.24)
GFR, Estimated: >60 mL/min (ref >60)
Glucose, Bld: 108 mg/dL — ABNORMAL HIGH (ref 70–99)
Potassium: 4.3 mmol/L (ref 3.5–5.1)
Sodium: 140 mmol/L (ref 135–145)

## 2022-05-16 LAB — GLUCOSE, CAPILLARY
Glucose-Capillary: 93 mg/dL (ref 70–99)
Glucose-Capillary: 114 mg/dL — ABNORMAL HIGH (ref 70–99)
Glucose-Capillary: 124 mg/dL — ABNORMAL HIGH (ref 70–99)
Glucose-Capillary: 122 mg/dL — ABNORMAL HIGH (ref 70–99)
Glucose-Capillary: 246 mg/dL — ABNORMAL HIGH (ref 70–99)
Glucose-Capillary: 171 mg/dL — ABNORMAL HIGH (ref 70–99)

## 2022-05-16 LAB — POCT ACTIVATED CLOTTING TIME: Activated Clotting Time: 311 seconds

## 2022-05-16 SURGERY — RIGHT/LEFT HEART CATH AND CORONARY ANGIOGRAPHY
Anesthesia: Moderate Sedation

## 2022-05-16 MED ORDER — HEPARIN (PORCINE) IN NACL 1000-0.9 UT/500ML-% IV SOLN
INTRAVENOUS | Status: AC
  Filled 2022-05-16: qty 500

## 2022-05-16 MED ORDER — LIDOCAINE HCL (PF) 1 % IJ SOLN
INTRAMUSCULAR | Status: DC | PRN
  Administered 2022-05-16 (×2): 2 mL

## 2022-05-16 MED ORDER — HEPARIN SODIUM (PORCINE) 1000 UNIT/ML IJ SOLN
INTRAMUSCULAR | Status: AC
  Filled 2022-05-16: qty 10

## 2022-05-16 MED ORDER — HEPARIN SODIUM (PORCINE) 1000 UNIT/ML IJ SOLN
INTRAMUSCULAR | Status: DC | PRN
  Administered 2022-05-16: 8000 [IU] via INTRAVENOUS

## 2022-05-16 MED ORDER — VERAPAMIL HCL 2.5 MG/ML IV SOLN
INTRAVENOUS | Status: DC | PRN
  Administered 2022-05-16: 2.5 mg via INTRA_ARTERIAL

## 2022-05-16 MED ORDER — SODIUM CHLORIDE 0.9 % IV SOLN: 250.0000 mL | INTRAVENOUS | Status: DC | PRN

## 2022-05-16 MED ORDER — HYDRALAZINE HCL 20 MG/ML IJ SOLN: 10.0000 mg | INTRAMUSCULAR | Status: AC | PRN

## 2022-05-16 MED ORDER — IOHEXOL 350 MG/ML SOLN
INTRAVENOUS | Status: DC | PRN
  Administered 2022-05-16: 57 mL

## 2022-05-16 MED ORDER — HEPARIN (PORCINE) IN NACL 1000-0.9 UT/500ML-% IV SOLN
INTRAVENOUS | Status: DC | PRN
  Administered 2022-05-16 (×2): 500 mL

## 2022-05-16 MED ORDER — SODIUM CHLORIDE 0.9% FLUSH: 3.0000 mL | INTRAVENOUS | Status: DC | PRN

## 2022-05-16 MED ORDER — FUROSEMIDE 40 MG PO TABS
40.0000 mg | ORAL_TABLET | Freq: Two times a day (BID) | ORAL | Status: DC
  Administered 2022-05-16 – 2022-05-18 (×4): 40 mg via ORAL
  Filled 2022-05-16 (×5): qty 1

## 2022-05-16 MED ORDER — LIDOCAINE HCL 1 % IJ SOLN
INTRAMUSCULAR | Status: AC
  Filled 2022-05-16: qty 20

## 2022-05-16 MED ORDER — VERAPAMIL HCL 2.5 MG/ML IV SOLN
INTRAVENOUS | Status: AC
  Filled 2022-05-16: qty 2

## 2022-05-16 MED ORDER — SODIUM CHLORIDE 0.9% FLUSH
3.0000 mL | Freq: Two times a day (BID) | INTRAVENOUS | Status: DC
  Administered 2022-05-16 – 2022-05-18 (×4): 3 mL via INTRAVENOUS

## 2022-05-16 SURGICAL SUPPLY — 16 items
CATH 5F 110X4 TIG (CATHETERS) ×1 IMPLANT
CATH BALLN WEDGE 5F 110CM (CATHETERS) ×1 IMPLANT
CATH LAUNCHER 6FR EBU3.5 (CATHETERS) ×1 IMPLANT
COVER PROBE U/S 5X48 (MISCELLANEOUS) ×1 IMPLANT
DEVICE RAD COMP TR BAND LRG (VASCULAR PRODUCTS) ×1 IMPLANT
DRAPE BRACHIAL (DRAPES) ×2 IMPLANT
GLIDESHEATH SLEND A-KIT 6F 22G (SHEATH) ×1 IMPLANT
GUIDEWIRE INQWIRE 1.5J.035X260 (WIRE) IMPLANT
GUIDEWIRE PRESS OMNI 185 ST (WIRE) ×1 IMPLANT
INQWIRE 1.5J .035X260CM (WIRE) ×2
KIT ENCORE 26 ADVANTAGE (KITS) ×1 IMPLANT
PACK CARDIAC CATH (CUSTOM PROCEDURE TRAY) ×2 IMPLANT
PROTECTION STATION PRESSURIZED (MISCELLANEOUS) ×2
SET ATX SIMPLICITY (MISCELLANEOUS) ×1 IMPLANT
SHEATH GLIDE SLENDER 4/5FR (SHEATH) ×1 IMPLANT
STATION PROTECTION PRESSURIZED (MISCELLANEOUS) IMPLANT

## 2022-05-16 NOTE — Assessment & Plan Note (Signed)
Cardiology following.  Echocardiogram done 5/21 notes ejection fraction of 35 to 40% and grade 1 diastolic dysfunction as well as moderately reduced right ventricular systolic function.  Initially held further diuresis in the setting of hyponatremia.  No beta-blockers given myasthenia gravis.  Restarted on IV Lasix twice daily which will be stopped following completion of IVIG.  Patient has responded to diuresis, diuresing over 15 L and is almost -5 L deficient.  He was unable to do cardiac catheterization on 6/5 because patient could not tolerate lying flat, but hopefully will be able to tolerate on 6/6

## 2022-05-16 NOTE — Progress Notes (Signed)
Consent form previously completed in chart. Consent form for patient's left and right heart cath at 1 pm today.  Patient is currently NPO pending procedure.

## 2022-05-16 NOTE — Progress Notes (Signed)
Normal Saline at 56m/hr continuous

## 2022-05-16 NOTE — Progress Notes (Signed)
Occupational Therapy Treatment Patient Details Name: Joseph Macdonald MRN: [id number] DOB: 27-Jan-1946 Today's Date: 05/16/2022   History of present illness Joseph Macdonald is a 76 yo M presenting to [facility] ED via EMS from home on 04/30/22 in respiratory distress. Per documentation patient was found by EMS hypoxic with SpO2 71%-88% placed on NRB with albuterol administered. Patient received CPR for 8 minutes in ED with no defibrillations, and was emergently intubated requiring mechanical ventilatory support now extubated. MD assessment includes: Severe ACUTE Hypoxic and Hypercapnic Respiratory Failure, HCAP, acute systolic cardiac failure, Myasthenia Gravis query crisis, AKI, and elevated troponin in setting of demand ischemia.   OT comments  Pt seen for OT tx, limited to bed level grooming due to significant L knee pain. RN notified promptly and in room at end of session to address. Pt required assist for set up and assist for repositioning for improved posture in bed to facilitate BUE involvement into the tasks. Pt noted with L lateral lean in bed and pt reports this is for pressure relief. Pt/spouse educated in bed mobility for pressure relief and strategies to support pressure relief. Pt/spouse verbalized understanding. Pt has L/R heart cath scheduled for 1pm today. Will continue to progress pt towards OT goals.    Recommendations for follow up therapy are one component of a multi-disciplinary discharge planning process, led by the attending physician.  Recommendations may be updated based on patient status, additional functional criteria and insurance authorization.    Follow Up Recommendations  Skilled nursing-short term rehab (<3 hours/day)    Assistance Recommended at Discharge Intermittent Supervision/Assistance  Patient can return home with the following  Two people to help with walking and/or transfers;Two people to help with bathing/dressing/bathroom;Assistance with cooking/housework;Assist for  transportation;Help with stairs or ramp for entrance   Equipment Recommendations  Other (comment) (defer to next venue)    Recommendations for Other Services      Precautions / Restrictions Precautions Precautions: Fall Restrictions Weight Bearing Restrictions: No       Mobility Bed Mobility               General bed mobility comments: deferred 2/2 significant L knee pain    Transfers                         Balance     Sitting balance-Leahy Scale: Poor Sitting balance - Comments: L lateral lean in bed, pt endorsing attempts to reposition for comfort and pressure relief                                   ADL either performed or assessed with clinical judgement   ADL Overall ADL's : Needs assistance/impaired     Grooming: Oral care;Set up;Bed level Grooming Details (indicate cue type and reason): assist to open toothpaste, assist for positioning for improved upright posture to facilitate BUE involvement in preparing toothbrush                                    Extremity/Trunk Assessment              Vision       Perception     Praxis      Cognition Arousal/Alertness: Awake/alert Behavior During Therapy: WFL for tasks assessed/performed Overall Cognitive Status: Within Functional Limits for tasks assessed  Exercises Other Exercises Other Exercises: Pt/spouse educated in bed mobility for pressure relief and strategies to support pressure relief    Shoulder Instructions       General Comments      Pertinent Vitals/ Pain       Pain Assessment Pain Assessment: 0-10 Pain Score: 8  Pain Location: L knee at rest Pain Descriptors / Indicators: Guarding, Aching Pain Intervention(s): Limited activity within patient's tolerance, Monitored during session, Repositioned, Patient requesting pain meds-RN notified  Home Living                                           Prior Functioning/Environment              Frequency  Min 2X/week        Progress Toward Goals  OT Goals(current goals can now be found in the care plan section)  Progress towards OT goals: Progressing toward goals  Acute Rehab OT Goals Patient Stated Goal: to regain strength OT Goal Formulation: With patient Time For Goal Achievement: 05/22/22 Potential to Achieve Goals: Good  Plan Discharge plan remains appropriate;Frequency remains appropriate    Co-evaluation                 AM-PAC OT "6 Clicks" Daily Activity     Outcome Measure   Help from another person eating meals?: None Help from another person taking care of personal grooming?: A Little Help from another person toileting, which includes using toliet, bedpan, or urinal?: A Lot Help from another person bathing (including washing, rinsing, drying)?: A Lot Help from another person to put on and taking off regular upper body clothing?: A Little Help from another person to put on and taking off regular lower body clothing?: A Lot 6 Click Score: 16    End of Session Equipment Utilized During Treatment: Oxygen  OT Visit Diagnosis: Unsteadiness on feet (R26.81);Muscle weakness (generalized) (M62.81);Pain Pain - Right/Left: Left Pain - part of body: Knee   Activity Tolerance Patient limited by pain   Patient Left in bed;with call bell/phone within reach;with bed alarm set;with family/visitor present;with nursing/sitter in room   Nurse Communication          Time: 0786-7544 OT Time Calculation (min): 12 min  Charges: OT General Charges $OT Visit: 1 Visit OT Treatments $Self Care/Home Management : 8-22 mins  Ardeth Perfect., MPH, MS, OTR/L ascom [phone] 05/16/22, 11:48 AM

## 2022-05-16 NOTE — Interval H&P Note (Signed)
History and Physical Interval Note:  05/16/2022 1:43 PM  Joseph Macdonald  has presented today for surgery, with the diagnosis of cardiac arrest; systolic heart failure.  The various methods of treatment have been discussed with the patient and family. After consideration of risks, benefits and other options for treatment, the patient has consented to  Procedure(s): RIGHT/LEFT HEART CATH AND CORONARY ANGIOGRAPHY (N/A) as a surgical intervention.  The patient's history has been reviewed, patient examined, no change in status, stable for surgery.  I have reviewed the patient's chart and labs.  Questions were answered to the patient's satisfaction.    Cath Lab Visit (complete for each Cath Lab visit)  Clinical Evaluation Leading to the Procedure:   ACS: Yes.    Non-ACS:  N/A  [name]

## 2022-05-16 NOTE — Assessment & Plan Note (Signed)
In the setting of heart failure exacerbation as well as pneumonia.  Status post ventilator support.  Continue steroids, antibiotics and nebulizers.  Pulmonary following.  Improved.  Patient is down to 2-3 L nasal cannula.  Hopefully can tolerate cardiac catheterization and laying flat on his back

## 2022-05-16 NOTE — Progress Notes (Signed)
Patient back from cath, no sign of acute distress noted. No sign of bleeding noted on right radial and right brachial at this time.

## 2022-05-16 NOTE — Assessment & Plan Note (Signed)
Neurology following.  Patient with history of myasthenia gravis and weakness thought to be flare, so has been put on Mestinon started course of IVIG.  Despite completion of IVIG, still some residual weakness so not felt to be a myasthenia gravis flare.  Mestinon also discontinued.  Possibly global deconditioning.

## 2022-05-16 NOTE — Progress Notes (Signed)
Cardiology Progress Note   Patient Name: Joseph Macdonald Date of Encounter: 05/16/2022  Primary Cardiologist: [name], MD  Subjective   No c/p or sob.  Still weak and notes double vision - doesn't think IVIG changed much for him.  NPO for cath today - hungry.  C/o significant L knee pain and swelling since Sunday.  Inpatient Medications    Scheduled Meds:  aspirin  81 mg Oral Pre-Cath   chlorhexidine  15 mL Mouth Rinse BID   digoxin  0.125 mg Oral Daily   feeding supplement  237 mL Oral BID BM   furosemide  40 mg Intravenous BID   insulin aspart  0-20 Units Subcutaneous TID WC & HS   ipratropium-albuterol  3 mL Nebulization BID   losartan  25 mg Oral Daily   mouth rinse  15 mL Mouth Rinse q12n4p   metoprolol tartrate  25 mg Oral BID   multivitamin with minerals  1 tablet Oral Daily   nystatin ointment   Topical BID   OLANZapine zydis  5 mg Oral BID   pantoprazole  40 mg Oral Daily   pyridostigmine  30 mg Oral TID   sodium chloride flush  3 mL Intravenous Q12H   sodium chloride flush  3 mL Intravenous Q12H   Continuous Infusions:  sodium chloride Stopped (05/09/22 1434)   sodium chloride     sodium chloride 10 mL/hr at 05/16/22 0654   PRN Meds: sodium chloride, sodium chloride, acetaminophen, atropine, diazepam, hydrALAZINE, HYDROcodone-acetaminophen, polyethylene glycol, sodium chloride flush, sodium chloride flush   Vital Signs    Vitals:   05/15/22 2317 05/16/22 0053 05/16/22 0428 05/16/22 0500  BP:  110/61 (!) 113/56   Pulse:  78 73   Resp:  19 16   Temp:  98.8 F (37.1 C) 97.8 F (36.6 C)   TempSrc:      SpO2:  95% 96%   Weight: 104.3 kg   104.3 kg  Height:        Intake/Output Summary (Last 24 hours) at 05/16/2022 0754 Last data filed at 05/16/2022 0654 Gross per 24 hour  Intake 1295.22 ml  Output 700 ml  Net 595.22 ml   Filed Weights   05/15/22 0600 05/15/22 2317 05/16/22 0500  Weight: 103 kg 104.3 kg 104.3 kg    Physical Exam   GEN:  Obese, in no acute distress.  HEENT: Grossly normal.  Neck: Supple, obese, difficult to gauge JVP.  No carotid bruits, or masses. Cardiac: RRR, no murmurs, rubs, or gallops. No clubbing, cyanosis, trace bilat LE edema.  Radials 2+, DP/PT 2+ and equal bilaterally.  Respiratory:  Respirations regular and unlabored, diminished breath sounds bilat. GI: Obese, soft, nontender, nondistended, BS + x 4. MS: L knee is swollen and tender to light touch.  ROM very limited by pain. Skin: warm and dry, no rash. Neuro:  Strength and sensation are intact. Psych: AAOx3.  Normal affect.  Labs    Chemistry Recent Labs  Lab 05/13/22 0706 05/14/22 0559 05/15/22 0529  NA 138 135 140  K 3.5 3.4* 4.2  CL 96* 96* 99  CO2 30 34* 35*  GLUCOSE 114* 156* 113*  BUN 18 25* 25*  CREATININE 0.87 0.87 0.88  CALCIUM 7.8* 7.8* 7.6*  GFRNONAA >60 >60 >60  ANIONGAP '12 5 6     '$ Hematology Recent Labs  Lab 05/13/22 0639 05/14/22 0559 05/15/22 0529  WBC 11.5* 10.8* 9.3  RBC 4.65 4.68 4.58  HGB 13.7 13.6 13.6  HCT 45.1 45.3 45.0  MCV 97.0 96.8 98.3  MCH 29.5 29.1 29.7  MCHC 30.4 30.0 30.2  RDW 13.5 13.7 13.7  PLT 140* 178 157    Cardiac Enzymes  Recent Labs  Lab 04/30/22 0332 04/30/22 0546 04/30/22 1734 04/30/22 1955  TROPONINIHS 373* 1,137* 2,844* 2,210*      BNP    Component Value Date/Time   BNP 971.6 (H) 04/30/2022 0332    Lipids  Lab Results  Component Value Date   CHOL 141 04/03/2022   HDL 62 04/03/2022   LDLCALC 62 04/03/2022   TRIG 92 04/03/2022   CHOLHDL 2.3 04/03/2022    HbA1c  Lab Results  Component Value Date   HGBA1C 6.9 (H) 04/30/2022    Radiology    DG Knee 1-2 Views Left  Result Date: 05/14/2022 CLINICAL DATA:  Left knee pain without injury EXAM: LEFT KNEE - 1-2 VIEW COMPARISON:  None Available. FINDINGS: Vascular calcification. Tricompartmental degenerative changes with joint space narrowing medially. Probable small to moderate suprapatellar joint effusion.  Soft tissue swelling above the knee. No other acute abnormalities. IMPRESSION: Degenerative changes as above. Suprapatellar joint effusion. Vascular calcifications. Electronically Signed   By: Dorise Bullion III M.D.   On: 05/14/2022 11:38   DG Chest Port 1 View  Result Date: 05/13/2022 CLINICAL DATA:  Dyspnea EXAM: PORTABLE CHEST 1 VIEW COMPARISON:  05/09/2022 FINDINGS: Stable heart size. Aortic atherosclerosis. Low lung volumes. Persistent small bilateral pleural effusions with similar streaky bibasilar opacities, likely atelectasis. No pneumothorax. IMPRESSION: Persistent small bilateral pleural effusions with similar streaky bibasilar opacities, likely atelectasis. Electronically Signed   By: Davina Poke D.O.   On: 05/13/2022 11:08    Telemetry    RSR, PVCs - Personally Reviewed  Cardiac Studies   Echocardiogram completed on 04/30/2022  1. Left ventricular ejection fraction, by estimation, is 35 to 40%. The  left ventricle has moderately decreased function. The left ventricle  demonstrates global hypokinesis. There is mild left ventricular  hypertrophy. Left ventricular diastolic  parameters are consistent with Grade I diastolic dysfunction (impaired  relaxation).   2. Right ventricular systolic function is moderately reduced. The right  ventricular size is mildly enlarged.   3. The mitral valve is normal in structure. No evidence of mitral valve  regurgitation.   4. The aortic valve is grossly normal. Aortic valve regurgitation is not  visualized.    Echocardiogram completed on 04/20/2022 1. Challenging study, PVCs noted.   2. Left ventricular ejection fraction, by estimation, is 45 to 50%. The  left ventricle has mildly decreased function. The left ventricle has no  regional wall motion abnormalities. There is mild left ventricular  hypertrophy. Left ventricular diastolic  parameters are consistent with Grade I diastolic dysfunction (impaired  relaxation).   3. Right  ventricular systolic function is mildly reduced. The right  ventricular size is normal. Tricuspid regurgitation signal is inadequate  for assessing PA pressure.   4. The mitral valve is normal in structure. Mild to moderate mitral valve  regurgitation. No evidence of mitral stenosis.   5. The aortic valve is normal in structure. Aortic valve regurgitation is  mild. No aortic stenosis is present.   6. There is borderline dilatation of the aortic root, measuring 37 mm.  There is mild dilatation of the ascending aorta, measuring 42 mm.   7. The inferior vena cava is normal in size with greater than 50%  respiratory variability, suggesting right atrial pressure of 3 mmHg.   Patient Profile  76 y.o. male with a history of chronic systolic and diastolic heart failure, PAF, COPD, myasthenia gravis, admitted with PEA arrest in the setting of acute on chronic systolic/diastolic heart failure and hypoxic respiratory failure in the setting of not taking his diuretics.  Hospitalization was complicated by acute on chronic renal failure, pneumonia, and possible myasthenia gravis flare.  Assessment & Plan    1.  Acute on chronic combined syst/diast CHF:  EF 35-40% by echo this admission.  IV lasix resumed 6/5 due to ongoing dyspnea and volume overload.  +595.2 on 6/5. Minus 8.1L since admission.  BMET pending.  Only trace lower ext edema this AM. No distress.  Exam challenging due to body habitus.  Plan for R & L heart cath this afternoon.  Cont ARB, digoxin, and ? blocker (started 6/5 after discussion w/ neuro).  Plan to transition ? blocker to toprol xl prior to d/c, if tolerated.  2.  PEA Arrest/NSTEMI:  Presented to the ED 5/21 w/ resp failure and suffered PEA arrest in the setting of hypoxia s/p 8 mins of CPR w/ ROSC.  Has residual chest wall pain, otw stable. Cath today.  3.  Myasthenia Gravis:  neuro following.  S/p course of IVIG - pt unsure that he's seen a difference in strength/diplopia.  4.   Essential HTN:  stable.  5.  PAF:  No recurrence since 5/31.  Cont ? blocker and digoxin.  Industry on hold.  Heparin on hold since 6/4 2/2 hematuria - no recurrence.  Plan to resume [place] post-cath.  6.  L Knee pain:  pt "felt pop" on 6/4 and has been having L knee swelling, pain, and tenderness since.  Limited mobility.  X-ray 6/4 w/ suprapatellar joint effusion and degenerative changes.  Per IM.  Signed, Murray Hodgkins, NP  05/16/2022, 7:54 AM    For questions or updates, please contact   Please consult [email or web address] for contact info under Cardiology/STEMI.

## 2022-05-16 NOTE — Progress Notes (Signed)
Progress Note   Patient: Joseph Macdonald LPF:[id number] DOB: [birth year] DOA: 04/30/2022     16 DOS: the patient was seen and examined on 05/16/2022   Brief hospital course: 76 year old male with past medical history of morbid obesity, myasthenia gravis, COPD, systolic/diastolic CHF, diabetes mellitus and atrial fibrillation on Eliquis presented to the emergency room on 5/21 with hypoxia requiring nonrebreather and then suffered a cardiac arrest upon arrival to the emergency room.  Patient able to be resuscitated.  CT scan revealed aspiration pneumonia.  Patient admitted to the critical care service and placed on ventilator support.  Initially extubated 5/24 but reintubated same day.  Underwent bronchoscopy removing bloody mucous plugs.  Able to be extubated on 5/27.  Patient transferred out of ICU on 5/29.  Cardiology, neurology and palliative care have been following as well.  Patient also looks to be having an acute flareup of his myasthenia gravis and was started on IVIG as of 5/31.  With IVIG, patient has also been started on aggressive diuresis and is currently diuresed over 19 L and is more than 8.5 L deficient.  Plan is for cardiac catheterization today, 6/6.   Assessment and Plan: * Cardiac arrest due to respiratory disorder [facility]) Patient with chest wall pain.  No evidence of fractures on x-ray.  Pain control, cardiology planning cardiac cath tomorrow  Acute respiratory failure with hypoxia (HCC) In the setting of heart failure exacerbation as well as pneumonia.  Status post ventilator support.  Continue steroids, antibiotics and nebulizers.  Pulmonary following.  Improved.  Patient is down to 2-3 L nasal cannula.  Hopefully can tolerate cardiac catheterization and laying flat on his back  Acute on chronic HFrEF (heart failure with reduced ejection fraction) [facility]) Cardiology following.  Echocardiogram done 5/21 notes ejection fraction of 35 to 40% and grade 1 diastolic dysfunction as well as  moderately reduced right ventricular systolic function.  Initially held further diuresis in the setting of hyponatremia.  No beta-blockers given myasthenia gravis.  Restarted on IV Lasix twice daily which will be stopped following completion of IVIG.  Patient has responded to diuresis, diuresing over 15 L and is almost -5 L deficient.  He was unable to do cardiac catheterization on 6/5 because patient could not tolerate lying flat, but hopefully will be able to tolerate on 6/6    Generalized weakness Neurology following.  Patient with history of myasthenia gravis and weakness thought to be flare, so has been put on Mestinon started course of IVIG.  Despite completion of IVIG, still some residual weakness so not felt to be a myasthenia gravis flare.  Mestinon also discontinued.  Possibly global deconditioning.  Hypertensive emergency Blood pressures after extubation were in the 532D systolic.  Weaned off of nitroglycerin drip.  Avoiding beta-blockers.  Continue losartan, Lasix and as needed hydralazine  Paroxysmal atrial fibrillation (HCC) Currently in sinus rhythm.  Continue digoxin.  Holding Eliquis.  Was on heparin drip but on hold due to hematuria.  Resume heparin if hematuria improves or slows down later tonight or first in the morning  Pressure injury of skin Pressure Injury Buttocks Right Stage 2 -  Partial thickness loss of dermis presenting as a shallow open injury with a red, pink wound bed without slough. (Active)     Location: Buttocks  Location Orientation: Right  Staging: Stage 2 -  Partial thickness loss of dermis presenting as a shallow open injury with a red, pink wound bed without slough.  Wound Description (Comments):   Present  on Admission: No   Stage II right buttock ulcer, not present on admission.  Wound care per wound care nurse   Morbid obesity ([place]) Meets criteria BMI greater than 30 and comorbidities of heart failure and hypertension.  Complicates overall  prognosis        Subjective: Patient feels tired although breathing he states is a little better Physical Exam: Vitals:   05/16/22 1452 05/16/22 1500 05/16/22 1515 05/16/22 1530  BP: 130/69 (!) 133/93 134/73 (!) 135/59  Pulse: 83 87 (!) 49 97  Resp: (!) 22 (!) 24 19 (!) 24  Temp:      TempSrc:      SpO2: 90% 90% 92% 91%  Weight:      Height:       General: Fatigued, no acute distress HEENT: Normocephalic, atraumatic, mucous membranes are slightly dry.  Narrow airway Cardiovascular: Regular rhythm X1-G6, 2/6 systolic ejection murmur, Respiratory: Decreased breath sounds throughout Abdomen: Soft, obese, nontender, positive bowel sounds Musculoskeletal: Chest wall tenderness much improved from previous.  No clubbing or cyanosis, trace pitting edema Skin: No skin breaks, tears or lesions Psychiatry: Appropriate, no evidence of psychoses  Data Reviewed: Ultralight stable  Family Communication: Left message for family  Disposition: Status is: Inpatient Remains inpatient appropriate because: Cardiac catheterization, rescheduled for tomorrow   Planned Discharge Destination: Home with Home Health    DVT prophylaxis- SCDs.  Heparin on hold as he had hematuria  Author: Annita Brod, MD 05/16/2022 3:56 PM  For on call review [email or web address].

## 2022-05-17 ENCOUNTER — Encounter: Payer: Self-pay | Admitting: Internal Medicine

## 2022-05-17 DIAGNOSIS — I480 Paroxysmal atrial fibrillation: Secondary | ICD-10-CM | POA: Diagnosis not present

## 2022-05-17 DIAGNOSIS — I5023 Acute on chronic systolic (congestive) heart failure: Secondary | ICD-10-CM | POA: Diagnosis not present

## 2022-05-17 DIAGNOSIS — R531 Weakness: Secondary | ICD-10-CM | POA: Diagnosis not present

## 2022-05-17 DIAGNOSIS — J9601 Acute respiratory failure with hypoxia: Secondary | ICD-10-CM | POA: Diagnosis not present

## 2022-05-17 DIAGNOSIS — I468 Cardiac arrest due to other underlying condition: Secondary | ICD-10-CM | POA: Diagnosis not present

## 2022-05-17 DIAGNOSIS — J989 Respiratory disorder, unspecified: Secondary | ICD-10-CM | POA: Diagnosis not present

## 2022-05-17 LAB — BASIC METABOLIC PANEL
Anion gap: 6 (ref 5–15)
BUN: 31 mg/dL — ABNORMAL HIGH (ref 8–23)
CO2: 39 mmol/L — ABNORMAL HIGH (ref 22–32)
Calcium: 8.1 mg/dL — ABNORMAL LOW (ref 8.9–10.3)
Chloride: 95 mmol/L — ABNORMAL LOW (ref 98–111)
Creatinine, Ser: 0.88 mg/dL (ref 0.61–1.24)
GFR, Estimated: >60 mL/min (ref >60)
Glucose, Bld: 126 mg/dL — ABNORMAL HIGH (ref 70–99)
Potassium: 4 mmol/L (ref 3.5–5.1)
Sodium: 140 mmol/L (ref 135–145)

## 2022-05-17 LAB — CBC
HCT: 42.8 % (ref 39.0–52.0)
Hemoglobin: 12.5 g/dL — ABNORMAL LOW (ref 13.0–17.0)
MCH: 29.1 pg (ref 26.0–34.0)
MCHC: 29.2 g/dL — ABNORMAL LOW (ref 30.0–36.0)
MCV: 99.5 fL (ref 80.0–100.0)
Platelets: 212 10*3/uL (ref 150–400)
RBC: 4.3 MIL/uL (ref 4.22–5.81)
RDW: 13.5 % (ref 11.5–15.5)
WBC: 7.6 10*3/uL (ref 4.0–10.5)
nRBC: 0 % (ref 0.0–0.2)

## 2022-05-17 LAB — GLUCOSE, CAPILLARY
Glucose-Capillary: 114 mg/dL — ABNORMAL HIGH (ref 70–99)
Glucose-Capillary: 181 mg/dL — ABNORMAL HIGH (ref 70–99)
Glucose-Capillary: 130 mg/dL — ABNORMAL HIGH (ref 70–99)
Glucose-Capillary: 177 mg/dL — ABNORMAL HIGH (ref 70–99)

## 2022-05-17 MED ORDER — METOPROLOL SUCCINATE ER 25 MG PO TB24
25.0000 mg | ORAL_TABLET | Freq: Every day | ORAL | Status: DC
  Administered 2022-05-17: 25 mg via ORAL
  Filled 2022-05-17: qty 1

## 2022-05-17 MED ORDER — APIXABAN 5 MG PO TABS
5.0000 mg | ORAL_TABLET | Freq: Two times a day (BID) | ORAL | Status: DC
  Administered 2022-05-17 – 2022-05-18 (×3): 5 mg via ORAL
  Filled 2022-05-17 (×3): qty 1

## 2022-05-17 MED ORDER — METOPROLOL SUCCINATE ER 50 MG PO TB24
50.0000 mg | ORAL_TABLET | Freq: Every day | ORAL | Status: DC
Start: 2022-05-18 — End: 2022-05-18
  Administered 2022-05-18: 50 mg via ORAL
  Filled 2022-05-17: qty 1

## 2022-05-17 MED ORDER — ZINC SULFATE 220 (50 ZN) MG PO CAPS
220.0000 mg | ORAL_CAPSULE | Freq: Every day | ORAL | Status: DC
  Administered 2022-05-17 – 2022-05-18 (×2): 220 mg via ORAL
  Filled 2022-05-17 (×2): qty 1

## 2022-05-17 MED ORDER — ASCORBIC ACID 500 MG PO TABS
500.0000 mg | ORAL_TABLET | Freq: Two times a day (BID) | ORAL | Status: DC
  Administered 2022-05-17 – 2022-05-18 (×3): 500 mg via ORAL
  Filled 2022-05-17 (×3): qty 1

## 2022-05-17 NOTE — Progress Notes (Signed)
Physical Therapy Treatment Patient Details Name: Joseph Macdonald MRN: [id number] DOB: 08-23-1946 Today's Date: 05/17/2022   History of Present Illness Joseph Macdonald is a 76 yo M presenting to [facility] ED via EMS from home on 04/30/22 in respiratory distress. Per documentation patient was found by EMS hypoxic with SpO2 71%-88% placed on NRB with albuterol administered. Patient received CPR for 8 minutes in ED with no defibrillations, and was emergently intubated requiring mechanical ventilatory support now extubated. MD assessment includes: Severe ACUTE Hypoxic and Hypercapnic Respiratory Failure, HCAP, acute systolic cardiac failure, Myasthenia Gravis query crisis, AKI, and elevated troponin in setting of demand ischemia.    PT Comments    Patient received in bed, agrees to PT session. Reports pain in left leg. Yelling out with movement. Unable to move it independently in bed. Required mod assist for bed mobility. Mod A for sit to stand from elevated bed. Unable to take any steps this date requiring max/total A +2 for pivot to recliner. Patient continues to be very weak and will benefit from continued skilled PT.      Recommendations for follow up therapy are one component of a multi-disciplinary discharge planning process, led by the attending physician.  Recommendations may be updated based on patient status, additional functional criteria and insurance authorization.  Follow Up Recommendations  Skilled nursing-short term rehab (<3 hours/day)     Assistance Recommended at Discharge Frequent or constant Supervision/Assistance  Patient can return home with the following Two people to help with walking and/or transfers;Two people to help with bathing/dressing/bathroom;Assistance with cooking/housework;Help with stairs or ramp for entrance;Assist for transportation   Equipment Recommendations  Rolling walker (2 wheels);Wheelchair (measurements PT);Wheelchair cushion (measurements PT);Other (comment)  (TBD at next venue)    Recommendations for Other Services       Precautions / Restrictions Precautions Precautions: Fall Restrictions Weight Bearing Restrictions: No     Mobility  Bed Mobility Overal bed mobility: Needs Assistance Bed Mobility: Supine to Sit     Supine to sit: Mod assist          Transfers Overall transfer level: Needs assistance Equipment used: Rolling walker (2 wheels) Transfers: Sit to/from Stand, Bed to chair/wheelchair/BSC Sit to Stand: Mod assist, From elevated surface     Squat pivot transfers: Total assist, +2 physical assistance, From elevated surface     General transfer comment: patient unable to take any steps toward recliner due to left LE pain. Required total assist for pivot to recliner    Ambulation/Gait               General Gait Details: unable   Stairs             Wheelchair Mobility    Modified Rankin (Stroke Patients Only)       Balance Overall balance assessment: Needs assistance Sitting-balance support: Feet supported Sitting balance-Leahy Scale: Good Sitting balance - Comments: Able to sit up on edge of bed without external support. Fatigued   Standing balance support: Bilateral upper extremity supported, During functional activity, Reliant on assistive device for balance Standing balance-Leahy Scale: Poor Standing balance comment: Unable to get fully standing with RW this session                            Cognition Arousal/Alertness: Awake/alert Behavior During Therapy: WFL for tasks assessed/performed Overall Cognitive Status: Within Functional Limits for tasks assessed  Exercises      General Comments        Pertinent Vitals/Pain Pain Assessment Pain Assessment: Faces Faces Pain Scale: Hurts even more Pain Location: L LE Pain Descriptors / Indicators: Discomfort, Moaning Pain Intervention(s): Monitored during  session, Repositioned    Home Living                          Prior Function            PT Goals (current goals can now be found in the care plan section) Acute Rehab PT Goals Patient Stated Goal: To walk better with better endurance PT Goal Formulation: With patient Time For Goal Achievement: 05/21/22 Potential to Achieve Goals: Fair Progress towards PT goals: Not progressing toward goals - comment (pain and fatigue limited)    Frequency    Min 2X/week      PT Plan Current plan remains appropriate    Co-evaluation              AM-PAC PT "6 Clicks" Mobility   Outcome Measure  Help needed turning from your back to your side while in a flat bed without using bedrails?: A Lot Help needed moving from lying on your back to sitting on the side of a flat bed without using bedrails?: A Lot Help needed moving to and from a bed to a chair (including a wheelchair)?: Total Help needed standing up from a chair using your arms (e.g., wheelchair or bedside chair)?: A Lot Help needed to walk in hospital room?: Total Help needed climbing 3-5 steps with a railing? : Total 6 Click Score: 9    End of Session Equipment Utilized During Treatment: Gait belt;Oxygen Activity Tolerance: Patient limited by fatigue;Patient limited by pain Patient left: in chair;with call bell/phone within reach Nurse Communication: Mobility status PT Visit Diagnosis: Muscle weakness (generalized) (M62.81);Difficulty in walking, not elsewhere classified (R26.2);Unsteadiness on feet (R26.81);Pain Pain - Right/Left: Left Pain - part of body: Leg     Time: 7026-3785 PT Time Calculation (min) (ACUTE ONLY): 25 min  Charges:  $Therapeutic Activity: 23-37 mins                     Ariann Khaimov, PT, GCS 05/17/22,11:49 AM

## 2022-05-17 NOTE — Assessment & Plan Note (Signed)
Now resolved. Continue losartan, lasix and metoprolol

## 2022-05-17 NOTE — Progress Notes (Signed)
Nutrition Follow-up  DOCUMENTATION CODES:   Obesity unspecified  INTERVENTION:   -Continue Ensure Enlive po BID, each supplement provides 350 kcal and 20 grams of protein -Continue MVI with minerals daily -500 mg vitamin C BID -220 mg zinc sulfate daily x 14 days   NUTRITION DIAGNOSIS:   Inadequate oral intake related to inability to eat as evidenced by NPO status.  Progressing; advanced to PO diet on 05/07/22  GOAL:   Patient will meet greater than or equal to 90% of their needs  Progressing   MONITOR:   PO intake, Supplement acceptance  REASON FOR ASSESSMENT:   Ventilator    ASSESSMENT:   Pt presented with cardiac arrest. PMH HTN, HLD, MG, COPD, and arthritis.  5/21- intubated 5/22- TF initiated 5/23- TF stopped secondary to tan secretions coming out of tube 5/24- extubated, re-intubated, TF resumed, s/p bronch 5/27- extubated 5/28- s/p BSE- advanced to dysphagia 3 diet with thin liquids 6/6- s/p heart cath  Reviewed I/O's: -1.5 L x 24 hours and -19 L since 05/03/22  UOP: 1.5 L x 24 hours  Pt remains with good appetite. Noted meal completions 50-100%. Pt is drinking Ensure supplements.   Per TOC notes, awaiting SNF placement once medically stable.    Labs reviewed: CBGS: 114-181 (inpatient orders for glycemic control are 0-20 units insulin aspart TID with meals).    Diet Order:   Diet Order             Diet Carb Modified Fluid consistency: Thin; Room service appropriate? Yes; Fluid restriction: 2000 mL Fluid  Diet effective now                   EDUCATION NEEDS:   No education needs have been identified at this time  Skin:  Skin Assessment: Skin Integrity Issues: Skin Integrity Issues:: Stage II Stage II: rt buttocks Other: skin tear to rt lower arm, rt posterior elbow  Last BM:  05/12/22 (type 7)  Height:   Ht Readings from Last 1 Encounters:  05/16/22 '5\' 7"'$  (1.702 m)    Weight:   Wt Readings from Last 1 Encounters:  05/17/22 105  kg    Ideal Body Weight:  67.3 kg  BMI:  Body mass index is 36.26 kg/m.  Estimated Nutritional Needs:   Kcal:  2000-2200  Protein:  100-115 grams  Fluid:  > 2 L    Loistine Chance, RD, LDN, [place] Registered Dietitian II Certified Diabetes Care and Education Specialist Please refer to [facility] for RD and/or RD on-call/weekend/after hours pager

## 2022-05-17 NOTE — Assessment & Plan Note (Signed)
Neurology following.  Patient with history of myasthenia gravis and weakness thought to be flare, so has been put on Mestinon started course of IVIG.  Despite completion of IVIG, still some residual weakness so not felt to be a myasthenia gravis flare.  Mestinon also discontinued.  Possibly global deconditioning. PT, OT -> SNF

## 2022-05-17 NOTE — TOC Progression Note (Addendum)
Transition of Care [facility]) - Progression Note    Patient Details  Name: Joseph Macdonald MRN: [id number] Date of Birth: 1946/01/18  Transition of Care [facility]) CM/SW Contact  Laurena Slimmer, RN Phone Number: 05/17/2022, 10:00 AM  Clinical Narrative:    Spoke with Admissions coordinator, Seth Bake at [facility]. Patient bed offer rescinded due to limited availability. Seth Bake stated it is likley other bed offers may be offered but not before the week's end. MD notified.   Expected Discharge Plan:  (TBD) Barriers to Discharge: Continued Medical Work up  Expected Discharge Plan and Services Expected Discharge Plan:  (TBD)       Living arrangements for the past 2 months: Single Family Home                                       Social Determinants of Health (SDOH) Interventions    Readmission Risk Interventions     View : No data to display.

## 2022-05-17 NOTE — Assessment & Plan Note (Signed)
In the setting of heart failure exacerbation as well as pneumonia.  Status post ventilator support.  Continue steroids, antibiotics and nebulizers.  Pulmonary following.  Improved.  Patient is on about 4 L nasal cannula.

## 2022-05-17 NOTE — Assessment & Plan Note (Signed)
Currently in sinus rhythm.  Continue digoxin & resume Eliquis.

## 2022-05-17 NOTE — TOC Progression Note (Signed)
Transition of Care [facility]) - Progression Note    Patient Details  Name: KEINO PLACENCIA MRN: [id number] Date of Birth: [birth year]  Transition [facility]) CM/SW Contact  Laurena Slimmer, RN Phone Number: 05/17/2022, 2:43 PM  Clinical Narrative:    Spoke with patient and wife at bedside regarding discharge plan. Patient and wife informed bed offer from [facility] was rescinded. Patient requesting bed at [facility]. Contact admissions coordinator Magda Paganini. Patient offered a bed 6/8 at facility. Authorization number provided. HTA contacted to advise auth needed to be changed from [facility] to [facility].   Expected Discharge Plan:  (TBD) Barriers to Discharge: Continued Medical Work up  Expected Discharge Plan and Services Expected Discharge Plan:  (TBD)       Living arrangements for the past 2 months: Single Family Home                                       Social Determinants of Health (SDOH) Interventions    Readmission Risk Interventions     View : No data to display.

## 2022-05-17 NOTE — Assessment & Plan Note (Signed)
Cardiology following.  Echocardiogram done 5/21 notes ejection fraction of 35 to 40% and grade 1 diastolic dysfunction as well as moderately reduced right ventricular systolic function.  Initially held further diuresis in the setting of hyponatremia.  No beta-blockers given myasthenia gravis.  Restarted on IV Lasix twice daily and now switched to 40 mg po bid. diuresing over 18 L  Net IO Since Admission: -18,458.31 mL [05/17/22 2139]

## 2022-05-17 NOTE — Progress Notes (Signed)
Cardiology Progress Note   Patient Name: Joseph Macdonald Date of Encounter: 05/17/2022  Primary Cardiologist: [name], MD  Subjective   Patient seen on AM rounds. Denies any chest pain or worsening shortness of breath. Endorses generalized weakness. R/LHC completed 05/16/2022 without difficulty. Right arm cath site without issues over night. Patient is awake and sitting up in bed awaiting breakfast. -1.5L over night. Continues to complain of left knee discomfort.   Inpatient Medications    Scheduled Meds:  chlorhexidine  15 mL Mouth Rinse BID   digoxin  0.125 mg Oral Daily   feeding supplement  237 mL Oral BID BM   furosemide  40 mg Oral BID   insulin aspart  0-20 Units Subcutaneous TID WC & HS   ipratropium-albuterol  3 mL Nebulization BID   losartan  25 mg Oral Daily   mouth rinse  15 mL Mouth Rinse q12n4p   metoprolol tartrate  25 mg Oral BID   multivitamin with minerals  1 tablet Oral Daily   nystatin ointment   Topical BID   OLANZapine zydis  5 mg Oral BID   pantoprazole  40 mg Oral Daily   pyridostigmine  30 mg Oral TID   sodium chloride flush  3 mL Intravenous Q12H   sodium chloride flush  3 mL Intravenous Q12H   sodium chloride flush  3 mL Intravenous Q12H   Continuous Infusions:  sodium chloride Stopped (05/09/22 1434)   sodium chloride Stopped (05/17/22 0400)   PRN Meds: sodium chloride, sodium chloride, acetaminophen, atropine, diazepam, hydrALAZINE, HYDROcodone-acetaminophen, polyethylene glycol, sodium chloride flush, sodium chloride flush   Vital Signs    Vitals:   05/16/22 1838 05/16/22 2040 05/16/22 2323 05/17/22 0500  BP: 118/64 123/64 121/72   Pulse: 94 (!) 102 94   Resp: '17 18 16   '$ Temp: 98.3 F (36.8 C) 98.4 F (36.9 C) 98.1 F (36.7 C)   TempSrc: Oral Oral Oral   SpO2: 92% 95% 96%   Weight:    105 kg  Height:        Intake/Output Summary (Last 24 hours) at 05/17/2022 0757 Last data filed at 05/17/2022 0500 Gross per 24 hour  Intake  0 ml  Output 1500 ml  Net -1500 ml   Filed Weights   05/16/22 0500 05/16/22 1311 05/17/22 0500  Weight: 104.3 kg 104.3 kg 105 kg    Physical Exam   GEN: Well nourished, well developed, in no acute distress.  HEENT: Grossly normal. Glasses on. Neck: Supple, no JVD, carotid bruits, or masses. Cardiac: RRR, no murmurs, rubs, or gallops. No clubbing, cyanosis, edema.  Radials 2+, DP/PT 2+ and equal bilaterally.  Respiratory:  Respirations regular and unlabored, coarse to auscultation bilaterally on 2L O2 via [place]. GI: Soft, nontender, nondistended, obese, BS + x 4. MS: Limited ROM to the left knee, knee is swollen and tender to mild palpation. Right radial cath site, without bruising or hematoma, right basilic vein site with small opsite and gauze dressing that is clean, dry, and intact, with bleeding or hematoma noted Skin: warm and dry, no rash. Neuro:  Strength and sensation are intact. Psych: AAOx3.  Normal affect.  Labs    Chemistry Recent Labs  Lab 05/15/22 0529 05/16/22 0826 05/17/22 0532  NA 140 140 140  K 4.2 4.3 4.0  CL 99 96* 95*  CO2 35* 39* 39*  GLUCOSE 113* 108* 126*  BUN 25* 30* 31*  CREATININE 0.88 0.85 0.88  CALCIUM 7.6* 8.4* 8.1*  GFRNONAA >60 >60 >60  ANIONGAP '6 5 6     '$ Hematology Recent Labs  Lab 05/14/22 0559 05/15/22 0529 05/17/22 0532  WBC 10.8* 9.3 7.6  RBC 4.68 4.58 4.30  HGB 13.6 13.6 12.5*  HCT 45.3 45.0 42.8  MCV 96.8 98.3 99.5  MCH 29.1 29.7 29.1  MCHC 30.0 30.2 29.2*  RDW 13.7 13.7 13.5  PLT 178 157 212    Cardiac Enzymes  Recent Labs  Lab 04/30/22 0332 04/30/22 0546 04/30/22 1734 04/30/22 1955  TROPONINIHS 373* 1,137* 2,844* 2,210*      BNP    Component Value Date/Time   BNP 971.6 (H) 04/30/2022 0332    ProBNP No results found for: PROBNP   DDimer No results for input(s): DDIMER in the last 168 hours.   Lipids  Lab Results  Component Value Date   CHOL 141 04/03/2022   HDL 62 04/03/2022   LDLCALC 62  04/03/2022   TRIG 92 04/03/2022   CHOLHDL 2.3 04/03/2022    HbA1c  Lab Results  Component Value Date   HGBA1C 6.9 (H) 04/30/2022    Radiology     Telemetry     SR with unifocal PVC's occassionally noted to be in trigeminy- Personally Reviewed  ECG    No new tracings - Personally Reviewed  Cardiac Studies  Right/Left Heart Cath and Coronary Angiography completed on 05/16/2022 Conclusions: Moderate single-vessel coronary artery disease with 60% mid LAD stenosis that is borderline significant by iFR (iFR = 0.90). No angiographically significant coronary artery disease observed in the LCx or RCA. Upper normal to mildly elevated left heart filling pressures (LVEDP and PCWP 18 mmHg). Moderate pulmonary hypertension (mean PAP 34 mmHg, PVR 2.9 WU).  Moderately-severely elevated right heart filling pressure (mean RAP 15 mmHg, RVEDP 14 mmHg). Normal Fick cardiac output/index (CO 5.5 L/min, CI 2.6 L/min/m).    Echocardiogram completed on 04/30/2022 1. Left ventricular ejection fraction, by estimation, is 35 to 40%. The  left ventricle has moderately decreased function. The left ventricle  demonstrates global hypokinesis. There is mild left ventricular  hypertrophy. Left ventricular diastolic  parameters are consistent with Grade I diastolic dysfunction (impaired  relaxation).   2. Right ventricular systolic function is moderately reduced. The right  ventricular size is mildly enlarged.   3. The mitral valve is normal in structure. No evidence of mitral valve  regurgitation.   4. The aortic valve is grossly normal. Aortic valve regurgitation is not  visualized.   Echocardiogram completed on 04/20/2022 1. Challenging study, PVCs noted.   2. Left ventricular ejection fraction, by estimation, is 45 to 50%. The  left ventricle has mildly decreased function. The left ventricle has no  regional wall motion abnormalities. There is mild left ventricular  hypertrophy. Left ventricular  diastolic  parameters are consistent with Grade I diastolic dysfunction (impaired  relaxation).   3. Right ventricular systolic function is mildly reduced. The right  ventricular size is normal. Tricuspid regurgitation signal is inadequate  for assessing PA pressure.   4. The mitral valve is normal in structure. Mild to moderate mitral valve  regurgitation. No evidence of mitral stenosis.   5. The aortic valve is normal in structure. Aortic valve regurgitation is  mild. No aortic stenosis is present.   6. There is borderline dilatation of the aortic root, measuring 37 mm.  There is mild dilatation of the ascending aorta, measuring 42 mm.   7. The inferior vena cava is normal in size with greater than 50%  respiratory  variability, suggesting right atrial pressure of 3 mmHg.  Patient Profile     76 y.o. male with a history of chronic systolic and diastolic heart failure, PAF, COPD, myasthenia gravis, admitted with PEA arrest in the setting of acute on chronic systolic/diastolic heart failure and hypoxic respiratory failure in the setting of medication noncompliance as in not taking his home diuretics. Hospitalization was further complicated by acute on chronic renal failure, pneumonia, and a possible myasthenia gravis flare.   Assessment & Plan    1.Acute on chronic combines systolic/diastolic congestive heart failure - EF 35-40% - IV lasix resumed on 6/5 due to ongoing dyspnea and volume over load - -1.5L in the last 24 hours - R/LHC revealed moderate pulmonary hypertension and moderately-severely elevated right heart filling pressures -Recommend to continue with gentle diuresis - Likely primary pulmonary process driving respiratory status  changes - Continue ARB, dig, diuretic, and beta blocker therapy - Daily weight, strict I & O, low sodium diet  2. PEA arrest/NSTEMI - PEA arrest in the setting of hypoxia s/p 8 minutes of CPR w/ ROSC - No shockable rhythms noted - R/LHC completed  with moderate single-vessel coronary artery disease with 60% mid LAD stenosis  3.Essential Hypertension - Continue current medications - pressures have been stable   4.PAF - Metoprolol XL increased to 50 mg daily for high PV/PAC  burden on telemetry - Continue eliquis 5 mg bid - currently SR on telemetry  5. Myasthenia gravis - Received 5 days of IVIG - Neuro continues to follow - Patient continues with weakness and will need continued physical therapy  Signed, Airabella Barley, NP  05/17/2022, 7:57 AM    For questions or updates, please contact   Please consult [email or web address] for contact info under Cardiology/STEMI.

## 2022-05-17 NOTE — Progress Notes (Signed)
Physical Therapy Treatment Patient Details Name: MARIUSZ JUBB MRN: [id number] DOB: August 20, 1946 Today's Date: 05/17/2022   History of Present Illness Jadarion Halbig is a 76 yo M presenting [facility] ED via EMS from home on 04/30/22 in respiratory distress. Per documentation patient was found by EMS hypoxic with SpO2 71%-88% placed on NRB with albuterol administered. Patient received CPR for 8 minutes in ED with no defibrillations, and was emergently intubated requiring mechanical ventilatory support now extubated. MD assessment includes: Severe ACUTE Hypoxic and Hypercapnic Respiratory Failure, HCAP, acute systolic cardiac failure, Myasthenia Gravis query crisis, AKI, and elevated troponin in setting of demand ischemia.    PT Comments    RN messaged this Probation officer that patient wanted to get back into bed and she needed assistance. Patient received in recliner, wife at bedside. He requires +2 total assist for pivot back to bed. Mod A +2 for sit to supine. Total assist to slide up in bed. Patient continues to benefit from skilled PT to improve strength and functional independence.      Recommendations for follow up therapy are one component of a multi-disciplinary discharge planning process, led by the attending physician.  Recommendations may be updated based on patient status, additional functional criteria and insurance authorization.  Follow Up Recommendations  Skilled nursing-short term rehab (<3 hours/day)     Assistance Recommended at Discharge Frequent or constant Supervision/Assistance  Patient can return home with the following Two people to help with walking and/or transfers;Two people to help with bathing/dressing/bathroom;Assistance with cooking/housework;Help with stairs or ramp for entrance;Assist for transportation   Equipment Recommendations  Rolling walker (2 wheels);Wheelchair (measurements PT);Wheelchair cushion (measurements PT);Other (comment) (TBD next venue)    Recommendations  for Other Services       Precautions / Restrictions Precautions Precautions: Fall Restrictions Weight Bearing Restrictions: No     Mobility  Bed Mobility Overal bed mobility: Needs Assistance Bed Mobility: Sit to Supine     Supine to sit: Mod assist Sit to supine: Mod assist, +2 for physical assistance, HOB elevated        Transfers Overall transfer level: Needs assistance Equipment used: None Transfers: Bed to chair/wheelchair/BSC Sit to Stand: Mod assist, From elevated surface     Squat pivot transfers: Max assist, +2 physical assistance     General transfer comment: patient unable to take any steps toward recliner due to left LE pain. Required total assist for pivot to recliner    Ambulation/Gait               General Gait Details: unable   Stairs             Wheelchair Mobility    Modified Rankin (Stroke Patients Only)       Balance Overall balance assessment: Needs assistance Sitting-balance support: Feet supported Sitting balance-Leahy Scale: Good Sitting balance - Comments: Able to sit up on edge of bed without external support. Fatigued   Standing balance support: Bilateral upper extremity supported, During functional activity, Reliant on assistive device for balance Standing balance-Leahy Scale: Zero Standing balance comment: Unable to get fully standing with RW this session                            Cognition Arousal/Alertness: Awake/alert Behavior During Therapy: WFL for tasks assessed/performed Overall Cognitive Status: Within Functional Limits for tasks assessed  General Comments: Pleasant gentleman, eager to get better        Exercises      General Comments        Pertinent Vitals/Pain Pain Assessment Pain Assessment: Faces Faces Pain Scale: Hurts even more Pain Location: L LE Pain Descriptors / Indicators: Discomfort, Moaning Pain Intervention(s):  Monitored during session, Repositioned    Home Living                          Prior Function            PT Goals (current goals can now be found in the care plan section) Acute Rehab PT Goals Patient Stated Goal: To walk better with better endurance PT Goal Formulation: With patient Time For Goal Achievement: 05/21/22 Potential to Achieve Goals: Fair Progress towards PT goals: Not progressing toward goals - comment (continues to be very weak requiring +2 assist)    Frequency    Min 2X/week      PT Plan Current plan remains appropriate    Co-evaluation              AM-PAC PT "6 Clicks" Mobility   Outcome Measure  Help needed turning from your back to your side while in a flat bed without using bedrails?: A Lot Help needed moving from lying on your back to sitting on the side of a flat bed without using bedrails?: A Lot Help needed moving to and from a bed to a chair (including a wheelchair)?: Total Help needed standing up from a chair using your arms (e.g., wheelchair or bedside chair)?: A Lot Help needed to walk in hospital room?: Total Help needed climbing 3-5 steps with a railing? : Total 6 Click Score: 9    End of Session Equipment Utilized During Treatment: Gait belt;Oxygen Activity Tolerance: Patient limited by fatigue;Patient limited by pain Patient left: in bed;with call bell/phone within reach;with bed alarm set;with family/visitor present Nurse Communication: Mobility status PT Visit Diagnosis: Muscle weakness (generalized) (M62.81);Difficulty in walking, not elsewhere classified (R26.2);Unsteadiness on feet (R26.81);Pain Pain - Right/Left: Left Pain - part of body: Leg     Time: 2725-3664 PT Time Calculation (min) (ACUTE ONLY): 13 min  Charges:  $Therapeutic Activity: 8-22 mins                     Adrena Nakamura, PT, GCS 05/17/22,1:34 PM

## 2022-05-17 NOTE — Assessment & Plan Note (Signed)
Patient with chest wall pain.  No evidence of fractures on x-ray.  Pain control, LHC with non-obstructive CAD

## 2022-05-17 NOTE — Assessment & Plan Note (Signed)
Pressure Injury Buttocks Right Stage 2 -  Partial thickness loss of dermis presenting as a shallow open injury with a red, pink wound bed without slough. (Active)     Location: Buttocks  Location Orientation: Right  Staging: Stage 2 -  Partial thickness loss of dermis presenting as a shallow open injury with a red, pink wound bed without slough.  Wound Description (Comments):   Present on Admission: No   Stage II right buttock ulcer, not present on admission.  Wound care per wound care nurse.

## 2022-05-17 NOTE — Assessment & Plan Note (Signed)
Meets criteria BMI greater than 30 and comorbidities of heart failure and hypertension.  Complicates overall prognosis.

## 2022-05-17 NOTE — Progress Notes (Signed)
Progress Note   Patient: Joseph Macdonald PPJ:[id number] DOB: September 16, 1946 DOA: 04/30/2022     17 DOS: the patient was seen and examined on 05/17/2022   Brief hospital course: 76 year old male with past medical history of morbid obesity, myasthenia gravis, COPD, systolic/diastolic CHF, diabetes mellitus and atrial fibrillation on Eliquis presented to the emergency room on 5/21 with hypoxia requiring nonrebreather and then suffered a cardiac arrest upon arrival to the emergency room.  Patient able to be resuscitated.  CT scan revealed aspiration pneumonia.  Patient admitted to the critical care service and placed on ventilator support.  Initially extubated 5/24 but reintubated same day.  Underwent bronchoscopy removing bloody mucous plugs.  Able to be extubated on 5/27.  Patient transferred out of ICU on 5/29.  Cardiology, neurology and palliative care have been following as well.  Patient also looks to be having an acute flareup of his myasthenia gravis and was started on IVIG as of 5/31.  With IVIG, patient has also been started on aggressive diuresis and is currently diuresed over 19 L and is more than 8.5 L deficient.  Plan is for cardiac catheterization today, 6/6.  6/7 - lost [facility] bed per TOC, plan for [facility] tomorrow    Assessment and Plan: * Cardiac arrest due to respiratory disorder [facility]) Patient with chest wall pain.  No evidence of fractures on x-ray.  Pain control, LHC with non-obstructive CAD  Acute respiratory failure with hypoxia (HCC) In the setting of heart failure exacerbation as well as pneumonia.  Status post ventilator support.  Continue steroids, antibiotics and nebulizers.  Pulmonary following.  Improved.  Patient is on about 4 L nasal cannula.    Acute on chronic HFrEF (heart failure with reduced ejection fraction) [facility]) Cardiology following.  Echocardiogram done 5/21 notes ejection fraction of 35 to 40% and grade 1 diastolic dysfunction as well as moderately  reduced right ventricular systolic function.  Initially held further diuresis in the setting of hyponatremia.  No beta-blockers given myasthenia gravis.  Restarted on IV Lasix twice daily and now switched to 40 mg po bid. diuresing over 18 L  Net IO Since Admission: -18,458.31 mL [05/17/22 2139]   Generalized weakness Neurology following.  Patient with history of myasthenia gravis and weakness thought to be flare, so has been put on Mestinon started course of IVIG.  Despite completion of IVIG, still some residual weakness so not felt to be a myasthenia gravis flare.  Mestinon also discontinued.  Possibly global deconditioning. PT, OT -> SNF  Hypertensive emergency Now resolved. Continue losartan, lasix and metoprolol  Paroxysmal atrial fibrillation (HCC) Currently in sinus rhythm.  Continue digoxin & resume Eliquis.   Pressure injury of skin Pressure Injury Buttocks Right Stage 2 -  Partial thickness loss of dermis presenting as a shallow open injury with a red, pink wound bed without slough. (Active)     Location: Buttocks  Location Orientation: Right  Staging: Stage 2 -  Partial thickness loss of dermis presenting as a shallow open injury with a red, pink wound bed without slough.  Wound Description (Comments):   Present on Admission: No   Stage II right buttock ulcer, not present on admission.  Wound care per wound care nurse.   Morbid obesity ([place]) Meets criteria BMI greater than 30 and comorbidities of heart failure and hypertension.  Complicates overall prognosis.        Subjective: no new c/o, hoping to find different SNF bed as now he doesn't have bed at  [facility]  Physical Exam: Vitals:   05/17/22 1639 05/17/22 1957 05/17/22 2000 05/17/22 2032  BP: (!) 109/52  117/62   Pulse: 86  87   Resp: 19  20   Temp: 97.8 F (36.6 C)  98.2 F (36.8 C)   TempSrc:   Oral   SpO2: 92% 95% 93% 92%  Weight:      Height:       . General: no acute distress . HEENT:  Normocephalic, atraumatic, mucous membranes are slightly dry . Cardiovascular:Regular [id number], 2/6 systolic ejection murmur . Respiratory:Decreased breath sounds throughout . Abdomen:Soft, obese, nontender, positive bowel sounds . Musculoskeletal: No clubbing or cyanosis, trace pitting edema . Skin:No skin breaks, tears or lesions . Psychiatry:Appropriate, no evidence of psychoses .  Data Reviewed:  There are no new results to review at this time.  Family Communication: none  Disposition: Status is: Inpatient Remains inpatient appropriate because: waiting for SNF bed   Planned Discharge Destination: Skilled nursing facility   DVT prophylaxis- Eliquis Time spent: 25 minutes  Author: Max Sane, MD 05/17/2022 9:43 PM  For on call review [email or web address].

## 2022-05-18 ENCOUNTER — Telehealth: Payer: Self-pay | Admitting: *Deleted

## 2022-05-18 DIAGNOSIS — J989 Respiratory disorder, unspecified: Secondary | ICD-10-CM | POA: Diagnosis not present

## 2022-05-18 DIAGNOSIS — Z7901 Long term (current) use of anticoagulants: Secondary | ICD-10-CM | POA: Diagnosis not present

## 2022-05-18 DIAGNOSIS — M6281 Muscle weakness (generalized): Secondary | ICD-10-CM | POA: Diagnosis not present

## 2022-05-18 DIAGNOSIS — J810 Acute pulmonary edema: Secondary | ICD-10-CM | POA: Diagnosis not present

## 2022-05-18 DIAGNOSIS — I440 Atrioventricular block, first degree: Secondary | ICD-10-CM | POA: Diagnosis not present

## 2022-05-18 DIAGNOSIS — Z87891 Personal history of nicotine dependence: Secondary | ICD-10-CM | POA: Diagnosis not present

## 2022-05-18 DIAGNOSIS — L89312 Pressure ulcer of right buttock, stage 2: Secondary | ICD-10-CM | POA: Diagnosis not present

## 2022-05-18 DIAGNOSIS — R1311 Dysphagia, oral phase: Secondary | ICD-10-CM | POA: Diagnosis not present

## 2022-05-18 DIAGNOSIS — I5032 Chronic diastolic (congestive) heart failure: Secondary | ICD-10-CM | POA: Diagnosis not present

## 2022-05-18 DIAGNOSIS — I468 Cardiac arrest due to other underlying condition: Secondary | ICD-10-CM | POA: Diagnosis not present

## 2022-05-18 DIAGNOSIS — M47814 Spondylosis without myelopathy or radiculopathy, thoracic region: Secondary | ICD-10-CM | POA: Diagnosis not present

## 2022-05-18 DIAGNOSIS — J449 Chronic obstructive pulmonary disease, unspecified: Secondary | ICD-10-CM | POA: Diagnosis not present

## 2022-05-18 DIAGNOSIS — Z9981 Dependence on supplemental oxygen: Secondary | ICD-10-CM | POA: Diagnosis not present

## 2022-05-18 DIAGNOSIS — E785 Hyperlipidemia, unspecified: Secondary | ICD-10-CM | POA: Diagnosis not present

## 2022-05-18 DIAGNOSIS — R279 Unspecified lack of coordination: Secondary | ICD-10-CM | POA: Diagnosis not present

## 2022-05-18 DIAGNOSIS — G894 Chronic pain syndrome: Secondary | ICD-10-CM | POA: Diagnosis not present

## 2022-05-18 DIAGNOSIS — I480 Paroxysmal atrial fibrillation: Secondary | ICD-10-CM | POA: Diagnosis not present

## 2022-05-18 DIAGNOSIS — R0609 Other forms of dyspnea: Secondary | ICD-10-CM | POA: Diagnosis not present

## 2022-05-18 DIAGNOSIS — R531 Weakness: Secondary | ICD-10-CM

## 2022-05-18 DIAGNOSIS — J9601 Acute respiratory failure with hypoxia: Secondary | ICD-10-CM | POA: Diagnosis not present

## 2022-05-18 DIAGNOSIS — I472 Ventricular tachycardia, unspecified: Secondary | ICD-10-CM | POA: Diagnosis not present

## 2022-05-18 DIAGNOSIS — E1144 Type 2 diabetes mellitus with diabetic amyotrophy: Secondary | ICD-10-CM | POA: Diagnosis not present

## 2022-05-18 DIAGNOSIS — I5023 Acute on chronic systolic (congestive) heart failure: Secondary | ICD-10-CM | POA: Diagnosis not present

## 2022-05-18 DIAGNOSIS — Z7984 Long term (current) use of oral hypoglycemic drugs: Secondary | ICD-10-CM | POA: Diagnosis not present

## 2022-05-18 DIAGNOSIS — I255 Ischemic cardiomyopathy: Secondary | ICD-10-CM | POA: Diagnosis not present

## 2022-05-18 DIAGNOSIS — R001 Bradycardia, unspecified: Secondary | ICD-10-CM | POA: Diagnosis not present

## 2022-05-18 DIAGNOSIS — G2581 Restless legs syndrome: Secondary | ICD-10-CM | POA: Diagnosis not present

## 2022-05-18 DIAGNOSIS — I110 Hypertensive heart disease with heart failure: Secondary | ICD-10-CM | POA: Diagnosis not present

## 2022-05-18 DIAGNOSIS — J441 Chronic obstructive pulmonary disease with (acute) exacerbation: Secondary | ICD-10-CM | POA: Diagnosis not present

## 2022-05-18 DIAGNOSIS — G7000 Myasthenia gravis without (acute) exacerbation: Secondary | ICD-10-CM | POA: Diagnosis not present

## 2022-05-18 DIAGNOSIS — I5042 Chronic combined systolic (congestive) and diastolic (congestive) heart failure: Secondary | ICD-10-CM | POA: Diagnosis not present

## 2022-05-18 DIAGNOSIS — E538 Deficiency of other specified B group vitamins: Secondary | ICD-10-CM | POA: Diagnosis not present

## 2022-05-18 DIAGNOSIS — I10 Essential (primary) hypertension: Secondary | ICD-10-CM | POA: Diagnosis not present

## 2022-05-18 DIAGNOSIS — E119 Type 2 diabetes mellitus without complications: Secondary | ICD-10-CM | POA: Diagnosis not present

## 2022-05-18 DIAGNOSIS — I161 Hypertensive emergency: Secondary | ICD-10-CM | POA: Diagnosis not present

## 2022-05-18 DIAGNOSIS — Z8674 Personal history of sudden cardiac arrest: Secondary | ICD-10-CM | POA: Diagnosis not present

## 2022-05-18 DIAGNOSIS — I422 Other hypertrophic cardiomyopathy: Secondary | ICD-10-CM | POA: Diagnosis not present

## 2022-05-18 DIAGNOSIS — I462 Cardiac arrest due to underlying cardiac condition: Secondary | ICD-10-CM | POA: Diagnosis not present

## 2022-05-18 DIAGNOSIS — E559 Vitamin D deficiency, unspecified: Secondary | ICD-10-CM | POA: Diagnosis not present

## 2022-05-18 DIAGNOSIS — I420 Dilated cardiomyopathy: Secondary | ICD-10-CM | POA: Diagnosis not present

## 2022-05-18 DIAGNOSIS — K219 Gastro-esophageal reflux disease without esophagitis: Secondary | ICD-10-CM | POA: Diagnosis not present

## 2022-05-18 DIAGNOSIS — I2510 Atherosclerotic heart disease of native coronary artery without angina pectoris: Secondary | ICD-10-CM | POA: Diagnosis not present

## 2022-05-18 DIAGNOSIS — Z743 Need for continuous supervision: Secondary | ICD-10-CM | POA: Diagnosis not present

## 2022-05-18 DIAGNOSIS — F419 Anxiety disorder, unspecified: Secondary | ICD-10-CM | POA: Diagnosis not present

## 2022-05-18 DIAGNOSIS — E782 Mixed hyperlipidemia: Secondary | ICD-10-CM | POA: Diagnosis not present

## 2022-05-18 LAB — GLUCOSE, CAPILLARY: Glucose-Capillary: 118 mg/dL — ABNORMAL HIGH (ref 70–99)

## 2022-05-18 LAB — BLOOD GAS, ARTERIAL
Acid-Base Excess: 8.2 mmol/L — ABNORMAL HIGH (ref 0.0–2.0)
Bicarbonate: 35.8 mmol/L — ABNORMAL HIGH (ref 20.0–28.0)
O2 Content: 2 L/min
O2 Saturation: 95.5 %
Patient temperature: 37
pCO2 arterial: 62 mmHg — ABNORMAL HIGH (ref 32–48)
pH, Arterial: 7.37 (ref 7.35–7.45)
pO2, Arterial: 69 mmHg — ABNORMAL LOW (ref 83–108)

## 2022-05-18 MED ORDER — DIAZEPAM 5 MG PO TABS: 5.0000 mg | ORAL_TABLET | Freq: Four times a day (QID) | ORAL | 0 refills | Status: DC | PRN

## 2022-05-18 MED ORDER — HYDROCODONE-ACETAMINOPHEN 5-325 MG PO TABS: 1.0000 | ORAL_TABLET | Freq: Four times a day (QID) | ORAL | 0 refills | Status: AC | PRN

## 2022-05-18 MED ORDER — OLANZAPINE 5 MG PO TBDP: 5.0000 mg | ORAL_TABLET | Freq: Two times a day (BID) | ORAL | Status: DC

## 2022-05-18 MED ORDER — HYDROCODONE-ACETAMINOPHEN 5-325 MG PO TABS: 1.0000 | ORAL_TABLET | Freq: Four times a day (QID) | ORAL | 0 refills | Status: DC | PRN

## 2022-05-18 MED ORDER — METOPROLOL SUCCINATE ER 50 MG PO TB24: 50.0000 mg | ORAL_TABLET | Freq: Every day | ORAL | 0 refills | Status: DC

## 2022-05-18 MED ORDER — DIGOXIN 125 MCG PO TABS: 0.1250 mg | ORAL_TABLET | Freq: Every day | ORAL | Status: AC

## 2022-05-18 MED ORDER — FUROSEMIDE 40 MG PO TABS: 40.0000 mg | ORAL_TABLET | Freq: Two times a day (BID) | ORAL | Status: DC

## 2022-05-18 MED ORDER — DIAZEPAM 5 MG PO TABS: 5.0000 mg | ORAL_TABLET | Freq: Four times a day (QID) | ORAL | 0 refills | Status: AC | PRN

## 2022-05-18 MED ORDER — HYDROCODONE-ACETAMINOPHEN 7.5-325 MG PO TABS
1.0000 | ORAL_TABLET | Freq: Once | ORAL | Status: AC
  Administered 2022-05-18: 1 via ORAL
  Filled 2022-05-18: qty 1

## 2022-05-18 MED ORDER — LOSARTAN POTASSIUM 25 MG PO TABS: 25.0000 mg | ORAL_TABLET | Freq: Every day | ORAL | Status: DC

## 2022-05-18 NOTE — Progress Notes (Signed)
Progress Note  Patient Name: Joseph Macdonald Date of Encounter: 05/18/2022  [facility] Cardiologist: [name], MD   Subjective   No complaints this morning, mild confusion Wife at the bedside Being picked up to transfer to [place] Denies significant chest pain or shortness of breath Reports he is still weak Telemetry with normal sinus rhythm rate 80s  Inpatient Medications    Scheduled Meds:  apixaban  5 mg Oral BID   vitamin C  500 mg Oral BID   chlorhexidine  15 mL Mouth Rinse BID   digoxin  0.125 mg Oral Daily   feeding supplement  237 mL Oral BID BM   furosemide  40 mg Oral BID   insulin aspart  0-20 Units Subcutaneous TID WC & HS   ipratropium-albuterol  3 mL Nebulization BID   losartan  25 mg Oral Daily   mouth rinse  15 mL Mouth Rinse q12n4p   metoprolol succinate  50 mg Oral Daily   multivitamin with minerals  1 tablet Oral Daily   nystatin ointment   Topical BID   OLANZapine zydis  5 mg Oral BID   pantoprazole  40 mg Oral Daily   pyridostigmine  30 mg Oral TID   sodium chloride flush  3 mL Intravenous Q12H   sodium chloride flush  3 mL Intravenous Q12H   sodium chloride flush  3 mL Intravenous Q12H   zinc sulfate  220 mg Oral Daily   Continuous Infusions:  sodium chloride Stopped (05/09/22 1434)   sodium chloride Stopped (05/17/22 0400)   PRN Meds: sodium chloride, sodium chloride, acetaminophen, diazepam, hydrALAZINE, polyethylene glycol, sodium chloride flush, sodium chloride flush   Vital Signs    Vitals:   05/18/22 0725 05/18/22 0810 05/18/22 1116 05/18/22 1204  BP:  133/64 114/71 (!) 156/67  Pulse:  (!) 102 86 62  Resp:      Temp:  98.1 F (36.7 C) 97.6 F (36.4 C) 98.1 F (36.7 C)  TempSrc:      SpO2: 91% 93% 91% 99%  Weight:      Height:        Intake/Output Summary (Last 24 hours) at 05/18/2022 1458 Last data filed at 05/18/2022 9798 Gross per 24 hour  Intake 240 ml  Output 1500 ml  Net -1260 ml      05/18/2022     5:00 AM 05/17/2022    5:00 AM 05/16/2022    1:11 PM  Last 3 Weights  Weight (lbs) 228 lb 14.8 oz 231 lb 7.7 oz 229 lb 15 oz  Weight (kg) 103.84 kg 105 kg 104.3 kg      Telemetry    Normal sinus rhythm rate 80 bpm- Personally Reviewed  ECG     - Personally Reviewed  Physical Exam   GEN:  Obese, laying supine, no distress Neck: No JVD Cardiac: RRR, no murmurs, rubs, or gallops.  Respiratory: Clear to auscultation bilaterally. GI: Soft, nontender, non-distended  MS: No edema; No deformity. Neuro:  Nonfocal  Psych: Normal affect   Labs    High Sensitivity Troponin:   Recent Labs  Lab 04/30/22 0332 04/30/22 0546 04/30/22 1734 04/30/22 1955  TROPONINIHS 373* 1,137* 2,844* 2,210*     Chemistry Recent Labs  Lab 05/15/22 0529 05/16/22 0826 05/17/22 0532  NA 140 140 140  K 4.2 4.3 4.0  CL 99 96* 95*  CO2 35* 39* 39*  GLUCOSE 113* 108* 126*  BUN 25* 30* 31*  CREATININE 0.88 0.85 0.88  CALCIUM 7.6* 8.4* 8.1*  GFRNONAA >60 >60 >60  ANIONGAP '6 5 6    '$ Lipids No results for input(s): "CHOL", "TRIG", "HDL", "LABVLDL", "LDLCALC", "CHOLHDL" in the last 168 hours.  Hematology Recent Labs  Lab 05/14/22 0559 05/15/22 0529 05/17/22 0532  WBC 10.8* 9.3 7.6  RBC 4.68 4.58 4.30  HGB 13.6 13.6 12.5*  HCT 45.3 45.0 42.8  MCV 96.8 98.3 99.5  MCH 29.1 29.7 29.1  MCHC 30.0 30.2 29.2*  RDW 13.7 13.7 13.5  PLT 178 157 212   Thyroid No results for input(s): "TSH", "FREET4" in the last 168 hours.  BNPNo results for input(s): "BNP", "PROBNP" in the last 168 hours.  DDimer No results for input(s): "DDIMER" in the last 168 hours.   Radiology    No results found.  Cardiac Studies   Echocardiogram Apr 30, 2022  1. Left ventricular ejection fraction, by estimation, is 35 to 40%. The  left ventricle has moderately decreased function. The left ventricle  demonstrates global hypokinesis. There is mild left ventricular  hypertrophy. Left ventricular diastolic  parameters are  consistent with Grade I diastolic dysfunction (impaired  relaxation).   2. Right ventricular systolic function is moderately reduced. The right  ventricular size is mildly enlarged.   3. The mitral valve is normal in structure. No evidence of mitral valve  regurgitation.   4. The aortic valve is grossly normal. Aortic valve regurgitation is not  visualized.    Cardiac catheterization May 16, 2022 Moderate single-vessel coronary artery disease with 60% mid LAD stenosis that is borderline significant by iFR (iFR = 0.90). No angiographically significant coronary artery disease observed in the LCx or RCA. Upper normal to mildly elevated left heart filling pressures (LVEDP and PCWP 18 mmHg). Moderate pulmonary hypertension (mean PAP 34 mmHg, PVR 2.9 WU).  Moderately-severely elevated right heart filling pressure (mean RAP 15 mmHg, RVEDP 14 mmHg). Normal Fick cardiac output/index (CO 5.5 L/min, CI 2.6 L/min/m).   Recommendations: Favor medical management of moderate LAD disease that is borderline significant by IFR but supplies a relatively small territory as the LV apex is perfused by branches from the distal RCA. Secondary prevention of coronary artery disease. Continue gentle diuresis.  Respiratory failure seems to be out of proportion to left heart failure.  Elevated pulmonary artery and right heart pressures are more consistent with primary pulmonary process driving the patient's respiratory failure. Restart IV heparin 2 hours after TR band has been removed.  Transition to [place] as soon as tomorrow if there is no evidence of bleeding/vascular injury at catheterization sites.   Patient Profile     76 year old gentleman with history of HFpEF, hypertension, VT, paroxysmal A-fib, hyperlipidemia, COPD, myasthenia gravis who presents to the hospital due to worsening shortness of breath, underwent respiratory failure requiring intubation.  Being seen for moderately reduced ejection fraction EF 35  to 40%.  Assessment & Plan    Cardiomyopathy Nonischemic based off catheterization with nonobstructive coronary disease, 60% LAD, medical management recommended On low-dose digoxin 0.125 daily, Lasix 40 twice daily, losartan 25 daily, metoprolol succinate 50 daily As outpatient if blood pressure allows would consider transition to [facility]  Paroxysmal atrial fibrillation On digoxin, metoprolol Calcium channel blocker and flecainide on hold secondary to cardiomyopathy For recurrent atrial tachyarrhythmia, could consider cardioversion, plus minus amiodarone  COPD/respiratory failure Respiratory arrest upon arrival to the emergency room, CT scan with aspiration pneumonia Required intubation, long recovery, bronchoscopy for removing bloody mucous plugs, extubated May 06, 2022 In the setting of  possible myasthenia gravis, started on IVIG May 10, 2022 Neurology following Aggressive diuresis this admission Long history of smoking contributing  Long discussion with patient and patient's wife concerning discharge follow-up  Total encounter time more than 50 minutes  Greater than 50% was spent in counseling and coordination of care with the patient    For questions or updates, please contact [place] Please consult [email or web address] for contact info under        Signed, [name], MD  05/18/2022, 2:58 PM

## 2022-05-18 NOTE — Care Management Important Message (Signed)
Important Message  Patient Details  Name: Joseph Macdonald MRN: [id number] Date of Birth: 08-May-1946   Medicare Important Message Given:  Yes     Dannette Barbara 05/18/2022, 1:35 PM

## 2022-05-18 NOTE — Discharge Summary (Signed)
Physician Discharge Summary   Patient: Joseph Macdonald MRN: [id number] DOB: 01/09/1946  Admit date:     04/30/2022  Discharge date: 05/18/22  Discharge Physician: Max Sane   PCP: [name], MD   Recommendations at discharge:   Follow-up with outpatient providers as requested Palliative care to follow as an outpatient with consideration for transition to hospice if and when appropriate  Discharge Diagnoses: Principal Problem:   Cardiac arrest due to respiratory disorder [facility]) Active Problems:   Acute respiratory failure with hypoxia ([place])   Acute on chronic HFrEF (heart failure with reduced ejection fraction) (HCC)   Generalized weakness   COPD (chronic obstructive pulmonary disease) ([place])   Hypertensive emergency   Paroxysmal atrial fibrillation (HCC)   Pressure injury of skin   Morbid obesity [facility])   Hospital Course: 76 year old male with past medical history of morbid obesity, myasthenia gravis, COPD, systolic/diastolic CHF, diabetes mellitus and atrial fibrillation on Eliquis presented to the emergency room on 5/21 with hypoxia requiring nonrebreather and then suffered a cardiac arrest upon arrival to the emergency room.  Patient able to be resuscitated.  CT scan revealed aspiration pneumonia.  Patient admitted to the critical care service and placed on ventilator support.  Initially extubated 5/24 but reintubated same day.  Underwent bronchoscopy removing bloody mucous plugs.  Able to be extubated on 5/27.  Patient transferred out of ICU on 5/29.  Cardiology, neurology and palliative care have been following as well.  Patient also looks to be having an acute flareup of his myasthenia gravis and was started on IVIG as of 5/31.  With IVIG, patient has also been started on aggressive diuresis and is currently diuresed over 19 L and is more than 8.5 L deficient.  Plan is for cardiac catheterization today, 6/6.  6/7 - lost [facility] per TOC, plan for [facility]  tomorrow   Assessment and Plan: * Cardiac arrest due to respiratory disorder [facility]) Patient with chest wall pain.  No evidence of fractures on x-ray.  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  Status post ventilator support.  Improved some with steroids, antibiotics and nebulizers. He will need about 4 L nasal cannula oxygen at discharge  Acute on chronic HFrEF (heart failure with reduced ejection fraction) (HCC) Echocardiogram done 5/21 notes ejection fraction of 35 to 40% and grade 1 diastolic dysfunction as well as moderately reduced right ventricular systolic function.  Diuresed with Lasix Net IO Since Admission: -19,458.31 mL [05/18/22 0745]   Generalized weakness Multifactorial.  Patient with history of myasthenia gravis and weakness thought to be flare, so has been put on Mestinon started course of IVIG.  Despite completion of IVIG, still some residual weakness so not felt to be a myasthenia gravis flare. PT, OT -> SNF  Hypertensive emergency Now resolved. Continue losartan, lasix and metoprolol  Paroxysmal atrial fibrillation (HCC) Currently in sinus rhythm.  Continue digoxin.  Metoprolol for rate control, Eliquis for anticoagulation.   Pressure injury of skin Pressure Injury Buttocks Right Stage 2 -  Partial thickness loss of dermis presenting as a shallow open injury with a red, pink wound bed without slough. (Active)     Location: Buttocks  Location Orientation: Right  Staging: Stage 2 -  Partial thickness loss of dermis presenting as a shallow open injury with a red, pink wound bed without slough.  Wound Description (Comments):   Present on  Admission: No   Stage II right buttock ulcer, not present on admission.     Morbid obesity ([place]) Meets criteria BMI greater than 30 and comorbidities of heart failure and hypertension.  Complicates overall prognosis.          Consultants: Cardiology, neurology, critical care Procedures performed: Bronchoscopy on 5/24, emergent intubation on 5/24, EEG on 5/22, A line on 5/21, central line on 5/21, cardiac cath on 6/6 Disposition: Skilled nursing facility Diet recommendation:  Dysphagia type 3 nectar Liquid DISCHARGE MEDICATION: Allergies as of 05/18/2022   No Known Allergies      Medication List     STOP taking these medications    famotidine 20 MG tablet Commonly known as: PEPCID   flecainide 50 MG tablet Commonly known as: TAMBOCOR   torsemide 20 MG tablet Commonly known as: DEMADEX       TAKE these medications    albuterol 108 (90 Base) MCG/ACT inhaler Commonly known as: VENTOLIN HFA Inhale into the lungs every 6 (six) hours as needed for wheezing or shortness of breath.   apixaban 5 MG Tabs tablet Commonly known as: ELIQUIS Take 5 mg by mouth 2 (two) times daily.   budesonide-formoterol 160-4.5 MCG/ACT inhaler Commonly known as: SYMBICORT Inhale 2 puffs into the lungs 2 (two) times daily.   cholecalciferol 25 MCG (1000 UNIT) tablet Commonly known as: VITAMIN D Take 2,000 Units by mouth daily.   diazepam 5 MG tablet Commonly known as: VALIUM Take 1 tablet (5 mg total) by mouth every 6 (six) hours as needed for up to 3 days for anxiety.   digoxin 0.125 MG tablet Commonly known as: LANOXIN Take 1 tablet (0.125 mg total) by mouth daily.   empagliflozin 25 MG Tabs tablet Commonly known as: JARDIANCE Take 25 mg by mouth daily.   furosemide 40 MG tablet Commonly known as: LASIX Take 1 tablet (40 mg total) by mouth 2 (two) times daily.   HYDROcodone-acetaminophen 5-325 MG tablet Commonly known as: NORCO/VICODIN Take 1 tablet by mouth every 6 (six) hours as needed for up to 3 days for moderate pain.   losartan 25 MG tablet Commonly known as: COZAAR Take 1 tablet (25 mg total) by mouth daily.   metoprolol succinate 50 MG 24 hr tablet Commonly known as: TOPROL-XL Take 1  tablet (50 mg total) by mouth daily. Take with or immediately following a meal.   OLANZapine zydis 5 MG disintegrating tablet Commonly known as: ZYPREXA Take 1 tablet (5 mg total) by mouth 2 (two) times daily.   omeprazole 40 MG capsule Commonly known as: PRILOSEC Take 40 mg by mouth 2 (two) times daily.   pramipexole 0.5 MG tablet Commonly known as: MIRAPEX Take 1 tablet (0.5 mg total) by mouth at bedtime.   pravastatin 20 MG tablet Commonly known as: PRAVACHOL Take 20 mg by mouth daily.   predniSONE 20 MG tablet Commonly known as: DELTASONE Take 15 mg by mouth daily with breakfast.   pyridostigmine 60 MG tablet Commonly known as: MESTINON Take 30 mg by mouth 3 (three) times daily.   terbinafine 250 MG tablet Commonly known as: LAMISIL Take 250 mg by mouth daily.   tiotropium 18 MCG inhalation capsule Commonly known as: SPIRIVA Place 18 mcg into inhaler and inhale daily.   vitamin B-12 1000 MCG tablet Commonly known as: CYANOCOBALAMIN Take 1,000 mcg by mouth daily.               Discharge Care Instructions  (From admission, onward)  Start     Ordered   05/18/22 0000  Discharge wound care:       Comments: Dry dressing every 3 days   05/18/22 0745            Follow-up Information     [name], MD. Schedule an appointment as soon as possible for a visit in 1 week(s).   Specialty: Family Medicine Why: [facility] Discharge F/UP Contact information: [address] [place] [address] [phone]         Minna Merritts, MD. Schedule an appointment as soon as possible for a visit in 2 week(s).   Specialty: Cardiology Why: [facility] Discharge F/UP Contact information: [place] [address] [phone]                Discharge Exam: Danley Danker Weights   05/16/22 1311 05/17/22 0500 05/18/22 0500  Weight: 104.3 kg 105 kg 103.8 kg   General:  no acute  distress HEENT: Normocephalic, atraumatic, mucous membranes are slightly dry Cardiovascular: Regular rhythm W9-V9, 2/6 systolic ejection murmur Respiratory: Decreased breath sounds throughout Abdomen: Soft, obese, nontender, positive bowel sounds Musculoskeletal: No clubbing or cyanosis, trace pitting edema Skin: No skin breaks, tears or lesions Psychiatry: Appropriate, no evidence of psychoses    Condition at discharge: fair  The results of significant diagnostics from this hospitalization (including imaging, microbiology, ancillary and laboratory) are listed below for reference.   Imaging Studies: CARDIAC CATHETERIZATION  Result Date: 05/16/2022 Conclusions: Moderate single-vessel coronary artery disease with 60% mid LAD stenosis that is borderline significant by iFR (iFR = 0.90). No angiographically significant coronary artery disease observed in the LCx or RCA. Upper normal to mildly elevated left heart filling pressures (LVEDP and PCWP 18 mmHg). Moderate pulmonary hypertension (mean PAP 34 mmHg, PVR 2.9 WU).  Moderately-severely elevated right heart filling pressure (mean RAP 15 mmHg, RVEDP 14 mmHg). Normal Fick cardiac output/index (CO 5.5 L/min, CI 2.6 L/min/m). Recommendations: Favor medical management of moderate LAD disease that is borderline significant by IFR but supplies a relatively small territory as the LV apex is perfused by branches from the distal RCA. Secondary prevention of coronary artery disease. Continue gentle diuresis.  Respiratory failure seems to be out of proportion to left heart failure.  Elevated pulmonary artery and right heart pressures are more consistent with primary pulmonary process driving the patient's respiratory failure. Restart IV heparin 2 hours after TR band has been removed.  Transition to [place] as soon as tomorrow if there is no evidence of bleeding/vascular injury at catheterization sites. Nelva Bush, MD [facility]  DG Knee 1-2 Views  Left  Result Date: 05/14/2022 CLINICAL DATA:  Left knee pain without injury EXAM: LEFT KNEE - 1-2 VIEW COMPARISON:  None Available. FINDINGS: Vascular calcification. Tricompartmental degenerative changes with joint space narrowing medially. Probable small to moderate suprapatellar joint effusion. Soft tissue swelling above the knee. No other acute abnormalities. IMPRESSION: Degenerative changes as above. Suprapatellar joint effusion. Vascular calcifications. Electronically Signed   By: Dorise Bullion III M.D.   On: 05/14/2022 11:38   DG Chest Port 1 View  Result Date: 05/13/2022 CLINICAL DATA:  Dyspnea EXAM: PORTABLE CHEST 1 VIEW COMPARISON:  05/09/2022 FINDINGS: Stable heart size. Aortic atherosclerosis. Low lung volumes. Persistent small bilateral pleural effusions with similar streaky bibasilar opacities, likely atelectasis. No pneumothorax. IMPRESSION: Persistent small bilateral pleural effusions with similar streaky bibasilar opacities, likely atelectasis. Electronically Signed   By: Davina Poke D.O.  On: 05/13/2022 11:08   DG Chest Port 1 View  Result Date: 05/09/2022 CLINICAL DATA:  Respiratory distress and failure. EXAM: PORTABLE CHEST 1 VIEW COMPARISON:  Portable chest 05/04/2022. FINDINGS: Interval extubation, removal NGT. The heart is enlarged. The central vessels are normal caliber. There is a stable mediastinum with aortic atherosclerosis. There are small pleural effusions with persistent patchy consolidation in the left lower lobe but showing some improvement, with increased atelectasis or consolidation in the right base and increased right pleural fluid. The mid and upper lung fields remain clear. In all other respects there are no further changes. Thoracic spondylosis. IMPRESSION: 1. Persistent patchy consolidation in the left lower lobe and small left pleural effusion with improved opacities. 2. Increased basal consolidation or atelectasis and small pleural effusion on the right. 3.  Cardiomegaly, aortic atherosclerosis. Electronically Signed   By: Telford Nab M.D.   On: 05/09/2022 03:59   DG Chest Port 1 View  Result Date: 05/04/2022 CLINICAL DATA:  Hypoxia. EXAM: PORTABLE CHEST 1 VIEW COMPARISON:  Prior chest radiographs 05/03/2022 and earlier. FINDINGS: ET tube present with tip terminating 6 cm above the level of the carina. Esophageal temperature probe. An enteric tube passes below level of left hemidiaphragm and coils within the stomach. Cardiomegaly. Aortic atherosclerosis. Persistent opacity within the left mid to lower lung field, which may reflect a pleural effusion, atelectasis and/or consolidation. No appreciable airspace consolidation within the right lung. No evidence of pneumothorax. No acute bony abnormality identified. IMPRESSION: Support apparatus, as described. Persistent opacity within the left mid to lower lung field, which may reflect a pleural effusion, atelectasis and/or consolidation. Cardiomegaly. Aortic Atherosclerosis (ICD10-I70.0). Electronically Signed   By: Kellie Simmering D.O.   On: 05/04/2022 10:30   DG Chest Port 1 View  Result Date: 05/03/2022 CLINICAL DATA:  Respiratory failure EXAM: PORTABLE CHEST 1 VIEW COMPARISON:  05/03/2022 FINDINGS: Endotracheal tube, enteric tube, and temperature probe are unchanged in position since the previous study. Shallow inspiration. Cardiac enlargement without vascular congestion. Improved aeration of the left lung with mild residual atelectasis or effusion in the lower left chest. Significant improvement of aeration in the left upper lung. Right lung is clear and expanded. IMPRESSION: Appliances appear in satisfactory position. Improved aeration of the left lung with residual basilar atelectasis and small effusion. Electronically Signed   By: Lucienne Capers M.D.   On: 05/03/2022 18:01   DG Chest Port 1 View  Result Date: 05/03/2022 CLINICAL DATA:  Status post intubation. EXAM: PORTABLE CHEST 1 VIEW COMPARISON:   05/03/2022 FINDINGS: The ET tube tip is identified 2.7 cm above the carina. There is a OG tube with tip in the left upper quadrant of the abdomen in the expected location of the gastric fundus. Interval complete opacification of the left hemithorax which is suspected to represent mucous plugging. Mild interstitial edema identified in the right lung. IMPRESSION: 1. Interval complete opacification of the left hemithorax, suspected to represent mucous plugging. 2. Satisfactory position of the ET tube and OG tube. Electronically Signed   By: Kerby Moors M.D.   On: 05/03/2022 13:36   DG Chest Port 1 View  Result Date: 05/03/2022 CLINICAL DATA:  Acute respiratory failure with hypoxia. EXAM: PORTABLE CHEST 1 VIEW COMPARISON:  Apr 30, 2022. FINDINGS: Stable cardiomegaly. Nasogastric tube tip is seen in the proximal stomach. Bilateral interstitial densities are noted concerning for pulmonary edema. Left pleural effusion is noted. Minimal right basilar subsegmental atelectasis is noted. Bony thorax is unremarkable. IMPRESSION: Probable bilateral pulmonary edema.  Mild left pleural effusion is noted. Minimal right basilar subsegmental atelectasis is noted. Electronically Signed   By: Marijo Conception M.D.   On: 05/03/2022 07:58   CT ABDOMEN PELVIS WO CONTRAST  Result Date: 05/02/2022 CLINICAL DATA:  Bowel obstruction suspected. EXAM: CT ABDOMEN AND PELVIS WITHOUT CONTRAST TECHNIQUE: Multidetector CT imaging of the abdomen and pelvis was performed following the standard protocol without IV contrast. RADIATION DOSE REDUCTION: This exam was performed according to the departmental dose-optimization program which includes automated exposure control, adjustment of the mA and/or kV according to patient size and/or use of iterative reconstruction technique. COMPARISON:  CT examination dated June 10, 2019 FINDINGS: Lower chest: Small bilateral pleural effusions with associated atelectasis, right greater than the left.  Hepatobiliary: No focal liver abnormality is seen. No gallstones, gallbladder wall thickening, or biliary dilatation. Pancreas: Moderate pancreatic atrophy. No pancreatic ductal dilatation or surrounding inflammatory changes. Spleen: Normal in size without focal abnormality. Calcified granuloma in the spleen is noted. Adrenals/Urinary Tract: Adrenal glands are unremarkable. Kidneys are normal, without renal calculi, focal lesion, or hydronephrosis. Nonspecific bilateral perinephric fat stranding. Urinary bladder is collapsed about the [facility] catheter. Stomach/Bowel: Stomach is within normal limits. NG tube with distal tip in the body of the stomach. Appendix appears normal. No evidence of bowel wall thickening, distention, or inflammatory changes. Scattered colonic diverticuli without evidence of acute diverticulitis. Vascular/Lymphatic: Aortic atherosclerosis. No enlarged abdominal or pelvic lymph nodes. Right femoral access central line. Reproductive: Prostate is unremarkable. Other: No abdominal wall hernia or abnormality. No abdominopelvic ascites. Mild generalized anasarca suggesting fluid overload. 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 IMPRESSION: 1. Bowel loops are normal in caliber. Scattered colonic diverticula without evidence of acute diverticulitis. 2. Small bilateral pleural effusions with associated atelectasis, right greater than the left. 3.  Mild generalized anasarca. 4.  No CT evidence of acute abdominal/pelvic process. 5.  Additional chronic findings as above. Electronically Signed   By: Keane Police D.O.   On: 05/02/2022 14:32   MR BRAIN WO CONTRAST  Result Date: 05/02/2022 CLINICAL DATA:  Anoxic brain injury EXAM: MRI HEAD WITHOUT CONTRAST TECHNIQUE: Multiplanar, multiecho pulse sequences of the brain and surrounding structures were obtained without intravenous contrast. COMPARISON:  CT head 05/01/2022  FINDINGS: Brain: Negative for acute infarct. No evidence of acute anoxic injury. Ventricle size normal. Moderate white matter changes with patchy hyperintensity throughout the white matter. Mild hyperintensity in the pons. Negative for hemorrhage or mass. Vascular: Normal arterial flow voids. Skull and upper cervical spine: Negative Sinuses/Orbits: Paranasal sinuses clear. Other: None IMPRESSION: No acute abnormality.  Negative for acute anoxic brain injury Moderate chronic microvascular ischemic change in the white matter and pons. Electronically Signed   By: Franchot Gallo M.D.   On: 05/02/2022 14:20   EEG adult  Result Date: 05/01/2022 Derek Jack, MD     05/01/2022  9:15 PM Routine EEG Report SABIEN UMLAND is a 76 y.o. male with a history of cardiac arrest who is undergoing an EEG to evaluate for seizures. Report: This EEG was acquired with electrodes placed according to the International 10-20 electrode system (including Fp1, Fp2, F3, F4, C3, C4, P3, P4, O1, O2, T3, T4, T5, T6, A1, A2, Fz, Cz, Pz). The following electrodes were missing or displaced: none. The best background was continuous at 5-7 Hz. This activity is reactive to stimulation. No sleep architecture was identified. There was no focal slowing. There were no interictal epileptiform  discharges. There were no electrographic seizures identified. Photic stimulation and hyperventilation were not performed. Impression and clinical correlation: This EEG was obtained while sedated on midazolam and fentanyl and was abnormal due to mild-to-moderate diffuse slowing indicative of global cerebral dysfunction. Epileptiform abnormalities were not seen during this recording. Su Monks, MD [facility] [phone] If 7pm- 7am, please page neurology on call as listed in [place].   CT HEAD WO CONTRAST (5MM)  Result Date: 05/01/2022 CLINICAL DATA:  76 year old male status post cardiac arrest and CPR. Altered mental status despite weaning  sedation. Query anoxic injury. EXAM: CT HEAD WITHOUT CONTRAST TECHNIQUE: Contiguous axial images were obtained from the base of the skull through the vertex without intravenous contrast. RADIATION DOSE REDUCTION: This exam was performed according to the departmental dose-optimization program which includes automated exposure control, adjustment of the mA and/or kV according to patient size and/or use of iterative reconstruction technique. COMPARISON:  Head CTs 04/30/2022 and 11/29/2019. FINDINGS: Brain: Cerebral volume is stable since 2020. No midline shift, mass effect, or evidence of intracranial mass lesion. No ventriculomegaly No acute intracranial hemorrhage identified. Confluent bilateral cerebral white matter hypodensity, more pronounced in both hemispheres since 2020 although in a similar pattern to that time. Gray-white differentiation at the deep gray nuclei appears preserved, although with some right side thalamic and bilateral basal ganglia heterogeneity. No definite brainstem or cerebellar edema. No cortically based acute infarct identified. Vascular: Calcified atherosclerosis at the skull base. No suspicious intracranial vascular hyperdensity. Skull: No acute osseous abnormality identified. Sinuses/Orbits: Paranasal sinuses, tympanic cavities and mastoids are well aerated. Other: Partially visible endotracheal tube and oral enteric tube. The enteric tube is looped in the visible oral cavity and pharynx. Postoperative changes to the globes. No acute orbit or scalp soft tissue finding. IMPRESSION: 1. No strong CT evidence of anoxic cerebral injury at this time. Sensitivity is somewhat reduced by evidence of underlying chronic small vessel disease, which does appear progressed in both hemispheres compared to 2020. Repeat Head CT in another 24-48 hours, or alternatively follow-up Brain MRI, would be valuable. 2. Oral enteric tube looped in the oral cavity and pharynx. Recommend repositioning.  Electronically Signed   By: Genevie Ann M.D.   On: 05/01/2022 06:20   DG Abd 1 View  Result Date: 04/30/2022 CLINICAL DATA:  OG tube placement EXAM: ABDOMEN - 1 VIEW COMPARISON:  04/30/2022 FINDINGS: OG tube tip is in the stomach.  Nonobstructive bowel gas pattern. IMPRESSION: OG tube tip in the stomach. Electronically Signed   By: Rolm Baptise M.D.   On: 04/30/2022 22:12   ECHOCARDIOGRAM COMPLETE  Result Date: 04/30/2022    ECHOCARDIOGRAM REPORT   Patient Name:   GIANMARCO ROYE Nijjar Date of Exam: 04/30/2022 Medical Rec #:  [id number]         Height:       67.0 in Accession #:    [id number]        Weight:       231.4 lb Date of Birth:  24-Apr-1946         BSA:          2.151 m Patient Age:    12 years          BP:           176/107 mmHg Patient Gender: M                 HR:           77 bpm. Exam Location:  [place]  Procedure: 2D Echo and Intracardiac Opacification Agent Indications:     Cardiac Arrest I46.9  History:         Patient has prior history of Echocardiogram examinations, most                  recent 04/20/2022.  Sonographer:     Kathlen Brunswick RDCS Referring Phys:  [id number] [place] L [place] Diagnosing Phys: Kate Sable MD  Sonographer Comments: Technically difficult study due to poor echo windows and echo performed with patient supine and on artificial respirator. Image acquisition challenging due to respiratory motion and Image acquisition challenging due to patient body habitus. IMPRESSIONS  1. Left ventricular ejection fraction, by estimation, is 35 to 40%. The left ventricle has moderately decreased function. The left ventricle demonstrates global hypokinesis. There is mild left ventricular hypertrophy. Left ventricular diastolic parameters are consistent with Grade I diastolic dysfunction (impaired relaxation).  2. Right ventricular systolic function is moderately reduced. The right ventricular size is mildly enlarged.  3. The mitral valve is normal in structure. No evidence of mitral valve  regurgitation.  4. The aortic valve is grossly normal. Aortic valve regurgitation is not visualized. FINDINGS  Left Ventricle: Left ventricular ejection fraction, by estimation, is 35 to 40%. The left ventricle has moderately decreased function. The left ventricle demonstrates global hypokinesis. Definity contrast agent was given IV to delineate the left ventricular endocardial borders. The left ventricular internal cavity size was normal in size. There is mild left ventricular hypertrophy. Abnormal (paradoxical) septal motion, consistent with left bundle branch block. Left ventricular diastolic parameters are consistent with Grade I diastolic dysfunction (impaired relaxation). Right Ventricle: The right ventricular size is mildly enlarged. No increase in right ventricular wall thickness. Right ventricular systolic function is moderately reduced. Left Atrium: Left atrial size was normal in size. Right Atrium: Right atrial size was normal in size. Pericardium: There is no evidence of pericardial effusion. Mitral Valve: The mitral valve is normal in structure. No evidence of mitral valve regurgitation. Tricuspid Valve: The tricuspid valve is not well visualized. Tricuspid valve regurgitation is not demonstrated. Aortic Valve: The aortic valve is grossly normal. Aortic valve regurgitation is not visualized. Aortic valve peak gradient measures 3.4 mmHg. Pulmonic Valve: The pulmonic valve was not well visualized. Pulmonic valve regurgitation is not visualized. Aorta: The aortic root and ascending aorta are structurally normal, with no evidence of dilitation. Venous: The inferior vena cava was not well visualized. IAS/Shunts: No atrial level shunt detected by color flow Doppler.  LEFT VENTRICLE PLAX 2D LVIDd:         4.50 cm     Diastology LVIDs:         3.50 cm     LV e' medial:    5.77 cm/s LV PW:         1.30 cm     LV E/e' medial:  3.9 LV IVS:        1.30 cm     LV e' lateral:   5.87 cm/s LVOT diam:     2.10 cm     LV  E/e' lateral: 3.8 LV SV:         32 LV SV Index:   15 LVOT Area:     3.46 cm  LV Volumes (MOD) LV vol d, MOD A2C: 68.5 ml LV vol d, MOD A4C: 98.5 ml LV vol s, MOD A2C: 35.9 ml LV vol s, MOD A4C: 53.4 ml LV SV MOD A2C:     32.6 ml  LV SV MOD A4C:     98.5 ml LV SV MOD BP:      38.3 ml RIGHT VENTRICLE RV Basal diam:  3.70 cm RV S prime:     9.90 cm/s TAPSE (M-mode): 1.3 cm LEFT ATRIUM             Index        RIGHT ATRIUM           Index LA diam:        3.40 cm 1.58 cm/m   RA Area:     14.80 cm LA Vol (A2C):   35.1 ml 16.31 ml/m  RA Volume:   38.90 ml  18.08 ml/m LA Vol (A4C):   54.3 ml 25.24 ml/m LA Biplane Vol: 48.3 ml 22.45 ml/m  AORTIC VALVE                 PULMONIC VALVE AV Area (Vmax): 2.30 cm     PV Vmax:       0.58 m/s AV Vmax:        92.30 cm/s   PV Peak grad:  1.4 mmHg AV Peak Grad:   3.4 mmHg LVOT Vmax:      61.40 cm/s LVOT Vmean:     46.400 cm/s LVOT VTI:       0.093 m  AORTA Ao Root diam: 3.60 cm Ao Asc diam:  3.60 cm MITRAL VALVE MV Area (PHT): 3.26 cm    SHUNTS MV Decel Time: 233 msec    Systemic VTI:  0.09 m MV E velocity: 22.30 cm/s  Systemic Diam: 2.10 cm MV A velocity: 51.00 cm/s MV E/A ratio:  0.44 Kate Sable MD Electronically signed by Kate Sable MD Signature Date/Time: 04/30/2022/1:28:45 PM    Final    DG Abd 1 View  Result Date: 04/30/2022 CLINICAL DATA:  Repositioning of enteric tube EXAM: ABDOMEN - 1 VIEW COMPARISON:  Study done earlier today FINDINGS: There is interval repositioning of enteric tube with its tip in the region of fundus/body of the stomach. Increased density in the left lower lung fields may suggest infiltrate and pleural effusion. Bowel gas pattern in the upper abdomen is unremarkable. IMPRESSION: Tip enteric tube is more caudal in position in the area of fundus/body of the stomach. Electronically Signed   By: Elmer Picker M.D.   On: 04/30/2022 10:50   CT CHEST WO CONTRAST  Result Date: 04/30/2022 CLINICAL DATA:  Difficulty breathing EXAM:  CT CHEST WITHOUT CONTRAST TECHNIQUE: Multidetector CT imaging of the chest was performed following the standard protocol without IV contrast. RADIATION DOSE REDUCTION: This exam was performed according to the departmental dose-optimization program which includes automated exposure control, adjustment of the mA and/or kV according to patient size and/or use of iterative reconstruction technique. COMPARISON:  Previous studies including the chest radiograph done earlier today FINDINGS: Cardiovascular: Coronary artery calcifications are seen. Heart is enlarged in size. There is ectasia of ascending thoracic aorta measuring 4 cm. There is ectasia of main pulmonary artery measuring 3.7 cm suggesting pulmonary arterial hypertension. Mediastinum/Nodes: No significant lymphadenopathy seen.There are small pockets of air in the some of the venous structures, possibly introduced during venipuncture. Tip of endotracheal tube is at the level of aortic arch. Enteric tube is noted traversing the esophagus with its tip in the stomach. Lungs/Pleura: There are small patchy infiltrates in the right upper lobe, right middle lobe and left upper lobe. Dense infiltrates are seen in the both lower lobes with air bronchogram. Small right pleural  effusion is seen. There is no pneumothorax. Upper Abdomen: There is subtle increased density in gallbladder lumen. Gallbladder is not distended. Musculoskeletal: Unremarkable. IMPRESSION: There scattered small patchy infiltrates in both lungs, more so in the right upper lobe suggesting multifocal pneumonia. Dense infiltrates with air bronchogram are noted in both lower lobes suggesting atelectasis/pneumonia. Cardiomegaly. Coronary artery calcifications are seen. There is ectasia of main pulmonary artery suggesting pulmonary arterial hypertension. There is ectasia of ascending thoracic aorta measuring 4 cm. Possible gallbladder stones. Electronically Signed   By: Elmer Picker M.D.   On:  04/30/2022 09:53   CT HEAD WO CONTRAST (5MM)  Result Date: 04/30/2022 CLINICAL DATA:  Mental status change. EXAM: CT HEAD WITHOUT CONTRAST TECHNIQUE: Contiguous axial images were obtained from the base of the skull through the vertex without intravenous contrast. RADIATION DOSE REDUCTION: This exam was performed according to the departmental dose-optimization program which includes automated exposure control, adjustment of the mA and/or kV according to patient size and/or use of iterative reconstruction technique. COMPARISON:  November 29, 2019 FINDINGS: Brain: No subdural, epidural, or subarachnoid hemorrhage. Cerebellum, brainstem, and basal cisterns are normal. Scattered white matter changes again identified. No acute cortical ischemia or infarct. No mass effect or midline shift. Vascular: No hyperdense vessel or unexpected calcification. Skull: Normal. Negative for fracture or focal lesion. Sinuses/Orbits: No acute finding. Other: None. IMPRESSION: Chronic white matter changes.  No acute intracranial abnormalities. Electronically Signed   By: Dorise Bullion III M.D.   On: 04/30/2022 09:44   DG Abd 1 View  Result Date: 04/30/2022 CLINICAL DATA:  76 year old male status post nasogastric tube placement. EXAM: ABDOMEN - 1 VIEW COMPARISON:  No priors. FINDINGS: Tip of nasogastric tube is in the stomach. Side port is at or immediately proximal to the gastroesophageal junction. Visualized bowel gas pattern is nonobstructive. Transcutaneous defibrillator pad projects over the epigastric region. IMPRESSION: 1. Nasogastric tube is in a slightly high position and could be advanced approximately 10 cm for more optimal placement. Electronically Signed   By: Vinnie Langton M.D.   On: 04/30/2022 07:15   DG Chest Portable 1 View  Result Date: 04/30/2022 CLINICAL DATA:  Chest pain, cardiopulmonary arrest EXAM: PORTABLE CHEST 1 VIEW COMPARISON:  None Available. FINDINGS: Endotracheal tube is seen 4.9 cm above the  carina. Pulmonary insufflation is normal and symmetric. Mild interstitial pulmonary edema is present. No confluent pulmonary infiltrate. No pneumothorax or pleural effusion. Cardiac size is within normal limits when accounting for supine positioning and sub maximal pulmonary insufflation. IMPRESSION: Endotracheal tube in appropriate position. Mild interstitial pulmonary edema. Electronically Signed   By: Fidela Salisbury M.D.   On: 04/30/2022 03:34   ECHOCARDIOGRAM COMPLETE  Result Date: 04/20/2022    ECHOCARDIOGRAM REPORT   Patient Name:   MAT STUARD Nier Date of Exam: 04/20/2022 Medical Rec #:  [id number]         Height:       67.0 in Accession #:    [id number]        Weight:       231.4 lb Date of Birth:  08-14-1946         BSA:          2.151 m Patient Age:    78 years          BP:           142/94 mmHg Patient Gender: M                 HR:  62 bpm. Exam Location:  [facility] Procedure: 2D Echo, Cardiac Doppler, Color Doppler and Intracardiac            Opacification Agent Indications:    I50.20* Unspecified systolic (congestive) heart failure; R06.02                 SOB; R07.9* Chest pain, unspecified  History:        Patient has no prior history of Echocardiogram examinations.                 CHF, COPD, Arrythmias:Tachycardia, Signs/Symptoms:Chest Pain,                 Shortness of Breath and Edema; Risk Factors:Hypertension and                 Former Smoker.  Sonographer:    Pilar Jarvis RDMS, RVT, RDCS Referring Phys: [place]  1. Challenging study, PVCs noted.  2. Left ventricular ejection fraction, by estimation, is 45 to 50%. The left ventricle has mildly decreased function. The left ventricle has no regional wall motion abnormalities. There is mild left ventricular hypertrophy. Left ventricular diastolic parameters are consistent with Grade I diastolic dysfunction (impaired relaxation).  3. Right ventricular systolic function is mildly reduced. The right ventricular  size is normal. Tricuspid regurgitation signal is inadequate for assessing PA pressure.  4. The mitral valve is normal in structure. Mild to moderate mitral valve regurgitation. No evidence of mitral stenosis.  5. The aortic valve is normal in structure. Aortic valve regurgitation is mild. No aortic stenosis is present.  6. There is borderline dilatation of the aortic root, measuring 37 mm. There is mild dilatation of the ascending aorta, measuring 42 mm.  7. The inferior vena cava is normal in size with greater than 50% respiratory variability, suggesting right atrial pressure of 3 mmHg. FINDINGS  Left Ventricle: Left ventricular ejection fraction, by estimation, is 45 to 50%. The left ventricle has mildly decreased function. The left ventricle has no regional wall motion abnormalities. Definity contrast agent was given IV to delineate the left ventricular endocardial borders. The left ventricular internal cavity size was normal in size. There is mild left ventricular hypertrophy. Left ventricular diastolic parameters are consistent with Grade I diastolic dysfunction (impaired relaxation). Right Ventricle: The right ventricular size is normal. No increase in right ventricular wall thickness. Right ventricular systolic function is mildly reduced. Tricuspid regurgitation signal is inadequate for assessing PA pressure. Left Atrium: Left atrial size was normal in size. Right Atrium: Right atrial size was normal in size. Pericardium: There is no evidence of pericardial effusion. Mitral Valve: The mitral valve is normal in structure. Mild to moderate mitral valve regurgitation. No evidence of mitral valve stenosis. Tricuspid Valve: The tricuspid valve is normal in structure. Tricuspid valve regurgitation is not demonstrated. No evidence of tricuspid stenosis. Aortic Valve: The aortic valve is normal in structure. Aortic valve regurgitation is mild. Aortic regurgitation PHT measures 549 msec. No aortic stenosis is  present. Aortic valve mean gradient measures 3.0 mmHg. Aortic valve peak gradient measures 5.7 mmHg. Aortic valve area, by VTI measures 2.49 cm. Pulmonic Valve: The pulmonic valve was normal in structure. Pulmonic valve regurgitation is mild. No evidence of pulmonic stenosis. Aorta: The aortic root is normal in size and structure. There is borderline dilatation of the aortic root, measuring 37 mm. There is mild dilatation of the ascending aorta, measuring 42 mm. Venous: The inferior vena cava is normal in size with  greater than 50% respiratory variability, suggesting right atrial pressure of 3 mmHg. IAS/Shunts: No atrial level shunt detected by color flow Doppler.  LEFT VENTRICLE PLAX 2D LVIDd:         5.20 cm   Diastology LVIDs:         3.50 cm   LV e' medial:    5.77 cm/s LV PW:         1.30 cm   LV E/e' medial:  9.4 LV IVS:        1.30 cm   LV e' lateral:   4.46 cm/s LVOT diam:     2.10 cm   LV E/e' lateral: 12.2 LV SV:         63 LV SV Index:   29 LVOT Area:     3.46 cm  RIGHT VENTRICLE RV Basal diam:  3.60 cm RV S prime:     9.79 cm/s TAPSE (M-mode): 2.4 cm LEFT ATRIUM             Index        RIGHT ATRIUM           Index LA diam:        5.20 cm 2.42 cm/m   RA Area:     18.30 cm LA Vol (A2C):   58.4 ml 27.14 ml/m  RA Volume:   51.10 ml  23.75 ml/m LA Vol (A4C):   50.5 ml 23.47 ml/m LA Biplane Vol: 55.9 ml 25.98 ml/m  AORTIC VALVE                    PULMONIC VALVE AV Area (Vmax):    2.60 cm     PV Vmax:       0.79 m/s AV Area (Vmean):   2.41 cm     PV Peak grad:  2.5 mmHg AV Area (VTI):     2.49 cm AV Vmax:           119.00 cm/s AV Vmean:          82.500 cm/s AV VTI:            0.255 m AV Peak Grad:      5.7 mmHg AV Mean Grad:      3.0 mmHg LVOT Vmax:         89.40 cm/s LVOT Vmean:        57.400 cm/s LVOT VTI:          0.183 m LVOT/AV VTI ratio: 0.72 AI PHT:            549 msec  AORTA Ao Root diam: 3.70 cm Ao Asc diam:  4.20 cm MITRAL VALVE MV Area (PHT): 3.72 cm    SHUNTS MV Decel Time: 204 msec     Systemic VTI:  0.18 m MV E velocity: 54.40 cm/s  Systemic Diam: 2.10 cm MV A velocity: 69.40 cm/s MV E/A ratio:  0.78 [name] MD Electronically signed by [name] MD Signature Date/Time: 04/20/2022/4:57:29 PM    Final     Microbiology: Results for orders placed or performed during the hospital encounter of 04/30/22  Respiratory (~20 pathogens) panel by PCR     Status: None   Collection Time: 04/30/22  5:37 AM   Specimen: Tracheal Aspirate; Respiratory  Result Value Ref Range Status   Adenovirus NOT DETECTED NOT DETECTED Final   Coronavirus 229E NOT DETECTED NOT DETECTED Final    Comment: (NOTE) The Coronavirus on the Respiratory Panel, DOES NOT test  for the novel  Coronavirus (2019 nCoV)    Coronavirus HKU1 NOT DETECTED NOT DETECTED Final   Coronavirus NL63 NOT DETECTED NOT DETECTED Final   Coronavirus OC43 NOT DETECTED NOT DETECTED Final   Metapneumovirus NOT DETECTED NOT DETECTED Final   Rhinovirus / Enterovirus NOT DETECTED NOT DETECTED Final   Influenza A NOT DETECTED NOT DETECTED Final   Influenza B NOT DETECTED NOT DETECTED Final   Parainfluenza Virus 1 NOT DETECTED NOT DETECTED Final   Parainfluenza Virus 2 NOT DETECTED NOT DETECTED Final   Parainfluenza Virus 3 NOT DETECTED NOT DETECTED Final   Parainfluenza Virus 4 NOT DETECTED NOT DETECTED Final   Respiratory Syncytial Virus NOT DETECTED NOT DETECTED Final   Bordetella pertussis NOT DETECTED NOT DETECTED Final   Bordetella Parapertussis NOT DETECTED NOT DETECTED Final   Chlamydophila pneumoniae NOT DETECTED NOT DETECTED Final   Mycoplasma pneumoniae NOT DETECTED NOT DETECTED Final    Comment: Performed at [facility], [place] [address], [place], [place] [address]  MRSA Next Gen by PCR, Nasal     Status: None   Collection Time: 04/30/22  5:46 AM   Specimen: Nasal Mucosa; Nasal Swab  Result Value Ref Range Status   MRSA by PCR Next Gen NOT DETECTED NOT DETECTED Final    Comment: (NOTE) The GeneXpert MRSA  Assay (FDA approved for NASAL specimens only), is one component of a comprehensive MRSA colonization surveillance program. It is not intended to diagnose MRSA infection nor to guide or monitor treatment for MRSA infections. Test performance is not FDA approved in patients less than 38 years old. Performed at [facility], [address] [place], [place] [address]   Resp Panel by RT-PCR (Flu A&B, Covid)     Status: None   Collection Time: 04/30/22  8:04 AM  Result Value Ref Range Status   SARS Coronavirus 2 by RT PCR NEGATIVE NEGATIVE Final    Comment: (NOTE) SARS-CoV-2 target nucleic acids are NOT DETECTED.  The SARS-CoV-2 RNA is generally detectable in upper respiratory specimens during the acute phase of infection. The lowest concentration of SARS-CoV-2 viral copies this assay can detect is 138 copies/mL. A negative result does not preclude SARS-Cov-2 infection and should not be used as the sole basis for treatment or other patient management decisions. A negative result may occur with  improper specimen collection/handling, submission of specimen other than nasopharyngeal swab, presence of viral mutation(s) within the areas targeted by this assay, and inadequate number of viral copies(<138 copies/mL). A negative result must be combined with clinical observations, patient history, and epidemiological information. The expected result is Negative.  Fact Sheet for Patients:  EntrepreneurPulse.com.au  Fact Sheet for Healthcare Providers:  IncredibleEmployment.be  This test is no t yet approved or cleared by the [place] FDA and  has been authorized for detection and/or diagnosis of SARS-CoV-2 by FDA under an Emergency Use Authorization (EUA). This EUA will remain  in effect (meaning this test can be used) for the duration of the COVID-19 declaration under Section 564(b)(1) of the Act, 21 U.S.C.section 360bbb-3(b)(1), unless the  authorization is terminated  or revoked sooner.       Influenza A by PCR NEGATIVE NEGATIVE Final   Influenza B by PCR NEGATIVE NEGATIVE Final    Comment: (NOTE) The Xpert Xpress SARS-CoV-2/FLU/RSV plus assay is intended as an aid in the diagnosis of influenza from Nasopharyngeal swab specimens and should not be used as a sole basis for treatment. Nasal washings and aspirates are  unacceptable for Xpert Xpress SARS-CoV-2/FLU/RSV testing.  Fact Sheet for Patients: EntrepreneurPulse.com.au  Fact Sheet for Healthcare Providers: IncredibleEmployment.be  This test is not yet approved or cleared by the [place] FDA and has been authorized for detection and/or diagnosis of SARS-CoV-2 by FDA under an Emergency Use Authorization (EUA). This EUA will remain in effect (meaning this test can be used) for the duration of the COVID-19 declaration under Section 564(b)(1) of the Act, 21 U.S.C. section 360bbb-3(b)(1), unless the authorization is terminated or revoked.  Performed at [facility], [address] [place], [place] [address]   Culture, Respiratory w Gram Stain     Status: None   Collection Time: 04/30/22  8:04 AM   Specimen: Tracheal Aspirate; Respiratory  Result Value Ref Range Status   Specimen Description   Final    TRACHEAL ASPIRATE Performed at [facility], [address] [place], [place] [address]    Special Requests   Final    Normal Performed at [facility], [place], [place] [address]    Gram Stain   Final    RARE WBC PRESENT, PREDOMINANTLY PMN RARE GRAM POSITIVE COCCI IN PAIRS    Culture   Final    FEW Normal respiratory flora-no Staph aureus or Pseudomonas seen Performed at [place] [address] [place], [place] [address]    Report Status 05/03/2022 FINAL  Final  Culture, BAL-quantitative w Gram Stain     Status: Abnormal   Collection Time: 05/03/22  5:47 PM    Specimen: Bronchoalveolar Lavage; Respiratory  Result Value Ref Range Status   Specimen Description   Final    BRONCHIAL ALVEOLAR LAVAGE Performed at [facility], [address] [place], [place] [address]    Special Requests   Final    Immunocompromised Performed at [facility], [address] [place], [place] [address]    Gram Stain   Final    ABUNDANT WBC PRESENT, PREDOMINANTLY PMN RARE BUDDING YEAST SEEN RARE GRAM NEGATIVE RODS    Culture (A)  Final    20,000 COLONIES/mL ESCHERICHIA COLI 2,000 COLONIES/mL KLEBSIELLA PNEUMONIAE NO STAPHYLOCOCCUS AUREUS ISOLATED No Pseudomonas species isolated Performed at [facility], [place] [address], [place], [place] [address]    Report Status 05/06/2022 FINAL  Final   Organism ID, Bacteria ESCHERICHIA COLI (A)  Final   Organism ID, Bacteria KLEBSIELLA PNEUMONIAE (A)  Final      Susceptibility   Escherichia coli - MIC*    AMPICILLIN >=32 RESISTANT Resistant     CEFAZOLIN 16 SENSITIVE Sensitive     CEFEPIME <=0.12 SENSITIVE Sensitive     CEFTAZIDIME <=1 SENSITIVE Sensitive     CEFTRIAXONE <=0.25 SENSITIVE Sensitive     CIPROFLOXACIN <=0.25 SENSITIVE Sensitive     GENTAMICIN <=1 SENSITIVE Sensitive     IMIPENEM <=0.25 SENSITIVE Sensitive     TRIMETH/SULFA <=20 SENSITIVE Sensitive     AMPICILLIN/SULBACTAM >=32 RESISTANT Resistant     PIP/TAZO <=4 SENSITIVE Sensitive     * 20,000 COLONIES/mL ESCHERICHIA COLI   Klebsiella pneumoniae - MIC*    AMPICILLIN RESISTANT Resistant     CEFAZOLIN <=4 SENSITIVE Sensitive     CEFEPIME <=0.12 SENSITIVE Sensitive     CEFTAZIDIME <=1 SENSITIVE Sensitive     CEFTRIAXONE <=0.25 SENSITIVE Sensitive     CIPROFLOXACIN <=0.25 SENSITIVE Sensitive     GENTAMICIN <=1 SENSITIVE Sensitive     IMIPENEM <=0.25 SENSITIVE Sensitive     TRIMETH/SULFA <=20  SENSITIVE Sensitive     AMPICILLIN/SULBACTAM 4 SENSITIVE Sensitive     PIP/TAZO <=4 SENSITIVE Sensitive     * 2,000 COLONIES/mL  KLEBSIELLA PNEUMONIAE  Urine Culture     Status: None   Collection Time: 05/14/22  2:01 PM   Specimen: Urine, Random  Result Value Ref Range Status   Specimen Description   Final    URINE, RANDOM Performed at [facility], [address] [place], [place] [address]    Special Requests   Final    NONE Performed at [facility], [address] [place], [place] [address]    Culture   Final    NO GROWTH Performed at [facility], [place] [address], [place], [place] [address]    Report Status 05/15/2022 FINAL  Final    Labs: CBC: Recent Labs  Lab 05/12/22 0746 05/13/22 0639 05/14/22 0559 05/15/22 0529 05/17/22 0532  WBC 9.0 11.5* 10.8* 9.3 7.6  HGB 15.1 13.7 13.6 13.6 12.5*  HCT 51.2 45.1 45.3 45.0 42.8  MCV 98.5 97.0 96.8 98.3 99.5  PLT 143* 140* 178 157 832   Basic Metabolic Panel: Recent Labs  Lab 05/13/22 0706 05/14/22 0559 05/15/22 0529 05/16/22 0826 05/17/22 0532  NA 138 135 140 140 140  K 3.5 3.4* 4.2 4.3 4.0  CL 96* 96* 99 96* 95*  CO2 30 34* 35* 39* 39*  GLUCOSE 114* 156* 113* 108* 126*  BUN 18 25* 25* 30* 31*  CREATININE 0.87 0.87 0.88 0.85 0.88  CALCIUM 7.8* 7.8* 7.6* 8.4* 8.1*   Liver Function Tests: No results for input(s): "AST", "ALT", "ALKPHOS", "BILITOT", "PROT", "ALBUMIN" in the last 168 hours. CBG: Recent Labs  Lab 05/16/22 2244 05/17/22 0756 05/17/22 1136 05/17/22 1639 05/17/22 2030  GLUCAP 171* 114* 181* 130* 177*    Discharge time spent: greater than 30 minutes.  Signed: Max Sane, MD [facility] 05/18/2022

## 2022-05-18 NOTE — Telephone Encounter (Signed)
-----   Message from Gerrie Nordmann, NP sent at 05/18/2022 12:36 PM EDT ----- Regarding: follow up Patient will need a hospital follow up with Dr Rockey Situ in 3-4 weeks Please and thank you

## 2022-05-18 NOTE — Progress Notes (Signed)
[facility]) Hospital Liaison note:  Notified via Van from Dr. Max Sane of request for [facility] services. Will continue to follow for disposition.  Please call with any outpatient palliative questions or concerns.  Thank you for the opportunity to participate in this patient's care.  Thank you, Lorelee Market, LPN [facility] Liaison [phone]

## 2022-05-21 NOTE — Progress Notes (Unsigned)
Cardiology Office Note  Date:  05/22/2022   ID:  Joseph [facility], DOB 07-27-1946, MRN [id number]  PCP:  Joseph Sons, MD   Chief Complaint  Patient presents with   [facility] follow up     Patient c/o bilateral LE edema, shortness of breath and chest discomfort/pain. Medications reviewed by the patient verbally.     HPI:  Joseph [facility]donald is a 76 y.o. male with history of  sustained VT,  myasthenia gravis with myasthenia crisis requiring plasma exchange at [facility] in 12/2018,  HFpEF,  HTN,  smoking, COPD, on oxygen HLD  HCM who presents for follow up of HFpEF, atrial fibrillation on Eliquis, cardiomyopathy, acute on chronic respiratory failure  Last seen by myself in the clinic April 03, 2022 Since then he has had a long hospitalization cardiac arrest secondary to respiratory failure pneumonia, cardiomyopathy, acute on chronic systolic CHF Ejection fraction estimated 35 to 40% on echo Treated with aggressive diuresis  Cardiac catheterization May 16, 2022 Moderate LAD disease, 60% lesion no intervention performed Moderate pulmonary hypertension  Profound weakness during his hospitalization concerning for myasthenia gravis flare, treated with IVIG  Was discharged to [facility]  On oxygen today, feels better on 2 L, higher flow will burn his nose "[facility] is burning".  He feels that it is from sitting in the bed too long Doing PT  EKG personally reviewed by myself on todays visit Normal sinus rhythm rate 97 bpm poor R wave progression to the anterior precordial leads, left axis deviation  Last medical history reviewed [facility]  12/2018 for marked weakness consistent with myasthenia crisis. He underwent plasma exchange and was noted to have episodes of NSVT.     PMH:   has a past medical history of Arthritis, COPD (chronic obstructive pulmonary disease) ([facility]), Diabetes mellitus without complication ([facility]), Dyspnea, Hyperlipidemia, Hypertension, Myasthenia gravis ([facility]), Oxygen  deficit, and Ventricular tachycardia ([facility]).  PSH:    Past Surgical History:  Procedure Laterality Date   carpel tunnel sx     CATARACT EXTRACTION     right eye   CATARACT EXTRACTION W/PHACO Left 12/01/2020   Procedure: CATARACT EXTRACTION PHACO AND INTRAOCULAR LENS PLACEMENT (IOC) LEFT 10.83 01:14.1 14.6%;  Surgeon: Leandrew Koyanagi, MD;  Location: [facility];  Service: Ophthalmology;  Laterality: Left;   INTRAVASCULAR PRESSURE WIRE/FFR STUDY N/A 05/16/2022   Procedure: INTRAVASCULAR PRESSURE WIRE/FFR STUDY;  Surgeon: Nelva Bush, MD;  Location: [facility];  Service: Cardiovascular;  Laterality: N/A;   RETINAL DETACHMENT SURGERY     RIGHT/LEFT HEART CATH AND CORONARY ANGIOGRAPHY N/A 05/16/2022   Procedure: RIGHT/LEFT HEART CATH AND CORONARY ANGIOGRAPHY;  Surgeon: Nelva Bush, MD;  Location: [facility];  Service: Cardiovascular;  Laterality: N/A;   SKIN GRAFT     Behind left knee   TONSILLECTOMY AND ADENOIDECTOMY      Current Outpatient Medications  Medication Sig Dispense Refill   albuterol (PROVENTIL HFA;VENTOLIN HFA) 108 (90 Base) MCG/ACT inhaler Inhale into the lungs every 6 (six) hours as needed for wheezing or shortness of breath.     apixaban (ELIQUIS) 5 MG TABS tablet Take 5 mg by mouth 2 (two) times daily.     budesonide-formoterol (SYMBICORT) 160-4.5 MCG/ACT inhaler Inhale 2 puffs into the lungs 2 (two) times daily. 1 Inhaler 0   cholecalciferol (VITAMIN D) 25 MCG (1000 UNIT) tablet Take 2,000 Units by mouth daily.     digoxin (LANOXIN) 0.125 MG tablet Take 1 tablet (0.125 mg total) by mouth daily.  empagliflozin (JARDIANCE) 25 MG TABS tablet Take 25 mg by mouth daily.     furosemide (LASIX) 40 MG tablet Take 1 tablet (40 mg total) by mouth 2 (two) times daily. 30 tablet    losartan (COZAAR) 25 MG tablet Take 1 tablet (25 mg total) by mouth daily.     metoprolol succinate (TOPROL-XL) 50 MG 24 hr tablet Take 1 tablet (50 mg total) by  mouth daily. Take with or immediately following a meal. 30 tablet 0   OLANZapine zydis (ZYPREXA) 5 MG disintegrating tablet Take 1 tablet (5 mg total) by mouth 2 (two) times daily.     omeprazole (PRILOSEC) 40 MG capsule Take 40 mg by mouth 2 (two) times daily.      pramipexole (MIRAPEX) 0.5 MG tablet Take 1 tablet (0.5 mg total) by mouth at bedtime. 30 tablet 12   pravastatin (PRAVACHOL) 20 MG tablet Take 20 mg by mouth daily.     predniSONE (DELTASONE) 20 MG tablet Take 15 mg by mouth daily with breakfast.     pyridostigmine (MESTINON) 60 MG tablet Take 30 mg by mouth 3 (three) times daily.     terbinafine (LAMISIL) 250 MG tablet Take 250 mg by mouth daily.     tiotropium (SPIRIVA) 18 MCG inhalation capsule Place 18 mcg into inhaler and inhale daily.     vitamin B-12 (CYANOCOBALAMIN) 1000 MCG tablet Take 1,000 mcg by mouth daily.     No current facility-administered medications for this visit.    Allergies:   Patient has no known allergies.   Social History:  The patient  reports that he quit smoking about 4 years ago. His smoking use included pipe and cigarettes. He has a 20.00 pack-year smoking history. He quit smokeless tobacco use about 4 years ago. He reports current alcohol use. He reports that he does not use drugs.   Family History:   family history includes Arthritis in his father, mother, and another family member; Heart attack in his father; Heart disease (age of onset: 5) in his father; Hypertension in his mother; Kidney disease in his mother; Stroke in his father; Thyroid disease in his mother.    Review of Systems: Review of Systems  Constitutional: Negative.   HENT: Negative.    Respiratory:  Positive for shortness of breath.   Cardiovascular: Negative.   Gastrointestinal: Negative.   Musculoskeletal: Negative.   Neurological: Negative.   Psychiatric/Behavioral: Negative.    All other systems reviewed and are negative.   PHYSICAL EXAM: VS:  BP 140/64 (BP Location:  Left Arm, Patient Position: Sitting, Cuff Size: Normal)   Pulse 97   Ht '5\' 7"'$  (1.702 m)   Wt 232 lb (105.2 kg)   BMI 36.34 kg/m  , BMI Body mass index is 36.34 kg/m. Constitutional:  oriented to person, place, and time. No distress.  HENT:  Head: Grossly normal Eyes:  no discharge. No scleral icterus.  Neck: No JVD, no carotid bruits  Cardiovascular: Regular rate and rhythm, no murmurs appreciated Pulmonary/Chest: Clear to auscultation bilaterally, no wheezes or rails Abdominal: Soft.  no distension.  no tenderness.  Musculoskeletal: Normal range of motion Neurological:  normal muscle tone. Coordination normal. No atrophy Skin: Skin warm and dry Psychiatric: normal affect, [facility]   Recent Labs: 04/30/2022: B Natriuretic Peptide 971.6; TSH 0.441 05/03/2022: ALT 1,002 05/08/2022: Magnesium 2.1 05/17/2022: BUN 31; Creatinine, Ser 0.88; Hemoglobin 12.5; Platelets 212; Potassium 4.0; Sodium 140    Lipid Panel [facility] Results  Component Value Date   CHOL  141 04/03/2022   HDL 62 04/03/2022   LDLCALC 62 04/03/2022   TRIG 92 04/03/2022      Wt Readings from Last 3 Encounters:  05/22/22 232 lb (105.2 kg)  05/18/22 228 lb 14.8 oz (103.8 kg)  04/03/22 231 lb 6 oz (105 kg)      ASSESSMENT AND PLAN:  Problem List Items Addressed This Visit       Cardiology Problems   Cardiomyopathy [facility])   Chronic heart failure with preserved ejection fraction (HFpEF) ([place]) - Primary   Relevant Orders   EKG 12-Lead   Ventricular tachycardia (HCC)   Relevant Orders   EKG 12-Lead   Essential hypertension   Relevant Orders   EKG 12-Lead     Other   Morbid obesity (HCC) (Chronic)   COPD (chronic obstructive pulmonary disease) (HCC)   Relevant Orders   EKG 12-Lead   Other Visit Diagnoses     Hyperlipidemia, unspecified hyperlipidemia type       Hypertrophic cardiomyopathy (HCC)       Sinus bradycardia       Chronic heart failure with preserved ejection fraction (HCC)       Dyspnea on  exertion         Chronic respiratory distress Long history of smoking, underlying COPD Muscle weakness from myasthenia gravis Recent acute on chronic diastolic and systolic CHF in the setting of COPD exacerbation requiring intubation On [facility] course now at [facility] doing PT -Pulmonary hypertension on catheterization Continue Lasix 40 twice daily BMP in 2 weeks time  Shortness of breath/diastolic and systolic CHF Exacerbated by morbid obesity, COPD, myasthenia gravis, cardiomyopathy Recommended he stop losartan, we will change to Entresto 24/26 mg twice daily  Morbid obesity We have encouraged continued exercise, careful diet management in an effort to lose weight.  Myasthenia gravis Previously treated with prednisone Recent treatment IVIG in the [facility]  Sustained VT Followed by Dr. Caryl Comes  Paroxysmal atrial fibrillation On Eliquis Maintaining normal sinus rhythm Continue metoprolol  Leg pain Managed by Dr. Caryn Section Recommend he try to spend less time in bed   Total encounter time more than 40 minutes  Greater than 50% was spent in counseling and coordination of care with the patient  Signed, Esmond Plants, M.D., Ph.D. [place], [place]

## 2022-05-22 ENCOUNTER — Encounter: Payer: Self-pay | Admitting: Cardiovascular Disease

## 2022-05-22 ENCOUNTER — Inpatient Hospital Stay: Payer: PPO | Admitting: Family Medicine

## 2022-05-22 ENCOUNTER — Ambulatory Visit (INDEPENDENT_AMBULATORY_CARE_PROVIDER_SITE_OTHER): Payer: PPO | Admitting: Cardiovascular Disease

## 2022-05-22 VITALS — BP 140/64 | HR 97 | Ht 67.0 in | Wt 232.0 lb

## 2022-05-22 DIAGNOSIS — I255 Ischemic cardiomyopathy: Secondary | ICD-10-CM | POA: Diagnosis not present

## 2022-05-22 DIAGNOSIS — I5032 Chronic diastolic (congestive) heart failure: Secondary | ICD-10-CM

## 2022-05-22 DIAGNOSIS — I480 Paroxysmal atrial fibrillation: Secondary | ICD-10-CM | POA: Diagnosis not present

## 2022-05-22 DIAGNOSIS — R001 Bradycardia, unspecified: Secondary | ICD-10-CM | POA: Diagnosis not present

## 2022-05-22 DIAGNOSIS — E785 Hyperlipidemia, unspecified: Secondary | ICD-10-CM

## 2022-05-22 DIAGNOSIS — M6281 Muscle weakness (generalized): Secondary | ICD-10-CM | POA: Diagnosis not present

## 2022-05-22 DIAGNOSIS — I422 Other hypertrophic cardiomyopathy: Secondary | ICD-10-CM

## 2022-05-22 DIAGNOSIS — I472 Ventricular tachycardia, unspecified: Secondary | ICD-10-CM | POA: Diagnosis not present

## 2022-05-22 DIAGNOSIS — I420 Dilated cardiomyopathy: Secondary | ICD-10-CM

## 2022-05-22 DIAGNOSIS — K219 Gastro-esophageal reflux disease without esophagitis: Secondary | ICD-10-CM | POA: Diagnosis not present

## 2022-05-22 DIAGNOSIS — J449 Chronic obstructive pulmonary disease, unspecified: Secondary | ICD-10-CM | POA: Diagnosis not present

## 2022-05-22 DIAGNOSIS — G7000 Myasthenia gravis without (acute) exacerbation: Secondary | ICD-10-CM | POA: Diagnosis not present

## 2022-05-22 DIAGNOSIS — I10 Essential (primary) hypertension: Secondary | ICD-10-CM | POA: Diagnosis not present

## 2022-05-22 DIAGNOSIS — R0609 Other forms of dyspnea: Secondary | ICD-10-CM

## 2022-05-22 DIAGNOSIS — J441 Chronic obstructive pulmonary disease with (acute) exacerbation: Secondary | ICD-10-CM | POA: Diagnosis not present

## 2022-05-22 DIAGNOSIS — E782 Mixed hyperlipidemia: Secondary | ICD-10-CM | POA: Diagnosis not present

## 2022-05-22 DIAGNOSIS — I161 Hypertensive emergency: Secondary | ICD-10-CM | POA: Diagnosis not present

## 2022-05-22 DIAGNOSIS — J9601 Acute respiratory failure with hypoxia: Secondary | ICD-10-CM | POA: Diagnosis not present

## 2022-05-22 DIAGNOSIS — I5023 Acute on chronic systolic (congestive) heart failure: Secondary | ICD-10-CM | POA: Diagnosis not present

## 2022-05-22 MED ORDER — ENTRESTO 24-26 MG PO TABS: 1.0000 | ORAL_TABLET | Freq: Two times a day (BID) | ORAL | 3 refills | Status: DC

## 2022-05-22 NOTE — Progress Notes (Deleted)
      Established patient visit   Patient: [facility]   DOB: [birth year]   76 y.o. Male  MRN: [id number] Visit Date: 05/22/2022  Today's healthcare provider: Lelon Huh, MD   No chief complaint on file.  Subjective    HPI  Follow up Hospitalization  Patient was admitted to [facility] on 04/30/2022 and discharged on 05/18/2022. He was treated for cardiac arrest due to respiratory disorder, acute respiratory failure and pulmonary edema. Telephone follow up was not done. Patient was seen by Cardiology today. He reports {excellent/good/fair:19992} compliance with treatment. He reports this condition is {resolved/improved/worsened:23923}.  ----------------------------------------------------------------------------------------- -   Medications: Outpatient Medications Prior to Visit  Medication Sig   albuterol (PROVENTIL HFA;VENTOLIN HFA) 108 (90 Base) MCG/ACT inhaler Inhale into the lungs every 6 (six) hours as needed for wheezing or shortness of breath.   apixaban (ELIQUIS) 5 MG TABS tablet Take 5 mg by mouth 2 (two) times daily.   budesonide-formoterol (SYMBICORT) 160-4.5 MCG/ACT inhaler Inhale 2 puffs into the lungs 2 (two) times daily.   cholecalciferol (VITAMIN D) 25 MCG (1000 UNIT) tablet Take 2,000 Units by mouth daily.   digoxin (LANOXIN) 0.125 MG tablet Take 1 tablet (0.125 mg total) by mouth daily.   empagliflozin (JARDIANCE) 25 MG TABS tablet Take 25 mg by mouth daily.   furosemide (LASIX) 40 MG tablet Take 1 tablet (40 mg total) by mouth 2 (two) times daily.   metoprolol succinate (TOPROL-XL) 50 MG 24 hr tablet Take 1 tablet (50 mg total) by mouth daily. Take with or immediately following a meal.   OLANZapine zydis (ZYPREXA) 5 MG disintegrating tablet Take 1 tablet (5 mg total) by mouth 2 (two) times daily.   omeprazole (PRILOSEC) 40 MG capsule Take 40 mg by mouth 2 (two) times daily.    pramipexole (MIRAPEX) 0.5 MG tablet Take 1 tablet (0.5 mg total) by mouth at  bedtime.   pravastatin (PRAVACHOL) 20 MG tablet Take 20 mg by mouth daily.   predniSONE (DELTASONE) 20 MG tablet Take 15 mg by mouth daily with breakfast.   pyridostigmine (MESTINON) 60 MG tablet Take 30 mg by mouth 3 (three) times daily.   sacubitril-valsartan (ENTRESTO) 24-26 MG Take 1 tablet by mouth 2 (two) times daily.   terbinafine (LAMISIL) 250 MG tablet Take 250 mg by mouth daily.   tiotropium (SPIRIVA) 18 MCG inhalation capsule Place 18 mcg into inhaler and inhale daily.   vitamin B-12 (CYANOCOBALAMIN) 1000 MCG tablet Take 1,000 mcg by mouth daily.   No facility-administered medications prior to visit.    Review of Systems  {Labs  Heme  Chem  Endocrine  Serology  Results Review (optional):23779}   Objective    There were no vitals taken for this visit. {Show previous vital signs (optional):23777}  Physical Exam  ***  No results found for any visits on 05/22/22.  Assessment & Plan     ***  No follow-ups on file.      {provider attestation***:1}   Lelon Huh, MD  [facility] [phone] (phone) [phone] (fax)  [place]

## 2022-05-22 NOTE — Patient Instructions (Addendum)
Medication Instructions:  Please stop the losartan Start entresto 24/26 mg twice a day  If you need a refill on your cardiac medications before your next appointment, please call your pharmacy.   Lab work:  Your physician recommends that you return for lab work (BMP) in: 2 weeks  - Please go to the [facility]. You will check in at the front desk to the right as you walk into the atrium. Valet Parking is offered if needed. - No appointment needed. You may go any day between 7 am and 6 pm.    Testing/Procedures: No new testing needed  Follow-Up: At [facility], you and your health needs are our priority.  As part of our continuing mission to provide you with exceptional heart care, we have created designated Provider Care Teams.  These Care Teams include your primary Cardiologist (physician) and Advanced Practice Providers (APPs -  Physician Assistants and Nurse Practitioners) who all work together to provide you with the care you need, when you need it.  You will need a follow up appointment in 3 months  Providers on your designated Care Team:   Murray Hodgkins, NP Christell Faith, PA-C Cadence Kathlen Mody, [place]  COVID-19 Vaccine Information can be found at: [email or web address].uk For questions related to vaccine distribution or appointments, please email [email or web address] or call [phone].

## 2022-05-23 DIAGNOSIS — I480 Paroxysmal atrial fibrillation: Secondary | ICD-10-CM | POA: Diagnosis not present

## 2022-05-23 DIAGNOSIS — E119 Type 2 diabetes mellitus without complications: Secondary | ICD-10-CM | POA: Diagnosis not present

## 2022-05-23 DIAGNOSIS — G7000 Myasthenia gravis without (acute) exacerbation: Secondary | ICD-10-CM | POA: Diagnosis not present

## 2022-05-23 DIAGNOSIS — K219 Gastro-esophageal reflux disease without esophagitis: Secondary | ICD-10-CM | POA: Diagnosis not present

## 2022-05-23 DIAGNOSIS — I5023 Acute on chronic systolic (congestive) heart failure: Secondary | ICD-10-CM | POA: Diagnosis not present

## 2022-05-23 DIAGNOSIS — E782 Mixed hyperlipidemia: Secondary | ICD-10-CM | POA: Diagnosis not present

## 2022-05-23 DIAGNOSIS — J441 Chronic obstructive pulmonary disease with (acute) exacerbation: Secondary | ICD-10-CM | POA: Diagnosis not present

## 2022-05-23 DIAGNOSIS — J9601 Acute respiratory failure with hypoxia: Secondary | ICD-10-CM | POA: Diagnosis not present

## 2022-05-23 DIAGNOSIS — I462 Cardiac arrest due to underlying cardiac condition: Secondary | ICD-10-CM | POA: Diagnosis not present

## 2022-05-23 DIAGNOSIS — M6281 Muscle weakness (generalized): Secondary | ICD-10-CM | POA: Diagnosis not present

## 2022-05-23 DIAGNOSIS — I161 Hypertensive emergency: Secondary | ICD-10-CM | POA: Diagnosis not present

## 2022-05-23 DIAGNOSIS — I255 Ischemic cardiomyopathy: Secondary | ICD-10-CM | POA: Diagnosis not present

## 2022-05-25 ENCOUNTER — Encounter: Payer: Self-pay | Admitting: Cardiovascular Disease

## 2022-05-26 DIAGNOSIS — I5023 Acute on chronic systolic (congestive) heart failure: Secondary | ICD-10-CM | POA: Diagnosis not present

## 2022-05-26 DIAGNOSIS — J9601 Acute respiratory failure with hypoxia: Secondary | ICD-10-CM | POA: Diagnosis not present

## 2022-05-26 DIAGNOSIS — M6281 Muscle weakness (generalized): Secondary | ICD-10-CM | POA: Diagnosis not present

## 2022-05-26 DIAGNOSIS — I480 Paroxysmal atrial fibrillation: Secondary | ICD-10-CM | POA: Diagnosis not present

## 2022-05-26 DIAGNOSIS — I255 Ischemic cardiomyopathy: Secondary | ICD-10-CM | POA: Diagnosis not present

## 2022-05-26 DIAGNOSIS — J441 Chronic obstructive pulmonary disease with (acute) exacerbation: Secondary | ICD-10-CM | POA: Diagnosis not present

## 2022-05-26 DIAGNOSIS — G7000 Myasthenia gravis without (acute) exacerbation: Secondary | ICD-10-CM | POA: Diagnosis not present

## 2022-05-26 DIAGNOSIS — I161 Hypertensive emergency: Secondary | ICD-10-CM | POA: Diagnosis not present

## 2022-05-26 DIAGNOSIS — G894 Chronic pain syndrome: Secondary | ICD-10-CM | POA: Diagnosis not present

## 2022-05-26 DIAGNOSIS — E782 Mixed hyperlipidemia: Secondary | ICD-10-CM | POA: Diagnosis not present

## 2022-05-26 DIAGNOSIS — K219 Gastro-esophageal reflux disease without esophagitis: Secondary | ICD-10-CM | POA: Diagnosis not present

## 2022-05-29 DIAGNOSIS — J441 Chronic obstructive pulmonary disease with (acute) exacerbation: Secondary | ICD-10-CM | POA: Diagnosis not present

## 2022-05-29 DIAGNOSIS — I5023 Acute on chronic systolic (congestive) heart failure: Secondary | ICD-10-CM | POA: Diagnosis not present

## 2022-05-29 DIAGNOSIS — J9601 Acute respiratory failure with hypoxia: Secondary | ICD-10-CM | POA: Diagnosis not present

## 2022-05-29 DIAGNOSIS — E782 Mixed hyperlipidemia: Secondary | ICD-10-CM | POA: Diagnosis not present

## 2022-05-29 DIAGNOSIS — I480 Paroxysmal atrial fibrillation: Secondary | ICD-10-CM | POA: Diagnosis not present

## 2022-05-29 DIAGNOSIS — G7000 Myasthenia gravis without (acute) exacerbation: Secondary | ICD-10-CM | POA: Diagnosis not present

## 2022-05-29 DIAGNOSIS — I161 Hypertensive emergency: Secondary | ICD-10-CM | POA: Diagnosis not present

## 2022-05-29 DIAGNOSIS — G894 Chronic pain syndrome: Secondary | ICD-10-CM | POA: Diagnosis not present

## 2022-05-29 DIAGNOSIS — K219 Gastro-esophageal reflux disease without esophagitis: Secondary | ICD-10-CM | POA: Diagnosis not present

## 2022-05-29 DIAGNOSIS — M6281 Muscle weakness (generalized): Secondary | ICD-10-CM | POA: Diagnosis not present

## 2022-05-29 DIAGNOSIS — I255 Ischemic cardiomyopathy: Secondary | ICD-10-CM | POA: Diagnosis not present

## 2022-06-02 DIAGNOSIS — I255 Ischemic cardiomyopathy: Secondary | ICD-10-CM | POA: Diagnosis not present

## 2022-06-02 DIAGNOSIS — I5023 Acute on chronic systolic (congestive) heart failure: Secondary | ICD-10-CM | POA: Diagnosis not present

## 2022-06-02 DIAGNOSIS — K219 Gastro-esophageal reflux disease without esophagitis: Secondary | ICD-10-CM | POA: Diagnosis not present

## 2022-06-02 DIAGNOSIS — E782 Mixed hyperlipidemia: Secondary | ICD-10-CM | POA: Diagnosis not present

## 2022-06-02 DIAGNOSIS — G894 Chronic pain syndrome: Secondary | ICD-10-CM | POA: Diagnosis not present

## 2022-06-02 DIAGNOSIS — M6281 Muscle weakness (generalized): Secondary | ICD-10-CM | POA: Diagnosis not present

## 2022-06-02 DIAGNOSIS — J441 Chronic obstructive pulmonary disease with (acute) exacerbation: Secondary | ICD-10-CM | POA: Diagnosis not present

## 2022-06-02 DIAGNOSIS — J9601 Acute respiratory failure with hypoxia: Secondary | ICD-10-CM | POA: Diagnosis not present

## 2022-06-02 DIAGNOSIS — I480 Paroxysmal atrial fibrillation: Secondary | ICD-10-CM | POA: Diagnosis not present

## 2022-06-02 DIAGNOSIS — I161 Hypertensive emergency: Secondary | ICD-10-CM | POA: Diagnosis not present

## 2022-06-02 DIAGNOSIS — G7000 Myasthenia gravis without (acute) exacerbation: Secondary | ICD-10-CM | POA: Diagnosis not present

## 2022-06-05 ENCOUNTER — Ambulatory Visit: Payer: Self-pay

## 2022-06-05 DIAGNOSIS — I5032 Chronic diastolic (congestive) heart failure: Secondary | ICD-10-CM

## 2022-06-06 DIAGNOSIS — Z9981 Dependence on supplemental oxygen: Secondary | ICD-10-CM | POA: Diagnosis not present

## 2022-06-06 DIAGNOSIS — M25562 Pain in left knee: Secondary | ICD-10-CM | POA: Diagnosis not present

## 2022-06-06 DIAGNOSIS — M25561 Pain in right knee: Secondary | ICD-10-CM | POA: Diagnosis not present

## 2022-06-06 DIAGNOSIS — J449 Chronic obstructive pulmonary disease, unspecified: Secondary | ICD-10-CM | POA: Diagnosis not present

## 2022-06-06 DIAGNOSIS — G8929 Other chronic pain: Secondary | ICD-10-CM | POA: Diagnosis not present

## 2022-06-08 ENCOUNTER — Ambulatory Visit (INDEPENDENT_AMBULATORY_CARE_PROVIDER_SITE_OTHER): Payer: PPO | Admitting: Physician Assistant

## 2022-06-08 ENCOUNTER — Encounter: Payer: Self-pay | Admitting: Emergency Medicine

## 2022-06-08 ENCOUNTER — Inpatient Hospital Stay
Admission: EM | Admit: 2022-06-08 | Discharge: 2022-06-13 | DRG: 056 | Disposition: A | Discharge status: Home Health | Payer: PPO | Source: Ambulatory Visit | Attending: Internal Medicine | Admitting: Internal Medicine

## 2022-06-08 ENCOUNTER — Encounter: Payer: Self-pay | Admitting: Physician Assistant

## 2022-06-08 ENCOUNTER — Other Ambulatory Visit: Payer: Self-pay

## 2022-06-08 ENCOUNTER — Emergency Department: Payer: PPO

## 2022-06-08 VITALS — BP 94/53 | Ht 66.0 in

## 2022-06-08 DIAGNOSIS — L89312 Pressure ulcer of right buttock, stage 2: Secondary | ICD-10-CM | POA: Diagnosis not present

## 2022-06-08 DIAGNOSIS — Z20822 Contact with and (suspected) exposure to covid-19: Secondary | ICD-10-CM | POA: Diagnosis present

## 2022-06-08 DIAGNOSIS — Z87891 Personal history of nicotine dependence: Secondary | ICD-10-CM | POA: Diagnosis not present

## 2022-06-08 DIAGNOSIS — Z7901 Long term (current) use of anticoagulants: Secondary | ICD-10-CM

## 2022-06-08 DIAGNOSIS — Z8249 Family history of ischemic heart disease and other diseases of the circulatory system: Secondary | ICD-10-CM | POA: Diagnosis not present

## 2022-06-08 DIAGNOSIS — J9621 Acute and chronic respiratory failure with hypoxia: Secondary | ICD-10-CM | POA: Diagnosis present

## 2022-06-08 DIAGNOSIS — Z79899 Other long term (current) drug therapy: Secondary | ICD-10-CM

## 2022-06-08 DIAGNOSIS — G894 Chronic pain syndrome: Secondary | ICD-10-CM | POA: Diagnosis not present

## 2022-06-08 DIAGNOSIS — J9622 Acute and chronic respiratory failure with hypercapnia: Secondary | ICD-10-CM | POA: Diagnosis present

## 2022-06-08 DIAGNOSIS — E662 Morbid (severe) obesity with alveolar hypoventilation: Secondary | ICD-10-CM | POA: Diagnosis not present

## 2022-06-08 DIAGNOSIS — Z8349 Family history of other endocrine, nutritional and metabolic diseases: Secondary | ICD-10-CM

## 2022-06-08 DIAGNOSIS — Z9981 Dependence on supplemental oxygen: Secondary | ICD-10-CM | POA: Diagnosis not present

## 2022-06-08 DIAGNOSIS — I480 Paroxysmal atrial fibrillation: Secondary | ICD-10-CM | POA: Diagnosis not present

## 2022-06-08 DIAGNOSIS — D539 Nutritional anemia, unspecified: Secondary | ICD-10-CM | POA: Diagnosis not present

## 2022-06-08 DIAGNOSIS — Z888 Allergy status to other drugs, medicaments and biological substances status: Secondary | ICD-10-CM

## 2022-06-08 DIAGNOSIS — J9602 Acute respiratory failure with hypercapnia: Secondary | ICD-10-CM

## 2022-06-08 DIAGNOSIS — R0902 Hypoxemia: Secondary | ICD-10-CM | POA: Diagnosis not present

## 2022-06-08 DIAGNOSIS — Z7951 Long term (current) use of inhaled steroids: Secondary | ICD-10-CM

## 2022-06-08 DIAGNOSIS — G8929 Other chronic pain: Secondary | ICD-10-CM | POA: Diagnosis present

## 2022-06-08 DIAGNOSIS — J449 Chronic obstructive pulmonary disease, unspecified: Secondary | ICD-10-CM | POA: Diagnosis not present

## 2022-06-08 DIAGNOSIS — G7001 Myasthenia gravis with (acute) exacerbation: Principal | ICD-10-CM | POA: Diagnosis present

## 2022-06-08 DIAGNOSIS — I509 Heart failure, unspecified: Secondary | ICD-10-CM | POA: Insufficient documentation

## 2022-06-08 DIAGNOSIS — Z66 Do not resuscitate: Secondary | ICD-10-CM | POA: Diagnosis present

## 2022-06-08 DIAGNOSIS — I110 Hypertensive heart disease with heart failure: Secondary | ICD-10-CM | POA: Diagnosis present

## 2022-06-08 DIAGNOSIS — M1990 Unspecified osteoarthritis, unspecified site: Secondary | ICD-10-CM | POA: Diagnosis not present

## 2022-06-08 DIAGNOSIS — I429 Cardiomyopathy, unspecified: Secondary | ICD-10-CM | POA: Diagnosis present

## 2022-06-08 DIAGNOSIS — Z6836 Body mass index (BMI) 36.0-36.9, adult: Secondary | ICD-10-CM

## 2022-06-08 DIAGNOSIS — Z8261 Family history of arthritis: Secondary | ICD-10-CM

## 2022-06-08 DIAGNOSIS — R0602 Shortness of breath: Secondary | ICD-10-CM | POA: Diagnosis not present

## 2022-06-08 DIAGNOSIS — Z841 Family history of disorders of kidney and ureter: Secondary | ICD-10-CM

## 2022-06-08 DIAGNOSIS — J441 Chronic obstructive pulmonary disease with (acute) exacerbation: Secondary | ICD-10-CM | POA: Diagnosis not present

## 2022-06-08 DIAGNOSIS — E119 Type 2 diabetes mellitus without complications: Secondary | ICD-10-CM | POA: Diagnosis not present

## 2022-06-08 DIAGNOSIS — R079 Chest pain, unspecified: Secondary | ICD-10-CM | POA: Diagnosis not present

## 2022-06-08 DIAGNOSIS — Z7952 Long term (current) use of systemic steroids: Secondary | ICD-10-CM

## 2022-06-08 DIAGNOSIS — Z86711 Personal history of pulmonary embolism: Secondary | ICD-10-CM | POA: Diagnosis not present

## 2022-06-08 DIAGNOSIS — I5043 Acute on chronic combined systolic (congestive) and diastolic (congestive) heart failure: Secondary | ICD-10-CM | POA: Diagnosis present

## 2022-06-08 DIAGNOSIS — E785 Hyperlipidemia, unspecified: Secondary | ICD-10-CM | POA: Diagnosis not present

## 2022-06-08 DIAGNOSIS — J9601 Acute respiratory failure with hypoxia: Secondary | ICD-10-CM | POA: Diagnosis not present

## 2022-06-08 DIAGNOSIS — G7000 Myasthenia gravis without (acute) exacerbation: Secondary | ICD-10-CM | POA: Diagnosis present

## 2022-06-08 DIAGNOSIS — Z7984 Long term (current) use of oral hypoglycemic drugs: Secondary | ICD-10-CM

## 2022-06-08 DIAGNOSIS — I447 Left bundle-branch block, unspecified: Secondary | ICD-10-CM | POA: Diagnosis not present

## 2022-06-08 DIAGNOSIS — Z823 Family history of stroke: Secondary | ICD-10-CM

## 2022-06-08 DIAGNOSIS — Z8674 Personal history of sudden cardiac arrest: Secondary | ICD-10-CM | POA: Diagnosis not present

## 2022-06-08 LAB — URINALYSIS, ROUTINE W REFLEX MICROSCOPIC
Bilirubin Urine: NEGATIVE
Glucose, UA: >=500 mg/dL — AB
Hgb urine dipstick: NEGATIVE
Ketones, ur: NEGATIVE mg/dL
Leukocytes,Ua: NEGATIVE
Nitrite: NEGATIVE
Protein, ur: NEGATIVE mg/dL
Specific Gravity, Urine: 1.011 (ref 1.005–1.030)
pH: 5 (ref 5.0–8.0)

## 2022-06-08 LAB — CBC
HCT: 47.2 % (ref 39.0–52.0)
Hemoglobin: 13.6 g/dL (ref 13.0–17.0)
MCH: 29.6 pg (ref 26.0–34.0)
MCHC: 28.8 g/dL — ABNORMAL LOW (ref 30.0–36.0)
MCV: 102.8 fL — ABNORMAL HIGH (ref 80.0–100.0)
Platelets: 194 10*3/uL (ref 150–400)
RBC: 4.59 MIL/uL (ref 4.22–5.81)
RDW: 15.6 % — ABNORMAL HIGH (ref 11.5–15.5)
WBC: 13 10*3/uL — ABNORMAL HIGH (ref 4.0–10.5)
nRBC: 0.2 % (ref 0.0–0.2)

## 2022-06-08 LAB — BASIC METABOLIC PANEL
Anion gap: 10 (ref 5–15)
BUN: 20 mg/dL (ref 8–23)
CO2: 33 mmol/L — ABNORMAL HIGH (ref 22–32)
Calcium: 7.7 mg/dL — ABNORMAL LOW (ref 8.9–10.3)
Chloride: 98 mmol/L (ref 98–111)
Creatinine, Ser: 0.96 mg/dL (ref 0.61–1.24)
GFR, Estimated: >60 mL/min (ref >60)
Glucose, Bld: 169 mg/dL — ABNORMAL HIGH (ref 70–99)
Potassium: 4.8 mmol/L (ref 3.5–5.1)
Sodium: 141 mmol/L (ref 135–145)

## 2022-06-08 LAB — SARS CORONAVIRUS 2 BY RT PCR: SARS Coronavirus 2 by RT PCR: NEGATIVE

## 2022-06-08 LAB — PROCALCITONIN: Procalcitonin: <0.1 ng/mL

## 2022-06-08 LAB — HEPATIC FUNCTION PANEL
ALT: 17 U/L (ref 0–44)
AST: 18 U/L (ref 15–41)
Albumin: 3.5 g/dL (ref 3.5–5.0)
Alkaline Phosphatase: 118 U/L (ref 38–126)
Bilirubin, Direct: <0.1 mg/dL (ref 0.0–0.2)
Total Bilirubin: 0.5 mg/dL (ref 0.3–1.2)
Total Protein: 7.1 g/dL (ref 6.5–8.1)

## 2022-06-08 LAB — PROTIME-INR
INR: 1.1 (ref 0.8–1.2)
Prothrombin Time: 14.4 seconds (ref 11.4–15.2)

## 2022-06-08 LAB — LACTIC ACID, PLASMA
Lactic Acid, Venous: 1.3 mmol/L (ref 0.5–1.9)
Lactic Acid, Venous: 0.9 mmol/L (ref 0.5–1.9)

## 2022-06-08 LAB — APTT: aPTT: 30 seconds (ref 24–36)

## 2022-06-08 LAB — TROPONIN I (HIGH SENSITIVITY)
Troponin I (High Sensitivity): 19 ng/L — ABNORMAL HIGH (ref <18)
Troponin I (High Sensitivity): 16 ng/L (ref <18)

## 2022-06-08 LAB — BRAIN NATRIURETIC PEPTIDE: B Natriuretic Peptide: 714.7 pg/mL — ABNORMAL HIGH (ref 0.0–100.0)

## 2022-06-08 LAB — CBG MONITORING, ED: Glucose-Capillary: 198 mg/dL — ABNORMAL HIGH (ref 70–99)

## 2022-06-08 MED ORDER — IPRATROPIUM-ALBUTEROL 0.5-2.5 (3) MG/3ML IN SOLN
3.0000 mL | Freq: Once | RESPIRATORY_TRACT | Status: AC
  Administered 2022-06-08: 3 mL via RESPIRATORY_TRACT
  Filled 2022-06-08: qty 3

## 2022-06-08 MED ORDER — IPRATROPIUM-ALBUTEROL 0.5-2.5 (3) MG/3ML IN SOLN
3.0000 mL | Freq: Four times a day (QID) | RESPIRATORY_TRACT | Status: DC
  Administered 2022-06-08 – 2022-06-11 (×10): 3 mL via RESPIRATORY_TRACT
  Filled 2022-06-08 (×10): qty 3

## 2022-06-08 MED ORDER — SODIUM CHLORIDE 0.9 % IV SOLN
1.0000 g | INTRAVENOUS | Status: AC
  Administered 2022-06-08 – 2022-06-12 (×5): 1 g via INTRAVENOUS
  Filled 2022-06-08 (×4): qty 10
  Filled 2022-06-08: qty 1

## 2022-06-08 MED ORDER — OLANZAPINE 10 MG PO TBDP
5.0000 mg | ORAL_TABLET | Freq: Two times a day (BID) | ORAL | Status: DC
  Administered 2022-06-08 – 2022-06-13 (×10): 5 mg via ORAL
  Filled 2022-06-08: qty 0.5
  Filled 2022-06-08: qty 1
  Filled 2022-06-08: qty 0.5
  Filled 2022-06-08 (×3): qty 1
  Filled 2022-06-08: qty 0.5
  Filled 2022-06-08: qty 1
  Filled 2022-06-08: qty 0.5
  Filled 2022-06-08 (×3): qty 1

## 2022-06-08 MED ORDER — VITAMIN D3 25 MCG (1000 UNIT) PO TABS
2000.0000 [IU] | ORAL_TABLET | Freq: Every day | ORAL | Status: DC
  Administered 2022-06-09 – 2022-06-13 (×5): 2000 [IU] via ORAL
  Filled 2022-06-08 (×10): qty 2

## 2022-06-08 MED ORDER — DIGOXIN 125 MCG PO TABS
0.1250 mg | ORAL_TABLET | Freq: Every day | ORAL | Status: DC
  Administered 2022-06-10 – 2022-06-13 (×4): 0.125 mg via ORAL
  Filled 2022-06-08 (×5): qty 1

## 2022-06-08 MED ORDER — PRAMIPEXOLE DIHYDROCHLORIDE 0.25 MG PO TABS
0.5000 mg | ORAL_TABLET | Freq: Every day | ORAL | Status: DC
  Administered 2022-06-08 – 2022-06-12 (×5): 0.5 mg via ORAL
  Filled 2022-06-08 (×7): qty 2

## 2022-06-08 MED ORDER — PYRIDOSTIGMINE BROMIDE 60 MG PO TABS
30.0000 mg | ORAL_TABLET | Freq: Three times a day (TID) | ORAL | Status: DC
Start: 2022-06-08 — End: 2022-06-09
  Administered 2022-06-08 – 2022-06-09 (×3): 30 mg via ORAL
  Filled 2022-06-08: qty 1
  Filled 2022-06-08: qty 0.5
  Filled 2022-06-08: qty 1

## 2022-06-08 MED ORDER — PANTOPRAZOLE SODIUM 40 MG PO TBEC
40.0000 mg | DELAYED_RELEASE_TABLET | Freq: Every day | ORAL | Status: DC
  Administered 2022-06-09 – 2022-06-13 (×5): 40 mg via ORAL
  Filled 2022-06-08 (×5): qty 1

## 2022-06-08 MED ORDER — MAGNESIUM HYDROXIDE 400 MG/5ML PO SUSP: 30.0000 mL | Freq: Every day | ORAL | Status: DC | PRN

## 2022-06-08 MED ORDER — EMPAGLIFLOZIN 25 MG PO TABS
25.0000 mg | ORAL_TABLET | Freq: Every day | ORAL | Status: DC
  Administered 2022-06-09 – 2022-06-13 (×5): 25 mg via ORAL
  Filled 2022-06-08 (×5): qty 1

## 2022-06-08 MED ORDER — INSULIN ASPART 100 UNIT/ML IJ SOLN
0.0000 [IU] | Freq: Three times a day (TID) | INTRAMUSCULAR | Status: DC
  Administered 2022-06-08 – 2022-06-09 (×3): 3 [IU] via SUBCUTANEOUS
  Administered 2022-06-09 (×2): 5 [IU] via SUBCUTANEOUS
  Administered 2022-06-10: 3 [IU] via SUBCUTANEOUS
  Administered 2022-06-10: 15 [IU] via SUBCUTANEOUS
  Administered 2022-06-10: 2 [IU] via SUBCUTANEOUS
  Administered 2022-06-11: 3 [IU] via SUBCUTANEOUS
  Administered 2022-06-11: 5 [IU] via SUBCUTANEOUS
  Administered 2022-06-11: 11 [IU] via SUBCUTANEOUS
  Administered 2022-06-11: 2 [IU] via SUBCUTANEOUS
  Administered 2022-06-12 (×2): 8 [IU] via SUBCUTANEOUS
  Administered 2022-06-12: 5 [IU] via SUBCUTANEOUS
  Administered 2022-06-12: 3 [IU] via SUBCUTANEOUS
  Administered 2022-06-13: 5 [IU] via SUBCUTANEOUS
  Administered 2022-06-13: 2 [IU] via SUBCUTANEOUS
  Filled 2022-06-08 (×18): qty 1

## 2022-06-08 MED ORDER — TRAZODONE HCL 50 MG PO TABS: 25.0000 mg | ORAL_TABLET | Freq: Every evening | ORAL | Status: DC | PRN

## 2022-06-08 MED ORDER — ACETAMINOPHEN 325 MG PO TABS
650.0000 mg | ORAL_TABLET | Freq: Four times a day (QID) | ORAL | Status: DC | PRN
  Administered 2022-06-09: 650 mg via ORAL
  Filled 2022-06-08: qty 2

## 2022-06-08 MED ORDER — METHYLPREDNISOLONE SODIUM SUCC 125 MG IJ SOLR
80.0000 mg | INTRAMUSCULAR | Status: DC
Start: 2022-06-09 — End: 2022-06-12
  Administered 2022-06-09 – 2022-06-12 (×4): 80 mg via INTRAVENOUS
  Filled 2022-06-08 (×4): qty 2

## 2022-06-08 MED ORDER — TIOTROPIUM BROMIDE MONOHYDRATE 18 MCG IN CAPS
18.0000 ug | ORAL_CAPSULE | Freq: Every day | RESPIRATORY_TRACT | Status: DC
  Administered 2022-06-09 – 2022-06-13 (×5): 18 ug via RESPIRATORY_TRACT
  Filled 2022-06-08 (×2): qty 5

## 2022-06-08 MED ORDER — SACUBITRIL-VALSARTAN 24-26 MG PO TABS
1.0000 | ORAL_TABLET | Freq: Two times a day (BID) | ORAL | Status: DC
  Administered 2022-06-08 – 2022-06-13 (×10): 1 via ORAL
  Filled 2022-06-08 (×10): qty 1

## 2022-06-08 MED ORDER — APIXABAN 5 MG PO TABS
5.0000 mg | ORAL_TABLET | Freq: Two times a day (BID) | ORAL | Status: DC
  Administered 2022-06-08 – 2022-06-09 (×2): 5 mg via ORAL
  Filled 2022-06-08 (×2): qty 1

## 2022-06-08 MED ORDER — METHYLPREDNISOLONE SODIUM SUCC 125 MG IJ SOLR
125.0000 mg | Freq: Once | INTRAMUSCULAR | Status: AC
  Administered 2022-06-08: 125 mg via INTRAVENOUS
  Filled 2022-06-08: qty 2

## 2022-06-08 MED ORDER — VITAMIN B-12 1000 MCG PO TABS
1000.0000 ug | ORAL_TABLET | Freq: Every day | ORAL | Status: DC
  Administered 2022-06-09 – 2022-06-13 (×5): 1000 ug via ORAL
  Filled 2022-06-08 (×5): qty 1

## 2022-06-08 MED ORDER — TERBINAFINE HCL 250 MG PO TABS
250.0000 mg | ORAL_TABLET | Freq: Every day | ORAL | Status: DC
  Administered 2022-06-09 – 2022-06-13 (×5): 250 mg via ORAL
  Filled 2022-06-08 (×5): qty 1

## 2022-06-08 MED ORDER — FUROSEMIDE 10 MG/ML IJ SOLN
60.0000 mg | Freq: Once | INTRAMUSCULAR | Status: AC
  Administered 2022-06-08: 60 mg via INTRAVENOUS
  Filled 2022-06-08: qty 8

## 2022-06-08 MED ORDER — ACETAMINOPHEN 650 MG RE SUPP: 650.0000 mg | Freq: Four times a day (QID) | RECTAL | Status: DC | PRN

## 2022-06-08 MED ORDER — ONDANSETRON HCL 4 MG/2ML IJ SOLN: 4.0000 mg | Freq: Four times a day (QID) | INTRAMUSCULAR | Status: DC | PRN

## 2022-06-08 MED ORDER — ONDANSETRON HCL 4 MG PO TABS: 4.0000 mg | ORAL_TABLET | Freq: Four times a day (QID) | ORAL | Status: DC | PRN

## 2022-06-08 MED ORDER — METOPROLOL SUCCINATE ER 50 MG PO TB24
50.0000 mg | ORAL_TABLET | Freq: Every day | ORAL | Status: DC
  Administered 2022-06-10: 50 mg via ORAL
  Filled 2022-06-08 (×2): qty 1

## 2022-06-08 MED ORDER — FUROSEMIDE 10 MG/ML IJ SOLN
40.0000 mg | Freq: Two times a day (BID) | INTRAMUSCULAR | Status: DC
  Administered 2022-06-09 – 2022-06-13 (×9): 40 mg via INTRAVENOUS
  Filled 2022-06-08 (×9): qty 4

## 2022-06-08 MED ORDER — PRAVASTATIN SODIUM 20 MG PO TABS
20.0000 mg | ORAL_TABLET | Freq: Every day | ORAL | Status: DC
  Administered 2022-06-09 – 2022-06-13 (×5): 20 mg via ORAL
  Filled 2022-06-08 (×5): qty 1

## 2022-06-08 NOTE — ED Provider Notes (Signed)
[facility] Provider Note    Event Date/Time   First MD Initiated Contact with Patient 06/08/22 1551     (approximate)   History   Shortness of Breath   HPI  [facility] is a 76 y.o. male who presents to the ED for evaluation of Shortness of Breath   I reviewed PCP visit from earlier today where he was seen acutely for malaise.  He has a history of myasthenia gravis, combined CHF, COPD, A-fib on Eliquis.  He was hypoxic in clinic and directed to the ED. Recent prolonged admission 5/21 - 6/8 .  He had presented to the ED hypoxic, unfortunately arrested but was resuscitated, extubated 5/24.  IVIG starting 5/31.  Patient presents to the ED with his wife for evaluation of increasing weakness, leg swelling and shortness of breath.  He reports primarily malaise and leg swelling with some weight gain.  Reports he came home from rehab this past Saturday.   Denies fevers, increased sputum production, cough, chest pain, abdominal pain, syncope.   Physical Exam   Triage Vital Signs: ED Triage Vitals  Enc Vitals Group     BP 06/08/22 1437 105/62     Pulse Rate 06/08/22 1437 (!) 56     Resp 06/08/22 1437 20     Temp 06/08/22 1437 98.5 F (36.9 C)     Temp Source 06/08/22 1437 Oral     SpO2 06/08/22 1437 94 %     Weight 06/08/22 1434 233 lb 8 oz (105.9 kg)     Height 06/08/22 1434 '5\' 6"'$  (1.676 m)     Head Circumference --      Peak Flow --      Pain Score 06/08/22 1433 10     Pain Loc --      Pain Edu? --      Excl. in [place]? --     Most recent vital signs: Vitals:   06/08/22 1437  BP: 105/62  Pulse: (!) 56  Resp: 20  Temp: 98.5 F (36.9 C)  SpO2: 94%    General: Awake, no distress.  Obese, sitting up in a wheelchair.  With my assistance, he is able to transfer to the stretcher.  Seems somewhat dyspneic, but conversational. CV:  Good peripheral perfusion.  Resp:  A little tachypneic to the mid 20s.  Diffuse expiratory wheezes. Abd:  No  distention.  MSK:  Pitting edema to bilateral lower extremities symmetrically.  Up to the knees Neuro:  No focal deficits appreciated. Other:     ED Results / Procedures / Treatments   Labs (all labs ordered are listed, but only abnormal results are displayed) Labs Reviewed  BASIC METABOLIC PANEL - Abnormal; Notable for the following components:      Result Value   CO2 33 (*)    Glucose, Bld 169 (*)    Calcium 7.7 (*)    All other components within normal limits  CBC - Abnormal; Notable for the following components:   WBC 13.0 (*)    MCV 102.8 (*)    MCHC 28.8 (*)    RDW 15.6 (*)    All other components within normal limits  CULTURE, BLOOD (ROUTINE X 2)  CULTURE, BLOOD (ROUTINE X 2)  SARS CORONAVIRUS 2 BY RT PCR  URINALYSIS, ROUTINE W REFLEX MICROSCOPIC  PROCALCITONIN  HEPATIC FUNCTION PANEL  LACTIC ACID, PLASMA  LACTIC ACID, PLASMA  PROTIME-INR  APTT  BRAIN NATRIURETIC PEPTIDE  TROPONIN I (HIGH SENSITIVITY)  EKG Sinus rhythm with a rate of 58 bpm.  Leftward axis and normal intervals.  Nonspecific ST changes anteriorly and inferiorly without STEMI.  RADIOLOGY 2 view CXR interpreted by me with bibasilar opacities  Official radiology report(s): DG Chest 2 View  Result Date: 06/08/2022 CLINICAL DATA:  Hypoxia, shortness of breath EXAM: CHEST - 2 VIEW COMPARISON:  Previous studies including the examination of 05/13/2022 FINDINGS: Transverse diameter of heart is increased. Central pulmonary vessels are slightly less prominent. There are linear densities in both lower lung fields. There is blunting of both lateral CP angles suggesting small bilateral pleural effusions, more so on the right side. There is no pneumothorax. IMPRESSION: Cardiomegaly. There are no signs of pulmonary edema. There are linear densities in the lower lung fields suggesting subsegmental atelectasis/pneumonia. Small bilateral pleural effusions, more so on the right side. Electronically Signed   By:  Elmer Picker M.D.   On: 06/08/2022 15:15    PROCEDURES and INTERVENTIONS:  .1-3 Lead EKG Interpretation  Performed by: Vladimir Crofts, MD Authorized by: Vladimir Crofts, MD     Interpretation: normal     ECG rate:  58   ECG rate assessment: normal     Rhythm: sinus rhythm     Ectopy: none     Conduction: normal   .Critical Care  Performed by: Vladimir Crofts, MD Authorized by: Vladimir Crofts, MD   Critical care provider statement:    Critical care time (minutes):  30   Critical care time was exclusive of:  Separately billable procedures and treating other patients   Critical care was necessary to treat or prevent imminent or life-threatening deterioration of the following conditions:  Respiratory failure   Critical care was time spent personally by me on the following activities:  Development of treatment plan with patient or surrogate, discussions with consultants, evaluation of patient's response to treatment, examination of patient, ordering and review of laboratory studies, ordering and review of radiographic studies, ordering and performing treatments and interventions, pulse oximetry, re-evaluation of patient's condition and review of old charts   Medications  ipratropium-albuterol (DUONEB) 0.5-2.5 (3) MG/3ML nebulizer solution 3 mL (has no administration in time range)  methylPREDNISolone sodium succinate (SOLU-MEDROL) 125 mg/2 mL injection 125 mg (has no administration in time range)     IMPRESSION / MDM / ASSESSMENT AND PLAN / ED COURSE  I reviewed the triage vital signs and the nursing notes.  Differential diagnosis includes, but is not limited to, CHF exacerbation, COPD exacerbation, ACS, sepsis, MG exacerbation  {Patient presents with symptoms of an acute illness or injury that is potentially life-threatening.  76 year old male with multiple medical comorbidities presents to the ED with malaise, leg swelling and hypoxia with evidence of CHF and COPD exacerbations  requiring medical admission.  He is conversational, but somewhat tachypneic and dyspneic but largely looks well.  He has increased lower extremity edema and signs of CHF exacerbation.  Wheezing and signs of COPD exacerbation on exam.  Blood work with mild leukocytosis to 13, but no other SIRS criteria or stigmata of sepsis.  Elevated BNP further suggestive of CHF.  Metabolic panel is largely reassuring.  Troponin is marginally elevated and likely secondary to his respiratory status.  His x-ray questions bibasilar infiltrates versus atelectasis, considering his stigmata of COPD exacerbation I suspect atelectasis.  His procalcitonin is negative and he does not have symptoms to suggest pneumonia.  Cultures were drawn but antibiotics not provided in the ED.  We will consult medicine for admission  FINAL CLINICAL IMPRESSION(S) / ED DIAGNOSES   Final diagnoses:  Acute respiratory failure with hypoxia (HCC)  COPD exacerbation (HCC)  Acute on chronic combined systolic and diastolic congestive heart failure ([place])     Rx / DC Orders   ED Discharge Orders     None        Note:  This document was prepared using Dragon voice recognition software and may include unintentional dictation errors.   Vladimir Crofts, MD 06/08/22 [phone]

## 2022-06-08 NOTE — ED Triage Notes (Signed)
Pt reports has not been on his oxygen and his levels are low in the 70's. Pt was advised to come to the ED. Pt wife reports she is not sure if he needed to stay on O2 or not but pt states he thought he was when he was released from a rehab facility.

## 2022-06-08 NOTE — Progress Notes (Signed)
I,Sha'taria Tyson,acting as a Education administrator for [employer], PA-C.,have documented all relevant documentation on the behalf of Joseph Kirschner, PA-C,as directed by  Joseph Kirschner, PA-C while in the presence of Joseph Kirschner, PA-C.   Acute Office Visit  Subjective:     Patient ID: Joseph [facility], male    DOB: 02-20-1946, 76 y.o.   MRN: [id number]  Cc. Leg pain, fatigue, malaise  Joseph [facility] is a 76 y/o male with a PMH of myasthenia gravis, HTN, HFpEF, COPD, afib on Eliquis, chronic respiratory failure who was recently d/c from [facility] earlier this month for hypoxia causing cardiac arrest, thought to be from aspiration pneumonia.   Reports finishing rehab and feeling worse since then. Reports intense pain b/l legs/knees, overall weakness, inability to walk. MG flares are getting worse. Hard to stay awake. Denies any home health. Denies any O2 at home. Reports current fatigue, malaise, leg pain, leg swelling/redness, double vision. States double vision is secondary to MG flare. Denies current SOB, chest pain, headache.     Objective:    Blood pressure (!) 94/53, height '5\' 6"'$  (1.676 m), SpO2 (!) 77 %.   Physical Exam Constitutional:      Appearance: He is ill-appearing.  Cardiovascular:     Rate and Rhythm: Regular rhythm. Bradycardia present.     Pulses: Normal pulses.  Pulmonary:     Effort: Pulmonary effort is normal. No respiratory distress.     Breath sounds: Normal breath sounds.  Musculoskeletal:     Right lower leg: 4+ Pitting Edema present.     Left lower leg: 4+ Pitting Edema present.     Comments: L>R for swelling and erythema.   Neurological:     Mental Status: He is oriented to person, place, and time. He is lethargic.     Results for orders placed or performed during the hospital encounter of 18/56/31  Basic metabolic panel  Result Value Ref Range   Sodium 141 135 - 145 mmol/L   Potassium 4.8 3.5 - 5.1 mmol/L   Chloride 98 98 - 111 mmol/L   CO2 33 (H) 22 - 32 mmol/L    Glucose, Bld 169 (H) 70 - 99 mg/dL   BUN 20 8 - 23 mg/dL   Creatinine, Ser 0.96 0.61 - 1.24 mg/dL   Calcium 7.7 (L) 8.9 - 10.3 mg/dL   GFR, Estimated >60 >60 mL/min   Anion gap 10 5 - 15  CBC  Result Value Ref Range   WBC 13.0 (H) 4.0 - 10.5 K/uL   RBC 4.59 4.22 - 5.81 MIL/uL   Hemoglobin 13.6 13.0 - 17.0 g/dL   HCT 47.2 39.0 - 52.0 %   MCV 102.8 (H) 80.0 - 100.0 fL   MCH 29.6 26.0 - 34.0 pg   MCHC 28.8 (L) 30.0 - 36.0 g/dL   RDW 15.6 (H) 11.5 - 15.5 %   Platelets 194 150 - 400 K/uL   nRBC 0.2 0.0 - 0.2 %        Assessment & Plan:   Problem List Items Addressed This Visit       Respiratory   Hypoxia - Primary    After triage advised pt to go straight to ED vs ambulance d/t hypoxia. Accompanied by wife, going to ED.  As pt going straight there did not give O2 in office.  No current respiratory distress, but hypoxia, hypotension, illappearing unclear etiology--acute on chronic CHF, repeat aspiration pneumonia, PE?          I, Ria Comment  Jamal Pavon, PA-C have reviewed all documentation for this visit. The documentation on  06/08/2022 for the exam, diagnosis, procedures, and orders are all accurate and complete.  Joseph Kirschner, PA-C [facility] [address] [place], [place], [address] Office: [phone] Fax: [phone]

## 2022-06-08 NOTE — ED Notes (Signed)
Pt SPO2 85% on RA, pt placed on 4 L [place], Pt Spo2 now 95%

## 2022-06-08 NOTE — H&P (Signed)
[place]   PATIENT NAME: Joseph Macdonald    MR#:  [id number]  DATE OF BIRTH:  1946/01/13  DATE OF ADMISSION:  06/08/2022  PRIMARY CARE PHYSICIAN: [name], MD   Patient is coming from: Home  REQUESTING/REFERRING PHYSICIAN: Vladimir Crofts, MD  CHIEF COMPLAINT:   Chief Complaint  Patient presents with  . Shortness of Breath    HISTORY OF PRESENT ILLNESS:  Joseph Macdonald is a 75 y.o. male with medical history significant for COPD, type diabetes mellitus, CHF, PE, hypertension, paroxysmal atrial fibrillation, presented to the emergency room with acute onset of worsening dyspnea with cough productive of yellowish sputum and wheezing over the last 4 days.  He admitted to dyspnea on exertion as well as orthopnea and worsening lower extremity edema with paroxysmal nocturnal dyspnea.  No nausea or vomiting or abdominal pain.  No dysuria, oliguria or hematuria or flank pain.  He has been occasionally somnolent in the emergency room.  He admitted to recent weight gain and generalized weakness.  He had a recent admission here from 5/21 till 05/18/2022 for cardiac arrest for which he was intubated and was treated for acute on chronic HFrEF and acute respiratory failure, hypertensive urgency and paroxysmal atrial fibrillation.  He was discharged to SNF and got out to his home about a week ago.  ED Course: When he came to the ER heart rate was 56 and pulse symmetry was 94% on 2 L and 90% on 3 L O2 by nasal cannula with otherwise normal vital signs.  Labs revealed an ABG with PCO2 of 75 and HCO3 of 41.4 with pH 7.35 and PO2 of 88 with an O2 sat of 97.8% on 4 L of O2 by nasal cannula.  LFTs were within normal.  BNP was 714.7, high-sensitivity troponin I 19 and later 16 lactic acid 1.3 with procalcitonin less then 0.1 with normal coags and BMP remarkable for glucose of 169 and CO2 33.  CBC showed leukocytosis 13 with macrocytosis.  UA showed more than 500 glucose. EKG as reviewed by me : EKG  showed sinus bradycardia with a rate of 58 with left axis deviation and poor R wave progression with T wave inversion inferiorly Imaging: Two-view chest x-ray showed cardiomegaly with linear density in the lower lung fields suggesting subsegmental atelectasis with small bilateral pleural effusions more so on the right.  The patient was given 60 mg of IV Lasix, DuoNeb and 125 mg of IV Solu-Medrol.  He will be admitted to a cardiac telemetry bed for further evaluation and management. PAST MEDICAL HISTORY:   Past Medical History:  Diagnosis Date  . Arthritis   . COPD (chronic obstructive pulmonary disease) ([place])   . Diabetes mellitus without complication (HCC)    diet controlled  . Dyspnea    with activity  . Hyperlipidemia   . Hypertension   . Myasthenia gravis ([place])   . Oxygen deficit   . Ventricular tachycardia (HCC)   HFrEF,Paroxysmal atrial fibrillation and history of cardiac arrest, history of PE.  PAST SURGICAL HISTORY:   Past Surgical History:  Procedure Laterality Date  . carpel tunnel sx    . CATARACT EXTRACTION     right eye  . CATARACT EXTRACTION W/PHACO Left 12/01/2020   Procedure: CATARACT EXTRACTION PHACO AND INTRAOCULAR LENS PLACEMENT (IOC) LEFT 10.83 01:14.1 14.6%;  Surgeon: Leandrew Koyanagi, MD;  Location: [facility];  Service: Ophthalmology;  Laterality: Left;  . INTRAVASCULAR PRESSURE WIRE/FFR STUDY N/A 05/16/2022  Procedure: INTRAVASCULAR PRESSURE WIRE/FFR STUDY;  Surgeon: Nelva Bush, MD;  Location: [facility];  Service: Cardiovascular;  Laterality: N/A;  . RETINAL DETACHMENT SURGERY    . RIGHT/LEFT HEART CATH AND CORONARY ANGIOGRAPHY N/A 05/16/2022   Procedure: RIGHT/LEFT HEART CATH AND CORONARY ANGIOGRAPHY;  Surgeon: Nelva Bush, MD;  Location: [facility];  Service: Cardiovascular;  Laterality: N/A;  . SKIN GRAFT     Behind left knee  . TONSILLECTOMY AND ADENOIDECTOMY      SOCIAL HISTORY:   Social History    Tobacco Use  . Smoking status: Former    Packs/day: 0.50    Years: 40.00    Total pack years: 20.00    Types: Pipe, Cigarettes    Quit date: 08/19/2017    Years since quitting: 4.8  . Smokeless tobacco: Former    Quit date: 08/19/2017  . Tobacco comments:    previously smoked 1 1/2 pack since his 76 years old  Quit 2018  Substance Use Topics  . Alcohol use: Yes    Comment: occasionally- monthly or less    FAMILY HISTORY:   Family History  Problem Relation Age of Onset  . Hypertension Mother   . Thyroid disease Mother   . Kidney disease Mother   . Arthritis Mother   . Stroke Father   . Heart disease Father 33  . Arthritis Father   . Heart attack Father   . Arthritis Other     DRUG ALLERGIES:   Allergies  Allergen Reactions  . Lisinopril Swelling    REVIEW OF SYSTEMS:   ROS As per history of present illness. All pertinent systems were reviewed above. Constitutional, HEENT, cardiovascular, respiratory, GI, GU, musculoskeletal, neuro, psychiatric, endocrine, integumentary and hematologic systems were reviewed and are otherwise negative/unremarkable except for positive findings mentioned above in the HPI.   MEDICATIONS AT HOME:   Prior to Admission medications   Medication Sig Start Date End Date Taking? Authorizing Provider  albuterol (PROVENTIL HFA;VENTOLIN HFA) 108 (90 Base) MCG/ACT inhaler Inhale into the lungs every 6 (six) hours as needed for wheezing or shortness of breath.    [provider]  apixaban (ELIQUIS) 5 MG TABS tablet Take 5 mg by mouth 2 (two) times daily.    [provider]  budesonide-formoterol (SYMBICORT) 160-4.5 MCG/ACT inhaler Inhale 2 puffs into the lungs 2 (two) times daily. 06/16/19   [name], MD  cholecalciferol (VITAMIN D) 25 MCG (1000 UNIT) tablet Take 2,000 Units by mouth daily.    [provider]  digoxin (LANOXIN) 0.125 MG tablet Take 1 tablet (0.125 mg total) by mouth daily. 05/18/22   Max Sane, MD   empagliflozin (JARDIANCE) 25 MG TABS tablet Take 25 mg by mouth daily.    [provider]  furosemide (LASIX) 40 MG tablet Take 1 tablet (40 mg total) by mouth 2 (two) times daily. 05/18/22   Max Sane, MD  metoprolol succinate (TOPROL-XL) 50 MG 24 hr tablet Take 1 tablet (50 mg total) by mouth daily. Take with or immediately following a meal. 05/18/22 06/17/22  Max Sane, MD  OLANZapine zydis (ZYPREXA) 5 MG disintegrating tablet Take 1 tablet (5 mg total) by mouth 2 (two) times daily. 05/18/22   Max Sane, MD  omeprazole (PRILOSEC) 40 MG capsule Take 40 mg by mouth 2 (two) times daily.     [provider]  pramipexole (MIRAPEX) 0.5 MG tablet Take 1 tablet (0.5 mg total) by mouth at bedtime. 06/04/21   Lelon Huh  E, MD  pravastatin (PRAVACHOL) 20 MG tablet Take 20 mg by mouth daily.    [provider]  predniSONE (DELTASONE) 20 MG tablet Take 15 mg by mouth daily with breakfast.    [provider]  pyridostigmine (MESTINON) 60 MG tablet Take 30 mg by mouth 3 (three) times daily.    [provider]  sacubitril-valsartan (ENTRESTO) 24-26 MG Take 1 tablet by mouth 2 (two) times daily. 05/22/22   Minna Merritts, MD  terbinafine (LAMISIL) 250 MG tablet Take 250 mg by mouth daily. 06/03/21   [provider]  tiotropium (SPIRIVA) 18 MCG inhalation capsule Place 18 mcg into inhaler and inhale daily.    [provider]  vitamin B-12 (CYANOCOBALAMIN) 1000 MCG tablet Take 1,000 mcg by mouth daily.    [provider]      VITAL SIGNS:  Blood pressure 139/70, pulse 60, temperature 98 F (36.7 C), resp. rate 18, height '5\' 6"'$  (1.676 m), weight 104 kg, SpO2 96 %.  PHYSICAL EXAMINATION:  Physical Exam  GENERAL:  76 y.o.-year-old Caucasian male patient lying in the bed with no acute distress.  EYES: Pupils equal, round, reactive to light and accommodation. No scleral icterus. Extraocular muscles intact.  HEENT: Head atraumatic,  normocephalic. Oropharynx and nasopharynx clear.  NECK:  Supple, no jugular venous distention. No thyroid enlargement, no tenderness.  LUNGS: Diminished bibasilar breath sounds with bibasilar rales and expiratory wheezes with diminished expiratory airflow and harsh vesicular breathing.. No use of accessory muscles of respiration.  CARDIOVASCULAR: Regular rate and rhythm, S1, S2 normal. No murmurs, rubs, or gallops.  ABDOMEN: Soft, nondistended, nontender. Bowel sounds present. No organomegaly or mass.  EXTREMITIES: 2+ bilateral lower extremity pitting edema with no cyanosis, or clubbing.  NEUROLOGIC: Cranial nerves II through XII are intact. Muscle strength 5/5 in all extremities. Sensation intact. Gait not checked.  PSYCHIATRIC: The patient is alert and oriented x 3.  Normal affect and good eye contact. SKIN: No obvious rash, lesion, or ulcer.   LABORATORY PANEL:   CBC Recent Labs  Lab 06/08/22 1436  WBC 13.0*  HGB 13.6  HCT 47.2  PLT 194   ------------------------------------------------------------------------------------------------------------------  Chemistries  Recent Labs  Lab 06/08/22 1436 06/08/22 1614  NA 141  --   K 4.8  --   CL 98  --   CO2 33*  --   GLUCOSE 169*  --   BUN 20  --   CREATININE 0.96  --   CALCIUM 7.7*  --   AST  --  18  ALT  --  17  ALKPHOS  --  118  BILITOT  --  0.5   ------------------------------------------------------------------------------------------------------------------  Cardiac Enzymes No results for input(s): "TROPONINI" in the last 168 hours. ------------------------------------------------------------------------------------------------------------------  RADIOLOGY:  DG Chest 2 View  Result Date: 06/08/2022 CLINICAL DATA:  Hypoxia, shortness of breath EXAM: CHEST - 2 VIEW COMPARISON:  Previous studies including the examination of 05/13/2022 FINDINGS: Transverse diameter of heart is increased. Central pulmonary vessels are  slightly less prominent. There are linear densities in both lower lung fields. There is blunting of both lateral CP angles suggesting small bilateral pleural effusions, more so on the right side. There is no pneumothorax. IMPRESSION: Cardiomegaly. There are no signs of pulmonary edema. There are linear densities in the lower lung fields suggesting subsegmental atelectasis/pneumonia. Small bilateral pleural effusions, more so on the right side. Electronically Signed   By: Elmer Picker M.D.   On: 06/08/2022 15:15  IMPRESSION AND PLAN:  Assessment and Plan: * Acute on chronic HFrEF (heart failure with reduced ejection fraction) ([place]) - The patient will be admitted to a cardiac telemetry bed. - We will continue diuresis with IV Lasix. - We will follow serial troponins. - He had a recent 2D echo that revealed an EF of 35 to 40% and grade 1 diastolic dysfunction on 0/94/7096. - Cardiology consult can be sought as needed in AM. - We will continue Entresto and Toprol-XL.  COPD with acute exacerbation ([place]) - We will continue steroid therapy with IV Solu-Medrol and bronchodilator therapy with DuoNebs 4 times daily and every 4 hours as needed as well as antibiotic therapy with IV Rocephin. - We will hold off Symbicort.  Acute respiratory failure with hypoxia and hypercarbia (HCC) - We recommended BiPAP though the patient refused. - We will continue monitoring his mental status.  He is currently alert and capable of making decisions. - O2 protocol will be followed.  Paroxysmal atrial fibrillation (HCC) - We will continue Eliquis, Toprol-XL and digoxin.  Myasthenia gravis ([place]) - We will continue IV Distigmine.  The patient will be on IV steroid therapy as mentioned above.  Dyslipidemia - We will continue statin therapy.       DVT prophylaxis: Lovenox.  Advanced Care Planning:  Code Status: DNR/DNI.  This was discussed with him.   Family Communication:  The plan of care was  discussed in details with the patient (and family). I answered all questions. The patient agreed to proceed with the above mentioned plan. Further management will depend upon hospital course. Disposition Plan: Back to previous home environment Consults called: none.  All the records are reviewed and case discussed with ED provider.  Status is: Inpatient   At the time of the admission, it appears that the appropriate admission status for this patient is inpatient.  This is judged to be reasonable and necessary in order to provide the required intensity of service to ensure the patient's safety given the presenting symptoms, physical exam findings and initial radiographic and laboratory data in the context of comorbid conditions.  The patient requires inpatient status due to high intensity of service, high risk of further deterioration and high frequency of surveillance required.  I certify that at the time of admission, it is my clinical judgment that the patient will require inpatient hospital care extending more than 2 midnights.                            Dispo: The patient is from: Home              Anticipated d/c is to: Home              Patient currently is not medically stable to d/c.              Difficult to place patient: No  Christel Mormon M.D on 06/09/2022 at 3:15 AM  [facility]   From 7 PM-7 AM, contact night-coverage [email or web address]  CC: Primary care physician; [name], MD

## 2022-06-08 NOTE — Progress Notes (Signed)
Explained to patient the need for bipap secondary to elevated carbon dioxide. Patient states he is suppose to wear one at home but can not wear one due to his claustrophobia. Patient states he can not and will not wear hospital unit either, MD is aware.

## 2022-06-08 NOTE — Assessment & Plan Note (Addendum)
After triage advised pt to go straight to ED vs ambulance d/t hypoxia. Accompanied by wife, going to ED.  As pt going straight there did not give O2 in office.  No current respiratory distress, but hypoxia, hypotension, illappearing unclear etiology--acute on chronic CHF, repeat aspiration pneumonia, PE?

## 2022-06-08 NOTE — ED Notes (Signed)
Pt Spo2 88% on 2 L, pt placed on 4 L [place], spo2 95%

## 2022-06-08 NOTE — ED Triage Notes (Addendum)
Patient to ER for c/o low O2 sats at MD's office, low energy with recent discharge from rehab (d/t being hospitalized with CPR and intubation end of May).  Wife states she was told patient had PNA and "collapsed lung" while in hospital.

## 2022-06-08 NOTE — Progress Notes (Signed)
PHARMACIST - PHYSICIAN COMMUNICATION   CONCERNING: Methylprednisolone IV    Current order: Methylprednisolone IV '40mg'$  IV q 12 hrs     DESCRIPTION: Per [facility] [place] Protocol:   IV methylprednisolone will be converted to either a q12h or q24h frequency with the same total daily dose (TDD).  Ordered Dose: 1 to 125 mg TDD; convert to: TDD q24h.  Ordered Dose: 126 to 250 mg TDD; convert to: TDD div q12h.  Ordered Dose: >250 mg TDD; DAW.  Order has been adjusted to: Methylprednisolone IV '80mg'$  IV q 24 hts   Ludivina Guymon Rodriguez-Guzman PharmD, BCPS 06/08/2022 8:01 PM

## 2022-06-09 DIAGNOSIS — J441 Chronic obstructive pulmonary disease with (acute) exacerbation: Secondary | ICD-10-CM | POA: Diagnosis present

## 2022-06-09 DIAGNOSIS — E785 Hyperlipidemia, unspecified: Secondary | ICD-10-CM | POA: Diagnosis present

## 2022-06-09 DIAGNOSIS — J9601 Acute respiratory failure with hypoxia: Secondary | ICD-10-CM | POA: Diagnosis not present

## 2022-06-09 DIAGNOSIS — G7000 Myasthenia gravis without (acute) exacerbation: Secondary | ICD-10-CM | POA: Diagnosis not present

## 2022-06-09 DIAGNOSIS — I5043 Acute on chronic combined systolic (congestive) and diastolic (congestive) heart failure: Secondary | ICD-10-CM | POA: Diagnosis not present

## 2022-06-09 DIAGNOSIS — J9602 Acute respiratory failure with hypercapnia: Secondary | ICD-10-CM | POA: Diagnosis present

## 2022-06-09 DIAGNOSIS — G8929 Other chronic pain: Secondary | ICD-10-CM | POA: Diagnosis present

## 2022-06-09 LAB — CBC
HCT: 45.4 % (ref 39.0–52.0)
Hemoglobin: 13.3 g/dL (ref 13.0–17.0)
MCH: 29.7 pg (ref 26.0–34.0)
MCHC: 29.3 g/dL — ABNORMAL LOW (ref 30.0–36.0)
MCV: 101.3 fL — ABNORMAL HIGH (ref 80.0–100.0)
Platelets: 158 10*3/uL (ref 150–400)
RBC: 4.48 MIL/uL (ref 4.22–5.81)
RDW: 15.3 % (ref 11.5–15.5)
WBC: 11.3 10*3/uL — ABNORMAL HIGH (ref 4.0–10.5)
nRBC: 0 % (ref 0.0–0.2)

## 2022-06-09 LAB — GLUCOSE, CAPILLARY
Glucose-Capillary: 173 mg/dL — ABNORMAL HIGH (ref 70–99)
Glucose-Capillary: 248 mg/dL — ABNORMAL HIGH (ref 70–99)
Glucose-Capillary: 202 mg/dL — ABNORMAL HIGH (ref 70–99)
Glucose-Capillary: 174 mg/dL — ABNORMAL HIGH (ref 70–99)

## 2022-06-09 LAB — BASIC METABOLIC PANEL
Anion gap: 8 (ref 5–15)
BUN: 21 mg/dL (ref 8–23)
CO2: 36 mmol/L — ABNORMAL HIGH (ref 22–32)
Calcium: 7.6 mg/dL — ABNORMAL LOW (ref 8.9–10.3)
Chloride: 98 mmol/L (ref 98–111)
Creatinine, Ser: 0.84 mg/dL (ref 0.61–1.24)
GFR, Estimated: >60 mL/min (ref >60)
Glucose, Bld: 187 mg/dL — ABNORMAL HIGH (ref 70–99)
Potassium: 4.3 mmol/L (ref 3.5–5.1)
Sodium: 142 mmol/L (ref 135–145)

## 2022-06-09 LAB — HEPARIN LEVEL (UNFRACTIONATED): Heparin Unfractionated: 0.92 IU/mL — ABNORMAL HIGH (ref 0.30–0.70)

## 2022-06-09 LAB — PROCALCITONIN: Procalcitonin: <0.1 ng/mL

## 2022-06-09 LAB — APTT: aPTT: 28 seconds (ref 24–36)

## 2022-06-09 MED ORDER — HEPARIN (PORCINE) 25000 UT/250ML-% IV SOLN
1550.0000 [IU]/h | INTRAVENOUS | Status: DC
  Administered 2022-06-09: 1200 [IU]/h via INTRAVENOUS
  Administered 2022-06-10: 1400 [IU]/h via INTRAVENOUS
  Administered 2022-06-11: 1650 [IU]/h via INTRAVENOUS
  Administered 2022-06-11 – 2022-06-13 (×3): 1550 [IU]/h via INTRAVENOUS
  Filled 2022-06-09 (×6): qty 250

## 2022-06-09 MED ORDER — PYRIDOSTIGMINE BROMIDE 60 MG PO TABS
60.0000 mg | ORAL_TABLET | Freq: Three times a day (TID) | ORAL | Status: DC
  Administered 2022-06-09 – 2022-06-13 (×12): 60 mg via ORAL
  Filled 2022-06-09 (×12): qty 1

## 2022-06-09 MED ORDER — HYDROCODONE-ACETAMINOPHEN 5-325 MG PO TABS
1.0000 | ORAL_TABLET | Freq: Four times a day (QID) | ORAL | Status: DC
  Administered 2022-06-09 – 2022-06-13 (×17): 1 via ORAL
  Filled 2022-06-09 (×17): qty 1

## 2022-06-09 MED ORDER — TRAMADOL HCL 50 MG PO TABS
50.0000 mg | ORAL_TABLET | Freq: Four times a day (QID) | ORAL | Status: DC | PRN
  Administered 2022-06-09 – 2022-06-13 (×2): 50 mg via ORAL
  Filled 2022-06-09 (×2): qty 1

## 2022-06-09 NOTE — Assessment & Plan Note (Signed)
-   We will continue statin therapy. 

## 2022-06-09 NOTE — Progress Notes (Signed)
[facility] Progress Note  Patient: Joseph Macdonald    TDS:[id number]  DOA: 06/08/2022    Date of Service: the patient was seen and examined on 06/09/2022  Brief hospital course: 76 year old male with past medical history of COPD, diabetes mellitus, diastolic heart failure, paroxysmal A-fib and pulmonary embolus presented to the emergency room on 6/29 with complaints of cough and shortness of breath x4 days.  Patient had a recent hospitalization and was discharged on 6/8 for cardiac arrest for which she was intubated and then treated for heart failure and then discharged to skilled nursing.  Patient had just been discharged from skilled nursing and been at home for a week.  Patient found to be hypoxic and was admitted to the hospitalist service for CHF and COPD exacerbation.  Assessment and Plan: Assessment and Plan: Acute respiratory failure with hypoxia and hypercarbia (HCC) Initially recommended BiPAP though the patient refused. Is normally on 2 L at night and is currently on 4 L.  Secondary to both COPD and CHF.  Acute on chronic combined systolic and diastolic CHF (congestive heart failure) ([place])  - He had a recent 2D echo that revealed an EF of 35 to 40% and grade 1 diastolic dysfunction on 2/62/0355. To date has diuresed almost 1.5 L and is -730 mL deficient - We will continue Entresto and Toprol-XL although held this morning due to softer blood pressures.  COPD with acute exacerbation ([place]) - We will continue steroid therapy with IV Solu-Medrol and bronchodilator therapy with DuoNebs 4 times daily and every 4 hours as needed as well as antibiotic therapy with IV Rocephin. - We will hold off Symbicort.  Myasthenia gravis [facility]) Patient reported to me that prior to coming in the last few days he is noted his legs have been weaker.  He also noted some difficulty in talking which he feels like he has to formally sound out his words and this has happened before when his myasthenia  gravis has flared up.  Reflexes are blunted, but however this could be secondary to peripheral edema.  Have consulted neurology.  Speech therapy to see to check swallowing.  We will continue IV Distigmine.  The patient will be on IV steroid therapy as mentioned above.  Paroxysmal atrial fibrillation (HCC) - We will continue Eliquis, Toprol-XL and digoxin.  Chronic pain Patient normally on Percocet 5/325 every 6 hours scheduled.  Initially this was not on his med rec as wife forgot to mention it and he gets this medicine from a different pharmacy than the [place].  Started.  Dyslipidemia - We will continue statin therapy.  Morbid obesity ([place]) Meets criteria BMI greater than 35 & comorbidities of hypertension and heart failure       Body mass index is 36.99 kg/m.    Pressure Injury Buttocks Right Stage 2 -  Partial thickness loss of dermis presenting as a shallow open injury with a red, pink wound bed without slough. (Active)     Location: Buttocks  Location Orientation: Right  Staging: Stage 2 -  Partial thickness loss of dermis presenting as a shallow open injury with a red, pink wound bed without slough.  Wound Description (Comments):   Present on Admission: No  Dressing Type Foam - Lift dressing to assess site every shift 05/17/22 0745     Consultants: Neurology  Procedures: None  Antimicrobials: None  Code Status: Full code   Subjective: Patient states that breathing is a little bit better.  He does note leg weakness.  He notes that he feels like he is having some trouble swallowing as well as having to take time to properly speak his words (if he does not, they seem to slur together)  Objective: Vital signs were reviewed and unremarkable. Vitals:   06/09/22 1215 06/09/22 1541  BP: 114/64   Pulse: 72   Resp: 19   Temp: 97.8 F (36.6 C)   SpO2: 95% 95%    Intake/Output Summary (Last 24 hours) at 06/09/2022 1542 Last data filed at 06/09/2022 1445 Gross per 24  hour  Intake 720 ml  Output 1450 ml  Net -730 ml   Filed Weights   06/08/22 1434 06/08/22 1703 06/09/22 0105  Weight: 105.9 kg 107.3 kg 104 kg   Body mass index is 36.99 kg/m.  Exam:  General: Alert and oriented x3, no acute distress HEENT: Normocephalic, atraumatic, mucous membranes are slightly dry, narrow airway Cardiovascular: Regular rate and rhythm, S1-S2 Respiratory: Decreased breath sounds bibasilar Abdomen: Soft, obese, nontender, positive bowel sounds Musculoskeletal: No clubbing or cyanosis, 1+ pitting edema from the knees down bilaterally, left greater than right Skin: Chronic venous stasis Psychiatry: Appropriate, no evidence of psychoses Neurology: Neurological exam appears overall normal with no focal deficits.  He has some nonspecific generalized weakness and in addition, had difficult time eliciting ankle reflexes however lower extremity edema may be causing this.  Data Reviewed: White blood cell count improved to 11.3.  Normal procalcitonin  Disposition:  Status is: Inpatient Remains inpatient appropriate because: Further diuresis, improvement in oxygenation, evaluation of myasthenia gravis    Anticipated discharge date: 7/3  Family Communication: Wife at the bedside DVT Prophylaxis:  apixaban [place]) tablet 5 mg    Author: Annita Brod ,MD 06/09/2022 3:42 PM  To reach On-call, see care teams to locate the attending and reach out via [email or web address]. Between 7PM-7AM, please contact night-coverage If you still have difficulty reaching the attending provider, please page the [facility] (Director on Call) for [facility] on amion for assistance.

## 2022-06-09 NOTE — Progress Notes (Signed)
RT note: NIF: -20 CmH2O FVC: 874m  Best effort from 3 attempts.

## 2022-06-09 NOTE — Consult Note (Addendum)
Neurology Consultation  Reason for Consult: c/f MG flare Requesting Physician: Gevena Barre  CC: diplopia and dyarthria  History is obtained from: Chart review, patient and wife   HPI: Joseph Macdonald is a 76 y.o. 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), heart failure with preserved EF, hypertension, smoking, COPD on home oxygen, hyperlipidemia, cardiomyopathy, heart failure with preserved EF, atrial fibrillation and Hx of bilateral PEs (2020) on Eliquis, acute on chronic respiratory failure, obesity (BMI 36.99), severe OSA nonadherent to CPAP, obesity hypoventilation syndrome  Notably he had a recent hospitalization (04/30/2022 - 05/18/2022) with cardiac arrest secondary to hypoxic respiratory failure, pneumonia, cardiomyopathy, acute on chronic systolic congestive heart failure, with profound weakness concerning for myasthenia flare treated with IVIG --initially myasthenia flare was felt to be lower on the differential but subsequently Dr. Cheral Marker noted "multiple findings on examination c/w at least mild myasthenia exacerbation including mild fatigable upgaze and ptosis, weak neck flex/ext, bulbar weakness, and inability to count beyond 5 on single breath exhale. He also reports diplopia" he was initially observed but due to worsening symptoms IVIG was initiated from 5/31 through 6/4, but unfortunately did not improve significantly with this treatment.  He was recently discharged from rehab (approximately 1 week prior to readmission), and went to his family doctor on 6/29 from where he was sent to the ED for having hypoxia (77% on room air), hypotension (94/53), noted to be lethargic, falling asleep all the time, unable to ambulate safely and with worsening myasthenia symptoms  Limited records are available as patient is following in the [place].  He was seen by Dr. Melrose Nakayama 12/31/2018 for consultation.  Notes from that visit indicate that the patient's  myasthenia gravis started in August 2016 with diplopia, dysphagia, slurred speech, ptosis and shortness of breath which was intermittent and short lasting but gradually worsening.  Back then he was stable on prednisone 40 mg daily, CellCept 1000 mg twice daily, and Mestinon 60 mg 3 times daily.  In January 2020 he did have plasma exchange for his myasthenia.   Patient reports he has been on prednisone doses as high as 60 mg daily and they have recently weaned him back down to 15 mg daily. He reports he did not tolerate cellcept and this was stopped 2 months ago due to hallucinations. Wife is unsure if he has taken CellCept. She is unsure if his symptoms are worse noting he has been extremely somnolent at home. She knows he is on pyridostigmine and prednisone. She is interested in newer medications she has heard advertised on TV.   Here the patient received 125 mg methylpred and is now receiving 80 mg daily for c/f COPD exacerbation and has been diuresed 1.5 L to date per primary team.    I am able to access a 11/14/2021 hospital summary that notes: "#questionable COPD flare He was admitted to the hospital in 07/2021 with documented COPD flare  secondary to a CAP. Most recent PFTs 02/2021 with FEV1/FVC 72% suggesting no  obstruction but with possible small airway obstruction. He saw pulmonary  08/2021 who felt noted that he has chronic hypoxic/hypercapneic respiratory  failure related to severe OSA, neuromuscular weakness from MG, obesity,  deconditioning, obesity hypoventilation syndrome. He was weaned off oxygen at  rest and underwent exertional O2 testing without any O2 needs. He has pulm  follow up in 12/2021 and he was continued on his home stiolto along with  education provided about proper use. Given hypoxia,  he was also treated with  5 days of prednisone.   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 NIFs were initially monitored, with  first set ~-20 (however likely poor seal) as repeat NIFs were consistently >  -40. He was continued on his home pyridostigmine 30 mg TID; he is on a short  course of higher dose pred and then will resume his prednisone 15.  - avoid MG exacerbating meds like ciprofloxacin or certain other antibiotics,  beta-blockers like propranolol, calcium channel blockers, Botox, muscle  relaxants, lithium, magnesium, verapamil"  ROS: All other review of systems was negative except as noted in the HPI, within limits of somnolence and mild confusion   Past Medical History:  Diagnosis Date   Arthritis    COPD (chronic obstructive pulmonary disease) ([place])    Diabetes mellitus without complication ([place])    diet controlled   Dyspnea    with activity   Hyperlipidemia    Hypertension    Myasthenia gravis ([place])    Oxygen deficit    Ventricular tachycardia [facility])      Past Surgical History:  Procedure Laterality Date   carpel tunnel sx     CATARACT EXTRACTION     right eye   CATARACT EXTRACTION W/PHACO Left 12/01/2020   Procedure: CATARACT EXTRACTION PHACO AND INTRAOCULAR LENS PLACEMENT (IOC) LEFT 10.83 01:14.1 14.6%;  Surgeon: Leandrew Koyanagi, MD;  Location: [facility];  Service: Ophthalmology;  Laterality: Left;   INTRAVASCULAR PRESSURE WIRE/FFR STUDY N/A 05/16/2022   Procedure: INTRAVASCULAR PRESSURE WIRE/FFR STUDY;  Surgeon: Nelva Bush, MD;  Location: [place] [facility];  Service: Cardiovascular;  Laterality: N/A;   RETINAL DETACHMENT SURGERY     RIGHT/LEFT HEART CATH AND CORONARY ANGIOGRAPHY N/A 05/16/2022   Procedure: RIGHT/LEFT HEART CATH AND CORONARY ANGIOGRAPHY;  Surgeon: Nelva Bush, MD;  Location: [place] [facility];  Service: Cardiovascular;  Laterality: N/A;   SKIN GRAFT     Behind left knee   TONSILLECTOMY AND ADENOIDECTOMY       Family History  Problem Relation Age of Onset   Hypertension Mother    Thyroid disease Mother    Kidney disease Mother    Arthritis Mother    Stroke Father    Heart disease Father 25   Arthritis Father    Heart attack Father    Arthritis Other    Current Outpatient Medications  Medication Instructions   albuterol (PROVENTIL HFA;VENTOLIN HFA) 108 (90 Base) MCG/ACT inhaler Inhalation, Every 6 hours PRN   apixaban (ELIQUIS) 5 mg, Oral, 2 times daily   budesonide-formoterol (SYMBICORT) 160-4.5 MCG/ACT inhaler 2 puffs, Inhalation, 2 times daily   cholecalciferol (VITAMIN D) 2,000 Units, Oral, Daily   diazepam (VALIUM) 5 mg, Oral, Every 6 hours PRN   digoxin (LANOXIN) 0.125 mg, Oral, Daily   empagliflozin (JARDIANCE) 25 mg, Oral, Daily   flecainide (TAMBOCOR) 50 mg, Oral, 2 times daily   furosemide (LASIX) 40 mg, Oral, 2 times daily   metoprolol succinate (TOPROL-XL) 50 mg, Oral, Daily, Take with or immediately following a meal.   OLANZapine zydis (ZYPREXA) 5 mg, Oral, 2 times daily   omeprazole (PRILOSEC) 40 mg, Oral, 2 times daily   pramipexole (MIRAPEX) 0.5 mg, Oral, Daily at bedtime   pravastatin (PRAVACHOL) 20 mg, Oral, Daily   predniSONE (DELTASONE) 20 mg, Oral, Daily with breakfast  pyridostigmine (MESTINON) 30 mg, Oral, 3 times daily   sacubitril-valsartan (ENTRESTO) 24-26 MG 1 tablet, Oral, 2 times daily   terbinafine (LAMISIL) 250 mg, Oral, Daily   tiotropium (SPIRIVA) 18 mcg, Inhalation, Daily   vitamin B-12 (CYANOCOBALAMIN) 1,000 mcg, Oral, Daily     Current Facility-Administered Medications:    acetaminophen (TYLENOL) tablet 650 mg, 650 mg, Oral, Q6H PRN, 650 mg at 06/09/22 1140 **OR** acetaminophen (TYLENOL) suppository 650 mg, 650 mg, Rectal, Q6H PRN, Mansy, Jan A, MD   apixaban Arne [facility]) tablet 5 mg, 5 mg, Oral, BID, Mansy, Jan A, MD, 5 mg at 06/09/22 0263   cefTRIAXone (ROCEPHIN) 1 g in sodium chloride 0.9 % 100 mL IVPB, 1 g, Intravenous, Q24H, Mansy, Jan A,  MD, Stopped at 06/08/22 2254   cholecalciferol (VITAMIN D) tablet 2,000 Units, 2,000 Units, Oral, Daily, Mansy, Jan A, MD, 2,000 Units at 06/09/22 7858   digoxin (LANOXIN) tablet 0.125 mg, 0.125 mg, Oral, Daily, Mansy, Jan A, MD   empagliflozin (JARDIANCE) tablet 25 mg, 25 mg, Oral, Daily, Mansy, Jan A, MD, 25 mg at 06/09/22 0827   furosemide (LASIX) injection 40 mg, 40 mg, Intravenous, Q12H, Mansy, Jan A, MD, 40 mg at 06/09/22 1719   HYDROcodone-acetaminophen (NORCO/VICODIN) 5-325 MG per tablet 1 tablet, 1 tablet, Oral, Q6H, Annita Brod, MD, 1 tablet at 06/09/22 1602   insulin aspart (novoLOG) injection 0-15 Units, 0-15 Units, Subcutaneous, TID AC & HS, Mansy, Jan A, MD, 5 Units at 06/09/22 1719   ipratropium-albuterol (DUONEB) 0.5-2.5 (3) MG/3ML nebulizer solution 3 mL, 3 mL, Nebulization, QID, Mansy, Jan A, MD, 3 mL at 06/09/22 1538   methylPREDNISolone sodium succinate (SOLU-MEDROL) 125 mg/2 mL injection 80 mg, 80 mg, Intravenous, Q24H, Mansy, Jan A, MD, 80 mg at 06/09/22 8502   metoprolol succinate (TOPROL-XL) 24 hr tablet 50 mg, 50 mg, Oral, Daily, Mansy, Jan A, MD   OLANZapine zydis (ZYPREXA) disintegrating tablet 5 mg, 5 mg, Oral, BID, Mansy, Jan A, MD, 5 mg at 06/09/22 0827   ondansetron (ZOFRAN) tablet 4 mg, 4 mg, Oral, Q6H PRN **OR** ondansetron (ZOFRAN) injection 4 mg, 4 mg, Intravenous, Q6H PRN, Mansy, Jan A, MD   pantoprazole (PROTONIX) EC tablet 40 mg, 40 mg, Oral, Daily, Mansy, Jan A, MD, 40 mg at 06/09/22 0825   pramipexole (MIRAPEX) tablet 0.5 mg, 0.5 mg, Oral, QHS, Mansy, Jan A, MD, 0.5 mg at 06/08/22 2250   pravastatin (PRAVACHOL) tablet 20 mg, 20 mg, Oral, Daily, Mansy, Jan A, MD, 20 mg at 06/09/22 7741   pyridostigmine (MESTINON) tablet 30 mg, 30 mg, Oral, TID, Mansy, Jan A, MD, 30 mg at 06/09/22 1603   sacubitril-valsartan (ENTRESTO) 24-26 mg per tablet, 1 tablet, Oral, BID, Mansy, Jan A, MD, 1 tablet at 06/09/22 2878   terbinafine (LAMISIL) tablet 250 mg, 250 mg, Oral,  Daily, Mansy, Jan A, MD, 250 mg at 06/09/22 0827   tiotropium [facility]) inhalation capsule ([facility] use ONLY) 18 mcg, 18 mcg, Inhalation, Daily, Mansy, Jan A, MD, 18 mcg at 06/09/22 0827   traMADol (ULTRAM) tablet 50 mg, 50 mg, Oral, Q6H PRN, Annita Brod, MD, 50 mg at 06/09/22 1244   traZODone (DESYREL) tablet 25 mg, 25 mg, Oral, QHS PRN, Mansy, Jan A, MD   vitamin B-12 (CYANOCOBALAMIN) tablet 1,000 mcg, 1,000 mcg, Oral, Daily, Mansy, Jan A, MD, 1,000 mcg at 06/09/22 6767   Social History:  reports that he quit smoking about 4 years ago. His smoking use included pipe and cigarettes. He has a  20.00 pack-year smoking history. He quit smokeless tobacco use about 4 years ago. He reports current alcohol use. He reports that he does not use drugs.   Exam: Current vital signs: BP 114/64 (BP Location: Right Arm)   Pulse 72   Temp 97.8 F (36.6 C)   Resp 19   Ht '5\' 6"'$  (1.676 m)   Wt 104 kg   SpO2 95%   BMI 36.99 kg/m  Vital signs in last 24 hours: Temp:  [97.8 F (36.6 C)-98.5 F (36.9 C)] 97.8 F (36.6 C) (06/30 1215) Pulse Rate:  [50-72] 72 (06/30 1215) Resp:  [11-27] 19 (06/30 1215) BP: (105-140)/(61-77) 114/64 (06/30 1215) SpO2:  [94 %-98 %] 95 % (06/30 1215) Weight:  [104 kg-107.3 kg] 104 kg (06/30 0105)   Physical Exam  Constitutional: Appears chronically ill  Psych: Affect appropriate to situation, calm and cooperative but very sleepy Eyes: No scleral injection HENT: No oropharyngeal obstruction. Nasal cannula in place  MSK: no joint deformities.  Cardiovascular: Perfusing extremities well Respiratory: Tachypenic  GI: Soft.  No distension. There is no tenderness.  Skin: Bruising of bilateral hands. Edema of bilateral legs, extensive.   Neuro: Mental Status: Falls asleep easily but able to converse briefly with examiner. Oriented to person, place, month, year, and situation. Patient is able to give a clear and coherent history by and large but incorrect on some details  such as time since discharge from the hospital No signs of aphasia or neglect  MG testing Ptosis time 8 seconds. Diplopia on right gaze more than left gaze.  Single breath test can count to 12  Neck flexion 4+/5 Mild to moderate dysarthria (quickly fatigues when speaking and has to pause to speak clearly)  Cranial Nerves: II: Visual Fields are full. Pupils are equal, round, and reactive to light.   III,IV, VI: EOM as in MG testing above.  V: Facial sensation is symmetric to temperature VII: Facial movement is symmetric.  VIII: hearing is intact to voice X: Uvula elevates symmetrically XI: Shoulder shrug is symmetric. XII: tongue is midline without atrophy or fasciculations.  Motor: 5/5 in the bilateral upper extremities. 4/5 hip flexion bilaterally, otherwise 4+/5 in the lower extremities  Sensory: Sensation is reduced in the LLE (chronic secondary to vein harvest for CABG per patient), otherwise intact in the remaining extremities to light touch Deep Tendon Reflexes: 2+ and symmetric in the brachioradialis and patellae on the right, difficult to elicit left brachioradialis and patella Cerebellar: FNF intact bilaterally  Gait:  Deferred   I have reviewed labs in epic and the results pertinent to this consultation are:  Basic Metabolic Panel: Recent Labs  Lab 06/08/22 1436 06/09/22 0455  NA 141 142  K 4.8 4.3  CL 98 98  CO2 33* 36*  GLUCOSE 169* 187*  BUN 20 21  CREATININE 0.96 0.84  CALCIUM 7.7* 7.6*    CBC: Recent Labs  Lab 06/08/22 1436 06/09/22 0455  WBC 13.0* 11.3*  HGB 13.6 13.3  HCT 47.2 45.4  MCV 102.8* 101.3*  PLT 194 158    Coagulation Studies: Recent Labs    06/08/22 1614  LABPROT 14.4  INR 1.1     Latest Reference Range & Units 06/08/22 22:29  Delivery systems  NASAL CANNULA  O2 Content L/min 4.0  pH, Arterial 7.35 - 7.45  7.35  pCO2 arterial 32 - 48 mmHg 75 (HH)  pO2, Arterial 83 - 108 mmHg 88  Acid-Base Excess 0.0 - 2.0 mmol/L 12.3  (H)  Bicarbonate 20.0 - 28.0 mmol/L 41.4 (H)  O2 Saturation % 97.8  Patient temperature  37.0  Collection site  RIGHT RADIAL  Allens test (pass/fail) PASS  PENDING  [facility]): Data is critically high (H): Data is abnormally high  Component Ref Range & Units 1 d ago 1 mo ago 10 mo ago 3 yr ago  B Natriuretic Peptide 0.0 - 100.0 pg/mL 714.7 High   971.6 High  CM  484.7 High  CM  144.0 High  CM    Procalcitonin < 0.10  UA with glucosuria, negative for infection Blood cultures negative to date   I have reviewed the images obtained  CXR 06/08/22 Cardiomegaly. There are no signs of pulmonary edema. There are linear densities in the lower lung fields suggesting subsegmental atelectasis/pneumonia. Small bilateral pleural effusions, more so on the right side.   ECHO 04/30/22  1. Left ventricular ejection fraction, by estimation, is 35 to 40%. The  left ventricle has moderately decreased function. The left ventricle  demonstrates global hypokinesis. There is mild left ventricular  hypertrophy. Left ventricular diastolic  parameters are consistent with Grade I diastolic dysfunction (impaired  relaxation).   2. Right ventricular systolic function is moderately reduced. The right  ventricular size is mildly enlarged.   3. The mitral valve is normal in structure. No evidence of mitral valve  regurgitation.   4. The aortic valve is grossly normal. Aortic valve regurgitation is not  visualized.   10/08/2015 EMG Conclusion:  This is a markedly abnormal study. There is electrodiagnostic evidence of a severe disorder of neuromuscular junction transmission, consistent with a diagnosis of myasthenia gravis. Conclusion:  This is a markedly abnormal study. There is electrodiagnostic evidence of a severe disorder of neuromuscular junction transmission, consistent with a diagnosis of myasthenia gravis.   Impression: Exam c/w acute exacerbation of MG (ptosis, diplopia, dysarthria, mild neck flexion  weakness). NIF as documented below very concerning (-20) as is patient's AGB with hypercarbia (obtained the night prior to my eval, with caveat of likely component of COPD / OSA and non adherence to CPAP/BiPAP refusal). As patient is refusing even CPAP/BiPAP due to his claustrophobia and is a DNR, I do think more aggressive treatment is warranted before he declines further. Etiology of flare unclear (no clear provoking meds given, no clear signs of infection, possibly due to de-escalation of immunotherapy if cellcept truly was discontinued recently). High dose steroids can contribute to MG exacerbation but given his baseline steroid exposure I think that is relatively less likely in this case and favor continuing the steroids ordered for possible COPD exacerbation at this time.   I favor PLEX for MG flare treatment rather than IVIG  in this patient given volume status, comorbidities and DNR code status; unfortunately this is not available at [facility] and he would need transfer to [facility] or [place]. I do expect the IVIG he received at the beginning of the month is likely wearing off (typically peak effect is 1-2 weeks after IVIG is given, and patients need for repeat treatment can vary between 3 weeks to several months); also it is hard to be sure how efficacious it was given history provided by family and patient and compounding variables of his cardiac and pulmonary comorbidities.   Recommendations: - NIF/FVC q4hr while awake  Initial this hospitalization  NIF: -61 CmH2O (historically last hospitalization  he did achieve a NIF in the high 30s) FVC: 823m - Avoid drugs below -- I have discontinued milk of magnesium. Consider weaning metoprolol  as this had previously been stopped for bradycardia per prior notes and he is intermittently brady to the 50s here (defer to primary team given his complex cardiac history) - Transfer for PLEX to be arranged by primary team - Continue steroids as ordered for acute COPD  flare, this may be of some benefit for MG as well. Would taper steroids slowly when COPD exacerbation course is completed - Increasing pyridostigmine to 60 mg TID for symptomatic relief   - Will need to d/c Eliquis and transition to heparin drip to allow PLEX catheter placement (heparin will have to be paused for catheter); pharmacy consult placed  - Neurology will continue to follow while patient is awaiting transfer and Neurology will follow along once patient transfers to [facility]  Drugs to avoid if you have Myasthenia Gravis:   Many drugs have been reported to have adverse effects in patients with MG (see below). However, not all patients react adversely to all these drugs. Conversely, not all "safe" drugs can be used with impunity in patients with MG.As a rule, the listed drugs should be avoided whenever possible, and patients with MG should be followed closely when any new drug is introduced.  Drugs that may exacerbate MG  Antibiotics  Aminoglycosides: e.g., streptomycin, tobramycin, kanamycin  Quinolones: e.g., ciprofloxacin, levofloxacin, ofloxacin, gatifloxacin  Macrolides: e.g., erythromycin, azithromycin, telithromycin  Nondepolarizing muscle relaxants for surgery  d-Tubocurarine (curare), pancuronium, vecuronium, atracurium  Beta-blocking agents  Propranolol, atenolol, metoprolol  Local anesthetics and related agents  Procaine, Xylocaine in large amounts  Procainamide (for arrhythmias)  Botulinum toxin  Botox exacerbates weakness.  Quinine derivatives  Quinine, quinidine, chloroquine, mefloquine (Lariam)  Magnesium  Decreases ACh release  Penicillamine  May cause MG  Drugs with important interactions in MG  Cyclosporine  Broad range of drug interactions, which may raise or lower cyclosporine levels  Azathioprine  Avoid allopurinol; combination may result in myelosuppression.   Lesleigh Noe MD-PhD [facility] [phone] [facility] coverage for  [facility] is from 8 AM to 4 AM in-house and 4 PM to 8 PM by telephone/video. 8 PM to 8 AM emergent questions or overnight urgent questions should be addressed to Teleneurology On-call or [facility] neurohospitalist; contact information can be found on AMION

## 2022-06-09 NOTE — Progress Notes (Signed)
SLP Cancellation Note  Patient Details Name: Joseph Macdonald MRN: [id number] DOB: Mar 07, 1946   Cancelled treatment:       Reason Eval/Treat Not Completed: Fatigue/lethargy limiting ability to participate (chart reviewed; met w/ Wife in room - thorough education re: rec'd diet consistency and general aspiration precautions) Pt lethargic/drowsy post recent meds; unable to awaken sufficiently for participation in BSE. Will f/u in the morning to complete evaluation.  Thorough education provided to the Wife (present) on the rec'd diet consistency (more soft, cohesive foods moistened well) and general aspiration precautions in setting of MG and multiple comorbidities including elevated CO2 and HRrEF. Wife endorsed pt was "very weak" when he returned home from [facility] and that he had been using a Wheelchair at the SNF during his Rehab which he did not use at home, and he just "gave out". She also endorsed pt has "pain" in one knee.  We discussed his swallowing since D/C from SNF and this admit; Wife denied any difficulty swallowing liquids - No overt coughing or choking when drinking. Will modify his regular consistency diet for ease of mastication w/ meats and provide more gravies on foods for ease of mastication/swallowing in setting of the MG. Wife agreed.  ST services will f/u in AM. MD updated.      Orinda Kenner, MS, CCC-SLP Speech Language Pathologist Rehab Services; [place] [phone] (ascom) Ileane Sando 06/09/2022, 3:15 PM

## 2022-06-09 NOTE — Progress Notes (Signed)
NIF -20 cmH2O FVC 620 ml Best of 3 maneuvers, PT very sleepy/drowsy. Poor effort.

## 2022-06-09 NOTE — Assessment & Plan Note (Addendum)
Initially recommended BiPAP though the patient refused. Is normally on 2 L at night and is currently on 4 L.  Secondary to both COPD and CHF.

## 2022-06-09 NOTE — Hospital Course (Addendum)
76 year old male with past medical history of COPD, diabetes mellitus, diastolic heart failure, paroxysmal A-fib and pulmonary embolus presented to the emergency room on 6/29 with complaints of cough and shortness of breath x4 days.  Patient had a recent hospitalization and was discharged on 6/8 for cardiac arrest for which she was intubated and then treated for heart failure and then discharged to skilled nursing.  Patient had just been discharged from skilled nursing and been at home for a week.  Patient found to be hypoxic and was admitted to the hospitalist service for CHF and COPD exacerbation.  Following admission, patient reported issues with leg weakness and speech difficulty and possibly swallowing, concerning for myasthenia flare.  During recent hospitalization, patient was treated with IVIG.  Neurology consulted and given findings of hypercarbia as well as diplopia and ptosis plus NIF of -20, initial plan was for transfer to [facility] for plasma exchange.  Due to bed availability, patient unable to be transferred on 6/30.  In the meantime, received increased dose of pyridostigmine.  Weakness symptoms improved.

## 2022-06-09 NOTE — Progress Notes (Signed)
Pts HR 51- asymptomatic / MD aware/ orders to hold am dose of PO dig and metoprolol - will monitor.

## 2022-06-09 NOTE — Assessment & Plan Note (Addendum)
-   We will continue steroid therapy with IV Solu-Medrol and bronchodilator therapy with DuoNebs 4 times daily and every 4 hours as needed as well as antibiotic therapy with IV Rocephin. - We will hold off Symbicort.

## 2022-06-09 NOTE — Assessment & Plan Note (Addendum)
-   He had a recent 2D echo that revealed an EF of 35 to 40% and grade 1 diastolic dysfunction on 1/89/8421. To date has diuresed over 5 L and is -4 L deficient. - We will continue Entresto and Toprol-XL although held this morning due to softer blood pressures.

## 2022-06-09 NOTE — TOC Initial Note (Addendum)
Transition of Care [facility]) - Initial/Assessment Note    Patient Details  Name: Joseph Macdonald MRN: [id number] Date of Birth: 12-11-1946  Transition of Care [facility]) CM/SW Contact:    Laurena Slimmer, RN Phone Number: 06/09/2022, 12:54 PM  Clinical Narrative:                 Spoke with patient and family at bedside. Patient admitted from home 6/29. He recently discharged from [facility]. Patient plan is to return home. Patient was set up with [facility] prior to discharging from [facility]. Spoke with Estill Bamberg at [employer] Kateri Plummer will be covering this weekend for [employer]. Velna Hatchet can be contacted at [phone]. Patient is on active services for RN, PT.   Admitted for: CHF Admitted from: Home PCP: Dr. Lelon Huh Pharmacy:[facility], [address] Current home health/prior home health/DME: Kasandra Knudsen, Radiation protection practitioner, RW Transportation:Wife           Patient Goals and CMS Choice        Expected Discharge Plan and Services                                                Prior Living Arrangements/Services                       Activities of Daily Living      Permission Sought/Granted                  Emotional Assessment              Admission diagnosis:  COPD exacerbation ([place]) [J44.1] Acute CHF (congestive heart failure) ([place]) [I50.9] Acute on chronic combined systolic and diastolic congestive heart failure (HCC) [I50.43] Acute respiratory failure with hypoxia ([place]) [J96.01] Patient Active Problem List   Diagnosis Date Noted   COPD with acute exacerbation ([place]) 06/09/2022   Acute respiratory failure with hypoxia and hypercarbia ([place]) 06/09/2022   Dyslipidemia 06/09/2022   Myasthenia gravis ([place]) 06/09/2022   Hypoxia 06/08/2022   Acute CHF (congestive heart failure) ([place]) 06/08/2022   Morbid obesity ([place]) 05/10/2022   Pressure injury of skin 05/09/2022   Acute on chronic HFrEF (heart failure with reduced  ejection fraction) ([place])    Hypertensive emergency    Palliative care by specialist    Goals of care, counseling/discussion    DNR (do not resuscitate)    Cardiomyopathy ([place])    Cardiac arrest due to respiratory disorder ([place]) 04/30/2022   Paroxysmal atrial fibrillation (HCC)    HFrEF (heart failure with reduced ejection fraction) ([place])    History of pulmonary embolism 07/02/2020   Vitamin D deficiency 06/17/2019   Acute on chronic kidney failure ([place]) 06/11/2019   Shortness of breath 04/11/2019   Epigastric pain 04/11/2019   Hypokalemia 04/11/2019   Ventricular tachycardia ([place]) 03/12/2019   Chronic heart failure with preserved ejection fraction (HFpEF) ([place]) 03/12/2019   Chest pain 03/12/2019   COPD (chronic obstructive pulmonary disease) ([place]) 01/13/2019   Pre-diabetes 03/19/2017   Chronic narcotic use 01/17/2016   Generalized weakness 09/17/2015   Anxiety 08/17/2015   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/17/2015   Narrowing of intervertebral disc space 08/17/2015   Clinical depression 08/17/2015   Acid reflux 08/17/2015   Hemorrhoid 08/17/2015   Essential hypertension 08/17/2015   Bad memory 08/17/2015   Neurosis, posttraumatic 08/17/2015   Allergic rhinitis,  seasonal 08/17/2015   Smoking greater than 30 pack years 08/17/2015   Restless leg syndrome 08/17/2015   PCP:  [name], MD Pharmacy:   [facility] [id number] - [place], [place] - [address] [place] [address] Phone: [phone] Fax: [phone]  [place], [place] [address] Phone: [phone] Fax: [phone]     Social Determinants of Health (SDOH) Interventions    Readmission Risk Interventions    06/09/2022   12:53 PM  Readmission Risk Prevention Plan  Transportation Screening Complete  Medication Review (RN Care Manager) Complete  PCP or Specialist appointment within 3-5 days of discharge Complete  Skilled [place] Complete

## 2022-06-09 NOTE — Consult Note (Signed)
   Heart Failure Nurse Navigator Note  HFrEF 35 to 40%.  Mild LVH.  Grade 1 diastolic dysfunction.  Right ventricular systolic function mildly reduced.  He presented to the emergency room with complaints of shortness of breath, orthopnea, PND, paroxysmal atrial fibrillation and lower extremity edema.  Comorbidities:  Left bundle branch block Arthritis COPD Diabetes Hyperlipidemia Hypertension Myasthenia gravis Ventricular tachycardia Paroxysmal atrial fibrillation  History of pulmonary embolus  Medications:  Apixaban 5 mg 2 times a day Digoxin 0.125 mg daily Jardiance 25 mg daily Furosemide 40 mg IV every 12 Metoprolol succinate 50 mg p.o. daily Pravastatin 20 mg daily Entresto 24/26 6 mg 2 times a day  Labs:  Sodium 142, potassium 4.3, chloride 98, CO2 36, BUN 21, creatinine 0.84, GFR greater than 60, hemoglobin 13.3, hematocrit 45.4, BNP on admission was 714 Weight 204 kg Blood pressure 114/64 Intake 240 mL Output 100 mL   Initial meeting with patient and his wife [name] who are at the bedside.  Discussed his diet, she states that they very seldom eat out at restaurants.  She tries to fix low sodium and healthier foods.  He does not weigh himself on a daily basis and, explained the rationale be behind daily weights and reporting a 3 pound weight gain overnight or 5 pounds within the week.  Also important to report changes in symptoms such as increasing shortness of breath, lower extremity edema etc.  Discussed the importance of sticking with the 64 ounce fluid restriction daily.  Also discussed being compliant with his medications.  Made them aware that he is a [facility] patient and that he qualifies for free program of monitoring his blood pressure, weights and heart rhythm at home.  They were given information flyer about the ventricle health program.  They state that they are very interested and would like to go ahead and get enrolled.  Application sent to the  company.  They had no further questions.  He was given the living with heart failure teaching booklet, zone magnet, info on low-sodium and weight chart.  Pricilla Riffle RN CHFN

## 2022-06-09 NOTE — Assessment & Plan Note (Signed)
Patient normally on Percocet 5/325 every 6 hours scheduled.  Initially this was not on his med rec as wife forgot to mention it and he gets this medicine from a different pharmacy than the [place]. Restarted.Marland Kitchen

## 2022-06-09 NOTE — Progress Notes (Signed)
ANTICOAGULATION CONSULT NOTE - Initial Consult  Pharmacy Consult for heparin drip Indication:  transition off apixaban for procedure /hx PE/afib  Allergies  Allergen Reactions   Lisinopril Swelling    Patient Measurements: Height: '5\' 6"'$  (167.6 cm) Weight: 104 kg (229 lb 3.2 oz) IBW/kg (Calculated) : 63.8 Heparin Dosing Weight: 87 kg  Vital Signs: Temp: 98.4 F (36.9 C) (06/30 2000) Temp Source: Oral (06/30 1632) BP: 113/56 (06/30 2000) Pulse Rate: 68 (06/30 2000)  Labs: Recent Labs    06/08/22 1436 06/08/22 1614 06/08/22 2153 06/09/22 0455 06/09/22 1944  HGB 13.6  --   --  13.3  --   HCT 47.2  --   --  45.4  --   PLT 194  --   --  158  --   APTT  --  30  --   --  28  LABPROT  --  14.4  --   --   --   INR  --  1.1  --   --   --   HEPARINUNFRC  --   --   --   --  0.92*  CREATININE 0.96  --   --  0.84  --   TROPONINIHS  --  19* 16  --   --     Estimated Creatinine Clearance: 85.9 mL/min (by C-G formula based on SCr of 0.84 mg/dL).   Medical History: Past Medical History:  Diagnosis Date   Arthritis    COPD (chronic obstructive pulmonary disease) ([place])    Diabetes mellitus without complication (HCC)    diet controlled   Dyspnea    with activity   Hyperlipidemia    Hypertension    Myasthenia gravis ([place])    Oxygen deficit    Ventricular tachycardia (HCC)     Medications:  Scheduled:   cholecalciferol  2,000 Units Oral Daily   digoxin  0.125 mg Oral Daily   empagliflozin  25 mg Oral Daily   furosemide  40 mg Intravenous Q12H   HYDROcodone-acetaminophen  1 tablet Oral Q6H   insulin aspart  0-15 Units Subcutaneous TID AC & HS   ipratropium-albuterol  3 mL Nebulization QID   methylPREDNISolone (SOLU-MEDROL) injection  80 mg Intravenous Q24H   metoprolol succinate  50 mg Oral Daily   OLANZapine zydis  5 mg Oral BID   pantoprazole  40 mg Oral Daily   pramipexole  0.5 mg Oral QHS   pravastatin  20 mg Oral Daily   pyridostigmine  60 mg Oral TID    sacubitril-valsartan  1 tablet Oral BID   terbinafine  250 mg Oral Daily   tiotropium  18 mcg Inhalation Daily   vitamin B-12  1,000 mcg Oral Daily   Infusions:   cefTRIAXone (ROCEPHIN)  IV Stopped (06/08/22 2254)   heparin     Last dose of apixaban 06/09/22 @ 0826  Assessment: 76 yo male on apixaban PTA for hx PE./afib. TRansition to heparin drip to facilitate placement of PLEX catheter for MG flare Hgb 13.3  plt 158  aPTT 28  HL=0.92   Goal of Therapy:  Heparin level 0.3-0.7 units/ml aPTT 66-102 seconds Monitor platelets by anticoagulation protocol: Yes   Plan:  Will order Heparin drip 1200 units/hr, no bolus. Heparin level and aPTT are not correlating therefore will follow aPTT levels until they do. -aPTT to be evaluated in 8 hrs -CBC daily  Ariyanna Oien A 06/09/2022,8:58 PM

## 2022-06-09 NOTE — Assessment & Plan Note (Signed)
-   We will continue Eliquis, Toprol-XL and digoxin.

## 2022-06-09 NOTE — Assessment & Plan Note (Addendum)
Following admission, patient reported issues with leg weakness and speech difficulty and possibly swallowing, concerning for myasthenia flare.  During recent hospitalization, patient was treated with IVIG.  Neurology consulted and given findings of hypercarbia as well as diplopia and ptosis plus NIF of -20, and initial plan was for transfer to facility for plasma exchange.  This was delayed due to bed availability.  However, patient responded to increased dosing of pyridostigmine and now doing much better.  Appreciate neurology help.  Continue pyridostigmine at higher dose.  Slow prednisone taper.  Continue following NIF/FVC every 4 hours.  Avoid medications that could make myasthenia worse including beta-blocker.

## 2022-06-09 NOTE — Assessment & Plan Note (Signed)
Meets criteria BMI greater than 35 & comorbidities of hypertension and heart failure

## 2022-06-10 DIAGNOSIS — G7000 Myasthenia gravis without (acute) exacerbation: Secondary | ICD-10-CM | POA: Diagnosis not present

## 2022-06-10 DIAGNOSIS — J441 Chronic obstructive pulmonary disease with (acute) exacerbation: Secondary | ICD-10-CM | POA: Diagnosis not present

## 2022-06-10 DIAGNOSIS — I5043 Acute on chronic combined systolic (congestive) and diastolic (congestive) heart failure: Secondary | ICD-10-CM | POA: Diagnosis not present

## 2022-06-10 DIAGNOSIS — G894 Chronic pain syndrome: Secondary | ICD-10-CM

## 2022-06-10 DIAGNOSIS — J449 Chronic obstructive pulmonary disease, unspecified: Secondary | ICD-10-CM

## 2022-06-10 DIAGNOSIS — J9601 Acute respiratory failure with hypoxia: Secondary | ICD-10-CM | POA: Diagnosis not present

## 2022-06-10 LAB — CBC
HCT: 42.5 % (ref 39.0–52.0)
Hemoglobin: 12.5 g/dL — ABNORMAL LOW (ref 13.0–17.0)
MCH: 29.6 pg (ref 26.0–34.0)
MCHC: 29.4 g/dL — ABNORMAL LOW (ref 30.0–36.0)
MCV: 100.5 fL — ABNORMAL HIGH (ref 80.0–100.0)
Platelets: 166 10*3/uL (ref 150–400)
RBC: 4.23 MIL/uL (ref 4.22–5.81)
RDW: 15.4 % (ref 11.5–15.5)
WBC: 14.7 10*3/uL — ABNORMAL HIGH (ref 4.0–10.5)
nRBC: 0 % (ref 0.0–0.2)

## 2022-06-10 LAB — HEPARIN LEVEL (UNFRACTIONATED): Heparin Unfractionated: 0.57 IU/mL (ref 0.30–0.70)

## 2022-06-10 LAB — GLUCOSE, CAPILLARY
Glucose-Capillary: 135 mg/dL — ABNORMAL HIGH (ref 70–99)
Glucose-Capillary: 177 mg/dL — ABNORMAL HIGH (ref 70–99)
Glucose-Capillary: 375 mg/dL — ABNORMAL HIGH (ref 70–99)
Glucose-Capillary: 116 mg/dL — ABNORMAL HIGH (ref 70–99)

## 2022-06-10 LAB — VITAMIN B12: Vitamin B-12: 576 pg/mL (ref 180–914)

## 2022-06-10 LAB — APTT
aPTT: 47 seconds — ABNORMAL HIGH (ref 24–36)
aPTT: 55 seconds — ABNORMAL HIGH (ref 24–36)
aPTT: 98 seconds — ABNORMAL HIGH (ref 24–36)

## 2022-06-10 LAB — PROCALCITONIN: Procalcitonin: <0.1 ng/mL

## 2022-06-10 LAB — FOLATE: Folate: 8.6 ng/mL (ref >5.9)

## 2022-06-10 MED ORDER — HEPARIN BOLUS VIA INFUSION
2600.0000 [IU] | Freq: Once | INTRAVENOUS | Status: AC
  Administered 2022-06-10: 2600 [IU] via INTRAVENOUS
  Filled 2022-06-10: qty 2600

## 2022-06-10 NOTE — Progress Notes (Signed)
[facility] for heparin drip Indication:  transition off apixaban for procedure /hx PE/afib  Allergies  Allergen Reactions   Lisinopril Swelling    Patient Measurements: Height: '5\' 6"'$  (167.6 cm) Weight: 102.4 kg (225 lb 11.2 oz) IBW/kg (Calculated) : 63.8 Heparin Dosing Weight: 87 kg  Vital Signs: Temp: 97.9 F (36.6 C) (07/01 1210) Temp Source: Oral (07/01 1210) BP: 120/72 (07/01 1210) Pulse Rate: 61 (07/01 1210)  Labs: Recent Labs    06/08/22 1436 06/08/22 1436 06/08/22 1614 06/08/22 2153 06/09/22 0455 06/09/22 1944 06/10/22 0519 06/10/22 1442  HGB 13.6  --   --   --  13.3  --  12.5*  --   HCT 47.2  --   --   --  45.4  --  42.5  --   PLT 194  --   --   --  158  --  166  --   APTT  --    < > 30  --   --  28 47* 55*  LABPROT  --   --  14.4  --   --   --   --   --   INR  --   --  1.1  --   --   --   --   --   HEPARINUNFRC  --   --   --   --   --  0.92*  --  0.57  CREATININE 0.96  --   --   --  0.84  --   --   --   TROPONINIHS  --   --  19* 16  --   --   --   --    < > = values in this interval not displayed.     Estimated Creatinine Clearance: 85.1 mL/min (by C-G formula based on SCr of 0.84 mg/dL).   Medical History: Past Medical History:  Diagnosis Date   Arthritis    COPD (chronic obstructive pulmonary disease) ([place])    Diabetes mellitus without complication (HCC)    diet controlled   Dyspnea    with activity   Hyperlipidemia    Hypertension    Myasthenia gravis ([place])    Oxygen deficit    Ventricular tachycardia (HCC)     Medications:  Scheduled:   cholecalciferol  2,000 Units Oral Daily   digoxin  0.125 mg Oral Daily   empagliflozin  25 mg Oral Daily   furosemide  40 mg Intravenous Q12H   HYDROcodone-acetaminophen  1 tablet Oral Q6H   insulin aspart  0-15 Units Subcutaneous TID AC & HS   ipratropium-albuterol  3 mL Nebulization QID   methylPREDNISolone (SOLU-MEDROL) injection  80 mg Intravenous Q24H    metoprolol succinate  50 mg Oral Daily   OLANZapine zydis  5 mg Oral BID   pantoprazole  40 mg Oral Daily   pramipexole  0.5 mg Oral QHS   pravastatin  20 mg Oral Daily   pyridostigmine  60 mg Oral TID   sacubitril-valsartan  1 tablet Oral BID   terbinafine  250 mg Oral Daily   tiotropium  18 mcg Inhalation Daily   vitamin B-12  1,000 mcg Oral Daily   Infusions:   cefTRIAXone (ROCEPHIN)  IV 1 g (06/09/22 2136)   heparin 1,400 Units/hr (06/10/22 1446)   Last dose of apixaban 06/09/22 @ 0826  Assessment: 76 yo male on apixaban PTA for hx PE./afib. TRansition to heparin drip to facilitate placement of PLEX  catheter for MG flare Hgb 13.3  plt 158  aPTT 28  HL=0.92   Goal of Therapy:  Heparin level 0.3-0.7 units/ml aPTT 66-102 seconds Monitor platelets by anticoagulation protocol: Yes  7/01 0519 aPTT 47, subtherapeutic 7/01 1442 aPTT 55, HL=0.57,  subtherapeutic, not correlating,will increase from 1400 units/hr to 1650 units/hr    Plan:   7/01 1442 aPTT 55, HL=0.57,  subtherapeutic, not correlating, Bolus 2600 units x 1 will increase from 1400 units/hr to 1650 units/hr Heparin level and aPTT are not correlating therefore will follow aPTT levels until they do. -Recheck aPTT in 8 hrs after rate change -CBC daily  Chinita Greenland PharmD Clinical Pharmacist 06/10/2022

## 2022-06-10 NOTE — Progress Notes (Signed)
ANTICOAGULATION CONSULT NOTE   Pharmacy Consult for heparin drip Indication:  transition off apixaban for procedure /hx PE/afib  Allergies  Allergen Reactions   Lisinopril Swelling    Patient Measurements: Height: '5\' 6"'$  (167.6 cm) Weight: 102.4 kg (225 lb 11.2 oz) IBW/kg (Calculated) : 63.8 Heparin Dosing Weight: 87 kg  Vital Signs: Temp: 97.8 F (36.6 C) (07/01 0426) Temp Source: Oral (07/01 0426) BP: 129/60 (07/01 0426) Pulse Rate: 65 (07/01 0426)  Labs: Recent Labs    06/08/22 1436 06/08/22 1614 06/08/22 2153 06/09/22 0455 06/09/22 1944 06/10/22 0519  HGB 13.6  --   --  13.3  --  12.5*  HCT 47.2  --   --  45.4  --  42.5  PLT 194  --   --  158  --  166  APTT  --  30  --   --  28 47*  LABPROT  --  14.4  --   --   --   --   INR  --  1.1  --   --   --   --   HEPARINUNFRC  --   --   --   --  0.92*  --   CREATININE 0.96  --   --  0.84  --   --   TROPONINIHS  --  19* 16  --   --   --      Estimated Creatinine Clearance: 85.1 mL/min (by C-G formula based on SCr of 0.84 mg/dL).   Medical History: Past Medical History:  Diagnosis Date   Arthritis    COPD (chronic obstructive pulmonary disease) ([place])    Diabetes mellitus without complication (HCC)    diet controlled   Dyspnea    with activity   Hyperlipidemia    Hypertension    Myasthenia gravis ([place])    Oxygen deficit    Ventricular tachycardia (HCC)     Medications:  Scheduled:   cholecalciferol  2,000 Units Oral Daily   digoxin  0.125 mg Oral Daily   empagliflozin  25 mg Oral Daily   furosemide  40 mg Intravenous Q12H   HYDROcodone-acetaminophen  1 tablet Oral Q6H   insulin aspart  0-15 Units Subcutaneous TID AC & HS   ipratropium-albuterol  3 mL Nebulization QID   methylPREDNISolone (SOLU-MEDROL) injection  80 mg Intravenous Q24H   metoprolol succinate  50 mg Oral Daily   OLANZapine zydis  5 mg Oral BID   pantoprazole  40 mg Oral Daily   pramipexole  0.5 mg Oral QHS   pravastatin  20 mg Oral  Daily   pyridostigmine  60 mg Oral TID   sacubitril-valsartan  1 tablet Oral BID   terbinafine  250 mg Oral Daily   tiotropium  18 mcg Inhalation Daily   vitamin B-12  1,000 mcg Oral Daily   Infusions:   cefTRIAXone (ROCEPHIN)  IV 1 g (06/09/22 2136)   heparin 1,200 Units/hr (06/09/22 2136)   Last dose of apixaban 06/09/22 @ 0826  Assessment: 76 yo male on apixaban PTA for hx PE./afib. TRansition to heparin drip to facilitate placement of PLEX catheter for MG flare Hgb 13.3  plt 158  aPTT 28  HL=0.92   Goal of Therapy:  Heparin level 0.3-0.7 units/ml aPTT 66-102 seconds Monitor platelets by anticoagulation protocol: Yes  7/01 0519 aPTT 47, subtherapeutic    Plan:  Bolus 2600 units x 1 Increase heparin drip to 1400 units/hr Heparin level and aPTT are not correlating therefore will follow aPTT  levels until they do. -Recheck aPTT and HL in 8 hrs after rate change -CBC daily  Renda Rolls, PharmD, [facility] 06/10/2022 6:18 AM

## 2022-06-10 NOTE — Progress Notes (Signed)
ANTICOAGULATION CONSULT NOTE   Pharmacy Consult for heparin drip Indication:  transition off apixaban for procedure /hx PE/afib  Allergies  Allergen Reactions   Lisinopril Swelling    Patient Measurements: Height: '5\' 6"'$  (167.6 cm) Weight: 102.4 kg (225 lb 11.2 oz) IBW/kg (Calculated) : 63.8 Heparin Dosing Weight: 87 kg  Vital Signs: Temp: 98.2 F (36.8 C) (07/01 1951) Temp Source: Oral (07/01 1210) BP: 135/62 (07/01 1951) Pulse Rate: 57 (07/01 1951)  Labs: Recent Labs    06/08/22 1436 06/08/22 1436 06/08/22 1614 06/08/22 2153 06/09/22 0455 06/09/22 1944 06/10/22 0519 06/10/22 1442 06/10/22 2209  HGB 13.6  --   --   --  13.3  --  12.5*  --   --   HCT 47.2  --   --   --  45.4  --  42.5  --   --   PLT 194  --   --   --  158  --  166  --   --   APTT  --    < > 30  --   --  28 47* 55* 98*  LABPROT  --   --  14.4  --   --   --   --   --   --   INR  --   --  1.1  --   --   --   --   --   --   HEPARINUNFRC  --   --   --   --   --  0.92*  --  0.57  --   CREATININE 0.96  --   --   --  0.84  --   --   --   --   TROPONINIHS  --   --  19* 16  --   --   --   --   --    < > = values in this interval not displayed.     Estimated Creatinine Clearance: 85.1 mL/min (by C-G formula based on SCr of 0.84 mg/dL).   Medical History: Past Medical History:  Diagnosis Date   Arthritis    COPD (chronic obstructive pulmonary disease) ([place])    Diabetes mellitus without complication (HCC)    diet controlled   Dyspnea    with activity   Hyperlipidemia    Hypertension    Myasthenia gravis ([place])    Oxygen deficit    Ventricular tachycardia (HCC)     Medications:  Scheduled:   cholecalciferol  2,000 Units Oral Daily   digoxin  0.125 mg Oral Daily   empagliflozin  25 mg Oral Daily   furosemide  40 mg Intravenous Q12H   HYDROcodone-acetaminophen  1 tablet Oral Q6H   insulin aspart  0-15 Units Subcutaneous TID AC & HS   ipratropium-albuterol  3 mL Nebulization QID    methylPREDNISolone (SOLU-MEDROL) injection  80 mg Intravenous Q24H   metoprolol succinate  50 mg Oral Daily   OLANZapine zydis  5 mg Oral BID   pantoprazole  40 mg Oral Daily   pramipexole  0.5 mg Oral QHS   pravastatin  20 mg Oral Daily   pyridostigmine  60 mg Oral TID   sacubitril-valsartan  1 tablet Oral BID   terbinafine  250 mg Oral Daily   tiotropium  18 mcg Inhalation Daily   vitamin B-12  1,000 mcg Oral Daily   Infusions:   cefTRIAXone (ROCEPHIN)  IV 1 g (06/10/22 2059)   heparin 1,650 Units/hr (06/10/22 1537)  Last dose of apixaban 06/09/22 @ 0826  Assessment: 76 yo male on apixaban PTA for hx PE./afib. TRansition to heparin drip to facilitate placement of PLEX catheter for MG flare Hgb 13.3  plt 158  aPTT 28  HL=0.92   Goal of Therapy:  Heparin level 0.3-0.7 units/ml aPTT 66-102 seconds Monitor platelets by anticoagulation protocol: Yes  7/01 0519 aPTT 47, subtherapeutic 7/01 1442 aPTT 55, HL=0.57,  subtherapeutic, not correlating,will increase from 1400 units/hr to 1650 units/hr 7/01 2209 aPTT 98, therapeutic x 1    Plan:  -Continue heparin infusion at 1650 units/hr -Heparin level and aPTT are not correlating therefore will follow aPTT levels until they do. -Recheck aPTT w/ AM labs to confirm -HL & CBC daily  Renda Rolls, PharmD, [facility] 06/10/2022 11:37 PM

## 2022-06-10 NOTE — Progress Notes (Signed)
Neurology Progress Note  Patient ID: Joseph Macdonald is a 76 y.o. with PMHx of  has a past medical history of Arthritis, COPD (chronic obstructive pulmonary disease) ([place]), Diabetes mellitus without complication ([place]), Dyspnea, Hyperlipidemia, Hypertension, Myasthenia gravis ([place]), Oxygen deficit, and Ventricular tachycardia ([place]).   Subjective: - Feeling much better this morning, diplopia, respiratory status, fatigue and generalized weakness as well as dysarthria all improving - Wife confirms that even of the patient always does a little bit better in the morning, compared to other recent mornings this is the best she has seen him - No new complaints   Exam: Vitals:   06/10/22 0426 06/10/22 0727  BP: 129/60 128/84  Pulse: 65 (!) 56  Resp: 19 18  Temp: 97.8 F (36.6 C) 98 F (36.7 C)  SpO2: 97% 97%   Gen: In recliner, comfortable and much improved general appearance  Resp: non-labored breathing, no grossly audible wheezing Cardiac: Perfusing extremities well  Abd: soft, nt  Neuro: MS: Awake, alert, briskly following commands, oriented to person, place and situation MG focused testing Minimal ptosis on my examination this morning, able to maintain upgaze for over 60 seconds but he does start to develop some diplopia and repeatedly distracts away from looking up and looks at me. Diplopia on right gaze has resolved and he continues to have very minimal diplopia on left gaze Single breath test can count to 16 Neck flexion 5/5, arm abduction 5/5 bilaterally, hip flexion 5/5 bilateral No longer has dysarthria    6/30 4 PM  6/30 9 PM (poor effort)     NIF  - 20   -20     FVC  870   620       Pertinent Labs:  Basic Metabolic Panel: Recent Labs  Lab 06/08/22 1436 06/09/22 0455  NA 141 142  K 4.8 4.3  CL 98 98  CO2 33* 36*  GLUCOSE 169* 187*  BUN 20 21  CREATININE 0.96 0.84  CALCIUM 7.7* 7.6*    CBC: Recent Labs  Lab 06/08/22 1436 06/09/22 0455 06/10/22 0519  WBC  13.0* 11.3* 14.7*  HGB 13.6 13.3 12.5*  HCT 47.2 45.4 42.5  MCV 102.8* 101.3* 100.5*  PLT 194 158 166    Coagulation Studies: Recent Labs    06/08/22 1614  LABPROT 14.4  INR 1.1      Impression: Patient is improving dramatically with increase in pyridostigmine in combination with excellent management of his heart failure and respiratory status by primary team.  No longer meets criteria for transfer but I will continue to monitor closely  Recommendations:  # MG - Continue to avoid drugs that exacerbate MG listed below  - Discussed with primary team, please reach out to cardiology to ask if metoprolol may be discontinued - Continue pyridostigmine 60 mg 3 times daily, up from prior dose of 30 mg 3 times daily - Slow taper of prednisone once COPD exacerbation treatment complete, taper to 60 mg daily for 1 week, then 50 mg daily for 1 week, then 40 mg daily for 1 week and then 30 mg daily and hold until neurology follow-up.  This may be adjusted by outpatient neurologist if patient is seen sooner - Continue NIF/FVC every 4 hours, neurology and primary team should be notified if this is declining - Continue heparin drip for possible PLEX catheter placement but hold off on catheter placement and transfer to [facility] for now given patient's improvement - Neurology will continue to follow  # Macrocytic anemia -  check b12, folate, mma (ordered)  Appreciate management of comorbidities and coordination of care by primary team  Lesleigh Noe MD-PhD [facility] [phone]   Drugs to avoid if you have Myasthenia Gravis:   Many drugs have been reported to have adverse effects in patients with MG (see below). However, not all patients react adversely to all these drugs. Conversely, not all "safe" drugs can be used with impunity in patients with MG.As a rule, the listed drugs should be avoided whenever possible, and patients with MG should be followed closely when any new drug is  introduced.  Drugs that may exacerbate MG  Antibiotics  Aminoglycosides: e.g., streptomycin, tobramycin, kanamycin  Quinolones: e.g., ciprofloxacin, levofloxacin, ofloxacin, gatifloxacin  Macrolides: e.g., erythromycin, azithromycin, telithromycin  Nondepolarizing muscle relaxants for surgery  d-Tubocurarine (curare), pancuronium, vecuronium, atracurium  Beta-blocking agents  Propranolol, atenolol, metoprolol  Local anesthetics and related agents  Procaine, Xylocaine in large amounts  Procainamide (for arrhythmias)  Botulinum toxin  Botox exacerbates weakness.  Quinine derivatives  Quinine, quinidine, chloroquine, mefloquine ([place])  Magnesium  Decreases ACh release  Penicillamine  May cause MG  Drugs with important interactions in MG  Cyclosporine  Broad range of drug interactions, which may raise or lower cyclosporine levels  Azathioprine  Avoid allopurinol; combination may result in myelosuppression.

## 2022-06-10 NOTE — Progress Notes (Signed)
[facility] Progress Note  Patient: Joseph Macdonald    LAG:[id number]  DOA: 06/08/2022    Date of Service: the patient was seen and examined on 06/10/2022  Brief hospital course: 76 year old male with past medical history of COPD, diabetes mellitus, diastolic heart failure, paroxysmal A-fib and pulmonary embolus presented to the emergency room on 6/29 with complaints of cough and shortness of breath x4 days.  Patient had a recent hospitalization and was discharged on 6/8 for cardiac arrest for which she was intubated and then treated for heart failure and then discharged to skilled nursing.  Patient had just been discharged from skilled nursing and been at home for a week.  Patient found to be hypoxic and was admitted to the hospitalist service for CHF and COPD exacerbation.  Following admission, patient reported issues with leg weakness and speech difficulty and possibly swallowing, concerning for myasthenia flare.  During recent hospitalization, patient was treated with IVIG.  Neurology consulted and given findings of hypercarbia as well as diplopia and ptosis plus NIF of -20, initial plan was for transfer to [facility] for plasma exchange.  Due to bed availability, patient unable to be transferred on 6/30.  In the meantime, received increased dose of pyridostigmine.  Weakness symptoms improved.  Assessment and Plan: Assessment and Plan: Myasthenia gravis ([place]) Following admission, patient reported issues with leg weakness and speech difficulty and possibly swallowing, concerning for myasthenia flare.  During recent hospitalization, patient was treated with IVIG.  Neurology consulted and given findings of hypercarbia as well as diplopia and ptosis plus NIF of -20, and initial plan was for transfer to facility for plasma exchange.  This was delayed due to bed availability.  However, patient responded to increased dosing of pyridostigmine and now doing much better.  Appreciate neurology help.   Continue pyridostigmine at higher dose.  Slow prednisone taper.  Continue following NIF/FVC every 4 hours.  Avoid medications that could make myasthenia worse including beta-blocker.  Acute respiratory failure with hypoxia and hypercarbia (HCC) Initially recommended BiPAP though the patient refused. Is normally on 2 L at night and i presented on 4 L.  Secondary to both COPD and CHF.  Oxygenation has improved and patient has been tolerating 2 L continuous for most of today.  Acute on chronic combined systolic and diastolic CHF (congestive heart failure) ([place])  - He had a recent 2D echo that revealed an EF of 35 to 40% and grade 1 diastolic dysfunction on 01/02/4824. To date has diuresed over 5 L and is -4 L deficient. - We will continue Entresto and Toprol-XL although held this morning due to softer blood pressures.  COPD with acute exacerbation ([place]) - We will continue steroid therapy with IV Solu-Medrol and bronchodilator therapy with DuoNebs 4 times daily and every 4 hours as needed as well as antibiotic therapy with IV Rocephin.  Steroid taper. - We will hold off Symbicort.  Paroxysmal atrial fibrillation (HCC) - We will continue Eliquis, Toprol-XL and digoxin.  Chronic pain Patient normally on Percocet 5/325 every 6 hours scheduled.  Initially this was not on his med rec as wife forgot to mention it and he gets this medicine from a different pharmacy than the [place]. Restarted.Marland Kitchen  Dyslipidemia - We will continue statin therapy.  Morbid obesity ([place]) Meets criteria BMI greater than 35 & comorbidities of hypertension and heart failure       Body mass index is 36.43 kg/m.    Pressure Injury Buttocks Right Stage 2 -  Partial  thickness loss of dermis presenting as a shallow open injury with a red, pink wound bed without slough. (Active)     Location: Buttocks  Location Orientation: Right  Staging: Stage 2 -  Partial thickness loss of dermis presenting as a shallow open injury with a  red, pink wound bed without slough.  Wound Description (Comments):   Present on Admission: No  Dressing Type Foam - Lift dressing to assess site every shift 05/17/22 0745     Consultants: Neurology  Procedures: None  Antimicrobials: None  Code Status: Full code   Subjective: Patient feels breathing is much better.  He also feels his strength is much better is actually able to stand on his own.  Objective: Vital signs were reviewed and unremarkable. Vitals:   06/10/22 0727 06/10/22 1210  BP: 128/84 120/72  Pulse: (!) 56 61  Resp: 18 18  Temp: 98 F (36.7 C) 97.9 F (36.6 C)  SpO2: 97% 95%    Intake/Output Summary (Last 24 hours) at 06/10/2022 1542 Last data filed at 06/10/2022 1213 Gross per 24 hour  Intake 480 ml  Output 3885 ml  Net -3405 ml    Filed Weights   06/08/22 1703 06/09/22 0105 06/10/22 0420  Weight: 107.3 kg 104 kg 102.4 kg   Body mass index is 36.43 kg/m.  Exam:  General: Alert and oriented x3, no acute distress HEENT: Normocephalic, atraumatic, mucous membranes are slightly dry, narrow airway Cardiovascular: Regular rate and rhythm, S1-S2 Respiratory: Decreased breath sounds bibasilar, much better airway exchange Abdomen: Soft, obese, nontender, positive bowel sounds Musculoskeletal: No clubbing or cyanosis, 1+ pitting edema from the knees down bilaterally, left greater than right Skin: Chronic venous stasis Psychiatry: Appropriate, no evidence of psychoses Neurology: Neurological exam looks to be improved.  Minimal if any ptosis.  Data Reviewed: White blood cell count increased to 14.7, although on steroids.  Disposition:  Status is: Inpatient Remains inpatient appropriate because: Further diuresis, improvement in oxygenation, continue treatment of myasthenia    Anticipated discharge date: 7/3  Family Communication: Wife at the bedside DVT Prophylaxis: heparin bolus via infusion 2,600 Units Start: 06/10/22 1615    Author: Annita Brod ,MD 06/10/2022 3:42 PM  To reach On-call, see care teams to locate the attending and reach out via [email or web address]. Between 7PM-7AM, please contact night-coverage If you still have difficulty reaching the attending provider, please page the [facility] (Director on Call) for [facility] on amion for assistance.

## 2022-06-10 NOTE — Progress Notes (Signed)
NIF -40,

## 2022-06-10 NOTE — Evaluation (Addendum)
Clinical/Bedside Swallow Evaluation Patient Details  Name: Joseph Macdonald MRN: [id number] Date of Birth: Jan 17, 1946  Today's Date: 06/10/2022 Time: SLP Start Time (ACUTE ONLY): 0820 SLP Stop Time (ACUTE ONLY): 0920 SLP Time Calculation (min) (ACUTE ONLY): 60 min  Past Medical History:  Past Medical History:  Diagnosis Date   Arthritis    COPD (chronic obstructive pulmonary disease) ([place])    Diabetes mellitus without complication ([place])    diet controlled   Dyspnea    with activity   Hyperlipidemia    Hypertension    Myasthenia gravis ([place])    Oxygen deficit    Ventricular tachycardia ([place])    Past Surgical History:  Past Surgical History:  Procedure Laterality Date   carpel tunnel sx     CATARACT EXTRACTION     right eye   CATARACT EXTRACTION W/PHACO Left 12/01/2020   Procedure: CATARACT EXTRACTION PHACO AND INTRAOCULAR LENS PLACEMENT (IOC) LEFT 10.83 01:14.1 14.6%;  Surgeon: Leandrew Koyanagi, MD;  Location: [facility];  Service: Ophthalmology;  Laterality: Left;   INTRAVASCULAR PRESSURE WIRE/FFR STUDY N/A 05/16/2022   Procedure: INTRAVASCULAR PRESSURE WIRE/FFR STUDY;  Surgeon: Nelva Bush, MD;  Location: [facility];  Service: Cardiovascular;  Laterality: N/A;   RETINAL DETACHMENT SURGERY     RIGHT/LEFT HEART CATH AND CORONARY ANGIOGRAPHY N/A 05/16/2022   Procedure: RIGHT/LEFT HEART CATH AND CORONARY ANGIOGRAPHY;  Surgeon: Nelva Bush, MD;  Location: [place] [facility];  Service: Cardiovascular;  Laterality: N/A;   SKIN GRAFT     Behind left knee   TONSILLECTOMY AND ADENOIDECTOMY     HPI:  Pt is a 76 y.o. with PMHx of  has a past medical history of Arthritis, COPD (chronic obstructive pulmonary disease), Diabetes mellitus without complication, Dyspnea, Hyperlipidemia, Hypertension, Myasthenia Gravis, Oxygen deficit, and Ventricular tachycardia.  Pt was recently admitted for Cardiac Arrest 5/21-05/18/2022.  He discharged to SNF for ~2 weeks and was  just discharged home ~2-3 prior to this readmit -- Admitting Dx/Complaints this admit: increasing weakness, leg swelling and shortness of breath; malaise and leg swelling with some weight gain.  Pt's Wife stated he was having a difficult time walking at home.  CXR: Cardiomegaly. There are no signs of pulmonary edema. There are  linear densities in the lower lung fields suggesting subsegmental  atelectasis/pneumonia. Small bilateral pleural effusions.    Assessment / Plan / Recommendation  Clinical Impression   Pt seen for BSE this morning. Pt alert, pleasant, and cooperative. On 2-4L/min O2 via [place]. Wife arrived post session; education provided on BSE and recommendations(as discussed yesterday) and Handout given.   Pt was asked to sit in the Chair at bedside for better upright positioning; NT and SLP assisted in his positioning. Oral care completed by pt given setup support.   Pt appears to present w/ adequate oropharyngeal phase swallow function w/ No oropharyngeal phase dysphagia noted during evaluation. Pt does have a Baseline Dx of Myasthenia Gravis and often experiences fatigue w/ exerting tasks -- pt has multiple co-morbidities as well including Cardiac issues. Pt consumed po trials w/ No overt, clinical s/s of aspiration during po trials. ANY Pulmonary decline and/or fatigue from neuromuscular issues can impact oropharyngeal swallowing.  Pt appears at reduced risk for aspiration following general aspiration precautions and using a slightly modified diet consistency to include soft, cooked cohesive foods easy to chew/swallow. Pt is also missing Dentition which impacts mastication.   During po trials, pt consumed all consistencies w/ no overt coughing, decline in vocal quality,  or change in respiratory presentation during/post trials. Laryngeal excursion appeared [facility]. O2 sats remained 97%. Oral phase appeared [facility] w/ timely bolus management, mastication, and control of bolus propulsion for A-P transfer  for swallowing w/ liquids and soft, cooked foods. Pt was instructed to use SMALL BITES for mastication management and ease of swallowing (smaller bolus in the pharynx-esophagus). Oral clearing achieved w/ all trial consistencies. Pt took his time to clear b/t bites of food alternating foods and liquids at times. OM Exam appeared [facility] w/ no unilateral weakness noted. Speech Clear, intelligible. Pt fed self w/ min setup support.  Pt DENIED any difficulty eating/drinking this morning.    Recommend continue a more Mech Soft consistency diet w/ well-Cut meats, moistened foods -- handout given on such supporting eating w/ MG; Thin liquids. Recommend general aspiration precautions, food prepared for ease of chewing/eating -- SMALL BITES. Sitting up in a chair for meals. Rest Breaks during meals as needed for conservation of energy. Pills WHOLE in Puree for safer, easier swallowing as pt does at home. Education given on Pills in Puree; food consistencies and easy to eat options w/ MG(handout); general aspiration precautions. NSG to reconsult if any new needs arise. MD/NSG updated/agreed. SLP Visit Diagnosis: Dysphagia, oral phase (R13.11) (min; missing Dentition, took his time to lessen fatigue w/ exertion(MG))    Aspiration Risk   (reduced following general aspiration precautions)    Diet Recommendation   Mech Soft consistency diet w/ well-Cut meats, moistened foods -- handout given on such supporting eating w/ MG; Thin liquids. Recommend general aspiration precautions, food prepared for ease of chewing/eating -- SMALL BITES. Sitting up in a chair for meals. Rest Breaks during meals as needed for conservation of energy.   Medication Administration: Whole meds with puree (baseline at home for ease)    Other  Recommendations Recommended Consults:  (Dietician f/u) Oral Care Recommendations: Oral care BID;Oral care before and after PO;Patient independent with oral care (setup support) Other Recommendations:  (n/a)     Recommendations for follow up therapy are one component of a multi-disciplinary discharge planning process, led by the attending physician.  Recommendations may be updated based on patient status, additional functional criteria and insurance authorization.  Follow up Recommendations No SLP follow up      Assistance Recommended at Discharge PRN (setup; food prep)  Functional Status Assessment Patient has had a recent decline in their functional status and demonstrates the ability to make significant improvements in function in a reasonable and predictable amount of time.  Frequency and Duration  (n/a)   (n/a)       Prognosis Prognosis for Safe Diet Advancement: Good Barriers to Reach Goals: Time post onset;Severity of deficits Barriers/Prognosis Comment: baseline Myasthernia Gravis      Swallow Study   General Date of Onset: 06/08/22 HPI: Pt is a 76 y.o. with PMHx of  has a past medical history of Arthritis, COPD (chronic obstructive pulmonary disease), Diabetes mellitus without complication, Dyspnea, Hyperlipidemia, Hypertension, Myasthenia Gravis, Oxygen deficit, and Ventricular tachycardia.  Pt was recently admitted for Cardiac Arrest 5/21-05/18/2022.  He discharged to SNF for ~2 weeks and was just discharged home ~2-3 prior to this readmit -- Admitting Dx/Complaints this admit: increasing weakness, leg swelling and shortness of breath; malaise and leg swelling with some weight gain.  Pt's Wife stated he was having a difficult time walking at home.  CXR: Cardiomegaly. There are no signs of pulmonary edema. There are  linear densities in the lower lung fields suggesting subsegmental  atelectasis/pneumonia. Small bilateral pleural effusions. Type of Study: Bedside Swallow Evaluation Previous Swallow Assessment: BSE 5/28-5/29/23 Diet Prior to this Study: Dysphagia 3 (soft);Thin liquids Temperature Spikes Noted: No (2-4L) Respiratory Status: Nasal cannula History of Recent Intubation:  No Behavior/Cognition: Alert;Cooperative;Pleasant mood;Distractible;Requires cueing (intermittently) Oral Cavity Assessment: Within Functional Limits Oral Care Completed by SLP: Yes Oral Cavity - Dentition: Missing dentition (few) Vision: Functional for self-feeding Self-Feeding Abilities: Able to feed self;Needs set up Patient Positioning: Upright in chair (requested pt sit in the chair for safer eating at meals) Baseline Vocal Quality: Normal Volitional Cough: Strong Volitional Swallow: Able to elicit    Oral/Motor/Sensory Function Overall Oral Motor/Sensory Function: Within functional limits (no overt weakness; no unilateral weakness. Speech clear.)   Ice Chips Ice chips: Within functional limits Presentation: Spoon (fed; 2 trials)   Thin Liquid Thin Liquid: Within functional limits Presentation: Self Fed;Cup;Straw (5+ trials via cup(coffee); ~8ozs via straw)    Nectar Thick Nectar Thick Liquid: Not tested   Honey Thick Honey Thick Liquid: Not tested   Puree Puree: Within functional limits Presentation: Spoon;Self Fed (~4 ozs)   Solid     Solid: Impaired (min) Presentation: Self Fed;Spoon (15+ trials) Oral Phase Impairments: Impaired mastication (missing Dentition - took his time) Oral Phase Functional Implications: Impaired mastication (missing Dentition) Pharyngeal Phase Impairments:  (none)        Orinda Kenner, MS, CCC-SLP Speech Language Pathologist Rehab Services; [place] [phone] (ascom) [place] 06/10/2022,11:45 AM

## 2022-06-11 DIAGNOSIS — J441 Chronic obstructive pulmonary disease with (acute) exacerbation: Secondary | ICD-10-CM | POA: Diagnosis not present

## 2022-06-11 DIAGNOSIS — G7000 Myasthenia gravis without (acute) exacerbation: Secondary | ICD-10-CM | POA: Diagnosis not present

## 2022-06-11 DIAGNOSIS — I5043 Acute on chronic combined systolic (congestive) and diastolic (congestive) heart failure: Secondary | ICD-10-CM | POA: Diagnosis not present

## 2022-06-11 DIAGNOSIS — J9601 Acute respiratory failure with hypoxia: Secondary | ICD-10-CM | POA: Diagnosis not present

## 2022-06-11 LAB — CBC
HCT: 41.9 % (ref 39.0–52.0)
Hemoglobin: 12.6 g/dL — ABNORMAL LOW (ref 13.0–17.0)
MCH: 29.2 pg (ref 26.0–34.0)
MCHC: 30.1 g/dL (ref 30.0–36.0)
MCV: 97.2 fL (ref 80.0–100.0)
Platelets: 162 10*3/uL (ref 150–400)
RBC: 4.31 MIL/uL (ref 4.22–5.81)
RDW: 15.8 % — ABNORMAL HIGH (ref 11.5–15.5)
WBC: 15.3 10*3/uL — ABNORMAL HIGH (ref 4.0–10.5)
nRBC: 0 % (ref 0.0–0.2)

## 2022-06-11 LAB — BASIC METABOLIC PANEL
Anion gap: 7 (ref 5–15)
BUN: 22 mg/dL (ref 8–23)
CO2: 39 mmol/L — ABNORMAL HIGH (ref 22–32)
Calcium: 7.2 mg/dL — ABNORMAL LOW (ref 8.9–10.3)
Chloride: 91 mmol/L — ABNORMAL LOW (ref 98–111)
Creatinine, Ser: 0.8 mg/dL (ref 0.61–1.24)
GFR, Estimated: >60 mL/min (ref >60)
Glucose, Bld: 98 mg/dL (ref 70–99)
Potassium: 3 mmol/L — ABNORMAL LOW (ref 3.5–5.1)
Sodium: 137 mmol/L (ref 135–145)

## 2022-06-11 LAB — GLUCOSE, CAPILLARY
Glucose-Capillary: 145 mg/dL — ABNORMAL HIGH (ref 70–99)
Glucose-Capillary: 250 mg/dL — ABNORMAL HIGH (ref 70–99)
Glucose-Capillary: 315 mg/dL — ABNORMAL HIGH (ref 70–99)
Glucose-Capillary: 172 mg/dL — ABNORMAL HIGH (ref 70–99)

## 2022-06-11 LAB — HEPARIN LEVEL (UNFRACTIONATED)
Heparin Unfractionated: 0.78 IU/mL — ABNORMAL HIGH (ref 0.30–0.70)
Heparin Unfractionated: 0.69 IU/mL (ref 0.30–0.70)

## 2022-06-11 LAB — APTT
aPTT: 109 seconds — ABNORMAL HIGH (ref 24–36)
aPTT: 74 seconds — ABNORMAL HIGH (ref 24–36)

## 2022-06-11 MED ORDER — POTASSIUM CHLORIDE CRYS ER 20 MEQ PO TBCR
40.0000 meq | EXTENDED_RELEASE_TABLET | Freq: Once | ORAL | Status: AC
Start: 2022-06-11 — End: 2022-06-11
  Administered 2022-06-11: 40 meq via ORAL
  Filled 2022-06-11: qty 2

## 2022-06-11 MED ORDER — IPRATROPIUM-ALBUTEROL 0.5-2.5 (3) MG/3ML IN SOLN
3.0000 mL | Freq: Three times a day (TID) | RESPIRATORY_TRACT | Status: DC
  Administered 2022-06-11 – 2022-06-13 (×7): 3 mL via RESPIRATORY_TRACT
  Filled 2022-06-11 (×7): qty 3

## 2022-06-11 NOTE — Progress Notes (Signed)
RT note: NIF: -26cmH2O   Best effort X 3 attempts

## 2022-06-11 NOTE — Progress Notes (Addendum)
[facility] Progress Note  Patient: Joseph Macdonald    ZOX:[id number]  DOA: 06/08/2022    Date of Service: the patient was seen and examined on 06/11/2022  Brief hospital course: 76 year old male with past medical history of COPD, diabetes mellitus, diastolic heart failure, paroxysmal A-fib and pulmonary embolus presented to the emergency room on 6/29 with complaints of cough and shortness of breath x4 days.  Patient had a recent hospitalization and was discharged on 6/8 for cardiac arrest for which she was intubated and then treated for heart failure and then discharged to skilled nursing.  Patient had just been discharged from skilled nursing and been at home for a week.  Patient found to be hypoxic and was admitted to the hospitalist service for CHF and COPD exacerbation.  Following admission, patient reported issues with leg weakness and speech difficulty and possibly swallowing, concerning for myasthenia flare.  During recent hospitalization, patient was treated with IVIG.  Neurology consulted and given findings of hypercarbia as well as diplopia and ptosis plus NIF of -20, initial plan was for transfer to [facility] for plasma exchange.  Due to bed availability, patient unable to be transferred on 6/30.  In the meantime, received increased dose of pyridostigmine.  Weakness symptoms improved.  Assessment and Plan: Assessment and Plan: Myasthenia gravis ([place]) Following admission, patient reported issues with leg weakness and speech difficulty and possibly swallowing, concerning for myasthenia flare.  During recent hospitalization, patient was treated with IVIG.  Neurology consulted and given findings of hypercarbia as well as diplopia and ptosis plus NIF of -20, and initial plan was for transfer to facility for plasma exchange.  This was delayed due to bed availability.  However, patient responded to increased dosing of pyridostigmine and now doing much better.  Appreciate neurology help.   Continue pyridostigmine at higher dose.  - Slow taper of prednisone once COPD exacerbation treatment complete, taper to 60 mg daily for 1 week, then 50 mg daily for 1 week, then 40 mg daily for 1 week and then 30 mg daily and hold until neurology follow-up..    Continue following NIF/FVC every 4 hours.  Avoid medications that could make myasthenia worse so metoprolol discontinued.  Neurology feels immediate crisis has passed  Acute respiratory failure with hypoxia and hypercarbia ([place]) Initially recommended BiPAP though the patient refused. Is normally on 2 L at night and i presented on 4 L.  Secondary to both COPD and CHF.  Oxygenation has improved and patient has been tolerating 2 L continuous currently.   Acute on chronic combined systolic and diastolic CHF (congestive heart failure) ([place])  - He had a recent 2D echo that revealed an EF of 35 to 40% and grade 1 diastolic dysfunction on 07/21/9146. To date has diuresed almost 10 L and is more than -7.5 L deficient. - We will continue Entresto, holding the blood pressure soft.  Still some orthopnea.  COPD with acute exacerbation ([place]) - We will continue steroid therapy with IV Solu-Medrol and bronchodilator therapy with DuoNebs 4 times daily and every 4 hours as needed as well as antibiotic therapy with IV Rocephin.  Steroid taper. - We will hold off Symbicort.  Paroxysmal atrial fibrillation (HCC) - We will continue Eliquis, Toprol-XL and digoxin.  Chronic pain Patient normally on Percocet 5/325 every 6 hours scheduled.  Initially this was not on his med rec as wife forgot to mention it and he gets this medicine from a different pharmacy than the [place]. Restarted.Marland Kitchen  Dyslipidemia -  We will continue statin therapy.  Morbid obesity ([place]) Meets criteria BMI greater than 35 & comorbidities of hypertension and heart failure       Body mass index is 36.72 kg/m.    Pressure Injury Buttocks Right Stage 2 -  Partial thickness loss of dermis  presenting as a shallow open injury with a red, pink wound bed without slough. (Active)     Location: Buttocks  Location Orientation: Right  Staging: Stage 2 -  Partial thickness loss of dermis presenting as a shallow open injury with a red, pink wound bed without slough.  Wound Description (Comments):   Present on Admission: No  Dressing Type Foam - Lift dressing to assess site every shift 05/17/22 0745     Consultants: Neurology  Procedures: None  Antimicrobials: None  Code Status: Full code   Subjective: Feels like breathing continues to improve.  Objective: Vital signs were reviewed and unremarkable. Vitals:   06/11/22 0800 06/11/22 1158  BP: 126/70 111/75  Pulse: 83 (!) 112  Resp: 18 20  Temp: 98.6 F (37 C) 97.8 F (36.6 C)  SpO2: 92% 90%    Intake/Output Summary (Last 24 hours) at 06/11/2022 1410 Last data filed at 06/11/2022 0802 Gross per 24 hour  Intake 1071.86 ml  Output 4450 ml  Net -3378.14 ml   Filed Weights   06/09/22 0105 06/10/22 0420 06/11/22 0428  Weight: 104 kg 102.4 kg 103.2 kg   Body mass index is 36.72 kg/m.  Exam:  General: Alert and oriented x3, no acute distress HEENT: Normocephalic, atraumatic, mucous membranes are slightly dry, narrow airway Cardiovascular: Regular rate and rhythm, S1-S2 Respiratory: Decreased breath sounds bibasilar, much better airway exchange Abdomen: Soft, obese, nontender, positive bowel sounds Musculoskeletal: No clubbing or cyanosis, 1+ pitting edema from the knees down bilaterally, left greater than right Skin: Chronic venous stasis Psychiatry: Appropriate, no evidence of psychoses Neurology: Neurological exam looks to be improved.  Minimal if any ptosis.  Data Reviewed: Potassium 3.0.  White count trending upward, but on steroids  Disposition:  Status is: Inpatient Remains inpatient appropriate because: Further diuresis, improvement in oxygenation, continue treatment of myasthenia    Anticipated  discharge date: 7/4  Family Communication: Wife at the bedside DVT Prophylaxis:   Heparin    Author: Annita Brod ,MD 06/11/2022 2:10 PM  To reach On-call, see care teams to locate the attending and reach out via [email or web address]. Between 7PM-7AM, please contact night-coverage If you still have difficulty reaching the attending provider, please page the [facility] (Director on Call) for [facility] on amion for assistance.

## 2022-06-11 NOTE — Progress Notes (Signed)
Neurology Progress Note  Patient ID: Joseph Macdonald is a 76 y.o. with PMHx of  has a past medical history of Arthritis, COPD (chronic obstructive pulmonary disease) ([place]), Diabetes mellitus without complication ([place]), Dyspnea, Hyperlipidemia, Hypertension, Myasthenia gravis ([place]), Oxygen deficit, and Ventricular tachycardia ([place]).   Subjective: - Continues to feel better with improving but still mild diplopia - Reports he has been off of Cellcept for many months now, reportedly due to GI symptoms (abdominal pain w/ eating) - No side effects of increased pyridostigmine   Exam: Current vital signs: BP 126/70 (BP Location: Right Arm)   Pulse 83   Temp 98.6 F (37 C) (Oral)   Resp 18   Ht '5\' 6"'$  (1.676 m)   Wt 103.2 kg   SpO2 92%   BMI 36.72 kg/m  Vital signs in last 24 hours: Temp:  [97.4 F (36.3 C)-98.6 F (37 C)] 98.6 F (37 C) (07/02 0800) Pulse Rate:  [57-83] 83 (07/02 0800) Resp:  [18-20] 18 (07/02 0800) BP: (120-139)/(62-72) 126/70 (07/02 0800) SpO2:  [92 %-95 %] 92 % (07/02 0800) Weight:  [103.2 kg] 103.2 kg (07/02 0428)   Gen: Sleeping while sitting up in bed, but awakes easily Resp: quickly short of breath with strength testing Cardiac: Perfusing extremities well  Abd: soft, nt  Neuro: MS: Sleepy but awakens quickly, briskly following commands, oriented to person, place and situation MG focused testing Minimal ptosis on my examination this morning, able to maintain upgaze for over 60 seconds but he does start to develop some diplopia and repeatedly distracts away from looking up after 10 seconds. Diplopia on right gaze has resolved and he continues to have very minimal diplopia on left gaze Single breath test can count to 16 Neck flexion 5/5, hip flexion 4+/5 on the right 5/5 on the left  Slightly less clear speech than yesterday    6/30 4 PM  6/30 9 PM (poor effort)  7/1 noon   7/2 8 AM   NIF  - 20   -20  -40  -26   FVC  870   620   Not recorded  Not  recorded    Pertinent Labs:  Basic Metabolic Panel: Recent Labs  Lab 06/08/22 1436 06/09/22 0455 06/11/22 0455  NA 141 142 137  K 4.8 4.3 3.0*  CL 98 98 91*  CO2 33* 36* 39*  GLUCOSE 169* 187* 98  BUN '20 21 22  '$ CREATININE 0.96 0.84 0.80  CALCIUM 7.7* 7.6* 7.2*     CBC: Recent Labs  Lab 06/08/22 1436 06/09/22 0455 06/10/22 0519 06/11/22 0455  WBC 13.0* 11.3* 14.7* 15.3*  HGB 13.6 13.3 12.5* 12.6*  HCT 47.2 45.4 42.5 41.9  MCV 102.8* 101.3* 100.5* 97.2  PLT 194 158 166 162     Coagulation Studies: Recent Labs    06/08/22 1614  LABPROT 14.4  INR 1.1     Lab Results  Component Value Date   VITAMINB12 576 06/10/2022    Lab Results  Component Value Date   FOLATE 8.6 06/10/2022      Impression: Overall stable exam. Some of his slight worsening on exam may be secondary to hypercarbia from untreated OSA this morning as he was sleeping when I work him up. Given he reports he feels stable/improved from [facility] perspective, will not make any other adjustments today.   Recommendations:  # MG stable to improving - Continue to avoid drugs that exacerbate MG listed below  - Appreciate discontinuation of metoprolol  -  Continue pyridostigmine 60 mg 3 times daily, up from prior dose of 30 mg 3 times daily - AChR antibody drawn 06/10/2022 to be followed up by outpatient neurologist to trend for assessment of disease control - Slow taper of prednisone once COPD exacerbation treatment complete, taper to 60 mg daily for 1 week, then 50 mg daily for 1 week, then 40 mg daily for 1 week and then 30 mg daily and hold until neurology follow-up.  This may be adjusted by outpatient neurologist if patient is seen sooner - Continue NIF/FVC every 4 hours while awake, neurology and primary team should be notified if this is declining - Continue heparin drip for possible PLEX catheter placement; transition back to Eliquis on discharge - Close outpatient follow-up for consideration of  adjustment of DMD  - Neurology will be available on an as-needed basis, please reach out if any questions or concerns arise  Appreciate management of comorbidities and coordination of care by primary team  Lesleigh Noe MD-PhD [facility] [phone]   Drugs to avoid if you have Myasthenia Gravis:   Many drugs have been reported to have adverse effects in patients with MG (see below). However, not all patients react adversely to all these drugs. Conversely, not all "safe" drugs can be used with impunity in patients with MG.As a rule, the listed drugs should be avoided whenever possible, and patients with MG should be followed closely when any new drug is introduced.  Drugs that may exacerbate MG  Antibiotics  Aminoglycosides: e.g., streptomycin, tobramycin, kanamycin  Quinolones: e.g., ciprofloxacin, levofloxacin, ofloxacin, gatifloxacin  Macrolides: e.g., erythromycin, azithromycin, telithromycin  Nondepolarizing muscle relaxants for surgery  d-Tubocurarine (curare), pancuronium, vecuronium, atracurium  Beta-blocking agents  Propranolol, atenolol, metoprolol  Local anesthetics and related agents  Procaine, Xylocaine in large amounts  Procainamide (for arrhythmias)  Botulinum toxin  Botox exacerbates weakness.  Quinine derivatives  Quinine, quinidine, chloroquine, mefloquine ([place])  Magnesium  Decreases ACh release  Penicillamine  May cause MG  Drugs with important interactions in MG  Cyclosporine  Broad range of drug interactions, which may raise or lower cyclosporine levels  Azathioprine  Avoid allopurinol; combination may result in myelosuppression.

## 2022-06-11 NOTE — Progress Notes (Signed)
[place] for heparin drip Indication:  transition off apixaban for procedure /hx PE/afib  Allergies  Allergen Reactions   Lisinopril Swelling    Patient Measurements: Height: '5\' 6"'$  (167.6 cm) Weight: 103.2 kg (227 lb 8.2 oz) IBW/kg (Calculated) : 63.8 Heparin Dosing Weight: 87 kg  Vital Signs: Temp: 97.4 F (36.3 C) (07/02 0428) Temp Source: Oral (07/02 0428) BP: 126/63 (07/02 0428) Pulse Rate: 68 (07/02 0428)  Labs: Recent Labs    06/08/22 1436 06/08/22 1436 06/08/22 1614 06/08/22 2153 06/09/22 0455 06/09/22 1944 06/10/22 0519 06/10/22 1442 06/10/22 2209 06/11/22 0455  HGB 13.6  --   --   --  13.3  --  12.5*  --   --  12.6*  HCT 47.2  --   --   --  45.4  --  42.5  --   --  41.9  PLT 194  --   --   --  158  --  166  --   --  162  APTT  --    < > 30  --   --  28 47* 55* 98* 109*  LABPROT  --   --  14.4  --   --   --   --   --   --   --   INR  --   --  1.1  --   --   --   --   --   --   --   HEPARINUNFRC  --   --   --   --   --  0.92*  --  0.57  --  0.78*  CREATININE 0.96  --   --   --  0.84  --   --   --   --  0.80  TROPONINIHS  --   --  19* 16  --   --   --   --   --   --    < > = values in this interval not displayed.     Estimated Creatinine Clearance: 89.8 mL/min (by C-G formula based on SCr of 0.8 mg/dL).   Medical History: Past Medical History:  Diagnosis Date   Arthritis    COPD (chronic obstructive pulmonary disease) ([place])    Diabetes mellitus without complication (HCC)    diet controlled   Dyspnea    with activity   Hyperlipidemia    Hypertension    Myasthenia gravis ([place])    Oxygen deficit    Ventricular tachycardia (HCC)     Medications:  Scheduled:   cholecalciferol  2,000 Units Oral Daily   digoxin  0.125 mg Oral Daily   empagliflozin  25 mg Oral Daily   furosemide  40 mg Intravenous Q12H   HYDROcodone-acetaminophen  1 tablet Oral Q6H   insulin aspart  0-15 Units Subcutaneous TID AC & HS    ipratropium-albuterol  3 mL Nebulization QID   methylPREDNISolone (SOLU-MEDROL) injection  80 mg Intravenous Q24H   metoprolol succinate  50 mg Oral Daily   OLANZapine zydis  5 mg Oral BID   pantoprazole  40 mg Oral Daily   pramipexole  0.5 mg Oral QHS   pravastatin  20 mg Oral Daily   pyridostigmine  60 mg Oral TID   sacubitril-valsartan  1 tablet Oral BID   terbinafine  250 mg Oral Daily   tiotropium  18 mcg Inhalation Daily   vitamin B-12  1,000 mcg Oral Daily   Infusions:  cefTRIAXone (ROCEPHIN)  IV Stopped (06/10/22 2129)   heparin 1,650 Units/hr (06/11/22 0433)   Last dose of apixaban 06/09/22 @ 0826  Assessment: 76 yo male on apixaban PTA for hx PE./afib. TRansition to heparin drip to facilitate placement of PLEX catheter for MG flare Hgb 13.3  plt 158  aPTT 28  HL=0.92   Goal of Therapy:  Heparin level 0.3-0.7 units/ml aPTT 66-102 seconds Monitor platelets by anticoagulation protocol: Yes  7/01 0519 aPTT 47, subtherapeutic 7/01 1442 aPTT 55, HL=0.57,  subtherapeutic, not correlating,will increase from 1400 units/hr to 1650 units/hr 7/01 2209 aPTT 98, therapeutic x 1 7/02 0455 aPTT 109 slightly therapeutic; HL 0.78    Plan:  -Decrease heparin infusion to 1550 units/hr -Recheck aPTT in 8 hrs after rate change -HL& aPTT starting to correlate, recheck HL to confirm -CBC daily while on heparin  Renda Rolls, PharmD, [facility] 06/11/2022 5:45 AM

## 2022-06-11 NOTE — Progress Notes (Signed)
[place] for heparin drip Indication:  transition off apixaban for procedure /hx PE/afib  Allergies  Allergen Reactions   Lisinopril Swelling    Patient Measurements: Height: '5\' 6"'$  (167.6 cm) Weight: 103.2 kg (227 lb 8.2 oz) IBW/kg (Calculated) : 63.8 Heparin Dosing Weight: 87 kg  Vital Signs: Temp: 98.5 F (36.9 C) (07/02 1556) Temp Source: Oral (07/02 1556) BP: 139/73 (07/02 1556) Pulse Rate: 113 (07/02 1556)  Labs: Recent Labs     0000 06/08/22 2153 06/09/22 0455 06/09/22 1944 06/10/22 0519 06/10/22 1442 06/10/22 2209 06/11/22 0455 06/11/22 1400  HGB   < >  --  13.3  --  12.5*  --   --  12.6*  --   HCT  --   --  45.4  --  42.5  --   --  41.9  --   PLT  --   --  158  --  166  --   --  162  --   APTT  --   --   --    < > 47* 55* 98* 109* 74*  HEPARINUNFRC  --   --   --    < >  --  0.57  --  0.78* 0.69  CREATININE  --   --  0.84  --   --   --   --  0.80  --   TROPONINIHS  --  16  --   --   --   --   --   --   --    < > = values in this interval not displayed.     Estimated Creatinine Clearance: 89.8 mL/min (by C-G formula based on SCr of 0.8 mg/dL).   Medical History: Past Medical History:  Diagnosis Date   Arthritis    COPD (chronic obstructive pulmonary disease) ([place])    Diabetes mellitus without complication (HCC)    diet controlled   Dyspnea    with activity   Hyperlipidemia    Hypertension    Myasthenia gravis ([place])    Oxygen deficit    Ventricular tachycardia (HCC)     Medications:  Scheduled:   cholecalciferol  2,000 Units Oral Daily   digoxin  0.125 mg Oral Daily   empagliflozin  25 mg Oral Daily   furosemide  40 mg Intravenous Q12H   HYDROcodone-acetaminophen  1 tablet Oral Q6H   insulin aspart  0-15 Units Subcutaneous TID AC & HS   ipratropium-albuterol  3 mL Nebulization TID   methylPREDNISolone (SOLU-MEDROL) injection  80 mg Intravenous Q24H   OLANZapine zydis  5 mg Oral BID   pantoprazole   40 mg Oral Daily   pramipexole  0.5 mg Oral QHS   pravastatin  20 mg Oral Daily   pyridostigmine  60 mg Oral TID   sacubitril-valsartan  1 tablet Oral BID   terbinafine  250 mg Oral Daily   tiotropium  18 mcg Inhalation Daily   vitamin B-12  1,000 mcg Oral Daily   Infusions:   cefTRIAXone (ROCEPHIN)  IV Stopped (06/10/22 2129)   heparin 1,550 Units/hr (06/11/22 6967)   Last dose of apixaban 06/09/22 @ 0826  Assessment: 76 yo male on apixaban PTA for hx PE./afib. TRansition to heparin drip to facilitate placement of PLEX catheter for MG flare Hgb 13.3  plt 158  aPTT 28  HL=0.92   Goal of Therapy:  Heparin level 0.3-0.7 units/ml aPTT 66-102 seconds Monitor platelets by anticoagulation protocol: Yes  7/01  0519 aPTT 47, subtherapeutic 7/01 1442 aPTT 55, HL=0.57,  subtherapeutic, not correlating,will increase from 1400 units/hr to 1650 units/hr 7/01 2209 aPTT 98, therapeutic x 1 7/02 0455 aPTT 109 slightly supratherapeutic; HL 0.78 7/02 1400 aPTT 74, HL=0.69, therapeutic and correlating, cont at 1550 units/hr    Plan:  7/02 1400 aPTT 74, HL=0.69, therapeutic and correlating -continue heparin infusion at 1550 units/hr -check HL going forward- next with am labs -CBC daily while on heparin  Chinita Greenland PharmD Clinical Pharmacist 06/11/2022

## 2022-06-12 DIAGNOSIS — G7000 Myasthenia gravis without (acute) exacerbation: Secondary | ICD-10-CM | POA: Diagnosis not present

## 2022-06-12 DIAGNOSIS — J9601 Acute respiratory failure with hypoxia: Secondary | ICD-10-CM | POA: Diagnosis not present

## 2022-06-12 DIAGNOSIS — I5043 Acute on chronic combined systolic (congestive) and diastolic (congestive) heart failure: Secondary | ICD-10-CM | POA: Diagnosis not present

## 2022-06-12 LAB — BASIC METABOLIC PANEL
Anion gap: 6 (ref 5–15)
BUN: 22 mg/dL (ref 8–23)
CO2: 41 mmol/L — ABNORMAL HIGH (ref 22–32)
Calcium: 7.7 mg/dL — ABNORMAL LOW (ref 8.9–10.3)
Chloride: 93 mmol/L — ABNORMAL LOW (ref 98–111)
Creatinine, Ser: 0.84 mg/dL (ref 0.61–1.24)
GFR, Estimated: >60 mL/min (ref >60)
Glucose, Bld: 111 mg/dL — ABNORMAL HIGH (ref 70–99)
Potassium: 3.5 mmol/L (ref 3.5–5.1)
Sodium: 140 mmol/L (ref 135–145)

## 2022-06-12 LAB — CBC
HCT: 43.7 % (ref 39.0–52.0)
Hemoglobin: 13 g/dL (ref 13.0–17.0)
MCH: 29.1 pg (ref 26.0–34.0)
MCHC: 29.7 g/dL — ABNORMAL LOW (ref 30.0–36.0)
MCV: 97.8 fL (ref 80.0–100.0)
Platelets: 172 10*3/uL (ref 150–400)
RBC: 4.47 MIL/uL (ref 4.22–5.81)
RDW: 15.9 % — ABNORMAL HIGH (ref 11.5–15.5)
WBC: 15.7 10*3/uL — ABNORMAL HIGH (ref 4.0–10.5)
nRBC: 0 % (ref 0.0–0.2)

## 2022-06-12 LAB — HEPARIN LEVEL (UNFRACTIONATED): Heparin Unfractionated: 0.66 IU/mL (ref 0.30–0.70)

## 2022-06-12 LAB — GLUCOSE, CAPILLARY
Glucose-Capillary: 251 mg/dL — ABNORMAL HIGH (ref 70–99)
Glucose-Capillary: 235 mg/dL — ABNORMAL HIGH (ref 70–99)
Glucose-Capillary: 273 mg/dL — ABNORMAL HIGH (ref 70–99)
Glucose-Capillary: 153 mg/dL — ABNORMAL HIGH (ref 70–99)

## 2022-06-12 MED ORDER — PREDNISONE 50 MG PO TABS: 50.0000 mg | ORAL_TABLET | Freq: Every day | ORAL | Status: DC

## 2022-06-12 MED ORDER — INSULIN ASPART 100 UNIT/ML IJ SOLN
4.0000 [IU] | Freq: Three times a day (TID) | INTRAMUSCULAR | Status: DC
  Administered 2022-06-13 (×2): 4 [IU] via SUBCUTANEOUS
  Filled 2022-06-12 (×2): qty 1

## 2022-06-12 MED ORDER — PREDNISONE 20 MG PO TABS: 30.0000 mg | ORAL_TABLET | Freq: Every day | ORAL | Status: DC

## 2022-06-12 MED ORDER — PREDNISONE 20 MG PO TABS
40.0000 mg | ORAL_TABLET | Freq: Every day | ORAL | Status: DC
Start: 2022-06-27 — End: 2022-06-13

## 2022-06-12 MED ORDER — PREDNISONE 10 MG PO TABS
60.0000 mg | ORAL_TABLET | Freq: Every day | ORAL | Status: DC
  Administered 2022-06-13: 60 mg via ORAL
  Filled 2022-06-12: qty 1

## 2022-06-12 NOTE — Inpatient Diabetes Management (Signed)
Inpatient Diabetes Program Recommendations  AACE/ADA: New Consensus Statement on Inpatient Glycemic Control (2015)  Target Ranges:  Prepandial:   less than 140 mg/dL      Peak postprandial:   less than 180 mg/dL (1-2 hours)      Critically ill patients:  140 - 180 mg/dL    Latest Reference Range & Units 04/30/22 05:46  Hemoglobin A1C 4.8 - 5.6 % 6.9 (H)  (H): Data is abnormally high  Latest Reference Range & Units 06/11/22 08:00 06/11/22 11:58 06/11/22 15:57 06/11/22 20:36  Glucose-Capillary 70 - 99 mg/dL 145 (H)  2 units Novolog  250 (H)  5 units Novolog  315 (H)  11 units Novolog  172 (H)  3 units Novolog     Latest Reference Range & Units 06/12/22 07:25 06/12/22 11:41  Glucose-Capillary 70 - 99 mg/dL 251 (H)  8 units Novolog  235 (H)  5 units Novolog      Admit with: Acute on chronic HFrEF (heart failure with reduced ejection fraction)/ COPD/ Myasthenia gravis  History: DM, COPD, CHF  Home DM Meds: Jardiance 25 mg daily  Current Orders: Novolog Moderate Correction Scale/ SSI (0-15 units) TID AC + HS      Jardiance 25 mg daily    MD- Note Solumedrol stopped (last dose on board this AM)  Will start Prednisone daily tomorrow (07/04)  MD- Please consider adding Novolog Meal Coverage while pt getting Steroids  Novolog 4 units TID with meals HOLD if pt eats <50% meals    --Will follow patient during hospitalization--  Wyn Quaker RN, MSN, CDE Diabetes Coordinator Inpatient Glycemic Control Team Team Pager: [phone] (8a-5p)

## 2022-06-12 NOTE — Progress Notes (Signed)
[place] for heparin drip Indication:  transition off apixaban for procedure /hx PE/afib  Allergies  Allergen Reactions   Lisinopril Swelling    Patient Measurements: Height: '5\' 6"'$  (167.6 cm) Weight: 103 kg (227 lb 1.2 oz) IBW/kg (Calculated) : 63.8 Heparin Dosing Weight: 87 kg  Vital Signs: Temp: 98.6 F (37 C) (07/03 0348) Temp Source: Oral (07/02 2359) BP: 129/77 (07/03 0541) Pulse Rate: 71 (07/03 0541)  Labs: Recent Labs    06/10/22 0519 06/10/22 1442 06/10/22 2209 06/11/22 0455 06/11/22 1400 06/12/22 0551  HGB 12.5*  --   --  12.6*  --  13.0  HCT 42.5  --   --  41.9  --  43.7  PLT 166  --   --  162  --  172  APTT 47*   < > 98* 109* 74*  --   HEPARINUNFRC  --    < >  --  0.78* 0.69 0.66  CREATININE  --   --   --  0.80  --  0.84   < > = values in this interval not displayed.     Estimated Creatinine Clearance: 85.4 mL/min (by C-G formula based on SCr of 0.84 mg/dL).   Medical History: Past Medical History:  Diagnosis Date   Arthritis    COPD (chronic obstructive pulmonary disease) ([place])    Diabetes mellitus without complication (HCC)    diet controlled   Dyspnea    with activity   Hyperlipidemia    Hypertension    Myasthenia gravis ([place])    Oxygen deficit    Ventricular tachycardia (HCC)     Medications:  Scheduled:   cholecalciferol  2,000 Units Oral Daily   digoxin  0.125 mg Oral Daily   empagliflozin  25 mg Oral Daily   furosemide  40 mg Intravenous Q12H   HYDROcodone-acetaminophen  1 tablet Oral Q6H   insulin aspart  0-15 Units Subcutaneous TID AC & HS   ipratropium-albuterol  3 mL Nebulization TID   methylPREDNISolone (SOLU-MEDROL) injection  80 mg Intravenous Q24H   OLANZapine zydis  5 mg Oral BID   pantoprazole  40 mg Oral Daily   pramipexole  0.5 mg Oral QHS   pravastatin  20 mg Oral Daily   pyridostigmine  60 mg Oral TID   sacubitril-valsartan  1 tablet Oral BID   terbinafine  250 mg Oral  Daily   tiotropium  18 mcg Inhalation Daily   vitamin B-12  1,000 mcg Oral Daily   Infusions:   cefTRIAXone (ROCEPHIN)  IV Stopped (06/11/22 2103)   heparin 1,550 Units/hr (06/11/22 2039)   Last dose of apixaban 06/09/22 @ 0826  Assessment: 76 yo male on apixaban PTA for hx PE./afib. TRansition to heparin drip to facilitate placement of PLEX catheter for MG flare Hgb 13.3  plt 158  aPTT 28  HL=0.92   Goal of Therapy:  Heparin level 0.3-0.7 units/ml aPTT 66-102 seconds Monitor platelets by anticoagulation protocol: Yes  7/01 0519 aPTT 47, subtherapeutic 7/01 1442 aPTT 55, HL=0.57,  subtherapeutic, not correlating,will increase from 1400 units/hr to 1650 units/hr 7/01 2209 aPTT 98, therapeutic x 1 7/02 0455 aPTT 109 slightly supratherapeutic; HL 0.78 7/02 1400 aPTT 74, HL=0.69, therapeutic and correlating, cont at 1550 units/hr 7/03 0551 HL 0.66, therapeutic x 2    Plan:  -Continue heparin infusion at 1550 units/hr -Check HL daily w/ AM labs while therapeutic -CBC daily while on heparin  Renda Rolls, PharmD, [facility] 06/12/2022 6:43  AM        

## 2022-06-12 NOTE — Progress Notes (Signed)
[facility] Progress Note  Patient: Joseph Macdonald    GBM:[id number]  DOA: 06/08/2022    Date of Service: the patient was seen and examined on 06/12/2022  Brief hospital course: 76 year old male with past medical history of COPD, diabetes mellitus, diastolic heart failure, paroxysmal A-fib and pulmonary embolus presented to the emergency room on 6/29 with complaints of cough and shortness of breath x4 days.  Patient had a recent hospitalization and was discharged on 6/8 for cardiac arrest for which she was intubated and then treated for heart failure and then discharged to skilled nursing.  Patient had just been discharged from skilled nursing and been at home for a week.  Patient found to be hypoxic and was admitted to the hospitalist service for CHF and COPD exacerbation.  Following admission, patient reported issues with leg weakness and speech difficulty and possibly swallowing, concerning for myasthenia flare.  During recent hospitalization, patient was treated with IVIG.  Neurology consulted and given findings of hypercarbia as well as diplopia and ptosis plus NIF of -20, initial plan was for transfer to [facility] for plasma exchange.  Due to bed availability, patient unable to be transferred on 6/30.  In the meantime, received increased dose of pyridostigmine.  Weakness symptoms improved.  Assessment and Plan: Assessment and Plan: Myasthenia gravis ([place]) Following admission, patient reported issues with leg weakness and speech difficulty and possibly swallowing, concerning for myasthenia flare.  During recent hospitalization, patient was treated with IVIG.  Neurology consulted and given findings of hypercarbia as well as diplopia and ptosis plus NIF of -20, and initial plan was for transfer to facility for plasma exchange.  This was delayed due to bed availability.  However, patient responded to increased dosing of pyridostigmine and now doing much better.  Appreciate neurology help.   Continue pyridostigmine at higher dose.  - Slow taper of prednisone once COPD exacerbation treatment complete, taper to 60 mg daily for 1 week, then 50 mg daily for 1 week, then 40 mg daily for 1 week and then 30 mg daily and hold until neurology follow-up..    Continue following NIF/FVC every 4 hours.  Avoid medications that could make myasthenia worse so metoprolol discontinued.  Neurology feels immediate crisis has passed  Acute respiratory failure with hypoxia and hypercarbia ([place]) Initially recommended BiPAP though the patient refused. Is normally on 2 L at night and initially presented on 4 L.  Secondary to both COPD and CHF.  Oxygenation has improved and now trying to wean off altogether  Acute on chronic combined systolic and diastolic CHF (congestive heart failure) ([place])  - He had a recent 2D echo that revealed an EF of 35 to 40% and grade 1 diastolic dysfunction on 0/22/3361. To date has diuresed over 14 L and is almost -11.5 L deficient.  Have weaned him down to 1 L and are trialing him on room air. - We will continue Entresto, holding the blood pressure soft.  Still some orthopnea.  COPD with acute exacerbation ([place]) - We will continue steroid therapy with IV Solu-Medrol and bronchodilator therapy with DuoNebs 4 times daily and every 4 hours as needed as well as antibiotic therapy with IV Rocephin.  Steroid taper. - We will hold off Symbicort.  Paroxysmal atrial fibrillation (HCC) - We will continue Eliquis, Toprol-XL and digoxin.  Chronic pain Patient normally on Percocet 5/325 every 6 hours scheduled.  Initially this was not on his med rec as wife forgot to mention it and he gets this  medicine from a different pharmacy than the [place]. Restarted.Marland Kitchen  Dyslipidemia - We will continue statin therapy.  Morbid obesity ([place]) Meets criteria BMI greater than 35 & comorbidities of hypertension and heart failure       Body mass index is 36.65 kg/m.    Pressure Injury Buttocks Right  Stage 2 -  Partial thickness loss of dermis presenting as a shallow open injury with a red, pink wound bed without slough. (Active)     Location: Buttocks  Location Orientation: Right  Staging: Stage 2 -  Partial thickness loss of dermis presenting as a shallow open injury with a red, pink wound bed without slough.  Wound Description (Comments):   Present on Admission: No  Dressing Type Foam - Lift dressing to assess site every shift 05/17/22 0745     Consultants: Neurology  Procedures: None  Antimicrobials: None  Code Status: Full code   Subjective: Breathing continues to get better.  Objective: Vital signs were reviewed and unremarkable. Vitals:   06/12/22 1153 06/12/22 1732  BP:  (!) 142/69  Pulse:  77  Resp:  20  Temp:  98.2 F (36.8 C)  SpO2: 90% 90%    Intake/Output Summary (Last 24 hours) at 06/12/2022 1947 Last data filed at 06/12/2022 1746 Gross per 24 hour  Intake 99.2 ml  Output 3650 ml  Net -3550.8 ml    Filed Weights   06/10/22 0420 06/11/22 0428 06/12/22 0445  Weight: 102.4 kg 103.2 kg 103 kg   Body mass index is 36.65 kg/m.  Exam:  General: Alert and oriented x3, no acute distress HEENT: Normocephalic, atraumatic, mucous membranes are slightly dry, narrow airway Cardiovascular: Regular rate and rhythm, S1-S2 Respiratory: Decreased breath sounds bibasilar, much better airway exchange Abdomen: Soft, obese, nontender, positive bowel sounds Musculoskeletal: No clubbing or cyanosis, 1+ pitting edema from the knees down bilaterally, left greater than right Skin: Chronic venous stasis Psychiatry: Appropriate, no evidence of psychoses Neurology: Neurological exam looks to be improved.  Minimal if any ptosis.  Data Reviewed: CBG slightly trending upward.  Disposition:  Status is: Inpatient Remains inpatient appropriate because: Further diuresis, improvement in oxygenation, continue treatment of myasthenia    Anticipated discharge date: 7/4 if  patient amenable to going home.  PT recommended skilled nursing  Family Communication: Wife at the bedside DVT Prophylaxis:   Heparin    Author: Annita Brod ,MD 06/12/2022 7:47 PM  To reach On-call, see care teams to locate the attending and reach out via [email or web address]. Between 7PM-7AM, please contact night-coverage If you still have difficulty reaching the attending provider, please page the [facility] (Director on Call) for [facility] on amion for assistance.

## 2022-06-12 NOTE — Progress Notes (Signed)
NIF -35  

## 2022-06-12 NOTE — Evaluation (Addendum)
Physical Therapy Evaluation Patient Details Name: Joseph Macdonald MRN: [id number] DOB: 11/11/1946 Today's Date: 06/12/2022  History of Present Illness  Pt is a 76 y/o M admitted on 06/08/22 after presenting to the ED with c/o cough & SOB x 4 days. Pt had recent hospitalization on 6/8 for cardiac arrest for which he was intubated, tx for heart failure & then d/c to SNF. Pt was found to be hypoxic & admitted for tx of CHF & COPD exacerbation. PMH: COPD, DM, diastolic heart failure, paroxysmal a-fib, PE, arthritis, HLD, myasthenia gravis  Clinical Impression  Pt seen for PT evaluation with pt reporting he had returned home from SNF for ~3 days & was ambulatory with a rollator. Pt also endorses BLE knee pain with mobility, reporting it started after he had a fall at rehab. Pt is able to complete bed mobility with supervision with [facility] elevated & bed rails, STS with supervision but poor awareness of hand placement & posterior lean on EOB. Pt ambulates with RW &  supervision<>CGA & cuing for safety but is limited in distance by BLE knee pain. Pt reports he's unsure if wife can assist him at home & is open to returning to rehab. Will continue to follow pt acutely to address balance, endurance, gait with LRAD, and single step negotiation as he has 1 step to enter his home.   Pt received on 0.5L/min via nasal cannula with SpO2 >/= 90% Pt placed on room air & maintained >/= 90% until he returned to semi fowler in bed when he dropped to 87% Pt placed back on 1L/min via nasal cannula with SpO2 >/= 90% Max HR 127 bpm   Recommendations for follow up therapy are one component of a multi-disciplinary discharge planning process, led by the attending physician.  Recommendations may be updated based on patient status, additional functional criteria and insurance authorization.  Follow Up Recommendations Skilled nursing-short term rehab (<3 hours/day) Can patient physically be transported by private vehicle: Yes     Assistance Recommended at Discharge Intermittent Supervision/Assistance  Patient can return home with the following  A little help with walking and/or transfers;A little help with bathing/dressing/bathroom;Assistance with cooking/housework;Assist for transportation;Help with stairs or ramp for entrance    Equipment Recommendations None recommended by PT  Recommendations for Other Services  OT consult    Functional Status Assessment Patient has had a recent decline in their functional status and demonstrates the ability to make significant improvements in function in a reasonable and predictable amount of time.     Precautions / Restrictions Precautions Precautions: Fall Restrictions Weight Bearing Restrictions: No      Mobility  Bed Mobility Overal bed mobility: Needs Assistance Bed Mobility: Supine to Sit, Sit to Supine     Supine to sit: Supervision, HOB elevated Sit to supine: HOB elevated, Supervision        Transfers Overall transfer level: Needs assistance Equipment used: Rolling walker (2 wheels) Transfers: Sit to/from Stand Sit to Stand: Supervision           General transfer comment: Poor awareness of safe hand placement, posterior lean on EOB    Ambulation/Gait Ambulation/Gait assistance: Supervision, Min guard Gait Distance (Feet): 12 Feet Assistive device: Rolling walker (2 wheels) Gait Pattern/deviations: Decreased step length - right, Decreased step length - left, Decreased stride length, Decreased dorsiflexion - right, Decreased dorsiflexion - left, Trunk flexed Gait velocity: decreased     General Gait Details: Pushing RW slightly out in front, slightly impulsive, requires cuing to slow  down to allow PT to manage IV pole  Stairs            Wheelchair Mobility    Modified Rankin (Stroke Patients Only)       Balance Overall balance assessment: Needs assistance Sitting-balance support: Feet supported Sitting balance-Leahy Scale:  Good     Standing balance support: Bilateral upper extremity supported, During functional activity, Reliant on assistive device for balance Standing balance-Leahy Scale: Fair                               Pertinent Vitals/Pain Pain Assessment Pain Assessment: Faces Faces Pain Scale: Hurts whole lot Pain Location: B knees Pain Descriptors / Indicators: Discomfort Pain Intervention(s): Monitored during session, Repositioned, Limited activity within patient's tolerance    Home Living Family/patient expects to be discharged to:: Private residence Living Arrangements: Spouse/significant other Available Help at Discharge: Family;Available PRN/intermittently Type of Home: House Home Access: Stairs to enter Entrance Stairs-Rails: None Entrance Stairs-Number of Steps: 1   Home Layout: One level Home Equipment: Grab bars - tub/shower;Hand held shower head;Grab bars - toilet;Rolling Walker (2 wheels);Rollator (4 wheels);Shower seat - built in;Cane - quad      Prior Function Prior Level of Function : Independent/Modified Independent             Mobility Comments: Pt reports he had returned home from SNF for ~3 days & was ambulatory with rollator.       Hand Dominance        Extremity/Trunk Assessment   Upper Extremity Assessment Upper Extremity Assessment: Generalized weakness    Lower Extremity Assessment Lower Extremity Assessment: Generalized weakness (Pt c/o B knee pain with mobility that began after a fall at rehab. BLE redness)       Communication   Communication: No difficulties  Cognition Arousal/Alertness: Awake/alert Behavior During Therapy: WFL for tasks assessed/performed Overall Cognitive Status: Within Functional Limits for tasks assessed                                          General Comments      Exercises     Assessment/Plan    PT Assessment Patient needs continued PT services  PT Problem List Decreased  strength;Decreased activity tolerance;Decreased balance;Decreased mobility;Decreased knowledge of use of DME;Pain;Cardiopulmonary status limiting activity;Decreased safety awareness;Decreased knowledge of precautions       PT Treatment Interventions DME instruction;Therapeutic exercise;Balance training;Gait training;Stair training;Neuromuscular re-education;Modalities;Manual techniques;Functional mobility training;Therapeutic activities;Patient/family education    PT Goals (Current goals can be found in the Care Plan section)  Acute Rehab PT Goals Patient Stated Goal: get better PT Goal Formulation: With patient Time For Goal Achievement: 06/26/22 Potential to Achieve Goals: Good    Frequency Min 2X/week     Co-evaluation               AM-PAC PT "6 Clicks" Mobility  Outcome Measure Help needed turning from your back to your side while in a flat bed without using bedrails?: None Help needed moving from lying on your back to sitting on the side of a flat bed without using bedrails?: A Little Help needed moving to and from a bed to a chair (including a wheelchair)?: A Little Help needed standing up from a chair using your arms (e.g., wheelchair or bedside chair)?: A Little Help needed to walk in hospital room?:  A Little Help needed climbing 3-5 steps with a railing? : A Lot 6 Click Score: 18    End of Session Equipment Utilized During Treatment: Oxygen Activity Tolerance: Patient limited by fatigue;Patient limited by pain Patient left: in bed;with call bell/phone within reach;with bed alarm set (HOB elevated) Nurse Communication: Mobility status (O2) PT Visit Diagnosis: Muscle weakness (generalized) (M62.81);Difficulty in walking, not elsewhere classified (R26.2);Unsteadiness on feet (R26.81);Pain Pain - Right/Left:  (bilateral) Pain - part of body: Knee    Time: 6861-6837 PT Time Calculation (min) (ACUTE ONLY): 16 min   Charges:   PT Evaluation $PT Eval Moderate  Complexity: Lyman, PT, DPT 06/12/22, 4:24 PM   Waunita Schooner 06/12/2022, 4:21 PM

## 2022-06-12 NOTE — Progress Notes (Signed)
NIF - 26  FVC 0.82 FEV1 0.67 FEV1/FVC 81.4

## 2022-06-12 NOTE — Care Management Important Message (Signed)
Important Message  Patient Details  Name: STANISLAW ACTON MRN: [id number] Date of Birth: 1946-05-04   Medicare Important Message Given:  Yes     Dannette Barbara 06/12/2022, 11:58 AM

## 2022-06-13 ENCOUNTER — Encounter: Payer: Self-pay | Admitting: Family Medicine

## 2022-06-13 DIAGNOSIS — I5043 Acute on chronic combined systolic (congestive) and diastolic (congestive) heart failure: Secondary | ICD-10-CM | POA: Diagnosis not present

## 2022-06-13 DIAGNOSIS — G7000 Myasthenia gravis without (acute) exacerbation: Secondary | ICD-10-CM | POA: Diagnosis not present

## 2022-06-13 DIAGNOSIS — J9601 Acute respiratory failure with hypoxia: Secondary | ICD-10-CM | POA: Diagnosis not present

## 2022-06-13 LAB — HEPARIN LEVEL (UNFRACTIONATED): Heparin Unfractionated: 0.55 IU/mL (ref 0.30–0.70)

## 2022-06-13 LAB — CBC
HCT: 45.6 % (ref 39.0–52.0)
Hemoglobin: 13.8 g/dL (ref 13.0–17.0)
MCH: 29.4 pg (ref 26.0–34.0)
MCHC: 30.3 g/dL (ref 30.0–36.0)
MCV: 97 fL (ref 80.0–100.0)
Platelets: 175 10*3/uL (ref 150–400)
RBC: 4.7 MIL/uL (ref 4.22–5.81)
RDW: 15.8 % — ABNORMAL HIGH (ref 11.5–15.5)
WBC: 17.3 10*3/uL — ABNORMAL HIGH (ref 4.0–10.5)
nRBC: 0 % (ref 0.0–0.2)

## 2022-06-13 LAB — METHYLMALONIC ACID, SERUM: Methylmalonic Acid, Quantitative: 287 nmol/L (ref 0–378)

## 2022-06-13 LAB — BASIC METABOLIC PANEL
Anion gap: 11 (ref 5–15)
BUN: 22 mg/dL (ref 8–23)
CO2: 39 mmol/L — ABNORMAL HIGH (ref 22–32)
Calcium: 8.2 mg/dL — ABNORMAL LOW (ref 8.9–10.3)
Chloride: 91 mmol/L — ABNORMAL LOW (ref 98–111)
Creatinine, Ser: 0.81 mg/dL (ref 0.61–1.24)
GFR, Estimated: >60 mL/min (ref >60)
Glucose, Bld: 105 mg/dL — ABNORMAL HIGH (ref 70–99)
Potassium: 3.1 mmol/L — ABNORMAL LOW (ref 3.5–5.1)
Sodium: 141 mmol/L (ref 135–145)

## 2022-06-13 LAB — CULTURE, BLOOD (ROUTINE X 2)
Culture: NO GROWTH
Culture: NO GROWTH
Special Requests: ADEQUATE

## 2022-06-13 LAB — GLUCOSE, CAPILLARY
Glucose-Capillary: 121 mg/dL — ABNORMAL HIGH (ref 70–99)
Glucose-Capillary: 227 mg/dL — ABNORMAL HIGH (ref 70–99)

## 2022-06-13 LAB — ACETYLCHOLINE RECEPTOR, BINDING: Acety choline binding ab: 2.76 nmol/L — ABNORMAL HIGH (ref 0.00–0.24)

## 2022-06-13 MED ORDER — PYRIDOSTIGMINE BROMIDE 60 MG PO TABS: 60.0000 mg | ORAL_TABLET | Freq: Three times a day (TID) | ORAL | 3 refills | Status: AC

## 2022-06-13 MED ORDER — PREDNISONE 10 MG PO TABS
ORAL_TABLET | ORAL | 0 refills | Status: AC
Start: 2022-06-14 — End: 2022-08-03

## 2022-06-13 NOTE — TOC Transition Note (Signed)
Transition of Care [facility]) - CM/SW Discharge Note   Patient Details  Name: Joseph Macdonald MRN: [id number] Date of Birth: 11/22/1946  Transition of [facility] CM/SW Contact:  Laurena Slimmer, RN Phone Number: 06/13/2022, 2:13 PM   Clinical Narrative:    Spoke with patient regarding discharge home today. Patient's wife is at his bedisde and will transport him home. Patient advised [facility] was contacted. TOC signing off.          Patient Goals and CMS Choice        Discharge Placement                       Discharge Plan and Services                                     Social Determinants of Health (SDOH) Interventions     Readmission Risk Interventions    06/09/2022   12:53 PM  Readmission Risk Prevention Plan  Transportation Screening Complete  Medication Review ([place]) Complete  PCP or Specialist appointment within 3-5 days of discharge Complete  Skilled [place] Complete

## 2022-06-13 NOTE — Evaluation (Signed)
Occupational Therapy Evaluation Patient Details Name: Joseph Macdonald MRN: [id number] DOB: 1946-01-18 Today's Date: 06/13/2022   History of Present Illness Pt is a 76 y/o M admitted on 06/08/22 after presenting to the ED with c/o cough & SOB x 4 days. Pt had recent hospitalization on 6/8 for cardiac arrest for which he was intubated, tx for heart failure & then d/c to SNF. Pt was found to be hypoxic & admitted for tx of CHF & COPD exacerbation. PMH: COPD, DM, diastolic heart failure, paroxysmal a-fib, PE, arthritis, HLD, myasthenia gravis.   Clinical Impression   Pt was seen for OT evaluation this date. Prior to hospital admission, pt was only recently home from SNF per chart. Pt lives with his spouse who manages medications and provides PRN assist for LB ADL. Pt generally mod indep with basic ADL. Pt reports a neighbor prepares meals for them and they hire someone to clean 2x/mo. Denies falls in past 74mo Currently pt demonstrates impairments in balance, strength, and activity tolerance as described below (See OT problem list) which functionally limit his safety/indep with ADL/self-care tasks. Pt currently requires supv-CGA for ADL transfers and in-room mobility using 2WW, PRN MIN A for LB ADL tasks, and intermittent VC for safety. Pt denies pain or SOB throughout. Pt would benefit from skilled OT services to address noted impairments and functional limitations (see below for any additional details) in order to maximize safety and independence while minimizing falls risk and caregiver burden. Upon hospital discharge, recommend HHOT to maximize pt safety and return to functional independence during meaningful occupations of daily life.     Recommendations for follow up therapy are one component of a multi-disciplinary discharge planning process, led by the attending physician.  Recommendations may be updated based on patient status, additional functional criteria and insurance authorization.   Follow Up  Recommendations  Home health OT    Assistance Recommended at Discharge Frequent or constant Supervision/Assistance  Patient can return home with the following A little help with walking and/or transfers;A little help with bathing/dressing/bathroom;Assistance with cooking/housework;Assist for transportation;Help with stairs or ramp for entrance;Direct supervision/assist for medications management    Functional Status Assessment  Patient has had a recent decline in their functional status and demonstrates the ability to make significant improvements in function in a reasonable and predictable amount of time.  Equipment Recommendations  None recommended by OT    Recommendations for Other Services       Precautions / Restrictions Precautions Precautions: Fall Restrictions Weight Bearing Restrictions: No      Mobility Bed Mobility Overal bed mobility: Needs Assistance Bed Mobility: Supine to Sit     Supine to sit: Supervision, HOB elevated          Transfers Overall transfer level: Needs assistance Equipment used: Rolling walker (2 wheels) Transfers: Sit to/from Stand Sit to Stand: Supervision           General transfer comment: increased effort, unsteady, SBA for safety      Balance Overall balance assessment: Needs assistance Sitting-balance support: Feet supported Sitting balance-Leahy Scale: Good     Standing balance support: Bilateral upper extremity supported, During functional activity, Reliant on assistive device for balance Standing balance-Leahy Scale: Fair                             ADL either performed or assessed with clinical judgement   ADL Overall ADL's : Needs assistance/impaired Eating/Feeding: Independent  Toilet Transfer: Rolling walker (2 wheels);BSC/3in1;Ambulation;Min guard           Functional mobility during ADLs: Min guard;Supervision/safety;Rolling walker (2 wheels)       Vision          Perception     Praxis      Pertinent Vitals/Pain Pain Assessment Pain Assessment: No/denies pain     Hand Dominance     Extremity/Trunk Assessment Upper Extremity Assessment Upper Extremity Assessment: Generalized weakness   Lower Extremity Assessment Lower Extremity Assessment: Generalized weakness   Cervical / Trunk Assessment Cervical / Trunk Assessment: Normal   Communication Communication Communication: No difficulties   Cognition Arousal/Alertness: Awake/alert Behavior During Therapy: WFL for tasks assessed/performed Overall Cognitive Status: Within Functional Limits for tasks assessed                                       General Comments       Exercises     Shoulder Instructions      Home Living Family/patient expects to be discharged to:: Private residence Living Arrangements: Spouse/significant other Available Help at Discharge: Family;Available PRN/intermittently Type of Home: House Home Access: Stairs to enter Entrance Stairs-Number of Steps: 1 Entrance Stairs-Rails: None Home Layout: One level     Bathroom Shower/Tub: Occupational psychologist: Handicapped height     Home Equipment: Grab bars - tub/shower;Hand held shower head;Grab bars - toilet;Rolling Walker (2 wheels);Rollator (4 wheels);Shower seat - built in;Cane - quad          Prior Functioning/Environment Prior Level of Function : Independent/Modified Independent             Mobility Comments: Pt reports he had returned home from SNF for ~3 days & was ambulatory with rollator. ADLs Comments: Pt endorses at baseline indep with bathing, dressing, toileting, PRN Assist for LB dressing from wife, wife manages medications, they pay someone to clean and a neighbor cooks, pt denies falls in past 36mo       OT Problem List: Decreased strength;Decreased activity tolerance;Impaired balance (sitting and/or standing);Decreased knowledge of  precautions;Decreased knowledge of use of DME or AE;Decreased safety awareness      OT Treatment/Interventions: Self-care/ADL training;Energy conservation;DME and/or AE instruction;Therapeutic activities;Patient/family education;Balance training;Therapeutic exercise    OT Goals(Current goals can be found in the care plan section) Acute Rehab OT Goals Patient Stated Goal: get stronger and go home OT Goal Formulation: With patient Time For Goal Achievement: 06/27/22 Potential to Achieve Goals: Good ADL Goals Pt Will Perform Lower Body Dressing: with modified independence;sit to/from stand Pt Will Transfer to Toilet: with supervision;ambulating (elevated commode, LRAD) Pt Will Perform Toileting - Clothing Manipulation and hygiene: with modified independence Additional ADL Goal #1: Pt will complete all aspects of bathing with remote supervision, primarily from seated position. Additional ADL Goal #2: Pt will verbalize plan to implement at least 2 learned ECS to maximize safety/indep.  OT Frequency: Min 2X/week    Co-evaluation              AM-PAC OT "6 Clicks" Daily Activity     Outcome Measure Help from another person eating meals?: None Help from another person taking care of personal grooming?: A Little Help from another person toileting, which includes using toliet, bedpan, or urinal?: A Little Help from another person bathing (including washing, rinsing, drying)?: A Little Help from another person to put on and taking  off regular upper body clothing?: A Little Help from another person to put on and taking off regular lower body clothing?: A Little 6 Click Score: 19   End of Session Nurse Communication: Mobility status;Other (comment) (NT - linens change)  Activity Tolerance: Patient tolerated treatment well Patient left: in chair;with call bell/phone within reach;with chair alarm set  OT Visit Diagnosis: Unsteadiness on feet (R26.81);Muscle weakness (generalized) (M62.81)                 Time: 0063-4949 OT Time Calculation (min): 14 min Charges:  OT General Charges $OT Visit: 1 Visit OT Evaluation $OT Eval Moderate Complexity: 1 Mod  Ardeth Perfect., MPH, MS, OTR/L ascom [phone] 06/13/22, 10:21 AM

## 2022-06-13 NOTE — Discharge Summary (Signed)
Physician Discharge Summary   Patient: Joseph Macdonald MRN: [id number] DOB: Jun 28, 1946  Admit date:     06/08/2022  Discharge date: {dischdate:26783}  Discharge Physician: Annita Brod   PCP: [name], MD   Recommendations at discharge:  {Tip this will not be part of the note when signed- Example include specific recommendations for outpatient follow-up, pending tests to follow-up on. (Optional):26781}  ***  Discharge Diagnoses: Active Problems:   Myasthenia gravis ([place])   Acute on chronic combined systolic and diastolic CHF (congestive heart failure) (HCC)   Acute respiratory failure with hypoxia and hypercarbia (HCC)   COPD with acute exacerbation (HCC)   Paroxysmal atrial fibrillation (HCC)   Chronic pain   Dyslipidemia   Morbid obesity ([place])  Resolved Problems:   * No resolved hospital problems. *  Hospital Course: 76 year old male with past medical history of COPD, diabetes mellitus, diastolic heart failure, paroxysmal A-fib and pulmonary embolus presented to the emergency room on 6/29 with complaints of cough and shortness of breath x4 days.  Patient had a recent hospitalization and was discharged on 6/8 for cardiac arrest for which she was intubated and then treated for heart failure and then discharged to skilled nursing.  Patient had just been discharged from skilled nursing and been at home for a week.  Patient found to be hypoxic and was admitted to the hospitalist service for CHF and COPD exacerbation.  Following admission, patient reported issues with leg weakness and speech difficulty and possibly swallowing, concerning for myasthenia flare.  During recent hospitalization, patient was treated with IVIG.  Neurology consulted and given findings of hypercarbia as well as diplopia and ptosis plus NIF of -20, initial plan was for transfer to [facility] for plasma exchange.  Due to bed availability, patient unable to be transferred on 6/30.  In the meantime,  received increased dose of pyridostigmine.  Weakness symptoms improved.  Assessment and Plan: Myasthenia gravis [facility]) Following admission, patient reported issues with leg weakness and speech difficulty and possibly swallowing, concerning for myasthenia flare.  During recent hospitalization, patient was treated with IVIG.  Neurology consulted and given findings of hypercarbia as well as diplopia and ptosis plus NIF of -20, and initial plan was for transfer to facility for plasma exchange.  This was delayed due to bed availability.  However, patient responded to increased dosing of pyridostigmine and now doing much better.  Appreciate neurology help.  Continue pyridostigmine at higher dose.  - Slow taper of prednisone once COPD exacerbation treatment complete, taper to 60 mg daily for 1 week, then 50 mg daily for 1 week, then 40 mg daily for 1 week and then 30 mg daily and hold until neurology follow-up..    Continue following NIF/FVC every 4 hours.  Avoid medications that could make myasthenia worse so metoprolol discontinued.  Neurology feels immediate crisis has passed  Acute respiratory failure with hypoxia and hypercarbia ([place]) Initially recommended BiPAP though the patient refused. Is normally on 2 L at night and initially presented on 4 L.  Secondary to both COPD and CHF.  Oxygenation has improved and now trying to wean off altogether  Acute on chronic combined systolic and diastolic CHF (congestive heart failure) ([place])  - He had a recent 2D echo that revealed an EF of 35 to 40% and grade 1 diastolic dysfunction on 7/74/1287. To date has diuresed over 14 L and is almost -11.5 L deficient.  Have weaned him down to 1 L and are trialing him on  room air. - We will continue Entresto, holding the blood pressure soft.  Still some orthopnea.  COPD with acute exacerbation ([place]) - We will continue steroid therapy with IV Solu-Medrol and bronchodilator therapy with DuoNebs 4 times daily and every 4 hours  as needed as well as antibiotic therapy with IV Rocephin.  Steroid taper. - We will hold off Symbicort.  Paroxysmal atrial fibrillation (HCC) - We will continue Eliquis, Toprol-XL and digoxin.  Chronic pain Patient normally on Percocet 5/325 every 6 hours scheduled.  Initially this was not on his med rec as wife forgot to mention it and he gets this medicine from a different pharmacy than the [place]. Restarted.Marland Kitchen  Dyslipidemia - We will continue statin therapy.  Morbid obesity ([place]) Meets criteria BMI greater than 35 & comorbidities of hypertension and heart failure      {Tip this will not be part of the note when signed Body mass index is 35.85 kg/m. , ,  Active Pressure Injury/Wound(s)     Pressure Ulcer  Duration          Pressure Injury Buttocks Right Stage 2 -  Partial thickness loss of dermis presenting as a shallow open injury with a red, pink wound bed without slough. -- days           (Optional):26781}  {(NOTE) Pain control PDMP Statment (Optional):26782} Consultants: *** Procedures performed: ***  Disposition: {Plan; Disposition:26390} Diet recommendation:  Discharge Diet Orders (From admission, onward)     Start     Ordered   06/13/22 0000  Diet - low sodium heart healthy        06/13/22 1258           {Diet_Plan:26776} DISCHARGE MEDICATION: Allergies as of 06/13/2022       Reactions   Lisinopril Swelling        Medication List     STOP taking these medications    metoprolol succinate 50 MG 24 hr tablet Commonly known as: TOPROL-XL       TAKE these medications    albuterol 108 (90 Base) MCG/ACT inhaler Commonly known as: VENTOLIN HFA Inhale into the lungs every 6 (six) hours as needed for wheezing or shortness of breath.   apixaban 5 MG Tabs tablet Commonly known as: ELIQUIS Take 5 mg by mouth 2 (two) times daily.   budesonide-formoterol 160-4.5 MCG/ACT inhaler Commonly known as: SYMBICORT Inhale 2 puffs into the lungs 2 (two)  times daily.   cholecalciferol 25 MCG (1000 UNIT) tablet Commonly known as: VITAMIN D Take 2,000 Units by mouth daily.   diazepam 5 MG tablet Commonly known as: VALIUM Take 5 mg by mouth every 6 (six) hours as needed for anxiety.   digoxin 0.125 MG tablet Commonly known as: LANOXIN Take 1 tablet (0.125 mg total) by mouth daily.   empagliflozin 25 MG Tabs tablet Commonly known as: JARDIANCE Take 25 mg by mouth daily.   Entresto 24-26 MG Generic drug: sacubitril-valsartan Take 1 tablet by mouth 2 (two) times daily.   flecainide 50 MG tablet Commonly known as: TAMBOCOR Take 50 mg by mouth 2 (two) times daily.   furosemide 40 MG tablet Commonly known as: LASIX Take 1 tablet (40 mg total) by mouth 2 (two) times daily.   OLANZapine zydis 5 MG disintegrating tablet Commonly known as: ZYPREXA Take 1 tablet (5 mg total) by mouth 2 (two) times daily.   omeprazole 40 MG capsule Commonly known as: PRILOSEC Take 40 mg by mouth 2 (two) times daily.  pramipexole 0.5 MG tablet Commonly known as: MIRAPEX Take 1 tablet (0.5 mg total) by mouth at bedtime.   pravastatin 20 MG tablet Commonly known as: PRAVACHOL Take 20 mg by mouth daily.   predniSONE 10 MG tablet Commonly known as: DELTASONE Take 6 tablets (60 mg total) by mouth daily with breakfast for 6 days, THEN 5 tablets (50 mg total) daily with breakfast for 7 days, THEN 4 tablets (40 mg total) daily with breakfast for 7 days, THEN 3 tablets (30 mg total) daily with breakfast. Start taking on: June 14, 2022 What changed:  medication strength See the new instructions.   pyridostigmine 60 MG tablet Commonly known as: MESTINON Take 1 tablet (60 mg total) by mouth 3 (three) times daily. What changed: how much to take   terbinafine 250 MG tablet Commonly known as: LAMISIL Take 250 mg by mouth daily.   tiotropium 18 MCG inhalation capsule Commonly known as: SPIRIVA Place 18 mcg into inhaler and inhale daily.   vitamin  B-12 1000 MCG tablet Commonly known as: CYANOCOBALAMIN Take 1,000 mcg by mouth daily.        Discharge Exam: Filed Weights   06/11/22 0428 06/12/22 0445 06/13/22 0332  Weight: 103.2 kg 103 kg 100.7 kg   ***  Condition at discharge: {DC Condition:26389}  The results of significant diagnostics from this hospitalization (including imaging, microbiology, ancillary and laboratory) are listed below for reference.   Imaging Studies: DG Chest 2 View  Result Date: 06/08/2022 CLINICAL DATA:  Hypoxia, shortness of breath EXAM: CHEST - 2 VIEW COMPARISON:  Previous studies including the examination of 05/13/2022 FINDINGS: Transverse diameter of heart is increased. Central pulmonary vessels are slightly less prominent. There are linear densities in both lower lung fields. There is blunting of both lateral CP angles suggesting small bilateral pleural effusions, more so on the right side. There is no pneumothorax. IMPRESSION: Cardiomegaly. There are no signs of pulmonary edema. There are linear densities in the lower lung fields suggesting subsegmental atelectasis/pneumonia. Small bilateral pleural effusions, more so on the right side. Electronically Signed   By: Elmer Picker M.D.   On: 06/08/2022 15:15   CARDIAC CATHETERIZATION  Result Date: 05/16/2022 Conclusions: Moderate single-vessel coronary artery disease with 60% mid LAD stenosis that is borderline significant by iFR (iFR = 0.90). No angiographically significant coronary artery disease observed in the LCx or RCA. Upper normal to mildly elevated left heart filling pressures (LVEDP and PCWP 18 mmHg). Moderate pulmonary hypertension (mean PAP 34 mmHg, PVR 2.9 WU).  Moderately-severely elevated right heart filling pressure (mean RAP 15 mmHg, RVEDP 14 mmHg). Normal Fick cardiac output/index (CO 5.5 L/min, CI 2.6 L/min/m). Recommendations: Favor medical management of moderate LAD disease that is borderline significant by IFR but supplies a  relatively small territory as the LV apex is perfused by branches from the distal RCA. Secondary prevention of coronary artery disease. Continue gentle diuresis.  Respiratory failure seems to be out of proportion to left heart failure.  Elevated pulmonary artery and right heart pressures are more consistent with primary pulmonary process driving the patient's respiratory failure. Restart IV heparin 2 hours after TR band has been removed.  Transition to [place] as soon as tomorrow if there is no evidence of bleeding/vascular injury at catheterization sites. Nelva Bush, MD [place]   Microbiology: Results for orders placed or performed during the hospital encounter of 06/08/22  Blood culture (routine x 2)     Status: None   Collection Time: 06/08/22  4:14 PM  Specimen: BLOOD  Result Value Ref Range Status   Specimen Description BLOOD BOTTLES DRAWN AEROBIC AND ANAEROBIC  Final   Special Requests LEFT ANTECUBITAL Blood Culture adequate volume  Final   Culture   Final    NO GROWTH 5 DAYS Performed at [facility], [address], [place], [place] [address]    Report Status 06/13/2022 FINAL  Final  SARS Coronavirus 2 by RT PCR (hospital order, performed in [facility] hospital lab) *cepheid single result test* Anterior Nasal Swab     Status: None   Collection Time: 06/08/22  4:14 PM   Specimen: Anterior Nasal Swab  Result Value Ref Range Status   SARS Coronavirus 2 by RT PCR NEGATIVE NEGATIVE Final    Comment: (NOTE) SARS-CoV-2 target nucleic acids are NOT DETECTED.  The SARS-CoV-2 RNA is generally detectable in upper and lower respiratory specimens during the acute phase of infection. The lowest concentration of SARS-CoV-2 viral copies this assay can detect is 250 copies / mL. A negative result does not preclude SARS-CoV-2 infection and should not be used as the sole basis for treatment or other patient management decisions.  A negative result may occur with improper  specimen collection / handling, submission of specimen other than nasopharyngeal swab, presence of viral mutation(s) within the areas targeted by this assay, and inadequate number of viral copies (<250 copies / mL). A negative result must be combined with clinical observations, patient history, and epidemiological information.  Fact Sheet for Patients:   https://www.patel.info/  Fact Sheet for Healthcare Providers: https://hall.com/  This test is not yet approved or  cleared by the [place] FDA and has been authorized for detection and/or diagnosis of SARS-CoV-2 by FDA under an Emergency Use Authorization (EUA).  This EUA will remain in effect (meaning this test can be used) for the duration of the COVID-19 declaration under Section 564(b)(1) of the Act, 21 U.S.C. section 360bbb-3(b)(1), unless the authorization is terminated or revoked sooner.  Performed at [facility], [address] [place], [place] [address]   Blood culture (routine x 2)     Status: None   Collection Time: 06/08/22  5:23 PM   Specimen: BLOOD  Result Value Ref Range Status   Specimen Description BLOOD RAC  Final   Special Requests BOTTLES DRAWN AEROBIC AND ANAEROBIC BCAV  Final   Culture   Final    NO GROWTH 5 DAYS Performed at [facility], [address] [place], [place] [address]    Report Status 06/13/2022 FINAL  Final    Labs: CBC: Recent Labs  Lab 06/09/22 0455 06/10/22 0519 06/11/22 0455 06/12/22 0551 06/13/22 0438  WBC 11.3* 14.7* 15.3* 15.7* 17.3*  HGB 13.3 12.5* 12.6* 13.0 13.8  HCT 45.4 42.5 41.9 43.7 45.6  MCV 101.3* 100.5* 97.2 97.8 97.0  PLT 158 166 162 172 563   Basic Metabolic Panel: Recent Labs  Lab 06/08/22 1436 06/09/22 0455 06/11/22 0455 06/12/22 0551 06/13/22 0438  NA 141 142 137 140 141  K 4.8 4.3 3.0* 3.5 3.1*  CL 98 98 91* 93* 91*  CO2 33* 36* 39* 41* 39*  GLUCOSE 169* 187* 98 111* 105*  BUN  '20 21 22 22 22  '$ CREATININE 0.96 0.84 0.80 0.84 0.81  CALCIUM 7.7* 7.6* 7.2* 7.7* 8.2*   Liver Function Tests: Recent Labs  Lab 06/08/22 1614  AST 18  ALT 17  ALKPHOS 118  BILITOT 0.5  PROT 7.1  ALBUMIN 3.5   CBG: Recent Labs  Lab 06/12/22 1141 06/12/22 1658 06/12/22 2108 06/13/22 0806 06/13/22 1143  GLUCAP 235* 273* 153* 121* 227*    Discharge time spent: {LESS THAN/GREATER THAN:26388} 30 minutes.  Signed: Annita Brod, MD [facility] 06/13/2022

## 2022-06-13 NOTE — Progress Notes (Signed)
New bag of Heparin hung at 15.47m/hr. Verified with MJana Half RN

## 2022-06-13 NOTE — Plan of Care (Signed)
  Problem: Education: Goal: Understanding of CV disease, CV risk reduction, and recovery process will improve Outcome: Adequate for Discharge Goal: Individualized Educational Video(s) Outcome: Adequate for Discharge   Problem: Activity: Goal: Ability to return to baseline activity level will improve Outcome: Adequate for Discharge   Problem: Cardiovascular: Goal: Ability to achieve and maintain adequate cardiovascular perfusion will improve Outcome: Adequate for Discharge Goal: Vascular access site(s) Level 0-1 will be maintained Outcome: Adequate for Discharge   Problem: Health Behavior/Discharge Planning: Goal: Ability to safely manage health-related needs after discharge will improve Outcome: Adequate for Discharge   Problem: Education: Goal: Ability to demonstrate management of disease process will improve Outcome: Adequate for Discharge Goal: Ability to verbalize understanding of medication therapies will improve Outcome: Adequate for Discharge Goal: Individualized Educational Video(s) Outcome: Adequate for Discharge   Problem: Activity: Goal: Capacity to carry out activities will improve Outcome: Adequate for Discharge   Problem: Education: Goal: Ability to describe self-care measures that may prevent or decrease complications (Diabetes Survival Skills Education) will improve Outcome: Adequate for Discharge Goal: Individualized Educational Video(s) Outcome: Adequate for Discharge   Problem: Coping: Goal: Ability to adjust to condition or change in health will improve Outcome: Adequate for Discharge   Problem: Health Behavior/Discharge Planning: Goal: Ability to identify and utilize available resources and services will improve Outcome: Adequate for Discharge Goal: Ability to manage health-related needs will improve Outcome: Adequate for Discharge   Problem: Fluid Volume: Goal: Ability to maintain a balanced intake and output will improve Outcome: Adequate for  Discharge   Problem: Metabolic: Goal: Ability to maintain appropriate glucose levels will improve Outcome: Adequate for Discharge   Problem: Skin Integrity: Goal: Risk for impaired skin integrity will decrease Outcome: Adequate for Discharge   Problem: Tissue Perfusion: Goal: Adequacy of tissue perfusion will improve Outcome: Adequate for Discharge   Problem: Activity: Goal: Risk for activity intolerance will decrease Outcome: Adequate for Discharge   Problem: Clinical Measurements: Goal: Ability to maintain clinical measurements within normal limits will improve Outcome: Adequate for Discharge Goal: Will remain free from infection Outcome: Adequate for Discharge Goal: Diagnostic test results will improve Outcome: Adequate for Discharge Goal: Respiratory complications will improve Outcome: Adequate for Discharge Goal: Cardiovascular complication will be avoided Outcome: Adequate for Discharge

## 2022-06-13 NOTE — Progress Notes (Signed)
NIF -30.  Pt on room air and in no distress.

## 2022-06-13 NOTE — Progress Notes (Signed)
[place] for heparin drip Indication:  transition off apixaban for procedure /hx PE/afib  Allergies  Allergen Reactions   Lisinopril Swelling    Patient Measurements: Height: '5\' 6"'$  (167.6 cm) Weight: 100.7 kg (222 lb 1.6 oz) IBW/kg (Calculated) : 63.8 Heparin Dosing Weight: 87 kg  Vital Signs: Temp: 98.4 F (36.9 C) (07/04 0332) Temp Source: Oral (07/04 0332) BP: 137/75 (07/04 0332) Pulse Rate: 68 (07/04 0332)  Labs: Recent Labs    06/10/22 1442 06/10/22 2209 06/11/22 0455 06/11/22 1400 06/12/22 0551 06/13/22 0438  HGB   < >  --  12.6*  --  13.0 13.8  HCT  --   --  41.9  --  43.7 45.6  PLT  --   --  162  --  172 175  APTT  --  98* 109* 74*  --   --   HEPARINUNFRC  --   --  0.78* 0.69 0.66 0.55  CREATININE  --   --  0.80  --  0.84 0.81   < > = values in this interval not displayed.     Estimated Creatinine Clearance: 87.6 mL/min (by C-G formula based on SCr of 0.81 mg/dL).   Medical History: Past Medical History:  Diagnosis Date   Arthritis    COPD (chronic obstructive pulmonary disease) ([place])    Diabetes mellitus without complication (HCC)    diet controlled   Dyspnea    with activity   Hyperlipidemia    Hypertension    Myasthenia gravis ([place])    Oxygen deficit    Ventricular tachycardia (HCC)     Medications:  Scheduled:   cholecalciferol  2,000 Units Oral Daily   digoxin  0.125 mg Oral Daily   empagliflozin  25 mg Oral Daily   furosemide  40 mg Intravenous Q12H   HYDROcodone-acetaminophen  1 tablet Oral Q6H   insulin aspart  0-15 Units Subcutaneous TID AC & HS   insulin aspart  4 Units Subcutaneous TID WC   ipratropium-albuterol  3 mL Nebulization TID   OLANZapine zydis  5 mg Oral BID   pantoprazole  40 mg Oral Daily   pramipexole  0.5 mg Oral QHS   pravastatin  20 mg Oral Daily   predniSONE  60 mg Oral Q breakfast   Followed by   Derrill Memo ON 06/20/2022] predniSONE  50 mg Oral Q breakfast   Followed by    Derrill Memo ON 06/27/2022] predniSONE  40 mg Oral Q breakfast   Followed by   Derrill Memo ON 07/04/2022] predniSONE  30 mg Oral Q breakfast   pyridostigmine  60 mg Oral TID   sacubitril-valsartan  1 tablet Oral BID   terbinafine  250 mg Oral Daily   tiotropium  18 mcg Inhalation Daily   vitamin B-12  1,000 mcg Oral Daily   Infusions:   heparin 1,550 Units/hr (06/13/22 0413)   Last dose of apixaban 06/09/22 @ 0826  Assessment: 76 yo male on apixaban PTA for hx PE./afib. TRansition to heparin drip to facilitate placement of PLEX catheter for MG flare Hgb 13.3  plt 158  aPTT 28  HL=0.92   Goal of Therapy:  Heparin level 0.3-0.7 units/ml aPTT 66-102 seconds Monitor platelets by anticoagulation protocol: Yes  7/01 0519 aPTT 47, subtherapeutic 7/01 1442 aPTT 55, HL=0.57,  subtherapeutic, not correlating,will increase from 1400 units/hr to 1650 units/hr 7/01 2209 aPTT 98, therapeutic x 1 7/02 0455 aPTT 109 slightly supratherapeutic; HL 0.78 7/02 1400 aPTT 74, HL=0.69, therapeutic  and correlating, cont at 1550 units/hr 7/03 0551 HL 0.66, therapeutic x 2 7/04 0438 HL 0.55, therapeutic X 3     Plan:  7/4:  HL @ 5102 = 0.55, therapeutic X 3 Will continue pt on current rate and recheck HL on 7/5 with AM labs.   Evian Derringer D 06/13/2022 5:47 AM

## 2022-06-14 ENCOUNTER — Telehealth: Payer: Self-pay

## 2022-06-14 ENCOUNTER — Encounter: Payer: Self-pay | Admitting: Family Medicine

## 2022-06-14 DIAGNOSIS — M9979 Connective tissue and disc stenosis of intervertebral foramina of abdomen and other regions: Secondary | ICD-10-CM

## 2022-06-14 NOTE — Telephone Encounter (Signed)
The Discharge summary does not specify when patient needs to follow up with PCP. Please review and advise how soon you need to see patient for a hospital follow up.

## 2022-06-14 NOTE — Telephone Encounter (Signed)
Transition Care Management Follow-up Telephone Call Date of discharge and from where: Discharged from [facility] on 06/13/2022. How have you been since you were released from the hospital? Per spouse, patient is doing well. Any questions or concerns? No  Items Reviewed: Did the pt receive and understand the discharge instructions provided? Yes  Medications obtained and verified? Yes  Reports adjusting doses and discontinuing metoprolol as advised.  Other? No  Any new allergies since your discharge? No  Dietary orders reviewed? No Do you have support at home? Yes   Home Care and Equipment/Supplies: Were home health services ordered? yes If so, what is the name of the agency? [facility] Has the agency set up a time to come to the patient's home? Yes. Spouse reports she will contact the team again this week. Were any new equipment or medical supplies ordered?  No What is the name of the medical supply agency? N/A Were you able to get the supplies/equipment? not applicable Do you have any questions related to the use of the equipment or supplies? N/A  Functional Questionnaire: (I = Independent and D = Dependent) ADLs: Independent  Bathing/Dressing- Independent  Meal Prep- Independent  Eating- Independent  Maintaining continence- Independent  Transferring/Ambulation- Spouse Assisting  Managing Meds- Spouse Assisting  Follow up appointments reviewed:  PCP Hospital f/u appt confirmed? Collaborate with clinic scheduling team. Availability for a hospital follow up currently not available. Team will message PCP and contact caregiver to schedule as appropriate. [facility] f/u appt confirmed? Will follow up with Cardiology as scheduled on 06/29/22/ Are transportation arrangements needed? No  If their condition worsens, is the pt aware to call PCP or go to the Emergency Dept.? Yes Was the patient provided with contact information for the PCP's office or  ED? Yes Was to pt encouraged to call back with questions or concerns? Yes/Discussed with spouse.

## 2022-06-14 NOTE — Telephone Encounter (Signed)
Copied from [place] [id number]. Topic: Appointment Scheduling - Scheduling Inquiry for Clinic >> Jun 14, 2022  4:55 PM ZJIRCVEL J wrote: Reason for CRM: pt was discharged from the hospital yesterday 7/4. Pt is needing a hospital follow up apt. Showing pt already have a hospital followup apt scheduled for a previous hosp visit but appt is needed sooner.   Please assist pt further with scheduling.

## 2022-06-14 NOTE — Telephone Encounter (Signed)
Please review. Hydrocodone is not listed on active medication list. Prescription may have been removed during recent hospitalization.

## 2022-06-15 ENCOUNTER — Telehealth: Payer: Self-pay

## 2022-06-15 LAB — BLOOD GAS, ARTERIAL
Acid-Base Excess: 12.3 mmol/L — ABNORMAL HIGH (ref 0.0–2.0)
Bicarbonate: 41.4 mmol/L — ABNORMAL HIGH (ref 20.0–28.0)
O2 Content: 4 L/min
O2 Saturation: 97.8 %
Patient temperature: 37
pCO2 arterial: 75 mmHg (ref 32–48)
pH, Arterial: 7.35 (ref 7.35–7.45)
pO2, Arterial: 88 mmHg (ref 83–108)

## 2022-06-15 MED ORDER — HYDROCODONE-ACETAMINOPHEN 5-325 MG PO TABS: 1.0000 | ORAL_TABLET | Freq: Four times a day (QID) | ORAL | 0 refills | Status: DC | PRN

## 2022-06-15 NOTE — Telephone Encounter (Signed)
Hospital follow up appt scheduled for 06/20/2022 at 10:40am.

## 2022-06-15 NOTE — Telephone Encounter (Signed)
Needs to be within 2 weeks of discharge. Is OK to use same day slot. Thanks!

## 2022-06-15 NOTE — Telephone Encounter (Signed)
Patient's wife [name] called requesting an update on the hydrocodone refill.

## 2022-06-16 ENCOUNTER — Telehealth: Payer: Self-pay

## 2022-06-16 DIAGNOSIS — J449 Chronic obstructive pulmonary disease, unspecified: Secondary | ICD-10-CM | POA: Diagnosis not present

## 2022-06-16 DIAGNOSIS — I509 Heart failure, unspecified: Secondary | ICD-10-CM | POA: Diagnosis not present

## 2022-06-16 DIAGNOSIS — Z9981 Dependence on supplemental oxygen: Secondary | ICD-10-CM | POA: Diagnosis not present

## 2022-06-16 NOTE — Telephone Encounter (Addendum)
   Heart Failure [facility] Discharge Phone Call Note  Call to patient.  He states that he feels he is doing very well.  They are getting ready to go grocery shopping.  He states that he had been weighing himself daily.  He gotten to 229.9 and to 228.9, but then had forgotten to weigh himself.  Went over the importance of daily weights and reporting that 3 pound weight gain overnight or changes in symptoms.  Wife is in the background on speaker phone dates that he does not have any swelling, did not complain of any PND or orthopnea or increasing shortness of breath.  He states he feels he has been doing well with the fluid restriction and also abstaining from using salt.  He states right now that the food tastes horrible but they plan on buying some seasonings while they are at the grocery store.  Went over medications list, he discontinued the toprol as instructed.  Wife states he had been on losartan but that was not on medication list, asked to speak with PCP when they see him next week about the missing medication.  He has follow-up with his PCP on July 11 and will be seen in the outpatient heart failure clinic on July 20 at 8:30 in the morning.  He states that transportation is not a problem. They had no further questions.  Pricilla Riffle RN [facility]

## 2022-06-18 DIAGNOSIS — Z7984 Long term (current) use of oral hypoglycemic drugs: Secondary | ICD-10-CM | POA: Diagnosis not present

## 2022-06-18 DIAGNOSIS — Z556 Problems related to health literacy: Secondary | ICD-10-CM | POA: Diagnosis not present

## 2022-06-18 DIAGNOSIS — Z7952 Long term (current) use of systemic steroids: Secondary | ICD-10-CM | POA: Diagnosis not present

## 2022-06-18 DIAGNOSIS — F419 Anxiety disorder, unspecified: Secondary | ICD-10-CM | POA: Diagnosis not present

## 2022-06-18 DIAGNOSIS — M1990 Unspecified osteoarthritis, unspecified site: Secondary | ICD-10-CM | POA: Diagnosis not present

## 2022-06-18 DIAGNOSIS — K219 Gastro-esophageal reflux disease without esophagitis: Secondary | ICD-10-CM | POA: Diagnosis not present

## 2022-06-18 DIAGNOSIS — I2510 Atherosclerotic heart disease of native coronary artery without angina pectoris: Secondary | ICD-10-CM | POA: Diagnosis not present

## 2022-06-18 DIAGNOSIS — Z7901 Long term (current) use of anticoagulants: Secondary | ICD-10-CM | POA: Diagnosis not present

## 2022-06-18 DIAGNOSIS — G894 Chronic pain syndrome: Secondary | ICD-10-CM | POA: Diagnosis not present

## 2022-06-18 DIAGNOSIS — G2581 Restless legs syndrome: Secondary | ICD-10-CM | POA: Diagnosis not present

## 2022-06-18 DIAGNOSIS — M47814 Spondylosis without myelopathy or radiculopathy, thoracic region: Secondary | ICD-10-CM | POA: Diagnosis not present

## 2022-06-18 DIAGNOSIS — I5043 Acute on chronic combined systolic (congestive) and diastolic (congestive) heart failure: Secondary | ICD-10-CM | POA: Diagnosis not present

## 2022-06-18 DIAGNOSIS — I110 Hypertensive heart disease with heart failure: Secondary | ICD-10-CM | POA: Diagnosis not present

## 2022-06-18 DIAGNOSIS — G7000 Myasthenia gravis without (acute) exacerbation: Secondary | ICD-10-CM | POA: Diagnosis not present

## 2022-06-18 DIAGNOSIS — J9601 Acute respiratory failure with hypoxia: Secondary | ICD-10-CM | POA: Diagnosis not present

## 2022-06-18 DIAGNOSIS — Z6830 Body mass index (BMI) 30.0-30.9, adult: Secondary | ICD-10-CM | POA: Diagnosis not present

## 2022-06-18 DIAGNOSIS — I480 Paroxysmal atrial fibrillation: Secondary | ICD-10-CM | POA: Diagnosis not present

## 2022-06-18 DIAGNOSIS — J441 Chronic obstructive pulmonary disease with (acute) exacerbation: Secondary | ICD-10-CM | POA: Diagnosis not present

## 2022-06-18 DIAGNOSIS — J189 Pneumonia, unspecified organism: Secondary | ICD-10-CM | POA: Diagnosis not present

## 2022-06-18 DIAGNOSIS — J440 Chronic obstructive pulmonary disease with acute lower respiratory infection: Secondary | ICD-10-CM | POA: Diagnosis not present

## 2022-06-18 DIAGNOSIS — E782 Mixed hyperlipidemia: Secondary | ICD-10-CM | POA: Diagnosis not present

## 2022-06-18 DIAGNOSIS — I255 Ischemic cardiomyopathy: Secondary | ICD-10-CM | POA: Diagnosis not present

## 2022-06-18 DIAGNOSIS — E119 Type 2 diabetes mellitus without complications: Secondary | ICD-10-CM | POA: Diagnosis not present

## 2022-06-19 ENCOUNTER — Telehealth: Payer: Self-pay

## 2022-06-19 ENCOUNTER — Telehealth: Payer: Self-pay | Admitting: Family Medicine

## 2022-06-19 NOTE — Telephone Encounter (Signed)
That's fine

## 2022-06-19 NOTE — Telephone Encounter (Signed)
Returned Joseph Macdonald's call and left detailed message on secure voice message system.

## 2022-06-19 NOTE — Progress Notes (Signed)
I,Jana Robinson,acting as a scribe for Lelon Huh, MD.,have documented all relevant documentation on the behalf of Lelon Huh, MD,as directed by  Lelon Huh, MD while in the presence of Lelon Huh, MD.  Established patient visit   Patient: Joseph Macdonald   DOB: 1946/02/01   76 y.o. Male  MRN: [id number] Visit Date: 06/20/2022  Today's healthcare provider: Lelon Huh, MD   No chief complaint on file.  Subjective    Follow up Hospitalization  Patient was admitted to [facility] on 06/08/22 and discharged on 06/14/22. 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 He reports this condition is improved.  Toprol was discontinue. He was discharged on increased dose of pyridostigmine of 60TID and day prednisone taper from '60mg'$  to '30mg'$ , then to continue 30 mg until seen by neurology as outpatient. He had previously followed briefly by Dr. Melrose Nakayama for myasthenia gravis in 2020, but last visit was in June of that year.   Has follow up with neurology at [facility] on 06/26/2022 ----------------------------------------------------------------------------------------- -   Medications: Outpatient Medications Prior to Visit  Medication Sig Note   albuterol (PROVENTIL HFA;VENTOLIN HFA) 108 (90 Base) MCG/ACT inhaler Inhale into the lungs every 6 (six) hours as needed for wheezing or shortness of breath.    apixaban (ELIQUIS) 5 MG TABS tablet Take 5 mg by mouth 2 (two) times daily.    budesonide-formoterol (SYMBICORT) 160-4.5 MCG/ACT inhaler Inhale 2 puffs into the lungs 2 (two) times daily.    cholecalciferol (VITAMIN D) 25 MCG (1000 UNIT) tablet Take 2,000 Units by mouth daily.    diazepam (VALIUM) 5 MG tablet Take 5 mg by mouth every 6 (six) hours as needed for anxiety.     digoxin (LANOXIN) 0.125 MG tablet Take 1 tablet (0.125 mg total) by mouth daily.    empagliflozin (JARDIANCE) 25 MG TABS tablet Take 25 mg by mouth daily.    flecainide (TAMBOCOR) 50 MG tablet Take 50 mg by mouth 2 (two) times daily.    HYDROcodone-acetaminophen (NORCO/VICODIN) 5-325 MG tablet Take 1 tablet by mouth every 6 (six) hours as needed for moderate pain.    OLANZapine zydis (ZYPREXA) 5 MG disintegrating tablet Take 1 tablet (5 mg total) by mouth 2 (two) times daily.    omeprazole (PRILOSEC) 40 MG capsule Take 40 mg by mouth 2 (two) times daily.     pramipexole (MIRAPEX) 0.5 MG tablet Take 1 tablet (0.5 mg total) by mouth at bedtime.    pravastatin (PRAVACHOL) 20 MG tablet Take 20 mg by mouth daily.    predniSONE (DELTASONE) 10 MG tablet Take 6 tablets (60 mg total) by mouth daily with breakfast for 6 days, THEN 5 tablets (50 mg total) daily with breakfast for 7 days, THEN 4 tablets (40 mg total) daily with breakfast for 7 days, THEN 3 tablets (30 mg total) daily with breakfast.    pyridostigmine (MESTINON) 60 MG tablet Take 1 tablet (60 mg total) by mouth 3 (three) times daily.    sacubitril-valsartan (ENTRESTO) 24-26 MG Take 1 tablet by mouth 2 (two) times daily.    terbinafine (LAMISIL) 250 MG tablet Take 250 mg by mouth daily.    tiotropium (SPIRIVA) 18 MCG inhalation capsule Place 18 mcg into inhaler and inhale daily.    torsemide (DEMADEX) 20 MG tablet Take 20 mg  by mouth daily.    vitamin B-12 (CYANOCOBALAMIN) 1000 MCG tablet Take 1,000 mcg by mouth daily.    [DISCONTINUED] furosemide (LASIX) 40 MG tablet Take 1 tablet (40 mg total) by mouth 2 (two) times daily. 06/20/2022: ran out so change to torsemide 20   No facility-administered medications prior to visit.    Review of Systems     Objective    BP 112/70 (BP Location: Right Arm, Patient Position: Sitting, Cuff Size: Normal)   Pulse 88   Temp 99.2 F (37.3 C) (Oral)   Resp 14   Wt 225 lb 9.6 oz (102.3 kg)   SpO2 96%  Comment: with deep breaths  BMI 36.41 kg/m    Physical Exam  General: Appearance:    Obese male in no acute distress  Eyes:    PERRL, conjunctiva/corneas clear, EOM's intact       Lungs:     Clear to auscultation bilaterally, respirations unlabored  Heart:    Normal heart rate. Regular rhythm. No murmurs, rubs, or gallops.    MS:   All extremities are intact.    Neurologic:   Awake, alert, oriented x 3. No apparent focal neurological defect.          Assessment & Plan     1. Anxiety Was apparently started on olanazapine due to agitation while critically ill in May and discharged on medication. His wife does feel it was working well before recently running out. He would like to continue on it until he can get back in with his PCP at the [place].  - OLANZapine zydis (ZYPREXA) 5 MG disintegrating tablet; Take 1 tablet (5 mg total) by mouth 2 (two) times daily.  Dispense: 60 tablet; Refill: 3  2. Chronic obstructive pulmonary disease, unspecified COPD type ([place]) Back to baseline after exacerbation with recent hospitalization. Is to continue current inhalers and [facility].   3. Shortness of breath Multifactorial, but greatly improved.   4. HFrEF (heart failure with reduced ejection fraction) (HCC) Symptomatically back to baseline.  refill- torsemide (DEMADEX) 20 MG tablet; Take 20 mg by mouth daily.  Follow up CHF clinic this month as scheduled.   5. Myasthenia Gravis Crisis during recent hospitalization for above, now back to baseline. Is to follow up with neurology at [facility] as already scheduled.   6. Osteoarthritis both knees Patient inquired about what would be safe to take with his current medication conditions. Recommend OTC Tylenol per label and prn diclofenac gel.         The entirety of the information documented in the History of Present Illness, Review of Systems and Physical Exam were personally obtained by me. Portions of this information were initially documented by  the CMA and reviewed by me for thoroughness and accuracy.     Lelon Huh, MD  [facility] [phone] (phone) [phone] (fax)  [place]

## 2022-06-19 NOTE — Telephone Encounter (Signed)
Tonya called from [facility] home health in regards to verbal orders for the patient. She states she would like verbal orders for the following: Nursing 1 week, 3                 Once every 2 weeks for 6 weeks.                  2 PRN's Tonya's best contact number is [phone]. Please advise

## 2022-06-19 NOTE — Telephone Encounter (Signed)
Waldon Reining PT with [facility] is calling in to request verbal orders.     Frequency: 2 week 3 and1 week 6  CB: [phone]- secure vm.

## 2022-06-20 ENCOUNTER — Ambulatory Visit (INDEPENDENT_AMBULATORY_CARE_PROVIDER_SITE_OTHER): Payer: PPO | Admitting: Family Medicine

## 2022-06-20 ENCOUNTER — Encounter: Payer: Self-pay | Admitting: Family Medicine

## 2022-06-20 VITALS — BP 112/70 | HR 88 | Temp 99.2°F | Resp 14 | Wt 225.6 lb

## 2022-06-20 DIAGNOSIS — R0602 Shortness of breath: Secondary | ICD-10-CM

## 2022-06-20 DIAGNOSIS — J449 Chronic obstructive pulmonary disease, unspecified: Secondary | ICD-10-CM

## 2022-06-20 DIAGNOSIS — I5020 Unspecified systolic (congestive) heart failure: Secondary | ICD-10-CM

## 2022-06-20 DIAGNOSIS — F419 Anxiety disorder, unspecified: Secondary | ICD-10-CM | POA: Diagnosis not present

## 2022-06-20 DIAGNOSIS — G7000 Myasthenia gravis without (acute) exacerbation: Secondary | ICD-10-CM

## 2022-06-20 DIAGNOSIS — M170 Bilateral primary osteoarthritis of knee: Secondary | ICD-10-CM

## 2022-06-20 MED ORDER — OLANZAPINE 5 MG PO TBDP: 5.0000 mg | ORAL_TABLET | Freq: Two times a day (BID) | ORAL | 3 refills | Status: DC

## 2022-06-20 NOTE — Telephone Encounter (Signed)
Follow up outreach with patient's spouse regarding pending post hospitalization follow-up. Patient scheduled for evaluation with his PCP on 06/20/22. Advised to call or contact the clinic if patient requires assistance prior to his appointment.   [facility] [phone]

## 2022-06-26 ENCOUNTER — Telehealth: Payer: Self-pay

## 2022-06-26 NOTE — Telephone Encounter (Signed)
Patient's spouse requested to confirm prescription list was updated. Reports recent addition of Flecainide 50 mg. Chart reviewed. Flecainide 50 mg/twice a day listed in current medication profile.   Spouse agreed to call with updates or to confirm changes with medication doses as needed.

## 2022-06-27 DIAGNOSIS — Z7901 Long term (current) use of anticoagulants: Secondary | ICD-10-CM | POA: Diagnosis not present

## 2022-06-27 DIAGNOSIS — J441 Chronic obstructive pulmonary disease with (acute) exacerbation: Secondary | ICD-10-CM | POA: Diagnosis not present

## 2022-06-27 DIAGNOSIS — Z6830 Body mass index (BMI) 30.0-30.9, adult: Secondary | ICD-10-CM | POA: Diagnosis not present

## 2022-06-27 DIAGNOSIS — J440 Chronic obstructive pulmonary disease with acute lower respiratory infection: Secondary | ICD-10-CM | POA: Diagnosis not present

## 2022-06-27 DIAGNOSIS — E119 Type 2 diabetes mellitus without complications: Secondary | ICD-10-CM | POA: Diagnosis not present

## 2022-06-27 DIAGNOSIS — K219 Gastro-esophageal reflux disease without esophagitis: Secondary | ICD-10-CM | POA: Diagnosis not present

## 2022-06-27 DIAGNOSIS — G7000 Myasthenia gravis without (acute) exacerbation: Secondary | ICD-10-CM | POA: Diagnosis not present

## 2022-06-27 DIAGNOSIS — J189 Pneumonia, unspecified organism: Secondary | ICD-10-CM | POA: Diagnosis not present

## 2022-06-27 DIAGNOSIS — Z7952 Long term (current) use of systemic steroids: Secondary | ICD-10-CM | POA: Diagnosis not present

## 2022-06-27 DIAGNOSIS — F419 Anxiety disorder, unspecified: Secondary | ICD-10-CM | POA: Diagnosis not present

## 2022-06-27 DIAGNOSIS — I110 Hypertensive heart disease with heart failure: Secondary | ICD-10-CM | POA: Diagnosis not present

## 2022-06-27 DIAGNOSIS — I480 Paroxysmal atrial fibrillation: Secondary | ICD-10-CM | POA: Diagnosis not present

## 2022-06-27 DIAGNOSIS — G2581 Restless legs syndrome: Secondary | ICD-10-CM | POA: Diagnosis not present

## 2022-06-27 DIAGNOSIS — Z556 Problems related to health literacy: Secondary | ICD-10-CM | POA: Diagnosis not present

## 2022-06-27 DIAGNOSIS — G894 Chronic pain syndrome: Secondary | ICD-10-CM | POA: Diagnosis not present

## 2022-06-27 DIAGNOSIS — Z7984 Long term (current) use of oral hypoglycemic drugs: Secondary | ICD-10-CM | POA: Diagnosis not present

## 2022-06-27 DIAGNOSIS — I5043 Acute on chronic combined systolic (congestive) and diastolic (congestive) heart failure: Secondary | ICD-10-CM | POA: Diagnosis not present

## 2022-06-27 DIAGNOSIS — I2510 Atherosclerotic heart disease of native coronary artery without angina pectoris: Secondary | ICD-10-CM | POA: Diagnosis not present

## 2022-06-27 DIAGNOSIS — M1990 Unspecified osteoarthritis, unspecified site: Secondary | ICD-10-CM | POA: Diagnosis not present

## 2022-06-27 DIAGNOSIS — M47814 Spondylosis without myelopathy or radiculopathy, thoracic region: Secondary | ICD-10-CM | POA: Diagnosis not present

## 2022-06-27 DIAGNOSIS — E782 Mixed hyperlipidemia: Secondary | ICD-10-CM | POA: Diagnosis not present

## 2022-06-27 DIAGNOSIS — J9601 Acute respiratory failure with hypoxia: Secondary | ICD-10-CM | POA: Diagnosis not present

## 2022-06-27 DIAGNOSIS — I255 Ischemic cardiomyopathy: Secondary | ICD-10-CM | POA: Diagnosis not present

## 2022-06-27 NOTE — Progress Notes (Signed)
Patient ID: Joseph Macdonald, male    DOB: 05-16-1946, 76 y.o.   MRN: [id number]  HPI  Joseph Macdonald is a 76 y/o male with a history of DM, hyperlipidemia, HTN, anxiety, COPD, myasthenia gravis, VT, previous tobacco use and chronic heart failure.   Echo report from 04/30/22 reviewed and showed an EF of 35-40% along with mild LVH.  RHC/LHC done 05/16/22 and showed: Conclusions: Moderate single-vessel coronary artery disease with 60% mid LAD stenosis that is borderline significant by iFR (iFR = 0.90). No angiographically significant coronary artery disease observed in the LCx or RCA. Upper normal to mildly elevated left heart filling pressures (LVEDP and PCWP 18 mmHg). Moderate pulmonary hypertension (mean PAP 34 mmHg, PVR 2.9 WU).  Moderately-severely elevated right heart filling pressure (mean RAP 15 mmHg, RVEDP 14 mmHg). Normal Fick cardiac output/index (CO 5.5 L/min, CI 2.6 L/min/m).  Recommendations: Favor medical management of moderate LAD disease that is borderline significant by IFR but supplies a relatively small territory as the LV apex is perfused by branches from the distal RCA. Secondary prevention of coronary artery disease. Continue gentle diuresis.  Respiratory failure seems to be out of proportion to left heart failure.  Elevated pulmonary artery and right heart pressures are more consistent with primary pulmonary process driving the patient's respiratory failure. Restart IV heparin 2 hours after TR band has been removed.  Transition to [place] as soon as tomorrow if there is no evidence of bleeding/vascular injury at catheterization sites.  Admitted 06/08/22 due to cough and shortness of breath due to HF/COPD exacerbation. Following admission, patient reported issues with leg weakness and speech difficulty and possibly swallowing, concerning for myasthenia flare. Neurology consult obtained. Received increased dose of pyridostigmine and weakness symptoms improved. Metoprolol stopped.  Patient refused bipap. Diuresed >18L. IV steroids and antibiotics given. Discharged after 5 days.   Joseph Macdonald presents today for his initial visit with a chief complaint of moderate fatigue with minimal exertion. Describes this as chronic in nature. Joseph Macdonald has associated shortness of breath, pedal edema, abdominal distention, light-headedness, easy bruising, chronic pain and chronic difficulty sleeping along with this. Joseph Macdonald denies any palpitations, chest pain, cough or weight gain.   Normally takes '15mg'$  prednisone daily for his myasthenia gravis but is currently doing an extended prednisone taper due to a flare.   Says that Joseph Macdonald's worn compression socks in the past but they cut into his legs. Did go home from the hospital with compression boots but hasn't started wearing them yet.   Joseph Macdonald reports a history of hyperkalemia as well as hypokalemia. Currently is off of his potassium supplements.   Drinking 3-4 cups of water daily but is unsure of how many ounces each cup holds. Also drinks milk and tea during the day.   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   Past Medical History:  Diagnosis Date   Anxiety    Arthritis    CHF (congestive heart failure) (HCC)    COPD (chronic obstructive pulmonary disease) (HCC)    Diabetes mellitus without complication (HCC)    diet controlled   Dyspnea    with activity   Hyperlipidemia    Hypertension    Myasthenia gravis ([place])    Oxygen deficit    Ventricular tachycardia ([place])    Past Surgical History:  Procedure Laterality Date   carpel tunnel sx  CATARACT EXTRACTION     right eye   CATARACT EXTRACTION W/PHACO Left 12/01/2020   Procedure: CATARACT EXTRACTION PHACO AND INTRAOCULAR LENS PLACEMENT (IOC) LEFT 10.83 01:14.1 14.6%;  Surgeon: Leandrew Koyanagi, MD;  Location: [facility];  Service: Ophthalmology;  Laterality: Left;   INTRAVASCULAR PRESSURE  WIRE/FFR STUDY N/A 05/16/2022   Procedure: INTRAVASCULAR PRESSURE WIRE/FFR STUDY;  Surgeon: Nelva Bush, MD;  Location: [place] [facility];  Service: Cardiovascular;  Laterality: N/A;   RETINAL DETACHMENT SURGERY     RIGHT/LEFT HEART CATH AND CORONARY ANGIOGRAPHY N/A 05/16/2022   Procedure: RIGHT/LEFT HEART CATH AND CORONARY ANGIOGRAPHY;  Surgeon: Nelva Bush, MD;  Location: [facility];  Service: Cardiovascular;  Laterality: N/A;   SKIN GRAFT     Behind left knee   TONSILLECTOMY AND ADENOIDECTOMY     Family History  Problem Relation Age of Onset   Hypertension Mother    Thyroid disease Mother    Kidney disease Mother    Arthritis Mother    Stroke Father    Heart disease Father 49   Arthritis Father    Heart attack Father    Arthritis Other    Social History   Tobacco Use   Smoking status: Former    Packs/day: 0.50    Years: 40.00    Total pack years: 20.00    Types: Pipe, Cigarettes    Quit date: 08/19/2017    Years since quitting: 4.8   Smokeless tobacco: Former    Quit date: 08/19/2017   Tobacco comments:    previously smoked 1 1/2 pack since his 76 years old  Quit 2018  Substance Use Topics   Alcohol use: Yes    Comment: occasionally- monthly or less   Allergies  Allergen Reactions   Lisinopril Swelling   Prior to Admission medications   Medication Sig Start Date End Date Taking? Authorizing Provider  albuterol (PROVENTIL HFA;VENTOLIN HFA) 108 (90 Base) MCG/ACT inhaler Inhale into the lungs every 6 (six) hours as needed for wheezing or shortness of breath.   Yes [provider]  apixaban (ELIQUIS) 5 MG TABS tablet Take 5 mg by mouth 2 (two) times daily.   Yes [provider]  budesonide-formoterol (SYMBICORT) 160-4.5 MCG/ACT inhaler Inhale 2 puffs into the lungs 2 (two) times daily. 06/16/19  Yes [name], MD  cholecalciferol (VITAMIN D) 25 MCG (1000 UNIT) tablet Take 2,000 Units by mouth daily.   Yes [provider]   diazepam (VALIUM) 5 MG tablet Take 5 mg by mouth every 6 (six) hours as needed for anxiety.   Yes [provider]  digoxin (LANOXIN) 0.125 MG tablet Take 1 tablet (0.125 mg total) by mouth daily. 05/18/22  Yes Max Sane, MD  empagliflozin (JARDIANCE) 25 MG TABS tablet Take 25 mg by mouth daily.   Yes [provider]  HYDROcodone-acetaminophen (NORCO/VICODIN) 5-325 MG tablet Take 1 tablet by mouth every 6 (six) hours as needed for moderate pain. 06/15/22  Yes [name], MD  metoprolol succinate (TOPROL-XL) 25 MG 24 hr tablet Take 25 mg by mouth daily.   Yes [provider]  OLANZapine zydis (ZYPREXA) 5 MG disintegrating tablet Take 1 tablet (5 mg total) by mouth 2 (two) times daily. 06/20/22  Yes [name], MD  omeprazole (PRILOSEC) 40 MG capsule Take 40 mg by mouth 2 (two) times daily.    Yes [provider]  pramipexole (MIRAPEX) 0.5 MG tablet Take 1 tablet (0.5 mg total) by mouth at bedtime.  06/04/21  Yes [name], MD  pravastatin (PRAVACHOL) 20 MG tablet Take 20 mg by mouth daily.   Yes [provider]  predniSONE (DELTASONE) 10 MG tablet Take 6 tablets (60 mg total) by mouth daily with breakfast for 6 days, THEN 5 tablets (50 mg total) daily with breakfast for 7 days, THEN 4 tablets (40 mg total) daily with breakfast for 7 days, THEN 3 tablets (30 mg total) daily with breakfast. 06/14/22 08/03/22 Yes Annita Brod, MD  pyridostigmine (MESTINON) 60 MG tablet Take 1 tablet (60 mg total) by mouth 3 (three) times daily. 06/13/22  Yes Annita Brod, MD  sacubitril-valsartan (ENTRESTO) 24-26 MG Take 1 tablet by mouth 2 (two) times daily. 05/22/22  Yes [name], Kathlene November, MD  terbinafine (LAMISIL) 250 MG tablet Take 250 mg by mouth daily. 06/03/21  Yes [provider]  tiotropium (SPIRIVA) 18 MCG inhalation capsule Place 18 mcg into inhaler and inhale daily.   Yes [provider]  torsemide (DEMADEX) 20 MG tablet Take 20  mg by mouth daily.   Yes [provider]  vitamin B-12 (CYANOCOBALAMIN) 1000 MCG tablet Take 1,000 mcg by mouth daily.   Yes [provider]  flecainide (TAMBOCOR) 50 MG tablet Take 50 mg by mouth 2 (two) times daily. Patient not taking: Reported on 06/29/2022    [provider]   Review of Systems  Constitutional:  Positive for fatigue (easily). Negative for appetite change.  HENT:  Negative for congestion, postnasal drip and sore throat.   Eyes: Negative.   Respiratory:  Positive for shortness of breath (easily). Negative for cough and chest tightness.   Cardiovascular:  Positive for leg swelling. Negative for chest pain and palpitations.  Gastrointestinal:  Positive for abdominal distention. Negative for abdominal pain.  Endocrine: Negative.   Genitourinary: Negative.   Musculoskeletal:  Positive for arthralgias and back pain.  Skin: Negative.   Allergic/Immunologic: Negative.   Neurological:  Positive for light-headedness (with sudden position changes). Negative for dizziness.  Hematological:  Negative for adenopathy. Bruises/bleeds easily.  Psychiatric/Behavioral:  Positive for sleep disturbance (chronic trouble sleeping at night; sleeping on 1 pillow). Negative for dysphoric mood. The patient is not nervous/anxious.     Vitals:   06/29/22 0829  BP: (!) 117/58  Pulse: 75  Resp: 18  SpO2: 90%  Weight: 234 lb 8 oz (106.4 kg)  Height: '5\' 7"'$  (1.702 m)   Wt Readings from Last 3 Encounters:  06/29/22 234 lb 8 oz (106.4 kg)  06/20/22 225 lb 9.6 oz (102.3 kg)  06/13/22 222 lb 1.6 oz (100.7 kg)   Lab Results  Component Value Date   CREATININE 0.81 06/13/2022   CREATININE 0.84 06/12/2022   CREATININE 0.80 06/11/2022   Physical Exam Vitals and nursing note reviewed. Exam conducted with a chaperone present (wife).  Constitutional:      Appearance: Normal appearance.  HENT:     Head: Normocephalic and atraumatic.  Cardiovascular:     Rate and Rhythm:  Normal rate and regular rhythm.  Pulmonary:     Effort: Pulmonary effort is normal. No respiratory distress.     Breath sounds: No wheezing or rales.  Abdominal:     General: There is no distension.     Palpations: Abdomen is soft.  Musculoskeletal:        General: No tenderness.     Cervical back: Normal range of motion and neck supple.     Right lower leg: Edema (2+ pitting) present.  Left lower leg: Edema (2+ pitting) present.  Skin:    General: Skin is warm and dry.  Neurological:     General: No focal deficit present.     Mental Status: Joseph Macdonald is alert and oriented to person, place, and time.  Psychiatric:        Mood and Affect: Mood normal.        Behavior: Behavior normal.        Thought Content: Thought content normal.   Assessment & Plan:  1: Chronic heart failure with reduced ejection fraction- - NYHA class III - euvolemic today - weighing daily and understands to call for an overnight weight gain of > 2 pounds or a weekly weight gain of > 5 pounds - adding less salt and is now trying to use Mrs Deliah Boston for seasoning; reviewed the importance of not using table salt and to keep daily sodium intake to '2000mg'$ ; used to like drinking V8 juice until Joseph Macdonald read how much sodium is in 1 serving - unsure of his fluid intake; Joseph Macdonald drinks 3-4 cups of water and his wife estimates the cup holds ~ 32 ounces; Joseph Macdonald also will drink milk and tea; instructed to measure how much that cup holds and that his daily fluid intake should be between 60-64 ounces daily - saw cardiology Rockey Situ) 05/22/22 - on GDMT of empagliflozin, metoprolol and entresto - consider adding spironolactone in the future being mindful of potassium level; wife says that in the past his potassium level has gotten higt; currently Joseph Macdonald's not on any potassium supplements - has had swelling with lisinopril  - BNP 06/08/22 was 714.7  2: HTN- - BP looks good (117/58) - saw PCP Caryn Section) 06/20/22 - BMP 06/13/22 reviewed and showed sodium 141,  potassium 3.1, creatinine 0.81 and GFR >60  3: DM- - A1c 04/30/22 was 6.9% - diet controlled  4: COPD- - using spiriva and PRN albuterol - no wheezing noted  5: Myasthenia Gravis- - sees neurology at the [place] - takes prednisone '15mg'$  daily but currently doing longer prednisone taper due to recent flare  6: Lymphedema- - stage 2 - elevates his legs with slight improvement of edema - has worn compression socks in the past but they cut into his legs up by his knees - limited in his ability to exercise due to his shortness of breath - wife says that Joseph Macdonald came home from the hospital with compression boots but Joseph Macdonald hasn't been using them yet; instructed to start using them 1-2 times/ day for 60 minutes at a time   Medication list reviewed.   Return in 6 weeks, sooner if needed.

## 2022-06-28 DIAGNOSIS — J9601 Acute respiratory failure with hypoxia: Secondary | ICD-10-CM | POA: Diagnosis not present

## 2022-06-28 DIAGNOSIS — G2581 Restless legs syndrome: Secondary | ICD-10-CM | POA: Diagnosis not present

## 2022-06-28 DIAGNOSIS — I480 Paroxysmal atrial fibrillation: Secondary | ICD-10-CM | POA: Diagnosis not present

## 2022-06-28 DIAGNOSIS — I5043 Acute on chronic combined systolic (congestive) and diastolic (congestive) heart failure: Secondary | ICD-10-CM | POA: Diagnosis not present

## 2022-06-28 DIAGNOSIS — J440 Chronic obstructive pulmonary disease with acute lower respiratory infection: Secondary | ICD-10-CM | POA: Diagnosis not present

## 2022-06-28 DIAGNOSIS — I255 Ischemic cardiomyopathy: Secondary | ICD-10-CM | POA: Diagnosis not present

## 2022-06-28 DIAGNOSIS — I2510 Atherosclerotic heart disease of native coronary artery without angina pectoris: Secondary | ICD-10-CM | POA: Diagnosis not present

## 2022-06-28 DIAGNOSIS — F419 Anxiety disorder, unspecified: Secondary | ICD-10-CM | POA: Diagnosis not present

## 2022-06-28 DIAGNOSIS — M47814 Spondylosis without myelopathy or radiculopathy, thoracic region: Secondary | ICD-10-CM | POA: Diagnosis not present

## 2022-06-28 DIAGNOSIS — E782 Mixed hyperlipidemia: Secondary | ICD-10-CM | POA: Diagnosis not present

## 2022-06-28 DIAGNOSIS — K219 Gastro-esophageal reflux disease without esophagitis: Secondary | ICD-10-CM | POA: Diagnosis not present

## 2022-06-28 DIAGNOSIS — G7000 Myasthenia gravis without (acute) exacerbation: Secondary | ICD-10-CM | POA: Diagnosis not present

## 2022-06-28 DIAGNOSIS — J441 Chronic obstructive pulmonary disease with (acute) exacerbation: Secondary | ICD-10-CM | POA: Diagnosis not present

## 2022-06-28 DIAGNOSIS — Z556 Problems related to health literacy: Secondary | ICD-10-CM | POA: Diagnosis not present

## 2022-06-28 DIAGNOSIS — G894 Chronic pain syndrome: Secondary | ICD-10-CM | POA: Diagnosis not present

## 2022-06-28 DIAGNOSIS — J189 Pneumonia, unspecified organism: Secondary | ICD-10-CM | POA: Diagnosis not present

## 2022-06-28 DIAGNOSIS — Z7901 Long term (current) use of anticoagulants: Secondary | ICD-10-CM | POA: Diagnosis not present

## 2022-06-28 DIAGNOSIS — Z7984 Long term (current) use of oral hypoglycemic drugs: Secondary | ICD-10-CM | POA: Diagnosis not present

## 2022-06-28 DIAGNOSIS — E119 Type 2 diabetes mellitus without complications: Secondary | ICD-10-CM | POA: Diagnosis not present

## 2022-06-28 DIAGNOSIS — M1990 Unspecified osteoarthritis, unspecified site: Secondary | ICD-10-CM | POA: Diagnosis not present

## 2022-06-28 DIAGNOSIS — Z7952 Long term (current) use of systemic steroids: Secondary | ICD-10-CM | POA: Diagnosis not present

## 2022-06-28 DIAGNOSIS — I110 Hypertensive heart disease with heart failure: Secondary | ICD-10-CM | POA: Diagnosis not present

## 2022-06-28 DIAGNOSIS — Z6830 Body mass index (BMI) 30.0-30.9, adult: Secondary | ICD-10-CM | POA: Diagnosis not present

## 2022-06-29 ENCOUNTER — Ambulatory Visit (HOSPITAL_BASED_OUTPATIENT_CLINIC_OR_DEPARTMENT_OTHER): Payer: PPO | Admitting: Family

## 2022-06-29 ENCOUNTER — Telehealth: Payer: Self-pay

## 2022-06-29 ENCOUNTER — Other Ambulatory Visit
Admission: RE | Admit: 2022-06-29 | Discharge: 2022-06-29 | Disposition: A | Discharge status: Home / Self Care | Payer: PPO | Source: Ambulatory Visit | Attending: Family | Admitting: Family

## 2022-06-29 ENCOUNTER — Encounter: Payer: Self-pay | Admitting: Family

## 2022-06-29 VITALS — BP 117/58 | HR 75 | Resp 18 | Ht 67.0 in | Wt 234.5 lb

## 2022-06-29 DIAGNOSIS — J449 Chronic obstructive pulmonary disease, unspecified: Secondary | ICD-10-CM

## 2022-06-29 DIAGNOSIS — E119 Type 2 diabetes mellitus without complications: Secondary | ICD-10-CM | POA: Diagnosis not present

## 2022-06-29 DIAGNOSIS — I5032 Chronic diastolic (congestive) heart failure: Secondary | ICD-10-CM

## 2022-06-29 DIAGNOSIS — I890 Lymphedema, not elsewhere classified: Secondary | ICD-10-CM | POA: Diagnosis not present

## 2022-06-29 DIAGNOSIS — G7000 Myasthenia gravis without (acute) exacerbation: Secondary | ICD-10-CM | POA: Diagnosis not present

## 2022-06-29 DIAGNOSIS — I10 Essential (primary) hypertension: Secondary | ICD-10-CM

## 2022-06-29 LAB — BASIC METABOLIC PANEL
Anion gap: 11 (ref 5–15)
BUN: 24 mg/dL — ABNORMAL HIGH (ref 8–23)
CO2: 33 mmol/L — ABNORMAL HIGH (ref 22–32)
Calcium: 8.4 mg/dL — ABNORMAL LOW (ref 8.9–10.3)
Chloride: 98 mmol/L (ref 98–111)
Creatinine, Ser: 0.88 mg/dL (ref 0.61–1.24)
GFR, Estimated: >60 mL/min (ref >60)
Glucose, Bld: 174 mg/dL — ABNORMAL HIGH (ref 70–99)
Potassium: 4.2 mmol/L (ref 3.5–5.1)
Sodium: 142 mmol/L (ref 135–145)

## 2022-06-29 NOTE — Telephone Encounter (Addendum)
Patient called with lab results and below message. Pt verbalized understanding and had no questions or concerns at this time.  Georg Ruddle, RN ----- Message from Alisa Graff, [place] sent at 06/29/2022 11:23 AM EDT ----- Potassium level is normal and kidney function looks great

## 2022-06-29 NOTE — Patient Instructions (Addendum)
Continue weighing daily and call for an overnight weight gain of 3 pounds or more or a weekly weight gain of more than 5 pounds.   If you have voicemail, please make sure your mailbox is cleaned out so that we may leave a message and please make sure to listen to any voicemails.    Wear compression boots 1-2 times a day for about an hour each time.   Keep daily fluid intake to 60-64 ounces

## 2022-06-30 ENCOUNTER — Telehealth: Payer: Self-pay

## 2022-06-30 NOTE — Telephone Encounter (Signed)
Spoke with Dr. Odella Aquas who voiced concern for patient as documented in the secure chat I sent to Dr. Rockey Situ copied and pasted below.  Hey Dr. Rockey Situ, Dr. Desiree Hane from the [facility] in [place] called about this patient. He saw him today and is very concerned as the pt told him a Dr. Guadlupe Spanish??? stopped his Flecainide and put him on Metoprolol Succinate 25 MG. We can't find any record of this, and he is concerned with this change. He is requesting a call back from you- his cell phone is [phone]  Dr. Rockey Situ responded and informed me that he called and spoke with Dr. Odella Aquas.

## 2022-07-03 ENCOUNTER — Inpatient Hospital Stay: Payer: PPO | Admitting: Family Medicine

## 2022-07-03 DIAGNOSIS — Z7984 Long term (current) use of oral hypoglycemic drugs: Secondary | ICD-10-CM | POA: Diagnosis not present

## 2022-07-03 DIAGNOSIS — E782 Mixed hyperlipidemia: Secondary | ICD-10-CM | POA: Diagnosis not present

## 2022-07-03 DIAGNOSIS — Z7901 Long term (current) use of anticoagulants: Secondary | ICD-10-CM | POA: Diagnosis not present

## 2022-07-03 DIAGNOSIS — J9601 Acute respiratory failure with hypoxia: Secondary | ICD-10-CM | POA: Diagnosis not present

## 2022-07-03 DIAGNOSIS — Z7952 Long term (current) use of systemic steroids: Secondary | ICD-10-CM | POA: Diagnosis not present

## 2022-07-03 DIAGNOSIS — G2581 Restless legs syndrome: Secondary | ICD-10-CM | POA: Diagnosis not present

## 2022-07-03 DIAGNOSIS — J441 Chronic obstructive pulmonary disease with (acute) exacerbation: Secondary | ICD-10-CM | POA: Diagnosis not present

## 2022-07-03 DIAGNOSIS — F419 Anxiety disorder, unspecified: Secondary | ICD-10-CM | POA: Diagnosis not present

## 2022-07-03 DIAGNOSIS — I110 Hypertensive heart disease with heart failure: Secondary | ICD-10-CM | POA: Diagnosis not present

## 2022-07-03 DIAGNOSIS — M47814 Spondylosis without myelopathy or radiculopathy, thoracic region: Secondary | ICD-10-CM | POA: Diagnosis not present

## 2022-07-03 DIAGNOSIS — J440 Chronic obstructive pulmonary disease with acute lower respiratory infection: Secondary | ICD-10-CM | POA: Diagnosis not present

## 2022-07-03 DIAGNOSIS — K219 Gastro-esophageal reflux disease without esophagitis: Secondary | ICD-10-CM | POA: Diagnosis not present

## 2022-07-03 DIAGNOSIS — J189 Pneumonia, unspecified organism: Secondary | ICD-10-CM | POA: Diagnosis not present

## 2022-07-03 DIAGNOSIS — I255 Ischemic cardiomyopathy: Secondary | ICD-10-CM | POA: Diagnosis not present

## 2022-07-03 DIAGNOSIS — I2510 Atherosclerotic heart disease of native coronary artery without angina pectoris: Secondary | ICD-10-CM | POA: Diagnosis not present

## 2022-07-03 DIAGNOSIS — M1990 Unspecified osteoarthritis, unspecified site: Secondary | ICD-10-CM | POA: Diagnosis not present

## 2022-07-03 DIAGNOSIS — Z556 Problems related to health literacy: Secondary | ICD-10-CM | POA: Diagnosis not present

## 2022-07-03 DIAGNOSIS — Z6830 Body mass index (BMI) 30.0-30.9, adult: Secondary | ICD-10-CM | POA: Diagnosis not present

## 2022-07-03 DIAGNOSIS — G7000 Myasthenia gravis without (acute) exacerbation: Secondary | ICD-10-CM | POA: Diagnosis not present

## 2022-07-03 DIAGNOSIS — E119 Type 2 diabetes mellitus without complications: Secondary | ICD-10-CM | POA: Diagnosis not present

## 2022-07-03 DIAGNOSIS — I5043 Acute on chronic combined systolic (congestive) and diastolic (congestive) heart failure: Secondary | ICD-10-CM | POA: Diagnosis not present

## 2022-07-03 DIAGNOSIS — G894 Chronic pain syndrome: Secondary | ICD-10-CM | POA: Diagnosis not present

## 2022-07-03 DIAGNOSIS — I480 Paroxysmal atrial fibrillation: Secondary | ICD-10-CM | POA: Diagnosis not present

## 2022-07-05 DIAGNOSIS — G7000 Myasthenia gravis without (acute) exacerbation: Secondary | ICD-10-CM | POA: Diagnosis not present

## 2022-07-05 DIAGNOSIS — Z556 Problems related to health literacy: Secondary | ICD-10-CM | POA: Diagnosis not present

## 2022-07-05 DIAGNOSIS — J9601 Acute respiratory failure with hypoxia: Secondary | ICD-10-CM | POA: Diagnosis not present

## 2022-07-05 DIAGNOSIS — I5043 Acute on chronic combined systolic (congestive) and diastolic (congestive) heart failure: Secondary | ICD-10-CM | POA: Diagnosis not present

## 2022-07-05 DIAGNOSIS — I255 Ischemic cardiomyopathy: Secondary | ICD-10-CM | POA: Diagnosis not present

## 2022-07-05 DIAGNOSIS — Z7984 Long term (current) use of oral hypoglycemic drugs: Secondary | ICD-10-CM | POA: Diagnosis not present

## 2022-07-05 DIAGNOSIS — I2510 Atherosclerotic heart disease of native coronary artery without angina pectoris: Secondary | ICD-10-CM | POA: Diagnosis not present

## 2022-07-05 DIAGNOSIS — M1990 Unspecified osteoarthritis, unspecified site: Secondary | ICD-10-CM | POA: Diagnosis not present

## 2022-07-05 DIAGNOSIS — J189 Pneumonia, unspecified organism: Secondary | ICD-10-CM | POA: Diagnosis not present

## 2022-07-05 DIAGNOSIS — E119 Type 2 diabetes mellitus without complications: Secondary | ICD-10-CM | POA: Diagnosis not present

## 2022-07-05 DIAGNOSIS — Z7901 Long term (current) use of anticoagulants: Secondary | ICD-10-CM | POA: Diagnosis not present

## 2022-07-05 DIAGNOSIS — Z7952 Long term (current) use of systemic steroids: Secondary | ICD-10-CM | POA: Diagnosis not present

## 2022-07-05 DIAGNOSIS — K219 Gastro-esophageal reflux disease without esophagitis: Secondary | ICD-10-CM | POA: Diagnosis not present

## 2022-07-05 DIAGNOSIS — J440 Chronic obstructive pulmonary disease with acute lower respiratory infection: Secondary | ICD-10-CM | POA: Diagnosis not present

## 2022-07-05 DIAGNOSIS — Z6830 Body mass index (BMI) 30.0-30.9, adult: Secondary | ICD-10-CM | POA: Diagnosis not present

## 2022-07-05 DIAGNOSIS — J441 Chronic obstructive pulmonary disease with (acute) exacerbation: Secondary | ICD-10-CM | POA: Diagnosis not present

## 2022-07-05 DIAGNOSIS — G894 Chronic pain syndrome: Secondary | ICD-10-CM | POA: Diagnosis not present

## 2022-07-05 DIAGNOSIS — I110 Hypertensive heart disease with heart failure: Secondary | ICD-10-CM | POA: Diagnosis not present

## 2022-07-05 DIAGNOSIS — M1711 Unilateral primary osteoarthritis, right knee: Secondary | ICD-10-CM | POA: Diagnosis not present

## 2022-07-05 DIAGNOSIS — I480 Paroxysmal atrial fibrillation: Secondary | ICD-10-CM | POA: Diagnosis not present

## 2022-07-05 DIAGNOSIS — F419 Anxiety disorder, unspecified: Secondary | ICD-10-CM | POA: Diagnosis not present

## 2022-07-05 DIAGNOSIS — M47814 Spondylosis without myelopathy or radiculopathy, thoracic region: Secondary | ICD-10-CM | POA: Diagnosis not present

## 2022-07-05 DIAGNOSIS — E782 Mixed hyperlipidemia: Secondary | ICD-10-CM | POA: Diagnosis not present

## 2022-07-05 DIAGNOSIS — G2581 Restless legs syndrome: Secondary | ICD-10-CM | POA: Diagnosis not present

## 2022-07-07 DIAGNOSIS — I5043 Acute on chronic combined systolic (congestive) and diastolic (congestive) heart failure: Secondary | ICD-10-CM | POA: Diagnosis not present

## 2022-07-07 DIAGNOSIS — E782 Mixed hyperlipidemia: Secondary | ICD-10-CM | POA: Diagnosis not present

## 2022-07-07 DIAGNOSIS — Z7952 Long term (current) use of systemic steroids: Secondary | ICD-10-CM | POA: Diagnosis not present

## 2022-07-07 DIAGNOSIS — E119 Type 2 diabetes mellitus without complications: Secondary | ICD-10-CM | POA: Diagnosis not present

## 2022-07-07 DIAGNOSIS — K219 Gastro-esophageal reflux disease without esophagitis: Secondary | ICD-10-CM | POA: Diagnosis not present

## 2022-07-07 DIAGNOSIS — I480 Paroxysmal atrial fibrillation: Secondary | ICD-10-CM | POA: Diagnosis not present

## 2022-07-07 DIAGNOSIS — Z7984 Long term (current) use of oral hypoglycemic drugs: Secondary | ICD-10-CM | POA: Diagnosis not present

## 2022-07-07 DIAGNOSIS — G2581 Restless legs syndrome: Secondary | ICD-10-CM | POA: Diagnosis not present

## 2022-07-07 DIAGNOSIS — M47814 Spondylosis without myelopathy or radiculopathy, thoracic region: Secondary | ICD-10-CM | POA: Diagnosis not present

## 2022-07-07 DIAGNOSIS — Z7901 Long term (current) use of anticoagulants: Secondary | ICD-10-CM | POA: Diagnosis not present

## 2022-07-07 DIAGNOSIS — Z6830 Body mass index (BMI) 30.0-30.9, adult: Secondary | ICD-10-CM | POA: Diagnosis not present

## 2022-07-07 DIAGNOSIS — J441 Chronic obstructive pulmonary disease with (acute) exacerbation: Secondary | ICD-10-CM | POA: Diagnosis not present

## 2022-07-07 DIAGNOSIS — I255 Ischemic cardiomyopathy: Secondary | ICD-10-CM | POA: Diagnosis not present

## 2022-07-07 DIAGNOSIS — G7000 Myasthenia gravis without (acute) exacerbation: Secondary | ICD-10-CM | POA: Diagnosis not present

## 2022-07-07 DIAGNOSIS — G894 Chronic pain syndrome: Secondary | ICD-10-CM | POA: Diagnosis not present

## 2022-07-07 DIAGNOSIS — Z556 Problems related to health literacy: Secondary | ICD-10-CM | POA: Diagnosis not present

## 2022-07-07 DIAGNOSIS — I110 Hypertensive heart disease with heart failure: Secondary | ICD-10-CM | POA: Diagnosis not present

## 2022-07-07 DIAGNOSIS — J440 Chronic obstructive pulmonary disease with acute lower respiratory infection: Secondary | ICD-10-CM | POA: Diagnosis not present

## 2022-07-07 DIAGNOSIS — F419 Anxiety disorder, unspecified: Secondary | ICD-10-CM | POA: Diagnosis not present

## 2022-07-07 DIAGNOSIS — J9601 Acute respiratory failure with hypoxia: Secondary | ICD-10-CM | POA: Diagnosis not present

## 2022-07-07 DIAGNOSIS — M1990 Unspecified osteoarthritis, unspecified site: Secondary | ICD-10-CM | POA: Diagnosis not present

## 2022-07-07 DIAGNOSIS — J189 Pneumonia, unspecified organism: Secondary | ICD-10-CM | POA: Diagnosis not present

## 2022-07-07 DIAGNOSIS — I2510 Atherosclerotic heart disease of native coronary artery without angina pectoris: Secondary | ICD-10-CM | POA: Diagnosis not present

## 2022-07-08 ENCOUNTER — Other Ambulatory Visit: Payer: Self-pay | Admitting: Family Medicine

## 2022-07-10 NOTE — Telephone Encounter (Signed)
Requested Prescriptions  Pending Prescriptions Disp Refills  . pramipexole (MIRAPEX) 0.5 MG tablet [Pharmacy Med Name: PRAMIPEXOLE 0.5 MG TABLET] 90 tablet 0    Sig: TAKE ONE TABLET BY MOUTH AT BEDTIME     Neurology:  Parkinsonian Agents Passed - 07/08/2022  6:51 AM      Passed - Last BP in normal range    BP Readings from Last 1 Encounters:  06/29/22 (!) 117/58         Passed - Last Heart Rate in normal range    Pulse Readings from Last 1 Encounters:  06/29/22 75         Passed - Valid encounter within last 12 months    Recent Outpatient Visits          2 weeks ago [place], Donald E, MD   1 month ago [place] Mikey Kirschner, PA-C   1 year ago Encounter for immunization   [facility] [name], MD   2 years ago Laceration of left lower extremity, sequela   [facility], Clearnce Sorrel, [place]   2 years ago Laceration of left lower extremity, sequela   [facility], Clearnce Sorrel, PA-C      Future Appointments            In 48 month [name], Kathlene November, MD [facility], [place]

## 2022-07-12 DIAGNOSIS — M1711 Unilateral primary osteoarthritis, right knee: Secondary | ICD-10-CM | POA: Diagnosis not present

## 2022-07-13 DIAGNOSIS — J441 Chronic obstructive pulmonary disease with (acute) exacerbation: Secondary | ICD-10-CM | POA: Diagnosis not present

## 2022-07-13 DIAGNOSIS — J9601 Acute respiratory failure with hypoxia: Secondary | ICD-10-CM | POA: Diagnosis not present

## 2022-07-13 DIAGNOSIS — K219 Gastro-esophageal reflux disease without esophagitis: Secondary | ICD-10-CM | POA: Diagnosis not present

## 2022-07-13 DIAGNOSIS — Z7901 Long term (current) use of anticoagulants: Secondary | ICD-10-CM | POA: Diagnosis not present

## 2022-07-13 DIAGNOSIS — E782 Mixed hyperlipidemia: Secondary | ICD-10-CM | POA: Diagnosis not present

## 2022-07-13 DIAGNOSIS — F419 Anxiety disorder, unspecified: Secondary | ICD-10-CM | POA: Diagnosis not present

## 2022-07-13 DIAGNOSIS — E119 Type 2 diabetes mellitus without complications: Secondary | ICD-10-CM | POA: Diagnosis not present

## 2022-07-13 DIAGNOSIS — I255 Ischemic cardiomyopathy: Secondary | ICD-10-CM | POA: Diagnosis not present

## 2022-07-13 DIAGNOSIS — G2581 Restless legs syndrome: Secondary | ICD-10-CM | POA: Diagnosis not present

## 2022-07-13 DIAGNOSIS — M47814 Spondylosis without myelopathy or radiculopathy, thoracic region: Secondary | ICD-10-CM | POA: Diagnosis not present

## 2022-07-13 DIAGNOSIS — Z7984 Long term (current) use of oral hypoglycemic drugs: Secondary | ICD-10-CM | POA: Diagnosis not present

## 2022-07-13 DIAGNOSIS — J189 Pneumonia, unspecified organism: Secondary | ICD-10-CM | POA: Diagnosis not present

## 2022-07-13 DIAGNOSIS — J440 Chronic obstructive pulmonary disease with acute lower respiratory infection: Secondary | ICD-10-CM | POA: Diagnosis not present

## 2022-07-13 DIAGNOSIS — I2510 Atherosclerotic heart disease of native coronary artery without angina pectoris: Secondary | ICD-10-CM | POA: Diagnosis not present

## 2022-07-13 DIAGNOSIS — M1990 Unspecified osteoarthritis, unspecified site: Secondary | ICD-10-CM | POA: Diagnosis not present

## 2022-07-13 DIAGNOSIS — Z556 Problems related to health literacy: Secondary | ICD-10-CM | POA: Diagnosis not present

## 2022-07-13 DIAGNOSIS — G7000 Myasthenia gravis without (acute) exacerbation: Secondary | ICD-10-CM | POA: Diagnosis not present

## 2022-07-13 DIAGNOSIS — Z6830 Body mass index (BMI) 30.0-30.9, adult: Secondary | ICD-10-CM | POA: Diagnosis not present

## 2022-07-13 DIAGNOSIS — I480 Paroxysmal atrial fibrillation: Secondary | ICD-10-CM | POA: Diagnosis not present

## 2022-07-13 DIAGNOSIS — G894 Chronic pain syndrome: Secondary | ICD-10-CM | POA: Diagnosis not present

## 2022-07-13 DIAGNOSIS — I110 Hypertensive heart disease with heart failure: Secondary | ICD-10-CM | POA: Diagnosis not present

## 2022-07-13 DIAGNOSIS — Z7952 Long term (current) use of systemic steroids: Secondary | ICD-10-CM | POA: Diagnosis not present

## 2022-07-13 DIAGNOSIS — M179 Osteoarthritis of knee, unspecified: Secondary | ICD-10-CM | POA: Insufficient documentation

## 2022-07-13 DIAGNOSIS — I5043 Acute on chronic combined systolic (congestive) and diastolic (congestive) heart failure: Secondary | ICD-10-CM | POA: Diagnosis not present

## 2022-07-17 DIAGNOSIS — H40053 Ocular hypertension, bilateral: Secondary | ICD-10-CM | POA: Diagnosis not present

## 2022-07-19 ENCOUNTER — Other Ambulatory Visit: Payer: Self-pay | Admitting: Family Medicine

## 2022-07-19 DIAGNOSIS — M1711 Unilateral primary osteoarthritis, right knee: Secondary | ICD-10-CM | POA: Diagnosis not present

## 2022-07-19 DIAGNOSIS — I509 Heart failure, unspecified: Secondary | ICD-10-CM | POA: Diagnosis not present

## 2022-07-19 DIAGNOSIS — M9979 Connective tissue and disc stenosis of intervertebral foramina of abdomen and other regions: Secondary | ICD-10-CM

## 2022-07-19 MED ORDER — HYDROCODONE-ACETAMINOPHEN 5-325 MG PO TABS: 1.0000 | ORAL_TABLET | Freq: Four times a day (QID) | ORAL | 0 refills | Status: DC | PRN

## 2022-07-28 DIAGNOSIS — Z6835 Body mass index (BMI) 35.0-35.9, adult: Secondary | ICD-10-CM | POA: Diagnosis not present

## 2022-07-28 DIAGNOSIS — Z515 Encounter for palliative care: Secondary | ICD-10-CM | POA: Diagnosis not present

## 2022-07-28 DIAGNOSIS — G7000 Myasthenia gravis without (acute) exacerbation: Secondary | ICD-10-CM | POA: Diagnosis not present

## 2022-07-28 DIAGNOSIS — I5030 Unspecified diastolic (congestive) heart failure: Secondary | ICD-10-CM | POA: Diagnosis not present

## 2022-07-28 DIAGNOSIS — J449 Chronic obstructive pulmonary disease, unspecified: Secondary | ICD-10-CM | POA: Diagnosis not present

## 2022-07-28 DIAGNOSIS — Z7951 Long term (current) use of inhaled steroids: Secondary | ICD-10-CM | POA: Diagnosis not present

## 2022-07-28 DIAGNOSIS — G4733 Obstructive sleep apnea (adult) (pediatric): Secondary | ICD-10-CM | POA: Diagnosis not present

## 2022-07-28 DIAGNOSIS — I110 Hypertensive heart disease with heart failure: Secondary | ICD-10-CM | POA: Diagnosis not present

## 2022-07-31 DIAGNOSIS — Z7901 Long term (current) use of anticoagulants: Secondary | ICD-10-CM | POA: Diagnosis not present

## 2022-07-31 DIAGNOSIS — E782 Mixed hyperlipidemia: Secondary | ICD-10-CM | POA: Diagnosis not present

## 2022-07-31 DIAGNOSIS — G7000 Myasthenia gravis without (acute) exacerbation: Secondary | ICD-10-CM | POA: Diagnosis not present

## 2022-07-31 DIAGNOSIS — I2510 Atherosclerotic heart disease of native coronary artery without angina pectoris: Secondary | ICD-10-CM | POA: Diagnosis not present

## 2022-07-31 DIAGNOSIS — I480 Paroxysmal atrial fibrillation: Secondary | ICD-10-CM | POA: Diagnosis not present

## 2022-07-31 DIAGNOSIS — Z6830 Body mass index (BMI) 30.0-30.9, adult: Secondary | ICD-10-CM | POA: Diagnosis not present

## 2022-07-31 DIAGNOSIS — Z7952 Long term (current) use of systemic steroids: Secondary | ICD-10-CM | POA: Diagnosis not present

## 2022-07-31 DIAGNOSIS — F419 Anxiety disorder, unspecified: Secondary | ICD-10-CM | POA: Diagnosis not present

## 2022-07-31 DIAGNOSIS — I110 Hypertensive heart disease with heart failure: Secondary | ICD-10-CM | POA: Diagnosis not present

## 2022-07-31 DIAGNOSIS — Z7984 Long term (current) use of oral hypoglycemic drugs: Secondary | ICD-10-CM | POA: Diagnosis not present

## 2022-07-31 DIAGNOSIS — Z556 Problems related to health literacy: Secondary | ICD-10-CM | POA: Diagnosis not present

## 2022-07-31 DIAGNOSIS — J441 Chronic obstructive pulmonary disease with (acute) exacerbation: Secondary | ICD-10-CM | POA: Diagnosis not present

## 2022-07-31 DIAGNOSIS — M1990 Unspecified osteoarthritis, unspecified site: Secondary | ICD-10-CM | POA: Diagnosis not present

## 2022-07-31 DIAGNOSIS — K219 Gastro-esophageal reflux disease without esophagitis: Secondary | ICD-10-CM | POA: Diagnosis not present

## 2022-07-31 DIAGNOSIS — G2581 Restless legs syndrome: Secondary | ICD-10-CM | POA: Diagnosis not present

## 2022-07-31 DIAGNOSIS — M47814 Spondylosis without myelopathy or radiculopathy, thoracic region: Secondary | ICD-10-CM | POA: Diagnosis not present

## 2022-07-31 DIAGNOSIS — J440 Chronic obstructive pulmonary disease with acute lower respiratory infection: Secondary | ICD-10-CM | POA: Diagnosis not present

## 2022-07-31 DIAGNOSIS — G894 Chronic pain syndrome: Secondary | ICD-10-CM | POA: Diagnosis not present

## 2022-07-31 DIAGNOSIS — I255 Ischemic cardiomyopathy: Secondary | ICD-10-CM | POA: Diagnosis not present

## 2022-07-31 DIAGNOSIS — E119 Type 2 diabetes mellitus without complications: Secondary | ICD-10-CM | POA: Diagnosis not present

## 2022-07-31 DIAGNOSIS — J189 Pneumonia, unspecified organism: Secondary | ICD-10-CM | POA: Diagnosis not present

## 2022-07-31 DIAGNOSIS — I5043 Acute on chronic combined systolic (congestive) and diastolic (congestive) heart failure: Secondary | ICD-10-CM | POA: Diagnosis not present

## 2022-07-31 DIAGNOSIS — J9601 Acute respiratory failure with hypoxia: Secondary | ICD-10-CM | POA: Diagnosis not present

## 2022-08-03 ENCOUNTER — Encounter: Payer: Self-pay | Admitting: Family Medicine

## 2022-08-07 ENCOUNTER — Telehealth: Payer: Self-pay | Admitting: Cardiovascular Disease

## 2022-08-07 ENCOUNTER — Ambulatory Visit: Payer: PPO | Attending: Family | Admitting: Family

## 2022-08-07 ENCOUNTER — Encounter: Payer: Self-pay | Admitting: Family

## 2022-08-07 VITALS — BP 98/57 | HR 87 | Resp 16 | Ht 67.0 in | Wt 225.1 lb

## 2022-08-07 DIAGNOSIS — Z87891 Personal history of nicotine dependence: Secondary | ICD-10-CM | POA: Insufficient documentation

## 2022-08-07 DIAGNOSIS — I890 Lymphedema, not elsewhere classified: Secondary | ICD-10-CM

## 2022-08-07 DIAGNOSIS — E119 Type 2 diabetes mellitus without complications: Secondary | ICD-10-CM | POA: Diagnosis not present

## 2022-08-07 DIAGNOSIS — I10 Essential (primary) hypertension: Secondary | ICD-10-CM

## 2022-08-07 DIAGNOSIS — I110 Hypertensive heart disease with heart failure: Secondary | ICD-10-CM | POA: Insufficient documentation

## 2022-08-07 DIAGNOSIS — I5022 Chronic systolic (congestive) heart failure: Secondary | ICD-10-CM | POA: Diagnosis not present

## 2022-08-07 DIAGNOSIS — J449 Chronic obstructive pulmonary disease, unspecified: Secondary | ICD-10-CM | POA: Diagnosis not present

## 2022-08-07 DIAGNOSIS — G7000 Myasthenia gravis without (acute) exacerbation: Secondary | ICD-10-CM | POA: Diagnosis not present

## 2022-08-07 DIAGNOSIS — F419 Anxiety disorder, unspecified: Secondary | ICD-10-CM | POA: Insufficient documentation

## 2022-08-07 DIAGNOSIS — E785 Hyperlipidemia, unspecified: Secondary | ICD-10-CM | POA: Diagnosis not present

## 2022-08-07 DIAGNOSIS — I5032 Chronic diastolic (congestive) heart failure: Secondary | ICD-10-CM | POA: Diagnosis not present

## 2022-08-07 NOTE — Patient Instructions (Addendum)
Continue weighing daily and call for an overnight weight gain of 3 pounds or more or a weekly weight gain of more than 5 pounds.  Call for appointment when needed.

## 2022-08-07 NOTE — Telephone Encounter (Signed)
Fax received from Joseph Palms, RN with Joseph Macdonald.   [address], [place] [address]  [phone]   Fax states:  I am a registered nurse with [facility].  Joseph Macdonald is also a patient under the care of Dr. Bonney Leitz with our Post-Acute Care program. After Joseph Macdonald's most recent visit, we noted the following medication changes that we wanted to bring to your attention:  Medication: KCL supplements How did this medication change?: DISCONTINUED  Medication: Digoxin 0.125 mg QD How did this medication change? Hold for 5 days to see if abdominal issues resolve, if not, then resume  Medication: Entresto 49/51 mg BID How did this medication change? INCREASED from 24/26 mg   Note: Interactive patient chart available online  To accompany this letter, Joseph Palms, RN has prepared an interactive patient chart for you to view online. Anyone in your practice can access the chart following the instructions below: Go to [email or web address] Enter this Reference Code: [email or web address]   To Dr. Rockey Situ as an Juluis Rainier

## 2022-08-07 NOTE — Progress Notes (Signed)
Patient ID: Joseph Macdonald, male    DOB: 07-13-1946, 76 y.o.   MRN: [id number]  HPI  Joseph Macdonald is a 76 y/o male with a history of DM, hyperlipidemia, HTN, anxiety, COPD, myasthenia gravis, VT, previous tobacco use and chronic heart failure.   Echo report from 04/30/22 reviewed and showed an EF of 35-40% along with mild LVH.  RHC/LHC done 05/16/22 and showed: Conclusions: Moderate single-vessel coronary artery disease with 60% mid LAD stenosis that is borderline significant by iFR (iFR = 0.90). No angiographically significant coronary artery disease observed in the LCx or RCA. Upper normal to mildly elevated left heart filling pressures (LVEDP and PCWP 18 mmHg). Moderate pulmonary hypertension (mean PAP 34 mmHg, PVR 2.9 WU).  Moderately-severely elevated right heart filling pressure (mean RAP 15 mmHg, RVEDP 14 mmHg). Normal Fick cardiac output/index (CO 5.5 L/min, CI 2.6 L/min/m).  Recommendations: Favor medical management of moderate LAD disease that is borderline significant by IFR but supplies a relatively small territory as the LV apex is perfused by branches from the distal RCA. Secondary prevention of coronary artery disease. Continue gentle diuresis.  Respiratory failure seems to be out of proportion to left heart failure.  Elevated pulmonary artery and right heart pressures are more consistent with primary pulmonary process driving the patient's respiratory failure. Restart IV heparin 2 hours after TR band has been removed.  Transition to [place] as soon as tomorrow if there is no evidence of bleeding/vascular injury at catheterization sites.  Admitted 06/08/22 due to cough and shortness of breath due to HF/COPD exacerbation. Following admission, patient reported issues with leg weakness and speech difficulty and possibly swallowing, concerning for myasthenia flare. Neurology consult obtained. Received increased dose of pyridostigmine and weakness symptoms improved. Metoprolol stopped.  Patient refused bipap. Diuresed >18L. IV steroids and antibiotics given. Discharged after 5 days.   He presents today for a follow-up visit with a chief complaint of moderate fatigue with minimal exertion, cough, wheezing, SOB, swelling in the lower extremities and abdomen, dizziness and blurred vision. Describes this as chronic in nature.  Has associated light-headedness, easy bruising, chronic pain, abdominal distention and chronic difficulty sleeping along with this. He denies any palpitations, chest pain/pressure, or weight gain.   He monitors his weight daily and tries and follow a low-sodium diet. Does not monitor fluids but is drinking less.  Rarely drinks alcohol only little amount at holidays. Does not smoke tobacco he quit in 2018. Monitors his blood sugar once daily and it was 133 this AM.Takes '15mg'$  prednisone daily for his myasthenia gravis Says that he's worn compression socks in the past but they cut into his legs therefore he no longer wears them. Did go home from the hospital with compression boots but hasn't started wearing them yet.  He reports a history of hyperkalemia as well as hypokalemia. Currently is off of his potassium supplements.   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   Past Medical History:  Diagnosis Date   Anxiety    Arthritis    CHF (congestive heart failure) (HCC)    COPD (chronic obstructive pulmonary disease) (HCC)    Diabetes mellitus without complication (HCC)    diet controlled   Dyspnea    with activity   Hyperlipidemia    Hypertension    Myasthenia gravis ([place])    Oxygen deficit    Ventricular  tachycardia [facility])    Past Surgical History:  Procedure Laterality Date   carpel tunnel sx     CATARACT EXTRACTION     right eye   CATARACT EXTRACTION W/PHACO Left 12/01/2020   Procedure: CATARACT EXTRACTION PHACO AND INTRAOCULAR LENS PLACEMENT (IOC) LEFT 10.83  01:14.1 14.6%;  Surgeon: Leandrew Koyanagi, MD;  Location: [facility];  Service: Ophthalmology;  Laterality: Left;   INTRAVASCULAR PRESSURE WIRE/FFR STUDY N/A 05/16/2022   Procedure: INTRAVASCULAR PRESSURE WIRE/FFR STUDY;  Surgeon: Nelva Bush, MD;  Location: [facility];  Service: Cardiovascular;  Laterality: N/A;   RETINAL DETACHMENT SURGERY     RIGHT/LEFT HEART CATH AND CORONARY ANGIOGRAPHY N/A 05/16/2022   Procedure: RIGHT/LEFT HEART CATH AND CORONARY ANGIOGRAPHY;  Surgeon: Nelva Bush, MD;  Location: [facility];  Service: Cardiovascular;  Laterality: N/A;   SKIN GRAFT     Behind left knee   TONSILLECTOMY AND ADENOIDECTOMY     Family History  Problem Relation Age of Onset   Hypertension Mother    Thyroid disease Mother    Kidney disease Mother    Arthritis Mother    Stroke Father    Heart disease Father 94   Arthritis Father    Heart attack Father    Arthritis Other    Social History   Tobacco Use   Smoking status: Former    Packs/day: 0.50    Years: 40.00    Total pack years: 20.00    Types: Pipe, Cigarettes    Quit date: 08/19/2017    Years since quitting: 4.9   Smokeless tobacco: Former    Quit date: 08/19/2017   Tobacco comments:    previously smoked 1 1/2 pack since his 76 years old  Quit 2018  Substance Use Topics   Alcohol use: Yes    Comment: occasionally- monthly or less   Allergies  Allergen Reactions   Lisinopril Swelling   Prior to Admission medications   Medication Sig Start Date End Date Taking? Authorizing Provider  albuterol (PROVENTIL HFA;VENTOLIN HFA) 108 (90 Base) MCG/ACT inhaler Inhale into the lungs every 6 (six) hours as needed for wheezing or shortness of breath.   Yes [provider]  apixaban (ELIQUIS) 5 MG TABS tablet Take 5 mg by mouth 2 (two) times daily.   Yes [provider]  cholecalciferol (VITAMIN D) 25 MCG (1000 UNIT) tablet Take 2,000 Units by mouth daily.   Yes [provider]  empagliflozin (JARDIANCE) 25 MG TABS tablet Take 25 mg by mouth daily.   Yes [provider]  HYDROcodone-acetaminophen (NORCO/VICODIN) 5-325 MG tablet Take 1 tablet by mouth every 6 (six) hours as needed for moderate pain. 07/19/22  Yes [name], MD  metoprolol succinate (TOPROL-XL) 25 MG 24 hr tablet Take 25 mg by mouth daily.   Yes [provider]  OLANZapine zydis (ZYPREXA) 5 MG disintegrating tablet Take 1 tablet (5 mg total) by mouth 2 (two) times daily. 06/20/22  Yes [name], MD  omeprazole (PRILOSEC) 40 MG capsule Take 40 mg by mouth 2 (two) times daily.    Yes [provider]  pramipexole (MIRAPEX) 0.5 MG tablet TAKE ONE TABLET BY MOUTH AT BEDTIME 07/10/22  Yes [name], MD  pravastatin (PRAVACHOL) 20 MG tablet Take 20 mg by mouth daily.   Yes [provider]  pyridostigmine (MESTINON) 60 MG tablet Take 1 tablet (60 mg total) by mouth 3 (three) times daily. 06/13/22  Yes Annita Brod, MD  sacubitril-valsartan (ENTRESTO) 49-51 MG Take 1 tablet by mouth 2 (two) times daily.   Yes [provider]  tiotropium (SPIRIVA) 18 MCG inhalation capsule Place 18 mcg into inhaler and inhale daily.   Yes [provider]  torsemide (DEMADEX) 20 MG tablet Take 20 mg by mouth daily.   Yes [provider]  vitamin B-12 (CYANOCOBALAMIN) 1000 MCG tablet Take 1,000 mcg by mouth daily.   Yes [provider]  budesonide-formoterol (SYMBICORT) 160-4.5 MCG/ACT inhaler Inhale 2 puffs into the lungs 2 (two) times daily. Patient not taking: Reported on 08/07/2022 06/16/19   [name], MD  diazepam (VALIUM) 5 MG tablet Take 5 mg by mouth every 6 (six) hours as needed for anxiety. Patient not taking: Reported on 08/07/2022    [provider]  digoxin (LANOXIN) 0.125 MG tablet Take 1 tablet (0.125 mg total) by mouth daily. Patient not taking: Reported on 08/07/2022 05/18/22   Max Sane, MD   flecainide (TAMBOCOR) 50 MG tablet Take 50 mg by mouth 2 (two) times daily. Patient not taking: Reported on 06/29/2022    [provider]  spironolactone (ALDACTONE) 25 MG tablet Take 25 mg by mouth daily. Patient not taking: Reported on 08/07/2022 07/05/22   [provider]  terbinafine (LAMISIL) 250 MG tablet Take 250 mg by mouth daily. Patient not taking: Reported on 08/07/2022 06/03/21   [provider]   Review of Systems  Constitutional:  Positive for fatigue (easily). Negative for appetite change.  HENT:  Negative for congestion, postnasal drip and sore throat.   Eyes: Negative.   Respiratory:  Positive for cough, shortness of breath (easily) and wheezing. Negative for chest tightness.   Cardiovascular:  Positive for leg swelling. Negative for chest pain and palpitations.  Gastrointestinal:  Positive for abdominal distention. Negative for abdominal pain.  Endocrine: Negative.   Genitourinary: Negative.   Musculoskeletal:  Positive for arthralgias and back pain.  Skin: Negative.   Allergic/Immunologic: Negative.   Neurological:  Positive for dizziness and light-headedness (with sudden position changes).  Hematological:  Negative for adenopathy. Bruises/bleeds easily.  Psychiatric/Behavioral:  Positive for sleep disturbance (chronic trouble sleeping at night; sleeping on 1 pillow). Negative for dysphoric mood. The patient is not nervous/anxious.    Vitals:   08/07/22 1245  BP: (!) 98/57  Pulse: 87  Resp: 16  SpO2: 92%    Filed Weights   08/07/22 1245  Weight: 225 lb 2 oz (102.1 kg)    Lab Results  Component Value Date   CREATININE 0.88 06/29/2022   CREATININE 0.81 06/13/2022   CREATININE 0.84 06/12/2022    Physical Exam Vitals and nursing note reviewed. Exam conducted with a chaperone present (wife).  Constitutional:      Appearance: Normal appearance.  HENT:     Head: Normocephalic and atraumatic.  Cardiovascular:     Rate and Rhythm:  Normal rate and regular rhythm.  Pulmonary:     Effort: Pulmonary effort is normal. No respiratory distress.     Breath sounds: No wheezing or rales.  Abdominal:     General: There is no distension.     Palpations: Abdomen is soft.  Musculoskeletal:        General: No tenderness.     Cervical back: Normal range of motion and neck supple.     Right lower leg: No tenderness. Edema (trace pitting) present.     Left lower leg: No tenderness. Edema (trace pitting) present.  Skin:    General: Skin is warm  and dry.  Neurological:     General: No focal deficit present.     Mental Status: He is alert and oriented to person, place, and time.  Psychiatric:        Mood and Affect: Mood normal.        Behavior: Behavior normal.        Thought Content: Thought content normal.   Assessment & Plan:  1: Chronic heart failure with reduced ejection fraction- - NYHA class III - euvolemic today - weighing daily and understands to call for an overnight weight gain of > 2 pounds or a weekly weight gain of > 5 pounds - weight today 225 down 9 lbs since last visit 06/29/22 - adding less salt and is now trying to use Mrs Deliah Boston for seasoning; reviewed the importance of not using table salt and to keep daily sodium intake to '2000mg'$ ; used to like drinking V8 juice until he read how much sodium is in 1 serving - unsure of his fluid intake; he drinks 3-4 cups of water and his wife estimates the cup holds ~ 32 ounces; he also will drink milk and tea; instructed to measure how much that cup holds and that his daily fluid intake should be between 60-64 ounces daily - saw cardiology Rockey Situ) 05/22/22 - on GDMT of empagliflozin, metoprolol and entresto - has had swelling with lisinopril  - BNP 06/08/22 was 714.7 - patient advised to call ventricle provider or [name] and find out about resuming digoxin and spironolactone  2: HTN- - BP  (98/54) - saw PCP Caryn Section) 06/20/22 - BMP 06/29/22 reviewed and showed sodium 142,  potassium 4.2, creatinine 0.88 and GFR >60  3: DM- - A1c 04/30/22 was 6.9% - BS 133 - diet controlled  4: COPD- - using spiriva and PRN albuterol - no wheezing noted  5: Myasthenia Gravis- - sees neurology at the [facility] - takes prednisone '15mg'$  daily but currently doing longer prednisone taper due to recent flare  6: Lymphedema- - stage 2 - elevates his legs with slight improvement of edema - has worn compression socks in the past but they cut into his legs up by his knees - limited in his ability to exercise due to his shortness of breath - wife says that he came home from the hospital with compression boots but he hasn't been using them yet; instructed to start using them 1-2 times/ day for 60 minutes at a time   Medication list reviewed.   Patient to follow up with [place] if needed.

## 2022-08-08 DIAGNOSIS — J189 Pneumonia, unspecified organism: Secondary | ICD-10-CM | POA: Diagnosis not present

## 2022-08-08 DIAGNOSIS — E782 Mixed hyperlipidemia: Secondary | ICD-10-CM | POA: Diagnosis not present

## 2022-08-08 DIAGNOSIS — I5043 Acute on chronic combined systolic (congestive) and diastolic (congestive) heart failure: Secondary | ICD-10-CM | POA: Diagnosis not present

## 2022-08-08 DIAGNOSIS — I480 Paroxysmal atrial fibrillation: Secondary | ICD-10-CM | POA: Diagnosis not present

## 2022-08-08 DIAGNOSIS — F419 Anxiety disorder, unspecified: Secondary | ICD-10-CM | POA: Diagnosis not present

## 2022-08-08 DIAGNOSIS — M47814 Spondylosis without myelopathy or radiculopathy, thoracic region: Secondary | ICD-10-CM | POA: Diagnosis not present

## 2022-08-08 DIAGNOSIS — I255 Ischemic cardiomyopathy: Secondary | ICD-10-CM | POA: Diagnosis not present

## 2022-08-08 DIAGNOSIS — K219 Gastro-esophageal reflux disease without esophagitis: Secondary | ICD-10-CM | POA: Diagnosis not present

## 2022-08-08 DIAGNOSIS — I2510 Atherosclerotic heart disease of native coronary artery without angina pectoris: Secondary | ICD-10-CM | POA: Diagnosis not present

## 2022-08-08 DIAGNOSIS — G2581 Restless legs syndrome: Secondary | ICD-10-CM | POA: Diagnosis not present

## 2022-08-08 DIAGNOSIS — Z7901 Long term (current) use of anticoagulants: Secondary | ICD-10-CM | POA: Diagnosis not present

## 2022-08-08 DIAGNOSIS — J9601 Acute respiratory failure with hypoxia: Secondary | ICD-10-CM | POA: Diagnosis not present

## 2022-08-08 DIAGNOSIS — E119 Type 2 diabetes mellitus without complications: Secondary | ICD-10-CM | POA: Diagnosis not present

## 2022-08-08 DIAGNOSIS — Z7984 Long term (current) use of oral hypoglycemic drugs: Secondary | ICD-10-CM | POA: Diagnosis not present

## 2022-08-08 DIAGNOSIS — G894 Chronic pain syndrome: Secondary | ICD-10-CM | POA: Diagnosis not present

## 2022-08-08 DIAGNOSIS — Z6830 Body mass index (BMI) 30.0-30.9, adult: Secondary | ICD-10-CM | POA: Diagnosis not present

## 2022-08-08 DIAGNOSIS — M1990 Unspecified osteoarthritis, unspecified site: Secondary | ICD-10-CM | POA: Diagnosis not present

## 2022-08-08 DIAGNOSIS — I110 Hypertensive heart disease with heart failure: Secondary | ICD-10-CM | POA: Diagnosis not present

## 2022-08-08 DIAGNOSIS — J441 Chronic obstructive pulmonary disease with (acute) exacerbation: Secondary | ICD-10-CM | POA: Diagnosis not present

## 2022-08-08 DIAGNOSIS — J440 Chronic obstructive pulmonary disease with acute lower respiratory infection: Secondary | ICD-10-CM | POA: Diagnosis not present

## 2022-08-08 DIAGNOSIS — Z7952 Long term (current) use of systemic steroids: Secondary | ICD-10-CM | POA: Diagnosis not present

## 2022-08-08 DIAGNOSIS — G7000 Myasthenia gravis without (acute) exacerbation: Secondary | ICD-10-CM | POA: Diagnosis not present

## 2022-08-08 DIAGNOSIS — Z556 Problems related to health literacy: Secondary | ICD-10-CM | POA: Diagnosis not present

## 2022-08-15 DIAGNOSIS — E119 Type 2 diabetes mellitus without complications: Secondary | ICD-10-CM | POA: Diagnosis not present

## 2022-08-15 DIAGNOSIS — I480 Paroxysmal atrial fibrillation: Secondary | ICD-10-CM | POA: Diagnosis not present

## 2022-08-15 DIAGNOSIS — G894 Chronic pain syndrome: Secondary | ICD-10-CM | POA: Diagnosis not present

## 2022-08-15 DIAGNOSIS — I255 Ischemic cardiomyopathy: Secondary | ICD-10-CM | POA: Diagnosis not present

## 2022-08-15 DIAGNOSIS — Z7952 Long term (current) use of systemic steroids: Secondary | ICD-10-CM | POA: Diagnosis not present

## 2022-08-15 DIAGNOSIS — G7000 Myasthenia gravis without (acute) exacerbation: Secondary | ICD-10-CM | POA: Diagnosis not present

## 2022-08-15 DIAGNOSIS — J9601 Acute respiratory failure with hypoxia: Secondary | ICD-10-CM | POA: Diagnosis not present

## 2022-08-15 DIAGNOSIS — Z6830 Body mass index (BMI) 30.0-30.9, adult: Secondary | ICD-10-CM | POA: Diagnosis not present

## 2022-08-15 DIAGNOSIS — M1990 Unspecified osteoarthritis, unspecified site: Secondary | ICD-10-CM | POA: Diagnosis not present

## 2022-08-15 DIAGNOSIS — J440 Chronic obstructive pulmonary disease with acute lower respiratory infection: Secondary | ICD-10-CM | POA: Diagnosis not present

## 2022-08-15 DIAGNOSIS — I2510 Atherosclerotic heart disease of native coronary artery without angina pectoris: Secondary | ICD-10-CM | POA: Diagnosis not present

## 2022-08-15 DIAGNOSIS — K219 Gastro-esophageal reflux disease without esophagitis: Secondary | ICD-10-CM | POA: Diagnosis not present

## 2022-08-15 DIAGNOSIS — Z7984 Long term (current) use of oral hypoglycemic drugs: Secondary | ICD-10-CM | POA: Diagnosis not present

## 2022-08-15 DIAGNOSIS — J441 Chronic obstructive pulmonary disease with (acute) exacerbation: Secondary | ICD-10-CM | POA: Diagnosis not present

## 2022-08-15 DIAGNOSIS — Z556 Problems related to health literacy: Secondary | ICD-10-CM | POA: Diagnosis not present

## 2022-08-15 DIAGNOSIS — F419 Anxiety disorder, unspecified: Secondary | ICD-10-CM | POA: Diagnosis not present

## 2022-08-15 DIAGNOSIS — G2581 Restless legs syndrome: Secondary | ICD-10-CM | POA: Diagnosis not present

## 2022-08-15 DIAGNOSIS — J189 Pneumonia, unspecified organism: Secondary | ICD-10-CM | POA: Diagnosis not present

## 2022-08-15 DIAGNOSIS — M47814 Spondylosis without myelopathy or radiculopathy, thoracic region: Secondary | ICD-10-CM | POA: Diagnosis not present

## 2022-08-15 DIAGNOSIS — Z7901 Long term (current) use of anticoagulants: Secondary | ICD-10-CM | POA: Diagnosis not present

## 2022-08-15 DIAGNOSIS — I5043 Acute on chronic combined systolic (congestive) and diastolic (congestive) heart failure: Secondary | ICD-10-CM | POA: Diagnosis not present

## 2022-08-15 DIAGNOSIS — I110 Hypertensive heart disease with heart failure: Secondary | ICD-10-CM | POA: Diagnosis not present

## 2022-08-15 DIAGNOSIS — E782 Mixed hyperlipidemia: Secondary | ICD-10-CM | POA: Diagnosis not present

## 2022-08-16 DIAGNOSIS — I110 Hypertensive heart disease with heart failure: Secondary | ICD-10-CM | POA: Diagnosis not present

## 2022-08-16 DIAGNOSIS — E119 Type 2 diabetes mellitus without complications: Secondary | ICD-10-CM | POA: Diagnosis not present

## 2022-08-16 DIAGNOSIS — I480 Paroxysmal atrial fibrillation: Secondary | ICD-10-CM | POA: Diagnosis not present

## 2022-08-16 DIAGNOSIS — Z7901 Long term (current) use of anticoagulants: Secondary | ICD-10-CM | POA: Diagnosis not present

## 2022-08-16 DIAGNOSIS — K219 Gastro-esophageal reflux disease without esophagitis: Secondary | ICD-10-CM | POA: Diagnosis not present

## 2022-08-16 DIAGNOSIS — Z6830 Body mass index (BMI) 30.0-30.9, adult: Secondary | ICD-10-CM | POA: Diagnosis not present

## 2022-08-16 DIAGNOSIS — G2581 Restless legs syndrome: Secondary | ICD-10-CM | POA: Diagnosis not present

## 2022-08-16 DIAGNOSIS — J441 Chronic obstructive pulmonary disease with (acute) exacerbation: Secondary | ICD-10-CM | POA: Diagnosis not present

## 2022-08-16 DIAGNOSIS — J440 Chronic obstructive pulmonary disease with acute lower respiratory infection: Secondary | ICD-10-CM | POA: Diagnosis not present

## 2022-08-16 DIAGNOSIS — J9601 Acute respiratory failure with hypoxia: Secondary | ICD-10-CM | POA: Diagnosis not present

## 2022-08-16 DIAGNOSIS — F419 Anxiety disorder, unspecified: Secondary | ICD-10-CM | POA: Diagnosis not present

## 2022-08-16 DIAGNOSIS — M1990 Unspecified osteoarthritis, unspecified site: Secondary | ICD-10-CM | POA: Diagnosis not present

## 2022-08-16 DIAGNOSIS — E782 Mixed hyperlipidemia: Secondary | ICD-10-CM | POA: Diagnosis not present

## 2022-08-16 DIAGNOSIS — Z7984 Long term (current) use of oral hypoglycemic drugs: Secondary | ICD-10-CM | POA: Diagnosis not present

## 2022-08-16 DIAGNOSIS — G7000 Myasthenia gravis without (acute) exacerbation: Secondary | ICD-10-CM | POA: Diagnosis not present

## 2022-08-16 DIAGNOSIS — M47814 Spondylosis without myelopathy or radiculopathy, thoracic region: Secondary | ICD-10-CM | POA: Diagnosis not present

## 2022-08-16 DIAGNOSIS — I5043 Acute on chronic combined systolic (congestive) and diastolic (congestive) heart failure: Secondary | ICD-10-CM | POA: Diagnosis not present

## 2022-08-16 DIAGNOSIS — I2510 Atherosclerotic heart disease of native coronary artery without angina pectoris: Secondary | ICD-10-CM | POA: Diagnosis not present

## 2022-08-16 DIAGNOSIS — J189 Pneumonia, unspecified organism: Secondary | ICD-10-CM | POA: Diagnosis not present

## 2022-08-16 DIAGNOSIS — G894 Chronic pain syndrome: Secondary | ICD-10-CM | POA: Diagnosis not present

## 2022-08-16 DIAGNOSIS — Z7952 Long term (current) use of systemic steroids: Secondary | ICD-10-CM | POA: Diagnosis not present

## 2022-08-16 DIAGNOSIS — I255 Ischemic cardiomyopathy: Secondary | ICD-10-CM | POA: Diagnosis not present

## 2022-08-16 DIAGNOSIS — Z556 Problems related to health literacy: Secondary | ICD-10-CM | POA: Diagnosis not present

## 2022-08-17 ENCOUNTER — Telehealth: Payer: Self-pay | Admitting: Cardiovascular Disease

## 2022-08-17 NOTE — Telephone Encounter (Signed)
Fax received from [facility] stating:  I am a registered nurse with [facility].  Joseph Macdonald is also a patient under the care of Dr. Bonney Leitz with our Post-Acute Care program. After Kendrell's most recent visit, we noted the following medication changes that we wanted to bring to your attention:  Medication name: Delene Loll  Dosage 24/26 mg  Instructions: 1 tab BID  How did this medication change? Decreased from 49/51 mg   If you have any questions regarding these changes, please reach out to our team.  Ranae Palms, [place] [phone] Fax: [phone]

## 2022-08-18 DIAGNOSIS — I110 Hypertensive heart disease with heart failure: Secondary | ICD-10-CM | POA: Diagnosis not present

## 2022-08-18 DIAGNOSIS — G7000 Myasthenia gravis without (acute) exacerbation: Secondary | ICD-10-CM | POA: Diagnosis not present

## 2022-08-18 DIAGNOSIS — I5043 Acute on chronic combined systolic (congestive) and diastolic (congestive) heart failure: Secondary | ICD-10-CM | POA: Diagnosis not present

## 2022-08-18 DIAGNOSIS — J441 Chronic obstructive pulmonary disease with (acute) exacerbation: Secondary | ICD-10-CM | POA: Diagnosis not present

## 2022-08-18 DIAGNOSIS — E119 Type 2 diabetes mellitus without complications: Secondary | ICD-10-CM | POA: Diagnosis not present

## 2022-08-18 DIAGNOSIS — J9601 Acute respiratory failure with hypoxia: Secondary | ICD-10-CM | POA: Diagnosis not present

## 2022-08-18 DIAGNOSIS — J440 Chronic obstructive pulmonary disease with acute lower respiratory infection: Secondary | ICD-10-CM | POA: Diagnosis not present

## 2022-08-18 DIAGNOSIS — G894 Chronic pain syndrome: Secondary | ICD-10-CM | POA: Diagnosis not present

## 2022-08-18 DIAGNOSIS — J189 Pneumonia, unspecified organism: Secondary | ICD-10-CM | POA: Diagnosis not present

## 2022-08-18 DIAGNOSIS — E782 Mixed hyperlipidemia: Secondary | ICD-10-CM | POA: Diagnosis not present

## 2022-08-21 ENCOUNTER — Other Ambulatory Visit: Payer: Self-pay | Admitting: Family Medicine

## 2022-08-21 ENCOUNTER — Encounter: Payer: Self-pay | Admitting: Family Medicine

## 2022-08-21 DIAGNOSIS — M9979 Connective tissue and disc stenosis of intervertebral foramina of abdomen and other regions: Secondary | ICD-10-CM

## 2022-08-21 NOTE — Progress Notes (Unsigned)
Cardiology Office Note  Date:  08/22/2022   ID:  Joseph Macdonald, DOB July 30, 1946, MRN [id number]  PCP:  [name], MD   Chief Complaint  Patient presents with   3 month follow up     Patient c/o shortness of breath, facial swelling & bilateral LE edema. Medications reviewed by the patient verbally.     HPI:  Joseph Macdonald is a 76 y.o. male with history of  sustained VT,  myasthenia gravis with myasthenia crisis requiring plasma exchange at [facility] in 12/2018, June 2023 HFpEF,  HTN,  smoking, COPD, on oxygen HLD  HCM who presents for follow up of HFpEF, atrial fibrillation on Eliquis, cardiomyopathy, acute on chronic respiratory failure  Last seen by myself in the clinic June 2023  Taking torsemide 20 to 40 daily Fluid retention over the weekend, was traveling to [place], off his diuretic  Walking with walker, feels his leg strength and balance is stable Only able to walk short distances, but does not feel he is losing ground Off oxygen , "[facility] took [facility]"  Followed by ventricular health  Compliant with his medications including low-dose digoxin, metoprolol succinate, low-dose Entresto, spironolactone, Jardiance, torsemide  Blood pressure low, denies orthostasis symptoms home  EKG personally reviewed by myself on todays visit Normal sinus rhythm rate 66 bpm poor R wave progression through the precordial leads, left axis deviation  June 2023 long hospitalization cardiac arrest secondary to respiratory failure pneumonia, cardiomyopathy, acute on chronic systolic CHF Ejection fraction estimated 35 to 40% on echo Treated with aggressive diuresis  Cardiac catheterization May 16, 2022 Moderate LAD disease, 60% lesion no intervention performed Moderate pulmonary hypertension  Profound weakness during his hospitalization concerning for myasthenia gravis flare, treated with IVIG  Was discharged to [place] medical history reviewed [facility]  12/2018 for  marked weakness consistent with myasthenia crisis. He underwent plasma exchange and was noted to have episodes of NSVT.    PMH:   has a past medical history of Anxiety, Arthritis, CHF (congestive heart failure) ([place]), COPD (chronic obstructive pulmonary disease) ([place]), Diabetes mellitus without complication ([place]), Dyspnea, Hyperlipidemia, Hypertension, Myasthenia gravis ([place]), Oxygen deficit, and Ventricular tachycardia ([place]).  PSH:    Past Surgical History:  Procedure Laterality Date   carpel tunnel sx     CATARACT EXTRACTION     right eye   CATARACT EXTRACTION W/PHACO Left 12/01/2020   Procedure: CATARACT EXTRACTION PHACO AND INTRAOCULAR LENS PLACEMENT (IOC) LEFT 10.83 01:14.1 14.6%;  Surgeon: Leandrew Koyanagi, MD;  Location: [facility];  Service: Ophthalmology;  Laterality: Left;   INTRAVASCULAR PRESSURE WIRE/FFR STUDY N/A 05/16/2022   Procedure: INTRAVASCULAR PRESSURE WIRE/FFR STUDY;  Surgeon: Nelva Bush, MD;  Location: [facility];  Service: Cardiovascular;  Laterality: N/A;   RETINAL DETACHMENT SURGERY     RIGHT/LEFT HEART CATH AND CORONARY ANGIOGRAPHY N/A 05/16/2022   Procedure: RIGHT/LEFT HEART CATH AND CORONARY ANGIOGRAPHY;  Surgeon: Nelva Bush, MD;  Location: [facility];  Service: Cardiovascular;  Laterality: N/A;   SKIN GRAFT     Behind left knee   TONSILLECTOMY AND ADENOIDECTOMY      Current Outpatient Medications  Medication Sig Dispense Refill   albuterol (PROVENTIL HFA;VENTOLIN HFA) 108 (90 Base) MCG/ACT inhaler Inhale into the lungs every 6 (six) hours as needed for wheezing or shortness of breath.     apixaban (ELIQUIS) 5 MG TABS tablet Take 5 mg by mouth 2 (two) times daily.     cholecalciferol (VITAMIN D)  25 MCG (1000 UNIT) tablet Take 2,000 Units by mouth daily.     empagliflozin (JARDIANCE) 25 MG TABS tablet Take 25 mg by mouth daily.     HYDROcodone-acetaminophen (NORCO/VICODIN) 5-325 MG tablet Take 1 tablet by mouth every 6  (six) hours as needed for moderate pain. 120 tablet 0   metoprolol succinate (TOPROL-XL) 25 MG 24 hr tablet Take 25 mg by mouth daily.     OLANZapine zydis (ZYPREXA) 5 MG disintegrating tablet Take 1 tablet (5 mg total) by mouth 2 (two) times daily. 60 tablet 3   omeprazole (PRILOSEC) 40 MG capsule Take 40 mg by mouth 2 (two) times daily.      pramipexole (MIRAPEX) 0.5 MG tablet TAKE ONE TABLET BY MOUTH AT BEDTIME 90 tablet 0   pravastatin (PRAVACHOL) 20 MG tablet Take 20 mg by mouth daily.     pyridostigmine (MESTINON) 60 MG tablet Take 1 tablet (60 mg total) by mouth 3 (three) times daily. 90 tablet 3   sacubitril-valsartan (ENTRESTO) 24-26 MG Take 1 tablet (24/26 mg) by mouth twice daily     spironolactone (ALDACTONE) 25 MG tablet Take 25 mg by mouth daily.     tiotropium (SPIRIVA) 18 MCG inhalation capsule Place 18 mcg into inhaler and inhale daily.     torsemide (DEMADEX) 20 MG tablet Take 20 mg by mouth daily.     vitamin B-12 (CYANOCOBALAMIN) 1000 MCG tablet Take 1,000 mcg by mouth daily.     budesonide-formoterol (SYMBICORT) 160-4.5 MCG/ACT inhaler Inhale 2 puffs into the lungs 2 (two) times daily. (Patient not taking: Reported on 08/07/2022) 1 Inhaler 0   diazepam (VALIUM) 5 MG tablet Take 5 mg by mouth every 6 (six) hours as needed for anxiety. (Patient not taking: Reported on 08/07/2022)     digoxin (LANOXIN) 0.125 MG tablet Take 1 tablet (0.125 mg total) by mouth daily. (Patient not taking: Reported on 08/07/2022)     terbinafine (LAMISIL) 250 MG tablet Take 250 mg by mouth daily. (Patient not taking: Reported on 08/07/2022)     No current facility-administered medications for this visit.    Allergies:   Lisinopril   Social History:  The patient  reports that he quit smoking about 5 years ago. His smoking use included pipe and cigarettes. He has a 20.00 pack-year smoking history. He quit smokeless tobacco use about 5 years ago. He reports current alcohol use. He reports that he does not  use drugs.   Family History:   family history includes Arthritis in his father, mother, and another family member; Heart attack in his father; Heart disease (age of onset: 27) in his father; Hypertension in his mother; Kidney disease in his mother; Stroke in his father; Thyroid disease in his mother.    Review of Systems: Review of Systems  Constitutional: Negative.   HENT: Negative.    Respiratory:  Positive for shortness of breath.   Cardiovascular: Negative.   Gastrointestinal: Negative.   Musculoskeletal: Negative.   Neurological: Negative.   Psychiatric/Behavioral: Negative.    All other systems reviewed and are negative.   PHYSICAL EXAM: VS:  BP 100/60 (BP Location: Left Arm, Patient Position: Sitting, Cuff Size: Normal)   Pulse 66   Ht '5\' 7"'$  (1.702 m)   Wt 227 lb 8 oz (103.2 kg)   SpO2 95%   BMI 35.63 kg/m  , BMI Body mass index is 35.63 kg/m. Constitutional:  oriented to person, place, and time. No distress.  HENT:  Head: Grossly normal Eyes:  no  discharge. No scleral icterus.  Neck: No JVD, no carotid bruits  Cardiovascular: Regular rate and rhythm, no murmurs appreciated Pulmonary/Chest: Clear to auscultation bilaterally, no wheezes or rails Abdominal: Soft.  no distension.  no tenderness.  Musculoskeletal: Normal range of motion Neurological:  normal muscle tone. Coordination normal. No atrophy Skin: Skin warm and dry Psychiatric: normal affect, pleasant  Recent Labs: 04/30/2022: TSH 0.441 05/08/2022: Magnesium 2.1 06/08/2022: ALT 17; B Natriuretic Peptide 714.7 06/13/2022: Hemoglobin 13.8; Platelets 175 06/29/2022: BUN 24; Creatinine, Ser 0.88; Potassium 4.2; Sodium 142    Lipid Panel Lab Results  Component Value Date   CHOL 141 04/03/2022   HDL 62 04/03/2022   LDLCALC 62 04/03/2022   TRIG 92 04/03/2022  Home  Wt Readings from Last 3 Encounters:  08/22/22 227 lb 8 oz (103.2 kg)  08/07/22 225 lb 2 oz (102.1 kg)  06/29/22 234 lb 8 oz (106.4 kg)      ASSESSMENT AND PLAN:  Problem List Items Addressed This Visit       Cardiology Problems   Chronic heart failure with preserved ejection fraction (HFpEF) (HCC) - Primary   Relevant Orders   EKG 12-Lead   Ventricular tachycardia (HCC)   Relevant Orders   EKG 12-Lead   Essential hypertension   Relevant Orders   EKG 12-Lead     Other   Morbid obesity (HCC) (Chronic)   Myasthenia gravis (HCC)   COPD (chronic obstructive pulmonary disease) ([place])   Other Visit Diagnoses     Type 2 diabetes mellitus without complication, without long-term current use of insulin (HCC)       Relevant Orders   EKG 12-Lead   Lymphedema       Hyperlipidemia, unspecified hyperlipidemia type       Hypertrophic cardiomyopathy (HCC)       Relevant Orders   EKG 12-Lead   Sinus bradycardia         Chronic respiratory distress Long history of smoking, underlying COPD Muscle weakness from myasthenia gravis Recent hospitalization for acute on chronic diastolic and systolic CHF in the setting of COPD exacerbation requiring intubation -Pulmonary hypertension on catheterization Tolerating torsemide 20 up to 40 daily Off oxygen, [facility] Looking cessation recommended  Shortness of breath/chronic diastolic and systolic CHF Exacerbated by morbid obesity, COPD, myasthenia gravis, cardiomyopathy Very deconditioned, No changes to his medications Repeat echocardiogram ordered to evaluate ejection fraction  Morbid obesity Weight stable, possibly trending downward, recommended low carbohydrate diet  Myasthenia gravis Previously treated with prednisone Recent treatment IVIG in the hospital  Sustained VT Followed by Dr. Caryl Comes  Paroxysmal atrial fibrillation On Eliquis Maintaining normal sinus rhythm Continue metoprolol  Leg pain Managed by Dr. Caryn Section More ambulatory, walks with a walker, leg strength decreased but stable   Total encounter time more than 30 minutes  Greater than  50% was spent in counseling and coordination of care with the patient   Signed, Esmond Plants, M.D., Ph.D. [place], [place]

## 2022-08-21 NOTE — Progress Notes (Signed)
Encounter created in error

## 2022-08-22 ENCOUNTER — Ambulatory Visit: Payer: PPO | Attending: Cardiovascular Disease | Admitting: Cardiovascular Disease

## 2022-08-22 ENCOUNTER — Encounter: Payer: Self-pay | Admitting: Cardiovascular Disease

## 2022-08-22 VITALS — BP 100/60 | HR 66 | Ht 67.0 in | Wt 227.5 lb

## 2022-08-22 DIAGNOSIS — M47814 Spondylosis without myelopathy or radiculopathy, thoracic region: Secondary | ICD-10-CM | POA: Diagnosis not present

## 2022-08-22 DIAGNOSIS — G7000 Myasthenia gravis without (acute) exacerbation: Secondary | ICD-10-CM

## 2022-08-22 DIAGNOSIS — G894 Chronic pain syndrome: Secondary | ICD-10-CM | POA: Diagnosis not present

## 2022-08-22 DIAGNOSIS — J189 Pneumonia, unspecified organism: Secondary | ICD-10-CM | POA: Diagnosis not present

## 2022-08-22 DIAGNOSIS — I472 Ventricular tachycardia, unspecified: Secondary | ICD-10-CM

## 2022-08-22 DIAGNOSIS — Z7901 Long term (current) use of anticoagulants: Secondary | ICD-10-CM | POA: Diagnosis not present

## 2022-08-22 DIAGNOSIS — G2581 Restless legs syndrome: Secondary | ICD-10-CM | POA: Diagnosis not present

## 2022-08-22 DIAGNOSIS — I255 Ischemic cardiomyopathy: Secondary | ICD-10-CM | POA: Diagnosis not present

## 2022-08-22 DIAGNOSIS — I10 Essential (primary) hypertension: Secondary | ICD-10-CM

## 2022-08-22 DIAGNOSIS — I422 Other hypertrophic cardiomyopathy: Secondary | ICD-10-CM

## 2022-08-22 DIAGNOSIS — J9601 Acute respiratory failure with hypoxia: Secondary | ICD-10-CM | POA: Diagnosis not present

## 2022-08-22 DIAGNOSIS — J449 Chronic obstructive pulmonary disease, unspecified: Secondary | ICD-10-CM

## 2022-08-22 DIAGNOSIS — E785 Hyperlipidemia, unspecified: Secondary | ICD-10-CM | POA: Diagnosis not present

## 2022-08-22 DIAGNOSIS — E119 Type 2 diabetes mellitus without complications: Secondary | ICD-10-CM | POA: Diagnosis not present

## 2022-08-22 DIAGNOSIS — M1990 Unspecified osteoarthritis, unspecified site: Secondary | ICD-10-CM | POA: Diagnosis not present

## 2022-08-22 DIAGNOSIS — I890 Lymphedema, not elsewhere classified: Secondary | ICD-10-CM

## 2022-08-22 DIAGNOSIS — I110 Hypertensive heart disease with heart failure: Secondary | ICD-10-CM | POA: Diagnosis not present

## 2022-08-22 DIAGNOSIS — I2510 Atherosclerotic heart disease of native coronary artery without angina pectoris: Secondary | ICD-10-CM | POA: Diagnosis not present

## 2022-08-22 DIAGNOSIS — J441 Chronic obstructive pulmonary disease with (acute) exacerbation: Secondary | ICD-10-CM | POA: Diagnosis not present

## 2022-08-22 DIAGNOSIS — I5032 Chronic diastolic (congestive) heart failure: Secondary | ICD-10-CM

## 2022-08-22 DIAGNOSIS — I5043 Acute on chronic combined systolic (congestive) and diastolic (congestive) heart failure: Secondary | ICD-10-CM | POA: Diagnosis not present

## 2022-08-22 DIAGNOSIS — R001 Bradycardia, unspecified: Secondary | ICD-10-CM

## 2022-08-22 DIAGNOSIS — F419 Anxiety disorder, unspecified: Secondary | ICD-10-CM | POA: Diagnosis not present

## 2022-08-22 DIAGNOSIS — Z87891 Personal history of nicotine dependence: Secondary | ICD-10-CM | POA: Diagnosis not present

## 2022-08-22 DIAGNOSIS — E782 Mixed hyperlipidemia: Secondary | ICD-10-CM | POA: Diagnosis not present

## 2022-08-22 DIAGNOSIS — K219 Gastro-esophageal reflux disease without esophagitis: Secondary | ICD-10-CM | POA: Diagnosis not present

## 2022-08-22 DIAGNOSIS — J440 Chronic obstructive pulmonary disease with acute lower respiratory infection: Secondary | ICD-10-CM | POA: Diagnosis not present

## 2022-08-22 DIAGNOSIS — Z6830 Body mass index (BMI) 30.0-30.9, adult: Secondary | ICD-10-CM | POA: Diagnosis not present

## 2022-08-22 DIAGNOSIS — Z7984 Long term (current) use of oral hypoglycemic drugs: Secondary | ICD-10-CM | POA: Diagnosis not present

## 2022-08-22 DIAGNOSIS — Z7952 Long term (current) use of systemic steroids: Secondary | ICD-10-CM | POA: Diagnosis not present

## 2022-08-22 DIAGNOSIS — I480 Paroxysmal atrial fibrillation: Secondary | ICD-10-CM | POA: Diagnosis not present

## 2022-08-22 MED ORDER — TORSEMIDE 20 MG PO TABS: 20.0000 mg | ORAL_TABLET | Freq: Every day | ORAL | 0 refills | Status: DC

## 2022-08-22 MED ORDER — HYDROCODONE-ACETAMINOPHEN 5-325 MG PO TABS: 1.0000 | ORAL_TABLET | Freq: Four times a day (QID) | ORAL | 0 refills | Status: DC | PRN

## 2022-08-22 NOTE — Patient Instructions (Addendum)
Medication Instructions:   Torsemide 20 mg daily with extra 20 mg for ankle swelling  If you need a refill on your cardiac medications before your next appointment, please call your pharmacy.   Lab work:  No new labs needed  Testing/Procedures:  Echocardiogram  Your physician has requested that you have an echocardiogram. Echocardiography is a painless test that uses sound waves to create images of your heart. It provides your doctor with information about the size and shape of your heart and how well your heart's chambers and valves are working. This procedure takes approximately one hour. There are no restrictions for this procedure. Please note; depending on visual quality an IV may need to be placed.    Follow-Up: At [facility], you and your health needs are our priority.  As part of our continuing mission to provide you with exceptional heart care, we have created designated Provider Care Teams.  These Care Teams include your primary Cardiologist (physician) and Advanced Practice Providers (APPs -  Physician Assistants and Nurse Practitioners) who all work together to provide you with the care you need, when you need it.  You will need a follow up appointment in 6 months  Providers on your designated Care Team:   Murray Hodgkins, NP Christell Faith, PA-C Cadence Kathlen Mody, [place]  COVID-19 Vaccine Information can be found at: [email or web address] For questions related to vaccine distribution or appointments, please email [email or web address] or call [phone].

## 2022-08-31 DIAGNOSIS — I255 Ischemic cardiomyopathy: Secondary | ICD-10-CM | POA: Diagnosis not present

## 2022-08-31 DIAGNOSIS — Z87891 Personal history of nicotine dependence: Secondary | ICD-10-CM | POA: Diagnosis not present

## 2022-08-31 DIAGNOSIS — I5043 Acute on chronic combined systolic (congestive) and diastolic (congestive) heart failure: Secondary | ICD-10-CM | POA: Diagnosis not present

## 2022-08-31 DIAGNOSIS — Z7901 Long term (current) use of anticoagulants: Secondary | ICD-10-CM | POA: Diagnosis not present

## 2022-08-31 DIAGNOSIS — Z6830 Body mass index (BMI) 30.0-30.9, adult: Secondary | ICD-10-CM | POA: Diagnosis not present

## 2022-08-31 DIAGNOSIS — I480 Paroxysmal atrial fibrillation: Secondary | ICD-10-CM | POA: Diagnosis not present

## 2022-08-31 DIAGNOSIS — G894 Chronic pain syndrome: Secondary | ICD-10-CM | POA: Diagnosis not present

## 2022-08-31 DIAGNOSIS — Z7984 Long term (current) use of oral hypoglycemic drugs: Secondary | ICD-10-CM | POA: Diagnosis not present

## 2022-08-31 DIAGNOSIS — Z7952 Long term (current) use of systemic steroids: Secondary | ICD-10-CM | POA: Diagnosis not present

## 2022-08-31 DIAGNOSIS — G7000 Myasthenia gravis without (acute) exacerbation: Secondary | ICD-10-CM | POA: Diagnosis not present

## 2022-08-31 DIAGNOSIS — E119 Type 2 diabetes mellitus without complications: Secondary | ICD-10-CM | POA: Diagnosis not present

## 2022-08-31 DIAGNOSIS — K219 Gastro-esophageal reflux disease without esophagitis: Secondary | ICD-10-CM | POA: Diagnosis not present

## 2022-08-31 DIAGNOSIS — I110 Hypertensive heart disease with heart failure: Secondary | ICD-10-CM | POA: Diagnosis not present

## 2022-08-31 DIAGNOSIS — M1990 Unspecified osteoarthritis, unspecified site: Secondary | ICD-10-CM | POA: Diagnosis not present

## 2022-08-31 DIAGNOSIS — M47814 Spondylosis without myelopathy or radiculopathy, thoracic region: Secondary | ICD-10-CM | POA: Diagnosis not present

## 2022-08-31 DIAGNOSIS — E782 Mixed hyperlipidemia: Secondary | ICD-10-CM | POA: Diagnosis not present

## 2022-08-31 DIAGNOSIS — I2510 Atherosclerotic heart disease of native coronary artery without angina pectoris: Secondary | ICD-10-CM | POA: Diagnosis not present

## 2022-08-31 DIAGNOSIS — J189 Pneumonia, unspecified organism: Secondary | ICD-10-CM | POA: Diagnosis not present

## 2022-08-31 DIAGNOSIS — F419 Anxiety disorder, unspecified: Secondary | ICD-10-CM | POA: Diagnosis not present

## 2022-08-31 DIAGNOSIS — G2581 Restless legs syndrome: Secondary | ICD-10-CM | POA: Diagnosis not present

## 2022-08-31 DIAGNOSIS — J440 Chronic obstructive pulmonary disease with acute lower respiratory infection: Secondary | ICD-10-CM | POA: Diagnosis not present

## 2022-08-31 DIAGNOSIS — J441 Chronic obstructive pulmonary disease with (acute) exacerbation: Secondary | ICD-10-CM | POA: Diagnosis not present

## 2022-08-31 DIAGNOSIS — J9601 Acute respiratory failure with hypoxia: Secondary | ICD-10-CM | POA: Diagnosis not present

## 2022-09-01 DIAGNOSIS — I2510 Atherosclerotic heart disease of native coronary artery without angina pectoris: Secondary | ICD-10-CM | POA: Diagnosis not present

## 2022-09-01 DIAGNOSIS — I480 Paroxysmal atrial fibrillation: Secondary | ICD-10-CM | POA: Diagnosis not present

## 2022-09-01 DIAGNOSIS — Z6830 Body mass index (BMI) 30.0-30.9, adult: Secondary | ICD-10-CM | POA: Diagnosis not present

## 2022-09-01 DIAGNOSIS — J189 Pneumonia, unspecified organism: Secondary | ICD-10-CM | POA: Diagnosis not present

## 2022-09-01 DIAGNOSIS — G7000 Myasthenia gravis without (acute) exacerbation: Secondary | ICD-10-CM | POA: Diagnosis not present

## 2022-09-01 DIAGNOSIS — Z7952 Long term (current) use of systemic steroids: Secondary | ICD-10-CM | POA: Diagnosis not present

## 2022-09-01 DIAGNOSIS — K219 Gastro-esophageal reflux disease without esophagitis: Secondary | ICD-10-CM | POA: Diagnosis not present

## 2022-09-01 DIAGNOSIS — Z87891 Personal history of nicotine dependence: Secondary | ICD-10-CM | POA: Diagnosis not present

## 2022-09-01 DIAGNOSIS — E119 Type 2 diabetes mellitus without complications: Secondary | ICD-10-CM | POA: Diagnosis not present

## 2022-09-01 DIAGNOSIS — E782 Mixed hyperlipidemia: Secondary | ICD-10-CM | POA: Diagnosis not present

## 2022-09-01 DIAGNOSIS — G894 Chronic pain syndrome: Secondary | ICD-10-CM | POA: Diagnosis not present

## 2022-09-01 DIAGNOSIS — M1990 Unspecified osteoarthritis, unspecified site: Secondary | ICD-10-CM | POA: Diagnosis not present

## 2022-09-01 DIAGNOSIS — Z7901 Long term (current) use of anticoagulants: Secondary | ICD-10-CM | POA: Diagnosis not present

## 2022-09-01 DIAGNOSIS — I255 Ischemic cardiomyopathy: Secondary | ICD-10-CM | POA: Diagnosis not present

## 2022-09-01 DIAGNOSIS — J440 Chronic obstructive pulmonary disease with acute lower respiratory infection: Secondary | ICD-10-CM | POA: Diagnosis not present

## 2022-09-01 DIAGNOSIS — I5043 Acute on chronic combined systolic (congestive) and diastolic (congestive) heart failure: Secondary | ICD-10-CM | POA: Diagnosis not present

## 2022-09-01 DIAGNOSIS — I110 Hypertensive heart disease with heart failure: Secondary | ICD-10-CM | POA: Diagnosis not present

## 2022-09-01 DIAGNOSIS — Z7984 Long term (current) use of oral hypoglycemic drugs: Secondary | ICD-10-CM | POA: Diagnosis not present

## 2022-09-01 DIAGNOSIS — J9601 Acute respiratory failure with hypoxia: Secondary | ICD-10-CM | POA: Diagnosis not present

## 2022-09-01 DIAGNOSIS — M47814 Spondylosis without myelopathy or radiculopathy, thoracic region: Secondary | ICD-10-CM | POA: Diagnosis not present

## 2022-09-01 DIAGNOSIS — G2581 Restless legs syndrome: Secondary | ICD-10-CM | POA: Diagnosis not present

## 2022-09-01 DIAGNOSIS — F419 Anxiety disorder, unspecified: Secondary | ICD-10-CM | POA: Diagnosis not present

## 2022-09-01 DIAGNOSIS — J441 Chronic obstructive pulmonary disease with (acute) exacerbation: Secondary | ICD-10-CM | POA: Diagnosis not present

## 2022-09-04 ENCOUNTER — Ambulatory Visit: Payer: Self-pay | Admitting: *Deleted

## 2022-09-04 NOTE — Telephone Encounter (Signed)
  Chief Complaint: abd bloating Symptoms: feels full Frequency: off and on a long time Pertinent Negatives: Patient denies diarrhea, vomiting but does have nausea Disposition: '[]'$ ED /'[]'$ Urgent Care (no appt availability in office) / '[x]'$ Appointment(In office/virtual)/ '[]'$  [facility]/ '[]'$ Home Care/ '[]'$ Refused Recommended Disposition /'[]'$ [facility]/ '[]'$  Follow-up with PCP Additional Notes: appt made for tomorrow, home  care reviewed.   Reason for Disposition  [1] MILD abdomen pain (e.g., does not interfere with normal activities) AND [2] pain comes and goes (cramps) AND [3] present > 48 hours  (Exception: Mild abdomen pain is a chronic symptom, recurrent or ongoing AND present > 4 weeks.)  Answer Assessment - Initial Assessment Questions 1. SYMPTOM: "What's the main symptom you're concerned about?" (e.g., abdomen bloating, swelling)     bloating 2. ONSET: "When did bloating  start?"     Off and on for a long time 3. SEVERITY: "How bad is the bloating or swelling?"    - BLOATING: Feels gassy or bloated. No visible swelling.     - MILD SWELLING: Feels gassy or bloated. Abdomen looks mildly distended or swollen.    - MODERATE - SEVERE SWELLING: Abdomen looks very distended or swollen.      Not swollen, but bloated 4. ABDOMEN PAIN:  "Is there any abdomen pain?" If Yes, ask: "How bad is the pain?"  (e.g., Scale 1-10; mild, moderate, or severe)   - NONE (0): No pain.   - MILD (1-3): Doesn't interfere with normal activities, abdomen soft and not tender to touch.    - MODERATE (4-7): Interferes with normal activities or awakens from sleep, abdomen tender to touch.    - SEVERE (8-10): Excruciating pain, doubled over, unable to do any normal activities.       Abd pain, at the top of abd the pain is 7 5. RELIEVING AND AGGRAVATING FACTORS: "What makes it better or worse?" (e.g., certain foods, lactose, medicines)     Occasionally an antacid will help a little bit, omeprazole 6. GI  HISTORY: "Do you have any history of stomach or intestine problems?" (e.g., bowel obstruction, cancer, irritable bowel)      no 7. CAUSE: "What do you think is causing the bloating?"      unknown 8. OTHER SYMPTOMS: "Do you have any other symptoms?" (e.g., belching, blood in stool, breathing difficulty, constipation, diarrhea, fever, passing gas, vomiting, weight loss, white of eyes have turned yellow)     Nausea, bloating, rolling abd feeling, no jaundiced 9. PREGNANCY: "Is there any chance you are pregnant?" "When was your last menstrual period?"     no  Protocols used: Abdomen Bloating and Swelling-A-AH

## 2022-09-04 NOTE — Progress Notes (Unsigned)
Established patient visit  I,Joseline E Rosas,acting as a scribe for [employer], MD.,have documented all relevant documentation on the behalf of [name], MD,as directed by  [name], MD while in the presence of [name], MD.   Patient: Joseph Macdonald   DOB: 05/07/1946   76 y.o. Male  MRN: [id number] Visit Date: 09/05/2022  Today's healthcare provider: [name], MD   Chief Complaint  Patient presents with   Abdominal Pain   Subjective    Patient has been on prednisone and pyridostigmine for several years  Patient reports feeling like his food regurgitates and he has to reswallow it      Abdominal Pain  He reports chronic abdominal pain/bloating. The most recent episode started about 4-6 weeks and is gradually worsening. The abdominal pain is located in the epigastrium and upper abdomen and does not radiate. It is described as aching, colicky, pressure-like, and fullness, is 6/10 in intensity, occurring every day. It is aggravated by  any type of fluid and foods .Reports stomach feels like is bubbling. He has tried antacids and omeprazole.Pepcid, Pepto  with little relief.  Associated symptoms: No anorexia  Yes belching  No bloody stool No blood in urine   No constipation Yes diarrhea/loose stool sometimes  No dysuria No fever  Yes flatus No headaches  No headaches No joint pains  Yes-Burping Yes nausea  Yes- fullness    Wt Readings from Last 3 Encounters:  09/05/22 224 lb (101.6 kg)  08/22/22 227 lb 8 oz (103.2 kg)  08/07/22 225 lb 2 oz (102.1 kg)     Medications: Outpatient Medications Prior to Visit  Medication Sig   albuterol (PROVENTIL HFA;VENTOLIN HFA) 108 (90 Base) MCG/ACT inhaler Inhale into the lungs every 6 (six) hours as needed for wheezing or shortness of breath.   apixaban (ELIQUIS) 5 MG TABS tablet Take 5 mg by mouth 2 (two) times daily.   budesonide-formoterol (SYMBICORT)  160-4.5 MCG/ACT inhaler Inhale 2 puffs into the lungs 2 (two) times daily.   cholecalciferol (VITAMIN D) 25 MCG (1000 UNIT) tablet Take 2,000 Units by mouth daily.   diazepam (VALIUM) 5 MG tablet Take 5 mg by mouth every 6 (six) hours as needed for anxiety.   digoxin (LANOXIN) 0.125 MG tablet Take 1 tablet (0.125 mg total) by mouth daily.   empagliflozin (JARDIANCE) 25 MG TABS tablet Take 25 mg by mouth daily.   HYDROcodone-acetaminophen (NORCO/VICODIN) 5-325 MG tablet Take 1 tablet by mouth every 6 (six) hours as needed for moderate pain.   metoprolol succinate (TOPROL-XL) 25 MG 24 hr tablet Take 25 mg by mouth daily.   OLANZapine zydis (ZYPREXA) 5 MG disintegrating tablet Take 1 tablet (5 mg total) by mouth 2 (two) times daily.   omeprazole (PRILOSEC) 40 MG capsule Take 40 mg by mouth 2 (two) times daily.    pramipexole (MIRAPEX) 0.5 MG tablet TAKE ONE TABLET BY MOUTH AT BEDTIME   pravastatin (PRAVACHOL) 20 MG tablet Take 20 mg by mouth daily.   pyridostigmine (MESTINON) 60 MG tablet Take 1 tablet (60 mg total) by mouth 3 (three) times daily.   sacubitril-valsartan (ENTRESTO) 24-26 MG Take 1 tablet (24/26 mg) by mouth twice daily   spironolactone (ALDACTONE) 25 MG tablet Take 25 mg by mouth daily.   terbinafine (LAMISIL) 250 MG tablet Take 250 mg by mouth daily.   tiotropium (SPIRIVA) 18 MCG inhalation capsule Place 18 mcg into inhaler and inhale daily.   torsemide (DEMADEX) 20 MG  tablet Take 1 tablet (20 mg total) by mouth daily. You may take an additional tablet as needed for abnkle swelling in addition to your normal dose.   vitamin B-12 (CYANOCOBALAMIN) 1000 MCG tablet Take 1,000 mcg by mouth daily.   No facility-administered medications prior to visit.    Review of Systems  Constitutional:  Positive for appetite change. Negative for chills and fever.  Cardiovascular:  Negative for chest pain.  Gastrointestinal:  Positive for abdominal distention, abdominal pain and nausea. Negative  for blood in stool, constipation and vomiting.       Feels like he has to re-swallow food        Objective    BP 102/68 (BP Location: Left Arm, Patient Position: Sitting, Cuff Size: Large)   Pulse 62   Temp 98.2 F (36.8 C) (Oral)   Resp 18   Ht '5\' 7"'$  (1.702 m)   Wt 224 lb (101.6 kg)   BMI 35.08 kg/m    Physical Exam Constitutional:      Appearance: He is obese. He is ill-appearing. He is not toxic-appearing.  Cardiovascular:     Rate and Rhythm: Normal rate and regular rhythm.  Pulmonary:     Effort: Pulmonary effort is normal. No respiratory distress.     Breath sounds: No wheezing.  Abdominal:     General: Bowel sounds are normal. There is distension.     Palpations: There is no mass.     Tenderness: There is abdominal tenderness in the epigastric area. There is no guarding.  Neurological:     Mental Status: He is alert.      No results found for any visits on 09/05/22.  Assessment & Plan     Problem List Items Addressed This Visit       Other   Epigastric pain - Primary    Chronic, progressively worsening Patient has had fairly satiety, abdominal bloating, belching, sensation of food regurgitation Patient and his wife report extensive evaluation at the [facility] for this problem with previously normal studies Patient has been evaluated by Dr. Allen Norris with diagnosis of rumination and recommendation for diaphragmatic breathing exercises Patient currently on PPI and uses H2 blocker intermittently for severe discomfort As patient is on twice daily 40 mg omeprazole and using intermittent famotidine with previous trial of gabapentin that they state was not helpful for his symptoms, I will attempt a repeat referral to gastroenterology for consideration of further evaluation Patient is also due for repeat colonoscopy as his last was in 2013 and recommended for 5-year repeat Can also consider that he has some abnormal esophageal innervation or he could be experiencing side effects  of prolonged prednisone in the setting of myasthenia gravis treatment Patient will follow-up with PCP in 2 weeks  see AVS for FODMAP diet recommendations      Relevant Orders   Ambulatory referral to Gastroenterology   Lipase   Comprehensive metabolic panel   CBC w/Diff/Platelet   H. pylori breath test     Return in about 2 weeks (around 09/19/2022) for bloating .     I, [name], MD, have reviewed all documentation for this visit. The documentation on 09/05/22 for the exam, diagnosis, procedures, and orders are all accurate and complete.    [name], MD  [facility] [phone] (phone) [phone] (fax)  [place]

## 2022-09-05 ENCOUNTER — Encounter: Payer: Self-pay | Admitting: Family Medicine

## 2022-09-05 ENCOUNTER — Ambulatory Visit (INDEPENDENT_AMBULATORY_CARE_PROVIDER_SITE_OTHER): Payer: PPO | Admitting: Family Medicine

## 2022-09-05 VITALS — BP 102/68 | HR 62 | Temp 98.2°F | Resp 18 | Ht 67.0 in | Wt 224.0 lb

## 2022-09-05 DIAGNOSIS — R1013 Epigastric pain: Secondary | ICD-10-CM

## 2022-09-05 DIAGNOSIS — Z23 Encounter for immunization: Secondary | ICD-10-CM | POA: Diagnosis not present

## 2022-09-05 NOTE — Assessment & Plan Note (Signed)
Chronic, progressively worsening Patient has had fairly satiety, abdominal bloating, belching, sensation of food regurgitation Patient and his wife report extensive evaluation at the [facility] for this problem with previously normal studies Patient has been evaluated by Dr. Allen Norris with diagnosis of rumination and recommendation for diaphragmatic breathing exercises Patient currently on PPI and uses H2 blocker intermittently for severe discomfort As patient is on twice daily 40 mg omeprazole and using intermittent famotidine with previous trial of gabapentin that they state was not helpful for his symptoms, I will attempt a repeat referral to gastroenterology for consideration of further evaluation Patient is also due for repeat colonoscopy as his last was in 2013 and recommended for 5-year repeat Can also consider that he has some abnormal esophageal innervation or he could be experiencing side effects of prolonged prednisone in the setting of myasthenia gravis treatment Patient will follow-up with PCP in 2 weeks  see AVS for FODMAP diet recommendations

## 2022-09-05 NOTE — Addendum Note (Signed)
Addended by: Doristine Devoid on: 09/05/2022 03:44 PM   Modules accepted: Orders

## 2022-09-05 NOTE — Patient Instructions (Signed)
Low-FODMAP Eating Plan  FODMAP stands for fermentable oligosaccharides, disaccharides, monosaccharides, and polyols. These are sugars that are hard for some people to digest. A low-FODMAP eating plan may help some people who have irritable bowel syndrome (IBS) and certain other bowel (intestinal) diseases to manage their symptoms. This meal plan can be complicated to follow. Work with a diet and nutrition specialist (dietitian) to make a low-FODMAP eating plan that is right for you. A dietitian can help make sure that you get enough nutrition from this diet. What are tips for following this plan? Reading food labels Check labels for hidden FODMAPs such as: High-fructose syrup. Honey. Agave. Natural fruit flavors. Onion or garlic powder. Choose low-FODMAP foods that contain 3-4 grams of fiber per serving. Check food labels for serving sizes. Eat only one serving at a time to make sure FODMAP levels stay low. Shopping Shop with a list of foods that are recommended on this diet and make a meal plan. Meal planning Follow a low-FODMAP eating plan for up to 6 weeks, or as told by your health care provider or dietitian. To follow the eating plan: Eliminate high-FODMAP foods from your diet completely. Choose only low-FODMAP foods to eat. You will do this for 2-6 weeks. Gradually reintroduce high-FODMAP foods into your diet one at a time. Most people should wait a few days before introducing the next new high-FODMAP food into their meal plan. Your dietitian can recommend how quickly you may reintroduce foods. Keep a daily record of what and how much you eat and drink. Make note of any symptoms that you have after eating. Review your daily record with a dietitian regularly to identify which foods you can eat and which foods you should avoid. General tips Drink enough fluid each day to keep your urine pale yellow. Avoid processed foods. These often have added sugar and may be high in FODMAPs. Avoid  most dairy products, whole grains, and sweeteners. Work with a dietitian to make sure you get enough fiber in your diet. Avoid high FODMAP foods at meals to manage symptoms. Recommended foods Fruits Bananas, oranges, tangerines, lemons, limes, blueberries, raspberries, strawberries, grapes, cantaloupe, honeydew melon, kiwi, papaya, passion fruit, and pineapple. Limited amounts of dried cranberries, banana chips, and shredded coconut. Vegetables Eggplant, zucchini, cucumber, peppers, green beans, bean sprouts, lettuce, arugula, kale, Swiss chard, spinach, collard greens, bok choy, summer squash, potato, and tomato. Limited amounts of corn, carrot, and sweet potato. Green parts of scallions. Grains Gluten-free grains, such as rice, oats, buckwheat, quinoa, corn, polenta, and millet. Gluten-free pasta, bread, or cereal. Rice noodles. Corn tortillas. Meats and other proteins Unseasoned beef, pork, poultry, or fish. Eggs. Bacon. Tofu (firm) and tempeh. Limited amounts of nuts and seeds, such as almonds, walnuts, brazil nuts, pecans, peanuts, nut butters, pumpkin seeds, chia seeds, and sunflower seeds. Dairy Lactose-free milk, yogurt, and kefir. Lactose-free cottage cheese and ice cream. Non-dairy milks, such as almond, coconut, hemp, and rice milk. Non-dairy yogurt. Limited amounts of goat cheese, brie, mozzarella, parmesan, swiss, and other hard cheeses. Fats and oils Butter-free spreads. Vegetable oils, such as olive, canola, and sunflower oil. Seasoning and other foods Artificial sweeteners with names that do not end in "ol," such as aspartame, saccharine, and stevia. Maple syrup, white table sugar, raw sugar, brown sugar, and molasses. Mayonnaise, soy sauce, and tamari. Fresh basil, coriander, parsley, rosemary, and thyme. Beverages Water and mineral water. Sugar-sweetened soft drinks. Small amounts of orange juice or cranberry juice. Black and green tea. Most dry wines.   Coffee. The items listed  above may not be a complete list of foods and beverages you can eat. Contact a dietitian for more information. Foods to avoid Fruits Fresh, dried, and juiced forms of apple, pear, watermelon, peach, plum, cherries, apricots, blackberries, boysenberries, figs, nectarines, and mango. Avocado. Vegetables Chicory root, artichoke, asparagus, cabbage, snow peas, Brussels sprouts, broccoli, sugar snap peas, mushrooms, celery, and cauliflower. Onions, garlic, leeks, and the white part of scallions. Grains Wheat, including kamut, durum, and semolina. Barley and bulgur. Couscous. Wheat-based cereals. Wheat noodles, bread, crackers, and pastries. Meats and other proteins Fried or fatty meat. Sausage. Cashews and pistachios. Soybeans, baked beans, black beans, chickpeas, kidney beans, fava beans, navy beans, lentils, black-eyed peas, and split peas. Dairy Milk, yogurt, ice cream, and soft cheese. Cream and sour cream. Milk-based sauces. Custard. Buttermilk. Soy milk. Seasoning and other foods Any sugar-free gum or candy. Foods that contain artificial sweeteners such as sorbitol, mannitol, isomalt, or xylitol. Foods that contain honey, high-fructose corn syrup, or agave. Bouillon, vegetable stock, beef stock, and chicken stock. Garlic and onion powder. Condiments made with onion, such as hummus, chutney, pickles, relish, salad dressing, and salsa. Tomato paste. Beverages Chicory-based drinks. Coffee substitutes. Chamomile tea. Fennel tea. Sweet or fortified wines such as port or sherry. Diet soft drinks made with isomalt, mannitol, maltitol, sorbitol, or xylitol. Apple, pear, and mango juice. Juices with high-fructose corn syrup. The items listed above may not be a complete list of foods and beverages you should avoid. Contact a dietitian for more information. Summary FODMAP stands for fermentable oligosaccharides, disaccharides, monosaccharides, and polyols. These are sugars that are hard for some people to  digest. A low-FODMAP eating plan is a short-term diet that helps to ease symptoms of certain bowel diseases. The eating plan usually lasts up to 6 weeks. After that, high-FODMAP foods are reintroduced gradually and one at a time. This can help you find out which foods may be causing symptoms. A low-FODMAP eating plan can be complicated. It is best to work with a dietitian who has experience with this type of plan. This information is not intended to replace advice given to you by your health care provider. Make sure you discuss any questions you have with your health care provider. Document Revised: 04/15/2020 Document Reviewed: 04/15/2020 Elsevier Patient Education  2023 [employer]  

## 2022-09-06 ENCOUNTER — Ambulatory Visit: Payer: PPO

## 2022-09-06 DIAGNOSIS — I480 Paroxysmal atrial fibrillation: Secondary | ICD-10-CM | POA: Diagnosis not present

## 2022-09-06 DIAGNOSIS — G7000 Myasthenia gravis without (acute) exacerbation: Secondary | ICD-10-CM | POA: Diagnosis not present

## 2022-09-06 DIAGNOSIS — I110 Hypertensive heart disease with heart failure: Secondary | ICD-10-CM | POA: Diagnosis not present

## 2022-09-06 DIAGNOSIS — M1990 Unspecified osteoarthritis, unspecified site: Secondary | ICD-10-CM | POA: Diagnosis not present

## 2022-09-06 DIAGNOSIS — Z7901 Long term (current) use of anticoagulants: Secondary | ICD-10-CM | POA: Diagnosis not present

## 2022-09-06 DIAGNOSIS — I5043 Acute on chronic combined systolic (congestive) and diastolic (congestive) heart failure: Secondary | ICD-10-CM | POA: Diagnosis not present

## 2022-09-06 DIAGNOSIS — G2581 Restless legs syndrome: Secondary | ICD-10-CM | POA: Diagnosis not present

## 2022-09-06 DIAGNOSIS — E782 Mixed hyperlipidemia: Secondary | ICD-10-CM | POA: Diagnosis not present

## 2022-09-06 DIAGNOSIS — Z87891 Personal history of nicotine dependence: Secondary | ICD-10-CM | POA: Diagnosis not present

## 2022-09-06 DIAGNOSIS — K219 Gastro-esophageal reflux disease without esophagitis: Secondary | ICD-10-CM | POA: Diagnosis not present

## 2022-09-06 DIAGNOSIS — Z7952 Long term (current) use of systemic steroids: Secondary | ICD-10-CM | POA: Diagnosis not present

## 2022-09-06 DIAGNOSIS — I255 Ischemic cardiomyopathy: Secondary | ICD-10-CM | POA: Diagnosis not present

## 2022-09-06 DIAGNOSIS — J441 Chronic obstructive pulmonary disease with (acute) exacerbation: Secondary | ICD-10-CM | POA: Diagnosis not present

## 2022-09-06 DIAGNOSIS — F419 Anxiety disorder, unspecified: Secondary | ICD-10-CM | POA: Diagnosis not present

## 2022-09-06 DIAGNOSIS — G894 Chronic pain syndrome: Secondary | ICD-10-CM | POA: Diagnosis not present

## 2022-09-06 DIAGNOSIS — Z7984 Long term (current) use of oral hypoglycemic drugs: Secondary | ICD-10-CM | POA: Diagnosis not present

## 2022-09-06 DIAGNOSIS — J189 Pneumonia, unspecified organism: Secondary | ICD-10-CM | POA: Diagnosis not present

## 2022-09-06 DIAGNOSIS — Z6830 Body mass index (BMI) 30.0-30.9, adult: Secondary | ICD-10-CM | POA: Diagnosis not present

## 2022-09-06 DIAGNOSIS — M47814 Spondylosis without myelopathy or radiculopathy, thoracic region: Secondary | ICD-10-CM | POA: Diagnosis not present

## 2022-09-06 DIAGNOSIS — J440 Chronic obstructive pulmonary disease with acute lower respiratory infection: Secondary | ICD-10-CM | POA: Diagnosis not present

## 2022-09-06 DIAGNOSIS — J9601 Acute respiratory failure with hypoxia: Secondary | ICD-10-CM | POA: Diagnosis not present

## 2022-09-06 DIAGNOSIS — I2510 Atherosclerotic heart disease of native coronary artery without angina pectoris: Secondary | ICD-10-CM | POA: Diagnosis not present

## 2022-09-06 DIAGNOSIS — E119 Type 2 diabetes mellitus without complications: Secondary | ICD-10-CM | POA: Diagnosis not present

## 2022-09-07 LAB — COMPREHENSIVE METABOLIC PANEL
ALT: 16 IU/L (ref 0–44)
AST: 18 IU/L (ref 0–40)
Albumin/Globulin Ratio: 1.7 (ref 1.2–2.2)
Albumin: 4.2 g/dL (ref 3.8–4.8)
Alkaline Phosphatase: 55 IU/L (ref 44–121)
BUN/Creatinine Ratio: 16 (ref 10–24)
BUN: 20 mg/dL (ref 8–27)
Bilirubin Total: <0.2 mg/dL (ref 0.0–1.2)
CO2: 28 mmol/L (ref 20–29)
Calcium: 8.9 mg/dL (ref 8.6–10.2)
Chloride: 92 mmol/L — ABNORMAL LOW (ref 96–106)
Creatinine, Ser: 1.28 mg/dL — ABNORMAL HIGH (ref 0.76–1.27)
Globulin, Total: 2.5 g/dL (ref 1.5–4.5)
Glucose: 166 mg/dL — ABNORMAL HIGH (ref 70–99)
Potassium: 5.2 mmol/L (ref 3.5–5.2)
Sodium: 138 mmol/L (ref 134–144)
Total Protein: 6.7 g/dL (ref 6.0–8.5)
eGFR: 58 mL/min/{1.73_m2} — ABNORMAL LOW (ref >59)

## 2022-09-07 LAB — CBC WITH DIFFERENTIAL/PLATELET
Basophils Absolute: 0 10*3/uL (ref 0.0–0.2)
Basos: 0 %
EOS (ABSOLUTE): 0 10*3/uL (ref 0.0–0.4)
Eos: 0 %
Hematocrit: 53 % — ABNORMAL HIGH (ref 37.5–51.0)
Hemoglobin: 17.1 g/dL (ref 13.0–17.7)
Immature Grans (Abs): 0.1 10*3/uL (ref 0.0–0.1)
Immature Granulocytes: 1 %
Lymphocytes Absolute: 1.7 10*3/uL (ref 0.7–3.1)
Lymphs: 14 %
MCH: 29.3 pg (ref 26.6–33.0)
MCHC: 32.3 g/dL (ref 31.5–35.7)
MCV: 91 fL (ref 79–97)
Monocytes Absolute: 0.7 10*3/uL (ref 0.1–0.9)
Monocytes: 6 %
Neutrophils Absolute: 9.8 10*3/uL — ABNORMAL HIGH (ref 1.4–7.0)
Neutrophils: 79 %
Platelets: 201 10*3/uL (ref 150–450)
RBC: 5.83 x10E6/uL — ABNORMAL HIGH (ref 4.14–5.80)
RDW: 14 % (ref 11.6–15.4)
WBC: 12.3 10*3/uL — ABNORMAL HIGH (ref 3.4–10.8)

## 2022-09-07 LAB — LIPASE: Lipase: 22 U/L (ref 13–78)

## 2022-09-07 LAB — H. PYLORI BREATH TEST: H pylori Breath Test: NEGATIVE

## 2022-09-11 DIAGNOSIS — K219 Gastro-esophageal reflux disease without esophagitis: Secondary | ICD-10-CM | POA: Diagnosis not present

## 2022-09-11 DIAGNOSIS — E782 Mixed hyperlipidemia: Secondary | ICD-10-CM | POA: Diagnosis not present

## 2022-09-11 DIAGNOSIS — J440 Chronic obstructive pulmonary disease with acute lower respiratory infection: Secondary | ICD-10-CM | POA: Diagnosis not present

## 2022-09-11 DIAGNOSIS — J441 Chronic obstructive pulmonary disease with (acute) exacerbation: Secondary | ICD-10-CM | POA: Diagnosis not present

## 2022-09-11 DIAGNOSIS — Z87891 Personal history of nicotine dependence: Secondary | ICD-10-CM | POA: Diagnosis not present

## 2022-09-11 DIAGNOSIS — M47814 Spondylosis without myelopathy or radiculopathy, thoracic region: Secondary | ICD-10-CM | POA: Diagnosis not present

## 2022-09-11 DIAGNOSIS — I255 Ischemic cardiomyopathy: Secondary | ICD-10-CM | POA: Diagnosis not present

## 2022-09-11 DIAGNOSIS — Z7901 Long term (current) use of anticoagulants: Secondary | ICD-10-CM | POA: Diagnosis not present

## 2022-09-11 DIAGNOSIS — G7000 Myasthenia gravis without (acute) exacerbation: Secondary | ICD-10-CM | POA: Diagnosis not present

## 2022-09-11 DIAGNOSIS — I480 Paroxysmal atrial fibrillation: Secondary | ICD-10-CM | POA: Diagnosis not present

## 2022-09-11 DIAGNOSIS — J189 Pneumonia, unspecified organism: Secondary | ICD-10-CM | POA: Diagnosis not present

## 2022-09-11 DIAGNOSIS — Z6830 Body mass index (BMI) 30.0-30.9, adult: Secondary | ICD-10-CM | POA: Diagnosis not present

## 2022-09-11 DIAGNOSIS — F419 Anxiety disorder, unspecified: Secondary | ICD-10-CM | POA: Diagnosis not present

## 2022-09-11 DIAGNOSIS — Z7952 Long term (current) use of systemic steroids: Secondary | ICD-10-CM | POA: Diagnosis not present

## 2022-09-11 DIAGNOSIS — Z7984 Long term (current) use of oral hypoglycemic drugs: Secondary | ICD-10-CM | POA: Diagnosis not present

## 2022-09-11 DIAGNOSIS — I5043 Acute on chronic combined systolic (congestive) and diastolic (congestive) heart failure: Secondary | ICD-10-CM | POA: Diagnosis not present

## 2022-09-11 DIAGNOSIS — I2510 Atherosclerotic heart disease of native coronary artery without angina pectoris: Secondary | ICD-10-CM | POA: Diagnosis not present

## 2022-09-11 DIAGNOSIS — E119 Type 2 diabetes mellitus without complications: Secondary | ICD-10-CM | POA: Diagnosis not present

## 2022-09-11 DIAGNOSIS — I110 Hypertensive heart disease with heart failure: Secondary | ICD-10-CM | POA: Diagnosis not present

## 2022-09-11 DIAGNOSIS — J9601 Acute respiratory failure with hypoxia: Secondary | ICD-10-CM | POA: Diagnosis not present

## 2022-09-11 DIAGNOSIS — M1990 Unspecified osteoarthritis, unspecified site: Secondary | ICD-10-CM | POA: Diagnosis not present

## 2022-09-11 DIAGNOSIS — G894 Chronic pain syndrome: Secondary | ICD-10-CM | POA: Diagnosis not present

## 2022-09-11 DIAGNOSIS — G2581 Restless legs syndrome: Secondary | ICD-10-CM | POA: Diagnosis not present

## 2022-09-12 ENCOUNTER — Ambulatory Visit: Payer: PPO | Attending: Cardiovascular Disease

## 2022-09-12 DIAGNOSIS — I5032 Chronic diastolic (congestive) heart failure: Secondary | ICD-10-CM

## 2022-09-12 DIAGNOSIS — G4733 Obstructive sleep apnea (adult) (pediatric): Secondary | ICD-10-CM | POA: Diagnosis not present

## 2022-09-12 DIAGNOSIS — D692 Other nonthrombocytopenic purpura: Secondary | ICD-10-CM | POA: Diagnosis not present

## 2022-09-12 DIAGNOSIS — J449 Chronic obstructive pulmonary disease, unspecified: Secondary | ICD-10-CM

## 2022-09-12 DIAGNOSIS — Z515 Encounter for palliative care: Secondary | ICD-10-CM | POA: Diagnosis not present

## 2022-09-12 DIAGNOSIS — I5030 Unspecified diastolic (congestive) heart failure: Secondary | ICD-10-CM | POA: Diagnosis not present

## 2022-09-12 DIAGNOSIS — I110 Hypertensive heart disease with heart failure: Secondary | ICD-10-CM | POA: Diagnosis not present

## 2022-09-12 DIAGNOSIS — R110 Nausea: Secondary | ICD-10-CM | POA: Diagnosis not present

## 2022-09-12 DIAGNOSIS — Z87891 Personal history of nicotine dependence: Secondary | ICD-10-CM | POA: Diagnosis not present

## 2022-09-12 LAB — ECHOCARDIOGRAM COMPLETE
AR max vel: 2.19 cm2
AV Area VTI: 1.95 cm2
AV Area mean vel: 1.86 cm2
AV Mean grad: 5 mmHg
AV Peak grad: 6.7 mmHg
Ao pk vel: 1.29 m/s
Area-P 1/2: 3.13 cm2
S' Lateral: 2.8 cm

## 2022-09-13 ENCOUNTER — Telehealth: Payer: Self-pay | Admitting: Cardiovascular Disease

## 2022-09-13 DIAGNOSIS — Z6830 Body mass index (BMI) 30.0-30.9, adult: Secondary | ICD-10-CM | POA: Diagnosis not present

## 2022-09-13 DIAGNOSIS — E782 Mixed hyperlipidemia: Secondary | ICD-10-CM | POA: Diagnosis not present

## 2022-09-13 DIAGNOSIS — J9601 Acute respiratory failure with hypoxia: Secondary | ICD-10-CM | POA: Diagnosis not present

## 2022-09-13 DIAGNOSIS — F419 Anxiety disorder, unspecified: Secondary | ICD-10-CM | POA: Diagnosis not present

## 2022-09-13 DIAGNOSIS — K219 Gastro-esophageal reflux disease without esophagitis: Secondary | ICD-10-CM | POA: Diagnosis not present

## 2022-09-13 DIAGNOSIS — I110 Hypertensive heart disease with heart failure: Secondary | ICD-10-CM | POA: Diagnosis not present

## 2022-09-13 DIAGNOSIS — E119 Type 2 diabetes mellitus without complications: Secondary | ICD-10-CM | POA: Diagnosis not present

## 2022-09-13 DIAGNOSIS — I255 Ischemic cardiomyopathy: Secondary | ICD-10-CM | POA: Diagnosis not present

## 2022-09-13 DIAGNOSIS — G2581 Restless legs syndrome: Secondary | ICD-10-CM | POA: Diagnosis not present

## 2022-09-13 DIAGNOSIS — J441 Chronic obstructive pulmonary disease with (acute) exacerbation: Secondary | ICD-10-CM | POA: Diagnosis not present

## 2022-09-13 DIAGNOSIS — I480 Paroxysmal atrial fibrillation: Secondary | ICD-10-CM | POA: Diagnosis not present

## 2022-09-13 DIAGNOSIS — G7000 Myasthenia gravis without (acute) exacerbation: Secondary | ICD-10-CM | POA: Diagnosis not present

## 2022-09-13 DIAGNOSIS — J189 Pneumonia, unspecified organism: Secondary | ICD-10-CM | POA: Diagnosis not present

## 2022-09-13 DIAGNOSIS — M47814 Spondylosis without myelopathy or radiculopathy, thoracic region: Secondary | ICD-10-CM | POA: Diagnosis not present

## 2022-09-13 DIAGNOSIS — Z7984 Long term (current) use of oral hypoglycemic drugs: Secondary | ICD-10-CM | POA: Diagnosis not present

## 2022-09-13 DIAGNOSIS — I2510 Atherosclerotic heart disease of native coronary artery without angina pectoris: Secondary | ICD-10-CM | POA: Diagnosis not present

## 2022-09-13 DIAGNOSIS — G894 Chronic pain syndrome: Secondary | ICD-10-CM | POA: Diagnosis not present

## 2022-09-13 DIAGNOSIS — I5043 Acute on chronic combined systolic (congestive) and diastolic (congestive) heart failure: Secondary | ICD-10-CM | POA: Diagnosis not present

## 2022-09-13 DIAGNOSIS — J440 Chronic obstructive pulmonary disease with acute lower respiratory infection: Secondary | ICD-10-CM | POA: Diagnosis not present

## 2022-09-13 DIAGNOSIS — M1990 Unspecified osteoarthritis, unspecified site: Secondary | ICD-10-CM | POA: Diagnosis not present

## 2022-09-13 DIAGNOSIS — Z7901 Long term (current) use of anticoagulants: Secondary | ICD-10-CM | POA: Diagnosis not present

## 2022-09-13 DIAGNOSIS — Z7952 Long term (current) use of systemic steroids: Secondary | ICD-10-CM | POA: Diagnosis not present

## 2022-09-13 DIAGNOSIS — Z87891 Personal history of nicotine dependence: Secondary | ICD-10-CM | POA: Diagnosis not present

## 2022-09-13 NOTE — Telephone Encounter (Signed)
I faxed echo report to [phone]

## 2022-09-13 NOTE — Telephone Encounter (Signed)
Doris from [employer] is calling requesting a fax for Echo results be faxed to them as soon as possible for his appt with them today.  Fax # [phone]

## 2022-09-19 ENCOUNTER — Telehealth: Payer: Self-pay

## 2022-09-19 ENCOUNTER — Other Ambulatory Visit: Payer: Self-pay | Admitting: Family Medicine

## 2022-09-19 ENCOUNTER — Encounter: Payer: Self-pay | Admitting: Family Medicine

## 2022-09-19 ENCOUNTER — Ambulatory Visit (INDEPENDENT_AMBULATORY_CARE_PROVIDER_SITE_OTHER): Payer: PPO | Admitting: Family Medicine

## 2022-09-19 VITALS — BP 88/56 | HR 77 | Temp 98.2°F | Resp 16 | Ht 67.0 in | Wt 224.9 lb

## 2022-09-19 DIAGNOSIS — M9979 Connective tissue and disc stenosis of intervertebral foramina of abdomen and other regions: Secondary | ICD-10-CM

## 2022-09-19 DIAGNOSIS — R1011 Right upper quadrant pain: Secondary | ICD-10-CM

## 2022-09-19 DIAGNOSIS — R6881 Early satiety: Secondary | ICD-10-CM | POA: Diagnosis not present

## 2022-09-19 MED ORDER — HYDROCODONE-ACETAMINOPHEN 5-325 MG PO TABS: 1.0000 | ORAL_TABLET | Freq: Four times a day (QID) | ORAL | 0 refills | Status: DC | PRN

## 2022-09-19 NOTE — Progress Notes (Signed)
I,Joseph Macdonald,acting as a scribe for [employer], MD.,have documented all relevant documentation on the behalf of Joseph Foster, MD,as directed by  Joseph Foster, MD while in the presence of Joseph Foster, MD.   Established patient visit   Patient: Joseph Macdonald   DOB: 02-Oct-1946   76 y.o. Male  MRN: [id number] Visit Date: 09/19/2022  Today's healthcare provider: [name], MD   Chief Complaint  Patient presents with   Follow-up   Subjective    HPI  Follow up for bloating  The patient was last seen for this 2 weeks ago. Changes made at last visit include referral to GI placed. Per patient's wife his bloating is not any better. Would like his gallbladder checked due to new right upper quadrant pain  Patient reports multiple soft, sometimes loose stools per day  Denies hematochezia or melena  Patient is still full after a few bites of food Only reports dysphagia with certain meats  He denies vomiting but still feels nauseated   -----------------------------------------------------------------------------------------   Medications: Outpatient Medications Prior to Visit  Medication Sig   albuterol (PROVENTIL HFA;VENTOLIN HFA) 108 (90 Base) MCG/ACT inhaler Inhale into the lungs every 6 (six) hours as needed for wheezing or shortness of breath.   apixaban (ELIQUIS) 5 MG TABS tablet Take 5 mg by mouth 2 (two) times daily.   cholecalciferol (VITAMIN D) 25 MCG (1000 UNIT) tablet Take 2,000 Units by mouth daily.   digoxin (LANOXIN) 0.125 MG tablet Take 1 tablet (0.125 mg total) by mouth daily.   empagliflozin (JARDIANCE) 25 MG TABS tablet Take 25 mg by mouth daily.   HYDROcodone-acetaminophen (NORCO/VICODIN) 5-325 MG tablet Take 1 tablet by mouth every 6 (six) hours as needed for moderate pain.   metoprolol succinate (TOPROL-XL) 25 MG 24 hr tablet Take 25 mg by mouth daily.   OLANZapine zydis (ZYPREXA) 5 MG  disintegrating tablet Take 1 tablet (5 mg total) by mouth 2 (two) times daily.   omeprazole (PRILOSEC) 40 MG capsule Take 40 mg by mouth 2 (two) times daily.    pravastatin (PRAVACHOL) 20 MG tablet Take 20 mg by mouth daily.   pyridostigmine (MESTINON) 60 MG tablet Take 1 tablet (60 mg total) by mouth 3 (three) times daily.   sacubitril-valsartan (ENTRESTO) 24-26 MG Take 1 tablet (24/26 mg) by mouth twice daily   spironolactone (ALDACTONE) 25 MG tablet Take 25 mg by mouth daily.   tiotropium (SPIRIVA) 18 MCG inhalation capsule Place 18 mcg into inhaler and inhale daily.   torsemide (DEMADEX) 20 MG tablet Take 1 tablet (20 mg total) by mouth daily. You may take an additional tablet as needed for abnkle swelling in addition to your normal dose.   vitamin B-12 (CYANOCOBALAMIN) 1000 MCG tablet Take 1,000 mcg by mouth daily.   budesonide-formoterol (SYMBICORT) 160-4.5 MCG/ACT inhaler Inhale 2 puffs into the lungs 2 (two) times daily. (Patient not taking: Reported on 09/19/2022)   diazepam (VALIUM) 5 MG tablet Take 5 mg by mouth every 6 (six) hours as needed for anxiety. (Patient not taking: Reported on 09/19/2022)   pramipexole (MIRAPEX) 0.5 MG tablet TAKE ONE TABLET BY MOUTH AT BEDTIME (Patient not taking: Reported on 09/19/2022)   terbinafine (LAMISIL) 250 MG tablet Take 250 mg by mouth daily. (Patient not taking: Reported on 09/19/2022)   No facility-administered medications prior to visit.    Review of Systems     Objective    BP (!) 88/56 (BP Location: Left Arm, Patient Position: Sitting,  Cuff Size: Large)   Pulse 77   Temp 98.2 F (36.8 C) (Oral)   Resp 16   Ht $R'5\' 7"'Sq$  (1.702 m)   Wt 224 lb 14.4 oz (102 kg)   BMI 35.22 kg/m    Physical Exam Vitals (hypotensive) reviewed.  Cardiovascular:     Rate and Rhythm: Normal rate and regular rhythm.     Heart sounds: Normal heart sounds. No murmur heard. Pulmonary:     Effort: Pulmonary effort is normal. No respiratory distress.   Abdominal:     General: Bowel sounds are normal. There is no distension.     Tenderness: There is abdominal tenderness in the right upper quadrant and epigastric area.  Musculoskeletal:     Right lower leg: No edema.     Left lower leg: No edema.     Comments: LE venous stasis changes        No results found for any visits on 09/19/22.  Assessment & Plan     Problem List Items Addressed This Visit       Other   RUQ abdominal pain - Primary    New specific pain to RUQ with palpation  In setting of early satiety, will order stat RUQ  Alk phos from 09/05/22 was within normal limits        Relevant Orders   US Abdomen Limited RUQ (LIVER/GB)   Early satiety    Continued bloating, early satiety Submitted referral to gastroenterology, patient has not been able to return call from scheduling as of yet  Discussed potential for obstruction vs diabetes gastroparesis  Gave patient's wife the callback number for gastro with recommendation to schedule ASAP as I think patient would benefit from further specialist evaluation and potential advanced imaging, including recommended colonoscopy  Will send patient for RUQ [place] due to new pain  Will send patient for KUB to evaluate for any signs of obstruction, if this is negative, given recent A1c of 6.9 offered trial of metoclopramide to see if this could be gastroparesis-related Patient had negative lipase in evaluation of epigastric pain with concern for pancreatitis         Relevant Orders   DG Abd 1 View    No follow-ups on file.      I, Joseph Foster, MD, have reviewed all documentation for this visit. The documentation on 09/19/22 for the exam, diagnosis, procedures, and orders are all accurate and complete.    Joseph Foster, MD  [facility] [phone] (phone) [phone] (fax)  [place]

## 2022-09-19 NOTE — Assessment & Plan Note (Signed)
New specific pain to RUQ with palpation  In setting of early satiety, will order stat RUQ  Alk phos from 09/05/22 was within normal limits

## 2022-09-19 NOTE — Assessment & Plan Note (Addendum)
Continued bloating, early satiety Submitted referral to gastroenterology, patient has not been able to return call from scheduling as of yet  Discussed potential for obstruction vs diabetes gastroparesis  Gave patient's wife the callback number for gastro with recommendation to schedule ASAP as I think patient would benefit from further specialist evaluation and potential advanced imaging, including recommended colonoscopy  Will send patient for RUQ [place] due to new pain  Will send patient for KUB to evaluate for any signs of obstruction, if this is negative, given recent A1c of 6.9 offered trial of metoclopramide to see if this could be gastroparesis-related Patient had negative lipase in evaluation of epigastric pain with concern for pancreatitis

## 2022-09-19 NOTE — Telephone Encounter (Signed)
Asked patient and patient's wife to wait while his [place] appointment was being scheduled. Patient waited like 7 minutes and then they left because they couldn't wait any longer.  Patient is scheduled to have [place] at [facility] tomorrow 09/20/2022 at 8 am. Need to be there at 7:30-7:45 am NPO 6 hours. Called and LM on patient's cell number.

## 2022-09-20 ENCOUNTER — Ambulatory Visit: Payer: PPO | Attending: Family Medicine

## 2022-09-20 DIAGNOSIS — Z6830 Body mass index (BMI) 30.0-30.9, adult: Secondary | ICD-10-CM | POA: Diagnosis not present

## 2022-09-20 DIAGNOSIS — J441 Chronic obstructive pulmonary disease with (acute) exacerbation: Secondary | ICD-10-CM | POA: Diagnosis not present

## 2022-09-20 DIAGNOSIS — K219 Gastro-esophageal reflux disease without esophagitis: Secondary | ICD-10-CM | POA: Diagnosis not present

## 2022-09-20 DIAGNOSIS — G7000 Myasthenia gravis without (acute) exacerbation: Secondary | ICD-10-CM | POA: Diagnosis not present

## 2022-09-20 DIAGNOSIS — Z7984 Long term (current) use of oral hypoglycemic drugs: Secondary | ICD-10-CM | POA: Diagnosis not present

## 2022-09-20 DIAGNOSIS — F419 Anxiety disorder, unspecified: Secondary | ICD-10-CM | POA: Diagnosis not present

## 2022-09-20 DIAGNOSIS — I255 Ischemic cardiomyopathy: Secondary | ICD-10-CM | POA: Diagnosis not present

## 2022-09-20 DIAGNOSIS — I2510 Atherosclerotic heart disease of native coronary artery without angina pectoris: Secondary | ICD-10-CM | POA: Diagnosis not present

## 2022-09-20 DIAGNOSIS — J440 Chronic obstructive pulmonary disease with acute lower respiratory infection: Secondary | ICD-10-CM | POA: Diagnosis not present

## 2022-09-20 DIAGNOSIS — M47814 Spondylosis without myelopathy or radiculopathy, thoracic region: Secondary | ICD-10-CM | POA: Diagnosis not present

## 2022-09-20 DIAGNOSIS — Z7901 Long term (current) use of anticoagulants: Secondary | ICD-10-CM | POA: Diagnosis not present

## 2022-09-20 DIAGNOSIS — E119 Type 2 diabetes mellitus without complications: Secondary | ICD-10-CM | POA: Diagnosis not present

## 2022-09-20 DIAGNOSIS — I480 Paroxysmal atrial fibrillation: Secondary | ICD-10-CM | POA: Diagnosis not present

## 2022-09-20 DIAGNOSIS — Z87891 Personal history of nicotine dependence: Secondary | ICD-10-CM | POA: Diagnosis not present

## 2022-09-20 DIAGNOSIS — I5043 Acute on chronic combined systolic (congestive) and diastolic (congestive) heart failure: Secondary | ICD-10-CM | POA: Diagnosis not present

## 2022-09-20 DIAGNOSIS — I110 Hypertensive heart disease with heart failure: Secondary | ICD-10-CM | POA: Diagnosis not present

## 2022-09-20 DIAGNOSIS — M1990 Unspecified osteoarthritis, unspecified site: Secondary | ICD-10-CM | POA: Diagnosis not present

## 2022-09-20 DIAGNOSIS — J9601 Acute respiratory failure with hypoxia: Secondary | ICD-10-CM | POA: Diagnosis not present

## 2022-09-20 DIAGNOSIS — G894 Chronic pain syndrome: Secondary | ICD-10-CM | POA: Diagnosis not present

## 2022-09-20 DIAGNOSIS — J189 Pneumonia, unspecified organism: Secondary | ICD-10-CM | POA: Diagnosis not present

## 2022-09-20 DIAGNOSIS — Z7952 Long term (current) use of systemic steroids: Secondary | ICD-10-CM | POA: Diagnosis not present

## 2022-09-20 DIAGNOSIS — E782 Mixed hyperlipidemia: Secondary | ICD-10-CM | POA: Diagnosis not present

## 2022-09-20 DIAGNOSIS — G2581 Restless legs syndrome: Secondary | ICD-10-CM | POA: Diagnosis not present

## 2022-09-20 NOTE — Telephone Encounter (Signed)
Patient did not get the message about his [place] appointment this morning. Patient would like to have appointment rescheduled. Please follow up with patient.

## 2022-09-20 NOTE — Telephone Encounter (Signed)
Number provider to patient's wife to call the [place] department and schedule. [phone]

## 2022-09-22 ENCOUNTER — Other Ambulatory Visit: Payer: Self-pay | Admitting: Family Medicine

## 2022-09-22 DIAGNOSIS — R1011 Right upper quadrant pain: Secondary | ICD-10-CM

## 2022-09-25 ENCOUNTER — Ambulatory Visit
Admission: RE | Admit: 2022-09-25 | Discharge: 2022-09-25 | Disposition: A | Discharge status: Home / Self Care | Payer: PPO | Source: Ambulatory Visit | Attending: Family Medicine | Admitting: Family Medicine

## 2022-09-25 ENCOUNTER — Ambulatory Visit
Admission: RE | Admit: 2022-09-25 | Discharge: 2022-09-25 | Disposition: A | Discharge status: Home / Self Care | Payer: PPO | Attending: Family Medicine | Admitting: Family Medicine

## 2022-09-25 DIAGNOSIS — R110 Nausea: Secondary | ICD-10-CM | POA: Diagnosis not present

## 2022-09-25 DIAGNOSIS — R6881 Early satiety: Secondary | ICD-10-CM

## 2022-09-26 ENCOUNTER — Ambulatory Visit
Admission: RE | Admit: 2022-09-26 | Discharge: 2022-09-26 | Disposition: A | Discharge status: Home / Self Care | Payer: PPO | Source: Ambulatory Visit | Attending: Family Medicine | Admitting: Family Medicine

## 2022-09-26 DIAGNOSIS — R1011 Right upper quadrant pain: Secondary | ICD-10-CM | POA: Insufficient documentation

## 2022-09-27 ENCOUNTER — Other Ambulatory Visit: Payer: Self-pay | Admitting: Family Medicine

## 2022-09-27 DIAGNOSIS — F419 Anxiety disorder, unspecified: Secondary | ICD-10-CM | POA: Diagnosis not present

## 2022-09-27 DIAGNOSIS — Z7952 Long term (current) use of systemic steroids: Secondary | ICD-10-CM | POA: Diagnosis not present

## 2022-09-27 DIAGNOSIS — Z6830 Body mass index (BMI) 30.0-30.9, adult: Secondary | ICD-10-CM | POA: Diagnosis not present

## 2022-09-27 DIAGNOSIS — I2510 Atherosclerotic heart disease of native coronary artery without angina pectoris: Secondary | ICD-10-CM | POA: Diagnosis not present

## 2022-09-27 DIAGNOSIS — I255 Ischemic cardiomyopathy: Secondary | ICD-10-CM | POA: Diagnosis not present

## 2022-09-27 DIAGNOSIS — K219 Gastro-esophageal reflux disease without esophagitis: Secondary | ICD-10-CM | POA: Diagnosis not present

## 2022-09-27 DIAGNOSIS — E119 Type 2 diabetes mellitus without complications: Secondary | ICD-10-CM | POA: Diagnosis not present

## 2022-09-27 DIAGNOSIS — J440 Chronic obstructive pulmonary disease with acute lower respiratory infection: Secondary | ICD-10-CM | POA: Diagnosis not present

## 2022-09-27 DIAGNOSIS — Z7984 Long term (current) use of oral hypoglycemic drugs: Secondary | ICD-10-CM | POA: Diagnosis not present

## 2022-09-27 DIAGNOSIS — M1990 Unspecified osteoarthritis, unspecified site: Secondary | ICD-10-CM | POA: Diagnosis not present

## 2022-09-27 DIAGNOSIS — Z87891 Personal history of nicotine dependence: Secondary | ICD-10-CM | POA: Diagnosis not present

## 2022-09-27 DIAGNOSIS — G2581 Restless legs syndrome: Secondary | ICD-10-CM | POA: Diagnosis not present

## 2022-09-27 DIAGNOSIS — I110 Hypertensive heart disease with heart failure: Secondary | ICD-10-CM | POA: Diagnosis not present

## 2022-09-27 DIAGNOSIS — J9601 Acute respiratory failure with hypoxia: Secondary | ICD-10-CM | POA: Diagnosis not present

## 2022-09-27 DIAGNOSIS — J441 Chronic obstructive pulmonary disease with (acute) exacerbation: Secondary | ICD-10-CM | POA: Diagnosis not present

## 2022-09-27 DIAGNOSIS — I5043 Acute on chronic combined systolic (congestive) and diastolic (congestive) heart failure: Secondary | ICD-10-CM | POA: Diagnosis not present

## 2022-09-27 DIAGNOSIS — E782 Mixed hyperlipidemia: Secondary | ICD-10-CM | POA: Diagnosis not present

## 2022-09-27 DIAGNOSIS — Z7901 Long term (current) use of anticoagulants: Secondary | ICD-10-CM | POA: Diagnosis not present

## 2022-09-27 DIAGNOSIS — G7000 Myasthenia gravis without (acute) exacerbation: Secondary | ICD-10-CM | POA: Diagnosis not present

## 2022-09-27 DIAGNOSIS — J189 Pneumonia, unspecified organism: Secondary | ICD-10-CM | POA: Diagnosis not present

## 2022-09-27 DIAGNOSIS — G894 Chronic pain syndrome: Secondary | ICD-10-CM | POA: Diagnosis not present

## 2022-09-27 DIAGNOSIS — I480 Paroxysmal atrial fibrillation: Secondary | ICD-10-CM | POA: Diagnosis not present

## 2022-09-27 DIAGNOSIS — M47814 Spondylosis without myelopathy or radiculopathy, thoracic region: Secondary | ICD-10-CM | POA: Diagnosis not present

## 2022-09-27 MED ORDER — METOCLOPRAMIDE HCL 5 MG PO TABS: 5.0000 mg | ORAL_TABLET | Freq: Three times a day (TID) | ORAL | 0 refills | Status: DC

## 2022-09-27 NOTE — Progress Notes (Signed)
RX for metoclopramide to take with meals and at bedtime for dyspepsia.    Recommend follow up with gastroenterology referral asap    Follow up with PCP in next 4 weeks    [name], MD  [facility]  [phone]

## 2022-09-27 NOTE — Progress Notes (Signed)
RX for metoclopramide to take with meals and at bedtime for dyspepsia.    Recommend follow up with gastroenterology referral asap    Follow up with PCP in next 4 weeks    [name], MD  [facility]  [phone]

## 2022-10-04 DIAGNOSIS — Z7952 Long term (current) use of systemic steroids: Secondary | ICD-10-CM | POA: Diagnosis not present

## 2022-10-04 DIAGNOSIS — G7000 Myasthenia gravis without (acute) exacerbation: Secondary | ICD-10-CM | POA: Diagnosis not present

## 2022-10-04 DIAGNOSIS — I480 Paroxysmal atrial fibrillation: Secondary | ICD-10-CM | POA: Diagnosis not present

## 2022-10-04 DIAGNOSIS — E119 Type 2 diabetes mellitus without complications: Secondary | ICD-10-CM | POA: Diagnosis not present

## 2022-10-04 DIAGNOSIS — K219 Gastro-esophageal reflux disease without esophagitis: Secondary | ICD-10-CM | POA: Diagnosis not present

## 2022-10-04 DIAGNOSIS — M1990 Unspecified osteoarthritis, unspecified site: Secondary | ICD-10-CM | POA: Diagnosis not present

## 2022-10-04 DIAGNOSIS — Z7901 Long term (current) use of anticoagulants: Secondary | ICD-10-CM | POA: Diagnosis not present

## 2022-10-04 DIAGNOSIS — G894 Chronic pain syndrome: Secondary | ICD-10-CM | POA: Diagnosis not present

## 2022-10-04 DIAGNOSIS — M47814 Spondylosis without myelopathy or radiculopathy, thoracic region: Secondary | ICD-10-CM | POA: Diagnosis not present

## 2022-10-04 DIAGNOSIS — F419 Anxiety disorder, unspecified: Secondary | ICD-10-CM | POA: Diagnosis not present

## 2022-10-04 DIAGNOSIS — E782 Mixed hyperlipidemia: Secondary | ICD-10-CM | POA: Diagnosis not present

## 2022-10-04 DIAGNOSIS — Z87891 Personal history of nicotine dependence: Secondary | ICD-10-CM | POA: Diagnosis not present

## 2022-10-04 DIAGNOSIS — J9601 Acute respiratory failure with hypoxia: Secondary | ICD-10-CM | POA: Diagnosis not present

## 2022-10-04 DIAGNOSIS — J189 Pneumonia, unspecified organism: Secondary | ICD-10-CM | POA: Diagnosis not present

## 2022-10-04 DIAGNOSIS — J440 Chronic obstructive pulmonary disease with acute lower respiratory infection: Secondary | ICD-10-CM | POA: Diagnosis not present

## 2022-10-04 DIAGNOSIS — Z7984 Long term (current) use of oral hypoglycemic drugs: Secondary | ICD-10-CM | POA: Diagnosis not present

## 2022-10-04 DIAGNOSIS — J441 Chronic obstructive pulmonary disease with (acute) exacerbation: Secondary | ICD-10-CM | POA: Diagnosis not present

## 2022-10-04 DIAGNOSIS — Z6830 Body mass index (BMI) 30.0-30.9, adult: Secondary | ICD-10-CM | POA: Diagnosis not present

## 2022-10-04 DIAGNOSIS — G2581 Restless legs syndrome: Secondary | ICD-10-CM | POA: Diagnosis not present

## 2022-10-04 DIAGNOSIS — I110 Hypertensive heart disease with heart failure: Secondary | ICD-10-CM | POA: Diagnosis not present

## 2022-10-04 DIAGNOSIS — I2510 Atherosclerotic heart disease of native coronary artery without angina pectoris: Secondary | ICD-10-CM | POA: Diagnosis not present

## 2022-10-04 DIAGNOSIS — I5043 Acute on chronic combined systolic (congestive) and diastolic (congestive) heart failure: Secondary | ICD-10-CM | POA: Diagnosis not present

## 2022-10-04 DIAGNOSIS — I255 Ischemic cardiomyopathy: Secondary | ICD-10-CM | POA: Diagnosis not present

## 2022-10-11 DIAGNOSIS — G7000 Myasthenia gravis without (acute) exacerbation: Secondary | ICD-10-CM | POA: Diagnosis not present

## 2022-10-11 DIAGNOSIS — Z7901 Long term (current) use of anticoagulants: Secondary | ICD-10-CM | POA: Diagnosis not present

## 2022-10-11 DIAGNOSIS — Z87891 Personal history of nicotine dependence: Secondary | ICD-10-CM | POA: Diagnosis not present

## 2022-10-11 DIAGNOSIS — I480 Paroxysmal atrial fibrillation: Secondary | ICD-10-CM | POA: Diagnosis not present

## 2022-10-11 DIAGNOSIS — J189 Pneumonia, unspecified organism: Secondary | ICD-10-CM | POA: Diagnosis not present

## 2022-10-11 DIAGNOSIS — J440 Chronic obstructive pulmonary disease with acute lower respiratory infection: Secondary | ICD-10-CM | POA: Diagnosis not present

## 2022-10-11 DIAGNOSIS — I255 Ischemic cardiomyopathy: Secondary | ICD-10-CM | POA: Diagnosis not present

## 2022-10-11 DIAGNOSIS — J441 Chronic obstructive pulmonary disease with (acute) exacerbation: Secondary | ICD-10-CM | POA: Diagnosis not present

## 2022-10-11 DIAGNOSIS — M1990 Unspecified osteoarthritis, unspecified site: Secondary | ICD-10-CM | POA: Diagnosis not present

## 2022-10-11 DIAGNOSIS — Z6830 Body mass index (BMI) 30.0-30.9, adult: Secondary | ICD-10-CM | POA: Diagnosis not present

## 2022-10-11 DIAGNOSIS — Z7952 Long term (current) use of systemic steroids: Secondary | ICD-10-CM | POA: Diagnosis not present

## 2022-10-11 DIAGNOSIS — E119 Type 2 diabetes mellitus without complications: Secondary | ICD-10-CM | POA: Diagnosis not present

## 2022-10-11 DIAGNOSIS — J9601 Acute respiratory failure with hypoxia: Secondary | ICD-10-CM | POA: Diagnosis not present

## 2022-10-11 DIAGNOSIS — M47814 Spondylosis without myelopathy or radiculopathy, thoracic region: Secondary | ICD-10-CM | POA: Diagnosis not present

## 2022-10-11 DIAGNOSIS — E782 Mixed hyperlipidemia: Secondary | ICD-10-CM | POA: Diagnosis not present

## 2022-10-11 DIAGNOSIS — I5043 Acute on chronic combined systolic (congestive) and diastolic (congestive) heart failure: Secondary | ICD-10-CM | POA: Diagnosis not present

## 2022-10-11 DIAGNOSIS — G894 Chronic pain syndrome: Secondary | ICD-10-CM | POA: Diagnosis not present

## 2022-10-11 DIAGNOSIS — Z7984 Long term (current) use of oral hypoglycemic drugs: Secondary | ICD-10-CM | POA: Diagnosis not present

## 2022-10-11 DIAGNOSIS — I2510 Atherosclerotic heart disease of native coronary artery without angina pectoris: Secondary | ICD-10-CM | POA: Diagnosis not present

## 2022-10-11 DIAGNOSIS — G2581 Restless legs syndrome: Secondary | ICD-10-CM | POA: Diagnosis not present

## 2022-10-11 DIAGNOSIS — K219 Gastro-esophageal reflux disease without esophagitis: Secondary | ICD-10-CM | POA: Diagnosis not present

## 2022-10-11 DIAGNOSIS — I110 Hypertensive heart disease with heart failure: Secondary | ICD-10-CM | POA: Diagnosis not present

## 2022-10-11 DIAGNOSIS — F419 Anxiety disorder, unspecified: Secondary | ICD-10-CM | POA: Diagnosis not present

## 2022-10-13 DIAGNOSIS — Z7952 Long term (current) use of systemic steroids: Secondary | ICD-10-CM | POA: Diagnosis not present

## 2022-10-13 DIAGNOSIS — J189 Pneumonia, unspecified organism: Secondary | ICD-10-CM | POA: Diagnosis not present

## 2022-10-13 DIAGNOSIS — G2581 Restless legs syndrome: Secondary | ICD-10-CM | POA: Diagnosis not present

## 2022-10-13 DIAGNOSIS — Z87891 Personal history of nicotine dependence: Secondary | ICD-10-CM | POA: Diagnosis not present

## 2022-10-13 DIAGNOSIS — I255 Ischemic cardiomyopathy: Secondary | ICD-10-CM | POA: Diagnosis not present

## 2022-10-13 DIAGNOSIS — Z6830 Body mass index (BMI) 30.0-30.9, adult: Secondary | ICD-10-CM | POA: Diagnosis not present

## 2022-10-13 DIAGNOSIS — M1990 Unspecified osteoarthritis, unspecified site: Secondary | ICD-10-CM | POA: Diagnosis not present

## 2022-10-13 DIAGNOSIS — M47814 Spondylosis without myelopathy or radiculopathy, thoracic region: Secondary | ICD-10-CM | POA: Diagnosis not present

## 2022-10-13 DIAGNOSIS — G894 Chronic pain syndrome: Secondary | ICD-10-CM | POA: Diagnosis not present

## 2022-10-13 DIAGNOSIS — E782 Mixed hyperlipidemia: Secondary | ICD-10-CM | POA: Diagnosis not present

## 2022-10-13 DIAGNOSIS — I480 Paroxysmal atrial fibrillation: Secondary | ICD-10-CM | POA: Diagnosis not present

## 2022-10-13 DIAGNOSIS — I2510 Atherosclerotic heart disease of native coronary artery without angina pectoris: Secondary | ICD-10-CM | POA: Diagnosis not present

## 2022-10-13 DIAGNOSIS — J9601 Acute respiratory failure with hypoxia: Secondary | ICD-10-CM | POA: Diagnosis not present

## 2022-10-13 DIAGNOSIS — I5043 Acute on chronic combined systolic (congestive) and diastolic (congestive) heart failure: Secondary | ICD-10-CM | POA: Diagnosis not present

## 2022-10-13 DIAGNOSIS — Z7901 Long term (current) use of anticoagulants: Secondary | ICD-10-CM | POA: Diagnosis not present

## 2022-10-13 DIAGNOSIS — J440 Chronic obstructive pulmonary disease with acute lower respiratory infection: Secondary | ICD-10-CM | POA: Diagnosis not present

## 2022-10-13 DIAGNOSIS — F419 Anxiety disorder, unspecified: Secondary | ICD-10-CM | POA: Diagnosis not present

## 2022-10-13 DIAGNOSIS — I110 Hypertensive heart disease with heart failure: Secondary | ICD-10-CM | POA: Diagnosis not present

## 2022-10-13 DIAGNOSIS — Z7984 Long term (current) use of oral hypoglycemic drugs: Secondary | ICD-10-CM | POA: Diagnosis not present

## 2022-10-13 DIAGNOSIS — G7000 Myasthenia gravis without (acute) exacerbation: Secondary | ICD-10-CM | POA: Diagnosis not present

## 2022-10-13 DIAGNOSIS — E119 Type 2 diabetes mellitus without complications: Secondary | ICD-10-CM | POA: Diagnosis not present

## 2022-10-13 DIAGNOSIS — J441 Chronic obstructive pulmonary disease with (acute) exacerbation: Secondary | ICD-10-CM | POA: Diagnosis not present

## 2022-10-13 DIAGNOSIS — K219 Gastro-esophageal reflux disease without esophagitis: Secondary | ICD-10-CM | POA: Diagnosis not present

## 2022-10-15 ENCOUNTER — Other Ambulatory Visit: Payer: Self-pay | Admitting: Family Medicine

## 2022-10-16 ENCOUNTER — Other Ambulatory Visit: Payer: Self-pay | Admitting: Family Medicine

## 2022-10-16 DIAGNOSIS — F419 Anxiety disorder, unspecified: Secondary | ICD-10-CM

## 2022-10-19 ENCOUNTER — Other Ambulatory Visit: Payer: Self-pay | Admitting: Family Medicine

## 2022-10-19 DIAGNOSIS — M9979 Connective tissue and disc stenosis of intervertebral foramina of abdomen and other regions: Secondary | ICD-10-CM

## 2022-10-20 MED ORDER — HYDROCODONE-ACETAMINOPHEN 5-325 MG PO TABS: 1.0000 | ORAL_TABLET | Freq: Four times a day (QID) | ORAL | 0 refills | Status: DC | PRN

## 2022-11-13 DIAGNOSIS — Z515 Encounter for palliative care: Secondary | ICD-10-CM | POA: Diagnosis not present

## 2022-11-13 DIAGNOSIS — Z6834 Body mass index (BMI) 34.0-34.9, adult: Secondary | ICD-10-CM | POA: Diagnosis not present

## 2022-11-13 DIAGNOSIS — Z9981 Dependence on supplemental oxygen: Secondary | ICD-10-CM | POA: Diagnosis not present

## 2022-11-13 DIAGNOSIS — Z87891 Personal history of nicotine dependence: Secondary | ICD-10-CM | POA: Diagnosis not present

## 2022-11-13 DIAGNOSIS — I509 Heart failure, unspecified: Secondary | ICD-10-CM | POA: Diagnosis not present

## 2022-11-13 DIAGNOSIS — J449 Chronic obstructive pulmonary disease, unspecified: Secondary | ICD-10-CM | POA: Diagnosis not present

## 2022-11-14 ENCOUNTER — Ambulatory Visit (INDEPENDENT_AMBULATORY_CARE_PROVIDER_SITE_OTHER): Payer: PPO | Admitting: Gastroenterology

## 2022-11-14 ENCOUNTER — Encounter: Payer: Self-pay | Admitting: Gastroenterology

## 2022-11-14 ENCOUNTER — Telehealth: Payer: Self-pay | Admitting: Cardiovascular Disease

## 2022-11-14 ENCOUNTER — Telehealth: Payer: Self-pay

## 2022-11-14 VITALS — BP 104/67 | HR 48 | Temp 97.6°F | Ht 67.0 in | Wt 222.0 lb

## 2022-11-14 DIAGNOSIS — R109 Unspecified abdominal pain: Secondary | ICD-10-CM | POA: Diagnosis not present

## 2022-11-14 DIAGNOSIS — Z86010 Personal history of colonic polyps: Secondary | ICD-10-CM | POA: Diagnosis not present

## 2022-11-14 DIAGNOSIS — R1310 Dysphagia, unspecified: Secondary | ICD-10-CM | POA: Diagnosis not present

## 2022-11-14 MED ORDER — NA SULFATE-K SULFATE-MG SULF 17.5-3.13-1.6 GM/177ML PO SOLN: 1.0000 | Freq: Once | ORAL | 0 refills | Status: AC

## 2022-11-14 NOTE — Progress Notes (Addendum)
Primary Care Physician: [name], MD  Primary Gastroenterologist:  Dr. Lucilla Lame  Chief Complaint  Patient presents with   Abdominal Pain    HPI: Joseph Macdonald is a 76 y.o. male here who is seen me in the past for rumination.  The patient was giving exercises to try and improve his rumination.  The patient now was seen by his primary care provider and sent to me for an urgent referral due to dysphagia and early satiety.  The patient had an EGD and gastric emptying study with barium swallow at the [place] which was mentioned in my last note.  The patient was now started on Reglan for dyspepsia by the PCP.  Due to right upper quadrant pain the patient was sent for an ultrasound that showed:  IMPRESSION: 1. Echogenic liver with coarsened echotexture. Findings are nonspecific and may reflect hepatic steatosis and/or other hepatocellular disease. Correlate with liver function tests. 2. Normal sonographic appearance of the gallbladder.  The patient's most recent liver enzymes were normal. The patient comes in today with a note from his neurologist requesting a multitude of studies to be done including an upper endoscopy and colonoscopy, gastric emptying study versus manometry.  For my understanding some of these tests have already been done as referenced in my last note that these were done at the [place] which I have no access to the records.  The patient's wife reports that she can prevent up the records but she has not done so yet. The patient's wife states that the patient has been having more symptoms the last few months after he had pneumonia.  He is in the office today physically uncomfortable with what he reports pain in his mid sternum and upper abdomen with continued clearing of his throat in an almost tic-like fashion.  He also has intermittent burping. The wife states that the patient is having dysphagia to meat more than anything else. The patient's wife clearly states that this  patient does not have any symptoms when he lays down to go to sleep or while he is sleeping.  She does report that he has a hard time going to sleep.  Past Medical History:  Diagnosis Date   Anxiety    Arthritis    CHF (congestive heart failure) (HCC)    COPD (chronic obstructive pulmonary disease) (HCC)    Diabetes mellitus without complication (HCC)    diet controlled   Dyspnea    with activity   Hyperlipidemia    Hypertension    Myasthenia gravis ([place])    Oxygen deficit    Ventricular tachycardia (HCC)     Current Outpatient Medications  Medication Sig Dispense Refill   albuterol (PROVENTIL HFA;VENTOLIN HFA) 108 (90 Base) MCG/ACT inhaler Inhale into the lungs every 6 (six) hours as needed for wheezing or shortness of breath.     apixaban (ELIQUIS) 5 MG TABS tablet Take 5 mg by mouth 2 (two) times daily.     budesonide-formoterol (SYMBICORT) 160-4.5 MCG/ACT inhaler Inhale 2 puffs into the lungs 2 (two) times daily. 1 Inhaler 0   cholecalciferol (VITAMIN D) 25 MCG (1000 UNIT) tablet Take 2,000 Units by mouth daily.     diazepam (VALIUM) 5 MG tablet Take 5 mg by mouth every 6 (six) hours as needed for anxiety.     digoxin (LANOXIN) 0.125 MG tablet Take 1 tablet (0.125 mg total) by mouth daily.     empagliflozin (JARDIANCE) 25 MG TABS tablet Take 25 mg by  mouth daily.     HYDROcodone-acetaminophen (NORCO/VICODIN) 5-325 MG tablet Take 1 tablet by mouth every 6 (six) hours as needed for moderate pain. 120 tablet 0   metoprolol succinate (TOPROL-XL) 25 MG 24 hr tablet Take 25 mg by mouth daily.     OLANZapine zydis (ZYPREXA) 5 MG disintegrating tablet ALLOW ONE TABLET TO DISSOLVE IN THE MOUTH TWICE DAILY 60 tablet 3   omeprazole (PRILOSEC) 40 MG capsule Take 40 mg by mouth 2 (two) times daily.      pramipexole (MIRAPEX) 0.5 MG tablet TAKE 1 TABLET BY MOUTH AT BEDTIME 90 tablet 3   pravastatin (PRAVACHOL) 20 MG tablet Take 20 mg by mouth daily.     pyridostigmine (MESTINON) 60 MG tablet  Take 1 tablet (60 mg total) by mouth 3 (three) times daily. 90 tablet 3   sacubitril-valsartan (ENTRESTO) 24-26 MG Take 1 tablet (24/26 mg) by mouth twice daily     spironolactone (ALDACTONE) 25 MG tablet Take 25 mg by mouth daily.     terbinafine (LAMISIL) 250 MG tablet Take 250 mg by mouth daily.     tiotropium (SPIRIVA) 18 MCG inhalation capsule Place 18 mcg into inhaler and inhale daily.     torsemide (DEMADEX) 20 MG tablet Take 1 tablet (20 mg total) by mouth daily. You may take an additional tablet as needed for abnkle swelling in addition to your normal dose. 180 tablet 0   vitamin B-12 (CYANOCOBALAMIN) 1000 MCG tablet Take 1,000 mcg by mouth daily.     Na Sulfate-K Sulfate-Mg Sulf 17.5-3.13-1.6 GM/177ML SOLN Take 1 kit by mouth once for 1 dose. 354 mL 0   No current facility-administered medications for this visit.    Allergies as of 11/14/2022 - Review Complete 11/14/2022  Allergen Reaction Noted   Lisinopril Swelling 04/22/2012    ROS:  General: Negative for anorexia, weight loss, fever, chills, fatigue, weakness. ENT: Negative for hoarseness, difficulty swallowing , nasal congestion. CV: Negative for chest pain, angina, palpitations, dyspnea on exertion, peripheral edema.  Respiratory: Negative for dyspnea at rest, dyspnea on exertion, cough, sputum, wheezing.  GI: See history of present illness. GU:  Negative for dysuria, hematuria, urinary incontinence, urinary frequency, nocturnal urination.  Endo: Negative for unusual weight change.    Physical Examination:   BP 104/67 (BP Location: Right Arm, Patient Position: Sitting, Cuff Size: Normal)   Pulse (!) 48   Temp 97.6 F (36.4 C) (Oral)   Ht _0  (1.702 m)   Wt 222 lb (100.7 kg)   BMI 34.77 kg/m   General: Well-nourished, well-developed in no acute distress.  Eyes: No icterus. Conjunctivae pink. Lungs: Clear to auscultation bilaterally. Non-labored. Heart: Regular rate and rhythm, no murmurs rubs or gallops.   Abdomen: Bowel sounds are normal, Tenderness in the epigastric area to deep palpation, nondistended, no hepatosplenomegaly or masses, no abdominal bruits or hernia , no rebound or guarding.   Extremities: No lower extremity edema. No clubbing or deformities. Neuro: Alert and oriented x 3.  Grossly intact. Skin: Warm and dry, no jaundice.   Psych: Alert and cooperative, normal mood and affect.  Labs:    Imaging Studies: No results found.  Assessment and Plan:   Joseph Macdonald is a 76 y.o. y/o male who comes in today with a history of dysphagia to meats. 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  The patient has abdominal pain in the epigastric area and substernal chest pain.  The patient's symptoms are only while  he is awake and subside when he is resting or sleeping.  This is consistent with functional bowel disorder and not a structural abnormality.  The patient's dysphagia Is likely structural and the patient will be set up for an upper endoscopy.  The patient also has a history of being told he needs a colonoscopy and will also have a colonoscopy done at the same time.  As far as the recommendations for a gastric emptying study and esophageal manometry, these symptoms are not consistent with esophageal motility problems or gastroparesis in the patient states that he has had a barium swallow and a gastric emptying study at the [place] although I do not have access to those records. The patient's wife states she will present those records and have them forwarded to me.  The patient will follow up with the time of the EGD and colonoscopy. Of note the patient and his wife states that he did not start the Reglan that was reported to be recommended by his primary care provider's office and I have endorsed them not starting on this medication due to significant side effects and not a treatment for dyspepsia.     Lucilla Lame, MD. [facility]    Note: This  dictation was prepared with Dragon dictation along with smaller phrase technology. Any transcriptional errors that result from this process are unintentional.

## 2022-11-14 NOTE — Telephone Encounter (Signed)
Clearance faxed to Dr Rockey Situ for Eliquis

## 2022-11-14 NOTE — Telephone Encounter (Signed)
   Pre-operative Risk Assessment    Patient Name: Joseph Macdonald  DOB: [birth year] MRN: [id number]{       Request for Surgical Clearance    Procedure:   COLONOSCOPY AND EGD  Date of Surgery:  Clearance 11/30/22                               Surgeon:  NOT INDICATED Surgeon's Group or Practice Name:  [facility] Phone number:  [phone] Fax number:  [phone]  Type of Clearance Requested:   - Medical    Type of Anesthesia:  General    Additional requests/questions:    Signed, Eli Phillips   11/14/2022, 3:22 PM

## 2022-11-14 NOTE — Telephone Encounter (Signed)
It looks like this clearance was intended to hold Eliquis so will route to pharm.  Given baseline poor functional status outlined in 08/2022 OV with Dr. Rockey Situ, would need MD input since not able to clear by traditional METS. Dr. Rockey Situ, GI is requesting colonoscopy and EGD under general anesthesia in the near future, 11/30/22. Your last OV 08/2022 outlines sustained VT, myasthenia gravis, PAF, chronic combined CHF, HTN, tobacco, chronic respiratory failure, HLD, HCM, balance problems , prior cardiac arrest and moderate CAD by cath 05/2022 with moderate pulmonary HTN. Please let us know your thoughts on any preoperative testing or if patient may proceed at higher baseline risk - Please route response to P CV DIV PREOP (the pre-op pool). Thank you.

## 2022-11-15 NOTE — Telephone Encounter (Signed)
Minna Merritts, MD  to Cv Div Preop     11/15/22 12:07 PM Acceptable risk for EGD and colonoscopy with anesthesia No further cardiac testing needed Would hold Eliquis 2 days prior to procedure Restart once cleared by GI performing procedure Thx TGollan

## 2022-11-15 NOTE — Telephone Encounter (Signed)
   Patient Name: Joseph Macdonald  DOB: [birth year] MRN: [id number]  Primary Cardiologist: [name], MD  Chart reviewed as part of pre-operative protocol coverage. Given past medical history and time since last visit, based on ACC/AHA guidelines,and per Dr. Rockey Situ, primary cardiologist, Joseph Macdonald is at acceptable risk for the planned procedure without further cardiovascular testing.   Per office protocol, patient can hold Eliquis for 1-2 days prior to procedure.  Please resume Eliquis as soon as possible postprocedure, at the discretion of the surgeon.   I will route this recommendation to the requesting party via Epic fax function and remove from pre-op pool.  Please call with questions.  Lenna Sciara, NP 11/15/2022, 1:14 PM

## 2022-11-15 NOTE — Telephone Encounter (Signed)
Patient with diagnosis of afib on Eliquis for anticoagulation.    Procedure: colonoscopy and EGD Date of procedure: 11/30/22  CHA2DS2-VASc Score = 6  This indicates a 9.7% annual risk of stroke. The patient's score is based upon: CHF History: 1 HTN History: 1 Diabetes History: 1 Stroke History: 0 Vascular Disease History: 1 Age Score: 2 Gender Score: 0  CrCl 75m/min using adjusted body weight Platelet count 201K  Per office protocol, patient can hold Eliquis for 1-2 days prior to procedure.    **This guidance is not considered finalized until pre-operative APP has relayed final recommendations.**

## 2022-11-15 NOTE — Telephone Encounter (Signed)
Called pt, no answer... lmovm letting pt know to hold Eliquis starting 11/28/22 2 days prior to procedure

## 2022-11-20 ENCOUNTER — Other Ambulatory Visit: Payer: Self-pay

## 2022-11-20 ENCOUNTER — Other Ambulatory Visit: Payer: Self-pay | Admitting: Family Medicine

## 2022-11-20 ENCOUNTER — Emergency Department: Payer: PPO

## 2022-11-20 ENCOUNTER — Encounter: Payer: Self-pay | Admitting: Emergency Medicine

## 2022-11-20 ENCOUNTER — Emergency Department
Admission: EM | Admit: 2022-11-20 | Discharge: 2022-11-20 | Discharge status: Against Medical Advice | Payer: PPO | Attending: Emergency Medicine | Admitting: Emergency Medicine

## 2022-11-20 DIAGNOSIS — R110 Nausea: Secondary | ICD-10-CM | POA: Diagnosis not present

## 2022-11-20 DIAGNOSIS — R1013 Epigastric pain: Secondary | ICD-10-CM | POA: Diagnosis not present

## 2022-11-20 DIAGNOSIS — R1011 Right upper quadrant pain: Secondary | ICD-10-CM | POA: Diagnosis not present

## 2022-11-20 DIAGNOSIS — R079 Chest pain, unspecified: Secondary | ICD-10-CM | POA: Diagnosis not present

## 2022-11-20 DIAGNOSIS — M9979 Connective tissue and disc stenosis of intervertebral foramina of abdomen and other regions: Secondary | ICD-10-CM

## 2022-11-20 DIAGNOSIS — Z5321 Procedure and treatment not carried out due to patient leaving prior to being seen by health care provider: Secondary | ICD-10-CM | POA: Diagnosis not present

## 2022-11-20 LAB — TROPONIN I (HIGH SENSITIVITY)
Troponin I (High Sensitivity): 10 ng/L (ref <18)
Troponin I (High Sensitivity): 10 ng/L (ref <18)

## 2022-11-20 LAB — CBC WITH DIFFERENTIAL/PLATELET
Abs Immature Granulocytes: 0.05 10*3/uL (ref 0.00–0.07)
Basophils Absolute: 0.1 10*3/uL (ref 0.0–0.1)
Basophils Relative: 0 %
Eosinophils Absolute: 0 10*3/uL (ref 0.0–0.5)
Eosinophils Relative: 0 %
HCT: 53.3 % — ABNORMAL HIGH (ref 39.0–52.0)
Hemoglobin: 16.8 g/dL (ref 13.0–17.0)
Immature Granulocytes: 0 %
Lymphocytes Relative: 16 %
Lymphs Abs: 2.3 10*3/uL (ref 0.7–4.0)
MCH: 29.9 pg (ref 26.0–34.0)
MCHC: 31.5 g/dL (ref 30.0–36.0)
MCV: 95 fL (ref 80.0–100.0)
Monocytes Absolute: 0.9 10*3/uL (ref 0.1–1.0)
Monocytes Relative: 7 %
Neutro Abs: 10.6 10*3/uL — ABNORMAL HIGH (ref 1.7–7.7)
Neutrophils Relative %: 77 %
Platelets: 203 10*3/uL (ref 150–400)
RBC: 5.61 MIL/uL (ref 4.22–5.81)
RDW: 15.9 % — ABNORMAL HIGH (ref 11.5–15.5)
WBC: 13.9 10*3/uL — ABNORMAL HIGH (ref 4.0–10.5)
nRBC: 0 % (ref 0.0–0.2)

## 2022-11-20 LAB — COMPREHENSIVE METABOLIC PANEL
ALT: 22 U/L (ref 0–44)
AST: 30 U/L (ref 15–41)
Albumin: 3.7 g/dL (ref 3.5–5.0)
Alkaline Phosphatase: 43 U/L (ref 38–126)
Anion gap: 13 (ref 5–15)
BUN: 23 mg/dL (ref 8–23)
CO2: 30 mmol/L (ref 22–32)
Calcium: 8.4 mg/dL — ABNORMAL LOW (ref 8.9–10.3)
Chloride: 96 mmol/L — ABNORMAL LOW (ref 98–111)
Creatinine, Ser: 1.31 mg/dL — ABNORMAL HIGH (ref 0.61–1.24)
GFR, Estimated: 57 mL/min — ABNORMAL LOW (ref >60)
Glucose, Bld: 184 mg/dL — ABNORMAL HIGH (ref 70–99)
Potassium: 3.9 mmol/L (ref 3.5–5.1)
Sodium: 139 mmol/L (ref 135–145)
Total Bilirubin: 0.8 mg/dL (ref 0.3–1.2)
Total Protein: 7.2 g/dL (ref 6.5–8.1)

## 2022-11-20 LAB — URINALYSIS, ROUTINE W REFLEX MICROSCOPIC
Bacteria, UA: NONE SEEN
Bilirubin Urine: NEGATIVE
Glucose, UA: >=500 mg/dL — AB
Ketones, ur: NEGATIVE mg/dL
Leukocytes,Ua: NEGATIVE
Nitrite: NEGATIVE
Protein, ur: NEGATIVE mg/dL
Specific Gravity, Urine: 1.024 (ref 1.005–1.030)
Squamous Epithelial / HPF: NONE SEEN (ref 0–5)
pH: 5 (ref 5.0–8.0)

## 2022-11-20 LAB — LIPASE, BLOOD: Lipase: 29 U/L (ref 11–51)

## 2022-11-20 MED ORDER — HYDROCODONE-ACETAMINOPHEN 5-325 MG PO TABS: 1.0000 | ORAL_TABLET | Freq: Four times a day (QID) | ORAL | 0 refills | Status: DC | PRN

## 2022-11-20 NOTE — ED Notes (Signed)
Spoke with lab tech who st 2nd troponin was received "in error"; will reorder the 2nd troponin and draw now

## 2022-11-20 NOTE — ED Triage Notes (Signed)
Pt to ED from home c/o RUQ pain radiating across abd and into mid chest over last week.  Nausea without vomiting.  Patient states pain worse after eating and drinking.  Some pain has been going on over a year but getting worse recently and has endoscopy and colonoscopy scheduled.  Pt A&Ox4, chest rise even and unlabored, constantly clearing throat in triage in attempt to make stomach pain better.  Roderic Palau, [place] in triage for MSE.

## 2022-11-20 NOTE — ED Provider Triage Note (Signed)
Emergency Medicine Provider Triage Evaluation Note  Joseph Macdonald , a 76 y.o. male  was evaluated in triage.  Pt complains of nausea, epigastric abdominal pain. Symptoms radiate into chest. Ongoing x 1 year. Worse the past week.  Review of Systems  Positive: Epigastric abd pain Negative: Emesis, diarrhea, shob  Physical Exam  There were no vitals taken for this visit. Gen:   Awake, no distress   Resp:  Normal effort  MSK:   Moves extremities without difficulty  Other:    Medical Decision Making  Medically screening exam initiated at 8:00 PM.  Appropriate orders placed.  Joseph Macdonald was informed that the remainder of the evaluation will be completed by another provider, this initial triage assessment does not replace that evaluation, and the importance of remaining in the ED until their evaluation is complete.  Labs, EKG, chest xray, CT   Brynda Peon 11/20/22 2003

## 2022-11-21 ENCOUNTER — Ambulatory Visit: Payer: Self-pay | Admitting: *Deleted

## 2022-11-21 NOTE — Telephone Encounter (Signed)
Answer Assessment - Initial Assessment Questions 1. LOCATION: "Where does it hurt?"      Upper, below rib cage, middle 2. RADIATION: "Does the pain shoot anywhere else?" (e.g., chest, back)     No 3. ONSET: "When did the pain begin?" (Minutes, hours or days ago)      "Months ago" 2 days worsening 4. SUDDEN: "Gradual or sudden onset?"     Gradual 5. PATTERN "Does the pain come and go, or is it constant?"    - If it comes and goes: "How long does it last?" "Do you have pain now?"     (Note: Comes and goes means the pain is intermittent. It goes away completely between bouts.)    - If constant: "Is it getting better, staying the same, or getting worse?"      (Note: Constant means the pain never goes away completely; most serious pain is constant and gets worse.)       6. SEVERITY: "How bad is the pain?"  (e.g., Scale 1-10; mild, moderate, or severe)    - MILD (1-3): Doesn't interfere with normal activities, abdomen soft and not tender to touch.     - MODERATE (4-7): Interferes with normal activities or awakens from sleep, abdomen tender to touch.     - SEVERE (8-10): Excruciating pain, doubled over, unable to do any normal activities.        7. RECURRENT SYMPTOM: "Have you ever had this type of stomach pain before?" If Yes, ask: "When was the last time?" and "What happened that time?"       8. CAUSE: "What do you think is causing the stomach pain?"     Unsure 9. RELIEVING/AGGRAVATING FACTORS: "What makes it better or worse?" (e.g., antacids, bending or twisting motion, bowel movement)     Worse after eating 10. OTHER SYMPTOMS: "Do you have any other symptoms?" (e.g., back pain, diarrhea, fever, urination pain, vomiting)       Nausea  Protocols used: Abdominal Pain - Male-A-AH

## 2022-11-21 NOTE — Telephone Encounter (Signed)
  Chief Complaint: Abdominal Pain Symptoms: Upper abdominal pain, radiates to right side of chest, 7-8/10, constant. Nausea, no vomiting. States "Throbbing pain."  Went to ED yesterday, eloped, labs draw, available in Epic. Frequency: "Months but worsening past 2 days." Pertinent Negatives: Patient denies fever Disposition: '[x]'$ ED /'[]'$ Urgent Care (no appt availability in office) / '[]'$ Appointment(In office/virtual)/ '[]'$  [facility]/ '[]'$ Home Care/ '[]'$ Refused Recommended Disposition /'[]'$ [facility]/ '[]'$  Follow-up with PCP Additional Notes: Pt moaning on phone during call. Advised ED, declines. CAlled practice for consult, Sharyn Lull. Reiterates need for ED, clinic options given. Pt states he will just go back to hospital ED.  Care advise provided, verbalizes understanding. Reason for Disposition  [1] SEVERE pain (e.g., excruciating) AND [2] present > 1 hour  Answer Assessment - Initial Assessment Questions 1. LOCATION: "Where does it hurt?"      Upper, below rib cage, middle 2. RADIATION: "Does the pain shoot anywhere else?" (e.g., chest, back)     No 3. ONSET: "When did the pain begin?" (Minutes, hours or days ago)      "Months ago" 2 days worsening 4. SUDDEN: "Gradual or sudden onset?"     Gradual 5. PATTERN "Does the pain come and go, or is it constant?"    - If it comes and goes: "How long does it last?" "Do you have pain now?"     (Note: Comes and goes means the pain is intermittent. It goes away completely between bouts.)    - If constant: "Is it getting better, staying the same, or getting worse?"      (Note: Constant means the pain never goes away completely; most serious pain is constant and gets worse.)      Constant 6. SEVERITY: "How bad is the pain?"  (e.g., Scale 1-10; mild, moderate, or severe)    - MILD (1-3): Doesn't interfere with normal activities, abdomen soft and not tender to touch.     - MODERATE (4-7): Interferes with normal activities or awakens from  sleep, abdomen tender to touch.     - SEVERE (8-10): Excruciating pain, doubled over, unable to do any normal activities.       7/10 7. RECURRENT SYMPTOM: "Have you ever had this type of stomach pain before?" If Yes, ask: "When was the last time?" and "What happened that time?"       8. CAUSE: "What do you think is causing the stomach pain?"     Unsure 9. RELIEVING/AGGRAVATING FACTORS: "What makes it better or worse?" (e.g., antacids, bending or twisting motion, bowel movement)     Worse after eating 10. OTHER SYMPTOMS: "Do you have any other symptoms?" (e.g., back pain, diarrhea, fever, urination pain, vomiting)       Nausea  Protocols used: Abdominal Pain - Male-A-AH

## 2022-11-30 ENCOUNTER — Encounter
Admission: RE | Disposition: A | Discharge status: Home / Self Care | Payer: Self-pay | Source: Home / Self Care | Attending: Gastroenterology

## 2022-11-30 ENCOUNTER — Encounter: Payer: Self-pay | Admitting: Gastroenterology

## 2022-11-30 ENCOUNTER — Ambulatory Visit
Admission: RE | Admit: 2022-11-30 | Discharge: 2022-11-30 | Disposition: A | Discharge status: Home / Self Care | Payer: PPO | Attending: Gastroenterology | Admitting: Gastroenterology

## 2022-11-30 ENCOUNTER — Ambulatory Visit: Payer: PPO | Admitting: Certified Registered"

## 2022-11-30 DIAGNOSIS — Z7984 Long term (current) use of oral hypoglycemic drugs: Secondary | ICD-10-CM | POA: Insufficient documentation

## 2022-11-30 DIAGNOSIS — D122 Benign neoplasm of ascending colon: Secondary | ICD-10-CM | POA: Diagnosis not present

## 2022-11-30 DIAGNOSIS — E119 Type 2 diabetes mellitus without complications: Secondary | ICD-10-CM | POA: Insufficient documentation

## 2022-11-30 DIAGNOSIS — Z79899 Other long term (current) drug therapy: Secondary | ICD-10-CM | POA: Diagnosis not present

## 2022-11-30 DIAGNOSIS — K635 Polyp of colon: Secondary | ICD-10-CM | POA: Diagnosis not present

## 2022-11-30 DIAGNOSIS — R112 Nausea with vomiting, unspecified: Secondary | ICD-10-CM | POA: Insufficient documentation

## 2022-11-30 DIAGNOSIS — D500 Iron deficiency anemia secondary to blood loss (chronic): Secondary | ICD-10-CM | POA: Insufficient documentation

## 2022-11-30 DIAGNOSIS — I509 Heart failure, unspecified: Secondary | ICD-10-CM | POA: Insufficient documentation

## 2022-11-30 DIAGNOSIS — Z87891 Personal history of nicotine dependence: Secondary | ICD-10-CM | POA: Insufficient documentation

## 2022-11-30 DIAGNOSIS — R1312 Dysphagia, oropharyngeal phase: Secondary | ICD-10-CM | POA: Insufficient documentation

## 2022-11-30 DIAGNOSIS — D509 Iron deficiency anemia, unspecified: Secondary | ICD-10-CM | POA: Insufficient documentation

## 2022-11-30 DIAGNOSIS — Z1211 Encounter for screening for malignant neoplasm of colon: Secondary | ICD-10-CM | POA: Diagnosis not present

## 2022-11-30 DIAGNOSIS — Z823 Family history of stroke: Secondary | ICD-10-CM | POA: Diagnosis not present

## 2022-11-30 DIAGNOSIS — Z8349 Family history of other endocrine, nutritional and metabolic diseases: Secondary | ICD-10-CM | POA: Diagnosis not present

## 2022-11-30 DIAGNOSIS — R109 Unspecified abdominal pain: Secondary | ICD-10-CM

## 2022-11-30 DIAGNOSIS — K219 Gastro-esophageal reflux disease without esophagitis: Secondary | ICD-10-CM | POA: Insufficient documentation

## 2022-11-30 DIAGNOSIS — Z7951 Long term (current) use of inhaled steroids: Secondary | ICD-10-CM | POA: Diagnosis not present

## 2022-11-30 DIAGNOSIS — K640 First degree hemorrhoids: Secondary | ICD-10-CM | POA: Diagnosis not present

## 2022-11-30 DIAGNOSIS — K5790 Diverticulosis of intestine, part unspecified, without perforation or abscess without bleeding: Secondary | ICD-10-CM | POA: Diagnosis not present

## 2022-11-30 DIAGNOSIS — Z8249 Family history of ischemic heart disease and other diseases of the circulatory system: Secondary | ICD-10-CM | POA: Insufficient documentation

## 2022-11-30 DIAGNOSIS — J449 Chronic obstructive pulmonary disease, unspecified: Secondary | ICD-10-CM | POA: Diagnosis not present

## 2022-11-30 DIAGNOSIS — F419 Anxiety disorder, unspecified: Secondary | ICD-10-CM | POA: Insufficient documentation

## 2022-11-30 DIAGNOSIS — G7000 Myasthenia gravis without (acute) exacerbation: Secondary | ICD-10-CM | POA: Diagnosis not present

## 2022-11-30 DIAGNOSIS — I110 Hypertensive heart disease with heart failure: Secondary | ICD-10-CM | POA: Insufficient documentation

## 2022-11-30 DIAGNOSIS — R1310 Dysphagia, unspecified: Secondary | ICD-10-CM

## 2022-11-30 DIAGNOSIS — Z86010 Personal history of colonic polyps: Secondary | ICD-10-CM

## 2022-11-30 HISTORY — PX: ESOPHAGOGASTRODUODENOSCOPY: SHX5428

## 2022-11-30 HISTORY — PX: COLONOSCOPY WITH PROPOFOL: SHX5780

## 2022-11-30 HISTORY — DX: Polyp of colon: K63.5

## 2022-11-30 SURGERY — COLONOSCOPY WITH PROPOFOL
Anesthesia: General

## 2022-11-30 MED ORDER — PROPOFOL 10 MG/ML IV BOLUS
INTRAVENOUS | Status: DC | PRN
  Administered 2022-11-30: 80 mg via INTRAVENOUS
  Administered 2022-11-30 (×4): 20 mg via INTRAVENOUS

## 2022-11-30 MED ORDER — LIDOCAINE HCL (CARDIAC) PF 100 MG/5ML IV SOSY
PREFILLED_SYRINGE | INTRAVENOUS | Status: DC | PRN
  Administered 2022-11-30: 100 mg via INTRAVENOUS

## 2022-11-30 MED ORDER — PHENYLEPHRINE HCL (PRESSORS) 10 MG/ML IV SOLN
INTRAVENOUS | Status: DC | PRN
  Administered 2022-11-30: 80 ug via INTRAVENOUS
  Administered 2022-11-30: 160 ug via INTRAVENOUS
  Administered 2022-11-30: 80 ug via INTRAVENOUS

## 2022-11-30 MED ORDER — PROPOFOL 500 MG/50ML IV EMUL
INTRAVENOUS | Status: DC | PRN
  Administered 2022-11-30: 150 ug/kg/min via INTRAVENOUS

## 2022-11-30 MED ORDER — SODIUM CHLORIDE 0.9 % IV SOLN
INTRAVENOUS | Status: DC
  Administered 2022-11-30: 1000 mL via INTRAVENOUS

## 2022-11-30 NOTE — Anesthesia Postprocedure Evaluation (Signed)
Anesthesia Post Note  Patient: Joseph Macdonald  Procedure(s) Performed: COLONOSCOPY WITH PROPOFOL ESOPHAGOGASTRODUODENOSCOPY (EGD)  Patient location during evaluation: PACU Anesthesia Type: General Level of consciousness: awake Pain management: satisfactory to patient Vital Signs Assessment: post-procedure vital signs reviewed and stable Respiratory status: spontaneous breathing Cardiovascular status: stable Anesthetic complications: no  No notable events documented.   Last Vitals:  Vitals:   11/30/22 0918 11/30/22 1015  BP: 119/61 96/60  Pulse: 98 75  Resp: 20 10  Temp: (!) 35.9 C (!) 35.9 C  SpO2: 95% 96%    Last Pain:  Vitals:   11/30/22 1015  TempSrc: Temporal  PainSc: Asleep                 VAN STAVEREN,Ilisha Blust

## 2022-11-30 NOTE — Transfer of Care (Signed)
Immediate Anesthesia Transfer of Care Note  Patient: Joseph Macdonald  Procedure(s) Performed: COLONOSCOPY WITH PROPOFOL ESOPHAGOGASTRODUODENOSCOPY (EGD)  Patient Location: PACU and Endoscopy Unit  Anesthesia Type:General  Level of Consciousness: drowsy  Airway & Oxygen Therapy: Patient Spontanous Breathing  Post-op Assessment: Report given to RN and Post -op Vital signs reviewed and stable  Post vital signs: Reviewed and stable  Last Vitals:  Vitals Value Taken Time  BP 61/26 11/30/22 1016  Temp 35.9 C 11/30/22 1015  Pulse 79 11/30/22 1017  Resp 14 11/30/22 1017  SpO2 98 % 11/30/22 1017  Vitals shown include unvalidated device data.  Last Pain:  Vitals:   11/30/22 1015  TempSrc: Temporal  PainSc: Asleep         Complications: No notable events documented.

## 2022-11-30 NOTE — H&P (Addendum)
Joseph Lame, MD [address] Joseph [place]donald, [place] [address] Phone:[phone] Fax : [phone]  Primary Care Physician:  [name], MD Primary Gastroenterologist:  Dr. Allen Norris  Pre-Procedure History & Physical: HPI:  ALIJA Macdonald is a 76 y.o. male is here for an endoscopy and colonoscopy.   Past Medical History:  Diagnosis Date   Anxiety    Arthritis    CHF (congestive heart failure) (HCC)    COPD (chronic obstructive pulmonary disease) (HCC)    Diabetes mellitus without complication (HCC)    diet controlled   Dyspnea    with activity   Hyperlipidemia    Hypertension    Myasthenia gravis ([place])    Oxygen deficit    Ventricular tachycardia [facility])     Past Surgical History:  Procedure Laterality Date   carpel tunnel sx     CATARACT EXTRACTION     right eye   CATARACT EXTRACTION W/PHACO Left 12/01/2020   Procedure: CATARACT EXTRACTION PHACO AND INTRAOCULAR LENS PLACEMENT (IOC) LEFT 10.83 01:14.1 14.6%;  Surgeon: Leandrew Koyanagi, MD;  Location: [facility];  Service: Ophthalmology;  Laterality: Left;   EYE SURGERY     INTRAVASCULAR PRESSURE WIRE/FFR STUDY N/A 05/16/2022   Procedure: INTRAVASCULAR PRESSURE WIRE/FFR STUDY;  Surgeon: Nelva Bush, MD;  Location: [facility];  Service: Cardiovascular;  Laterality: N/A;   RETINAL DETACHMENT SURGERY     RIGHT/LEFT HEART CATH AND CORONARY ANGIOGRAPHY N/A 05/16/2022   Procedure: RIGHT/LEFT HEART CATH AND CORONARY ANGIOGRAPHY;  Surgeon: Nelva Bush, MD;  Location: [facility];  Service: Cardiovascular;  Laterality: N/A;   SKIN GRAFT     Behind left knee   TONSILLECTOMY AND ADENOIDECTOMY      Prior to Admission medications   Medication Sig Start Date End Date Taking? Authorizing Provider  budesonide-formoterol (SYMBICORT) 160-4.5 MCG/ACT inhaler Inhale 2 puffs into the lungs 2 (two) times daily. 06/16/19  Yes [name], MD  cholecalciferol (VITAMIN D) 25 MCG  (1000 UNIT) tablet Take 2,000 Units by mouth daily.   Yes [provider]  digoxin (LANOXIN) 0.125 MG tablet Take 1 tablet (0.125 mg total) by mouth daily. 05/18/22  Yes Macdonald Sane, MD  empagliflozin (JARDIANCE) 25 MG TABS tablet Take 25 mg by mouth daily.   Yes [provider]  metoprolol succinate (TOPROL-XL) 25 MG 24 hr tablet Take 25 mg by mouth daily.   Yes [provider]  omeprazole (PRILOSEC) 40 MG capsule Take 40 mg by mouth 2 (two) times daily.    Yes [provider]  pramipexole (MIRAPEX) 0.5 MG tablet TAKE 1 TABLET BY MOUTH AT BEDTIME 10/15/22  Yes [name], MD  pravastatin (PRAVACHOL) 20 MG tablet Take 20 mg by mouth daily.   Yes [provider]  pyridostigmine (MESTINON) 60 MG tablet Take 1 tablet (60 mg total) by mouth 3 (three) times daily. 06/13/22  Yes Annita Brod, MD  sacubitril-valsartan (ENTRESTO) 24-26 MG Take 1 tablet (24/26 mg) by mouth twice daily   Yes [provider]  spironolactone (ALDACTONE) 25 MG tablet Take 25 mg by mouth daily. 07/05/22  Yes [provider]  tiotropium (SPIRIVA) 18 MCG inhalation capsule Place 18 mcg into inhaler and inhale daily.   Yes [provider]  torsemide (DEMADEX) 20 MG tablet Take 1 tablet (20 mg total) by mouth daily. You may take an additional tablet as needed for abnkle swelling in addition to your normal dose. 08/22/22 02/18/23 Yes [name]  J, MD  vitamin B-12 (CYANOCOBALAMIN) 1000 MCG tablet Take 1,000 mcg by mouth daily.   Yes [provider]  albuterol (PROVENTIL HFA;VENTOLIN HFA) 108 (90 Base) MCG/ACT inhaler Inhale into the lungs every 6 (six) hours as needed for wheezing or shortness of breath.    [provider]  apixaban (ELIQUIS) 5 MG TABS tablet Take 5 mg by mouth 2 (two) times daily.    [provider]  diazepam (VALIUM) 5 MG tablet Take 5 mg by mouth every 6 (six) hours as needed for anxiety. Patient not taking:  Reported on 11/30/2022    [provider]  HYDROcodone-acetaminophen (NORCO/VICODIN) 5-325 MG tablet Take 1 tablet by mouth every 6 (six) hours as needed for moderate pain. 11/20/22   [name], MD  OLANZapine zydis (ZYPREXA) 5 MG disintegrating tablet ALLOW ONE TABLET TO DISSOLVE IN THE MOUTH TWICE DAILY 10/16/22   [name], MD  terbinafine (LAMISIL) 250 MG tablet Take 250 mg by mouth daily. Patient not taking: Reported on 11/30/2022 06/03/21   [provider]    Allergies as of 11/15/2022 - Review Complete 11/14/2022  Allergen Reaction Noted   Lisinopril Swelling 04/22/2012    Family History  Problem Relation Age of Onset   Hypertension Mother    Thyroid disease Mother    Kidney disease Mother    Arthritis Mother    Stroke Father    Heart disease Father 62   Arthritis Father    Heart attack Father    Arthritis Other     Social History   Socioeconomic History   Marital status: Married    Spouse name: Not on file   Number of children: 1   Years of education: H/S   Highest education level: High school graduate  Occupational History   Occupation: Retired Building surveyor  Tobacco Use   Smoking status: Former    Packs/day: 0.50    Years: 40.00    Total pack years: 20.00    Types: Pipe, Cigarettes    Quit date: 08/19/2017    Years since quitting: 5.2   Smokeless tobacco: Former    Quit date: 08/19/2017   Tobacco comments:    previously smoked 1 1/2 pack since his 76 years old  Quit 2018  Vaping Use   Vaping Use: Former   Substances: Nicotine, Flavoring  Substance and Sexual Activity   Alcohol use: Yes    Comment: occasionally- monthly or less   Drug use: No   Sexual activity: Not on file  Other Topics Concern   Not on file  Social History Narrative   2 adopted children   Social Determinants of Health   Financial Resource Strain: Low Risk  (12/01/2021)   Overall Financial Resource Strain (CARDIA)    Difficulty of Paying  Living Expenses: Not hard at all  Food Insecurity: No Food Insecurity (12/01/2021)   Hunger Vital Sign    Worried About Running Out of Food in the Last Year: Never true    [place] in the Last Year: Never true  Transportation Needs: No Transportation Needs (12/01/2021)   PRAPARE - Hydrologist (Medical): No    Lack of Transportation (Non-Medical): No  Physical Activity: Inactive (12/01/2021)   Exercise Vital Sign    Days of Exercise per Week: 0 days    Minutes of Exercise per Session: 0 min  Stress: No Stress Concern Present (12/01/2021)   [employer] -  Occupational Stress Questionnaire    Feeling of Stress : Not at all  Social Connections: Moderately Isolated (12/01/2021)   Social Connection and Isolation Panel [NHANES]    Frequency of Communication with Friends and Family: Three times a week    Frequency of Social Gatherings with Friends and Family: Once a week    Attends Religious Services: Never    Marine scientist or Organizations: No    Attends Archivist Meetings: Never    Marital Status: Married  Human resources officer Violence: Not At Risk (12/01/2021)   Humiliation, Afraid, Rape, and Kick questionnaire    Fear of Current or Ex-Partner: No    Emotionally Abused: No    Physically Abused: No    Sexually Abused: No    Review of Systems: See HPI, otherwise negative ROS  Physical Exam: BP 119/61   Pulse 98   Temp (!) 96.7 F (35.9 C) (Temporal)   Resp 20   Ht '5\' 7"'$  (1.702 m)   Wt 99.1 kg   SpO2 95%   BMI 34.21 kg/m  General:   Alert,  pleasant and cooperative in NAD Head:  Normocephalic and atraumatic. Neck:  Supple; no masses or thyromegaly. Lungs:  Clear throughout to auscultation.    Heart:  Regular rate and rhythm. Abdomen:  Soft, nontender and nondistended. Normal bowel sounds, without guarding, and without rebound.   Neurologic:  Alert and  oriented x4;  grossly normal  neurologically.  Impression/Plan: Muhammed Teutsch Wadley is here for an endoscopy and colonoscopy to be performed for dysphagia and screening  Risks, benefits, limitations, and alternatives regarding  endoscopy and colonoscopy have been reviewed with the patient.  Questions have been answered.  All parties agreeable.   Joseph Lame, MD  11/30/2022, 10:14 AM

## 2022-11-30 NOTE — Anesthesia Preprocedure Evaluation (Signed)
Anesthesia Evaluation  Patient identified by MRN, date of birth, ID band Patient awake    Reviewed: Allergy & Precautions, NPO status , Patient's Chart, lab work & pertinent test results  Airway Mallampati: III  TM Distance: >3 FB Neck ROM: Full    Dental  (+) Poor Dentition, Chipped, Dental Advisory Given   Pulmonary shortness of breath and with exertion, COPD,  COPD inhaler, former smoker    + decreased breath sounds      Cardiovascular Exercise Tolerance: Poor hypertension, Pt. on medications +CHF and + DOE  III Rhythm:Regular     Neuro/Psych   Anxiety     Myasthenia Gravis  Neuromuscular disease    GI/Hepatic Neg liver ROS,GERD  Medicated,,  Endo/Other  diabetes, Type 2    Renal/GU      Musculoskeletal   Abdominal  (+) + obese  Peds negative pediatric ROS (+)  Hematology negative hematology ROS (+)   Anesthesia Other Findings   Reproductive/Obstetrics                             Anesthesia Physical Anesthesia Plan  ASA: 3  Anesthesia Plan: General   Post-op Pain Management:    Induction: Intravenous  PONV Risk Score and Plan:   Airway Management Planned: Natural Airway  Additional Equipment:   Intra-op Plan:   Post-operative Plan:   Informed Consent:   Plan Discussed with:   Anesthesia Plan Comments:        Anesthesia Quick Evaluation

## 2022-11-30 NOTE — Op Note (Addendum)
[facility] Gastroenterology Patient Name: Joseph Macdonald Procedure Date: 11/30/2022 9:29 AM MRN: [id number] Account #: [id number] Date of Birth: Jun 09, 1946 Admit Type: Outpatient Age: 76 Room: [facility] ENDO ROOM 4 Gender: Male Note Status: Finalized Instrument Name: Upper Endoscope [id number] Procedure:             Upper GI endoscopy Indications:           Dysphagia, Nausea with vomiting Providers:             Lucilla Lame MD, MD Medicines:             Propofol per Anesthesia Complications:         No immediate complications. Procedure:             Pre-Anesthesia Assessment:                        - Prior to the procedure, a History and Physical was                         performed, and patient medications and allergies were                         reviewed. The patient's tolerance of previous                         anesthesia was also reviewed. The risks and benefits                         of the procedure and the sedation options and risks                         were discussed with the patient. All questions were                         answered, and informed consent was obtained. Prior                         Anticoagulants: The patient has taken no anticoagulant                         or antiplatelet agents. ASA Grade Assessment: II - A                         patient with mild systemic disease. After reviewing                         the risks and benefits, the patient was deemed in                         satisfactory condition to undergo the procedure.                        After obtaining informed consent, the endoscope was                         passed under direct vision. Throughout the procedure,                         the  patient's blood pressure, pulse, and oxygen                         saturations were monitored continuously. The Endoscope                         was introduced through the mouth, and advanced to the                         third  part of duodenum. The upper GI endoscopy was                         accomplished without difficulty. The patient tolerated                         the procedure well. Findings:      The examined esophagus was normal.      The stomach was normal.      The examined duodenum was normal. Impression:            - Normal esophagus.                        - Normal stomach.                        - Normal examined duodenum.                        - No specimens collected. Recommendation:        - Discharge patient to home.                        - Resume previous diet.                        - Continue present medications. Procedure Code(s):     --- Professional ---                        [phone], Esophagogastroduodenoscopy, flexible,                         transoral; diagnostic, including collection of                         specimen(s) by brushing or washing, when performed                         (separate procedure) Diagnosis Code(s):     --- Professional ---                        D50.9, Iron deficiency anemia, unspecified CPT copyright 2022 American Medical Association. All rights reserved. The codes documented in this report are preliminary and upon coder review may  be revised to meet current compliance requirements. Lucilla Lame MD, MD 11/30/2022 9:57:51 AM This report has been signed electronically. Number of Addenda: 0 Note Initiated On: 11/30/2022 9:29 AM Estimated Blood Loss:  Estimated blood loss: none.      [facility]

## 2022-11-30 NOTE — Anesthesia Procedure Notes (Signed)
Procedure Name: MAC Date/Time: 11/30/2022 9:52 AM  Performed by: Biagio Borg, CRNAPre-anesthesia Checklist: Patient identified, Emergency Drugs available, Suction available, Patient being monitored and Timeout performed Patient Re-evaluated:Patient Re-evaluated prior to induction Oxygen Delivery Method: Simple face mask Induction Type: IV induction Placement Confirmation: positive ETCO2 and CO2 detector

## 2022-11-30 NOTE — Op Note (Addendum)
[facility] Gastroenterology Patient Name: Joseph Macdonald Procedure Date: 11/30/2022 9:35 AM MRN: [id number] Account #: [id number] Date of Birth: [birth year] Admit Type: Outpatient Age: 76 Room: [facility] ENDO ROOM 4 Gender: Male Note Status: Finalized Instrument Name: Jasper Riling [id number] Procedure:             Colonoscopy Indications:           Screening for colorectal malignant neoplasm Providers:             Lucilla Lame MD, MD Medicines:             Propofol per Anesthesia Complications:         No immediate complications. Procedure:             Pre-Anesthesia Assessment:                        - Prior to the procedure, a History and Physical was                         performed, and patient medications and allergies were                         reviewed. The patient's tolerance of previous                         anesthesia was also reviewed. The risks and benefits                         of the procedure and the sedation options and risks                         were discussed with the patient. All questions were                         answered, and informed consent was obtained. Prior                         Anticoagulants: The patient has taken no anticoagulant                         or antiplatelet agents. ASA Grade Assessment: II - A                         patient with mild systemic disease. After reviewing                         the risks and benefits, the patient was deemed in                         satisfactory condition to undergo the procedure.                        After obtaining informed consent, the colonoscope was                         passed under direct vision. Throughout the procedure,                         the patient's blood  pressure, pulse, and oxygen                         saturations were monitored continuously. The                         Colonoscope was introduced through the anus and                         advanced to the the  cecum, identified by appendiceal                         orifice and ileocecal valve. The colonoscopy was                         performed without difficulty. The patient tolerated                         the procedure well. The quality of the bowel                         preparation was excellent. Findings:      The perianal and digital rectal examinations were normal.      A 4 mm polyp was found in the ascending colon. The polyp was sessile.       The polyp was removed with a cold snare. Resection and retrieval were       complete.      Non-bleeding internal hemorrhoids were found during retroflexion. The       hemorrhoids were Grade I (internal hemorrhoids that do not prolapse). Impression:            - One 4 mm polyp in the ascending colon, removed with                         a cold snare. Resected and retrieved.                        - Non-bleeding internal hemorrhoids. Recommendation:        - Discharge patient to home.                        - Resume previous diet.                        - Continue present medications.                        - Await pathology results. Procedure Code(s):     --- Professional ---                        [phone], Colonoscopy, flexible; with removal of                         tumor(s), polyp(s), or other lesion(s) by snare                         technique Diagnosis Code(s):     --- Professional ---  D50.9, Iron deficiency anemia, unspecified                        D12.2, Benign neoplasm of ascending colon CPT copyright 2022 American Medical Association. All rights reserved. The codes documented in this report are preliminary and upon coder review may  be revised to meet current compliance requirements. Lucilla Lame MD, MD 11/30/2022 10:12:21 AM This report has been signed electronically. Number of Addenda: 0 Note Initiated On: 11/30/2022 9:35 AM Scope Withdrawal Time: 0 hours 7 minutes 4 seconds  Total Procedure Duration:  0 hours 9 minutes 47 seconds  Estimated Blood Loss:  Estimated blood loss: none.      [facility]

## 2022-12-01 LAB — SURGICAL PATHOLOGY

## 2022-12-02 ENCOUNTER — Encounter: Payer: Self-pay | Admitting: Gastroenterology

## 2022-12-14 ENCOUNTER — Ambulatory Visit (INDEPENDENT_AMBULATORY_CARE_PROVIDER_SITE_OTHER): Payer: PPO

## 2022-12-14 VITALS — BP 110/60 | Ht 67.0 in | Wt 222.8 lb

## 2022-12-14 DIAGNOSIS — R1313 Dysphagia, pharyngeal phase: Secondary | ICD-10-CM | POA: Insufficient documentation

## 2022-12-14 DIAGNOSIS — Z719 Counseling, unspecified: Secondary | ICD-10-CM | POA: Insufficient documentation

## 2022-12-14 DIAGNOSIS — H269 Unspecified cataract: Secondary | ICD-10-CM | POA: Insufficient documentation

## 2022-12-14 DIAGNOSIS — J9612 Chronic respiratory failure with hypercapnia: Secondary | ICD-10-CM | POA: Insufficient documentation

## 2022-12-14 DIAGNOSIS — G4733 Obstructive sleep apnea (adult) (pediatric): Secondary | ICD-10-CM | POA: Insufficient documentation

## 2022-12-14 DIAGNOSIS — I5020 Unspecified systolic (congestive) heart failure: Secondary | ICD-10-CM | POA: Insufficient documentation

## 2022-12-14 DIAGNOSIS — R1312 Dysphagia, oropharyngeal phase: Secondary | ICD-10-CM | POA: Insufficient documentation

## 2022-12-14 DIAGNOSIS — Z7181 Spiritual or religious counseling: Secondary | ICD-10-CM | POA: Insufficient documentation

## 2022-12-14 DIAGNOSIS — I2699 Other pulmonary embolism without acute cor pulmonale: Secondary | ICD-10-CM | POA: Insufficient documentation

## 2022-12-14 DIAGNOSIS — Z0000 Encounter for general adult medical examination without abnormal findings: Secondary | ICD-10-CM

## 2022-12-14 DIAGNOSIS — G5601 Carpal tunnel syndrome, right upper limb: Secondary | ICD-10-CM | POA: Insufficient documentation

## 2022-12-14 DIAGNOSIS — Z5181 Encounter for therapeutic drug level monitoring: Secondary | ICD-10-CM | POA: Insufficient documentation

## 2022-12-14 DIAGNOSIS — L501 Idiopathic urticaria: Secondary | ICD-10-CM | POA: Insufficient documentation

## 2022-12-14 DIAGNOSIS — E119 Type 2 diabetes mellitus without complications: Secondary | ICD-10-CM | POA: Insufficient documentation

## 2022-12-14 DIAGNOSIS — I10 Essential (primary) hypertension: Secondary | ICD-10-CM | POA: Insufficient documentation

## 2022-12-14 DIAGNOSIS — I509 Heart failure, unspecified: Secondary | ICD-10-CM | POA: Insufficient documentation

## 2022-12-14 DIAGNOSIS — J449 Chronic obstructive pulmonary disease, unspecified: Secondary | ICD-10-CM | POA: Insufficient documentation

## 2022-12-14 DIAGNOSIS — I472 Ventricular tachycardia, unspecified: Secondary | ICD-10-CM | POA: Insufficient documentation

## 2022-12-14 DIAGNOSIS — I5041 Acute combined systolic (congestive) and diastolic (congestive) heart failure: Secondary | ICD-10-CM | POA: Insufficient documentation

## 2022-12-14 DIAGNOSIS — I468 Cardiac arrest due to other underlying condition: Secondary | ICD-10-CM | POA: Insufficient documentation

## 2022-12-14 DIAGNOSIS — F17200 Nicotine dependence, unspecified, uncomplicated: Secondary | ICD-10-CM | POA: Insufficient documentation

## 2022-12-14 DIAGNOSIS — Z7901 Long term (current) use of anticoagulants: Secondary | ICD-10-CM | POA: Insufficient documentation

## 2022-12-14 DIAGNOSIS — G7000 Myasthenia gravis without (acute) exacerbation: Secondary | ICD-10-CM | POA: Insufficient documentation

## 2022-12-14 DIAGNOSIS — H524 Presbyopia: Secondary | ICD-10-CM | POA: Insufficient documentation

## 2022-12-14 DIAGNOSIS — R531 Weakness: Secondary | ICD-10-CM | POA: Insufficient documentation

## 2022-12-14 DIAGNOSIS — Z122 Encounter for screening for malignant neoplasm of respiratory organs: Secondary | ICD-10-CM | POA: Insufficient documentation

## 2022-12-14 DIAGNOSIS — M2550 Pain in unspecified joint: Secondary | ICD-10-CM | POA: Insufficient documentation

## 2022-12-14 DIAGNOSIS — Z7189 Other specified counseling: Secondary | ICD-10-CM | POA: Insufficient documentation

## 2022-12-14 DIAGNOSIS — M545 Low back pain, unspecified: Secondary | ICD-10-CM | POA: Insufficient documentation

## 2022-12-14 DIAGNOSIS — R269 Unspecified abnormalities of gait and mobility: Secondary | ICD-10-CM | POA: Insufficient documentation

## 2022-12-14 DIAGNOSIS — E669 Obesity, unspecified: Secondary | ICD-10-CM | POA: Insufficient documentation

## 2022-12-14 DIAGNOSIS — I2510 Atherosclerotic heart disease of native coronary artery without angina pectoris: Secondary | ICD-10-CM | POA: Insufficient documentation

## 2022-12-14 DIAGNOSIS — M25569 Pain in unspecified knee: Secondary | ICD-10-CM | POA: Insufficient documentation

## 2022-12-14 DIAGNOSIS — Z659 Problem related to unspecified psychosocial circumstances: Secondary | ICD-10-CM | POA: Insufficient documentation

## 2022-12-14 NOTE — Progress Notes (Signed)
Subjective:   Joseph Macdonald is a 77 y.o. male who presents for Medicare Annual/Subsequent preventive examination.  Review of Systems     Cardiac Risk Factors include: advanced age (>62mn, >>46women);male gender;hypertension;obesity (BMI >30kg/m2);sedentary lifestyle;smoking/ tobacco exposure     Objective:    Today's Vitals   12/14/22 1516 12/14/22 1517  BP: 110/60   Weight: 222 lb 12.8 oz (101.1 kg)   Height: '5\' 7"'$  (1.702 m)   PainSc:  5    Body mass index is 34.9 kg/m.     12/14/2022    3:24 PM 11/30/2022    9:20 AM 11/20/2022    8:03 PM 06/08/2022    2:40 PM 05/17/2022    7:00 PM 05/16/2022    1:13 PM 05/11/2022    8:00 PM  Advanced Directives  Does Patient Have a Medical Advance Directive? Yes No No No Yes Yes Yes  Type of AParamedicof [place] will    Healthcare Power of [place]Living will Living will;Healthcare Power of Attorney  Does patient want to make changes to medical advance directive? No - Patient declined   No - Patient declined No - Patient declined No - Patient declined No - Patient declined  Copy of HKiptonin Chart? Yes - validated most recent copy scanned in chart (See row information)        Would patient like information on creating a medical advance directive?    No - Patient declined       Current Medications (verified) Outpatient Encounter Medications as of 12/14/2022  Medication Sig   albuterol (PROVENTIL HFA;VENTOLIN HFA) 108 (90 Base) MCG/ACT inhaler Inhale into the lungs every 6 (six) hours as needed for wheezing or shortness of breath.   apixaban (ELIQUIS) 5 MG TABS tablet Take 5 mg by mouth 2 (two) times daily.   cholecalciferol (VITAMIN D) 25 MCG (1000 UNIT) tablet Take 2,000 Units by mouth daily.   digoxin (LANOXIN) 0.125 MG tablet Take 1 tablet (0.125 mg total) by mouth daily.   empagliflozin (JARDIANCE) 25 MG TABS tablet Take 25 mg by mouth daily.    HYDROcodone-acetaminophen (NORCO/VICODIN) 5-325 MG tablet Take 1 tablet by mouth every 6 (six) hours as needed for moderate pain.   metFORMIN (GLUCOPHAGE) 500 MG tablet Take 500 mg by mouth 2 (two) times daily with a meal.   metoprolol succinate (TOPROL-XL) 25 MG 24 hr tablet Take 25 mg by mouth daily.   OLANZapine zydis (ZYPREXA) 5 MG disintegrating tablet ALLOW ONE TABLET TO DISSOLVE IN THE MOUTH TWICE DAILY   omeprazole (PRILOSEC) 40 MG capsule Take 40 mg by mouth 2 (two) times daily.    pramipexole (MIRAPEX) 0.5 MG tablet TAKE 1 TABLET BY MOUTH AT BEDTIME   pravastatin (PRAVACHOL) 20 MG tablet Take 20 mg by mouth daily.   pyridostigmine (MESTINON) 60 MG tablet Take 1 tablet (60 mg total) by mouth 3 (three) times daily.   sacubitril-valsartan (ENTRESTO) 24-26 MG Take 1 tablet (24/26 mg) by mouth twice daily   spironolactone (ALDACTONE) 25 MG tablet Take 25 mg by mouth daily.   tiotropium (SPIRIVA) 18 MCG inhalation capsule Place 18 mcg into inhaler and inhale daily.   torsemide (DEMADEX) 20 MG tablet Take 1 tablet (20 mg total) by mouth daily. You may take an additional tablet as needed for abnkle swelling in addition to your normal dose.   vitamin B-12 (CYANOCOBALAMIN) 1000 MCG tablet Take 1,000 mcg by mouth daily.  budesonide-formoterol (SYMBICORT) 160-4.5 MCG/ACT inhaler Inhale 2 puffs into the lungs 2 (two) times daily. (Patient not taking: Reported on 12/14/2022)   diazepam (VALIUM) 5 MG tablet Take 5 mg by mouth every 6 (six) hours as needed for anxiety. (Patient not taking: Reported on 11/30/2022)   terbinafine (LAMISIL) 250 MG tablet Take 250 mg by mouth daily. (Patient not taking: Reported on 11/30/2022)   No facility-administered encounter medications on file as of 12/14/2022.    Allergies (verified) Lisinopril   History: Past Medical History:  Diagnosis Date   Anxiety    Arthritis    CHF (congestive heart failure) (HCC)    COPD (chronic obstructive pulmonary disease) (HCC)     Diabetes mellitus without complication (HCC)    diet controlled   Dyspnea    with activity   Hyperlipidemia    Hypertension    Myasthenia gravis ([place])    Oxygen deficit    Ventricular tachycardia [facility])    Past Surgical History:  Procedure Laterality Date   carpel tunnel sx     CATARACT EXTRACTION     right eye   CATARACT EXTRACTION W/PHACO Left 12/01/2020   Procedure: CATARACT EXTRACTION PHACO AND INTRAOCULAR LENS PLACEMENT (IOC) LEFT 10.83 01:14.1 14.6%;  Surgeon: Leandrew Koyanagi, MD;  Location: [place];  Service: Ophthalmology;  Laterality: Left;   COLONOSCOPY WITH PROPOFOL N/A 11/30/2022   Procedure: COLONOSCOPY WITH PROPOFOL;  Surgeon: Lucilla Lame, MD;  Location: [facility] ENDOSCOPY;  Service: Endoscopy;  Laterality: N/A;   ESOPHAGOGASTRODUODENOSCOPY N/A 11/30/2022   Procedure: ESOPHAGOGASTRODUODENOSCOPY (EGD);  Surgeon: Lucilla Lame, MD;  Location: [facility] ENDOSCOPY;  Service: Endoscopy;  Laterality: N/A;   EYE SURGERY     INTRAVASCULAR PRESSURE WIRE/FFR STUDY N/A 05/16/2022   Procedure: INTRAVASCULAR PRESSURE WIRE/FFR STUDY;  Surgeon: Nelva Bush, MD;  Location: [place] CV LAB;  Service: Cardiovascular;  Laterality: N/A;   RETINAL DETACHMENT SURGERY     RIGHT/LEFT HEART CATH AND CORONARY ANGIOGRAPHY N/A 05/16/2022   Procedure: RIGHT/LEFT HEART CATH AND CORONARY ANGIOGRAPHY;  Surgeon: Nelva Bush, MD;  Location: [place] CV LAB;  Service: Cardiovascular;  Laterality: N/A;   SKIN GRAFT     Behind left knee   TONSILLECTOMY AND ADENOIDECTOMY     Family History  Problem Relation Age of Onset   Hypertension Mother    Thyroid disease Mother    Kidney disease Mother    Arthritis Mother    Stroke Father    Heart disease Father 36   Arthritis Father    Heart attack Father    Arthritis Other    Social History   Socioeconomic History   Marital status: Married    Spouse name: Not on file   Number of children: 1   Years of education: H/S    Highest education level: High school graduate  Occupational History   Occupation: Retired Building surveyor  Tobacco Use   Smoking status: Former    Packs/day: 0.50    Years: 40.00    Total pack years: 20.00    Types: Pipe, Cigarettes    Quit date: 08/19/2017    Years since quitting: 5.3   Smokeless tobacco: Former    Quit date: 08/19/2017   Tobacco comments:    previously smoked 1 1/2 pack since his 77 years old  Quit 2018  Vaping Use   Vaping Use: Former   Substances: Nicotine, Flavoring  Substance and Sexual Activity   Alcohol use: Yes    Comment: occasionally- monthly or less   Drug  use: No   Sexual activity: Not on file  Other Topics Concern   Not on file  Social History Narrative   2 adopted children   Social Determinants of Health   Financial Resource Strain: Low Risk  (12/14/2022)   Overall Financial Resource Strain (CARDIA)    Difficulty of Paying Living Expenses: Not hard at all  Food Insecurity: No Food Insecurity (12/14/2022)   Hunger Vital Sign    Worried About Running Out of Food in the Last Year: Never true    Ran Out of Food in the Last Year: Never true  Transportation Needs: No Transportation Needs (12/14/2022)   PRAPARE - Hydrologist (Medical): No    Lack of Transportation (Non-Medical): No  Physical Activity: Insufficiently Active (12/14/2022)   Exercise Vital Sign    Days of Exercise per Week: 2 days    Minutes of Exercise per Session: 20 min  Stress: No Stress Concern Present (12/14/2022)   [place]    Feeling of Stress : Not at all  Social Connections: Moderately Isolated (12/14/2022)   Social Connection and Isolation Panel [NHANES]    Frequency of Communication with Friends and Family: Twice a week    Frequency of Social Gatherings with Friends and Family: Once a week    Attends Religious Services: Never    Marine scientist or Organizations: No     Attends Music therapist: Never    Marital Status: Married    Tobacco Counseling Counseling given: Not Answered Tobacco comments: previously smoked 1 1/2 pack since his 77 years old  Quit 2018   Clinical Intake:  Pre-visit preparation completed: Yes  Pain : 0-10 Pain Score: 5  Pain Type: Chronic pain Pain Location: Back Pain Orientation: Lower     Nutritional Risks: None Diabetes: Yes CBG done?: No Did pt. bring in CBG monitor from home?: No  How often do you need to have someone help you when you read instructions, pamphlets, or other written materials from your doctor or pharmacy?: 1 - Never  Diabetic?no  Interpreter Needed?: No  Information entered by :: Kirke Shaggy, LPN   Activities of Daily Living    12/14/2022    3:26 PM 08/07/2022   12:56 PM  In your present state of health, do you have any difficulty performing the following activities:  Hearing? 1 1  Vision? 0 1  Difficulty concentrating or making decisions? 0 0  Walking or climbing stairs? 1 1  Dressing or bathing? 1 1  Doing errands, shopping? 1 1  Preparing Food and eating ? N   Using the Toilet? N   In the past six months, have you accidently leaked urine? N   Do you have problems with loss of bowel control? N   Managing your Medications? Y   Managing your Finances? N   Housekeeping or managing your Housekeeping? Y     Patient Care Team: [name], MD as PCP - General (Family Medicine) Rockey Situ Kathlene November, MD as PCP - Cardiology (Cardiology) Desiree Hane, [place] as Consulting Physician (Physician Assistant) Dorcas Carrow, MD as Referring Physician (Hematology and Oncology) Angelique Blonder, MD as Referring Physician (Neurology) Deboraha Sprang, MD as Consulting Physician (Cardiology) End, Harrell Gave, MD as Consulting Physician (Cardiology) Leandrew Koyanagi, MD as Referring Physician (Ophthalmology) Ree Edman, MD (Dermatology) Minna Merritts, MD as  Consulting Physician (Cardiology) Basil Dess, MD as Consulting Physician (Internal  Medicine)  Indicate any recent Medical Services you may have received from other than [facility] providers in the past year (date may be approximate).     Assessment:   This is a routine wellness examination for Jyden.  Hearing/Vision screen Hearing Screening - Comments:: No aids Vision Screening - Comments:: Wears glasses- [facility]  Dietary issues and exercise activities discussed: Current Exercise Habits: The patient does not participate in regular exercise at present   Goals Addressed             This Visit's Progress    DIET - INCREASE WATER INTAKE         Depression Screen    12/14/2022    3:22 PM 09/19/2022    2:58 PM 09/05/2022    2:16 PM 08/07/2022   12:56 PM 06/29/2022    8:37 AM 06/20/2022   10:45 AM 12/01/2021    9:40 AM  PHQ 2/9 Scores  PHQ - 2 Score 0 2 0 0 0 0 0  PHQ- 9 Score 0 '8 6   3     '$ Fall Risk    12/14/2022    3:25 PM 09/19/2022    2:57 PM 08/07/2022   12:56 PM 06/29/2022    8:36 AM 06/20/2022   10:45 AM  Fall Risk   Falls in the past year? 0 0 0 0 0  Number falls in past yr: 0 0 0 0 0  Injury with Fall? 0 0 0 0 0  Risk for fall due to : No Fall Risks;Impaired mobility;Impaired balance/gait No Fall Risks Impaired balance/gait;Impaired mobility Impaired balance/gait;Impaired mobility   Follow up Falls prevention discussed;Falls evaluation completed  Falls evaluation completed;Education provided;Falls prevention discussed Falls evaluation completed;Education provided;Falls prevention discussed Falls evaluation completed    FALL RISK PREVENTION PERTAINING TO THE HOME:  Any stairs in or around the home? No  If so, are there any without handrails? No  Home free of loose throw rugs in walkways, pet beds, electrical cords, etc? Yes  Adequate lighting in your home to reduce risk of falls? Yes   ASSISTIVE DEVICES UTILIZED TO PREVENT FALLS:  Life alert? Yes  Use  of a cane, walker or w/c? Yes  Grab bars in the bathroom? Yes  Shower chair or bench in shower? Yes  Elevated toilet seat or a handicapped toilet? Yes   TIMED UP AND GO:  Was the test performed? Yes .  Length of time to ambulate 10 feet: 6 sec.   Gait slow and steady with assistive device  Cognitive Function:        Immunizations Immunization History  Administered Date(s) Administered   COVID-19, mRNA, vaccine(Comirnaty)12 years and older 11/10/2022   Fluad Quad(high Dose 65+) 09/08/2020, 09/14/2021, 09/05/2022   Hepatitis B, adult 02/04/2019, 03/08/2020, 04/29/2020, 06/28/2020   Influenza, High Dose Seasonal PF 08/21/2015, 09/12/2016, 08/28/2017, 09/11/2018, 09/11/2019   Influenza, Seasonal, Injecte, Preservative Fre 11/10/2009, 09/27/2010, 08/15/2011, 09/06/2012   Influenza-Unspecified 10/20/2004, 10/17/2005, 09/18/2006, 09/24/2007, 09/22/2008, 08/11/2014, 08/25/2015, 08/22/2016, 08/11/2017, 08/12/2018, 08/11/2020, 08/20/2021, 09/05/2022   PFIZER Comirnaty(Gray Top)Covid-19 Tri-Sucrose Vaccine 05/24/2021   PFIZER(Purple Top)SARS-COV-2 Vaccination 01/22/2020, 02/16/2020, 10/21/2020   Pneumococcal Conjugate-13 03/11/2014   Pneumococcal Polysaccharide-23 11/10/2009, 04/08/2015   Rsv, Bivalent, Protein Subunit Rsvpref,pf Evans Lance) 11/10/2022   Td 11/10/2022   Tdap 09/06/2012   Zoster Recombinat (Shingrix) 11/27/2017, 02/04/2018    TDAP status: Due, Education has been provided regarding the importance of this vaccine. Advised may receive this vaccine at local pharmacy or Health Dept. Aware to provide a copy of  the vaccination record if obtained from local pharmacy or Health Dept. Verbalized acceptance and understanding.- [facility]  Flu Vaccine status: Up to date  Pneumococcal vaccine status: Due, Education has been provided regarding the importance of this vaccine. Advised may receive this vaccine at local pharmacy or Health Dept. Aware to provide a  copy of the vaccination record if obtained from local pharmacy or Health Dept. Verbalized acceptance and understanding.  Covid-19 vaccine status: Completed vaccines  Qualifies for Shingles Vaccine? Yes   Zostavax completed No   Shingrix Completed?: Yes  Screening Tests Health Maintenance  Topic Date Due   FOOT EXAM  Never done   OPHTHALMOLOGY EXAM  Never done   Hepatitis C Screening  Never done   HEMOGLOBIN A1C  10/31/2022   COVID-19 Vaccine (6 - 2023-24 season) 01/05/2023   COLONOSCOPY (Pts 45-65yr Insurance coverage will need to be confirmed)  12/01/2027   DTaP/Tdap/Td (3 - Td or Tdap) 11/10/2032   Pneumonia Vaccine 77 Years old  Completed   INFLUENZA VACCINE  Completed   Zoster Vaccines- Shingrix  Completed   HPV VACCINES  Aged Out    Health Maintenance  Health Maintenance Due  Topic Date Due   FOOT EXAM  Never done   OPHTHALMOLOGY EXAM  Never done   Hepatitis C Screening  Never done   HEMOGLOBIN A1C  10/31/2022    Colorectal cancer screening: Type of screening: Colonoscopy. Completed 11/30/22. Repeat every 5 years  Lung Cancer Screening: (Low Dose CT Chest recommended if Age 76-80years, 30 pack-year currently smoking OR have quit w/in 15years.) does qualify.   Lung Cancer Screening Referral: [facility]   Additional Screening:  Hepatitis C Screening: does qualify; Completed no  Vision Screening: Recommended annual ophthalmology exams for early detection of glaucoma and other disorders of the eye. Is the patient up to date with their annual eye exam?  Yes  Who is the provider or what is the name of the office in which the patient attends annual eye exams? [facility] is not established with a provider, would they like to be referred to a provider to establish care? No .   Dental Screening: Recommended annual dental exams for proper oral hygiene  Community Resource Referral / Chronic Care Management: CRR required this visit?  No   CCM  required this visit?  No      Plan:     I have personally reviewed and noted the following in the patient's chart:   Medical and social history Use of alcohol, tobacco or illicit drugs  Current medications and supplements including opioid prescriptions. Patient is currently taking opioid prescriptions. Information provided to patient regarding non-opioid alternatives. Patient advised to discuss non-opioid treatment plan with their provider. Functional ability and status Nutritional status Physical activity Advanced directives List of other physicians Hospitalizations, surgeries, and ER visits in previous 12 months Vitals Screenings to include cognitive, depression, and falls Referrals and appointments  In addition, I have reviewed and discussed with patient certain preventive protocols, quality metrics, and best practice recommendations. A written personalized care plan for preventive services as well as general preventive health recommendations were provided to patient.     LDionisio David LPN   17/05/7208  Nurse Notes: none

## 2022-12-14 NOTE — Patient Instructions (Signed)
[name] , Thank you for taking time to come for your Medicare Wellness Visit. I appreciate your ongoing commitment to your health goals. Please review the following plan we discussed and let me know if I can assist you in the future.   Screening recommendations/referrals: Colonoscopy: 11/30/22 Recommended yearly ophthalmology/optometry visit for glaucoma screening and checkup Recommended yearly dental visit for hygiene and checkup  Vaccinations: Influenza vaccine: 09/05/22 Pneumococcal vaccine: 03/11/14 Tdap vaccine: 11/10/22 Shingles vaccine: Shingrix 11/27/17, 02/04/18   Covid-19: 01/22/20, 02/16/20, 10/21/20, 05/24/21, 11/10/22  Advanced directives: yes  Conditions/risks identified: none  Next appointment: Follow up in one year for your annual wellness visit. 12/18/23 @ 2pm in person  Preventive Care 65 Years and Older, Male Preventive care refers to lifestyle choices and visits with your health care provider that can promote health and wellness. What does preventive care include? A yearly physical exam. This is also called an annual well check. Dental exams once or twice a year. Routine eye exams. Ask your health care provider how often you should have your eyes checked. Personal lifestyle choices, including: Daily care of your teeth and gums. Regular physical activity. Eating a healthy diet. Avoiding tobacco and drug use. Limiting alcohol use. Practicing safe sex. Taking low doses of aspirin every day. Taking vitamin and mineral supplements as recommended by your health care provider. What happens during an annual well check? The services and screenings done by your health care provider during your annual well check will depend on your age, overall health, lifestyle risk factors, and family history of disease. Counseling  Your health care provider may ask you questions about your: Alcohol use. Tobacco use. Drug use. Emotional well-being. Home and relationship  well-being. Sexual activity. Eating habits. History of falls. Memory and ability to understand (cognition). Work and work Statistician. Screening  You may have the following tests or measurements: Height, weight, and BMI. Blood pressure. Lipid and cholesterol levels. These may be checked every 5 years, or more frequently if you are over 59 years old. Skin check. Lung cancer screening. You may have this screening every year starting at age 76 if you have a 30-pack-year history of smoking and currently smoke or have quit within the past 15 years. Fecal occult blood test (FOBT) of the stool. You may have this test every year starting at age 76. Flexible sigmoidoscopy or colonoscopy. You may have a sigmoidoscopy every 5 years or a colonoscopy every 10 years starting at age 76. Prostate cancer screening. Recommendations will vary depending on your family history and other risks. Hepatitis C blood test. Hepatitis B blood test. Sexually transmitted disease (STD) testing. Diabetes screening. This is done by checking your blood sugar (glucose) after you have not eaten for a while (fasting). You may have this done every 1-3 years. Abdominal aortic aneurysm (AAA) screening. You may need this if you are a current or former smoker. Osteoporosis. You may be screened starting at age 76 if you are at high risk. Talk with your health care provider about your test results, treatment options, and if necessary, the need for more tests. Vaccines  Your health care provider may recommend certain vaccines, such as: Influenza vaccine. This is recommended every year. Tetanus, diphtheria, and acellular pertussis (Tdap, Td) vaccine. You may need a Td booster every 10 years. Zoster vaccine. You may need this after age 73. Pneumococcal 13-valent conjugate (PCV13) vaccine. One dose is recommended after age 41. Pneumococcal polysaccharide (PPSV23) vaccine. One dose is recommended after age 61. Talk to  your health care  provider about which screenings and vaccines you need and how often you need them. This information is not intended to replace advice given to you by your health care provider. Make sure you discuss any questions you have with your health care provider. Document Released: 12/24/2015 Document Revised: 08/16/2016 Document Reviewed: 09/28/2015 Elsevier Interactive Patient Education  2017 [place] Prevention in the Home Falls can cause injuries. They can happen to people of all ages. There are many things you can do to make your home safe and to help prevent falls. What can I do on the outside of my home? Regularly fix the edges of walkways and driveways and fix any cracks. Remove anything that might make you trip as you walk through a door, such as a raised step or threshold. Trim any bushes or trees on the path to your home. Use bright outdoor lighting. Clear any walking paths of anything that might make someone trip, such as rocks or tools. Regularly check to see if handrails are loose or broken. Make sure that both sides of any steps have handrails. Any raised decks and porches should have guardrails on the edges. Have any leaves, snow, or ice cleared regularly. Use sand or salt on walking paths during winter. Clean up any spills in your garage right away. This includes oil or grease spills. What can I do in the bathroom? Use night lights. Install grab bars by the toilet and in the tub and shower. Do not use towel bars as grab bars. Use non-skid mats or decals in the tub or shower. If you need to sit down in the shower, use a plastic, non-slip stool. Keep the floor dry. Clean up any water that spills on the floor as soon as it happens. Remove soap buildup in the tub or shower regularly. Attach bath mats securely with double-sided non-slip rug tape. Do not have throw rugs and other things on the floor that can make you trip. What can I do in the bedroom? Use night lights. Make  sure that you have a light by your bed that is easy to reach. Do not use any sheets or blankets that are too big for your bed. They should not hang down onto the floor. Have a firm chair that has side arms. You can use this for support while you get dressed. Do not have throw rugs and other things on the floor that can make you trip. What can I do in the kitchen? Clean up any spills right away. Avoid walking on wet floors. Keep items that you use a lot in easy-to-reach places. If you need to reach something above you, use a strong step stool that has a grab bar. Keep electrical cords out of the way. Do not use floor polish or wax that makes floors slippery. If you must use wax, use non-skid floor wax. Do not have throw rugs and other things on the floor that can make you trip. What can I do with my stairs? Do not leave any items on the stairs. Make sure that there are handrails on both sides of the stairs and use them. Fix handrails that are broken or loose. Make sure that handrails are as long as the stairways. Check any carpeting to make sure that it is firmly attached to the stairs. Fix any carpet that is loose or worn. Avoid having throw rugs at the top or bottom of the stairs. If you do have throw rugs, attach  them to the floor with carpet tape. Make sure that you have a light switch at the top of the stairs and the bottom of the stairs. If you do not have them, ask someone to add them for you. What else can I do to help prevent falls? Wear shoes that: Do not have high heels. Have rubber bottoms. Are comfortable and fit you well. Are closed at the toe. Do not wear sandals. If you use a stepladder: Make sure that it is fully opened. Do not climb a closed stepladder. Make sure that both sides of the stepladder are locked into place. Ask someone to hold it for you, if possible. Clearly mark and make sure that you can see: Any grab bars or handrails. First and last steps. Where the  edge of each step is. Use tools that help you move around (mobility aids) if they are needed. These include: Canes. Walkers. Scooters. Crutches. Turn on the lights when you go into a dark area. Replace any light bulbs as soon as they burn out. Set up your furniture so you have a clear path. Avoid moving your furniture around. If any of your floors are uneven, fix them. If there are any pets around you, be aware of where they are. Review your medicines with your doctor. Some medicines can make you feel dizzy. This can increase your chance of falling. Ask your doctor what other things that you can do to help prevent falls. This information is not intended to replace advice given to you by your health care provider. Make sure you discuss any questions you have with your health care provider. Document Released: 09/23/2009 Document Revised: 05/04/2016 Document Reviewed: 01/01/2015 Elsevier Interactive Patient Education  2017 [employer].

## 2022-12-18 ENCOUNTER — Other Ambulatory Visit: Payer: Self-pay | Admitting: Family Medicine

## 2022-12-18 DIAGNOSIS — M9979 Connective tissue and disc stenosis of intervertebral foramina of abdomen and other regions: Secondary | ICD-10-CM

## 2022-12-18 MED ORDER — HYDROCODONE-ACETAMINOPHEN 5-325 MG PO TABS: 1.0000 | ORAL_TABLET | Freq: Four times a day (QID) | ORAL | 0 refills | Status: DC | PRN

## 2023-01-10 ENCOUNTER — Telehealth: Payer: PPO | Admitting: Gastroenterology

## 2023-01-24 ENCOUNTER — Other Ambulatory Visit: Payer: Self-pay | Admitting: Family Medicine

## 2023-01-24 DIAGNOSIS — M9979 Connective tissue and disc stenosis of intervertebral foramina of abdomen and other regions: Secondary | ICD-10-CM

## 2023-01-25 MED ORDER — HYDROCODONE-ACETAMINOPHEN 5-325 MG PO TABS: 1.0000 | ORAL_TABLET | Freq: Four times a day (QID) | ORAL | 0 refills | Status: DC | PRN

## 2023-02-19 NOTE — Progress Notes (Signed)
Cardiology Office Note  Date:  02/20/2023   ID:  Joseph Macdonald, DOB November 20, 1946, MRN [id number]  PCP:  Malva Limes, MD   Chief Complaint  Patient presents with   6 month follow up     Patient c/o shortness of breath with walking a short distance. Medications reviewed by the patient verbally.     HPI:  Joseph Macdonald is a 77 y.o. male with history of  sustained VT,  myasthenia gravis with myasthenia crisis requiring plasma exchange at [facility] in 12/2018, June 2023 HFpEF,  HTN,  smoking, COPD, on oxygen HLD  HCM CAd: 60% LAD  June 2023 June 23: Ejection fraction estimated 35 to 40% on echo Echocardiogram October 2023 EF 55% who presents for follow up of HFpEF, atrial fibrillation on Eliquis, cardiomyopathy, acute on chronic respiratory failure  Last seen by myself in the clinic June 2023 Off oxygen  BP 115-125 systolic at home/50s Blood pressure low today, denies orthostasis symptoms   Taking torsemide 20 QOD No edema Breathing stable prednisone 15 QOD with 10 daily  Walking with walker,  short distances, mainly in the house  Biggest complaint today is GERD issues, has completed EGD/colo December 2023 with Dr. Servando Snare, no significant pathology noted Difficulty eating pieces of meat and chicken, has to wash them down, then feels full  Followed by [facility]  Compliant with his medications including low-dose digoxin, metoprolol succinate, low-dose Entresto, spironolactone, Jardiance, torsemide   EKG personally reviewed by myself on todays visit Normal sinus rhythm rate 68 bpm poor R wave progression through the precordial leads, left axis deviation, PVCs  June 2023 long hospitalization cardiac arrest secondary to respiratory failure pneumonia, cardiomyopathy, acute on chronic systolic CHF Ejection fraction estimated 35 to 40% on echo Treated with aggressive diuresis  Cardiac catheterization May 16, 2022 Moderate LAD disease, 60% lesion no intervention  performed Moderate pulmonary hypertension  Profound weakness during his hospitalization concerning for myasthenia gravis flare, treated with IVIG  Was discharged to [facility]  Last medical history reviewed [facility]  12/2018 for marked weakness consistent with myasthenia crisis. He underwent plasma exchange and was noted to have episodes of NSVT.    PMH:   has a past medical history of Anxiety, Arthritis, CHF (congestive heart failure) (HCC), COPD (chronic obstructive pulmonary disease) (HCC), Diabetes mellitus without complication (HCC), Dyspnea, Hyperlipidemia, Hypertension, Myasthenia gravis (HCC), Oxygen deficit, and Ventricular tachycardia (HCC).  PSH:    Past Surgical History:  Procedure Laterality Date   carpel tunnel sx     CATARACT EXTRACTION     right eye   CATARACT EXTRACTION W/PHACO Left 12/01/2020   Procedure: CATARACT EXTRACTION PHACO AND INTRAOCULAR LENS PLACEMENT (IOC) LEFT 10.83 01:14.1 14.6%;  Surgeon: Lockie Mola, MD;  Location: [facility];  Service: Ophthalmology;  Laterality: Left;   COLONOSCOPY WITH PROPOFOL N/A 11/30/2022   Procedure: COLONOSCOPY WITH PROPOFOL;  Surgeon: Midge Minium, MD;  Location: [facility] END[facility]COPY;  Service: Endoscopy;  Laterality: N/A;   ESOPHAGOGASTRODUODENOSCOPY N/A 11/30/2022   Procedure: ESOPHAGOGASTRODUODENOSCOPY (EGD);  Surgeon: Midge Minium, MD;  Location: [facility] ENDOSCOPY;  Service: Endoscopy;  Laterality: N/A;   EYE SURGERY     INTRAVASCULAR PRESSURE WIRE/FFR STUDY N/A 05/16/2022   Procedure: INTRAVASCULAR PRESSURE WIRE/FFR STUDY;  Surgeon: Yvonne Kendall, MD;  Location: [facility] INVASIVE CV LAB;  Service: Cardiovascular;  Laterality: N/A;   RETINAL DETACHMENT SURGERY     RIGHT/LEFT HEART CATH AND CORONARY ANGIOGRAPHY N/A 05/16/2022   Procedure: RIGHT/LEFT HEART CATH AND CORONARY ANGIOGRAPHY;  Surgeon:  End, Cristal Deer, MD;  Location: [facility] INVASIVE CV LAB;  Service: Cardiovascular;  Laterality: N/A;   SKIN GRAFT      Behind left knee   TONSILLECTOMY AND ADENOIDECTOMY      Current Outpatient Medications  Medication Sig Dispense Refill   albuterol (PROVENTIL HFA;VENTOLIN HFA) 108 (90 Base) MCG/ACT inhaler Inhale into the lungs every 6 (six) hours as needed for wheezing or shortness of breath.     apixaban (ELIQUIS) 5 MG TABS tablet Take 5 mg by mouth 2 (two) times daily.     cholecalciferol (VITAMIN D) 25 MCG (1000 UNIT) tablet Take 2,000 Units by mouth daily.     digoxin (LANOXIN) 0.125 MG tablet Take 1 tablet (0.125 mg total) by mouth daily.     empagliflozin (JARDIANCE) 25 MG TABS tablet Take 25 mg by mouth daily.     famotidine (PEPCID) 20 MG tablet Take 20 mg by mouth as needed.     HYDROcodone-acetaminophen (NORCO/VICODIN) 5-325 MG tablet Take 1 tablet by mouth every 6 (six) hours as needed for moderate pain. 120 tablet 0   metFORMIN (GLUCOPHAGE) 500 MG tablet Take 500 mg by mouth 2 (two) times daily with a meal.     metoprolol succinate (TOPROL-XL) 25 MG 24 hr tablet Take 25 mg by mouth daily.     OLANZapine zydis (ZYPREXA) 5 MG disintegrating tablet ALLOW ONE TABLET TO DISSOLVE IN THE MOUTH TWICE DAILY 60 tablet 3   omeprazole (PRILOSEC) 40 MG capsule Take 40 mg by mouth 2 (two) times daily.      pramipexole (MIRAPEX) 0.5 MG tablet TAKE 1 TABLET BY MOUTH AT BEDTIME 90 tablet 3   predniSONE (DELTASONE) 5 MG tablet Take 15 mg by mouth as directed.     promethazine (PHENERGAN) 12.5 MG tablet Take 12.5 mg by mouth every 4 (four) hours as needed.     pyridostigmine (MESTINON) 60 MG tablet Take 1 tablet (60 mg total) by mouth 3 (three) times daily. 90 tablet 3   sacubitril-valsartan (ENTRESTO) 24-26 MG Take 1 tablet (24/26 mg) by mouth twice daily     spironolactone (ALDACTONE) 25 MG tablet Take 25 mg by mouth daily.     tiotropium (SPIRIVA) 18 MCG inhalation capsule Place 18 mcg into inhaler and inhale daily.     torsemide (DEMADEX) 20 MG tablet Take 1 tablet (20 mg total) by mouth daily. You may take an  additional tablet as needed for abnkle swelling in addition to your normal dose. 180 tablet 0   vitamin B-12 (CYANOCOBALAMIN) 1000 MCG tablet Take 1,000 mcg by mouth daily.     budesonide-formoterol (SYMBICORT) 160-4.5 MCG/ACT inhaler Inhale 2 puffs into the lungs 2 (two) times daily. (Patient not taking: Reported on 12/14/2022) 1 Inhaler 0   diazepam (VALIUM) 5 MG tablet Take 5 mg by mouth every 6 (six) hours as needed for anxiety. (Patient not taking: Reported on 11/30/2022)     pravastatin (PRAVACHOL) 20 MG tablet Take 20 mg by mouth daily. (Patient not taking: Reported on 02/20/2023)     terbinafine (LAMISIL) 250 MG tablet Take 250 mg by mouth daily. (Patient not taking: Reported on 02/20/2023)     No current facility-administered medications for this visit.    Allergies:   Lisinopril   Social History:  The patient  reports that he quit smoking about 5 years ago. His smoking use included pipe and cigarettes. He has a 20.00 pack-year smoking history. He quit smokeless tobacco use about 5 years ago. He reports current alcohol use.  He reports that he does not use drugs.   Family History:   family history includes Arthritis in his father, mother, and another family member; Heart attack in his father; Heart disease (age of onset: 51) in his father; Hypertension in his mother; Kidney disease in his mother; Stroke in his father; Thyroid disease in his mother.    Review of Systems: Review of Systems  Constitutional: Negative.   HENT: Negative.    Respiratory:  Positive for shortness of breath.   Cardiovascular: Negative.   Gastrointestinal: Negative.   Musculoskeletal: Negative.   Neurological: Negative.   Psychiatric/Behavioral: Negative.    All other systems reviewed and are negative.   PHYSICAL EXAM: VS:  BP (!) 90/50 (BP Location: Left Arm, Patient Position: Sitting, Cuff Size: Normal)   Pulse 68   Ht 5\' 7"  (1.702 m)   Wt 214 lb (97.1 kg)   SpO2 94%   BMI 33.52 kg/m  , BMI Body mass  index is 33.52 kg/m. Constitutional:  oriented to person, place, and time. No distress.  HENT:  Head: Grossly normal Eyes:  no discharge. No scleral icterus.  Neck: No JVD, no carotid bruits  Cardiovascular: Regular rate and rhythm, no murmurs appreciated Pulmonary/Chest: Clear to auscultation bilaterally, no wheezes or rails Abdominal: Soft.  no distension.  no tenderness.  Musculoskeletal: Normal range of motion Neurological:  normal muscle tone. Coordination normal. No atrophy Skin: Skin warm and dry Psychiatric: normal affect, pleasant  Recent Labs: 04/30/2022: TSH 0.441 05/08/2022: Magnesium 2.1 06/08/2022: B Natriuretic Peptide 714.7 11/20/2022: ALT 22; BUN 23; Creatinine, Ser 1.31; Hemoglobin 16.8; Platelets 203; Potassium 3.9; Sodium 139    Lipid Panel Lab Results  Component Value Date   CHOL 141 04/03/2022   HDL 62 04/03/2022   LDLCALC 62 04/03/2022   TRIG 92 04/03/2022  Home  Wt Readings from Last 3 Encounters:  02/20/23 214 lb (97.1 kg)  12/14/22 222 lb 12.8 oz (101.1 kg)  11/30/22 218 lb 7.3 oz (99.1 kg)     ASSESSMENT AND PLAN:  Problem List Items Addressed This Visit       Cardiology Problems   Chronic heart failure with preserved ejection fraction (HFpEF) (HCC) - Primary   Ventricular tachycardia (HCC)   Essential hypertension     Other   Morbid obesity (HCC) (Chronic)   Type 2 diabetes mellitus (HCC)   Myasthenia gravis (HCC)   COPD (chronic obstructive pulmonary disease) (HCC)   Relevant Medications   predniSONE (DELTASONE) 5 MG tablet   promethazine (PHENERGAN) 12.5 MG tablet   Other Visit Diagnoses     Lymphedema       Hyperlipidemia, unspecified hyperlipidemia type       Hypertrophic cardiomyopathy (HCC)       Sinus bradycardia         Chronic respiratory distress Long history of smoking, underlying COPD Muscle weakness from myasthenia gravis Prior hospitalization for acute on chronic diastolic and systolic CHF in the setting of COPD  exacerbation requiring intubation -Pulmonary hypertension on catheterization Tolerating torsemide 20 milligrams every other day Off oxygen, [facility]  Shortness of breath/chronic diastolic and systolic CHF Exacerbated by morbid obesity, COPD, myasthenia gravis, cardiomyopathy Very deconditioned, On chronic prednisone Appears euvolemic, recommended extra torsemide for any worsening ankle swelling Likely with elevated right heart pressures /pulmonary hypertension in the setting of severe lung disease  Morbid obesity Weight stable, possibly trending downward, recommended low carbohydrate diet  Myasthenia gravis Remains on prednisone Prior treatment IVIG in  the hospital  Sustained VT Followed by Dr. Graciela Husbands  Paroxysmal atrial fibrillation On Eliquis Maintaining normal sinus rhythm Continue metoprolol  Leg pain Managed by Dr. Sherrie Mustache Very sedentary, walks in the house  GERD On omeprazole 40 twice daily, Pepcid   Total encounter time more than 30 minutes  Greater than 50% was spent in counseling and coordination of care with the patient   Signed, Dossie Arbour, M.D., Ph.D. [facility] [place], [place] [phone]

## 2023-02-20 ENCOUNTER — Encounter: Payer: Self-pay | Admitting: Cardiovascular Disease

## 2023-02-20 ENCOUNTER — Ambulatory Visit: Payer: PPO | Attending: Cardiovascular Disease | Admitting: Cardiovascular Disease

## 2023-02-20 VITALS — BP 90/50 | HR 68 | Ht 67.0 in | Wt 214.0 lb

## 2023-02-20 DIAGNOSIS — E785 Hyperlipidemia, unspecified: Secondary | ICD-10-CM

## 2023-02-20 DIAGNOSIS — I890 Lymphedema, not elsewhere classified: Secondary | ICD-10-CM | POA: Diagnosis not present

## 2023-02-20 DIAGNOSIS — J449 Chronic obstructive pulmonary disease, unspecified: Secondary | ICD-10-CM | POA: Diagnosis not present

## 2023-02-20 DIAGNOSIS — I422 Other hypertrophic cardiomyopathy: Secondary | ICD-10-CM

## 2023-02-20 DIAGNOSIS — E119 Type 2 diabetes mellitus without complications: Secondary | ICD-10-CM

## 2023-02-20 DIAGNOSIS — I472 Ventricular tachycardia, unspecified: Secondary | ICD-10-CM

## 2023-02-20 DIAGNOSIS — R001 Bradycardia, unspecified: Secondary | ICD-10-CM | POA: Diagnosis not present

## 2023-02-20 DIAGNOSIS — I5032 Chronic diastolic (congestive) heart failure: Secondary | ICD-10-CM

## 2023-02-20 DIAGNOSIS — G7000 Myasthenia gravis without (acute) exacerbation: Secondary | ICD-10-CM | POA: Diagnosis not present

## 2023-02-20 DIAGNOSIS — I10 Essential (primary) hypertension: Secondary | ICD-10-CM

## 2023-02-20 NOTE — Patient Instructions (Signed)
Medication Instructions:  No changes  If you need a refill on your cardiac medications before your next appointment, please call your pharmacy.    Lab work: No new labs needed   Testing/Procedures: No new testing needed   Follow-Up: At [facility], you and your health needs are our priority.  As part of our continuing mission to provide you with exceptional heart care, we have created designated Provider Care Teams.  These Care Teams include your primary Cardiologist (physician) and Advanced Practice Providers (APPs -  Physician Assistants and Nurse Practitioners) who all work together to provide you with the care you need, when you need it.  You will need a follow up appointment in 6 months  Providers on your designated Care Team:   Christopher Berge, NP Ryan Dunn, PA-C Cadence Furth, PA-C  COVID-19 Vaccine Information can be found at: [email or web address] For questions related to vaccine distribution or appointments, please email [email or web address] or call [phone].   

## 2023-02-22 NOTE — Addendum Note (Signed)
Addended by: Michel Santee on: 02/22/2023 02:33 PM   Modules accepted: Orders

## 2023-02-23 ENCOUNTER — Other Ambulatory Visit: Payer: Self-pay | Admitting: Family Medicine

## 2023-02-23 DIAGNOSIS — M9979 Connective tissue and disc stenosis of intervertebral foramina of abdomen and other regions: Secondary | ICD-10-CM

## 2023-02-23 MED ORDER — HYDROCODONE-ACETAMINOPHEN 5-325 MG PO TABS: 1.0000 | ORAL_TABLET | Freq: Four times a day (QID) | ORAL | 0 refills | Status: DC | PRN

## 2023-03-23 ENCOUNTER — Other Ambulatory Visit: Payer: Self-pay | Admitting: Family Medicine

## 2023-03-23 DIAGNOSIS — M9979 Connective tissue and disc stenosis of intervertebral foramina of abdomen and other regions: Secondary | ICD-10-CM

## 2023-03-23 MED ORDER — HYDROCODONE-ACETAMINOPHEN 5-325 MG PO TABS: 1.0000 | ORAL_TABLET | Freq: Four times a day (QID) | ORAL | 0 refills | Status: DC | PRN

## 2023-04-23 ENCOUNTER — Other Ambulatory Visit: Payer: Self-pay | Admitting: Family Medicine

## 2023-04-23 DIAGNOSIS — M9979 Connective tissue and disc stenosis of intervertebral foramina of abdomen and other regions: Secondary | ICD-10-CM

## 2023-04-23 MED ORDER — HYDROCODONE-ACETAMINOPHEN 5-325 MG PO TABS
1.0000 | ORAL_TABLET | Freq: Four times a day (QID) | ORAL | 0 refills | Status: DC | PRN
Start: 2023-04-23 — End: 2023-05-24

## 2023-05-04 DIAGNOSIS — H40003 Preglaucoma, unspecified, bilateral: Secondary | ICD-10-CM | POA: Diagnosis not present

## 2023-05-04 DIAGNOSIS — H40053 Ocular hypertension, bilateral: Secondary | ICD-10-CM | POA: Diagnosis not present

## 2023-05-17 ENCOUNTER — Telehealth: Payer: Self-pay

## 2023-05-17 DIAGNOSIS — I5032 Chronic diastolic (congestive) heart failure: Secondary | ICD-10-CM

## 2023-05-17 DIAGNOSIS — J449 Chronic obstructive pulmonary disease, unspecified: Secondary | ICD-10-CM

## 2023-05-17 NOTE — Telephone Encounter (Signed)
PharmD reviewed patient chart to assess eligibility for [facility] Coordination services. Patient was determined to be a good candidate for the program given the complexity of the medication regimen and/or overall risk for hospitalization and increased utilization.  Referral entered in order to outreach patient and offer appointment with PharmD. Referral cosigned to PCP.  

## 2023-05-24 ENCOUNTER — Other Ambulatory Visit: Payer: Self-pay | Admitting: Family Medicine

## 2023-05-24 DIAGNOSIS — M9979 Connective tissue and disc stenosis of intervertebral foramina of abdomen and other regions: Secondary | ICD-10-CM

## 2023-05-24 MED ORDER — HYDROCODONE-ACETAMINOPHEN 5-325 MG PO TABS
1.0000 | ORAL_TABLET | Freq: Four times a day (QID) | ORAL | 0 refills | Status: DC | PRN
Start: 2023-05-24 — End: 2023-06-22

## 2023-06-22 ENCOUNTER — Other Ambulatory Visit: Payer: Self-pay | Admitting: Family Medicine

## 2023-06-22 DIAGNOSIS — M9979 Connective tissue and disc stenosis of intervertebral foramina of abdomen and other regions: Secondary | ICD-10-CM

## 2023-06-23 MED ORDER — HYDROCODONE-ACETAMINOPHEN 5-325 MG PO TABS
1.0000 | ORAL_TABLET | Freq: Four times a day (QID) | ORAL | 0 refills | Status: DC | PRN
Start: 2023-06-23 — End: 2023-07-25

## 2023-07-25 ENCOUNTER — Other Ambulatory Visit: Payer: Self-pay | Admitting: Family Medicine

## 2023-07-25 DIAGNOSIS — M9979 Connective tissue and disc stenosis of intervertebral foramina of abdomen and other regions: Secondary | ICD-10-CM

## 2023-07-26 MED ORDER — HYDROCODONE-ACETAMINOPHEN 5-325 MG PO TABS
1.0000 | ORAL_TABLET | Freq: Four times a day (QID) | ORAL | 0 refills | Status: DC | PRN
Start: 2023-07-26 — End: 2023-08-22

## 2023-08-22 ENCOUNTER — Other Ambulatory Visit: Payer: Self-pay

## 2023-08-22 ENCOUNTER — Ambulatory Visit: Payer: Self-pay

## 2023-08-22 DIAGNOSIS — M9979 Connective tissue and disc stenosis of intervertebral foramina of abdomen and other regions: Secondary | ICD-10-CM

## 2023-08-22 NOTE — Telephone Encounter (Signed)
Requested medication (s) are due for refill today: Yes  Requested medication (s) are on the active medication list: Yes  Last refill:  07/26/23  Future visit scheduled: Yes  Notes to clinic:  Unable to refill per protocol, cannot delegate.      Requested Prescriptions  Pending Prescriptions Disp Refills   HYDROcodone-acetaminophen (NORCO/VICODIN) 5-325 MG tablet 120 tablet 0    Sig: Take 1 tablet by mouth every 6 (six) hours as needed for moderate pain.     Not Delegated - Analgesics:  Opioid Agonist Combinations Failed - 08/22/2023  5:18 PM      Failed - This refill cannot be delegated      Failed - Urine Drug Screen completed in last 360 days      Failed - Valid encounter within last 3 months    Recent Outpatient Visits           11 months ago RUQ abdominal pain   [facility] Simmons-Robinson, [place], MD   11 months ago Epigastric pain    [facility] [place], [place], MD   1 year ago Anxiety   [facility] Malva Limes, MD   1 year ago Hypoxia   [facility] Alfredia Ferguson, PA-C   2 years ago Encounter for immunization   [facility] Sherrie Mustache, Demetrios Isaacs, MD       Future Appointments             In 3 weeks Fisher, Demetrios Isaacs, MD [facility]

## 2023-08-22 NOTE — Telephone Encounter (Signed)
Chief Complaint: Hydrocodone not working for the pain Symptoms: restless legs not better, pain worse at night Frequency: N/A Pertinent Negatives: Patient denies other symptoms Disposition: [] ED /[] Urgent Care (no appt availability in office) / [] Appointment(In office/virtual)/ []  [facility]/ [] Home Care/ [] Refused Recommended Disposition /[] [facility]/ [x]  Follow-up with PCP Additional Notes: Patient's wife, per DPR, says the patient's leg pains are not being relieved by the hydrocodone and she called to see if they could come into the office to talk to Dr. Sherrie Mustache about prescribing something stronger for the pain and restless legs. Appointment scheduled for 09/14/23 by [facility]. She says a refill is also needed on the Hydrocodone, he has a few pills left. Advised I will send this to Dr. Sherrie Mustache for review.   Summary: rx concern / leg discomfort / rx req   The patient has been previously prescribed HYDROcodone-acetaminophen (NORCO/VICODIN) 5-325 MG tablet [[id number]] to help with their leg discomfort  The patient shares with their wife that they are continuing to experience discomfort related to their legs and believe the medication to be ineffective  The patient would like to be prescribed something for their symptoms of discomfort  Please contact further when possible     Reason for Disposition  Prescription request for new medicine (not a refill)  Answer Assessment - Initial Assessment Questions 1. NAME of MEDICINE: "What medicine(s) are you calling about?"     hydrocodone 2. QUESTION: "What is your question?" (e.g., double dose of medicine, side effect)     Not helping the pain, especially in the evening/night, pain is worse 3. PRESCRIBER: "Who prescribed the medicine?" Reason: if prescribed by specialist, call should be referred to that group.     Dr. Sherrie Mustache 4. SYMPTOMS: "Do you have any symptoms?" If Yes, ask: "What symptoms are you having?"  "How bad are the  symptoms (e.g., mild, moderate, severe)     Restless legs, unable to sleep due to legs bothering him  Protocols used: Medication Question Call-A-AH

## 2023-08-23 MED ORDER — HYDROCODONE-ACETAMINOPHEN 5-325 MG PO TABS
1.0000 | ORAL_TABLET | Freq: Four times a day (QID) | ORAL | 0 refills | Status: DC | PRN
Start: 2023-08-23 — End: 2023-10-19

## 2023-09-09 IMAGING — MR MR HEAD W/O CM
12 series · 48 of 48 positions shown · non-contrast
Comparison: CT head 05/01/2022

CLINICAL DATA: Anoxic brain injury

EXAM:
MRI HEAD WITHOUT CONTRAST
TECHNIQUE: Multiplanar, multiecho pulse sequences of the brain and surrounding
structures were obtained without intravenous contrast.

[Series 5: ax dwi_tracew · axial · 3.0mm · 1.31mm/px · z∈[-88,+54]mm · 4 of 48 slices shown]
[im 1/48]
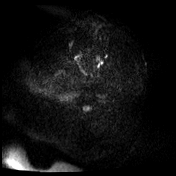
[im 16/48]
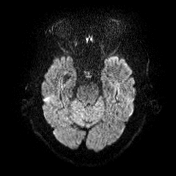
[im 32/48]
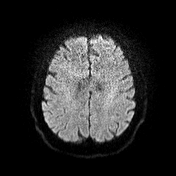
[im 48/48]
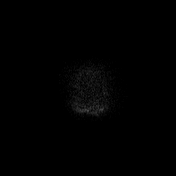

[Series 6: ax dwi_adc · axial · 3.0mm · 1.31mm/px · z∈[-88,+54]mm · 5 of 48 slices shown]
[im 1/48]
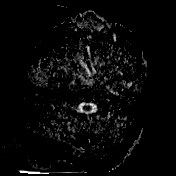
[im 12/48]
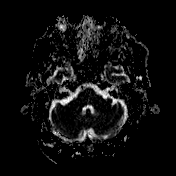
[im 24/48]
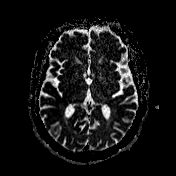
[im 36/48]
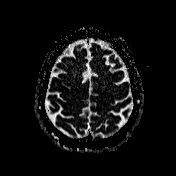
[im 48/48]
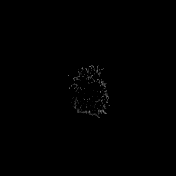

[Series 7: cor dwi_tracew · coronal · 5.0mm · 1.31mm/px · 4 of 38 slices shown]
[im 1/38]
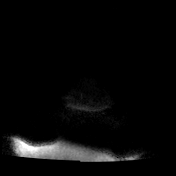
[im 13/38]
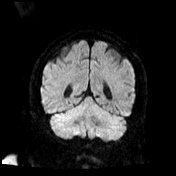
[im 25/38]
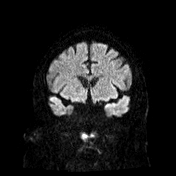
[im 38/38]
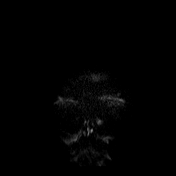

[Series 8: cor dwi_adc · coronal · 5.0mm · 1.31mm/px · 4 of 38 slices shown]
[im 1/38]
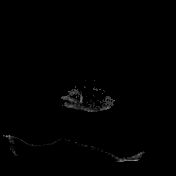
[im 13/38]
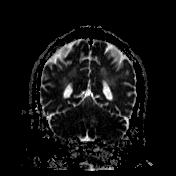
[im 25/38]
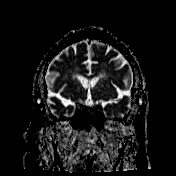
[im 38/38]
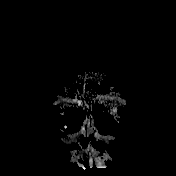

[Series 9: T1 · sagittal · 5.0mm · 0.94mm/px · 2 of 23 slices shown (1 of 2)]
[im 1/23]
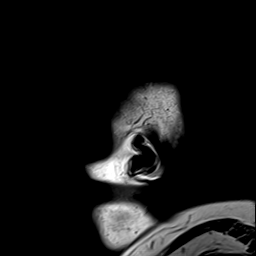
[im 23/23]
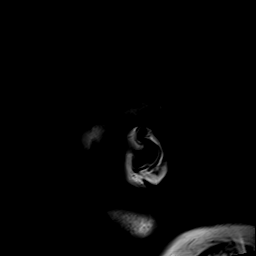

[Series 10: T2 · axial · 5.0mm · 0.45mm/px · z∈[-86,+56]mm · 3 of 27 slices shown (1 of 2)]
[im 1/27]
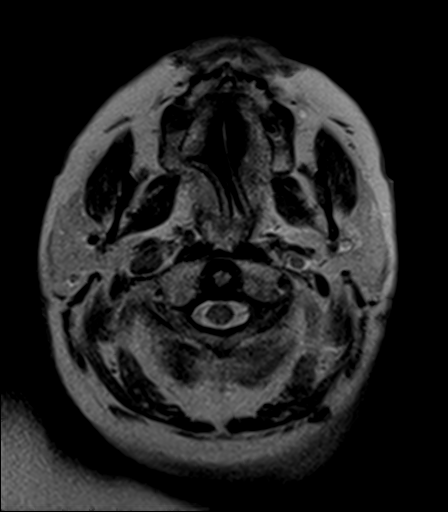
[im 14/27]
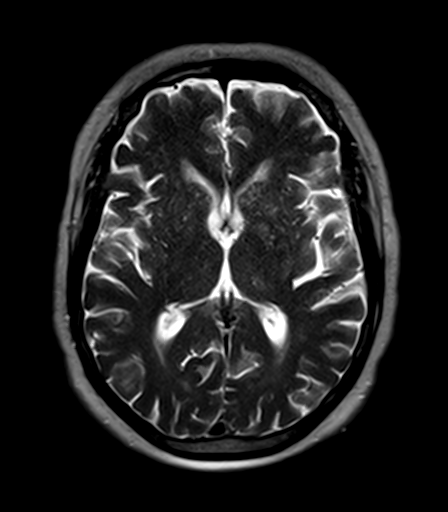
[im 27/27]
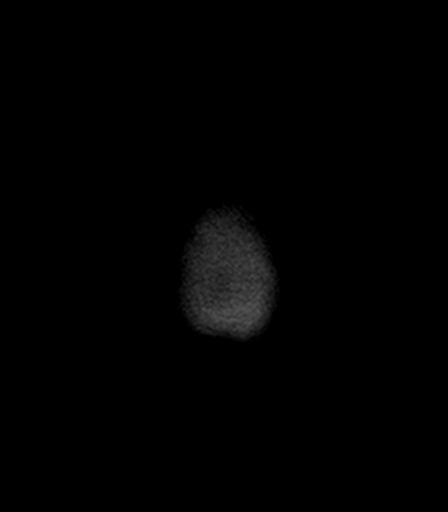

[Series 11: mag_images · axial · 3.0mm · 0.90mm/px · z∈[-86,+54]mm · 5 of 52 slices shown]
[im 1/52]
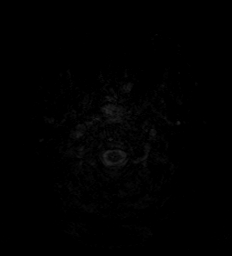
[im 13/52]
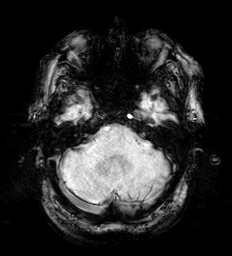
[im 26/52]
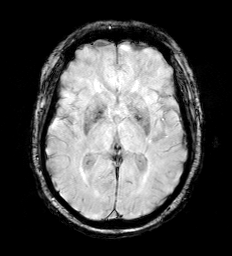
[im 39/52]
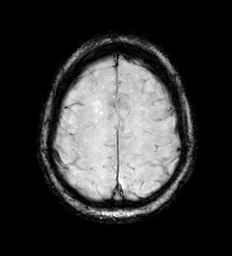
[im 52/52]
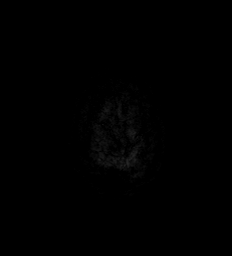

[Series 12: pha_images · axial · 3.0mm · 0.90mm/px · z∈[-86,+54]mm · 5 of 52 slices shown]
[im 1/52]
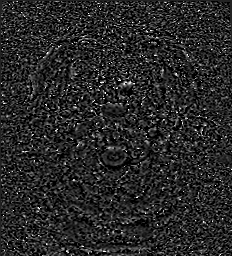
[im 13/52]
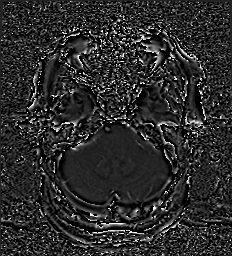
[im 26/52]
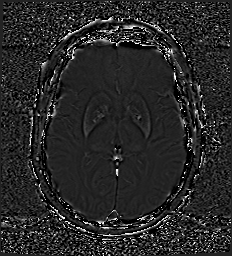
[im 39/52]
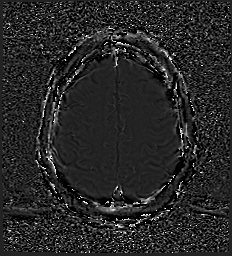
[im 52/52]
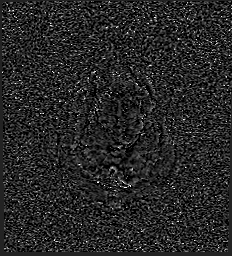

[Series 13: swi_images · axial · 3.0mm · 0.90mm/px · z∈[-86,+54]mm · 5 of 52 slices shown]
[im 1/52]
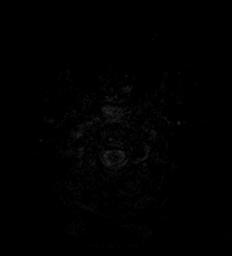
[im 13/52]
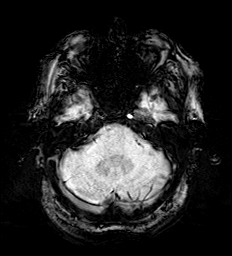
[im 26/52]
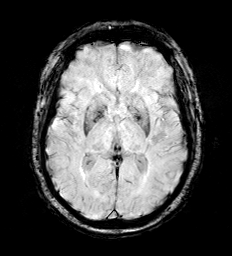
[im 39/52]
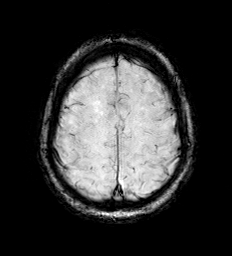
[im 52/52]
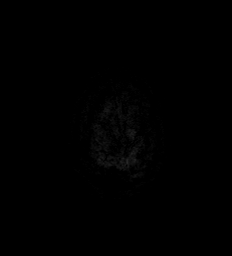

[Series 15: FLAIR · axial · 3.0mm · 0.69mm/px · z∈[-92,+56]mm · 5 of 55 slices shown]
[im 1/55]
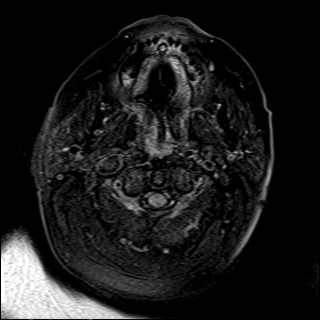
[im 14/55]
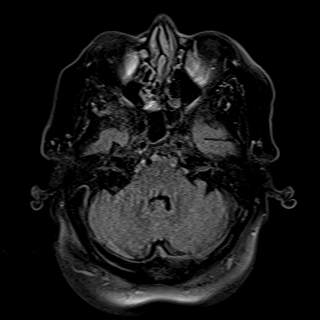
[im 28/55]
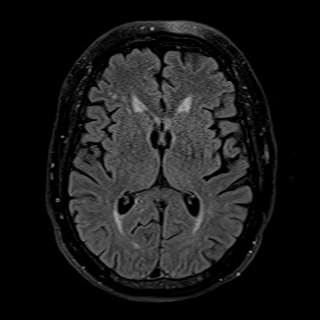
[im 41/55]
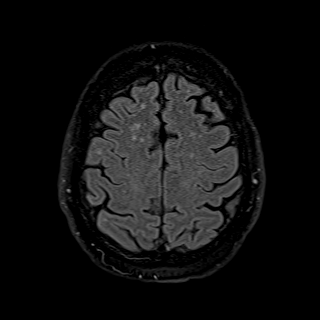
[im 55/55]
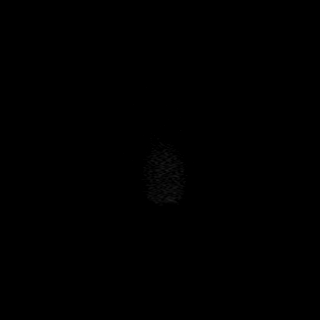

[Series 16: T1 · axial · 5.0mm · 0.90mm/px · z∈[-86,+56]mm · 3 of 27 slices shown (2 of 2)]
[im 1/27]
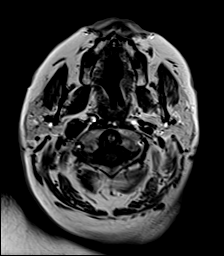
[im 14/27]
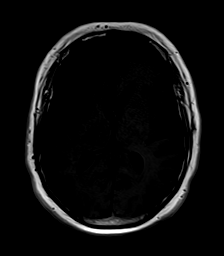
[im 27/27]
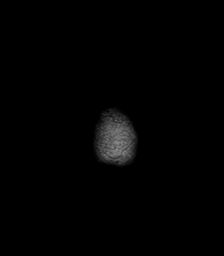

[Series 17: T2 · coronal · 5.0mm · 0.45mm/px · 3 of 31 slices shown (2 of 2)]
[im 1/31]
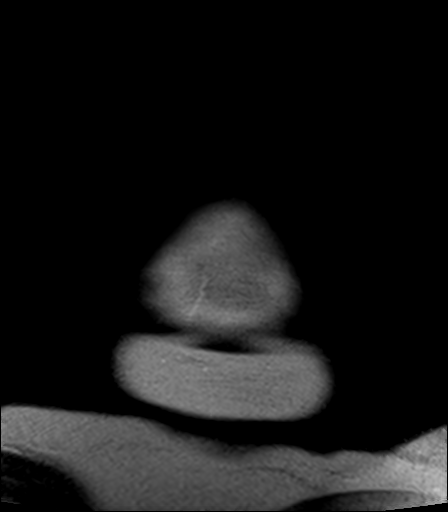
[im 16/31]
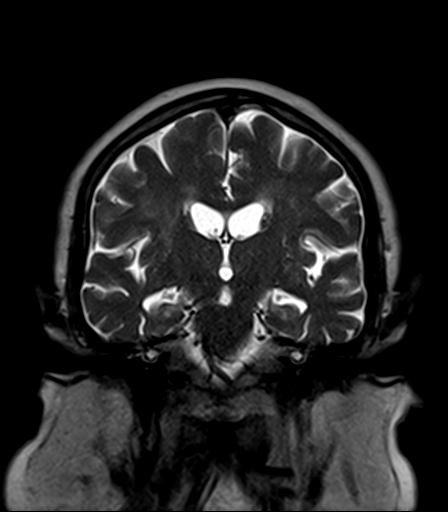
[im 31/31]
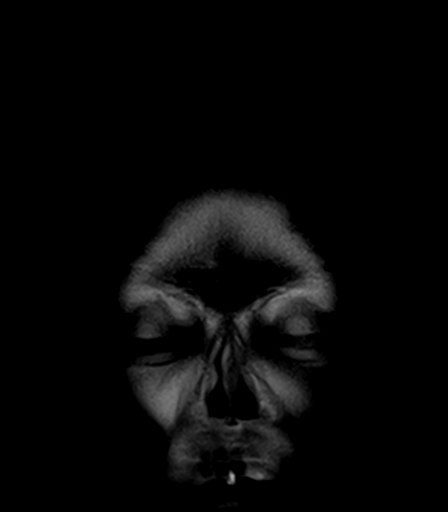

[48 of 48 positions shown; findings below may reference images not displayed]

FINDINGS: Brain: Negative for acute infarct. No evidence of acute anoxic
injury.

Ventricle size normal. Moderate white matter changes with patchy
hyperintensity throughout the white matter. Mild hyperintensity in
the pons. Negative for hemorrhage or mass.

Vascular: Normal arterial flow voids.

Skull and upper cervical spine: Negative

Sinuses/Orbits: Paranasal sinuses clear.

Other: None
IMPRESSION: No acute abnormality.  Negative for acute anoxic brain injury

Moderate chronic microvascular ischemic change in the white matter
and pons.

## 2023-09-09 IMAGING — CT CT ABD-PELV W/O CM
2 of 4 series · 16 of 46 positions shown, 18 images · non-contrast
Comparison: CT examination dated June 10, 2019

CLINICAL DATA: Bowel obstruction suspected.



[Series 2: routine abd/pel wo · axial · 0.98mm/px · z∈[-1346,-811]mm · 13 of 119 slices shown, 15 images]
[im 6/119  soft-tissue]
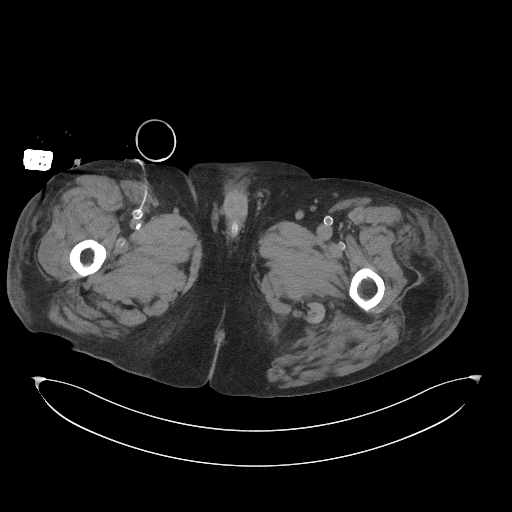
[im 6/119  bone]
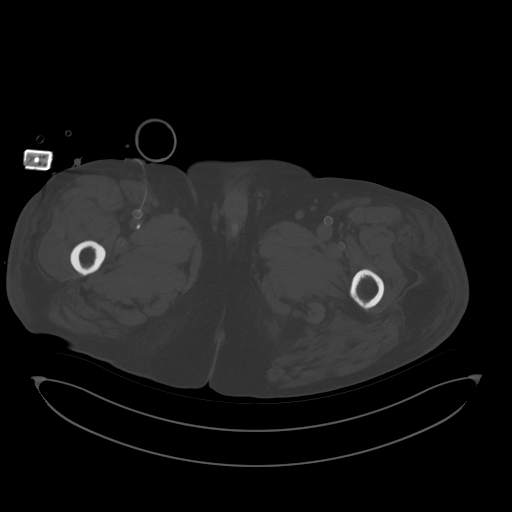
[im 16/119  soft-tissue]
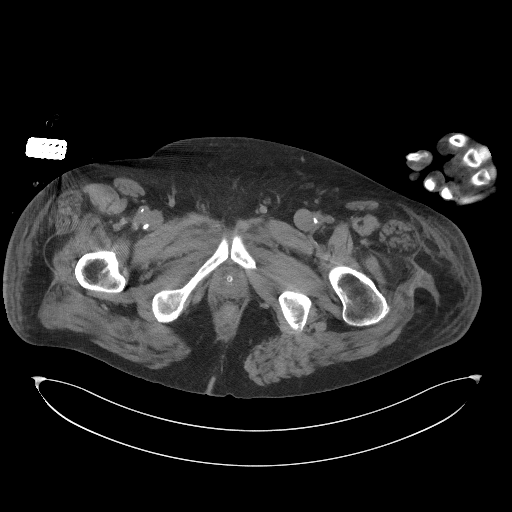
[im 26/119  soft-tissue]
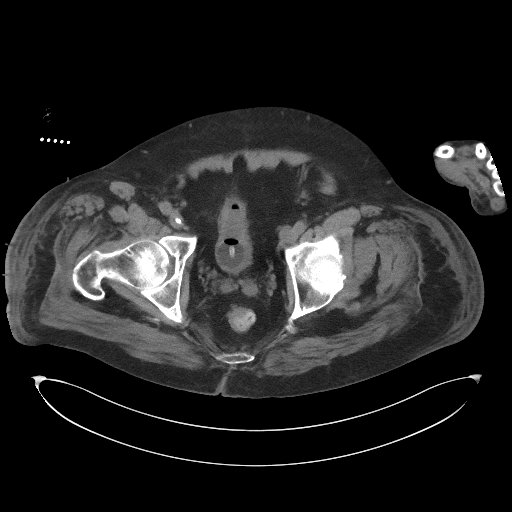
[im 31/119  soft-tissue]
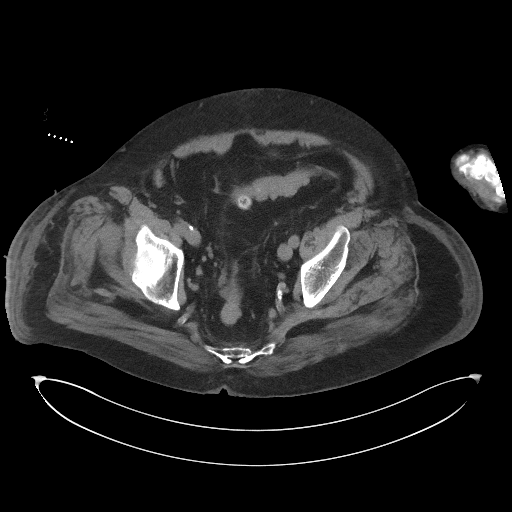
[im 42/119  soft-tissue]
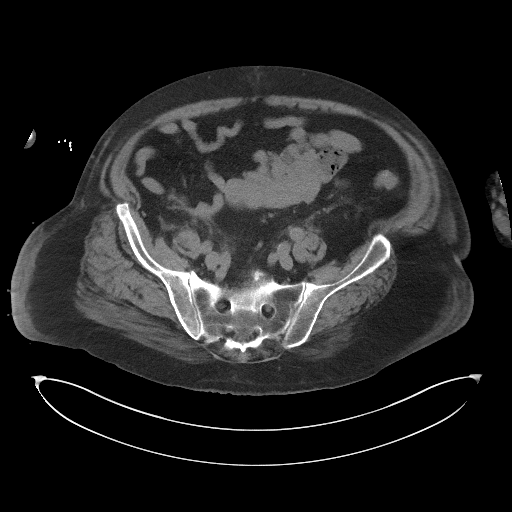
[im 52/119  soft-tissue]
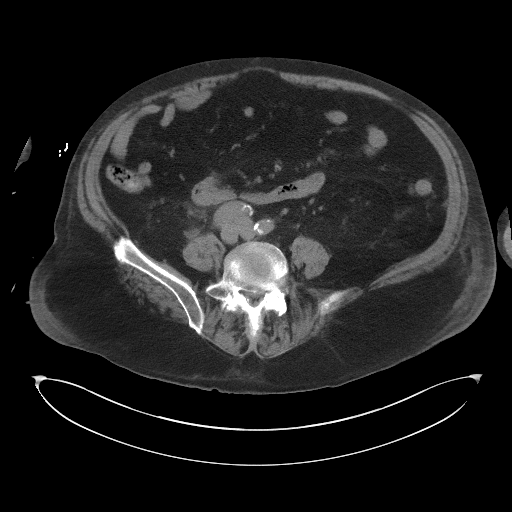
[im 62/119  soft-tissue]
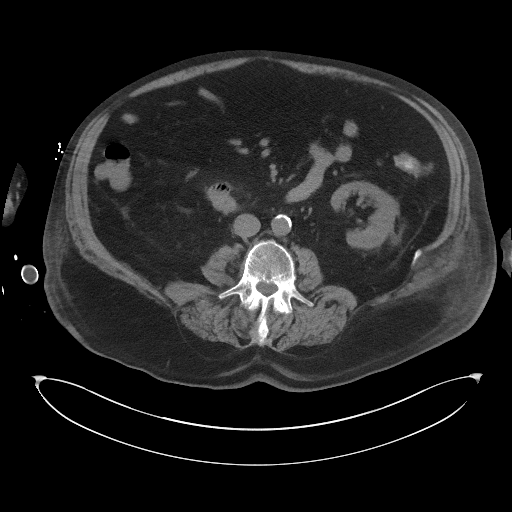
[im 67/119  soft-tissue]
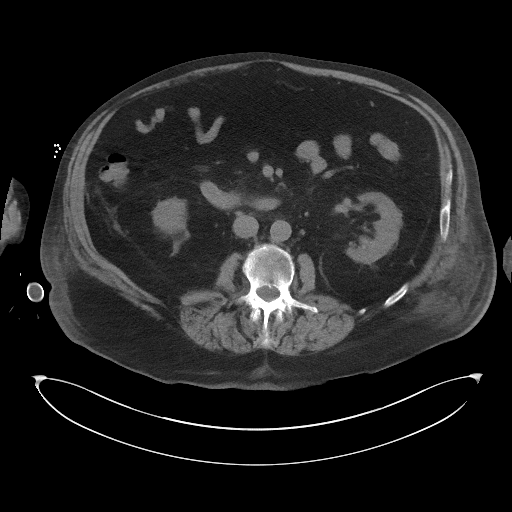
[im 77/119  soft-tissue]
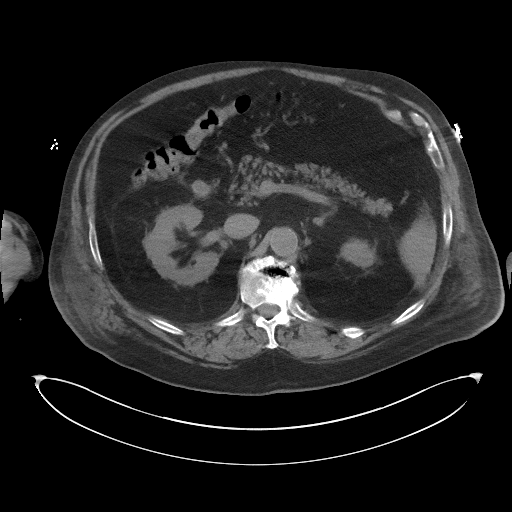
[im 77/119  bone]
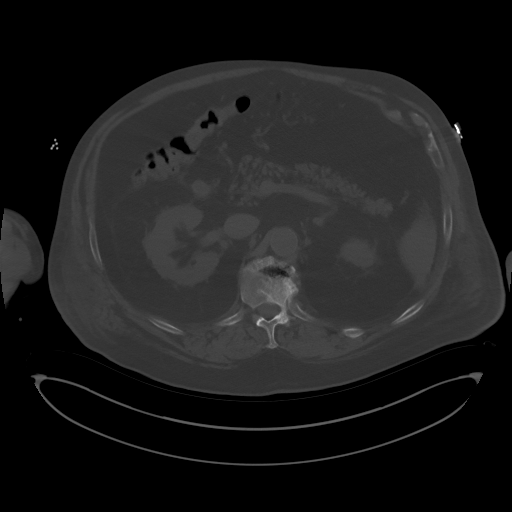
[im 88/119  soft-tissue]
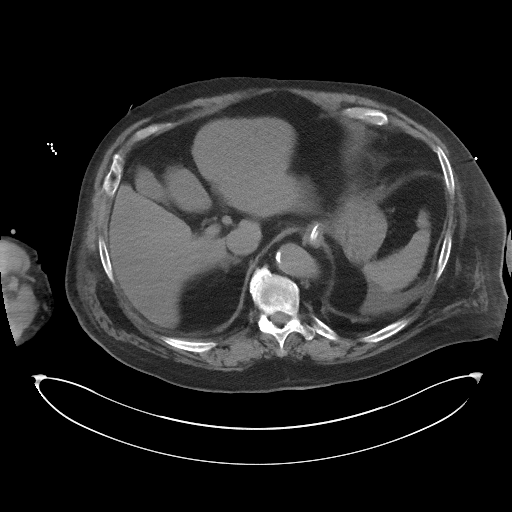
[im 93/119  soft-tissue]
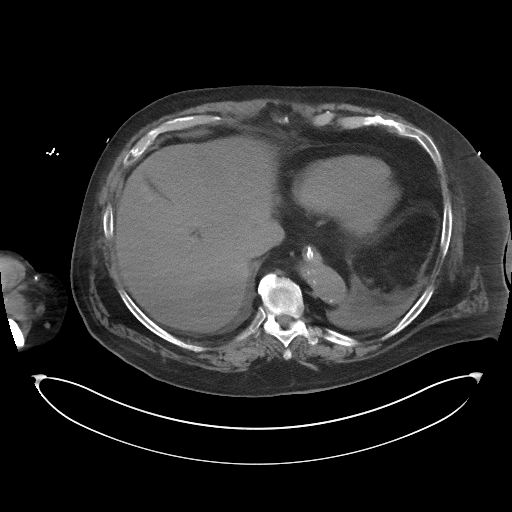
[im 103/119  soft-tissue]
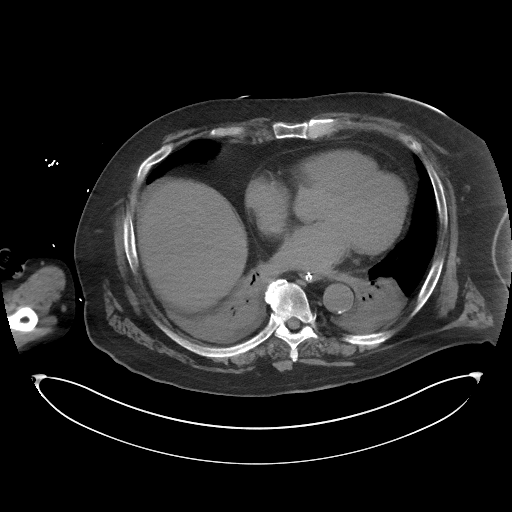
[im 113/119  soft-tissue]
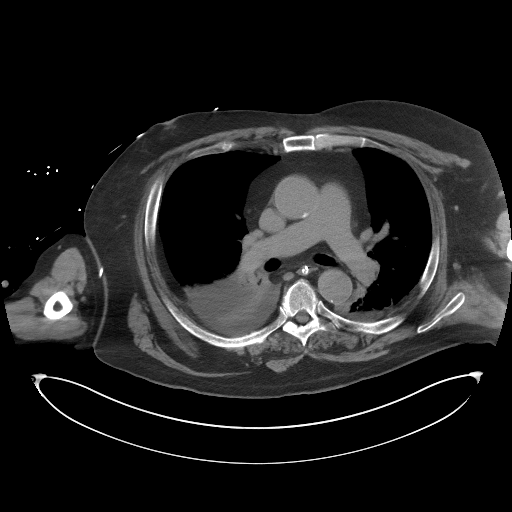

[Series 5: coronal st · coronal · 1.00mm/px · 3 of 111 slices shown]
[im 37/111  soft-tissue]
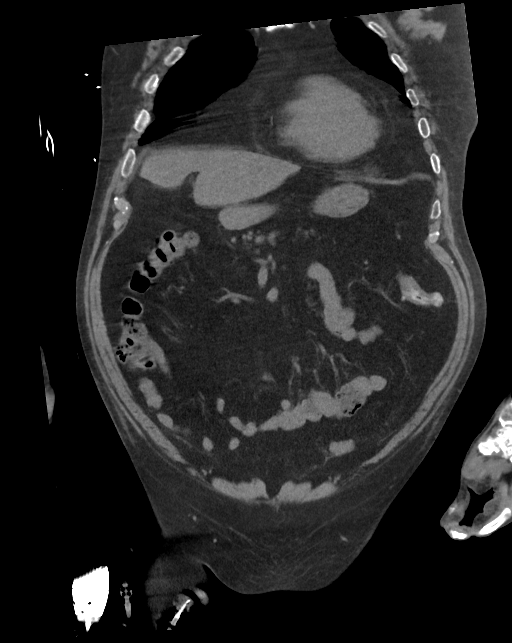
[im 49/111  soft-tissue]
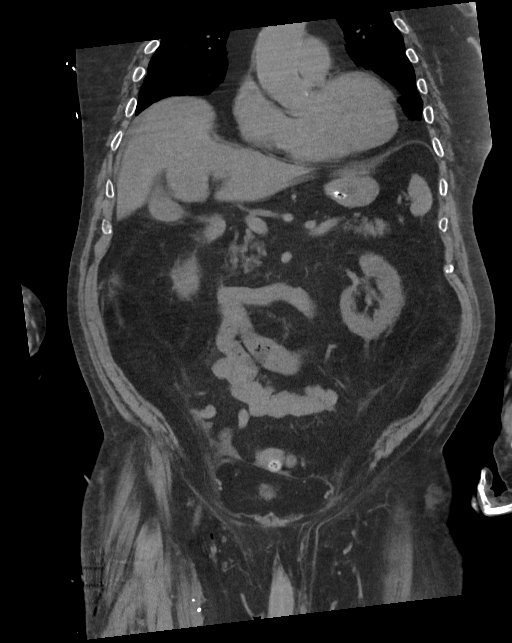
[im 62/111  soft-tissue]
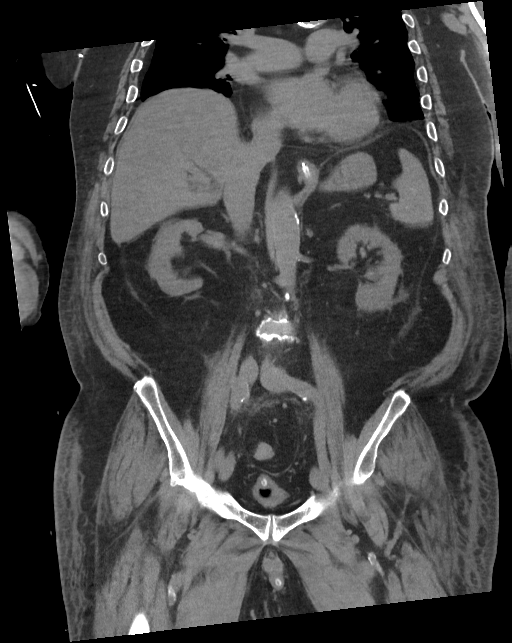

[16 of 46 positions shown; findings below may reference images not displayed]

FINDINGS: Lower chest: Small bilateral pleural effusions with associated
atelectasis, right greater than the left.

Hepatobiliary: No focal liver abnormality is seen. No gallstones,
gallbladder wall thickening, or biliary dilatation.

Pancreas: Moderate pancreatic atrophy. No pancreatic ductal
dilatation or surrounding inflammatory changes.

Spleen: Normal in size without focal abnormality. Calcified
granuloma in the spleen is noted.

Adrenals/Urinary Tract: Adrenal glands are unremarkable. Kidneys are
normal, without renal calculi, focal lesion, or hydronephrosis.
Nonspecific bilateral perinephric fat stranding. Urinary bladder is
collapsed about the Foley's catheter.

Stomach/Bowel: Stomach is within normal limits. NG tube with distal
tip in the body of the stomach. Appendix appears normal. No evidence
of bowel wall thickening, distention, or inflammatory changes.
Scattered colonic diverticuli without evidence of acute
diverticulitis.

Vascular/Lymphatic: Aortic atherosclerosis. No enlarged abdominal or
pelvic lymph nodes. Right femoral access central line.

Reproductive: Prostate is unremarkable.

Other: No abdominal wall hernia or abnormality. No abdominopelvic
ascites. Mild generalized anasarca suggesting fluid overload.

Musculoskeletal: Advanced multilevel level degenerate disc disease
of the thoracolumbar spine no acute osseous abnormality.
IMPRESSION: 1. Bowel loops are normal in caliber. Scattered colonic diverticula
without evidence of acute diverticulitis.

2. Small bilateral pleural effusions with associated atelectasis,
right greater than the left.

3.  Mild generalized anasarca.

4.  No CT evidence of acute abdominal/pelvic process.

5.  Additional chronic findings as above.

## 2023-09-10 IMAGING — DX DG CHEST 1V PORT
1 series · 1 of 1 positions shown · non-contrast
Comparison: 05/03/2022

CLINICAL DATA: Respiratory failure

EXAM:
PORTABLE CHEST 1 VIEW

[chest ap]
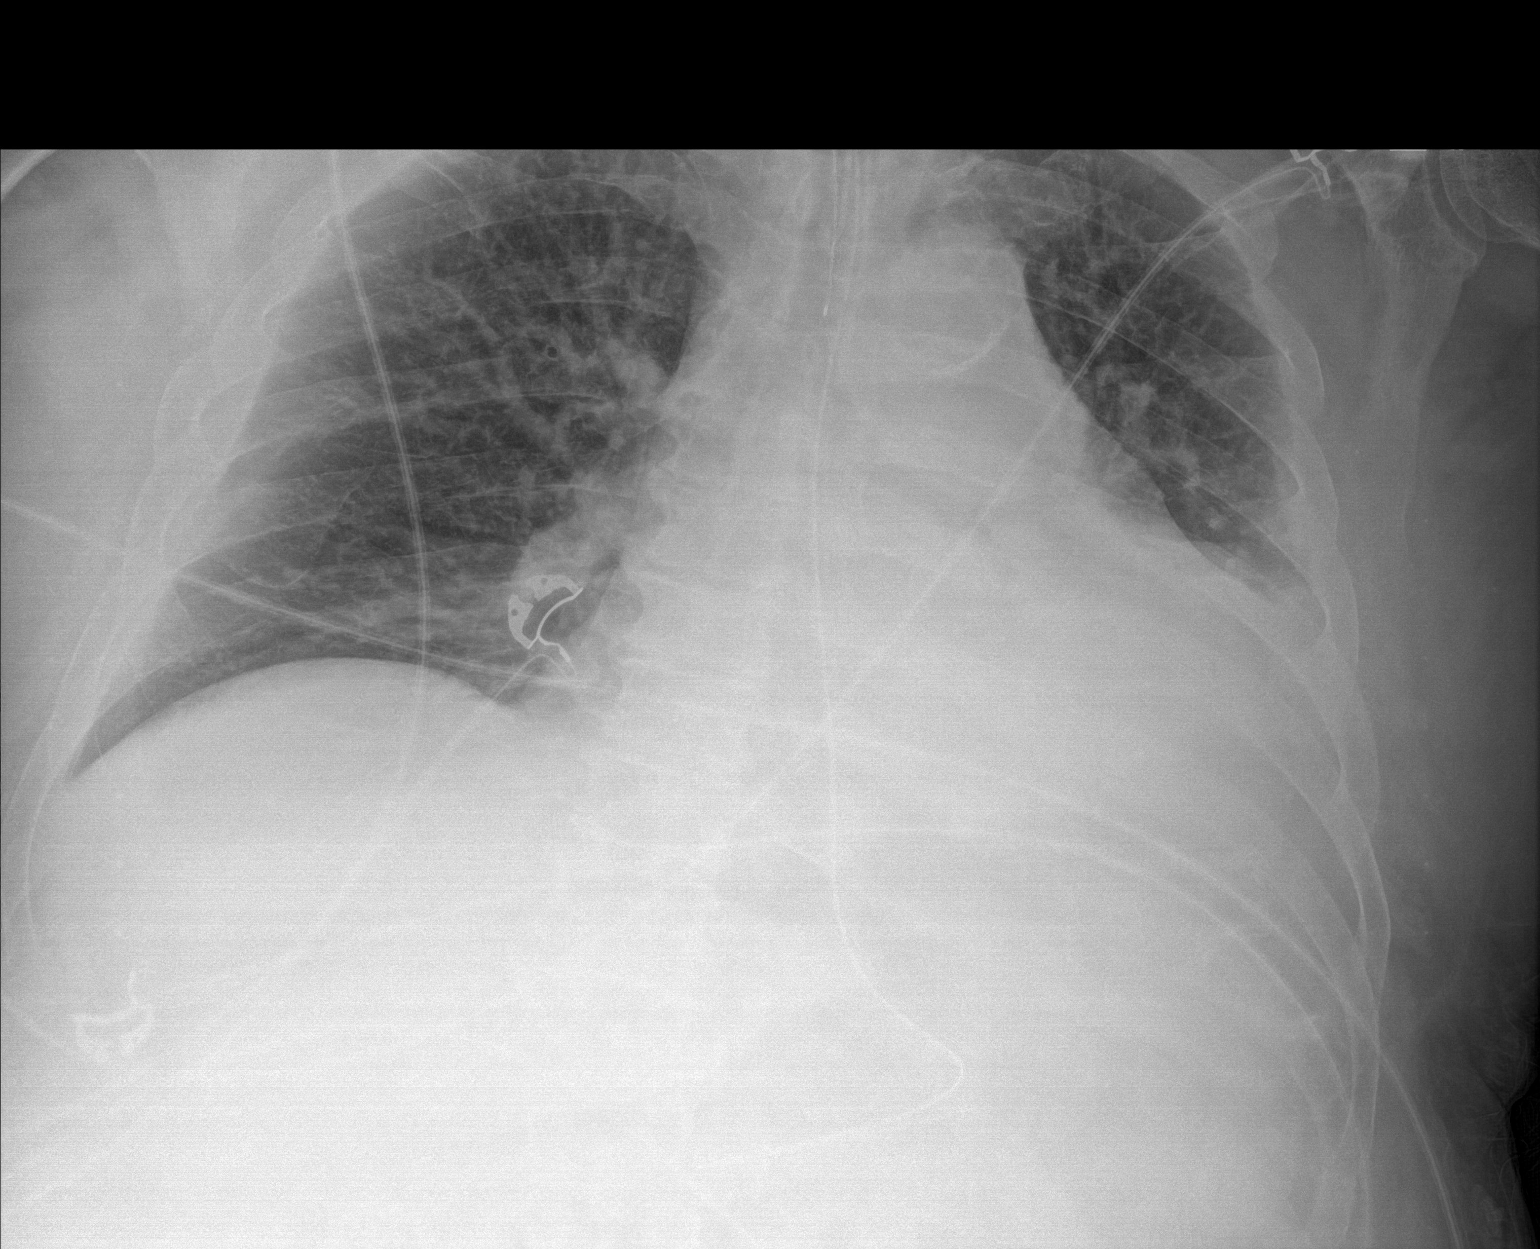

[1 of 1 positions shown; findings below may reference images not displayed]

FINDINGS: Endotracheal tube, enteric tube, and temperature probe are unchanged
in position since the previous study. Shallow inspiration. Cardiac
enlargement without vascular congestion. Improved aeration of the
left lung with mild residual atelectasis or effusion in the lower
left chest. Significant improvement of aeration in the left upper
lung. Right lung is clear and expanded.
IMPRESSION: Appliances appear in satisfactory position. Improved aeration of the
left lung with residual basilar atelectasis and small effusion.

## 2023-09-13 ENCOUNTER — Other Ambulatory Visit: Payer: Self-pay | Admitting: Family Medicine

## 2023-09-14 ENCOUNTER — Ambulatory Visit (INDEPENDENT_AMBULATORY_CARE_PROVIDER_SITE_OTHER): Payer: PPO | Admitting: Family Medicine

## 2023-09-14 ENCOUNTER — Encounter: Payer: Self-pay | Admitting: Family Medicine

## 2023-09-14 VITALS — BP 109/70 | HR 56 | Temp 98.4°F | Ht 67.0 in | Wt 198.0 lb

## 2023-09-14 DIAGNOSIS — R531 Weakness: Secondary | ICD-10-CM | POA: Diagnosis not present

## 2023-09-14 DIAGNOSIS — G8929 Other chronic pain: Secondary | ICD-10-CM

## 2023-09-14 DIAGNOSIS — M791 Myalgia, unspecified site: Secondary | ICD-10-CM | POA: Diagnosis not present

## 2023-09-14 DIAGNOSIS — F419 Anxiety disorder, unspecified: Secondary | ICD-10-CM | POA: Diagnosis not present

## 2023-09-14 DIAGNOSIS — I5032 Chronic diastolic (congestive) heart failure: Secondary | ICD-10-CM

## 2023-09-14 DIAGNOSIS — M2550 Pain in unspecified joint: Secondary | ICD-10-CM

## 2023-09-14 DIAGNOSIS — Z86711 Personal history of pulmonary embolism: Secondary | ICD-10-CM

## 2023-09-14 MED ORDER — OMEPRAZOLE 40 MG PO CPDR: 40.0000 mg | DELAYED_RELEASE_CAPSULE | Freq: Two times a day (BID) | ORAL | 3 refills | Status: DC

## 2023-09-14 MED ORDER — OLANZAPINE 5 MG PO TBDP: 5.0000 mg | ORAL_TABLET | Freq: Two times a day (BID) | ORAL | 3 refills | Status: DC

## 2023-09-14 MED ORDER — HYDROCODONE-ACETAMINOPHEN 7.5-325 MG PO TABS
1.0000 | ORAL_TABLET | Freq: Four times a day (QID) | ORAL | 0 refills | Status: DC | PRN
Start: 2023-09-14 — End: 2023-10-18

## 2023-09-14 NOTE — Patient Instructions (Signed)
.   Please review the attached list of medications and notify my office if there are any errors.   . Please bring all of your medications to every appointment so we can make sure that our medication list is the same as yours.   

## 2023-09-14 NOTE — Progress Notes (Signed)
Established patient visit   Patient: Joseph Macdonald   DOB: [birth year]   77 y.o. Male  MRN: [id number] Visit Date: 09/14/2023  Today's healthcare provider: Mila Merry, MD   Chief Complaint  Patient presents with   Leg Pain    Patient complains of bilateral leg pain and generalized weakness.  He said the pain gets worse as the day goes on.     Diabetes   Subjective    Discussed the use of AI scribe software for clinical note transcription with the patient, who gave verbal consent to proceed.  History of Present Illness   The patient presents with chronic, gradually worsening pain in the lower extremities, particularly the right leg, described as an aching sensation akin to a toothache. The pain is severe enough to disrupt sleep, with the patient waking up two to three times a night. Current pain management with hydrocodone only dulls the sharpness of the pain but does not provide sufficient relief for rest or sleep. He has been taking 5mg  oxycodone for several years which had been effective, but is not controlling his chronic pain anymore.   In addition to the leg pain, the patient reports generalized weakness. The patient's blood pressure was noted to be extremely low [facility] prompting further lab work. The results of this lab work are currently unknown. The patient has lost almost forty pounds recently, which may be affecting medication dosages.  The patient has been taking omeprazole for reflux and pramipexole for restless leg syndrome, but the latter does not seem to be providing relief. The patient also reports a lack of appetite.       Medications: Outpatient Medications Prior to Visit  Medication Sig   albuterol (PROVENTIL HFA;VENTOLIN HFA) 108 (90 Base) MCG/ACT inhaler Inhale into the lungs every 6 (six) hours as needed for wheezing or shortness of breath.   apixaban (ELIQUIS) 5 MG TABS tablet Take 5 mg by mouth 2 (two) times  daily.   cholecalciferol (VITAMIN D) 25 MCG (1000 UNIT) tablet Take 2,000 Units by mouth daily.   digoxin (LANOXIN) 0.125 MG tablet Take 1 tablet (0.125 mg total) by mouth daily.   empagliflozin (JARDIANCE) 25 MG TABS tablet Take 25 mg by mouth daily.   famotidine (PEPCID) 20 MG tablet Take 20 mg by mouth as needed.   HYDROcodone-acetaminophen (NORCO/VICODIN) 5-325 MG tablet Take 1 tablet by mouth every 6 (six) hours as needed for moderate pain.   metFORMIN (GLUCOPHAGE) 500 MG tablet Take 500 mg by mouth 2 (two) times daily with a meal.   metoprolol succinate (TOPROL-XL) 25 MG 24 hr tablet Take 25 mg by mouth daily.   pravastatin (PRAVACHOL) 20 MG tablet Take 20 mg by mouth daily.   predniSONE (DELTASONE) 5 MG tablet Take 15 mg by mouth as directed.   pyridostigmine (MESTINON) 60 MG tablet Take 1 tablet (60 mg total) by mouth 3 (three) times daily.   sacubitril-valsartan (ENTRESTO) 24-26 MG Take 1 tablet (24/26 mg) by mouth twice daily   spironolactone (ALDACTONE) 25 MG tablet Take 25 mg by mouth daily.   tiotropium (SPIRIVA) 18 MCG inhalation capsule Place 18 mcg into inhaler and inhale daily.   vitamin B-12 (CYANOCOBALAMIN) 1000 MCG tablet Take 1,000 mcg by mouth daily.   [DISCONTINUED] OLANZapine zydis (ZYPREXA) 5 MG disintegrating tablet ALLOW ONE TABLET TO DISSOLVE IN THE MOUTH TWICE DAILY   [DISCONTINUED] omeprazole (PRILOSEC) 40 MG capsule Take 40 mg by  mouth 2 (two) times daily.    budesonide-formoterol (SYMBICORT) 160-4.5 MCG/ACT inhaler Inhale 2 puffs into the lungs 2 (two) times daily.   diazepam (VALIUM) 5 MG tablet Take 5 mg by mouth every 6 (six) hours as needed for anxiety.   pramipexole (MIRAPEX) 0.5 MG tablet TAKE 1 TABLET BY MOUTH AT BEDTIME   promethazine (PHENERGAN) 12.5 MG tablet Take 12.5 mg by mouth every 4 (four) hours as needed.   terbinafine (LAMISIL) 250 MG tablet Take 250 mg by mouth daily.   torsemide (DEMADEX) 20 MG tablet Take 1 tablet (20 mg total) by mouth  daily. You may take an additional tablet as needed for abnkle swelling in addition to your normal dose.   No facility-administered medications prior to visit.   Review of Systems     Objective    BP 109/70 (BP Location: Left Arm, Patient Position: Sitting, Cuff Size: Normal)   Pulse (!) 56   Temp 98.4 F (36.9 C) (Oral)   Ht 5\' 7"  (1.702 m)   Wt 198 lb (89.8 kg)   SpO2 95%   BMI 31.01 kg/m   Physical Exam   General: Appearance:    Mildly obese male in no acute distress  Eyes:    PERRL, conjunctiva/corneas clear, EOM's intact       Lungs:     Clear to auscultation bilaterally, respirations unlabored  Heart:    Bradycardic. Regular rhythm. No murmurs, rubs, or gallops.    MS:   All extremities are intact.    Neurologic:   Awake, alert, oriented x 3. No apparent focal neurological defect.         Assessment & Plan        Myalgia/arthralgia Severe, aching pain in both legs, worse on the right, not adequately controlled with current hydrocodone regimen. Pain is affecting sleep and quality of life. -Increase hydrocodone dose and send new prescription to [facility]. -Order CK, sed rate, ANA Patient to have copy of recent labs from [place] sent to me for review.   Restless Leg Syndrome Current treatment with pramipexole not providing relief. -Hold off on refilling pramipexole until lab results are reviewed.  GERD On omeprazole for reflux. -Refill omeprazole prescription at [facility].    No follow-ups on file.      Mila Merry, MD  [facility] [phone] (phone) [phone] (fax)  [facility]

## 2023-09-15 LAB — CK: Total CK: 32 U/L — ABNORMAL LOW (ref 41–331)

## 2023-09-15 LAB — SEDIMENTATION RATE: Sed Rate: 4 mm/h (ref 0–30)

## 2023-09-15 LAB — ANA W/REFLEX IF POSITIVE: Anti Nuclear Antibody (ANA): NEGATIVE

## 2023-09-15 LAB — MAGNESIUM: Magnesium: 1.9 mg/dL (ref 1.6–2.3)

## 2023-09-20 NOTE — Progress Notes (Signed)
Cardiology Office Note  Date:  09/21/2023   ID:  Joseph Macdonald, DOB September 23, 1946, MRN [id number]  PCP:  Malva Limes, MD   Chief Complaint  Patient presents with   6 month follow up     Patient c/o legs ache daily, weakness & dizziness. Medications reviewed by the patient verbally.     HPI:  Joseph Macdonald is a 77 y.o. male with history of  sustained VT,  myasthenia gravis with myasthenia crisis requiring plasma exchange at [facility] in 12/2018, June 2023 HFpEF,  HTN,  smoking, COPD, on oxygen HLD  HCM CAd: 60% LAD  June 2023 June 23: Ejection fraction estimated 35 to 40% on echo Echocardiogram October 2023 EF 55% who presents for follow up of HFpEF, atrial fibrillation on Eliquis, cardiomyopathy, acute on chronic respiratory failure  Last seen by myself in the clinic 2024  Off oxygen on today's visit Dramatic weight loss over the past several months Off torsemide, no edema, was taking QOD Feels weak, tired Seen at the [place], stopped entresto/jardiance hoping this would help his symptoms, blood pressure was also low  Wife who presents with him today reports that he has poor sleep hygiene  sleeps 2 hrs, waking often on all night Long work hx of waking 3 AM, they thought he would be able to sleeping better but has not seem to workout  Reports having orthostasis type symptoms Blood pressure low today 110 systolic Walks with a walker, no recent falls  Low heart rate today 51 bpm  Biggest complaint today is GERD issues, has completed EGD/colo December 2023 with Dr. Servando Snare, no significant pathology noted Difficulty eating pieces of meat and chicken, has to wash them down, then feels full  EKG personally reviewed by myself on todays visit EKG Interpretation Date/Time:  Friday September 21 2023 11:47:47 EDT Ventricular Rate:  51 PR Interval:  200 QRS Duration:  94 QT Interval:  426 QTC Calculation: 392 R Axis:   -28  Text Interpretation: Sinus bradycardia with Premature  atrial complexes Minimal voltage criteria for LVH, may be normal variant ( R in aVL ) When compared with ECG of 20-Nov-2022 19:53, Significant changes have occurred Confirmed by Julien Nordmann [phone]) on 09/21/2023 12:04:49 PM   Other past medical history reviewed June 2023 long hospitalization cardiac arrest secondary to respiratory failure pneumonia, cardiomyopathy, acute on chronic systolic CHF Ejection fraction estimated 35 to 40% on echo Treated with aggressive diuresis  Cardiac catheterization May 16, 2022 Moderate LAD disease, 60% lesion no intervention performed Moderate pulmonary hypertension  Profound weakness during his hospitalization concerning for myasthenia gravis flare, treated with IVIG  Was discharged to [facility]  Last medical history reviewed [facility]  12/2018 for marked weakness consistent with myasthenia crisis. He underwent plasma exchange and was noted to have episodes of NSVT.    PMH:   has a past medical history of Anxiety, Arthritis, Cardiac arrest due to respiratory disorder (HCC) (04/30/2022), CHF (congestive heart failure) (HCC), COPD (chronic obstructive pulmonary disease) (HCC), Diabetes mellitus without complication (HCC), Dyspnea, Hyperlipidemia, Hypertension, Myasthenia gravis (HCC), Oxygen deficit, Polyp of ascending colon (11/30/2022), and Ventricular tachycardia (HCC).  PSH:    Past Surgical History:  Procedure Laterality Date   carpel tunnel sx     CATARACT EXTRACTION     right eye   CATARACT EXTRACTION W/PHACO Left 12/01/2020   Procedure: CATARACT EXTRACTION PHACO AND INTRAOCULAR LENS PLACEMENT (IOC) LEFT 10.83 01:14.1 14.6%;  Surgeon: Lockie Mola, MD;  Location: [facility];  Service: Ophthalmology;  Laterality: Left;   COLONOSCOPY WITH PROPOFOL N/A 11/30/2022   Procedure: COLONOSCOPY WITH PROPOFOL;  Surgeon: Midge Minium, MD;  Location: [facility] ENDOSCOPY;  Service: Endoscopy;  Laterality: N/A;   CORONARY PRESSURE/FFR STUDY N/A  05/16/2022   Procedure: INTRAVASCULAR PRESSURE WIRE/FFR STUDY;  Surgeon: Yvonne Kendall, MD;  Location: [facility] INVASIVE CV LAB;  Service: Cardiovascular;  Laterality: N/A;   ESOPHAGOGASTRODUODENOSCOPY N/A 11/30/2022   Procedure: ESOPHAGOGASTRODUODENOSCOPY (EGD);  Surgeon: Midge Minium, MD;  Location: [facility] ENDOSCOPY;  Service: Endoscopy;  Laterality: N/A;   EYE SURGERY     RETINAL DETACHMENT SURGERY     RIGHT/LEFT HEART CATH AND CORONARY ANGIOGRAPHY N/A 05/16/2022   Procedure: RIGHT/LEFT HEART CATH AND CORONARY ANGIOGRAPHY;  Surgeon: Yvonne Kendall, MD;  Location: [facility] INVASIVE CV LAB;  Service: Cardiovascular;  Laterality: N/A;   SKIN GRAFT     Behind left knee   TONSILLECTOMY AND ADENOIDECTOMY      Current Outpatient Medications  Medication Sig Dispense Refill   albuterol (PROVENTIL HFA;VENTOLIN HFA) 108 (90 Base) MCG/ACT inhaler Inhale into the lungs every 6 (six) hours as needed for wheezing or shortness of breath.     apixaban (ELIQUIS) 5 MG TABS tablet Take 5 mg by mouth 2 (two) times daily.     cholecalciferol (VITAMIN D) 25 MCG (1000 UNIT) tablet Take 2,000 Units by mouth daily.     digoxin (LANOXIN) 0.125 MG tablet Take 1 tablet (0.125 mg total) by mouth daily.     HYDROcodone-acetaminophen (NORCO) 7.5-325 MG tablet Take 1 tablet by mouth every 6 (six) hours as needed for moderate pain. 120 tablet 0   metFORMIN (GLUCOPHAGE) 500 MG tablet Take 500 mg by mouth 2 (two) times daily with a meal.     metoprolol succinate (TOPROL-XL) 25 MG 24 hr tablet Take 25 mg by mouth daily.     omeprazole (PRILOSEC) 40 MG capsule Take 1 capsule (40 mg total) by mouth 2 (two) times daily. 60 capsule 3   predniSONE (DELTASONE) 5 MG tablet Take 10 mg by mouth as directed.     promethazine (PHENERGAN) 12.5 MG tablet Take 12.5 mg by mouth every 4 (four) hours as needed.     pyridostigmine (MESTINON) 60 MG tablet Take 1 tablet (60 mg total) by mouth 3 (three) times daily. 90 tablet 3   spironolactone  (ALDACTONE) 25 MG tablet Take 25 mg by mouth daily.     tiotropium (SPIRIVA) 18 MCG inhalation capsule Place 18 mcg into inhaler and inhale daily.     vitamin B-12 (CYANOCOBALAMIN) 1000 MCG tablet Take 1,000 mcg by mouth daily.     budesonide-formoterol (SYMBICORT) 160-4.5 MCG/ACT inhaler Inhale 2 puffs into the lungs 2 (two) times daily. (Patient not taking: Reported on 09/21/2023) 1 Inhaler 0   diazepam (VALIUM) 5 MG tablet Take 5 mg by mouth every 6 (six) hours as needed for anxiety. (Patient not taking: Reported on 09/21/2023)     empagliflozin (JARDIANCE) 25 MG TABS tablet Take 25 mg by mouth daily. (Patient not taking: Reported on 09/21/2023)     famotidine (PEPCID) 20 MG tablet Take 20 mg by mouth as needed. (Patient not taking: Reported on 09/21/2023)     HYDROcodone-acetaminophen (NORCO/VICODIN) 5-325 MG tablet Take 1 tablet by mouth every 6 (six) hours as needed for moderate pain. (Patient not taking: Reported on 09/21/2023) 120 tablet 0   OLANZapine zydis (ZYPREXA) 5 MG disintegrating tablet Take 1 tablet (5 mg total) by mouth 2 (two) times daily. (Patient not taking: Reported on 09/21/2023)  60 tablet 3   pramipexole (MIRAPEX) 0.5 MG tablet TAKE 1 TABLET BY MOUTH AT BEDTIME (Patient not taking: Reported on 09/21/2023) 90 tablet 3   pravastatin (PRAVACHOL) 20 MG tablet Take 20 mg by mouth daily.     terbinafine (LAMISIL) 250 MG tablet Take 250 mg by mouth daily. (Patient not taking: Reported on 09/21/2023)     No current facility-administered medications for this visit.    Allergies:   Lisinopril   Social History:  The patient  reports that he quit smoking about 6 years ago. His smoking use included pipe and cigarettes. He started smoking about 46 years ago. He has a 20 pack-year smoking history. He quit smokeless tobacco use about 6 years ago. He reports current alcohol use. He reports that he does not use drugs.   Family History:   family history includes Arthritis in his father,  mother, and another family member; Heart attack in his father; Heart disease (age of onset: 71) in his father; Hypertension in his mother; Kidney disease in his mother; Stroke in his father; Thyroid disease in his mother.   Review of Systems: Review of Systems  Constitutional: Negative.   HENT: Negative.    Respiratory: Negative.    Cardiovascular: Negative.   Gastrointestinal: Negative.   Musculoskeletal: Negative.   Neurological: Negative.   Psychiatric/Behavioral: Negative.    All other systems reviewed and are negative.  PHYSICAL EXAM: VS:  BP (!) 110/52 (BP Location: Left Arm, Patient Position: Sitting, Cuff Size: Normal)   Pulse (!) 51   Ht 5\' 7"  (1.702 m)   Wt 195 lb 8 oz (88.7 kg)   SpO2 93%   BMI 30.62 kg/m  , BMI Body mass index is 30.62 kg/m. Constitutional:  oriented to person, place, and time. No distress.  HENT:  Head: Grossly normal Eyes:  no discharge. No scleral icterus.  Neck: No JVD, no carotid bruits  Cardiovascular: Regular rate and rhythm, no murmurs appreciated Pulmonary/Chest: Clear to auscultation bilaterally, no wheezes or rails Abdominal: Soft.  no distension.  no tenderness.  Musculoskeletal: Normal range of motion Neurological:  normal muscle tone. Coordination normal. No atrophy Skin: Skin warm and dry Psychiatric: normal affect, pleasant  Recent Labs: 11/20/2022: ALT 22; BUN 23; Creatinine, Ser 1.31; Hemoglobin 16.8; Platelets 203; Potassium 3.9; Sodium 139 09/14/2023: Magnesium 1.9    Lipid Panel Lab Results  Component Value Date   CHOL 141 04/03/2022   HDL 62 04/03/2022   LDLCALC 62 04/03/2022   TRIG 92 04/03/2022  Home  Wt Readings from Last 3 Encounters:  09/21/23 195 lb 8 oz (88.7 kg)  09/14/23 198 lb (89.8 kg)  02/20/23 214 lb (97.1 kg)     ASSESSMENT AND PLAN:  Problem List Items Addressed This Visit       Cardiology Problems   Chronic heart failure with preserved ejection fraction (HFpEF) (HCC) - Primary   Relevant  Orders   EKG 12-Lead (Completed)     Other   Morbid obesity (HCC) (Chronic)   COPD (chronic obstructive pulmonary disease) (HCC)   Relevant Orders   EKG 12-Lead (Completed)   Myasthenia gravis (HCC)   Type 2 diabetes mellitus (HCC)   Other Visit Diagnoses     Essential hypertension       Relevant Orders   EKG 12-Lead (Completed)   Lymphedema       Ventricular tachycardia (HCC)       Relevant Orders   EKG 12-Lead (Completed)   Hyperlipidemia, unspecified hyperlipidemia type  Hypertrophic cardiomyopathy (HCC)       Relevant Orders   EKG 12-Lead (Completed)      Chronic respiratory distress Long history of smoking, underlying COPD Muscle weakness from myasthenia gravis Prior hospitalization for acute on chronic diastolic and systolic CHF in the setting of COPD exacerbation requiring intubation -Pulmonary hypertension on catheterization On today's visit reports breathing improved, no leg swelling, abdominal distention down, off torsemide -Off Jardiance and Entresto per cardiology at the [facility], [place] Blood pressure running low, bradycardic rate 51, some orthostasis symptoms Recommended he decrease metoprolol succinate down to 12.5 daily from twice daily, decrease spironolactone down to 12.5 daily  Shortness of breath/chronic diastolic and systolic CHF Exacerbated by morbid obesity, COPD, myasthenia gravis, cardiomyopathy Poor sleep, deconditioned On chronic prednisone Prior history of pulmonary hypertension in the setting of severe lung disease Off torsemide, appears euvolemic on today's visit  Morbid obesity Eating less, wife reports generalized anorexia  Myasthenia gravis Remains on prednisone Prior treatment IVIG in the hospital  Sustained VT Followed by Dr. Graciela Husbands  Paroxysmal atrial fibrillation On Eliquis Maintaining normal sinus rhythm Continue metoprolol succinate 12.5 daily given bradycardia rate 51 with fatigue symptoms  Leg pain Managed by Dr.  Sherrie Mustache Neuropathy  GERD On omeprazole daily  Fatigue Poor sleep hygiene among other medical issues as detailed above    Signed, Dossie Arbour, M.D., Ph.D. [facility] [place], [place] [phone]

## 2023-09-21 ENCOUNTER — Encounter: Payer: Self-pay | Admitting: Cardiovascular Disease

## 2023-09-21 ENCOUNTER — Ambulatory Visit: Payer: PPO | Attending: Cardiovascular Disease | Admitting: Cardiovascular Disease

## 2023-09-21 VITALS — BP 110/52 | HR 51 | Ht 67.0 in | Wt 195.5 lb

## 2023-09-21 DIAGNOSIS — E785 Hyperlipidemia, unspecified: Secondary | ICD-10-CM

## 2023-09-21 DIAGNOSIS — G7000 Myasthenia gravis without (acute) exacerbation: Secondary | ICD-10-CM | POA: Diagnosis not present

## 2023-09-21 DIAGNOSIS — E119 Type 2 diabetes mellitus without complications: Secondary | ICD-10-CM | POA: Diagnosis not present

## 2023-09-21 DIAGNOSIS — J449 Chronic obstructive pulmonary disease, unspecified: Secondary | ICD-10-CM

## 2023-09-21 DIAGNOSIS — I422 Other hypertrophic cardiomyopathy: Secondary | ICD-10-CM | POA: Diagnosis not present

## 2023-09-21 DIAGNOSIS — I5032 Chronic diastolic (congestive) heart failure: Secondary | ICD-10-CM

## 2023-09-21 DIAGNOSIS — I472 Ventricular tachycardia, unspecified: Secondary | ICD-10-CM | POA: Diagnosis not present

## 2023-09-21 DIAGNOSIS — I10 Essential (primary) hypertension: Secondary | ICD-10-CM | POA: Diagnosis not present

## 2023-09-21 DIAGNOSIS — I890 Lymphedema, not elsewhere classified: Secondary | ICD-10-CM | POA: Diagnosis not present

## 2023-09-21 IMAGING — DX DG KNEE 1-2V*L*
2 series · 2 of 2 positions shown · non-contrast
Comparison: None Available.

CLINICAL DATA: Left knee pain without injury

EXAM:
LEFT KNEE - 1-2 VIEW

[knee ap]
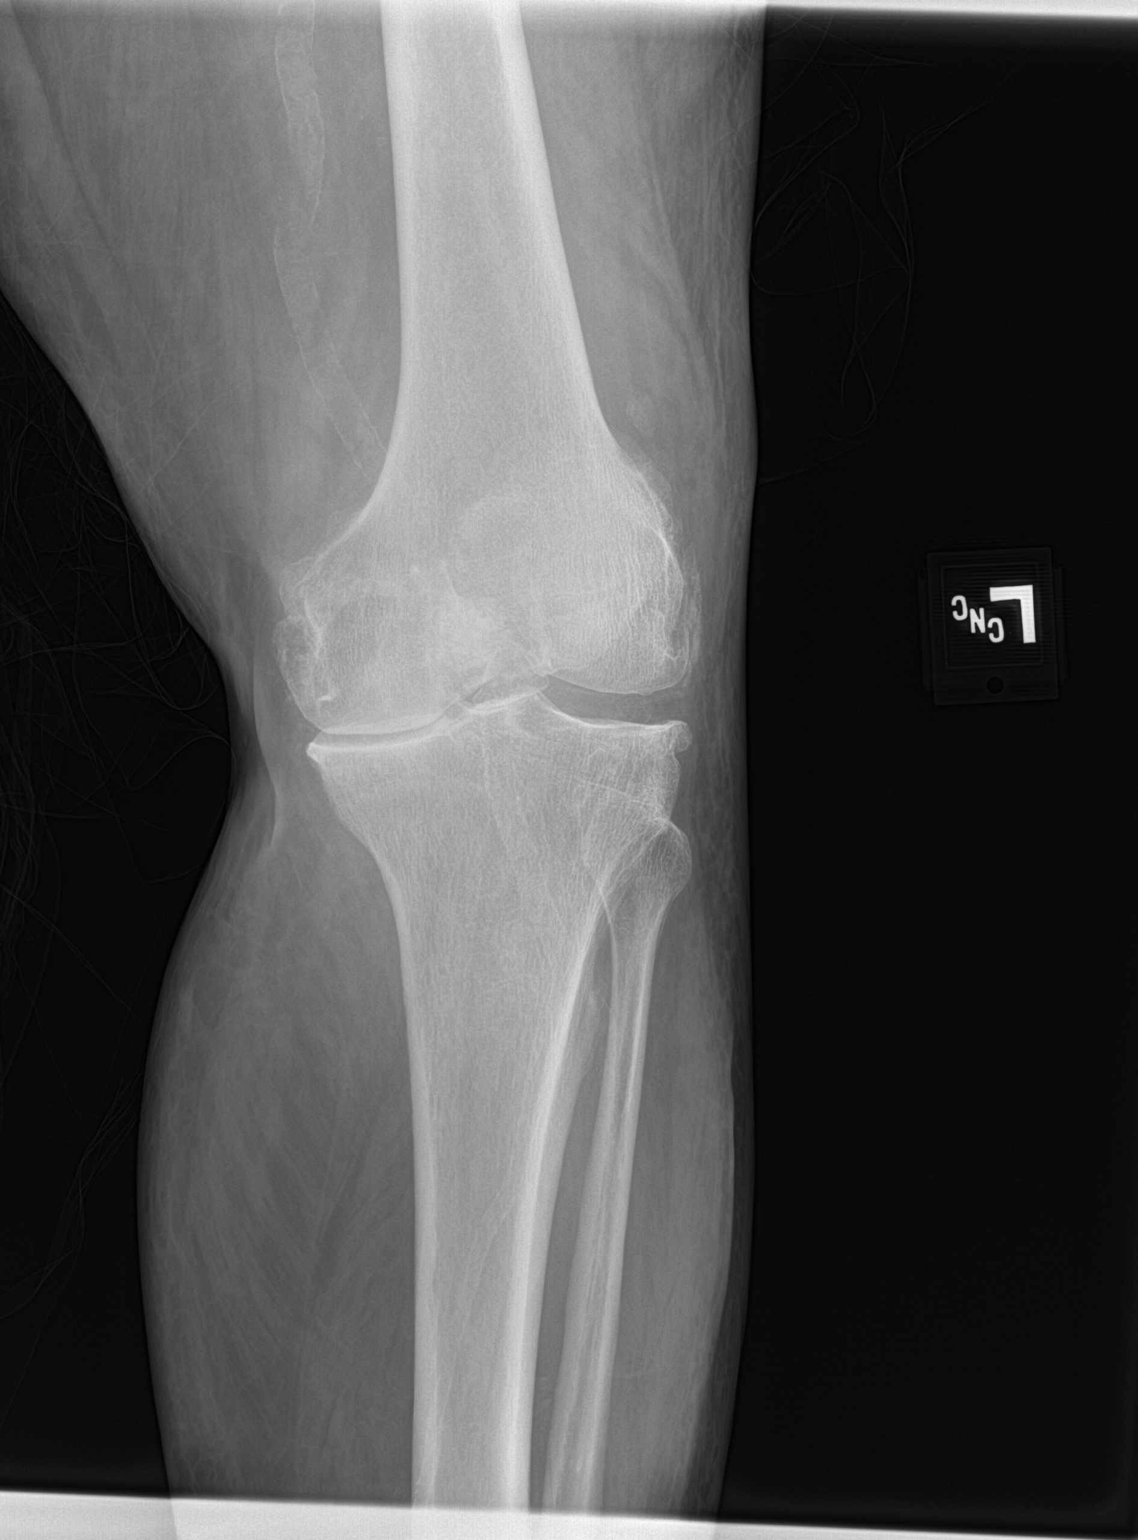

[knee lat]
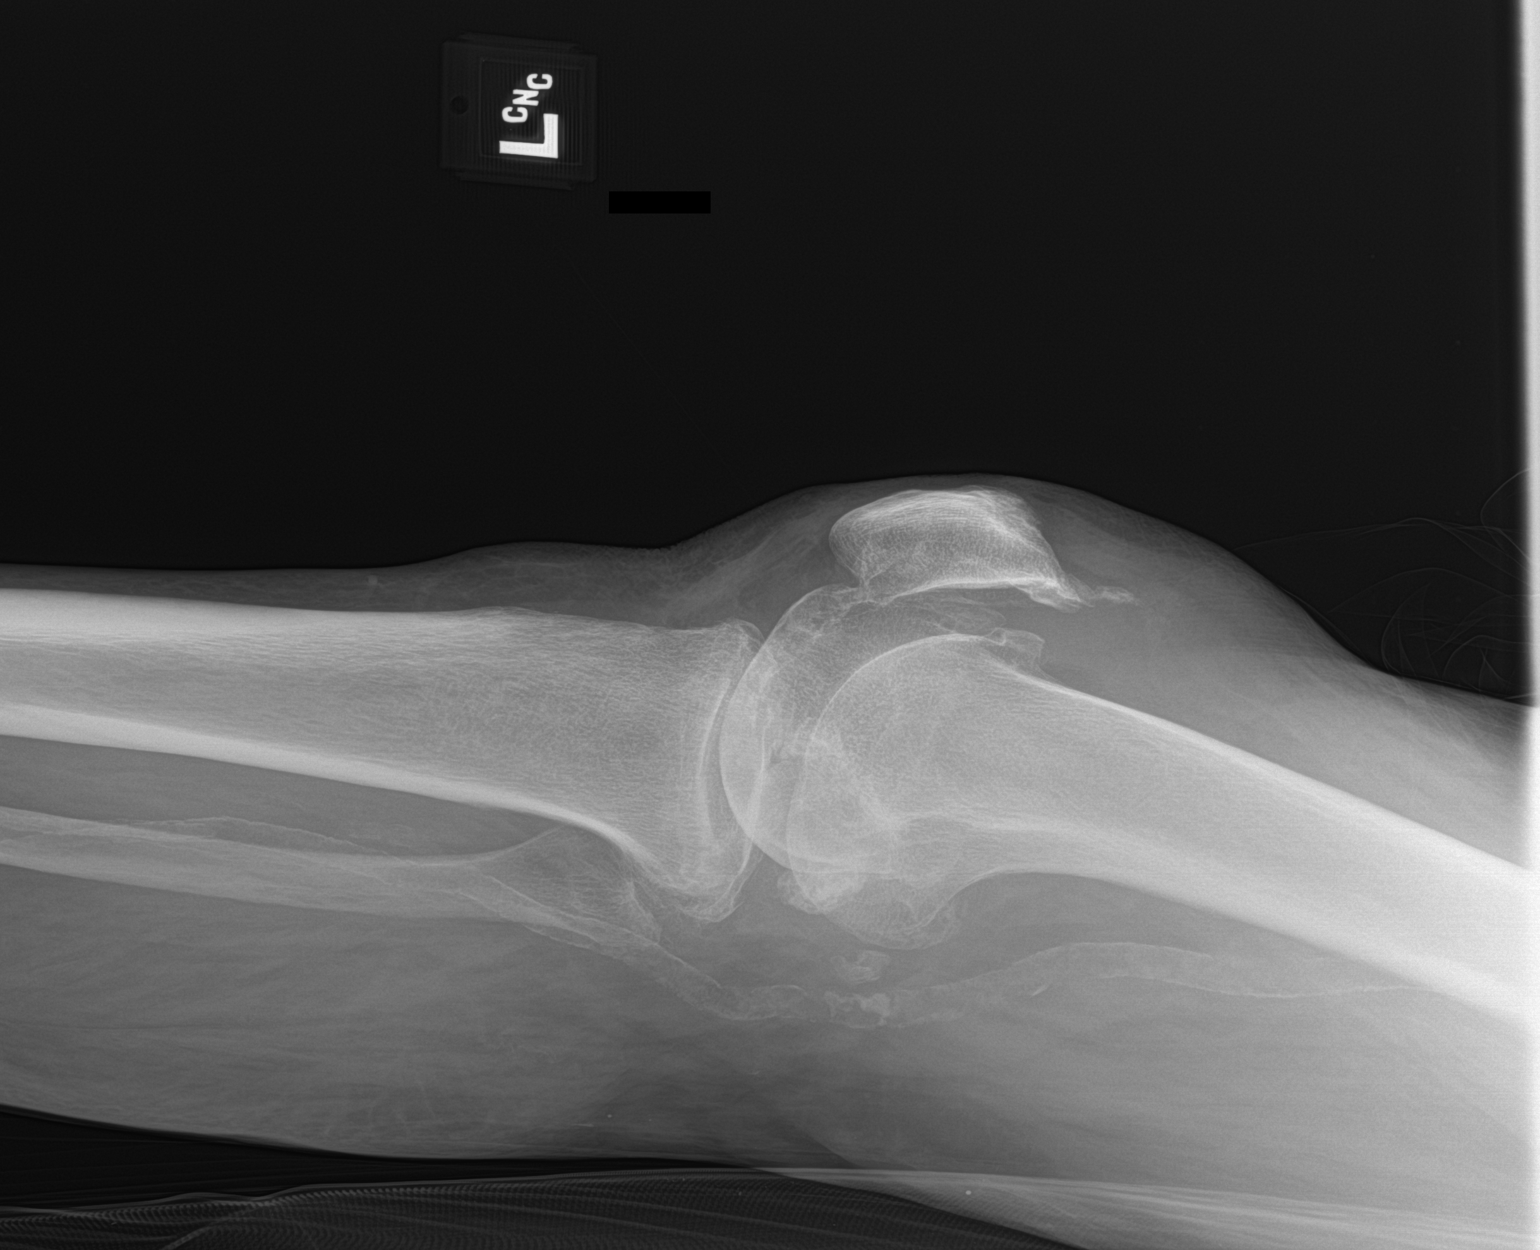

[2 of 2 positions shown; findings below may reference images not displayed]

FINDINGS: Vascular calcification. Tricompartmental degenerative changes with
joint space narrowing medially. Probable small to moderate
suprapatellar joint effusion. Soft tissue swelling above the knee.
No other acute abnormalities.
IMPRESSION: Degenerative changes as above. Suprapatellar joint effusion.
Vascular calcifications.

## 2023-09-21 NOTE — Patient Instructions (Addendum)
Medication Instructions:  Please decrease the metoprolol succinate down to 12.5 mg daily Please decrease the spironolactone down to 12.5 mg daily  Please monitor for low blood pressure  If you need a refill on your cardiac medications before your next appointment, please call your pharmacy.   Lab work: No new labs needed  Testing/Procedures: No new testing needed  Follow-Up: At [facility], you and your health needs are our priority.  As part of our continuing mission to provide you with exceptional heart care, we have created designated Provider Care Teams.  These Care Teams include your primary Cardiologist (physician) and Advanced Practice Providers (APPs -  Physician Assistants and Nurse Practitioners) who all work together to provide you with the care you need, when you need it.  You will need a follow up appointment in 6 months  Providers on your designated Care Team:   Nicolasa Ducking, NP Eula Listen, PA-C Cadence Fransico [place], [place]  COVID-19 Vaccine Information can be found at: [email or web address] For questions related to vaccine distribution or appointments, please email vaccine@[email or web address] or call [phone].

## 2023-10-12 ENCOUNTER — Other Ambulatory Visit: Payer: Self-pay

## 2023-10-12 ENCOUNTER — Telehealth: Payer: Self-pay | Admitting: Family Medicine

## 2023-10-12 NOTE — Telephone Encounter (Signed)
Joseph Macdonald pharmacy faxed refill request for the following medications:   pramipexole (MIRAPEX) 0.5 MG tablet    Please advise

## 2023-10-14 MED ORDER — PRAMIPEXOLE DIHYDROCHLORIDE 0.5 MG PO TABS: 0.5000 mg | ORAL_TABLET | Freq: Every day | ORAL | 3 refills | Status: DC

## 2023-10-18 ENCOUNTER — Encounter: Payer: Self-pay | Admitting: Family Medicine

## 2023-10-18 ENCOUNTER — Other Ambulatory Visit: Payer: Self-pay | Admitting: Family Medicine

## 2023-10-18 DIAGNOSIS — F419 Anxiety disorder, unspecified: Secondary | ICD-10-CM

## 2023-10-18 DIAGNOSIS — M791 Myalgia, unspecified site: Secondary | ICD-10-CM

## 2023-10-18 DIAGNOSIS — M2550 Pain in unspecified joint: Secondary | ICD-10-CM

## 2023-10-18 MED ORDER — OMEPRAZOLE 40 MG PO CPDR: 40.0000 mg | DELAYED_RELEASE_CAPSULE | Freq: Two times a day (BID) | ORAL | 3 refills | Status: AC

## 2023-10-19 MED ORDER — HYDROCODONE-ACETAMINOPHEN 7.5-325 MG PO TABS: 1.0000 | ORAL_TABLET | Freq: Four times a day (QID) | ORAL | 0 refills | Status: DC | PRN

## 2023-10-19 MED ORDER — OLANZAPINE 5 MG PO TBDP
5.0000 mg | ORAL_TABLET | Freq: Two times a day (BID) | ORAL | 5 refills | Status: AC
Start: 2023-10-19 — End: ?

## 2023-10-26 DIAGNOSIS — H40003 Preglaucoma, unspecified, bilateral: Secondary | ICD-10-CM | POA: Diagnosis not present

## 2023-11-02 DIAGNOSIS — H401111 Primary open-angle glaucoma, right eye, mild stage: Secondary | ICD-10-CM | POA: Diagnosis not present

## 2023-11-02 DIAGNOSIS — Z961 Presence of intraocular lens: Secondary | ICD-10-CM | POA: Diagnosis not present

## 2023-11-02 DIAGNOSIS — H40002 Preglaucoma, unspecified, left eye: Secondary | ICD-10-CM | POA: Diagnosis not present

## 2023-11-02 DIAGNOSIS — E119 Type 2 diabetes mellitus without complications: Secondary | ICD-10-CM | POA: Diagnosis not present

## 2023-11-02 LAB — HM DIABETES EYE EXAM

## 2023-11-19 ENCOUNTER — Other Ambulatory Visit: Payer: Self-pay | Admitting: Family Medicine

## 2023-11-19 DIAGNOSIS — M2550 Pain in unspecified joint: Secondary | ICD-10-CM

## 2023-11-19 DIAGNOSIS — M791 Myalgia, unspecified site: Secondary | ICD-10-CM

## 2023-11-19 MED ORDER — HYDROCODONE-ACETAMINOPHEN 7.5-325 MG PO TABS: 1.0000 | ORAL_TABLET | Freq: Four times a day (QID) | ORAL | 0 refills | Status: DC | PRN

## 2023-12-18 ENCOUNTER — Ambulatory Visit: Payer: PPO

## 2023-12-18 VITALS — BP 136/80 | Ht 67.0 in | Wt 197.1 lb

## 2023-12-18 DIAGNOSIS — Z0000 Encounter for general adult medical examination without abnormal findings: Secondary | ICD-10-CM | POA: Diagnosis not present

## 2023-12-18 NOTE — Progress Notes (Signed)
 Subjective:   SARGENT MANKEY is a 78 y.o. male who presents for Medicare Annual/Subsequent preventive examination.  Visit Complete: In person   Cardiac Risk Factors include: advanced age (>62men, >64 women);male gender;hypertension;obesity (BMI >30kg/m2);sedentary lifestyle;smoking/ tobacco exposure     Objective:    Today's Vitals   12/18/23 1410 12/18/23 1421  BP: 136/80   Weight: 197 lb 1.6 oz (89.4 kg)   Height: 5' 7 (1.702 m)   PainSc:  6    Body mass index is 30.87 kg/m.     12/18/2023    2:31 PM 12/14/2022    3:24 PM 11/30/2022    9:20 AM 11/20/2022    8:03 PM 06/08/2022    2:40 PM 05/17/2022    7:00 PM 05/16/2022    1:13 PM  Advanced Directives  Does Patient Have a Medical Advance Directive? Yes Yes No No No Yes Yes  Type of Estate Agent of [place];Living will Healthcare Power of [place];Living will    Healthcare Power of [place] of [place];Living will  Does patient want to make changes to medical advance directive? No - Patient declined No - Patient declined   No - Patient declined No - Patient declined No - Patient declined  Copy of Healthcare Power of Attorney in Chart? Yes - validated most recent copy scanned in chart (See row information) Yes - validated most recent copy scanned in chart (See row information)       Would patient like information on creating a medical advance directive?     No - Patient declined      Current Medications (verified) Outpatient Encounter Medications as of 12/18/2023  Medication Sig   albuterol  (PROVENTIL  HFA;VENTOLIN  HFA) 108 (90 Base) MCG/ACT inhaler Inhale into the lungs every 6 (six) hours as needed for wheezing or shortness of breath.   apixaban  (ELIQUIS ) 5 MG TABS tablet Take 5 mg by mouth 2 (two) times daily.   cholecalciferol  (VITAMIN D ) 25 MCG (1000 UNIT) tablet Take 2,000 Units by mouth daily.   digoxin  (LANOXIN ) 0.125 MG tablet Take 1 tablet (0.125 mg total) by mouth daily.    famotidine (PEPCID) 20 MG tablet Take 20 mg by mouth as needed.   HYDROcodone -acetaminophen  (NORCO) 7.5-325 MG tablet Take 1 tablet by mouth every 6 (six) hours as needed for moderate pain (pain score 4-6).   metFORMIN (GLUCOPHAGE) 500 MG tablet Take 500 mg by mouth 2 (two) times daily with a meal.   metoprolol  succinate (TOPROL -XL) 25 MG 24 hr tablet Take 12.5 mg by mouth daily.   OLANZapine  zydis (ZYPREXA ) 5 MG disintegrating tablet Take 1 tablet (5 mg total) by mouth 2 (two) times daily.   omeprazole  (PRILOSEC) 40 MG capsule Take 1 capsule (40 mg total) by mouth 2 (two) times daily.   pravastatin  (PRAVACHOL ) 20 MG tablet Take 20 mg by mouth daily.   predniSONE  (DELTASONE ) 5 MG tablet Take 10 mg by mouth as directed.   promethazine (PHENERGAN) 12.5 MG tablet Take 12.5 mg by mouth every 4 (four) hours as needed.   pyridostigmine  (MESTINON ) 60 MG tablet Take 1 tablet (60 mg total) by mouth 3 (three) times daily.   spironolactone (ALDACTONE) 25 MG tablet Take 12.5 mg by mouth daily.   tiotropium (SPIRIVA ) 18 MCG inhalation capsule Place 18 mcg into inhaler and inhale daily.   vitamin B-12 (CYANOCOBALAMIN ) 1000 MCG tablet Take 1,000 mcg by mouth daily.   budesonide -formoterol  (SYMBICORT ) 160-4.5 MCG/ACT inhaler Inhale 2 puffs into the lungs 2 (two)  times daily. (Patient not taking: Reported on 09/21/2023)   diazepam  (VALIUM ) 5 MG tablet Take 5 mg by mouth every 6 (six) hours as needed for anxiety. (Patient not taking: Reported on 09/21/2023)   empagliflozin  (JARDIANCE ) 25 MG TABS tablet Take 25 mg by mouth daily. (Patient not taking: Reported on 09/21/2023)   pramipexole  (MIRAPEX ) 0.5 MG tablet Take 1 tablet (0.5 mg total) by mouth at bedtime. (Patient not taking: Reported on 12/18/2023)   terbinafine  (LAMISIL ) 250 MG tablet Take 250 mg by mouth daily. (Patient not taking: Reported on 09/21/2023)   No facility-administered encounter medications on file as of 12/18/2023.    Allergies  (verified) Lisinopril   History: Past Medical History:  Diagnosis Date   Anxiety    Arthritis    Cardiac arrest due to respiratory disorder (HCC) 04/30/2022   CHF (congestive heart failure) (HCC)    COPD (chronic obstructive pulmonary disease) (HCC)    Diabetes mellitus without complication (HCC)    diet controlled   Dyspnea    with activity   Hyperlipidemia    Hypertension    Myasthenia gravis (HCC)    Oxygen  deficit    Polyp of ascending colon 11/30/2022   Ventricular tachycardia [facility])    Past Surgical History:  Procedure Laterality Date   carpel tunnel sx     CATARACT EXTRACTION     right eye   CATARACT EXTRACTION W/PHACO Left 12/01/2020   Procedure: CATARACT EXTRACTION PHACO AND INTRAOCULAR LENS PLACEMENT (IOC) LEFT 10.83 01:14.1 14.6%;  Surgeon: Mittie Gaskin, MD;  Location: [facility];  Service: Ophthalmology;  Laterality: Left;   COLONOSCOPY WITH PROPOFOL  N/A 11/30/2022   Procedure: COLONOSCOPY WITH PROPOFOL ;  Surgeon: Jinny Carmine, MD;  Location: [facility] END[facility]COPY;  Service: Endoscopy;  Laterality: N/A;   CORONARY PRESSURE/FFR STUDY N/A 05/16/2022   Procedure: INTRAVASCULAR PRESSURE WIRE/FFR STUDY;  Surgeon: [name] Bruckner, MD;  Location: [facility] INVASIVE CV LAB;  Service: Cardiovascular;  Laterality: N/A;   ESOPHAGOGASTRODUODENOSCOPY N/A 11/30/2022   Procedure: ESOPHAGOGASTRODUODENOSCOPY (EGD);  Surgeon: Jinny Carmine, MD;  Location: [facility] ENDOSCOPY;  Service: Endoscopy;  Laterality: N/A;   EYE SURGERY     RETINAL DETACHMENT SURGERY     RIGHT/LEFT HEART CATH AND CORONARY ANGIOGRAPHY N/A 05/16/2022   Procedure: RIGHT/LEFT HEART CATH AND CORONARY ANGIOGRAPHY;  Surgeon: [name] Bruckner, MD;  Location: [facility] INVASIVE CV LAB;  Service: Cardiovascular;  Laterality: N/A;   SKIN GRAFT     Behind left knee   TONSILLECTOMY AND ADENOIDECTOMY     Family History  Problem Relation Age of Onset   Hypertension Mother    Thyroid  disease Mother    Kidney disease Mother     Arthritis Mother    Stroke Father    Heart disease Father 91   Arthritis Father    Heart attack Father    Arthritis Other    Social History   Socioeconomic History   Marital status: Married    Spouse name: Not on file   Number of children: 1   Years of education: H/S   Highest education level: High school graduate  Occupational History   Occupation: Retired Engineer, [employer]  Tobacco Use   Smoking status: Former    Current packs/day: 0.00    Average packs/day: 0.5 packs/day for 40.0 years (20.0 ttl pk-yrs)    Types: Pipe, Cigarettes    Start date: 08/19/1977    Quit date: 08/19/2017    Years since quitting: 6.3   Smokeless tobacco: Former    Quit date: 08/19/2017  Tobacco comments:    previously smoked 1 1/2 pack since his 78 years old  Quit 2018  Vaping Use   Vaping status: Former   Substances: Nicotine, Flavoring  Substance and Sexual Activity   Alcohol use: Yes    Comment: occasionally- monthly or less   Drug use: No   Sexual activity: Not on file  Other Topics Concern   Not on file  Social History Narrative   2 adopted children   Social Drivers of Health   Financial Resource Strain: Low Risk  (12/18/2023)   Overall Financial Resource Strain (CARDIA)    Difficulty of Paying Living Expenses: Not hard at all  Food Insecurity: No Food Insecurity (12/18/2023)   Hunger Vital Sign    Worried About Running Out of Food in the Last Year: Never true    Ran Out of Food in the Last Year: Never true  Transportation Needs: No Transportation Needs (12/18/2023)   PRAPARE - Administrator, Civil Service (Medical): No    Lack of Transportation (Non-Medical): No  Physical Activity: Insufficiently Active (12/18/2023)   Exercise Vital Sign    Days of Exercise per Week: 2 days    Minutes of Exercise per Session: 20 min  Stress: No Stress Concern Present (12/18/2023)   [employer] of Occupational Health - Occupational Stress Questionnaire    Feeling of Stress :  Not at all  Social Connections: Moderately Isolated (12/18/2023)   Social Connection and Isolation Panel [NHANES]    Frequency of Communication with Friends and Family: Three times a week    Frequency of Social Gatherings with Friends and Family: More than three times a week    Attends Religious Services: Never    Database Administrator or Organizations: No    Attends Engineer, Structural: Never    Marital Status: Married    Tobacco Counseling Counseling given: Not Answered Tobacco comments: previously smoked 1 1/2 pack since his 78 years old  Quit 2018   Clinical Intake:  Pre-visit preparation completed: Yes  Pain : 0-10 Pain Score: 6  Pain Type: Chronic pain Pain Location: Hip Pain Orientation: Left Pain Descriptors / Indicators: Aching Pain Onset: More than a month ago Pain Frequency: Constant Pain Relieving Factors: PAIN PILLS ONLY TAKE EDGE OFF  Pain Relieving Factors: PAIN PILLS ONLY TAKE EDGE OFF  BMI - recorded: 30.87 Nutritional Status: BMI > 30  Obese Nutritional Risks: None Diabetes: Yes CBG done?: No Did pt. bring in CBG monitor from home?: No  How often do you need to have someone help you when you read instructions, pamphlets, or other written materials from your doctor or pharmacy?: 1 - Never  Interpreter Needed?: No  Information entered by :: JHONNIE DAS, LPN   Activities of Daily Living    12/18/2023    2:33 PM  In your present state of health, do you have any difficulty performing the following activities:  Hearing? 1  Vision? 0  Difficulty concentrating or making decisions? 1  Comment REMEMBERING DIRECTIONS  Walking or climbing stairs? 1  Dressing or bathing? 0  Doing errands, shopping? 1  Preparing Food and eating ? N  Using the Toilet? N  In the past six months, have you accidently leaked urine? N  Do you have problems with loss of bowel control? N  Managing your Medications? Y  Managing your Finances? N  Housekeeping or  managing your Housekeeping? Y    Patient Care Team: [name], MD as  PCP - General (Family Medicine) Perla, Evalene PARAS, MD as PCP - Cardiology (Cardiology) Tegen, Lance, [place] as Consulting Physician (Physician Assistant) Radames Belvia HERO, MD as Referring Physician (Hematology and Oncology) Creig Arthor Hunting, MD as Referring Physician (Neurology) Fernande Elspeth BROCKS, MD as Consulting Physician (Cardiology) End, Lonni, MD as Consulting Physician (Cardiology) Mittie Gaskin, MD as Referring Physician (Ophthalmology) Cathlyn Seal, MD (Dermatology) Perla Evalene PARAS, MD as Consulting Physician (Cardiology) Isakandar, Said B, MD as Consulting Physician (Internal Medicine) Mittie Gaskin, MD as Referring Physician (Ophthalmology)  Indicate any recent Medical Services you may have received from other than [facility] providers in the past year (date may be approximate).     Assessment:   This is a routine wellness examination for [place].  Hearing/Vision screen Hearing Screening - Comments:: NO AIDS, NEEDS THEM  Vision Screening - Comments:: GLASSES FOR FAR VISION, HAD CATARACT SGY- DR.BRASINGTON   Goals Addressed             This Visit's Progress    Cut out extra servings         Depression Screen    12/18/2023    2:27 PM 09/14/2023   10:36 AM 12/14/2022    3:22 PM 09/19/2022    2:58 PM 09/05/2022    2:16 PM 08/07/2022   12:56 PM 06/29/2022    8:37 AM  PHQ 2/9 Scores  PHQ - 2 Score 0 5 0 2 0 0 0  PHQ- 9 Score 0 18 0 8 6      Fall Risk    12/18/2023    2:33 PM 09/14/2023   10:36 AM 12/14/2022    3:25 PM 09/19/2022    2:57 PM 08/07/2022   12:56 PM  Fall Risk   Falls in the past year? 0 0 0 0 0  Number falls in past yr: 0 0 0 0 0  Injury with Fall? 0 0 0 0 0  Risk for fall due to : History of fall(s)  No Fall Risks;Impaired mobility;Impaired balance/gait No Fall Risks Impaired balance/gait;Impaired mobility  Follow up Falls prevention discussed;Falls  evaluation completed  Falls prevention discussed;Falls evaluation completed  Falls evaluation completed;Education provided;Falls prevention discussed    MEDICARE RISK AT HOME: Medicare Risk at Home Any stairs in or around the home?: No If so, are there any without handrails?: No Home free of loose throw rugs in walkways, pet beds, electrical cords, etc?: Yes Adequate lighting in your home to reduce risk of falls?: Yes Life alert?: No Use of a cane, walker or w/c?: Yes (WALKER ALL THE TIME) Grab bars in the bathroom?: Yes Shower chair or bench in shower?: Yes Elevated toilet seat or a handicapped toilet?: No  TIMED UP AND GO:  Was the test performed?  Yes  Length of time to ambulate 10 feet: 6 sec Gait slow and steady with assistive device    Cognitive Function:        12/18/2023    2:35 PM 12/14/2022    3:35 PM  6CIT Screen  What Year? 0 points 0 points  What month? 0 points 0 points  What time? 0 points 0 points  Count back from 20 0 points 0 points  Months in reverse 0 points 0 points  Repeat phrase 0 points 4 points  Total Score 0 points 4 points    Immunizations Immunization History  Administered Date(s) Administered   Fluad Quad(high Dose 65+) 09/08/2020, 09/14/2021, 09/05/2022   Hepatitis B, ADULT 02/04/2019, 03/08/2020, 04/29/2020, 06/28/2020   Influenza,  High Dose Seasonal PF 08/21/2015, 09/12/2016, 08/28/2017, 09/11/2018, 09/11/2019, 09/12/2023   Influenza, Seasonal, Injecte, Preservative Fre 11/10/2009, 09/27/2010, 08/15/2011, 09/06/2012   Influenza-Unspecified 10/20/2004, 10/17/2005, 09/18/2006, 09/24/2007, 09/22/2008, 08/11/2014, 08/25/2015, 08/22/2016, 08/11/2017, 08/12/2018, 08/11/2020, 08/20/2021, 09/05/2022   PFIZER Comirnaty(Gray Top)Covid-19 Tri-Sucrose Vaccine 05/24/2021   PFIZER(Purple Top)SARS-COV-2 Vaccination 01/22/2020, 02/16/2020, 10/21/2020   Pfizer(Comirnaty)Fall Seasonal Vaccine 12 years and older 11/10/2022, 06/19/2023, 09/12/2023    Pneumococcal Conjugate-13 03/11/2014   Pneumococcal Polysaccharide-23 11/10/2009, 04/08/2015   Rsv, Bivalent, Protein Subunit Rsvpref,pf [place]) 11/10/2022   Td 11/10/2022   Tdap 09/06/2012   Zoster Recombinant(Shingrix) 11/27/2017, 02/04/2018    TDAP status: Up to date  Flu Vaccine status: Up to date  Pneumococcal vaccine status: Declined,  Education has been provided regarding the importance of this vaccine but patient still declined. Advised may receive this vaccine at local pharmacy or Health Dept. Aware to provide a copy of the vaccination record if obtained from local pharmacy or Health Dept. Verbalized acceptance and understanding.   Covid-19 vaccine status: Completed vaccines  Qualifies for Shingles Vaccine? Yes   Zostavax completed No   Shingrix Completed?: Yes  Screening Tests Health Maintenance  Topic Date Due   Hepatitis C Screening  Never done   HEMOGLOBIN A1C  10/31/2022   OPHTHALMOLOGY EXAM  11/01/2024   Colonoscopy  12/01/2027   DTaP/Tdap/Td (3 - Td or Tdap) 11/10/2032   Pneumonia Vaccine 37+ Years old  Completed   INFLUENZA VACCINE  Completed   COVID-19 Vaccine  Completed   Zoster Vaccines- Shingrix  Completed   HPV VACCINES  Aged Out    Health Maintenance  Health Maintenance Due  Topic Date Due   Hepatitis C Screening  Never done   HEMOGLOBIN A1C  10/31/2022    Colorectal cancer screening: No longer required.   Lung Cancer Screening: (Low Dose CT Chest recommended if Age 30-80 years, 20 pack-year currently smoking OR have quit w/in 15years.) does not qualify.   Additional Screening:  Hepatitis C Screening: does qualify; Completed NO  Vision Screening: Recommended annual ophthalmology exams for early detection of glaucoma and other disorders of the eye. Is the patient up to date with their annual eye exam?  Yes  Who is the provider or what is the name of the office in which the patient attends annual eye exams? DR.BRASINGTON If pt is not  established with a provider, would they like to be referred to a provider to establish care? No .   Dental Screening: Recommended annual dental exams for proper oral hygiene  Diabetic Foot Exam: Diabetic Foot Exam: Overdue, Pt has been advised about the importance in completing this exam. Pt is scheduled for diabetic foot exam on 00.  Community Resource Referral / Chronic Care Management: CRR required this visit?  No   CCM required this visit?  No     Plan:     I have personally reviewed and noted the following in the patient's chart:   Medical and social history Use of alcohol, tobacco or illicit drugs  Current medications and supplements including opioid prescriptions. Patient is currently taking opioid prescriptions. Information provided to patient regarding non-opioid alternatives. Patient advised to discuss non-opioid treatment plan with their provider. Functional ability and status Nutritional status Physical activity Advanced directives List of other physicians Hospitalizations, surgeries, and ER visits in previous 12 months Vitals Screenings to include cognitive, depression, and falls Referrals and appointments  In addition, I have reviewed and discussed with patient certain preventive protocols, quality metrics, and best practice recommendations. A written  personalized care plan for preventive services as well as general preventive health recommendations were provided to patient.     Jhonnie GORMAN Das, LPN   07/12/7973   After Visit Summary: (In Person-Declined) Patient declined AVS at this time.- ON [facility]  Nurse Notes: NONE

## 2023-12-18 NOTE — Patient Instructions (Addendum)
 Joseph Macdonald , Thank you for taking time to come for your Medicare Wellness Visit. I appreciate your ongoing commitment to your health goals. Please review the following plan we discussed and let me know if I can assist you in the future.   Referrals/Orders/Follow-Ups/Clinician Recommendations: NONE  This is a list of the screening recommended for you and due dates:  Health Maintenance  Topic Date Due   Hepatitis C Screening  Never done   Hemoglobin A1C  10/31/2022   Eye exam for diabetics  11/01/2024   Colon Cancer Screening  12/01/2027   DTaP/Tdap/Td vaccine (3 - Td or Tdap) 11/10/2032   Pneumonia Vaccine  Completed   Flu Shot  Completed   COVID-19 Vaccine  Completed   Zoster (Shingles) Vaccine  Completed   HPV Vaccine  Aged Out    Advanced directives: (In Chart) A copy of your advanced directives are scanned into your chart should your provider ever need it.  Next Medicare Annual Wellness Visit scheduled for next year: Yes   12/23/24 @ 2:30 PM IN PERSON

## 2023-12-21 ENCOUNTER — Other Ambulatory Visit: Payer: Self-pay | Admitting: Family Medicine

## 2023-12-21 DIAGNOSIS — M2550 Pain in unspecified joint: Secondary | ICD-10-CM

## 2023-12-21 DIAGNOSIS — M791 Myalgia, unspecified site: Secondary | ICD-10-CM

## 2023-12-21 MED ORDER — HYDROCODONE-ACETAMINOPHEN 7.5-325 MG PO TABS: 1.0000 | ORAL_TABLET | Freq: Four times a day (QID) | ORAL | 0 refills | Status: DC | PRN

## 2023-12-25 ENCOUNTER — Encounter: Payer: Self-pay | Admitting: Family Medicine

## 2023-12-25 ENCOUNTER — Ambulatory Visit (INDEPENDENT_AMBULATORY_CARE_PROVIDER_SITE_OTHER): Payer: PPO | Admitting: Family Medicine

## 2023-12-25 VITALS — BP 136/74 | HR 60 | Resp 16 | Wt 197.0 lb

## 2023-12-25 DIAGNOSIS — M7022 Olecranon bursitis, left elbow: Secondary | ICD-10-CM

## 2023-12-25 NOTE — Patient Instructions (Signed)
 Marland Kitchen  Please review the attached list of medications and notify my office if there are any errors.   . Please bring all of your medications to every appointment so we can make sure that our medication list is the same as yours.

## 2023-12-27 DIAGNOSIS — L570 Actinic keratosis: Secondary | ICD-10-CM | POA: Diagnosis not present

## 2023-12-27 DIAGNOSIS — B351 Tinea unguium: Secondary | ICD-10-CM | POA: Diagnosis not present

## 2023-12-29 NOTE — Progress Notes (Signed)
 Established patient visit   Patient: Joseph Macdonald   DOB: Jun 15, 1946   78 y.o. Male  MRN: [id number] Visit Date: 12/25/2023  Today's healthcare provider: Nancyann Perry, MD   Chief Complaint  Patient presents with   Joint Swelling    Fluid build up, left elbow   Subjective    HPI Presents with swelling over left elbows for the last weeks. Is not painful. No know trauma.   Medications: Outpatient Medications Prior to Visit  Medication Sig   albuterol  (PROVENTIL  HFA;VENTOLIN  HFA) 108 (90 Base) MCG/ACT inhaler Inhale into the lungs every 6 (six) hours as needed for wheezing or shortness of breath.   apixaban  (ELIQUIS ) 5 MG TABS tablet Take 5 mg by mouth 2 (two) times daily.   budesonide -formoterol  (SYMBICORT ) 160-4.5 MCG/ACT inhaler Inhale 2 puffs into the lungs 2 (two) times daily.   cholecalciferol  (VITAMIN D ) 25 MCG (1000 UNIT) tablet Take 2,000 Units by mouth daily.   diazepam  (VALIUM ) 5 MG tablet Take 5 mg by mouth every 6 (six) hours as needed for anxiety.   digoxin  (LANOXIN ) 0.125 MG tablet Take 1 tablet (0.125 mg total) by mouth daily.   empagliflozin  (JARDIANCE ) 25 MG TABS tablet Take 25 mg by mouth daily.   famotidine (PEPCID) 20 MG tablet Take 20 mg by mouth as needed.   HYDROcodone -acetaminophen  (NORCO) 7.5-325 MG tablet Take 1 tablet by mouth every 6 (six) hours as needed for moderate pain (pain score 4-6).   metFORMIN (GLUCOPHAGE) 500 MG tablet Take 500 mg by mouth 2 (two) times daily with a meal.   metoprolol  succinate (TOPROL -XL) 25 MG 24 hr tablet Take 12.5 mg by mouth daily.   OLANZapine  zydis (ZYPREXA ) 5 MG disintegrating tablet Take 1 tablet (5 mg total) by mouth 2 (two) times daily.   omeprazole  (PRILOSEC) 40 MG capsule Take 1 capsule (40 mg total) by mouth 2 (two) times daily.   pravastatin  (PRAVACHOL ) 20 MG tablet Take 20 mg by mouth daily.   predniSONE  (DELTASONE ) 5 MG tablet Take 10 mg by mouth as directed.   pyridostigmine  (MESTINON ) 60 MG tablet  Take 1 tablet (60 mg total) by mouth 3 (three) times daily.   spironolactone (ALDACTONE) 25 MG tablet Take 12.5 mg by mouth daily.   tiotropium (SPIRIVA ) 18 MCG inhalation capsule Place 18 mcg into inhaler and inhale daily.   vitamin B-12 (CYANOCOBALAMIN ) 1000 MCG tablet Take 1,000 mcg by mouth daily.   terbinafine  (LAMISIL ) 250 MG tablet Take 250 mg by mouth daily.   [DISCONTINUED] pramipexole  (MIRAPEX ) 0.5 MG tablet Take 1 tablet (0.5 mg total) by mouth at bedtime. (Patient not taking: Reported on 12/18/2023)   [DISCONTINUED] promethazine (PHENERGAN) 12.5 MG tablet Take 12.5 mg by mouth every 4 (four) hours as needed.   No facility-administered medications prior to visit.    Review of Systems     Objective    BP 136/74 (BP Location: Right Arm, Patient Position: Sitting, Cuff Size: Normal)   Pulse 60   Resp 16   Wt 197 lb (89.4 kg)   BMI 30.85 kg/m    Physical Exam  Golf ball sized fluctuate masses, over left olecranon, not tender, no erythema. Intact skin. No trauma.   Assessment & Plan     1. Olecranon bursitis of left elbow (Primary) No sign of infection or trauma. Advised of conservatives treatment including compression wraps. . Consider orthopedic referral if not going doing down in 4-5 weeks.  Nancyann Perry, MD  [facility] [phone] (phone) [phone] (fax)  [facility]

## 2024-01-21 ENCOUNTER — Other Ambulatory Visit: Payer: Self-pay | Admitting: Family Medicine

## 2024-01-21 DIAGNOSIS — M791 Myalgia, unspecified site: Secondary | ICD-10-CM

## 2024-01-21 DIAGNOSIS — M2550 Pain in unspecified joint: Secondary | ICD-10-CM

## 2024-01-21 NOTE — Telephone Encounter (Signed)
 Medication Refill -  Most Recent Primary Care Visit:  Provider: Lamon Pillow  Department: [id number]  Visit Type: OFFICE VISIT  Date: 12/25/2023  Medication: HYDROcodone -acetaminophen  (NORCO) 7.5-325 MG tablet   Has the patient contacted their pharmacy? Yes   Is this the correct pharmacy for this prescription? Yes If no, delete pharmacy and type the correct one.  This is the patient's preferred pharmacy:   [facility] [id number] - [place], [place] - [address] Phone: [phone]  Fax: [phone]      Has the prescription been filled recently? No  Is the patient out of the medication? No but he only has a few left  Has the patient been seen for an appointment in the last year OR does the patient have an upcoming appointment? Yes  Can we respond through MyChart? Yes  Please assist patient further

## 2024-01-22 MED ORDER — HYDROCODONE-ACETAMINOPHEN 7.5-325 MG PO TABS: 1.0000 | ORAL_TABLET | Freq: Four times a day (QID) | ORAL | 0 refills | Status: DC | PRN

## 2024-01-22 NOTE — Telephone Encounter (Signed)
Requested medication (s) are due for refill today - yes  Requested medication (s) are on the active medication list -yes  Future visit scheduled -no  Last refill: 12/21/23 #120  Notes to clinic: non delegated Rx  Requested Prescriptions  Pending Prescriptions Disp Refills   HYDROcodone-acetaminophen (NORCO) 7.5-325 MG tablet 120 tablet 0    Sig: Take 1 tablet by mouth every 6 (six) hours as needed for moderate pain (pain score 4-6).     Not Delegated - Analgesics:  Opioid Agonist Combinations Failed - 01/22/2024  8:08 AM      Failed - This refill cannot be delegated      Failed - Urine Drug Screen completed in last 360 days      Passed - Valid encounter within last 3 months    Recent Outpatient Visits           4 weeks ago Olecranon bursitis of left elbow   [place] [facility] Malva Limes, MD   4 months ago Myalgia   [place] [facility] Malva Limes, MD   1 year ago RUQ abdominal pain   [facility] Simmons-Robinson, [place], MD   1 year ago Epigastric pain   [place] [facility] Downing, [place], MD   1 year ago Anxiety   [place] [facility] Malva Limes, MD                 Requested Prescriptions  Pending Prescriptions Disp Refills   HYDROcodone-acetaminophen (NORCO) 7.5-325 MG tablet 120 tablet 0    Sig: Take 1 tablet by mouth every 6 (six) hours as needed for moderate pain (pain score 4-6).     Not Delegated - Analgesics:  Opioid Agonist Combinations Failed - 01/22/2024  8:08 AM      Failed - This refill cannot be delegated      Failed - Urine Drug Screen completed in last 360 days      Passed - Valid encounter within last 3 months    Recent Outpatient Visits           4 weeks ago Olecranon bursitis of left elbow   [facility] Malva Limes, MD   4 months ago Myalgia   [facility] Malva Limes, MD   1 year ago RUQ abdominal pain   [place] [facility] Simmons-Robinson, [place], MD   1 year ago Epigastric pain    [facility] Merna, [place], MD   1 year ago Anxiety   [facility] Malva Limes, MD

## 2024-02-07 DIAGNOSIS — B351 Tinea unguium: Secondary | ICD-10-CM | POA: Diagnosis not present

## 2024-02-07 DIAGNOSIS — L570 Actinic keratosis: Secondary | ICD-10-CM | POA: Diagnosis not present

## 2024-03-10 ENCOUNTER — Other Ambulatory Visit: Payer: Self-pay | Admitting: Family Medicine

## 2024-03-10 DIAGNOSIS — M2550 Pain in unspecified joint: Secondary | ICD-10-CM

## 2024-03-10 DIAGNOSIS — M791 Myalgia, unspecified site: Secondary | ICD-10-CM

## 2024-03-10 NOTE — Telephone Encounter (Signed)
 Copied from CRM [id number]. Topic: Clinical - Medication Refill >> Mar 10, 2024  1:42 PM Truddie Crumble wrote: Most Recent Primary Care Visit:  Provider: Malva Limes  Department: [id number]  Visit Type: OFFICE VISIT  Date: 12/25/2023  Medication: HYDROcodone-acetaminophen (NORCO) 7.5-325 MG tablet and prednisone  Has the patient contacted their pharmacy? Yes (Agent: If no, request that the patient contact the pharmacy for the refill. If patient does not wish to contact the pharmacy document the reason why and proceed with request.) (Agent: If yes, when and what did the pharmacy advise?)  Is this the correct pharmacy for this prescription? Yes If no, delete pharmacy and type the correct one.  This is the patient's preferred pharmacy:  [facility] [id number] [place], [place] - [address] [place] [address] Phone: [phone] Fax: [phone]  Has the prescription been filled recently? Yes  Is the patient out of the medication? Yes  Has the patient been seen for an appointment in the last year OR does the patient have an upcoming appointment? Yes  Can we respond through MyChart? Yes  Agent: Please be advised that Rx refills may take up to 3 business days. We ask that you follow-up with your pharmacy.

## 2024-03-11 ENCOUNTER — Other Ambulatory Visit: Payer: Self-pay

## 2024-03-11 DIAGNOSIS — M791 Myalgia, unspecified site: Secondary | ICD-10-CM

## 2024-03-11 DIAGNOSIS — M2550 Pain in unspecified joint: Secondary | ICD-10-CM

## 2024-03-11 MED ORDER — HYDROCODONE-ACETAMINOPHEN 7.5-325 MG PO TABS: 1.0000 | ORAL_TABLET | Freq: Four times a day (QID) | ORAL | 0 refills | Status: DC | PRN

## 2024-03-12 NOTE — Telephone Encounter (Signed)
 Requested medication (s) are due for refill today: no  Requested medication (s) are on the active medication list:  hydrocodone- acetaminophen: yes          prednisone: hx med  Last refill:  hydrocodone acetaminophen 03/11/24 #120 tab       prednisone: 02/20/23   Future visit scheduled: no  Notes to clinic:hydrocodone-acetaminophen not delegated to NT to RF Prednisone: hx provider   Requested Prescriptions  Pending Prescriptions Disp Refills   HYDROcodone-acetaminophen (NORCO) 7.5-325 MG tablet 120 tablet 0    Sig: Take 1 tablet by mouth every 6 (six) hours as needed for moderate pain (pain score 4-6).     Not Delegated - Analgesics:  Opioid Agonist Combinations Failed - 03/12/2024  1:03 PM      Failed - This refill cannot be delegated      Failed - Urine Drug Screen completed in last 360 days      Failed - Valid encounter within last 3 months    Recent Outpatient Visits   None             predniSONE (DELTASONE) 5 MG tablet      Sig: Take 2 tablets (10 mg total) by mouth as directed.     Not Delegated - Endocrinology:  Oral Corticosteroids Failed - 03/12/2024  1:03 PM      Failed - This refill cannot be delegated      Failed - Manual Review: Eye exam for IOP if prolonged treatment      Failed - Glucose (serum) in normal range and within 180 days    Glucose  Date Value Ref Range Status  12/21/2015 142 mg/dL Final   Glucose, Bld  Date Value Ref Range Status  11/20/2022 184 (H) 70 - 99 mg/dL Final    Comment:    Glucose reference range applies only to samples taken after fasting for at least 8 hours.   Glucose-Capillary  Date Value Ref Range Status  06/13/2022 227 (H) 70 - 99 mg/dL Final    Comment:    Glucose reference range applies only to samples taken after fasting for at least 8 hours.         Failed - K in normal range and within 180 days    Potassium  Date Value Ref Range Status  11/20/2022 3.9 3.5 - 5.1 mmol/L Final         Failed - Na in normal range  and within 180 days    Sodium  Date Value Ref Range Status  11/20/2022 139 135 - 145 mmol/L Final  09/05/2022 138 134 - 144 mmol/L Final         Failed - Valid encounter within last 6 months    Recent Outpatient Visits   None            Failed - Bone Mineral Density or Dexa Scan completed in the last 2 years      Passed - Last BP in normal range    BP Readings from Last 1 Encounters:  12/25/23 136/74

## 2024-04-24 ENCOUNTER — Other Ambulatory Visit: Payer: Self-pay | Admitting: Family Medicine

## 2024-04-24 DIAGNOSIS — M791 Myalgia, unspecified site: Secondary | ICD-10-CM

## 2024-04-24 DIAGNOSIS — M2550 Pain in unspecified joint: Secondary | ICD-10-CM

## 2024-04-24 NOTE — Telephone Encounter (Signed)
 Copied from CRM [id number]. Topic: Clinical - Medication Refill >> Apr 24, 2024  4:38 PM Sophia H wrote: Medication: HYDROcodone -acetaminophen  (NORCO) 7.5-325 MG tablet  Has the patient contacted their pharmacy? Yes (Agent: If no, request that the patient contact the pharmacy for the refill. If patient does not wish to contact the pharmacy document the reason why and proceed with request.) (Agent: If yes, when and what did the pharmacy advise?)  This is the patient's preferred pharmacy:  [facility] [id number] [place], [place] - [address] [place] [address] Phone: [phone] Fax: [phone]   Is this the correct pharmacy for this prescription? Yes If no, delete pharmacy and type the correct one.   Has the prescription been filled recently? Yes  Is the patient out of the medication? Yes  Has the patient been seen for an appointment in the last year OR does the patient have an upcoming appointment? Yes  Can we respond through MyChart? Yes  Agent: Please be advised that Rx refills may take up to 3 business days. We ask that you follow-up with your pharmacy.

## 2024-04-25 DIAGNOSIS — H40002 Preglaucoma, unspecified, left eye: Secondary | ICD-10-CM | POA: Diagnosis not present

## 2024-04-25 DIAGNOSIS — H401111 Primary open-angle glaucoma, right eye, mild stage: Secondary | ICD-10-CM | POA: Diagnosis not present

## 2024-04-25 DIAGNOSIS — Z961 Presence of intraocular lens: Secondary | ICD-10-CM | POA: Diagnosis not present

## 2024-04-28 MED ORDER — HYDROCODONE-ACETAMINOPHEN 7.5-325 MG PO TABS
1.0000 | ORAL_TABLET | Freq: Four times a day (QID) | ORAL | 0 refills | Status: DC | PRN
Start: 2024-04-28 — End: 2024-06-03

## 2024-04-28 NOTE — Telephone Encounter (Signed)
 Requested medication (s) are due for refill today: yes  Requested medication (s) are on the active medication list: yes  Last refill:    Future visit scheduled: yes  Notes to clinic:  Unable to refill per protocol, cannot delegate.      Requested Prescriptions  Pending Prescriptions Disp Refills   HYDROcodone -acetaminophen  (NORCO) 7.5-325 MG tablet 120 tablet 0    Sig: Take 1 tablet by mouth every 6 (six) hours as needed for moderate pain (pain score 4-6).     Not Delegated - Analgesics:  Opioid Agonist Combinations Failed - 04/28/2024 11:58 AM      Failed - This refill cannot be delegated      Failed - Urine Drug Screen completed in last 360 days      Failed - Valid encounter within last 3 months    Recent Outpatient Visits   None

## 2024-05-07 DIAGNOSIS — M25562 Pain in left knee: Secondary | ICD-10-CM | POA: Diagnosis not present

## 2024-05-07 DIAGNOSIS — M170 Bilateral primary osteoarthritis of knee: Secondary | ICD-10-CM | POA: Diagnosis not present

## 2024-05-07 DIAGNOSIS — R262 Difficulty in walking, not elsewhere classified: Secondary | ICD-10-CM | POA: Diagnosis not present

## 2024-05-07 DIAGNOSIS — M25561 Pain in right knee: Secondary | ICD-10-CM | POA: Diagnosis not present

## 2024-05-14 DIAGNOSIS — M1712 Unilateral primary osteoarthritis, left knee: Secondary | ICD-10-CM | POA: Diagnosis not present

## 2024-05-14 DIAGNOSIS — R262 Difficulty in walking, not elsewhere classified: Secondary | ICD-10-CM | POA: Diagnosis not present

## 2024-05-14 DIAGNOSIS — M1711 Unilateral primary osteoarthritis, right knee: Secondary | ICD-10-CM | POA: Diagnosis not present

## 2024-05-14 DIAGNOSIS — M170 Bilateral primary osteoarthritis of knee: Secondary | ICD-10-CM | POA: Diagnosis not present

## 2024-05-14 DIAGNOSIS — M25662 Stiffness of left knee, not elsewhere classified: Secondary | ICD-10-CM | POA: Diagnosis not present

## 2024-05-14 DIAGNOSIS — M25562 Pain in left knee: Secondary | ICD-10-CM | POA: Diagnosis not present

## 2024-05-14 DIAGNOSIS — M25561 Pain in right knee: Secondary | ICD-10-CM | POA: Diagnosis not present

## 2024-05-14 DIAGNOSIS — M25661 Stiffness of right knee, not elsewhere classified: Secondary | ICD-10-CM | POA: Diagnosis not present

## 2024-05-21 DIAGNOSIS — M25562 Pain in left knee: Secondary | ICD-10-CM | POA: Diagnosis not present

## 2024-05-21 DIAGNOSIS — R262 Difficulty in walking, not elsewhere classified: Secondary | ICD-10-CM | POA: Diagnosis not present

## 2024-05-21 DIAGNOSIS — M25561 Pain in right knee: Secondary | ICD-10-CM | POA: Diagnosis not present

## 2024-05-21 DIAGNOSIS — M170 Bilateral primary osteoarthritis of knee: Secondary | ICD-10-CM | POA: Diagnosis not present

## 2024-05-28 DIAGNOSIS — M170 Bilateral primary osteoarthritis of knee: Secondary | ICD-10-CM | POA: Diagnosis not present

## 2024-05-28 DIAGNOSIS — M25561 Pain in right knee: Secondary | ICD-10-CM | POA: Diagnosis not present

## 2024-05-28 DIAGNOSIS — M25562 Pain in left knee: Secondary | ICD-10-CM | POA: Diagnosis not present

## 2024-05-28 DIAGNOSIS — R262 Difficulty in walking, not elsewhere classified: Secondary | ICD-10-CM | POA: Diagnosis not present

## 2024-06-03 ENCOUNTER — Other Ambulatory Visit: Payer: Self-pay | Admitting: Family Medicine

## 2024-06-03 DIAGNOSIS — M791 Myalgia, unspecified site: Secondary | ICD-10-CM

## 2024-06-03 DIAGNOSIS — M2550 Pain in unspecified joint: Secondary | ICD-10-CM

## 2024-06-03 NOTE — Telephone Encounter (Signed)
 Copied from CRM [id number]. Topic: Clinical - Medication Refill >> Jun 03, 2024  2:10 PM Ivette P wrote: Medication: HYDROcodone -acetaminophen  (NORCO) 7.5-325 MG tablet  Has the patient contacted their pharmacy? Yes (Agent: If no, request that the patient contact the pharmacy for the refill. If patient does not wish to contact the pharmacy document the reason why and proceed with request.) (Agent: If yes, when and what did the pharmacy advise?)  This is the patient's preferred pharmacy:  [facility] [id number] [place], [place] - [address] [place] [address] Phone: [phone] Fax: [phone]   Is this the correct pharmacy for this prescription? Yes If no, delete pharmacy and type the correct one.   Has the prescription been filled recently? No, 04/28/2024  Is the patient out of the medication? Yes  Has the patient been seen for an appointment in the last year OR does the patient have an upcoming appointment? Yes, 12/25/2023  Can we respond through MyChart? Yes  Agent: Please be advised that Rx refills may take up to 3 business days. We ask that you follow-up with your pharmacy.

## 2024-06-04 DIAGNOSIS — M25562 Pain in left knee: Secondary | ICD-10-CM | POA: Diagnosis not present

## 2024-06-04 DIAGNOSIS — M25561 Pain in right knee: Secondary | ICD-10-CM | POA: Diagnosis not present

## 2024-06-04 DIAGNOSIS — R262 Difficulty in walking, not elsewhere classified: Secondary | ICD-10-CM | POA: Diagnosis not present

## 2024-06-04 DIAGNOSIS — M170 Bilateral primary osteoarthritis of knee: Secondary | ICD-10-CM | POA: Diagnosis not present

## 2024-06-05 NOTE — Telephone Encounter (Signed)
 Requested medication (s) are due for refill today: yes  Requested medication (s) are on the active medication list: yes  Last refill:  04/28/24  Future visit scheduled: yes  Notes to clinic:  Unable to refill per protocol, cannot delegate.       Requested Prescriptions  Pending Prescriptions Disp Refills   HYDROcodone -acetaminophen  (NORCO) 7.5-325 MG tablet 120 tablet 0    Sig: Take 1 tablet by mouth every 6 (six) hours as needed for moderate pain (pain score 4-6).     Not Delegated - Analgesics:  Opioid Agonist Combinations Failed - 06/05/2024 11:24 AM      Failed - This refill cannot be delegated      Failed - Urine Drug Screen completed in last 360 days      Failed - Valid encounter within last 3 months    Recent Outpatient Visits   None

## 2024-06-06 MED ORDER — HYDROCODONE-ACETAMINOPHEN 7.5-325 MG PO TABS: 1.0000 | ORAL_TABLET | Freq: Four times a day (QID) | ORAL | 0 refills | Status: DC | PRN

## 2024-06-06 NOTE — Addendum Note (Signed)
 Addended by: [name] on: 06/06/2024 10:22 AM   Modules accepted: Orders

## 2024-06-24 ENCOUNTER — Ambulatory Visit: Admitting: Cardiovascular Disease

## 2024-07-26 NOTE — Progress Notes (Deleted)
 Cardiology Clinic Note   Date: 07/26/2024 ID: [name], DOB [birth year], MRN [id number]  Primary Cardiologist:  [name], MD  Chief Complaint   Joseph Macdonald is a 78 y.o. male who presents to the clinic today for ***  Patient Profile   Joseph Macdonald is followed by Dr. [name] for the history outlined below.      Past medical history significant for: CAD. R/LHC 05/16/2022: Moderate single-vessel CAD with 60% mid LAD stenosis that is borderline significant.  No angiographically significant CAD in LCx or RCA.  Recommendation for medical management of moderate LAD disease that is borderline significant by IFR but supplies a relatively small territory as the LV apex is perfused) from the distal RCA. 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 R/LHC 05/16/2022: Upper normal to mildly elevated LVEDP.  Moderate pulmonary hypertension.  Moderate to severely elevated right heart filling pressure.  Normal Fick cardiac output/index. Echo 09/12/2022: EF 55 to 60%.  No RWMA.  Moderate LVH.  Grade I DD.  Mildly reduced RV function.  Normal RV size.  Aortic valve sclerosis/calcification without stenosis.  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  (EF 35 to 40% May 2023). Nonsustained VT. PAF. Hypertension. Hyperlipidemia. COPD. T2DM. Myasthenia gravis. S/p myasthenia crisis requiring plasma exchange [facility] January 2020. PE.  In summary, patient was first evaluated by [name] on 03/12/2023 shortness of breath and lower extremity edema.  Patient reported a history of myasthenia gravis complicated by myasthenia crisis requiring plasma exchange at [name] in January 2020.  He had presented to the [facility] with marked weakness consistent with myasthenia crisis he was transferred to [facility] to undergo plasma exchange and was noted to have episodes of NSVT.  He was referred for Holter monitor and was found to  be in wide-complex tachycardia.  He was admitted to [facility] ICU and initiated on amiodarone.  Extensive workup including echo, LHC, cardiac MRI with no evidence of cardiomyopathy or significant CAD.  He had not been seen in cardiology for follow-up and was concerned so self-referred to see [name].  He reported of months to years history of intermittent exertional chest discomfort as the somebody was pressing on his chest.  No palpitations but noted intermittent orthostatic lightheadedness as well as disequilibrium.  He reported chronic DOE without orthopnea and intermittent PND.  He reported worsening DOE prompting during [place] emergency visit for further evaluation.  He also reported progressive lower extremity edema and 15 pound weight gain in the the prior 3 to 4 months.  Records were reviewed from EP during [place] and indicated 2 to 3 minutes of wide-complex tachycardia consistent with sustained VT.  He was evaluated by [name] in June 2020 who felt VT was nonsustained.  Amiodarone was decreased.  Patient presented to the ED on 04/30/2022 with chest pain and respiratory distress with SpO2 in the 70s on room air improved to 90% on nonrebreather.  According to EMS patient complained of progressively worsening dyspnea, orthopnea, bilateral lower extremity edema.  He reported not adherence to cardiac medications including diuretic.  Upon arrival to the ED patient was in severe respiratory distress with SpO2 in the 50s, cyanotic, wheezing and crackles throughout with elevated JVD 3+ pitting edema bilaterally.  Upon arrival to ED patient went into respiratory arrest leading to cardiac arrest.  8 minutes of CPR was performed with 3 rounds of epinephrine prior to ROSC.  Patient with unknown  rhythm at that time secondary to not being monitored.  Patient was intubated.  Echo demonstrated EF 35 to 40%, global hypokinesis, mild LVH, Grade I DD, moderately reduced RV function, mild RVH.  R/LHC showed moderate single-vessel CAD  with borderline significance with recommendation for medical treatment, moderate pulmonary hypertension and moderate to severely elevated right heart filling pressures.  He was discharged to SNF.  Repeat echo October 2023 showed EF 55 to 60% as detailed above.  Patient was last seen in the office by Dr. [name] on 09/21/2023 for routine follow-up.  He reported the [place] stopped Entresto and Jardiance secondary to complaints of feeling weak and tired with dramatic weight loss over several months.  Toprol was decreased secondary to bradycardia.  Spironolactone was decreased.     History of Present Illness    Today, patient ***  CAD Moderate single-vessel CAD with 60% mid LAD stenosis with borderline significant per angiography June 2023.  Patient*** - Continue Toprol, pravastatin.  Not on aspirin secondary to Eliquis.  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  Patient*** Euvolemic and well compensated on exam. - Continue spironolactone, Toprol.  Nonsustained VT/PAF Patient***EKG*** EP*** - Continue digoxin, Toprol, Eliquis.  Hypertension BP today*** - Continue spironolactone, Toprol.  Hyperlipidemia Lipid panel*** - Continue pravastatin.  ROS: All other systems reviewed and are otherwise negative except as noted in History of Present Illness.  EKGs/Labs Reviewed        No results found for requested labs within last 365 days.   No results found for requested labs within last 365 days.   No results found for requested labs within last 365 days.   No results found for requested labs within last 365 days.  ***  Risk Assessment/Calculations    {Does this patient have ATRIAL FIBRILLATION?:[id number]} No BP recorded.  {Refresh Note OR Click here to enter BP  :1}***        Physical Exam    VS:  There were no vitals taken for this visit. , BMI There  is no height or weight on file to calculate BMI.  GEN: Well nourished, well developed, in no acute distress. Neck: No JVD or carotid bruits. Cardiac: *** RRR. *** No murmur. No rubs or gallops.   Respiratory:  Respirations regular and unlabored. Clear to auscultation without rales, wheezing or rhonchi. GI: Soft, nontender, nondistended. Extremities: Radials/DP/PT 2+ and equal bilaterally. No clubbing or cyanosis. No edema ***  Skin: Warm and dry, no rash. Neuro: Strength intact.  Assessment & Plan   ***  Disposition: ***     {Are you ordering a CV Procedure (e.g. stress test, cath, DCCV, TEE, etc)?   Press F2        :[id number]}   Signed, Barnie HERO. Sharece Fleischhacker, DNP, NP-C

## 2024-07-30 ENCOUNTER — Ambulatory Visit: Attending: Student | Admitting: Student

## 2024-08-06 ENCOUNTER — Other Ambulatory Visit: Payer: Self-pay | Admitting: Family Medicine

## 2024-08-06 DIAGNOSIS — M2550 Pain in unspecified joint: Secondary | ICD-10-CM

## 2024-08-06 DIAGNOSIS — M791 Myalgia, unspecified site: Secondary | ICD-10-CM

## 2024-08-06 MED ORDER — HYDROCODONE-ACETAMINOPHEN 7.5-325 MG PO TABS: 1.0000 | ORAL_TABLET | Freq: Four times a day (QID) | ORAL | 0 refills | Status: DC | PRN

## 2024-08-29 ENCOUNTER — Ambulatory Visit: Attending: Cardiovascular Disease | Admitting: Cardiovascular Disease

## 2024-08-29 VITALS — BP 118/76 | HR 69 | Ht 67.0 in | Wt 189.6 lb

## 2024-08-29 DIAGNOSIS — I472 Ventricular tachycardia, unspecified: Secondary | ICD-10-CM

## 2024-08-29 DIAGNOSIS — G7000 Myasthenia gravis without (acute) exacerbation: Secondary | ICD-10-CM | POA: Diagnosis not present

## 2024-08-29 DIAGNOSIS — R001 Bradycardia, unspecified: Secondary | ICD-10-CM | POA: Diagnosis not present

## 2024-08-29 DIAGNOSIS — I10 Essential (primary) hypertension: Secondary | ICD-10-CM | POA: Diagnosis not present

## 2024-08-29 DIAGNOSIS — I890 Lymphedema, not elsewhere classified: Secondary | ICD-10-CM | POA: Diagnosis not present

## 2024-08-29 DIAGNOSIS — I422 Other hypertrophic cardiomyopathy: Secondary | ICD-10-CM

## 2024-08-29 DIAGNOSIS — E119 Type 2 diabetes mellitus without complications: Secondary | ICD-10-CM

## 2024-08-29 DIAGNOSIS — E785 Hyperlipidemia, unspecified: Secondary | ICD-10-CM | POA: Diagnosis not present

## 2024-08-29 DIAGNOSIS — I5032 Chronic diastolic (congestive) heart failure: Secondary | ICD-10-CM | POA: Diagnosis not present

## 2024-08-29 DIAGNOSIS — J449 Chronic obstructive pulmonary disease, unspecified: Secondary | ICD-10-CM

## 2024-08-29 NOTE — Progress Notes (Signed)
 Cardiology Office Note  Date:  08/29/2024   ID:  Joseph Macdonald, DOB 01/18/1946, MRN [id number]  PCP:  [name], MD   Cc: Follow-up of his shortness of breath  HPI:  Joseph Macdonald is a 78 y.o. male with history of  sustained VT,  myasthenia gravis with myasthenia crisis requiring plasma exchange at [facility] in 12/2018, June 2023 HFpEF,  HTN,  smoking, COPD, on oxygen  HLD  HCM CAd: 60% LAD  June 2023 June 23: Ejection fraction estimated 35 to 40% on echo Echocardiogram October 2023 EF 55% who presents for follow up of HFpEF, atrial fibrillation on Eliquis , cardiomyopathy, acute on chronic respiratory failure  Last seen by myself in the clinic 10/24 In follow-up today he presents with family  Off oxygen  Denies any recent acute on chronic respiratory distress episodes  Quit smoking several years ago Wife continues to smoke Legs hurt when he walks Trying to get established with the vascular clinic at the [facility] Weight stable  Off torsemide , no edema, was previously taking QOD in 2024 [facility] team previously stopped entresto /jardiance  hoping this would help his fatigue and low blood pressure  Denies orthostasis symptoms Frequent PVCs on EKG today  Prior history of GERD issues, has completed EGD/colo December 2023 with Dr. Jinny, no significant pathology noted Prior history of dysphagia  EKG personally reviewed by myself on todays visit EKG Interpretation Date/Time:  Friday August 29 2024 14:17:40 EDT Ventricular Rate:  82 PR Interval:  186 QRS Duration:  96 QT Interval:  382 QTC Calculation: 446 R Axis:   -22  Text Interpretation: Sinus rhythm with frequent Premature ventricular complexes in a pattern of bigeminy Septal infarct , age undetermined When compared with ECG of 21-Sep-2023 11:47, Premature ventricular complexes are now Present Premature atrial complexes are no longer Present Vent. rate has increased BY  31 BPM Confirmed by Perla Lye [phone]) on  08/29/2024 2:37:34 PM    Other past medical history reviewed June 2023 long hospitalization cardiac arrest secondary to respiratory failure pneumonia, cardiomyopathy, acute on chronic systolic CHF Ejection fraction estimated 35 to 40% on echo Treated with aggressive diuresis  Cardiac catheterization May 16, 2022 Moderate LAD disease, 60% lesion no intervention performed Moderate pulmonary hypertension  Profound weakness during his hospitalization concerning for myasthenia gravis flare, treated with IVIG  Was discharged to [facility]  Last medical history reviewed [facility]  12/2018 for marked weakness consistent with myasthenia crisis. He underwent plasma exchange and was noted to have episodes of NSVT.    PMH:   has a past medical history of Anxiety, Arthritis, Cardiac arrest due to respiratory disorder (HCC) (04/30/2022), CHF (congestive heart failure) (HCC), COPD (chronic obstructive pulmonary disease) (HCC), Diabetes mellitus without complication (HCC), Dyspnea, Hyperlipidemia, Hypertension, Myasthenia gravis (HCC), Oxygen  deficit, Polyp of ascending colon (11/30/2022), and Ventricular tachycardia (HCC).  PSH:    Past Surgical History:  Procedure Laterality Date   carpel tunnel sx     CATARACT EXTRACTION     right eye   CATARACT EXTRACTION W/PHACO Left 12/01/2020   Procedure: CATARACT EXTRACTION PHACO AND INTRAOCULAR LENS PLACEMENT (IOC) LEFT 10.83 01:14.1 14.6%;  Surgeon: Mittie Gaskin, MD;  Location: [facility];  Service: Ophthalmology;  Laterality: Left;   COLONOSCOPY WITH PROPOFOL  N/A 11/30/2022   Procedure: COLONOSCOPY WITH PROPOFOL ;  Surgeon: Jinny Carmine, MD;  Location: [facility];  Service: Endoscopy;  Laterality: N/A;   CORONARY PRESSURE/FFR STUDY N/A 05/16/2022   Procedure: INTRAVASCULAR PRESSURE WIRE/FFR STUDY;  Surgeon: [name] Bruckner, MD;  Location: [facility];  Service: Cardiovascular;  Laterality: N/A;   ESOPHAGOGASTRODUODENOSCOPY  N/A 11/30/2022   Procedure: ESOPHAGOGASTRODUODENOSCOPY (EGD);  Surgeon: Jinny Carmine, MD;  Location: [facility] ENDOSCOPY;  Service: Endoscopy;  Laterality: N/A;   EYE SURGERY     RETINAL DETACHMENT SURGERY     RIGHT/LEFT HEART CATH AND CORONARY ANGIOGRAPHY N/A 05/16/2022   Procedure: RIGHT/LEFT HEART CATH AND CORONARY ANGIOGRAPHY;  Surgeon: [name] Bruckner, MD;  Location: [facility] INVASIVE CV LAB;  Service: Cardiovascular;  Laterality: N/A;   SKIN GRAFT     Behind left knee   TONSILLECTOMY AND ADENOIDECTOMY      Current Outpatient Medications  Medication Sig Dispense Refill   albuterol  (PROVENTIL  HFA;VENTOLIN  HFA) 108 (90 Base) MCG/ACT inhaler Inhale into the lungs every 6 (six) hours as needed for wheezing or shortness of breath.     apixaban  (ELIQUIS ) 5 MG TABS tablet Take 5 mg by mouth 2 (two) times daily.     budesonide -formoterol  (SYMBICORT ) 160-4.5 MCG/ACT inhaler Inhale 2 puffs into the lungs 2 (two) times daily. 1 Inhaler 0   cholecalciferol  (VITAMIN D ) 25 MCG (1000 UNIT) tablet Take 2,000 Units by mouth daily.     diazepam  (VALIUM ) 5 MG tablet Take 5 mg by mouth every 6 (six) hours as needed for anxiety.     digoxin  (LANOXIN ) 0.125 MG tablet Take 1 tablet (0.125 mg total) by mouth daily.     empagliflozin  (JARDIANCE ) 25 MG TABS tablet Take 25 mg by mouth daily.     famotidine (PEPCID) 20 MG tablet Take 20 mg by mouth as needed.     HYDROcodone -acetaminophen  (NORCO) 7.5-325 MG tablet Take 1 tablet by mouth every 6 (six) hours as needed for moderate pain (pain score 4-6). 120 tablet 0   metFORMIN (GLUCOPHAGE) 500 MG tablet Take 500 mg by mouth 2 (two) times daily with a meal.     metoprolol  succinate (TOPROL -XL) 25 MG 24 hr tablet Take 12.5 mg by mouth daily.     OLANZapine  zydis (ZYPREXA ) 5 MG disintegrating tablet Take 1 tablet (5 mg total) by mouth 2 (two) times daily. 60 tablet 5   omeprazole  (PRILOSEC) 40 MG capsule Take 1 capsule (40 mg total) by mouth 2 (two) times daily. 60 capsule 3    pravastatin  (PRAVACHOL ) 20 MG tablet Take 20 mg by mouth daily.     predniSONE  (DELTASONE ) 5 MG tablet Take 10 mg by mouth as directed.     pyridostigmine  (MESTINON ) 60 MG tablet Take 1 tablet (60 mg total) by mouth 3 (three) times daily. 90 tablet 3   spironolactone (ALDACTONE) 25 MG tablet Take 12.5 mg by mouth daily.     terbinafine  (LAMISIL ) 250 MG tablet Take 250 mg by mouth daily.     tiotropium (SPIRIVA ) 18 MCG inhalation capsule Place 18 mcg into inhaler and inhale daily.     vitamin B-12 (CYANOCOBALAMIN ) 1000 MCG tablet Take 1,000 mcg by mouth daily.     No current facility-administered medications for this visit.    Allergies:   Lisinopril   Social History:  The patient  reports that he quit smoking about 7 years ago. His smoking use included pipe and cigarettes. He started smoking about 47 years ago. He has a 20 pack-year smoking history. He quit smokeless tobacco use about 7 years ago. He reports current alcohol use. He reports that he does not use drugs.   Family History:   family history includes Arthritis in his father, mother, and another family member; Heart attack in  his father; Heart disease (age of onset: 90) in his father; Hypertension in his mother; Kidney disease in his mother; Stroke in his father; Thyroid  disease in his mother.   Review of Systems: Review of Systems  Constitutional: Negative.   HENT: Negative.    Respiratory: Negative.    Cardiovascular: Negative.   Gastrointestinal: Negative.   Musculoskeletal: Negative.   Neurological: Negative.   Psychiatric/Behavioral: Negative.    All other systems reviewed and are negative.  PHYSICAL EXAM: VS:  BP 118/76   Pulse 69   Ht 5' 7 (1.702 m)   Wt 189 lb 9.6 oz (86 kg)   SpO2 96%   BMI 29.70 kg/m  , BMI Body mass index is 29.7 kg/m. Constitutional:  oriented to person, place, and time. No distress.  HENT:  Head: Grossly normal Eyes:  no discharge. No scleral icterus.  Neck: No JVD, no carotid  bruits  Cardiovascular: Ectopy appreciated, otherwise regular rate and rhythm, no murmurs appreciated Pulmonary/Chest: Clear to auscultation bilaterally, no wheezes or rails Abdominal: Soft.  no distension.  no tenderness.  Musculoskeletal: Normal range of motion Neurological:  normal muscle tone. Coordination normal. No atrophy Skin: Skin warm and dry Psychiatric: normal affect, pleasant  Recent Labs: 09/14/2023: Magnesium  1.9    Lipid Panel Lab Results  Component Value Date   CHOL 141 04/03/2022   HDL 62 04/03/2022   LDLCALC 62 04/03/2022   TRIG 92 04/03/2022  Home  Wt Readings from Last 3 Encounters:  08/29/24 189 lb 9.6 oz (86 kg)  12/25/23 197 lb (89.4 kg)  12/18/23 197 lb 1.6 oz (89.4 kg)     ASSESSMENT AND PLAN:  Problem List Items Addressed This Visit       Cardiology Problems   Chronic heart failure with preserved ejection fraction (HFpEF) (HCC) - Primary   Relevant Orders   EKG 12-Lead (Completed)     Other   Morbid obesity (HCC) (Chronic)   Relevant Orders   EKG 12-Lead (Completed)   COPD (chronic obstructive pulmonary disease) (HCC)   Relevant Orders   EKG 12-Lead (Completed)   Type 2 diabetes mellitus (HCC)   Relevant Orders   EKG 12-Lead (Completed)   Myasthenia gravis (HCC)   Relevant Orders   EKG 12-Lead (Completed)   Other Visit Diagnoses       Essential hypertension       Relevant Orders   EKG 12-Lead (Completed)     Lymphedema       Relevant Orders   EKG 12-Lead (Completed)     Ventricular tachycardia (HCC)       Relevant Orders   EKG 12-Lead (Completed)     Hyperlipidemia, unspecified hyperlipidemia type       Relevant Orders   EKG 12-Lead (Completed)     Hypertrophic cardiomyopathy (HCC)       Relevant Orders   EKG 12-Lead (Completed)     Sinus bradycardia       Relevant Orders   EKG 12-Lead (Completed)       Chronic respiratory distress Long history of smoking, underlying COPD Muscle weakness from myasthenia  gravis Feels his symptoms are stable, off oxygen  -Pulmonary hypertension on catheterization Off diuretic -Off Jardiance  and Entresto  per cardiology at the [facility] Stable symptoms  Shortness of breath/chronic diastolic and systolic CHF Exacerbated by morbid obesity,  myasthenia gravis, cardiomyopathy Symptoms exacerbated by severe lung disease Off oxygen  Remains on digoxin , Jardiance , metoprolol  succinate, spironolactone  Morbid obesity Weight stable  Myasthenia gravis Remains on prednisone  10  mg daily Prior treatment IVIG in the hospital  Sustained VT Followed by EP  Paroxysmal atrial fibrillation On Eliquis , maintaining normal sinus rhythm Recommend he continue current dose of metoprolol  succinate 12.5 daily  Leg pain Managed by Dr. Gasper Neuropathy Try to get in with vascular at the [facility]  GERD On omeprazole  daily  Fatigue Recommend regular walking program    Signed, Velinda Lunger, M.D., Ph.D. [facility] [place], [place] [phone]

## 2024-08-29 NOTE — Patient Instructions (Signed)

## 2024-10-21 DIAGNOSIS — H40002 Preglaucoma, unspecified, left eye: Secondary | ICD-10-CM | POA: Diagnosis not present

## 2024-10-28 LAB — OPHTHALMOLOGY REPORT-SCANNED

## 2024-10-30 DIAGNOSIS — H348192 Central retinal vein occlusion, unspecified eye, stable: Secondary | ICD-10-CM | POA: Diagnosis not present

## 2024-10-30 DIAGNOSIS — H401111 Primary open-angle glaucoma, right eye, mild stage: Secondary | ICD-10-CM | POA: Diagnosis not present

## 2024-10-30 DIAGNOSIS — Z961 Presence of intraocular lens: Secondary | ICD-10-CM | POA: Diagnosis not present

## 2024-10-30 DIAGNOSIS — H40002 Preglaucoma, unspecified, left eye: Secondary | ICD-10-CM | POA: Diagnosis not present

## 2024-11-03 ENCOUNTER — Other Ambulatory Visit: Payer: Self-pay | Admitting: Family Medicine

## 2024-11-03 DIAGNOSIS — M791 Myalgia, unspecified site: Secondary | ICD-10-CM

## 2024-11-03 DIAGNOSIS — M2550 Pain in unspecified joint: Secondary | ICD-10-CM

## 2024-11-03 NOTE — Telephone Encounter (Unsigned)
 Copied from CRM #[id number]. Topic: Clinical - Medication Refill >> Nov 03, 2024  3:58 PM Montie POUR wrote: Medication:  HYDROcodone -acetaminophen  (NORCO) 7.5-325 MG tablet  Has the patient contacted their pharmacy? Yes (Agent: If no, request that the patient contact the pharmacy for the refill. If patient does not wish to contact the pharmacy document the reason why and proceed with request.) (Agent: If yes, when and what did the pharmacy advise?) Pharmacy needs order to refill  This is the patient's preferred pharmacy:  [facility] [id number] [place], [place] - [address] [place] [address] Phone: [phone] Fax: [phone]  Is this the correct pharmacy for this prescription? Yes If no, delete pharmacy and type the correct one.   Has the prescription been filled recently? No  Is the patient out of the medication? Yes  Has the patient been seen for an appointment in the last year OR does the patient have an upcoming appointment? Yes  Can we respond through MyChart? No - Please call Daughter, Reda with any questions.   Agent: Please be advised that Rx refills may take up to 3 business days. We ask that you follow-up with your pharmacy.

## 2024-11-04 NOTE — Telephone Encounter (Signed)
 Called patient daughter on DPR to schedule appt for medication refills. Last OV 12/25/23.

## 2024-11-04 NOTE — Telephone Encounter (Signed)
 Requested medication (s) are due for refill today: na   Requested medication (s) are on the active medication list: yes   Last refill:  08/06/24 #120 0 refills  Future visit scheduled: yes 12/01/24  Notes to clinic:  not delegated per protocol. Do you want to refill Rx?     Requested Prescriptions  Pending Prescriptions Disp Refills   HYDROcodone -acetaminophen  (NORCO) 7.5-325 MG tablet 120 tablet 0    Sig: Take 1 tablet by mouth every 6 (six) hours as needed for moderate pain (pain score 4-6).     Not Delegated - Analgesics:  Opioid Agonist Combinations Failed - 11/04/2024  3:49 PM      Failed - This refill cannot be delegated      Failed - Urine Drug Screen completed in last 360 days      Failed - Valid encounter within last 3 months    Recent Outpatient Visits   None

## 2024-11-05 MED ORDER — HYDROCODONE-ACETAMINOPHEN 7.5-325 MG PO TABS: 1.0000 | ORAL_TABLET | Freq: Four times a day (QID) | ORAL | 0 refills | Status: DC | PRN

## 2024-11-24 ENCOUNTER — Telehealth: Payer: Self-pay | Admitting: Cardiovascular Disease

## 2024-11-24 NOTE — Telephone Encounter (Signed)
 Pt daughter called in to ask if pt can have a referral to [facility]. Please advise.

## 2024-11-25 NOTE — Telephone Encounter (Signed)
 Returned to call to pt's daughter, Reda; verified pt's identity using two identifiers; daughter states that at last office visit with Dr. [name] there was a discussion about getting a referral for pt to see a vein and vascular specialist because pt has pain and discoloration in his lower extremities, especially below his knees; pt had U/S testing done at the [facility] which shows decreased circulation, but it going to take months for patient to be seen and she was wanting a referral to be made locally.  I explained that we have a physician here in clinic that sees vascular patients, Dr. Darron, but she was thinking that [facility] is the name of the clinic that was discussed with Dr. [name].  I explained that I would send Dr. [name] a note with her concerns and questions and once we heard back from Dr. [name] we would reach back out to her to let her know where referral was sent.  All questions answered at this time; pt verbalized understanding and thanked me for the return call.

## 2024-11-25 NOTE — Telephone Encounter (Signed)
 Pt daughter returning call . Please advise

## 2024-11-25 NOTE — Telephone Encounter (Signed)
 Attempted to return pt's daughter phone call requesting a referral to vein and vascular.  Need more information prior to sending this request to Dr. [name].  Left message for daughter to return my call or send a Message on MyChart if she has permission to access pt's chart.

## 2024-11-26 ENCOUNTER — Ambulatory Visit: Admitting: Family Medicine

## 2024-11-28 NOTE — Addendum Note (Signed)
 Addended by: CRISTOPHER OLIVIA PARAS on: 11/28/2024 08:31 AM   Modules accepted: Orders

## 2024-11-28 NOTE — Telephone Encounter (Signed)
 Called patient and notified him of the following.   Without seeing the results, unclear where to direct him Perhaps they can get us  the results for review For arterial blockages, they can see Dr. Darron in our office or vein and vascular on the second floor We can put in referral wherever they want to be seen   Thx TGollan  Patient verbalizes understanding. Referral placed for Dr. Darron.

## 2024-12-01 ENCOUNTER — Encounter: Payer: Self-pay | Admitting: Family Medicine

## 2024-12-01 ENCOUNTER — Ambulatory Visit: Admitting: Family Medicine

## 2024-12-01 VITALS — BP 117/76 | HR 74 | Ht 67.0 in | Wt 195.8 lb

## 2024-12-01 DIAGNOSIS — M545 Low back pain, unspecified: Secondary | ICD-10-CM | POA: Diagnosis not present

## 2024-12-01 DIAGNOSIS — I5032 Chronic diastolic (congestive) heart failure: Secondary | ICD-10-CM | POA: Diagnosis not present

## 2024-12-01 DIAGNOSIS — E1159 Type 2 diabetes mellitus with other circulatory complications: Secondary | ICD-10-CM | POA: Diagnosis not present

## 2024-12-01 DIAGNOSIS — E1169 Type 2 diabetes mellitus with other specified complication: Secondary | ICD-10-CM | POA: Diagnosis not present

## 2024-12-01 MED ORDER — OXYCODONE-ACETAMINOPHEN 7.5-325 MG PO TABS: 1.0000 | ORAL_TABLET | ORAL | 0 refills | Status: AC | PRN

## 2024-12-01 NOTE — Progress Notes (Signed)
 "     Established patient visit   Patient: Joseph Macdonald   DOB: 15-Oct-1946   78 y.o. Male  MRN: [id number] Visit Date: 12/01/2024  Today's healthcare provider: Nancyann Perry, MD   Chief Complaint  Patient presents with   Medication Refill    Leg concern-medication refills/ RN- Triage / wanting to know if there is another medication he can take because it doesn't seem to be helping and reports they are aching pretty bad at the moment   Subjective    Discussed the use of AI scribe software for clinical note transcription with the patient, who gave verbal consent to proceed.  History of Present Illness   Joseph Macdonald is a 78 year old male who presents with chronic leg pain and circulation issues. He is accompanied by his primary caregiver.  He has chronic aching pain in both legs, with the right leg being more affected than the left. The pain is primarily located down the front of his legs and does not radiate to his feet or down the back of his legs. It remains constant throughout the day and does not worsen with walking.  He has undergone two ultrasound tests on his legs due to changes in leg color and weak pulses in his feet. He has not yet received results from his primary care provider at the [facility].  He experiences difficulty resting due to the pain, despite being inactive. He is currently receiving physical therapy once a month at the [facility] and is awaiting a referral for outpatient physical therapy at a local hospital.  He has a history of intermittent back pain, which is not constant. He believes he has had x-rays of his back in the past, but none since returning home in January.  He is currently taking hydrocodone  twice a day and Tylenol  the rest of the day, but the hydrocodone  is not effectively managing his pain. He also takes duloxetine, two pills at night, for restless legs, which has provided some relief. He previously tried Nervive but discontinued it due to concerns  about its interaction with his myasthenia gravis.  No pain in his arms or shoulders.       Medications: Show/hide medication list[1] Review of Systems     Objective    BP 117/76 (BP Location: Left Arm, Patient Position: Sitting, Cuff Size: Normal)   Pulse 74   Ht 5' 7 (1.702 m)   Wt 195 lb 12.8 oz (88.8 kg)   SpO2 97%   BMI 30.67 kg/m   Physical Exam   General: Appearance:    Obese male in no acute distress  Eyes:    PERRL, conjunctiva/corneas clear, EOM's intact       Lungs:     Clear to auscultation bilaterally, respirations unlabored  Heart:    Normal heart rate. Regular rhythm. No murmurs, rubs, or gallops.    MS:   All extremities are intact.    Neurologic:   Awake, alert, oriented x 3. No apparent focal neurological defect.         Assessment & Plan        Peripheral vascular disease Chronic leg pain, more severe in the right leg, with poor circulation indicated by light pulse and color changes. Awaiting ultrasound results and vascular clinic appointment. - Await contact from vein vascular clinic for appointment. - Continue outpatient physical therapy at the [facility]. - Switched hydrocodone  to oxycodone  for better pain management.  Low back pain Intermittent pain, not exacerbated  by walking. Previous x-rays outdated. - Ordered updated x-rays of the back at the outpatient imaging center.   Chronic heart failure with preserved ejection fraction (HFpEF) (HCC) (Primary) Stable, Continue routine follow up cardiology at [facility].   Acute midline low back pain, unspecified whether sciatica present  - DG Lumbar Spine Complete; Future  Change hydrocodone /apap to oxyCODONE -acetaminophen  (PERCOCET) 7.5-325 MG tablet; Take 1 tablet by mouth every 4 (four) hours as needed for severe pain (pain score 7-10).  Dispense: 60 tablet; Refill: 0  Type 2 diabetes mellitus with other specified complication, without long-term current use of insulin  (HCC) Associated with hypertension,  hyperlipidemia, and CHF. Stable on current mediciton regiment. Continue routine follow up at [facility].       Nancyann Perry, MD  [facility] [phone] (phone) [phone] (fax)  [facility]    [1]  Outpatient Medications Prior to Visit  Medication Sig   albuterol  (PROVENTIL  HFA;VENTOLIN  HFA) 108 (90 Base) MCG/ACT inhaler Inhale into the lungs every 6 (six) hours as needed for wheezing or shortness of breath.   apixaban  (ELIQUIS ) 5 MG TABS tablet Take 5 mg by mouth 2 (two) times daily.   budesonide -formoterol  (SYMBICORT ) 160-4.5 MCG/ACT inhaler Inhale 2 puffs into the lungs 2 (two) times daily.   cholecalciferol  (VITAMIN D ) 25 MCG (1000 UNIT) tablet Take 2,000 Units by mouth daily.   diazepam  (VALIUM ) 5 MG tablet Take 5 mg by mouth every 6 (six) hours as needed for anxiety.   digoxin  (LANOXIN ) 0.125 MG tablet Take 1 tablet (0.125 mg total) by mouth daily.   DULOXETINE HCL PO Take 2 tablets by mouth daily.   empagliflozin  (JARDIANCE ) 25 MG TABS tablet Take 25 mg by mouth daily.   famotidine (PEPCID) 20 MG tablet Take 20 mg by mouth as needed.   metFORMIN (GLUCOPHAGE) 500 MG tablet Take 500 mg by mouth 2 (two) times daily with a meal.   metoprolol  succinate (TOPROL -XL) 25 MG 24 hr tablet Take 12.5 mg by mouth daily.   OLANZapine  zydis (ZYPREXA ) 5 MG disintegrating tablet Take 1 tablet (5 mg total) by mouth 2 (two) times daily.   omeprazole  (PRILOSEC) 40 MG capsule Take 1 capsule (40 mg total) by mouth 2 (two) times daily.   pravastatin  (PRAVACHOL ) 20 MG tablet Take 20 mg by mouth daily.   predniSONE  (DELTASONE ) 5 MG tablet Take 10 mg by mouth as directed.   pyridostigmine  (MESTINON ) 60 MG tablet Take 1 tablet (60 mg total) by mouth 3 (three) times daily.   spironolactone (ALDACTONE) 25 MG tablet Take 12.5 mg by mouth daily.   terbinafine  (LAMISIL ) 250 MG tablet Take 250 mg by mouth daily.   tiotropium (SPIRIVA ) 18 MCG inhalation capsule Place 18 mcg  into inhaler and inhale daily.   vitamin B-12 (CYANOCOBALAMIN ) 1000 MCG tablet Take 1,000 mcg by mouth daily.   [DISCONTINUED] HYDROcodone -acetaminophen  (NORCO) 7.5-325 MG tablet Take 1 tablet by mouth every 6 (six) hours as needed for moderate pain (pain score 4-6).   No facility-administered medications prior to visit.   "

## 2024-12-01 NOTE — Patient Instructions (Signed)
 SABRA  Please review the attached list of medications and notify my office if there are any errors.   . Please bring all of your medications to every appointment so we can make sure that our medication list is the same as yours.

## 2024-12-17 ENCOUNTER — Ambulatory Visit: Attending: Cardiovascular Disease | Admitting: Cardiovascular Disease

## 2024-12-17 ENCOUNTER — Encounter: Payer: Self-pay | Admitting: Cardiovascular Disease

## 2024-12-17 VITALS — BP 100/70 | HR 80 | Ht 67.0 in | Wt 196.0 lb

## 2024-12-17 DIAGNOSIS — I5032 Chronic diastolic (congestive) heart failure: Secondary | ICD-10-CM | POA: Diagnosis not present

## 2024-12-17 DIAGNOSIS — E785 Hyperlipidemia, unspecified: Secondary | ICD-10-CM

## 2024-12-17 DIAGNOSIS — I10 Essential (primary) hypertension: Secondary | ICD-10-CM | POA: Diagnosis not present

## 2024-12-17 DIAGNOSIS — I480 Paroxysmal atrial fibrillation: Secondary | ICD-10-CM

## 2024-12-17 DIAGNOSIS — I2510 Atherosclerotic heart disease of native coronary artery without angina pectoris: Secondary | ICD-10-CM | POA: Diagnosis not present

## 2024-12-17 DIAGNOSIS — I739 Peripheral vascular disease, unspecified: Secondary | ICD-10-CM | POA: Diagnosis not present

## 2024-12-17 NOTE — Progress Notes (Signed)
 "    Cardiology Office Note   Date:  12/17/2024   ID:  Joseph Macdonald, DOB 06/02/1946, MRN [id number]  PCP:  Joseph Nancyann BRAVO, MD  Cardiologist:  Dr. Perla  Chief Complaint  Patient presents with   New Patient (Initial Visit)    PAD c/o bilateral leg throbbing and pain. Meds reviewed verbally with pt.      History of Present Illness: Joseph Macdonald is a 79 y.o. male who was referred by Dr. [name] for evaluation and management of peripheral arterial disease. He has known history of ventricular tachycardia, myasthenia gravis requiring plasma exchange in 2020, chronic diastolic heart failure, essential hypertension, COPD on oxygen , previous tobacco use, hyperlipidemia, paroxysmal atrial fibrillation and moderate nonobstructive coronary artery disease.  He reports chronic back pain with no previous back surgery.  He quit smoking in 2018. He has poor balance and uses a walker on a regular basis.  He reports continuous bilateral leg pain worse on the right side which actually improves with walking.  No lower extremity ulceration.  In addition, he noticed some dark discoloration from the knees down.  He has mild swelling in both legs.    Past Medical History:  Diagnosis Date   Anxiety    Arthritis    Cardiac arrest due to respiratory disorder (HCC) 04/30/2022   CHF (congestive heart failure) (HCC)    COPD (chronic obstructive pulmonary disease) (HCC)    Diabetes mellitus without complication (HCC)    diet controlled   Dyspnea    with activity   Hyperlipidemia    Hypertension    Myasthenia gravis (HCC)    Oxygen  deficit    Polyp of ascending colon 11/30/2022   Ventricular tachycardia [facility])     Past Surgical History:  Procedure Laterality Date   carpel tunnel sx     CATARACT EXTRACTION     right eye   CATARACT EXTRACTION W/PHACO Left 12/01/2020   Procedure: CATARACT EXTRACTION PHACO AND INTRAOCULAR LENS PLACEMENT (IOC) LEFT 10.83 01:14.1 14.6%;  Surgeon: Joseph Gaskin, MD;  Location: [facility];  Service: Ophthalmology;  Laterality: Left;   COLONOSCOPY WITH PROPOFOL  N/A 11/30/2022   Procedure: COLONOSCOPY WITH PROPOFOL ;  Surgeon: Joseph Carmine, MD;  Location: [facility] ENDOSCOPY;  Service: Endoscopy;  Laterality: N/A;   CORONARY PRESSURE/FFR STUDY N/A 05/16/2022   Procedure: INTRAVASCULAR PRESSURE WIRE/FFR STUDY;  Surgeon: Joseph Bruckner, MD;  Location: [facility] INVASIVE CV LAB;  Service: Cardiovascular;  Laterality: N/A;   ESOPHAGOGASTRODUODENOSCOPY N/A 11/30/2022   Procedure: ESOPHAGOGASTRODUODENOSCOPY (EGD);  Surgeon: Joseph Carmine, MD;  Location: [facility] END[facility];  Service: Endoscopy;  Laterality: N/A;   EYE SURGERY  12/16/2019   RETINAL DETACHMENT SURGERY     RIGHT/LEFT HEART CATH AND CORONARY ANGIOGRAPHY N/A 05/16/2022   Procedure: RIGHT/LEFT HEART CATH AND CORONARY ANGIOGRAPHY;  Surgeon: Joseph Bruckner, MD;  Location: [facility] INVASIVE CV LAB;  Service: Cardiovascular;  Laterality: N/A;   SKIN GRAFT     Behind left knee   TONSILLECTOMY AND ADENOIDECTOMY       Current Outpatient Medications  Medication Sig Dispense Refill   albuterol  (PROVENTIL  HFA;VENTOLIN  HFA) 108 (90 Base) MCG/ACT inhaler Inhale into the lungs every 6 (six) hours as needed for wheezing or shortness of breath.     apixaban  (ELIQUIS ) 5 MG TABS tablet Take 5 mg by mouth 2 (two) times daily.     budesonide -formoterol  (SYMBICORT ) 160-4.5 MCG/ACT inhaler Inhale 2 puffs into the lungs 2 (two) times daily. 1 Inhaler 0   cholecalciferol  (VITAMIN D ) 25  MCG (1000 UNIT) tablet Take 2,000 Units by mouth daily.     diazepam  (VALIUM ) 5 MG tablet Take 5 mg by mouth every 6 (six) hours as needed for anxiety.     digoxin  (LANOXIN ) 0.125 MG tablet Take 1 tablet (0.125 mg total) by mouth daily.     DULOXETINE HCL PO Take 2 tablets by mouth daily.     empagliflozin  (JARDIANCE ) 25 MG TABS tablet Take 25 mg by mouth daily.     famotidine (PEPCID) 20 MG tablet Take 20 mg by mouth as needed.      metFORMIN (GLUCOPHAGE) 500 MG tablet Take 500 mg by mouth 2 (two) times daily with a meal.     metoprolol  succinate (TOPROL -XL) 25 MG 24 hr tablet Take 12.5 mg by mouth daily.     OLANZapine  zydis (ZYPREXA ) 5 MG disintegrating tablet Take 1 tablet (5 mg total) by mouth 2 (two) times daily. 60 tablet 5   omeprazole  (PRILOSEC) 40 MG capsule Take 1 capsule (40 mg total) by mouth 2 (two) times daily. 60 capsule 3   oxyCODONE -acetaminophen  (PERCOCET) 7.5-325 MG tablet Take 1 tablet by mouth every 4 (four) hours as needed for severe pain (pain score 7-10). 60 tablet 0   pravastatin  (PRAVACHOL ) 20 MG tablet Take 20 mg by mouth daily.     predniSONE  (DELTASONE ) 2.5 MG tablet Take 2.5 mg by mouth daily with breakfast.     pyridostigmine  (MESTINON ) 60 MG tablet Take 1 tablet (60 mg total) by mouth 3 (three) times daily. 90 tablet 3   spironolactone (ALDACTONE) 25 MG tablet Take 12.5 mg by mouth daily.     terbinafine  (LAMISIL ) 250 MG tablet Take 250 mg by mouth daily.     tiotropium (SPIRIVA ) 18 MCG inhalation capsule Place 18 mcg into inhaler and inhale daily.     vitamin B-12 (CYANOCOBALAMIN ) 1000 MCG tablet Take 1,000 mcg by mouth daily.     predniSONE  (DELTASONE ) 5 MG tablet Take 10 mg by mouth as directed. (Patient not taking: Reported on 12/17/2024)     No current facility-administered medications for this visit.    Allergies:   Lisinopril    Social History:  The patient  reports that he quit smoking about 7 years ago. His smoking use included pipe and cigarettes. He started smoking about 47 years ago. He has a 20 pack-year smoking history. He quit smokeless tobacco use about 7 years ago. He reports current alcohol use. He reports that he does not use drugs.   Family History:  The patient's family history includes Arthritis in his father, mother, and another family member; Heart attack in his father; Heart disease (age of onset: 57) in his father; Hypertension in his mother; Kidney disease in his  mother; Stroke in his father; Thyroid  disease in his mother.    ROS:  Please see the history of present illness.   Otherwise, review of systems are positive for none.   All other systems are reviewed and negative.    PHYSICAL EXAM: VS:  BP 100/70 (BP Location: Left Arm, Patient Position: Sitting, Cuff Size: Normal)   Pulse 80   Ht 5' 7 (1.702 m)   Wt 196 lb (88.9 kg)   SpO2 97%   BMI 30.70 kg/m  , BMI Body mass index is 30.7 kg/m. GEN: Well nourished, well developed, in no acute distress  HEENT: normal  Neck: no JVD, carotid bruits, or masses Cardiac: RRR; no murmurs, rubs, or gallops,no edema  Respiratory:  clear to auscultation bilaterally,  normal work of breathing GI: soft, nontender, nondistended, + BS MS: no deformity or atrophy  Skin: warm and dry, no rash Neuro:  Strength and sensation are intact Psych: euthymic mood, full affect Vascular: Femoral pulses normal bilaterally.  Distal pulses are not palpable on the right and +1 on the left.   EKG:  EKG is not ordered today.    Recent Labs: No results found for requested labs within last 365 days.    Lipid Panel    Component Value Date/Time   CHOL 141 04/03/2022 1210   TRIG 92 04/03/2022 1210   HDL 62 04/03/2022 1210   CHOLHDL 2.3 04/03/2022 1210   LDLCALC 62 04/03/2022 1210      Wt Readings from Last 3 Encounters:  12/17/24 196 lb (88.9 kg)  12/01/24 195 lb 12.8 oz (88.8 kg)  08/29/24 189 lb 9.6 oz (86 kg)          03/12/2019   10:32 AM  PAD Screen  Previous PAD dx? No  Previous surgical procedure? No  Pain with walking? Yes  Subsides with rest? Yes  Feet/toe relief with dangling? No  Painful, non-healing ulcers? No  Extremities discolored? Yes      ASSESSMENT AND PLAN:  1.  Peripheral arterial disease: He has evidence of peripheral arterial disease especially on the right side.  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 His femoral pulses are normal bilaterally and thus he is unlikely to have iliac disease. Cilostazol is not a good option given that he is on anticoagulation with Eliquis  and has history of heart failure. I do not have the results of his noninvasive vascular testing done at the [facility] and I requested those.  The family have the results at home and will bring to me to review. If his ABI is significantly reduced, he benefits from a supervised exercise therapy program.  He is agreeable to participate. The patient was educated on risk factors surrounding PAD. I discussed the importance of controlling risk factors and importance of regular exercise to help reduce pain. I discussed the effects of exercise on reducing claudications and improving his risk factors including hyperlipidemia and hypertension.    2.  Chronic diastolic heart failure: He appears to be euvolemic.  3.  Coronary artery disease involving native coronary arteries without angina: Continue medical therapy.  4.  Essential hypertension: Blood pressure is controlled.  5.  Hyperlipidemia: Currently on pravastatin  20 mg once daily.  Recommended target LDL of less than 55.  6.  Paroxysmal atrial fibrillation: On anticoagulation with Eliquis     Disposition:   FU with me in 3 months  Signed,  Deatrice Cage, MD  12/17/2024 2:28 PM    [facility] "

## 2024-12-17 NOTE — Patient Instructions (Signed)
 Medication Instructions:  Your physician recommends that you continue on your current medications as directed. Please refer to the Current Medication list given to you today.   *If you need a refill on your cardiac medications before your next appointment, please call your pharmacy*  Lab Work: No labs ordered today  If you have labs (blood work) drawn today and your tests are completely normal, you will receive your results only by: MyChart Message (if you have MyChart) OR A paper copy in the mail If you have any lab test that is abnormal or we need to change your treatment, we will call you to review the results.  Testing/Procedures: No test ordered today   Follow-Up: At [facility], you and your health needs are our priority.  As part of our continuing mission to provide you with exceptional heart care, our providers are all part of one team.  This team includes your primary Cardiologist (physician) and Advanced Practice Providers or APPs (Physician Assistants and Nurse Practitioners) who all work together to provide you with the care you need, when you need it.  Your next appointment:   3 month(s)  Provider:   You may see Deatrice Cage, MD or one of the following Advanced Practice Providers on your designated Care Team:   Lonni Meager, NP Lesley Maffucci, PA-C Bernardino Bring, PA-C Cadence Campbell, PA-C Tylene Lunch, NP Barnie Hila, NP   We recommend signing up for the patient portal called MyChart.  Sign up information is provided on this After Visit Summary.  MyChart is used to connect with patients for Virtual Visits (Telemedicine).  Patients are able to view lab/test results, encounter notes, upcoming appointments, etc.  Non-urgent messages can be sent to your provider as well.   To learn more about what you can do with MyChart, go to [email or web address].

## 2024-12-30 ENCOUNTER — Telehealth: Payer: Self-pay | Admitting: Cardiovascular Disease

## 2024-12-30 NOTE — Telephone Encounter (Signed)
 Patient brought by medical records from the [facility] of his ultrasound results. Copy of the medical records were placed in Dr. Renato nurse box.

## 2025-01-02 NOTE — Telephone Encounter (Signed)
 Called patient and left detailed message per DPR. Left message of the following from Dr. Darron.  Please let him know that I reviewed the studies.  He does have calcified vessels but overall no evidence of decreased blood flow.  Ultrasound images did not show significant blockages.  Follow-up as planned.     Asked patient to call back with any questions.

## 2025-01-02 NOTE — Telephone Encounter (Signed)
 Please let him know that I reviewed the studies.  He does have calcified vessels but overall no evidence of decreased blood flow.  Ultrasound images did not show significant blockages.  Follow-up as planned.

## 2025-01-13 NOTE — Telephone Encounter (Signed)
 Patient daughter called to get an update on results please advise

## 2025-01-13 NOTE — Telephone Encounter (Signed)
 Called patient's daughter - reviewed result note with her - daughter says he just lays on the couch and has pain in his legs saying he can't get up, daughter is wanting to know if there is anything - PT locally? - that he can do to help with overall health  Thank you - Jon

## 2025-01-15 NOTE — Telephone Encounter (Signed)
 Given that no significant blockages were noted by ultrasound, he does not qualify for vascular rehab. He should check with his primary care physician about physical therapy referral for his back and leg situation.

## 2025-03-05 ENCOUNTER — Ambulatory Visit: Admitting: Cardiovascular Disease

## 2025-03-25 ENCOUNTER — Ambulatory Visit
